# Patient Record
Sex: Male | Born: 1945 | Race: Black or African American | Hispanic: No | Smoking: Never smoker
Health system: Southern US, Community
[De-identification: ages and names within clinical notes are randomized; demographics above are authoritative.]

## PROBLEM LIST (undated history)

## (undated) DIAGNOSIS — I10 Essential (primary) hypertension: Secondary | ICD-10-CM

## (undated) DIAGNOSIS — E119 Type 2 diabetes mellitus without complications: Secondary | ICD-10-CM

## (undated) DIAGNOSIS — N529 Male erectile dysfunction, unspecified: Secondary | ICD-10-CM

## (undated) DIAGNOSIS — I350 Nonrheumatic aortic (valve) stenosis: Secondary | ICD-10-CM

## (undated) DIAGNOSIS — I2510 Atherosclerotic heart disease of native coronary artery without angina pectoris: Secondary | ICD-10-CM

## (undated) DIAGNOSIS — G5600 Carpal tunnel syndrome, unspecified upper limb: Secondary | ICD-10-CM

## (undated) DIAGNOSIS — U071 COVID-19: Secondary | ICD-10-CM

## (undated) DIAGNOSIS — R002 Palpitations: Secondary | ICD-10-CM

## (undated) DIAGNOSIS — E785 Hyperlipidemia, unspecified: Secondary | ICD-10-CM

## (undated) DIAGNOSIS — Z5189 Encounter for other specified aftercare: Secondary | ICD-10-CM

## (undated) HISTORY — DX: Hyperlipidemia, unspecified: E78.5

## (undated) HISTORY — DX: Carpal tunnel syndrome, unspecified upper limb: G56.00

## (undated) HISTORY — DX: Palpitations: R00.2

## (undated) HISTORY — PX: CYST REMOVAL HAND: SHX6279

## (undated) HISTORY — DX: Encounter for other specified aftercare: Z51.89

## (undated) HISTORY — DX: Male erectile dysfunction, unspecified: N52.9

## (undated) HISTORY — DX: Nonrheumatic aortic (valve) stenosis: I35.0

## (undated) HISTORY — DX: Type 2 diabetes mellitus without complications: E11.9

---

## 2001-07-16 ENCOUNTER — Emergency Department (HOSPITAL_COMMUNITY): Admission: EM | Admit: 2001-07-16 | Discharge: 2001-07-16 | Payer: Self-pay | Admitting: Emergency Medicine

## 2001-07-16 ENCOUNTER — Encounter: Payer: Self-pay | Admitting: Emergency Medicine

## 2004-08-02 ENCOUNTER — Encounter: Admission: RE | Admit: 2004-08-02 | Discharge: 2004-08-02 | Payer: Self-pay | Admitting: Occupational Medicine

## 2007-11-29 ENCOUNTER — Ambulatory Visit: Payer: Self-pay | Admitting: Internal Medicine

## 2007-11-29 DIAGNOSIS — I10 Essential (primary) hypertension: Secondary | ICD-10-CM | POA: Insufficient documentation

## 2007-11-29 DIAGNOSIS — F528 Other sexual dysfunction not due to a substance or known physiological condition: Secondary | ICD-10-CM | POA: Insufficient documentation

## 2007-11-29 DIAGNOSIS — IMO0002 Reserved for concepts with insufficient information to code with codable children: Secondary | ICD-10-CM | POA: Insufficient documentation

## 2007-11-29 DIAGNOSIS — E1165 Type 2 diabetes mellitus with hyperglycemia: Secondary | ICD-10-CM

## 2007-11-29 DIAGNOSIS — E1149 Type 2 diabetes mellitus with other diabetic neurological complication: Secondary | ICD-10-CM

## 2007-11-29 LAB — CONVERTED CEMR LAB
ALT: 39 units/L (ref 0–53)
AST: 36 units/L (ref 0–37)
Albumin: 4.3 g/dL (ref 3.5–5.2)
Alkaline Phosphatase: 77 units/L (ref 39–117)
BUN: 9 mg/dL (ref 6–23)
Basophils Absolute: 0 10*3/uL (ref 0.0–0.1)
Basophils Relative: 0.1 % (ref 0.0–3.0)
Bilirubin, Direct: 0.1 mg/dL (ref 0.0–0.3)
CO2: 30 meq/L (ref 19–32)
Calcium: 9.4 mg/dL (ref 8.4–10.5)
Chloride: 98 meq/L (ref 96–112)
Creatinine, Ser: 0.9 mg/dL (ref 0.4–1.5)
Creatinine,U: 134.2 mg/dL
Eosinophils Absolute: 0 10*3/uL (ref 0.0–0.7)
Eosinophils Relative: 0.4 % (ref 0.0–5.0)
GFR calc Af Amer: 111 mL/min
GFR calc non Af Amer: 91 mL/min
Glucose, Bld: 265 mg/dL — ABNORMAL HIGH (ref 70–99)
HCT: 43.4 % (ref 39.0–52.0)
Hemoglobin: 14.9 g/dL (ref 13.0–17.0)
Hgb A1c MFr Bld: 10.8 % — ABNORMAL HIGH (ref 4.6–6.0)
Lymphocytes Relative: 33.3 % (ref 12.0–46.0)
MCHC: 34.4 g/dL (ref 30.0–36.0)
MCV: 92.7 fL (ref 78.0–100.0)
Microalb Creat Ratio: 76.8 mg/g — ABNORMAL HIGH (ref 0.0–30.0)
Microalb, Ur: 10.3 mg/dL — ABNORMAL HIGH (ref 0.0–1.9)
Monocytes Absolute: 0.5 10*3/uL (ref 0.1–1.0)
Monocytes Relative: 7.4 % (ref 3.0–12.0)
Neutro Abs: 4.4 10*3/uL (ref 1.4–7.7)
Neutrophils Relative %: 58.8 % (ref 43.0–77.0)
Platelets: 181 10*3/uL (ref 150–400)
Potassium: 4.3 meq/L (ref 3.5–5.1)
RBC: 4.69 M/uL (ref 4.22–5.81)
RDW: 12.2 % (ref 11.5–14.6)
Sodium: 136 meq/L (ref 135–145)
TSH: 1.52 microintl units/mL (ref 0.35–5.50)
Total Bilirubin: 0.8 mg/dL (ref 0.3–1.2)
Total Protein: 7.9 g/dL (ref 6.0–8.3)
WBC: 7.4 10*3/uL (ref 4.5–10.5)

## 2007-12-05 ENCOUNTER — Ambulatory Visit: Payer: Self-pay | Admitting: Internal Medicine

## 2007-12-05 LAB — CONVERTED CEMR LAB
CRP, High Sensitivity: <1 — ABNORMAL LOW (ref 0.00–5.00)
Cholesterol: 264 mg/dL (ref 0–200)
Direct LDL: 137.8 mg/dL
HDL: 85.3 mg/dL (ref >39.0)
Total CHOL/HDL Ratio: 3.1
Triglycerides: 73 mg/dL (ref 0–149)
VLDL: 15 mg/dL (ref 0–40)

## 2007-12-13 ENCOUNTER — Ambulatory Visit: Payer: Self-pay | Admitting: Internal Medicine

## 2007-12-13 DIAGNOSIS — E785 Hyperlipidemia, unspecified: Secondary | ICD-10-CM | POA: Insufficient documentation

## 2008-01-07 ENCOUNTER — Ambulatory Visit: Payer: Self-pay | Admitting: Internal Medicine

## 2008-01-07 LAB — CONVERTED CEMR LAB
ALT: 28 units/L (ref 0–53)
AST: 29 units/L (ref 0–37)
BUN: 15 mg/dL (ref 6–23)
CO2: 28 meq/L (ref 19–32)
Calcium: 9.2 mg/dL (ref 8.4–10.5)
Chloride: 100 meq/L (ref 96–112)
Cholesterol: 207 mg/dL (ref 0–200)
Creatinine, Ser: 0.9 mg/dL (ref 0.4–1.5)
Direct LDL: 85.4 mg/dL
GFR calc Af Amer: 111 mL/min
GFR calc non Af Amer: 91 mL/min
Glucose, Bld: 238 mg/dL — ABNORMAL HIGH (ref 70–99)
HDL: 99.5 mg/dL (ref >39.0)
Potassium: 4 meq/L (ref 3.5–5.1)
Sodium: 135 meq/L (ref 135–145)
Total CHOL/HDL Ratio: 2.1
Triglycerides: 59 mg/dL (ref 0–149)
VLDL: 12 mg/dL (ref 0–40)

## 2008-01-13 ENCOUNTER — Ambulatory Visit: Payer: Self-pay | Admitting: Internal Medicine

## 2008-02-25 ENCOUNTER — Encounter: Payer: Self-pay | Admitting: Internal Medicine

## 2008-04-10 ENCOUNTER — Ambulatory Visit: Payer: Self-pay | Admitting: Internal Medicine

## 2008-04-13 ENCOUNTER — Encounter (INDEPENDENT_AMBULATORY_CARE_PROVIDER_SITE_OTHER): Payer: Self-pay | Admitting: *Deleted

## 2008-04-21 ENCOUNTER — Encounter: Payer: Self-pay | Admitting: Internal Medicine

## 2008-06-05 ENCOUNTER — Encounter: Payer: Self-pay | Admitting: Internal Medicine

## 2008-06-09 ENCOUNTER — Encounter: Payer: Self-pay | Admitting: Internal Medicine

## 2008-11-30 ENCOUNTER — Encounter: Payer: Self-pay | Admitting: Adult Health

## 2009-03-15 ENCOUNTER — Ambulatory Visit: Payer: Self-pay | Admitting: Internal Medicine

## 2009-03-15 DIAGNOSIS — N138 Other obstructive and reflux uropathy: Secondary | ICD-10-CM | POA: Insufficient documentation

## 2009-03-15 DIAGNOSIS — N401 Enlarged prostate with lower urinary tract symptoms: Secondary | ICD-10-CM | POA: Insufficient documentation

## 2009-03-15 DIAGNOSIS — R9431 Abnormal electrocardiogram [ECG] [EKG]: Secondary | ICD-10-CM | POA: Insufficient documentation

## 2009-03-15 LAB — CONVERTED CEMR LAB
ALT: 26 units/L (ref 0–53)
AST: 24 units/L (ref 0–37)
Albumin: 4.5 g/dL (ref 3.5–5.2)
Alkaline Phosphatase: 66 units/L (ref 39–117)
BUN: 10 mg/dL (ref 6–23)
Basophils Absolute: 0 10*3/uL (ref 0.0–0.1)
Basophils Relative: 0.2 % (ref 0.0–3.0)
Bilirubin Urine: NEGATIVE
Bilirubin, Direct: 0.1 mg/dL (ref 0.0–0.3)
CO2: 30 meq/L (ref 19–32)
Calcium: 9.7 mg/dL (ref 8.4–10.5)
Chloride: 100 meq/L (ref 96–112)
Cholesterol, target level: 200 mg/dL
Cholesterol: 253 mg/dL — ABNORMAL HIGH (ref 0–200)
Creatinine, Ser: 0.9 mg/dL (ref 0.4–1.5)
Creatinine,U: 217.3 mg/dL
Direct LDL: 145.7 mg/dL
Eosinophils Absolute: 0 10*3/uL (ref 0.0–0.7)
Eosinophils Relative: 0.7 % (ref 0.0–5.0)
GFR calc non Af Amer: 110.07 mL/min (ref >60)
Glucose, Bld: 242 mg/dL — ABNORMAL HIGH (ref 70–99)
HCT: 42.2 % (ref 39.0–52.0)
HDL goal, serum: 40 mg/dL
HDL: 98.6 mg/dL (ref >39.00)
Hemoglobin, Urine: NEGATIVE
Hemoglobin: 13.9 g/dL (ref 13.0–17.0)
Hgb A1c MFr Bld: 10.6 % — ABNORMAL HIGH (ref 4.6–6.5)
Ketones, ur: NEGATIVE mg/dL
LDL Goal: 100 mg/dL
Leukocytes, UA: NEGATIVE
Lymphocytes Relative: 41.6 % (ref 12.0–46.0)
Lymphs Abs: 3 10*3/uL (ref 0.7–4.0)
MCHC: 33 g/dL (ref 30.0–36.0)
MCV: 95.4 fL (ref 78.0–100.0)
Microalb Creat Ratio: 43.3 mg/g — ABNORMAL HIGH (ref 0.0–30.0)
Microalb, Ur: 9.4 mg/dL — ABNORMAL HIGH (ref 0.0–1.9)
Monocytes Absolute: 0.5 10*3/uL (ref 0.1–1.0)
Monocytes Relative: 7 % (ref 3.0–12.0)
Neutro Abs: 3.6 10*3/uL (ref 1.4–7.7)
Neutrophils Relative %: 50.5 % (ref 43.0–77.0)
Nitrite: NEGATIVE
PSA: 0.64 ng/mL (ref 0.10–4.00)
Platelets: 190 10*3/uL (ref 150.0–400.0)
Potassium: 4.2 meq/L (ref 3.5–5.1)
RBC: 4.42 M/uL (ref 4.22–5.81)
RDW: 12.3 % (ref 11.5–14.6)
Sodium: 136 meq/L (ref 135–145)
Specific Gravity, Urine: >=1.03 (ref 1.000–1.030)
TSH: 2.31 microintl units/mL (ref 0.35–5.50)
Total Bilirubin: 0.7 mg/dL (ref 0.3–1.2)
Total CHOL/HDL Ratio: 3
Total Protein: 7.7 g/dL (ref 6.0–8.3)
Triglycerides: 66 mg/dL (ref 0.0–149.0)
Urine Glucose: 500 mg/dL
Urobilinogen, UA: 0.2 (ref 0.0–1.0)
VLDL: 13.2 mg/dL (ref 0.0–40.0)
WBC: 7.1 10*3/uL (ref 4.5–10.5)
pH: 5 (ref 5.0–8.0)

## 2009-03-16 ENCOUNTER — Encounter: Payer: Self-pay | Admitting: Internal Medicine

## 2009-03-16 ENCOUNTER — Telehealth (INDEPENDENT_AMBULATORY_CARE_PROVIDER_SITE_OTHER): Payer: Self-pay | Admitting: *Deleted

## 2009-03-22 ENCOUNTER — Telehealth (INDEPENDENT_AMBULATORY_CARE_PROVIDER_SITE_OTHER): Payer: Self-pay

## 2009-03-23 ENCOUNTER — Encounter (HOSPITAL_COMMUNITY): Admission: RE | Admit: 2009-03-23 | Discharge: 2009-05-19 | Payer: Self-pay | Admitting: Internal Medicine

## 2009-03-23 ENCOUNTER — Ambulatory Visit: Payer: Self-pay | Admitting: Cardiology

## 2009-03-23 ENCOUNTER — Ambulatory Visit: Payer: Self-pay

## 2009-03-24 ENCOUNTER — Encounter: Payer: Self-pay | Admitting: Internal Medicine

## 2009-05-18 ENCOUNTER — Encounter (INDEPENDENT_AMBULATORY_CARE_PROVIDER_SITE_OTHER): Payer: Self-pay | Admitting: *Deleted

## 2009-05-18 ENCOUNTER — Ambulatory Visit: Payer: Self-pay | Admitting: Internal Medicine

## 2009-05-30 ENCOUNTER — Emergency Department (HOSPITAL_COMMUNITY): Admission: EM | Admit: 2009-05-30 | Discharge: 2009-05-30 | Payer: Self-pay | Admitting: Emergency Medicine

## 2009-07-13 ENCOUNTER — Encounter: Payer: Self-pay | Admitting: Internal Medicine

## 2009-07-21 ENCOUNTER — Encounter: Payer: Self-pay | Admitting: Internal Medicine

## 2009-07-27 ENCOUNTER — Ambulatory Visit: Payer: Self-pay | Admitting: Internal Medicine

## 2009-08-30 ENCOUNTER — Ambulatory Visit: Payer: Self-pay | Admitting: Internal Medicine

## 2009-08-30 LAB — CONVERTED CEMR LAB
ALT: 22 units/L (ref 0–53)
Alkaline Phosphatase: 73 units/L (ref 39–117)
BUN: 13 mg/dL (ref 6–23)
Bilirubin, Direct: 0.1 mg/dL (ref 0.0–0.3)
Cholesterol: 232 mg/dL — ABNORMAL HIGH (ref 0–200)
Creatinine, Ser: 0.9 mg/dL (ref 0.4–1.5)
GFR calc non Af Amer: 105.82 mL/min (ref >60)
HDL: 115.3 mg/dL (ref >39.00)
Hgb A1c MFr Bld: 9.9 % — ABNORMAL HIGH (ref 4.6–6.5)
Total Protein: 8.2 g/dL (ref 6.0–8.3)

## 2009-12-27 ENCOUNTER — Ambulatory Visit: Payer: Self-pay | Admitting: Internal Medicine

## 2009-12-27 ENCOUNTER — Encounter: Payer: Self-pay | Admitting: Internal Medicine

## 2009-12-27 DIAGNOSIS — R079 Chest pain, unspecified: Secondary | ICD-10-CM | POA: Insufficient documentation

## 2009-12-27 LAB — CONVERTED CEMR LAB
ALT: 27 units/L (ref 0–53)
AST: 36 units/L (ref 0–37)
BUN: 11 mg/dL (ref 6–23)
Bilirubin, Direct: 0.1 mg/dL (ref 0.0–0.3)
Calcium: 9.4 mg/dL (ref 8.4–10.5)
Creatinine, Ser: 1 mg/dL (ref 0.4–1.5)
Eosinophils Relative: 0.8 % (ref 0.0–5.0)
GFR calc non Af Amer: 101.91 mL/min (ref >60)
Hemoglobin, Urine: NEGATIVE
Hgb A1c MFr Bld: 9.5 % — ABNORMAL HIGH (ref 4.6–6.5)
Ketones, ur: NEGATIVE mg/dL
Leukocytes, UA: NEGATIVE
Monocytes Absolute: 1 10*3/uL (ref 0.1–1.0)
Monocytes Relative: 11.3 % (ref 3.0–12.0)
Neutrophils Relative %: 60.5 % (ref 43.0–77.0)
Platelets: 212 10*3/uL (ref 150.0–400.0)
Pro B Natriuretic peptide (BNP): 103.2 pg/mL — ABNORMAL HIGH (ref 0.0–100.0)
Relative Index: 1.3 (ref 0.0–2.5)
Specific Gravity, Urine: >=1.03 (ref 1.000–1.030)
Total Bilirubin: 0.6 mg/dL (ref 0.3–1.2)
Urobilinogen, UA: 2 (ref 0.0–1.0)
WBC: 8.4 10*3/uL (ref 4.5–10.5)

## 2009-12-28 ENCOUNTER — Encounter: Payer: Self-pay | Admitting: Internal Medicine

## 2010-01-03 ENCOUNTER — Ambulatory Visit: Payer: Self-pay | Admitting: Internal Medicine

## 2010-01-20 ENCOUNTER — Telehealth: Payer: Self-pay | Admitting: Internal Medicine

## 2010-01-31 ENCOUNTER — Telehealth: Payer: Self-pay | Admitting: Internal Medicine

## 2010-02-02 ENCOUNTER — Ambulatory Visit: Payer: Self-pay | Admitting: Internal Medicine

## 2010-03-15 NOTE — Letter (Signed)
Summary: Lipid Letter  [facility]  [address] [place], [place] [address]   Phone: [phone]  Fax: [phone]    08/30/2009  Torre [place]umb[place] [address] [place], [place]  [address]  Dear [name]:  We have carefully reviewed your last lipid profile from 01/07/2008 and the results are noted below with a summary of recommendations for lipid management.    Cholesterol:       232     Goal: <200   HDL "good" Cholesterol:   578.46     Goal: >40   LDL "bad" Cholesterol:   109     Goal: <100   Triglycerides:       80.0     Goal: <150    YOUR BLOOD SUGARS ARE TOO HIGH-PLEASE FOLLOW-UP SOON.    TLC Diet (Therapeutic Lifestyle Change): Saturated Fats & Transfatty acids should be kept < 7% of total calories ***Reduce Saturated Fats Polyunstaurated Fat can be up to 10% of total calories Monounsaturated Fat Fat can be up to 20% of total calories Total Fat should be no greater than 25-35% of total calories Carbohydrates should be 50-60% of total calories Protein should be approximately 15% of total calories Fiber should be at least 20-30 grams a day ***Increased fiber may help lower LDL Total Cholesterol should be < 200mg /day Consider adding plant stanol/sterols to diet (example: Benacol spread) ***A higher intake of unsaturated fat may reduce Triglycerides and Increase HDL    Adjunctive Measures (may lower LIPIDS and reduce risk of Heart Attack) include: Aerobic Exercise (20-30 minutes 3-4 times a week) Limit Alcohol Consumption Weight Reduction Aspirin 75-81 mg a day by mouth (if not allergic or contraindicated) Dietary Fiber 20-30 grams a day by mouth     Current Medications: 1)    Freestyle Lite Test  Strp (Glucose blood) .... Test blood sugar daily before am meal 2)    Aspirin Ec 81 Mg Tbec (Aspirin) .... 2 by mouth once daily 3)    Lisinopril 10 Mg Tabs (Lisinopril) .... One by mouth once daily 4)    Bd U/f Short Pen Needle 31g X 8 Mm Misc (Insulin pen  needle) .... For once daily use 5)    Glimepiride 1 Mg Tabs (Glimepiride) .... One by mouth qd 6)    Crestor 10 Mg Tabs (Rosuvastatin calcium) .... One by mouth once daily for cholesterol 7)    Janumet 50-1000 Mg Tabs (Sitagliptin-metformin hcl) .... One by mouth two times a day for diabetes 8)    Cialis 5 Mg Tabs (Tadalafil) .... One by mouth once daily  If you have any questions, please call. We appreciate being able to work with you.   Sincerely,    [facility] Etta Grandchild MD

## 2010-03-15 NOTE — Letter (Signed)
Summary: Results Follow-up Letter  [facility]  [address] [place], [place] [address]   Phone: [phone]  Fax: [phone]    03/24/2009  [address] [place], [place]  [address]  Dear Mr. Viscuso,   The following are the results of your recent test(s):  Test     Result     Heart test     normal   _________________________________________________________  Please call for an appointment as directed _________________________________________________________ _________________________________________________________ _________________________________________________________  Sincerely,  Sanda Linger MD [facility]

## 2010-03-15 NOTE — Assessment & Plan Note (Signed)
Summary: CHEST PAIN FEW DAYS W/LIGHT HEADEDNESS/CD   Vital Signs:  Patient profile:   65 year old male Height:      72 inches Weight:      189 pounds BMI:     25.73 O2 Sat:      97 % on Room air Temp:     98.6 degrees F oral Pulse rate:   75 / minute Pulse rhythm:   regular Resp:     16 per minute BP sitting:   140 / 80  (left arm) Cuff size:   small  Vitals Entered By: Rock Nephew CMA (December 27, 2009 3:25 PM)  Nutrition Counseling: Patient's BMI is greater than 25 and therefore counseled on weight management options.  O2 Flow:  Room air CC: Patient c/o chest, should and r side rib pain, Hypertension Management, Lipid Management   Primary Care Provider:  Etta Grandchild MD  CC:  Patient c/o chest, should and r side rib pain, Hypertension Management, and Lipid Management.  History of Present Illness:       This is a 65 year old male who presents with Chest pain.  The symptoms began 5 days ago.  On a scale of 1 to 10, the intensity is described as a 2.  The patient reports resting chest pain and shortness of breath, but denies exertional chest pain, nausea, vomiting, diaphoresis, palpitations, dizziness, syncope, and indigestion.  The pain is described as constant and heavy.  The pain is located in the left anterior chest.  Episodes of chest pain last >30 minutes.  The pain is brought on or made worse by coughing.  The pain is relieved or improved with change in position.  The pain radiates into his left lateral chest wall and down to his left abdomen.  Hypertension History:      He denies headache, palpitations, orthopnea, PND, peripheral edema, visual symptoms, neurologic problems, syncope, and side effects from treatment.  He notes no problems with any antihypertensive medication side effects.        Positive major cardiovascular risk factors include male age 25 years old or older, diabetes, hyperlipidemia, and hypertension.  Negative major cardiovascular risk factors  include negative family history for ischemic heart disease and non-tobacco-user status.        Further assessment for target organ damage reveals no history of ASHD, cardiac end-organ damage (CHF/LVH), stroke/TIA, peripheral vascular disease, renal insufficiency, or hypertensive retinopathy.    Lipid Management History:      Positive NCEP/ATP III risk factors include male age 40 years old or older, diabetes, and hypertension.  Negative NCEP/ATP III risk factors include HDL cholesterol greater than 60, no family history for ischemic heart disease, non-tobacco-user status, no ASHD (atherosclerotic heart disease), no prior stroke/TIA, no peripheral vascular disease, and no history of aortic aneurysm.        The patient states that he knows about the "Therapeutic Lifestyle Change" diet.  His compliance with the TLC diet is not at all.  The patient expresses understanding of adjunctive measures for cholesterol lowering.  Adjunctive measures started by the patient include aerobic exercise, fiber, ASA, omega-3 supplements, limit alcohol consumpton, and weight reduction.  He expresses no side effects from his lipid-lowering medication.  The patient denies any symptoms to suggest myopathy or liver disease.      Current Medications (verified): 1)  Freestyle Lite Test  Strp (Glucose Blood) .... Test Blood Sugar Daily Before Am Meal 2)  Aspirin Ec 81 Mg Tbec (Aspirin) .[name] KitchenMarland KitchenMarland Kitchen  2 By Mouth Once Daily 3)  Lisinopril 10 Mg Tabs (Lisinopril) .... One By Mouth Once Daily 4)  Bd U/f Short Pen Needle 31g X 8 Mm Misc (Insulin Pen Needle) .... For Once Daily Use 5)  Glimepiride 1 Mg Tabs (Glimepiride) .... One By Mouth Qd 6)  Crestor 10 Mg Tabs (Rosuvastatin Calcium) .... One By Mouth Once Daily For Cholesterol 7)  Janumet 50-1000 Mg Tabs (Sitagliptin-Metformin Hcl) .... One By Mouth Two Times A Day For Diabetes 8)  Cialis 5 Mg Tabs (Tadalafil) .... One By Mouth Once Daily  Allergies (verified): No Known Drug  Allergies  Past History:  Past Medical History: Last updated: 04/10/2008 Diabetes mellitus, type II uncontrolled Hypertension Hyperlipidemia  Erectile dysfunction.   Past Surgical History: Last updated: 03/15/2009 Denies surgical history  Family History: Last updated: 04/10/2008 Thoracic aneurysm - mother (deceased) Throat cancer - father (deceased)   Social History: Last updated: 04/10/2008 Married to [name] for 37 yrs. 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 Thoracic aneurysm - mother (deceased) Throat cancer - father (deceased)   Social History: Reviewed history from 04/10/2008 and no changes required. Married to [name] for 37 yrs. 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   Endo:  Denies cold intolerance, excessive hunger, excessive thirst, excessive urination, heat intolerance, polyuria, and weight change.  Physical Exam  General:  alert, well-developed, well-nourished, well-hydrated, appropriate dress, normal appearance, healthy-appearing, cooperative to examination, and good hygiene.   Head:  normocephalic, atraumatic, no abnormalities observed, and no abnormalities palpated.   Eyes:  vision grossly intact, pupils equal, pupils round, and pupils reactive to light.   Mouth:  Oral mucosa and oropharynx without lesions or exudates.  Teeth in good repair. Neck:  supple, full  ROM, no masses, no thyromegaly, no thyroid nodules or tenderness, no JVD, no HJR, normal carotid upstroke, and no carotid bruits.   Chest Wall:  No deformities, masses, tenderness or gynecomastia noted. Breasts:  No masses or gynecomastia noted Lungs:  normal respiratory effort, no intercostal retractions, no accessory muscle use, normal breath sounds, no dullness, no fremitus, no crackles, and no wheezes.   Heart:  Normal rate and regular rhythm. S1 and S2 normal without gallop,+ 1/6 SEM over LLSB,  no click, rub or other extra sounds. Abdomen:  soft, non-tender, normal bowel sounds, no distention, no masses, no guarding, no rigidity, no rebound tenderness, no abdominal hernia, no inguinal hernia, no hepatomegaly, and no splenomegaly.   Msk:  normal ROM, no joint tenderness, no joint swelling, no joint warmth, no redness over joints, no joint deformities, no joint instability, and no crepitation.   Pulses:  R and L carotid,radial,femoral,dorsalis pedis and posterior tibial pulses are full and equal bilaterally Extremities:  No clubbing, cyanosis, edema, or deformity noted with normal full range of motion of all joints.   Neurologic:  No cranial nerve deficits noted. Station and gait are normal. Plantar reflexes are down-going bilaterally. DTRs are symmetrical throughout. Sensory, motor and coordinative functions appear intact. Skin:  Intact without suspicious lesions or  rashes Cervical Nodes:  no anterior cervical adenopathy and no posterior cervical adenopathy.   Axillary Nodes:  no R axillary adenopathy and no L axillary adenopathy.   Psych:  Cognition and judgment appear intact. Alert and cooperative with normal attention span and concentration. No apparent delusions, illusions, hallucinations Additional Exam:  His EKG has no significant change from prior EKG  Diabetes Management Exam:    Foot Exam (with socks and/or shoes not present):       Sensory-Pinprick/Light touch:          Left medial  foot (L-4): normal          Left dorsal foot (L-5): normal          Left lateral foot (S-1): normal          Right medial foot (L-4): normal          Right dorsal foot (L-5): normal          Right lateral foot (S-1): normal       Sensory-Monofilament:          Left foot: normal          Right foot: normal       Inspection:          Left foot: normal          Right foot: normal       Nails:          Left foot: normal          Right foot: normal   Impression & Recommendations:  Problem # 1:  CHEST PAIN (ICD-786.50) Assessment New this pain sounds atypical and he had a normal cardiolite earlier this year so I don't think the pain is cardiac but will check cardiac enzymes and a chest xray  for AAA , labs for PE and look for other causes as well. Orders: T-2 View CXR (71020TC) Venipuncture (65784) TLB-BMP (Basic Metabolic Panel-BMET) (80048-METABOL) TLB-CBC Platelet - w/Differential (85025-CBCD) TLB-Hepatic/Liver Function Pnl (80076-HEPATIC) TLB-TSH (Thyroid Stimulating Hormone) (84443-TSH) TLB-BNP (B-Natriuretic Peptide) (83880-BNPR) TLB-Cardiac Panel (69629_52841-LKGM) TLB-A1C / Hgb A1C (Glycohemoglobin) (83036-A1C) TLB-Udip ONLY (81003-UDIP) T-D-Dimer Fibrin Derivatives Quantitive (01027-25366) EKG w/ Interpretation (93000)  Problem # 2:  ELECTROCARDIOGRAM, ABNORMAL (ICD-794.31) Assessment: Unchanged  Orders: EKG w/ Interpretation (93000)  Problem # 3:  DIABETES MELLITUS, TYPE II, UNCONTROLLED (ICD-250.02) Assessment: Unchanged  His updated medication list for this problem includes:    Aspirin Ec 81 Mg Tbec (Aspirin) .[name] Kitchen... 2 by mouth once daily    Lisinopril 10 Mg Tabs (Lisinopril) ..... One by mouth once daily    Glimepiride 1 Mg Tabs (Glimepiride) ..... One by mouth qd    Janumet 50-1000 Mg Tabs (Sitagliptin-metformin hcl) ..... One by mouth two times a day for diabetes  Orders: Venipuncture (44034) TLB-BMP (Basic Metabolic Panel-BMET) (80048-METABOL) TLB-CBC  Platelet - w/Differential (85025-CBCD) TLB-Hepatic/Liver Function Pnl (80076-HEPATIC) TLB-TSH (Thyroid Stimulating Hormone) (84443-TSH) TLB-BNP (B-Natriuretic Peptide) (83880-BNPR) TLB-Cardiac Panel ([id number]-FIEP) TLB-A1C / Hgb A1C (Glycohemoglobin) (83036-A1C) TLB-Udip ONLY (81003-UDIP) T-D-Dimer Fibrin Derivatives Quantitive [phone])  Labs Reviewed: Creat: 0.9 (08/30/2009)     Last Eye Exam: mild diabetic retinopathy (07/13/2009) Reviewed HgBA1c results: 9.9 (08/30/2009)  10.6 (03/15/2009)  Problem # 4:  HYPERTENSION (ICD-401.9) Assessment: Unchanged  His updated medication list for this problem includes:    Lisinopril 10 Mg Tabs (Lisinopril) ..... One by mouth once daily  Orders: Venipuncture (60630) TLB-BMP (Basic Metabolic Panel-BMET) (80048-METABOL) TLB-CBC Platelet - w/Differential (85025-CBCD) TLB-Hepatic/Liver Function Pnl (80076-HEPATIC) TLB-TSH (Thyroid Stimulating Hormone) (84443-TSH) TLB-BNP (B-Natriuretic Peptide) (  83880-BNPR) TLB-Cardiac Panel ([phone]) TLB-A1C / Hgb A1C (Glycohemoglobin) (83036-A1C) TLB-Udip ONLY (81003-UDIP) T-D-Dimer Fibrin Derivatives Quantitive (41324-40102)  BP today: 140/80 Prior BP: 140/88 (08/30/2009)  Prior 10 Yr Risk Heart Disease: Not enough information (03/15/2009)  Labs Reviewed: K+: 4.7 (08/30/2009) Creat: : 0.9 (08/30/2009)   Chol: 232 (08/30/2009)   HDL: 115.30 (08/30/2009)   LDL: DEL (01/07/2008)   TG: 80.0 (08/30/2009)  Problem # 5:  HYPERLIPIDEMIA (ICD-272.4) Assessment: Unchanged  The following medications were removed from the medication list:    Crestor 10 Mg Tabs (Rosuvastatin calcium) ..... One by mouth once daily for cholesterol His updated medication list for this problem includes:    Vytorin 10-40 Mg Tabs (Ezetimibe-simvastatin) ..... One by mouth once daily for cholesterol  Orders: Venipuncture (72536) TLB-BMP (Basic Metabolic Panel-BMET) (80048-METABOL) TLB-CBC Platelet -  w/Differential (85025-CBCD) TLB-Hepatic/Liver Function Pnl (80076-HEPATIC) TLB-TSH (Thyroid Stimulating Hormone) (84443-TSH) TLB-BNP (B-Natriuretic Peptide) (83880-BNPR) TLB-Cardiac Panel ([id number]-ZDGL) TLB-A1C / Hgb A1C (Glycohemoglobin) (83036-A1C) TLB-Udip ONLY (81003-UDIP) T-D-Dimer Fibrin Derivatives Quantitive (87564-33295)  Complete Medication List: 1)  Freestyle Lite Test Strp (Glucose blood) .... Test blood sugar daily before am meal 2)  Aspirin Ec 81 Mg Tbec (Aspirin) .... 2 by mouth once daily 3)  Lisinopril 10 Mg Tabs (Lisinopril) .... One by mouth once daily 4)  Bd U/f Short Pen Needle 31g X 8 Mm Misc (Insulin pen needle) .... For once daily use 5)  Glimepiride 1 Mg Tabs (Glimepiride) .... One by mouth qd 6)  Janumet 50-1000 Mg Tabs (Sitagliptin-metformin hcl) .... One by mouth two times a day for diabetes 7)  Cialis 5 Mg Tabs (Tadalafil) .... One by mouth once daily 8)  Vytorin 10-40 Mg Tabs (Ezetimibe-simvastatin) .... One by mouth once daily for cholesterol  Hypertension Assessment/Plan:      The patient's hypertensive risk group is category C: Target organ damage and/or diabetes.  Today's blood pressure is 140/80.  His blood pressure goal is < 130/80.  Lipid Assessment/Plan:      Based on NCEP/ATP III, the patient's risk factor category is "history of diabetes".  The patient's lipid goals are as follows: Total cholesterol goal is 200; LDL cholesterol goal is 100; HDL cholesterol goal is 40; Triglyceride goal is 150.    Patient Instructions: 1)  Please schedule a follow-up appointment in 2 weeks. 2)  Check your blood sugars regularly. If your readings are usually above 200 or below 70 you should contact our office. 3)  It is important that your Diabetic A1c level is checked every 3 months. 4)  See your eye doctor yearly to check for diabetic eye damage. 5)  Check your feet each night for sore areas, calluses or signs of infection. 6)  Check your Blood Pressure  regularly. If it is above 130/80: you should make an appointment. Prescriptions: VYTORIN 10-40 MG TABS (EZETIMIBE-SIMVASTATIN) One by mouth once daily for cholesterol  #168 x 0   Entered and Authorized by:   Etta Grandchild MD   Signed by:   Etta Grandchild MD on 12/27/2009   Method used:   Samples Given   RxID:   [id number]    Orders Added: 1)  T-2 View CXR [71020TC] 2)  Venipuncture [36415] 3)  TLB-BMP (Basic Metabolic Panel-BMET) [80048-METABOL] 4)  TLB-CBC Platelet - w/Differential [85025-CBCD] 5)  TLB-Hepatic/Liver Function Pnl [80076-HEPATIC] 6)  TLB-TSH (Thyroid Stimulating Hormone) [84443-TSH] 7)  TLB-BNP (B-Natriuretic Peptide) [83880-BNPR] 8)  TLB-Cardiac Panel [82550_82553-CARD] 9)  TLB-A1C / Hgb A1C (Glycohemoglobin) [83036-A1C] 10)  TLB-Udip ONLY [81003-UDIP] 11)  T-D-Dimer Fibrin Derivatives Quantitive [25956-38756] 12)  EKG w/ Interpretation [93000] 13)  Est. Patient Level V [43329]

## 2010-03-15 NOTE — Miscellaneous (Signed)
Summary: Eye Exam  Clinical Lists Changes  Observations: Added new observation of DMEYEEXAMNXT: 07/2010 (07/21/2009 16:16) Added new observation of DMEYEEXMRES: mild diabetic retinopathy (07/13/2009 16:17) Added new observation of EYE EXAM BY: [facility] (07/13/2009 16:17) Added new observation of DIAB EYE EX: mild diabetic retinopathy (07/13/2009 16:17)       Diabetes Management Exam:    Eye Exam:       Eye Exam done elsewhere          Date: 07/13/2009          Results: mild diabetic retinopathy          Done by: [facility]  Appended Document: Eye Exam

## 2010-03-15 NOTE — Assessment & Plan Note (Signed)
Summary: 4 MO ROV/NWS  #   Vital Signs:  Patient profile:   65 year old [place] Height:      72 inches Weight:      188 pounds BMI:     25.59 O2 Sat:      98 % on Room air Temp:     98.7 degrees F oral Pulse rate:   84 / minute Pulse rhythm:   regular Resp:     16 per minute BP sitting:   136 / 88  (left arm) Cuff size:   regular  Vitals Entered By: Lanier Prude, Beverly Gust) (January 03, 2010 3:13 PM)  Nutrition Counseling: Patient's BMI is greater than 25 and therefore counseled on weight management options.  O2 Flow:  Room air CC: f/u  Is Patient Diabetic? Yes Did you bring your meter with you today? No   Primary Care Provider:  Etta Grandchild MD  CC:  f/u .  History of Present Illness: He returns for f/up and complains of persistent nausea and dizziness.  Medications Prior to Update: 1)  Freestyle Lite Test  Strp (Glucose Blood) .... Test Blood Sugar Daily Before Am Meal 2)  Aspirin Ec 81 Mg Tbec (Aspirin) .... 2 By Mouth Once Daily 3)  Lisinopril 10 Mg Tabs (Lisinopril) .... One By Mouth Once Daily 4)  Bd U/f Short Pen Needle 31g X 8 Mm Misc (Insulin Pen Needle) .... For Once Daily Use 5)  Glimepiride 1 Mg Tabs (Glimepiride) .... One By Mouth Qd 6)  Janumet 50-1000 Mg Tabs (Sitagliptin-Metformin Hcl) .... One By Mouth Two Times A Day For Diabetes 7)  Cialis 5 Mg Tabs (Tadalafil) .... One By Mouth Once Daily 8)  Vytorin 10-40 Mg Tabs (Ezetimibe-Simvastatin) .... One By Mouth Once Daily For Cholesterol  Current Medications (verified): 1)  Aspirin Ec 81 Mg Tbec (Aspirin) .... 2 By Mouth Once Daily 2)  Lisinopril 10 Mg Tabs (Lisinopril) .... One By Mouth Once Daily 3)  Bd U/f Short Pen Needle 31g X 8 Mm Misc (Insulin Pen Needle) .... For Once Daily Use 4)  Glimepiride 1 Mg Tabs (Glimepiride) .... One By Mouth Qd 5)  Cialis 5 Mg Tabs (Tadalafil) .... One By Mouth Once Daily 6)  Vytorin 10-40 Mg Tabs (Ezetimibe-Simvastatin) .... One By Mouth Once Daily For  Cholesterol 7)  Januvia 100 Mg Tabs (Sitagliptin Phosphate) .... One By Mouth Once Daily For Diabetes 8)  Actos 30 Mg Tabs (Pioglitazone Hcl) .... One By Mouth Once Daily For Diabetes 9)  Levemir Flexpen 100 Unit/ml Soln (Insulin Detemir) .... 20 Unit Subcutaneously Once Daily 10)  Contour Blood Glucose System W/device Kit (Blood Glucose Monitoring Suppl) .... Use Two Times A Day As Directed 11)  Bayer Contour Test  Strp (Glucose Blood) .... Use Two Times A Day As Directed  Allergies (verified): No Known Drug Allergies  Past History:  Past Medical History: Last updated: 04/10/2008 Diabetes mellitus, type II uncontrolled Hypertension Hyperlipidemia  Erectile dysfunction.   Past Surgical History: Last updated: 03/15/2009 Denies surgical history  Family History: Last updated: 04/10/2008 Thoracic aneurysm - mother (deceased) Throat cancer - father (deceased)   Social History: Last updated: 04/10/2008 Married to [name] for 37 yrs. 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 Thoracic aneurysm - mother (deceased) Throat cancer - father (deceased)   Social  History: Reviewed history from 04/10/2008 and no changes required. Married to [name] for 37 yrs. 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   GI:  Complains of nausea; denies abdominal pain, change in bowel habits, constipation, diarrhea, excessive appetite, indigestion, loss of appetite, vomiting, and yellowish skin  color. Endo:  Denies cold intolerance, excessive hunger, excessive thirst, excessive urination, heat intolerance, polyuria, and weight change.  Physical Exam  General:  alert, well-developed, well-nourished, well-hydrated, appropriate dress, normal appearance, healthy-appearing, and overweight-appearing.   Head:  normocephalic, atraumatic, no abnormalities observed, and no abnormalities palpated.   Eyes:  vision grossly intact and no injection or icterus. Mouth:  Oral mucosa and oropharynx without lesions or exudates.  Teeth in good repair. Neck:  supple, full ROM, no masses, no thyromegaly, no JVD, normal carotid upstroke, no carotid bruits, and no cervical lymphadenopathy.   Lungs:  normal respiratory effort, no intercostal retractions, no accessory muscle use, normal breath sounds, no dullness, no fremitus, no crackles, and no wheezes.   Heart:  normal rate, regular rhythm, no murmur, no gallop, and no rub.   Abdomen:  soft, non-tender, normal bowel sounds, no distention, no masses, no guarding, no rigidity, no rebound tenderness, no abdominal hernia, no inguinal hernia, no hepatomegaly, and no splenomegaly.   Msk:  normal ROM, no joint tenderness, no joint swelling, no joint warmth, no redness over joints, no joint deformities, no joint instability, and no crepitation.   Pulses:  R and L carotid,radial,femoral,dorsalis pedis and posterior tibial pulses are full and equal bilaterally Extremities:  No clubbing, cyanosis, edema, or deformity noted with normal full range of motion of all joints.   Neurologic:  No cranial nerve deficits noted. Station and gait are normal. Plantar reflexes are down-going bilaterally. DTRs are symmetrical throughout. Sensory, motor and coordinative functions appear intact. Skin:  Intact without suspicious lesions or rashes Cervical Nodes:  no anterior cervical adenopathy and no posterior cervical adenopathy.   Psych:  Cognition and judgment appear intact. Alert and  cooperative with normal attention span and concentration. No apparent delusions, illusions, hallucinations  Diabetes Management Exam:    Foot Exam (with socks and/or shoes not present):       Sensory-Pinprick/Light touch:          Left medial foot (L-4): normal          Left dorsal foot (L-5): normal          Left lateral foot (S-1): normal          Right medial foot (L-4): normal          Right dorsal foot (L-5): normal          Right lateral foot (S-1): normal       Sensory-Monofilament:          Left foot: normal          Right foot: normal       Inspection:          Left foot: normal          Right foot: normal       Nails:          Left foot: normal          Right foot: normal   Impression &  Recommendations:  Problem # 1:  DIABETES MELLITUS, TYPE II, UNCONTROLLED (ICD-250.02) Assessment Deteriorated  I am concerned that the nausea may be due to metformin so I have discontinued that and will make changes in his regimen for treatment of DM. I instructed and demonstrated his first dose of Levemir today. The following medications were removed from the medication list:    Janumet 50-1000 Mg Tabs (Sitagliptin-metformin hcl) ..... One by mouth two times a day for diabetes His updated medication list for this problem includes:    Aspirin Ec 81 Mg Tbec (Aspirin) .[name] Kitchen... 2 by mouth once daily    Lisinopril 10 Mg Tabs (Lisinopril) ..... One by mouth once daily    Glimepiride 1 Mg Tabs (Glimepiride) ..... One by mouth qd    Januvia 100 Mg Tabs (Sitagliptin phosphate) ..... One by mouth once daily for diabetes    Actos 30 Mg Tabs (Pioglitazone hcl) ..... One by mouth once daily for diabetes    Levemir Flexpen 100 Unit/ml Soln (Insulin detemir) .[name] Kitchen... 20 unit subcutaneously once daily  Orders: Diabetic Clinic Referral (Diabetic)  Labs Reviewed: Creat: 1.0 (12/27/2009)     Last Eye Exam: mild diabetic retinopathy (07/13/2009) Reviewed HgBA1c results: 9.5 (12/27/2009)  9.9  (08/30/2009)  Problem # 2:  HYPERTENSION (ICD-401.9) Assessment: Improved  His updated medication list for this problem includes:    Lisinopril 10 Mg Tabs (Lisinopril) ..... One by mouth once daily  BP today: 136/88 Prior BP: 140/80 (12/27/2009)  Prior 10 Yr Risk Heart Disease: Not enough information (03/15/2009)  Labs Reviewed: K+: 4.6 (12/27/2009) Creat: : 1.0 (12/27/2009)   Chol: 232 (08/30/2009)   HDL: 115.30 (08/30/2009)   LDL: DEL (01/07/2008)   TG: 80.0 (08/30/2009)  Complete Medication List: 1)  Aspirin Ec 81 Mg Tbec (Aspirin) .... 2 by mouth once daily 2)  Lisinopril 10 Mg Tabs (Lisinopril) .... One by mouth once daily 3)  Bd U/f Short Pen Needle 31g X 8 Mm Misc (Insulin pen needle) .... For once daily use 4)  Glimepiride 1 Mg Tabs (Glimepiride) .... One by mouth qd 5)  Cialis 5 Mg Tabs (Tadalafil) .... One by mouth once daily 6)  Vytorin 10-40 Mg Tabs (Ezetimibe-simvastatin) .... One by mouth once daily for cholesterol 7)  Januvia 100 Mg Tabs (Sitagliptin phosphate) .... One by mouth once daily for diabetes 8)  Actos 30 Mg Tabs (Pioglitazone hcl) .... One by mouth once daily for diabetes 9)  Levemir Flexpen 100 Unit/ml Soln (Insulin detemir) .... 20 unit subcutaneously once daily 10)  Contour Blood Glucose System W/device Kit (Blood glucose monitoring suppl) .... Use two times a day as directed 11)  Bayer Contour Test Strp (Glucose blood) .... Use two times a day as directed  Patient Instructions: 1)  Please schedule a follow-up appointment in 1 month. 2)  Check your blood sugars regularly. If your readings are usually above 200 or below 70 you should contact our office. 3)  It is important that your Diabetic A1c level is checked every 3 months. 4)  See your eye doctor yearly to check for diabetic eye damage. 5)  Check your feet each night for sore areas, calluses or signs of infection. 6)  Check your Blood Pressure regularly. If it is above 130/80: you should make an  appointment. Prescriptions: BAYER CONTOUR TEST  STRP (GLUCOSE BLOOD) Use two times a day as directed  #100 x 0   Entered and Authorized by:   Etta Grandchild MD   Signed by:  Etta Grandchild MD on 01/03/2010   Method used:   Samples Given   RxID:   [id number] CONTOUR BLOOD GLUCOSE SYSTEM W/DEVICE KIT (BLOOD GLUCOSE MONITORING SUPPL) Use two times a day as directed  #2 kits x 0   Entered and Authorized by:   Etta Grandchild MD   Signed by:   Etta Grandchild MD on 01/03/2010   Method used:   Samples Given   RxID:   [id number] LEVEMIR FLEXPEN 100 UNIT/ML SOLN (INSULIN DETEMIR) 20 unit subcutaneously once daily  #2 pens x 0   Entered and Authorized by:   Etta Grandchild MD   Signed by:   Etta Grandchild MD on 01/03/2010   Method used:   Samples Given   RxID:   [id number] LEVEMIR FLEXPEN 100 UNIT/ML SOLN (INSULIN DETEMIR) 20 unit subcutaneously once daily  #2 pens x 0   Entered and Authorized by:   Etta Grandchild MD   Signed by:   Etta Grandchild MD on 01/03/2010   Method used:   Telephoned to ...       [facility] [address] [phone]* (retail)       [address]       [place], [place]  [address]       Ph: [phone] or [phone]       Fax: [phone]   RxID:   [id number] ACTOS 30 MG TABS (PIOGLITAZONE HCL) One by mouth once daily for diabetes  #30 x 11   Entered and Authorized by:   Etta Grandchild MD   Signed by:   Etta Grandchild MD on 01/03/2010   Method used:   Electronically to        [facility] [address] [phone]* (retail)       [address]       [place], [place]  [address]       Ph: [phone] or [phone]       Fax: [phone]   RxID:   [id number] ACTOS 30 MG TABS (PIOGLITAZONE HCL) One by mouth once daily for diabetes  #30 x 11   Entered and Authorized by:   Etta Grandchild MD   Signed by:   Etta Grandchild MD on 01/03/2010   Method used:   Handwritten   RxID:   [id number] JANUVIA  100 MG TABS (SITAGLIPTIN PHOSPHATE) One by mouth once daily for diabetes  #42 x 0   Entered and Authorized by:   Etta Grandchild MD   Signed by:   Etta Grandchild MD on 01/03/2010   Method used:   Samples Given   RxID:   [id number]    Orders Added: 1)  Diabetic Clinic Referral [Diabetic] 2)  Est. Patient Level IV [15400]

## 2010-03-15 NOTE — Progress Notes (Signed)
Summary: SAMPLES  Phone Note Call from Patient Call back at [phone]   Summary of Call: Patient is requesting samples of med given at last office visit. Pt informed samples Initial call taken by: Lamar Sprinkles, CMA,  January 20, 2010 5:05 PM    Prescriptions: JANUVIA 100 MG TABS (SITAGLIPTIN PHOSPHATE) One by mouth once daily for diabetes  #30 x 0   Entered by:   Lamar Sprinkles, CMA   Authorized by:   Etta Grandchild MD   Signed by:   Lamar Sprinkles, CMA on 01/20/2010   Method used:   Samples Given   RxID:   [id number] ACTOS 30 MG TABS (PIOGLITAZONE HCL) One by mouth once daily for diabetes  #45 x 0   Entered by:   Lamar Sprinkles, CMA   Authorized by:   Etta Grandchild MD   Signed by:   Lamar Sprinkles, CMA on 01/20/2010   Method used:   Samples Given   RxID:   [id number]

## 2010-03-15 NOTE — Assessment & Plan Note (Signed)
Summary: 2 MO ROV /NWS  #   Vital Signs:  Patient profile:   65 year old male Height:      72 inches Weight:      189 pounds BMI:     25.73 O2 Sat:      98 % on Room air Temp:     97.8 degrees F oral Pulse rate:   81 / minute Pulse rhythm:   regular Resp:     16 per minute BP sitting:   130 / 70  (left arm) Cuff size:   large  Vitals Entered By: Rock Nephew CMA (May 18, 2009 10:12 AM)  Nutrition Counseling: Patient's BMI is greater than 25 and therefore counseled on weight management options.  O2 Flow:  Room air CC: follow-up visit, Preventive Care, Hypertension Management Is Patient Diabetic? Yes Did you bring your meter with you today? No   CC:  follow-up visit, Preventive Care, and Hypertension Management.  Hypertension History:      He denies headache, chest pain, palpitations, dyspnea with exertion, orthopnea, PND, peripheral edema, visual symptoms, neurologic problems, syncope, and side effects from treatment.  He notes no problems with any antihypertensive medication side effects.        Positive major cardiovascular risk factors include male age 51 years old or older, diabetes, hyperlipidemia, and hypertension.  Negative major cardiovascular risk factors include negative family history for ischemic heart disease and non-tobacco-user status.        Further assessment for target organ damage reveals no history of ASHD, cardiac end-organ damage (CHF/LVH), stroke/TIA, peripheral vascular disease, renal insufficiency, or hypertensive retinopathy.     Clinical Review Panels:  Lipid Management   Cholesterol:  253 (03/15/2009)   LDL (bad choesterol):  DEL (01/07/2008)   HDL (good cholesterol):  98.60 (03/15/2009)  Diabetes Management   HgBA1C:  10.6 (03/15/2009)   Creatinine:  0.9 (03/15/2009)   Last Dilated Eye Exam:  normal (06/05/2008)   Last Foot Exam:  yes (05/18/2009)   Last Flu Vaccine:  Fluvax 3+ (03/15/2009)   Last Pneumovax:  Pneumovax  (12/13/2007)  CBC   WBC:  7.1 (03/15/2009)   RBC:  4.42 (03/15/2009)   Hgb:  13.9 (03/15/2009)   Hct:  42.2 (03/15/2009)   Platelets:  190.0 (03/15/2009)   MCV  95.4 (03/15/2009)   MCHC  33.0 (03/15/2009)   RDW  12.3 (03/15/2009)   PMN:  50.5 (03/15/2009)   Lymphs:  41.6 (03/15/2009)   Monos:  7.0 (03/15/2009)   Eosinophils:  0.7 (03/15/2009)   Basophil:  0.2 (03/15/2009)  Complete Metabolic Panel   Glucose:  242 (03/15/2009)   Sodium:  136 (03/15/2009)   Potassium:  4.2 (03/15/2009)   Chloride:  100 (03/15/2009)   CO2:  30 (03/15/2009)   BUN:  10 (03/15/2009)   Creatinine:  0.9 (03/15/2009)   Albumin:  4.5 (03/15/2009)   Total Protein:  7.7 (03/15/2009)   Calcium:  9.7 (03/15/2009)   Total Bili:  0.7 (03/15/2009)   Alk Phos:  66 (03/15/2009)   SGPT (ALT):  26 (03/15/2009)   SGOT (AST):  24 (03/15/2009)   Current Medications (verified): 1)  Freestyle Lite Test  Strp (Glucose Blood) .... Test Blood Sugar Daily Before Am Meal 2)  Aspirin Ec 81 Mg Tbec (Aspirin) .... 2 By Mouth Once Daily 3)  Simvastatin 40 Mg Tabs (Simvastatin) .... One By Mouth Qpm 4)  Lisinopril 10 Mg Tabs (Lisinopril) .... One By Mouth Once Daily 5)  Bd U/f Short Pen Needle  31g X 8 Mm Misc (Insulin Pen Needle) .... For Once Daily Use 6)  Glucophage Xr 500 Mg Xr24h-Tab (Metformin Hcl) .... 2 By Mouth Bid 7)  Glimepiride 1 Mg Tabs (Glimepiride) .... One By Mouth Qd  Allergies (verified): No Known Drug Allergies  Past History:  Past Medical History: Reviewed history from 04/10/2008 and no changes required. Diabetes mellitus, type II uncontrolled Hypertension Hyperlipidemia  Erectile dysfunction.   Past Surgical History: Reviewed history from 03/15/2009 and no changes required. Denies surgical history  Family History: Reviewed history from 04/10/2008 and no changes required. Thoracic aneurysm - mother (deceased) Throat cancer - father (deceased)   Social History: Reviewed history from  04/10/2008 and no changes required. Married to [name] for 37 yrs. 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   GU:  Complains of erectile dysfunction; denies discharge, dysuria, hematuria, incontinence, nocturia, urinary frequency, and urinary hesitancy. Endo:  Denies cold intolerance, excessive hunger, excessive thirst, excessive urination, heat intolerance, polyuria, and weight change.  Physical Exam  General:  alert, well-developed, well-nourished, well-hydrated, appropriate dress, normal appearance, healthy-appearing, cooperative to examination, and good hygiene.   Head:  normocephalic, atraumatic, no abnormalities observed, and no abnormalities palpated.   Mouth:  Oral mucosa and oropharynx without lesions or exudates.  Teeth in good repair. Neck:  supple, full ROM, no masses, no thyromegaly, no thyroid nodules or tenderness, no JVD, no HJR, normal carotid upstroke, and no carotid bruits.   Lungs:  normal respiratory effort, no intercostal retractions, no accessory muscle use, normal breath sounds, no dullness, no fremitus, no crackles, and no wheezes.   Heart:  Normal rate and regular rhythm. S1 and S2 normal without gallop, murmur, click, rub or other extra sounds. Abdomen:  soft, non-tender, normal bowel sounds, no distention, no masses, no guarding, no rigidity, no rebound tenderness, no abdominal hernia, no inguinal hernia, no hepatomegaly, and no splenomegaly.   Msk:  normal ROM, no joint tenderness, no joint swelling, no joint warmth, no redness over joints, no joint deformities, no joint instability, and no crepitation.   Pulses:  R and L carotid,radial,femoral,dorsalis pedis and posterior tibial pulses are full and equal  bilaterally Extremities:  No clubbing, cyanosis, edema, or deformity noted with normal full range of motion of all joints.   Neurologic:  No cranial nerve deficits noted. Station and gait are normal. Plantar reflexes are down-going bilaterally. DTRs are symmetrical throughout. Sensory, motor and coordinative functions appear intact. Skin:  Intact without suspicious lesions or rashes Cervical Nodes:  no anterior cervical adenopathy and no posterior cervical adenopathy.   Axillary Nodes:  no R axillary adenopathy and no L axillary adenopathy.   Inguinal Nodes:  no R inguinal adenopathy and no L inguinal adenopathy.   Psych:  Cognition and judgment appear intact. Alert and cooperative with normal attention span and concentration. No apparent delusions, illusions, hallucinations  Diabetes Management Exam:    Foot Exam (with socks and/or shoes not present):       Sensory-Pinprick/Light touch:          Left medial foot (L-4): normal          Left dorsal foot (L-5): normal          Left lateral foot (S-1): normal  Right medial foot (L-4): normal          Right dorsal foot (L-5): normal          Right lateral foot (S-1): normal       Sensory-Monofilament:          Left foot: normal          Right foot: normal       Inspection:          Left foot: normal          Right foot: normal       Nails:          Left foot: normal          Right foot: normal   Impression & Recommendations:  Problem # 1:  ERECTILE DYSFUNCTION (ICD-302.72) Assessment Unchanged  His updated medication list for this problem includes:    Cialis 5 Mg Tabs (Tadalafil) ..... One by mouth once daily  Discussed proper use of medications, as well as side effects.   Problem # 2:  HYPERLIPIDEMIA (ICD-272.4) Assessment: Deteriorated  The following medications were removed from the medication list:    Simvastatin 40 Mg Tabs (Simvastatin) ..... One by mouth qpm His updated medication list for this problem  includes:    Crestor 10 Mg Tabs (Rosuvastatin calcium) ..... One by mouth once daily for cholesterol  Labs Reviewed: SGOT: 24 (03/15/2009)   SGPT: 26 (03/15/2009)  Lipid Goals: Chol Goal: 200 (03/15/2009)   HDL Goal: 40 (03/15/2009)   LDL Goal: 100 (03/15/2009)   TG Goal: 150 (03/15/2009)  Prior 10 Yr Risk Heart Disease: Not enough information (03/15/2009)   HDL:98.60 (03/15/2009), 99.5 (01/07/2008)  LDL:DEL (01/07/2008), DEL (12/05/2007)  Chol:253 (03/15/2009), 207 (01/07/2008)  Trig:66.0 (03/15/2009), 59 (01/07/2008)  Problem # 3:  DIABETES MELLITUS, TYPE II, UNCONTROLLED (ICD-250.02) Assessment: Deteriorated  The following medications were removed from the medication list:    Glucophage Xr 500 Mg Xr24h-tab (Metformin hcl) .[name] Kitchen... 2 by mouth bid His updated medication list for this problem includes:    Aspirin Ec 81 Mg Tbec (Aspirin) .[name] Kitchen... 2 by mouth once daily    Lisinopril 10 Mg Tabs (Lisinopril) ..... One by mouth once daily    Glimepiride 1 Mg Tabs (Glimepiride) ..... One by mouth qd    Janumet 50-1000 Mg Tabs (Sitagliptin-metformin hcl) ..... One by mouth two times a day for diabetes  Labs Reviewed: Creat: 0.9 (03/15/2009)     Last Eye Exam: normal (06/05/2008) Reviewed HgBA1c results: 10.6 (03/15/2009)  10.8 (11/29/2007)  Orders: Ophthalmology Referral (Ophthalmology)  Problem # 4:  HYPERTENSION (ICD-401.9) Assessment: Improved  His updated medication list for this problem includes:    Lisinopril 10 Mg Tabs (Lisinopril) ..... One by mouth once daily  BP today: 130/70 Prior BP: 152/90 (03/15/2009)  Prior 10 Yr Risk Heart Disease: Not enough information (03/15/2009)  Labs Reviewed: K+: 4.2 (03/15/2009) Creat: : 0.9 (03/15/2009)   Chol: 253 (03/15/2009)   HDL: 98.60 (03/15/2009)   LDL: DEL (01/07/2008)   TG: 66.0 (03/15/2009)  Complete Medication List: 1)  Freestyle Lite Test Strp (Glucose blood) .... Test blood sugar daily before am meal 2)  Aspirin Ec 81 Mg Tbec  (Aspirin) .... 2 by mouth once daily 3)  Lisinopril 10 Mg Tabs (Lisinopril) .... One by mouth once daily 4)  Bd U/f Short Pen Needle 31g X 8 Mm Misc (Insulin pen needle) .... For once daily use 5)  Glimepiride 1 Mg Tabs (Glimepiride) .... One by mouth qd 6)  Crestor 10 Mg Tabs (Rosuvastatin calcium) .... One by mouth once daily for cholesterol 7)  Janumet 50-1000 Mg Tabs (Sitagliptin-metformin hcl) .... One by mouth two times a day for diabetes 8)  Cialis 5 Mg Tabs (Tadalafil) .... One by mouth once daily  Hypertension Assessment/Plan:      The patient's hypertensive risk group is category C: Target organ damage and/or diabetes.  Today's blood pressure is 130/70.  His blood pressure goal is < 130/80.  Colorectal Screening:  Current Recommendations:    Hemoccult: NEG X 1 today  PSA Screening:    PSA: 0.64  (03/15/2009)  Immunization & Chemoprophylaxis:    Influenza vaccine: Fluvax 3+  (03/15/2009)    Pneumovax: Pneumovax  (12/13/2007)  Patient Instructions: 1)  Please schedule a follow-up appointment in 3 months. 2)  It is important that you exercise regularly at least 20 minutes 5 times a week. If you develop chest pain, have severe difficulty breathing, or feel very tired , stop exercising immediately and seek medical attention. 3)  You need to lose weight. Consider a lower calorie diet and regular exercise.  4)  Check your blood sugars regularly. If your readings are usually above 200 or below 70 you should contact our office. 5)  It is important that your Diabetic A1c level is checked every 3 months. 6)  See your eye doctor yearly to check for diabetic eye damage. 7)  Check your feet each night for sore areas, calluses or signs of infection. 8)  Check your Blood Pressure regularly. If it is above 130/80: you should make an appointment. Prescriptions: CIALIS 5 MG TABS (TADALAFIL) One by mouth once daily  #24 x 0   Entered and Authorized by:   Etta Grandchild MD   Signed by:    Etta Grandchild MD on 05/18/2009   Method used:   Samples Given   RxID:   [id number] GLIMEPIRIDE 1 MG TABS (GLIMEPIRIDE) one by mouth qd  #30 x 11   Entered and Authorized by:   Etta Grandchild MD   Signed by:   Etta Grandchild MD on 05/18/2009   Method used:   Print then Give to Patient   RxID:   [id number] BD U/F SHORT PEN NEEDLE 31G X 8 MM MISC (INSULIN PEN NEEDLE) for once daily use  #100 x 11   Entered and Authorized by:   Etta Grandchild MD   Signed by:   Etta Grandchild MD on 05/18/2009   Method used:   Print then Give to Patient   RxID:   [id number] LISINOPRIL 10 MG TABS (LISINOPRIL) one by mouth once daily  #30 x 11   Entered and Authorized by:   Etta Grandchild MD   Signed by:   Etta Grandchild MD on 05/18/2009   Method used:   Print then Give to Patient   RxID:   [id number] ASPIRIN EC 81 MG TBEC (ASPIRIN) 2 by mouth once daily  #60 x 11   Entered and Authorized by:   Etta Grandchild MD   Signed by:   Etta Grandchild MD on 05/18/2009   Method used:   Print then Give to Patient   RxID:   [id number] FREESTYLE LITE TEST  STRP (GLUCOSE BLOOD) test blood sugar daily before AM meal  #100 x 3   Entered and Authorized by:   Etta Grandchild MD   Signed by:   Etta Grandchild MD on 05/18/2009   Method used:  Print then Give to Patient   RxID:   [id number] JANUMET 50-1000 MG TABS (SITAGLIPTIN-METFORMIN HCL) One by mouth two times a day for diabetes  #224 x 0   Entered and Authorized by:   Etta Grandchild MD   Signed by:   Etta Grandchild MD on 05/18/2009   Method used:   Samples Given   RxID:   [id number] CRESTOR 10 MG TABS (ROSUVASTATIN CALCIUM) One by mouth once daily for cholesterol  #105 x 0   Entered and Authorized by:   Etta Grandchild MD   Signed by:   Etta Grandchild MD on 05/18/2009   Method used:   Samples Given   RxID:   [id number]

## 2010-03-15 NOTE — Assessment & Plan Note (Signed)
Summary: NEW/ TRANSFER FROM DR Artist Pais Natale Milch  #   Vital Signs:  Patient profile:   65 year old male Height:      72 inches Weight:      192 pounds BMI:     26.13 O2 Sat:      98 % on Room air Temp:     98.0 degrees F oral Pulse rate:   82 / minute Pulse rhythm:   regular Resp:     16 per minute BP sitting:   152 / 90  (left arm) Cuff size:   large  Vitals Entered By: Rock Nephew CMA (March 15, 2009 1:19 PM)  Nutrition Counseling: Patient's BMI is greater than 25 and therefore counseled on weight management options.  O2 Flow:  Room air  Primary Care Provider:  Etta Grandchild MD   History of Present Illness: New to me he has complaints 1. dropped a table on his left foot one month ago and still has soreness in his left great toe and wants to get an Xray done 2. needs a complete f/up, does not check BS or BP at home 3. he has an uncomfortable curvature in his penis during reections  Hypertension History:      He denies headache, chest pain, palpitations, dyspnea with exertion, orthopnea, PND, peripheral edema, visual symptoms, and neurologic problems.  He notes no problems with any antihypertensive medication side effects.        Positive major cardiovascular risk factors include male age 11 years old or older, diabetes, hyperlipidemia, and hypertension.  Negative major cardiovascular risk factors include negative family history for ischemic heart disease and non-tobacco-user status.        Further assessment for target organ damage reveals no history of ASHD, cardiac end-organ damage (CHF/LVH), stroke/TIA, peripheral vascular disease, renal insufficiency, or hypertensive retinopathy.    Lipid Management History:      Positive NCEP/ATP III risk factors include male age 60 years old or older, diabetes, and hypertension.  Negative NCEP/ATP III risk factors include HDL cholesterol greater than 60, no family history for ischemic heart disease, non-tobacco-user status, no ASHD  (atherosclerotic heart disease), no prior stroke/TIA, no peripheral vascular disease, and no history of aortic aneurysm.        The patient states that he knows about the "Therapeutic Lifestyle Change" diet.  His compliance with the TLC diet is not at all.  The patient expresses understanding of adjunctive measures for cholesterol lowering.  Adjunctive measures started by the patient include fiber, ASA, limit alcohol consumpton, and weight reduction.  He expresses no side effects from his lipid-lowering medication.  The patient denies any symptoms to suggest myopathy or liver disease.      Preventive Screening-Counseling & Management  Alcohol-Tobacco     Alcohol drinks/day: 0     Smoking Status: never  Hep-HIV-STD-Contraception     Hepatitis Risk: no risk noted     HIV Risk: no risk noted     STD Risk: no risk noted  Clinical Review Panels:  Prevention   Last Colonoscopy:  normal (11/28/2004)  Immunizations   Last Flu Vaccine:  Fluvax 3+ (03/15/2009)   Last Pneumovax:  Pneumovax (12/13/2007)  Lipid Management   Cholesterol:  207 (01/07/2008)   LDL (bad choesterol):  DEL (01/07/2008)   HDL (good cholesterol):  99.5 (01/07/2008)  Diabetes Management   HgBA1C:  10.8 (11/29/2007)   Creatinine:  0.9 (01/07/2008)   Last Dilated Eye Exam:  normal (06/05/2008)   Last Foot Exam:  yes (03/15/2009)   Last Flu Vaccine:  Fluvax 3+ (03/15/2009)   Last Pneumovax:  Pneumovax (12/13/2007)  CBC   WBC:  7.4 (11/29/2007)   RBC:  4.69 (11/29/2007)   Hgb:  14.9 (11/29/2007)   Hct:  43.4 (11/29/2007)   Platelets:  181 (11/29/2007)   MCV  92.7 (11/29/2007)   MCHC  34.4 (11/29/2007)   RDW  12.2 (11/29/2007)   PMN:  58.8 (11/29/2007)   Lymphs:  33.3 (11/29/2007)   Monos:  7.4 (11/29/2007)   Eosinophils:  0.4 (11/29/2007)   Basophil:  0.1 (11/29/2007)  Complete Metabolic Panel   Glucose:  238 (01/07/2008)   Sodium:  135 (01/07/2008)   Potassium:  4.0 (01/07/2008)   Chloride:  100  (01/07/2008)   CO2:  28 (01/07/2008)   BUN:  15 (01/07/2008)   Creatinine:  0.9 (01/07/2008)   Albumin:  4.3 (11/29/2007)   Total Protein:  7.9 (11/29/2007)   Calcium:  9.2 (01/07/2008)   Total Bili:  0.8 (11/29/2007)   Alk Phos:  77 (11/29/2007)   SGPT (ALT):  28 (01/07/2008)   SGOT (AST):  29 (01/07/2008)   Current Medications (verified): 1)  Freestyle Lite Test  Strp (Glucose Blood) .... Test Blood Sugar Daily Before Am Meal 2)  Aspirin Ec 81 Mg Tbec (Aspirin) .... 2 By Mouth Once Daily 3)  Simvastatin 40 Mg Tabs (Simvastatin) .... One By Mouth Qpm 4)  Lisinopril 10 Mg Tabs (Lisinopril) .... One By Mouth Once Daily 5)  Bd U/f Short Pen Needle 31g X 8 Mm Misc (Insulin Pen Needle) .... For Once Daily Use 6)  Glucophage Xr 500 Mg Xr24h-Tab (Metformin Hcl) .... 2 By Mouth Bid 7)  Glimepiride 1 Mg Tabs (Glimepiride) .... One By Mouth Qd  Allergies (verified): No Known Drug Allergies  Past History:  Past Medical History: Reviewed history from 04/10/2008 and no changes required. Diabetes mellitus, type II uncontrolled Hypertension Hyperlipidemia  Erectile dysfunction.   Past Surgical History: Denies surgical history  Family History: Reviewed history from 04/10/2008 and no changes required. Thoracic aneurysm - mother (deceased) Throat cancer - father (deceased)   Social History: Reviewed history from 04/10/2008 and no changes required. Married to [name] for 37 yrs. 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   GU:  Denies decreased libido, discharge, dysuria, erectile dysfunction, genital sores, hematuria, incontinence, nocturia, urinary frequency, and urinary hesitancy. Endo:  Denies cold intolerance, excessive hunger, excessive thirst, excessive urination, heat intolerance, polyuria, and weight change.  Physical Exam  General:  alert, well-developed, well-nourished, well-hydrated, appropriate dress, normal appearance, healthy-appearing, cooperative to examination, and good hygiene.   Head:  normocephalic, atraumatic, no abnormalities observed, and no abnormalities palpated.   Eyes:  vision grossly intact, pupils equal, pupils round, and pupils reactive to light.   Ears:  R ear normal and L ear normal.   Mouth:  Oral mucosa and oropharynx without lesions or exudates.  Teeth in good repair. Neck:  supple, full ROM, no masses, no thyromegaly, no thyroid nodules or tenderness, no JVD, no HJR, normal carotid upstroke, and no carotid bruits.   Breasts:  No masses or gynecomastia noted Lungs:  normal respiratory effort, no intercostal retractions, no accessory muscle use, normal breath sounds, no dullness, no fremitus, no crackles, and no wheezes.   Heart:  Normal rate and regular rhythm. S1 and S2 normal without gallop, murmur, click, rub or other extra sounds. Abdomen:  soft, non-tender, normal bowel sounds, no distention, no masses, no guarding, no rigidity, no rebound tenderness, no abdominal hernia, no inguinal hernia, no hepatomegaly, and no splenomegaly.   Rectal:  No external abnormalities noted. Normal sphincter tone. No rectal masses or tenderness. heme negative stool. Genitalia:  uncircumcised, no hydrocele, no varicocele, no scrotal masses, no testicular masses or atrophy, no cutaneous lesions, and no urethral discharge.   Prostate:  no nodules, no asymmetry, no induration, and 1+ enlarged.   Msk:  normal ROM, no joint tenderness, no joint swelling, no joint warmth, no redness over  joints, no joint deformities, no joint instability, and no crepitation.   Pulses:  R and L carotid,radial,femoral,dorsalis pedis and posterior tibial pulses are full and equal bilaterally Extremities:  No clubbing, cyanosis, edema, or deformity noted with normal full range of motion of all joints.   Neurologic:  No cranial nerve deficits noted. Station and gait are normal. Plantar reflexes are down-going bilaterally. DTRs are symmetrical throughout. Sensory, motor and coordinative functions appear intact. Skin:  Intact without suspicious lesions or rashes Cervical Nodes:  No lymphadenopathy noted Axillary Nodes:  No palpable lymphadenopathy Inguinal Nodes:  No significant adenopathy Psych:  Cognition and judgment appear intact. Alert and cooperative with normal attention span and concentration. No apparent delusions, illusions, hallucinations Additional Exam:  EKG shows NSR with very poor RWP and possible q waves in V1 and V2. There are no acute ST/T wave changes.  Diabetes Management Exam:    Foot Exam (with socks and/or shoes not present):       Sensory-Pinprick/Light touch:          Left medial foot (L-4): normal          Left dorsal foot (L-5): normal          Left lateral foot (S-1): normal          Right medial foot (L-4): normal          Right dorsal foot (L-5): normal          Right lateral foot (S-1): normal       Sensory-Monofilament:          Left foot: normal          Right foot: normal       Inspection:          Left foot: normal          Right foot: normal       Nails:          Left foot: normal          Right foot: normal   Impression & Recommendations:  Problem # 1:  ELECTROCARDIOGRAM, ABNORMAL (ICD-794.31) Assessment New  Orders: EKG w/ Interpretation (93000) Cardiolite (Cardiolite)  Problem # 2:  HYPERTROPHY PROSTATE W/UR OBST & OTH LUTS (ICD-600.01) Assessment: New  Orders: Venipuncture (08657) TLB-Lipid Panel (80061-LIPID) TLB-BMP (Basic Metabolic  Panel-BMET) (80048-METABOL) TLB-CBC Platelet - w/Differential (85025-CBCD) TLB-Hepatic/Liver Function Pnl (80076-HEPATIC) TLB-TSH (Thyroid Stimulating Hormone) (84443-TSH) TLB-A1C / Hgb A1C (Glycohemoglobin) (83036-A1C) TLB-Microalbumin/Creat Ratio, Urine (82043-MALB) TLB-PSA (Prostate Specific Antigen) (84153-PSA) TLB-Udip w/ Micro (81001-URINE)  Problem # 3:  CRUSHING INJURY, TOE (ICD-928.3) Assessment: New  Orders: T-Foot Left  Min 3 Views [phone])  Problem # 4:  ERECTILE DYSFUNCTION (ICD-302.72) Assessment: Deteriorated  Orders: Venipuncture (45409) TLB-Lipid Panel (80061-LIPID) TLB-BMP (Basic Metabolic Panel-BMET) (80048-METABOL) TLB-CBC Platelet - w/Differential (85025-CBCD) TLB-Hepatic/Liver Function Pnl (80076-HEPATIC) TLB-TSH (Thyroid Stimulating Hormone) (84443-TSH) TLB-A1C / Hgb A1C (Glycohemoglobin) (83036-A1C) TLB-Microalbumin/Creat Ratio, Urine (82043-MALB) TLB-PSA (Prostate Specific Antigen) (84153-PSA) TLB-Udip w/ Micro (81001-URINE) Urology Referral (Urology)  Problem # 5:  DIABETES MELLITUS, TYPE II, UNCONTROLLED (ICD-250.02) Assessment: Unchanged  His updated medication list for this problem includes:    Aspirin Ec 81 Mg Tbec (Aspirin) .[name] Kitchen... 2 by mouth once daily    Lisinopril 10 Mg Tabs (Lisinopril) ..... One by mouth once daily    Glucophage Xr 500 Mg Xr24h-tab (Metformin hcl) .[name] Kitchen... 2 by mouth bid    Glimepiride 1 Mg Tabs (Glimepiride) ..... One by mouth qd  Orders: Venipuncture (81191) TLB-Lipid Panel (80061-LIPID) TLB-BMP (Basic Metabolic Panel-BMET) (80048-METABOL) TLB-CBC Platelet - w/Differential (85025-CBCD) TLB-Hepatic/Liver Function Pnl (80076-HEPATIC) TLB-TSH (Thyroid Stimulating Hormone) (84443-TSH) TLB-A1C / Hgb A1C (Glycohemoglobin) (83036-A1C) TLB-Microalbumin/Creat Ratio, Urine (82043-MALB) TLB-PSA (Prostate Specific Antigen) (84153-PSA) TLB-Udip w/ Micro (81001-URINE)  Labs Reviewed: Creat: 0.9 (01/07/2008)     Last Eye Exam:  normal (06/05/2008) Reviewed HgBA1c results: 10.8 (11/29/2007)  Problem # 6:  HYPERTENSION (ICD-401.9) Assessment: Unchanged  His updated medication list for this problem includes:    Lisinopril 10 Mg Tabs (Lisinopril) ..... One by mouth once daily  Orders: Venipuncture (47829) TLB-Lipid Panel (80061-LIPID) TLB-BMP (Basic Metabolic Panel-BMET) (80048-METABOL) TLB-CBC Platelet - w/Differential (85025-CBCD) TLB-Hepatic/Liver Function Pnl (80076-HEPATIC) TLB-TSH (Thyroid Stimulating Hormone) (84443-TSH) TLB-A1C / Hgb A1C (Glycohemoglobin) (83036-A1C) TLB-Microalbumin/Creat Ratio, Urine (82043-MALB) TLB-PSA (Prostate Specific Antigen) (84153-PSA) TLB-Udip w/ Micro (81001-URINE)  BP today: 152/90 Prior BP: 142/94 (04/10/2008)  10 Yr Risk Heart Disease: Not enough information  Labs Reviewed: K+: 4.0 (01/07/2008) Creat: : 0.9 (01/07/2008)   Chol: 207 (01/07/2008)   HDL: 99.5 (01/07/2008)   LDL: DEL (01/07/2008)   TG: 59 (01/07/2008)  Complete Medication List: 1)  Freestyle Lite Test Strp (Glucose blood) .... Test blood sugar daily before am meal 2)  Aspirin Ec 81 Mg Tbec (Aspirin) .... 2 by mouth once daily 3)  Simvastatin 40 Mg Tabs (Simvastatin) .... One by mouth qpm 4)  Lisinopril 10 Mg Tabs (Lisinopril) .... One by mouth once daily 5)  Bd U/f Short Pen Needle 31g X 8 Mm Misc (Insulin pen needle) .... For once daily use 6)  Glucophage Xr 500 Mg Xr24h-tab (Metformin hcl) .... 2 by mouth bid 7)  Glimepiride 1 Mg Tabs (Glimepiride) .... One by mouth qd  Other Orders: Admin 1st Vaccine (56213) Flu Vaccine 29yrs + (08657)  Hypertension Assessment/Plan:      The patient's hypertensive risk group is category C: Target organ damage and/or diabetes.  Today's blood pressure is 152/90.  His blood pressure goal is < 130/80.  Lipid Assessment/Plan:      Based on NCEP/ATP III, the patient's risk factor category is "history of diabetes".  The patient's lipid goals are as follows: Total  cholesterol goal is 200; LDL cholesterol goal is 100; HDL cholesterol goal is 40; Triglyceride goal is 150.    Patient Instructions: 1)  Please schedule a follow-up appointment in 2 months. 2)  It is important that you exercise regularly at least 20 minutes 5 times a week. If you develop chest pain, have severe difficulty breathing, or feel very tired , stop exercising immediately and seek medical attention. 3)  You need to lose weight. Consider a lower calorie diet and regular exercise.  4)  Check your blood sugars regularly. If your readings are usually above 200  or below 70 you should contact our office. 5)  It is important that your Diabetic A1c level is checked every 3 months. 6)  See your eye doctor yearly to check for diabetic eye damage. 7)  Check your feet each night for sore areas, calluses or signs of infection. 8)  Check your Blood Pressure regularly. If it is above 130/80: you should make an appointment. Flu Vaccine Consent Questions     Do you have a history of severe allergic reactions to this vaccine? no    Any prior history of allergic reactions to egg and/or gelatin? no    Do you have a sensitivity to the preservative Thimersol? no    Do you have a past history of Guillan-Barre Syndrome? no    Do you currently have an acute febrile illness? no    Have you ever had a severe reaction to latex? no    Vaccine information given and explained to patient? yes    Are you currently pregnant? no    Lot Number:[id number]   Exp Date:08/12/2009   Site Given Right Deltoid IM 4)  Check your blood sugars regularly. If your readings are usually above 200  or below 70 you should contact our office. 5)  It is important that your Diabetic A1c level is checked every 3 months. 6)  See your eye doctor yearly to check for diabetic eye damage. 7)  Check your feet each night for sore areas, calluses or signs of infection. 8)  Check your Blood Pressure regularly. If it is above 130/80: you should  make an appointment. [name] Kitchenlbflu

## 2010-03-15 NOTE — Progress Notes (Signed)
----   Converted from flag ---- ---- 03/15/2009 2:36 PM, Edman Circle wrote: appt 2/8 @8 :00  ---- 03/15/2009 2:29 PM, Dagoberto Reef wrote: Thanks  ---- 03/15/2009 1:55 PM, Etta Grandchild MD wrote: The following orders have been entered for this patient and placed on Admin Hold:  Type:     Referral       Code:   Urology Description:   Urology Referral Order Date:   03/15/2009   Authorized By:   Etta Grandchild MD Order #:   [id number] Clinical Notes:   Reason: ------------------------------

## 2010-03-15 NOTE — Letter (Signed)
Summary: Results Follow-up Letter  [facility]  [address] [place], [place] [address]   Phone: [phone]  Fax: [phone]    12/28/2009  [address] [place], [place]  [address]  Dear Mr. Lilley,   The following are the results of your recent test(s):  Test     Result     Blood sugars   very high CBC       mild anemia Muscle enzyme   slightly elevated Liver/kidney   normal  _________________________________________________________  Please call for an appointment soon _________________________________________________________ _________________________________________________________ _________________________________________________________  Sincerely,  Sanda Linger MD [facility]

## 2010-03-15 NOTE — Letter (Signed)
Summary: Results Follow-up Letter  [facility]  [address] [place], [place] [address]   Phone: [phone]  Fax: [phone]    03/16/2009  [address] [place], [place]  [address]  Dear Mr. Friedl,   The following are the results of your recent test(s):  Test     Result     Blood sugars   way too high Liver/kidney   normal Urine       some protein and wbc's Thyroid     normal Prostate     normal CBC       normal   _________________________________________________________  Please call for an appointment in 1-2 months _________________________________________________________ _________________________________________________________ _________________________________________________________  Sincerely,  Sanda Linger MD [facility]

## 2010-03-15 NOTE — Progress Notes (Signed)
Summary: Nuc. Pre-Procedure  Phone Note Outgoing Call Call back at [facility] Phone [phone]   Call placed by: Irean Hong, RN,  March 22, 2009 3:33 PM Summary of Call: Reviewed information on Myoview Information Sheet (see scanned document for further details).  Spoke with patient's daughter.     Nuclear Med Background Indications for Stress Test: Evaluation for Ischemia, Abnormal EKG        Nuclear Pre-Procedure Cardiac Risk Factors: Hypertension, Lipids, NIDDM Height (in): 72

## 2010-03-15 NOTE — Letter (Signed)
Summary: [facility] Consult Scheduled Letter  [facility]  [address] [place], [place] [address]   Phone: [phone]  Fax: [phone]      05/18/2009 MRN: [id number]  [name] [address] [place], [place]  [address]    Dear [name],      We have scheduled an appointment for you.  At the recommendation of Dr.Jones, [facility] center Dr. Ashley Royalty May 12,2011  Thursday at 8:00Am .Their phone number is [phone].If this appointment day and time is not convenient for you, please feel free to call the office of the doctor you are being referred to at the number listed above and reschedule the appointment.  [facility]  [address]   Thank you,  Patient Care Coordinator [facility]

## 2010-03-15 NOTE — Assessment & Plan Note (Signed)
Summary: Cardiology Nuclear Study  Nuclear Med Background Indications for Stress Test: Evaluation for Ischemia, Abnormal EKG    History Comments: NO DOCUMENTED CAD  Symptoms: Palpitations    Nuclear Pre-Procedure Cardiac Risk Factors: Hypertension, Lipids, NIDDM Caffeine/Decaff Intake: None NPO After: 12:00 AM Lungs: Clear IV 0.9% NS with Angio Cath: 20g     IV Site: (R) AC IV Started by: Irean Hong RN Chest Size (in) 42     Height (in): 72 Weight (lb): 188 BMI: 25.59 Tech Comments: No medications taken today.  Nuclear Med Study 1 or 2 day study:  1 day     Stress Test Type:  Stress Reading MD:  Willa Rough, MD     Referring MD:  Sanda Linger, MD Resting Radionuclide:  Technetium 52m Tetrofosmin     Resting Radionuclide Dose:  11.0 mCi  Stress Radionuclide:  Technetium 36m Tetrofosmin     Stress Radionuclide Dose:  33.0 mCi   Stress Protocol Exercise Time (min):  9:30 min     Max HR:  164 bpm     Predicted Max HR:  159 bpm  Max Systolic BP: 189 mm Hg     Percent Max HR:  103.14 %     METS: 10.9 Rate Pressure Product:  04540    Stress Test Technologist:  Rea [employer] CMA-N     Nuclear Technologist:  Burna Mortimer Deal RT-N  Rest Procedure  Myocardial perfusion imaging was performed at rest 45 minutes following the intravenous administration of Myoview Technetium 62m Tetrofosmin.  Stress Procedure  The patient exercised for 9:30.  The patient stopped due to fatigue and denied any chest pain.  There were nonspecific ST-T wave changes and a hypertensive BP response, 189/109, with exercise.  Myoview was injected at peak exercise and myocardial perfusion imaging was performed after a brief delay.  QPS Raw Data Images:  Normal; no motion artifact; normal heart/lung ratio. Stress Images:  There is normal uptake in all areas. Rest Images:  Normal homogeneous uptake in all areas of the myocardium. Subtraction (SDS):  No evidence of ischemia. Transient Ischemic Dilatation:  1.06   (Normal <1.22)  Lung/Heart Ratio:  .29  (Normal <0.45)  Quantitative Gated Spect Images QGS EDV:  118 ml QGS ESV:  58 ml QGS EF:  51 % QGS cine images:  Low normal wall motion  Findings Normal nuclear study      Overall Impression  Exercise Capacity: Good exercise capacity. BP Response: Normal blood pressure response. Clinical Symptoms: No chest pain ECG Impression: No significant ST segment change suggestive of ischemia. Overall Impression: No scar or ischemia. Low normal wall motion.

## 2010-03-15 NOTE — Assessment & Plan Note (Signed)
Summary: follow up-lb   Vital Signs:  Patient profile:   65 year old male Height:      72 inches Weight:      190 pounds BMI:     25.86 O2 Sat:      99 % on Room air Temp:     98.4 degrees F oral Pulse rate:   84 / minute Pulse rhythm:   regular Resp:     16 per minute BP sitting:   140 / 88  (left arm) Cuff size:   large  Vitals Entered By: Rock Nephew CMA (August 30, 2009 2:07 PM)  Nutrition Counseling: Patient's BMI is greater than 25 and therefore counseled on weight management options.  O2 Flow:  Room air CC: follow up// medication mang Is Patient Diabetic? Yes Did you bring your meter with you today? No Pain Assessment Patient in pain? no        Primary Care Provider:  Etta Grandchild MD  CC:  follow up// medication mang.  History of Present Illness:  Follow-Up Visit      This is a 65 year old man who presents for Follow-up visit.  The patient denies chest pain, palpitations, dizziness, syncope, low blood sugar symptoms, high blood sugar symptoms, edema, SOB, DOE, PND, and orthopnea.  Since the last visit the patient notes no new problems or concerns.  The patient reports taking meds as prescribed, monitoring BP, monitoring blood sugars, and dietary compliance.  When questioned about possible medication side effects, the patient notes none.    Preventive Screening-Counseling & Management  Alcohol-Tobacco     Alcohol drinks/day: 0     Smoking Status: never  Hep-HIV-STD-Contraception     Hepatitis Risk: no risk noted     HIV Risk: no risk noted     STD Risk: no risk noted  Medications Prior to Update: 1)  Freestyle Lite Test  Strp (Glucose Blood) .... Test Blood Sugar Daily Before Am Meal 2)  Aspirin Ec 81 Mg Tbec (Aspirin) .... 2 By Mouth Once Daily 3)  Lisinopril 10 Mg Tabs (Lisinopril) .... One By Mouth Once Daily 4)  Bd U/f Short Pen Needle 31g X 8 Mm Misc (Insulin Pen Needle) .... For Once Daily Use 5)  Glimepiride 1 Mg Tabs (Glimepiride) .... One By  Mouth Qd 6)  Crestor 10 Mg Tabs (Rosuvastatin Calcium) .... One By Mouth Once Daily For Cholesterol 7)  Janumet 50-1000 Mg Tabs (Sitagliptin-Metformin Hcl) .... One By Mouth Two Times A Day For Diabetes 8)  Cialis 5 Mg Tabs (Tadalafil) .... One By Mouth Once Daily  Current Medications (verified): 1)  Freestyle Lite Test  Strp (Glucose Blood) .... Test Blood Sugar Daily Before Am Meal 2)  Aspirin Ec 81 Mg Tbec (Aspirin) .... 2 By Mouth Once Daily 3)  Lisinopril 10 Mg Tabs (Lisinopril) .... One By Mouth Once Daily 4)  Bd U/f Short Pen Needle 31g X 8 Mm Misc (Insulin Pen Needle) .... For Once Daily Use 5)  Glimepiride 1 Mg Tabs (Glimepiride) .... One By Mouth Qd 6)  Crestor 10 Mg Tabs (Rosuvastatin Calcium) .... One By Mouth Once Daily For Cholesterol 7)  Janumet 50-1000 Mg Tabs (Sitagliptin-Metformin Hcl) .... One By Mouth Two Times A Day For Diabetes 8)  Cialis 5 Mg Tabs (Tadalafil) .... One By Mouth Once Daily  Allergies (verified): No Known Drug Allergies  Past History:  Past Medical History: Last updated: 04/10/2008 Diabetes mellitus, type II uncontrolled Hypertension Hyperlipidemia  Erectile dysfunction.  Past Surgical History: Last updated: 03/15/2009 Denies surgical history  Family History: Last updated: 04/10/2008 Thoracic aneurysm - mother (deceased) Throat cancer - father (deceased)   Social History: Last updated: 04/10/2008 Married to [name] for 37 yrs. 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 Thoracic aneurysm - mother (deceased) Throat cancer - father (deceased)   Social History: Reviewed history from 04/10/2008 and no changes required. Married to [name] for 37 yrs. 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    Physical Exam  General:  alert, well-developed, well-nourished, well-hydrated, appropriate dress, normal appearance, healthy-appearing, cooperative to examination, and good hygiene.   Mouth:  Oral mucosa and oropharynx without lesions or exudates.  Teeth in good repair. Neck:  supple, full ROM, no masses, no thyromegaly, no thyroid nodules or tenderness, no JVD, no HJR, normal carotid upstroke, and no carotid bruits.   Lungs:  normal respiratory effort, no intercostal retractions, no accessory muscle use, normal breath sounds, no dullness, no fremitus, no crackles, and no wheezes.   Heart:  Normal rate and regular rhythm. S1 and S2 normal without gallop, murmur, click, rub or other extra sounds. Abdomen:  soft, non-tender, normal bowel sounds, no distention, no masses, no guarding, no rigidity, no rebound tenderness, no abdominal hernia, no inguinal hernia, no hepatomegaly, and no splenomegaly.   Msk:  normal ROM, no joint tenderness, no joint swelling, no joint warmth, no redness over joints, no joint deformities, no joint instability, and no crepitation.   Pulses:  R and L carotid,radial,femoral,dorsalis pedis and posterior tibial pulses are full and equal bilaterally Extremities:  No clubbing, cyanosis, edema, or deformity noted with normal full range of motion of all joints.   Neurologic:  No cranial nerve deficits noted. Station and gait are normal. Plantar reflexes are down-going bilaterally. DTRs are symmetrical throughout. Sensory, motor and coordinative functions appear intact. Skin:  Intact without suspicious lesions or rashes Cervical Nodes:  no anterior cervical adenopathy and no posterior cervical adenopathy.   Axillary Nodes:  no R axillary adenopathy and no L axillary adenopathy.   Psych:  Cognition and judgment appear intact. Alert  and cooperative with normal attention span and concentration. No apparent delusions, illusions, hallucinations  Diabetes Management Exam:    Foot Exam (with socks and/or shoes not present):       Sensory-Pinprick/Light touch:          Left medial foot (L-4): normal          Left dorsal foot (L-5): normal          Left lateral foot (S-1): normal          Right medial foot (L-4): normal          Right dorsal foot (L-5): normal          Right lateral foot (S-1): normal       Sensory-Monofilament:          Left foot: normal          Right foot: normal       Inspection:          Left foot: normal  Right foot: normal       Nails:          Left foot: normal          Right foot: normal   Impression & Recommendations:  Problem # 1:  DIABETES MELLITUS, TYPE II, UNCONTROLLED (ICD-250.02) Assessment Unchanged  His updated medication list for this problem includes:    Aspirin Ec 81 Mg Tbec (Aspirin) .[name] Kitchen... 2 by mouth once daily    Lisinopril 10 Mg Tabs (Lisinopril) ..... One by mouth once daily    Glimepiride 1 Mg Tabs (Glimepiride) ..... One by mouth qd    Janumet 50-1000 Mg Tabs (Sitagliptin-metformin hcl) ..... One by mouth two times a day for diabetes  Orders: Venipuncture ([address]) TLB-Lipid Panel (80061-LIPID) TLB-Hepatic/Liver Function Pnl (80076-HEPATIC) TLB-BMP (Basic Metabolic Panel-BMET) (80048-METABOL) TLB-A1C / Hgb A1C (Glycohemoglobin) (83036-A1C)  Labs Reviewed: Creat: 0.9 (03/15/2009)     Last Eye Exam: mild diabetic retinopathy (07/13/2009) Reviewed HgBA1c results: 10.6 (03/15/2009)  10.8 (11/29/2007)  Problem # 2:  HYPERTENSION (ICD-401.9) Assessment: Improved  His updated medication list for this problem includes:    Lisinopril 10 Mg Tabs (Lisinopril) ..... One by mouth once daily  Orders: Venipuncture ([address]) TLB-Lipid Panel (80061-LIPID) TLB-Hepatic/Liver Function Pnl (80076-HEPATIC) TLB-BMP (Basic Metabolic Panel-BMET) (80048-METABOL) TLB-A1C /  Hgb A1C (Glycohemoglobin) (83036-A1C)  BP today: 140/88 Prior BP: 130/70 (05/18/2009)  Prior 10 Yr Risk Heart Disease: Not enough information (03/15/2009)  Labs Reviewed: K+: 4.2 (03/15/2009) Creat: : 0.9 (03/15/2009)   Chol: 253 (03/15/2009)   HDL: 98.60 (03/15/2009)   LDL: DEL (01/07/2008)   TG: 66.0 (03/15/2009)  Complete Medication List: 1)  Freestyle Lite Test Strp (Glucose blood) .... Test blood sugar daily before am meal 2)  Aspirin Ec 81 Mg Tbec (Aspirin) .... 2 by mouth once daily 3)  Lisinopril 10 Mg Tabs (Lisinopril) .... One by mouth once daily 4)  Bd U/f Short Pen Needle 31g X 8 Mm Misc (Insulin pen needle) .... For once daily use 5)  Glimepiride 1 Mg Tabs (Glimepiride) .... One by mouth qd 6)  Crestor 10 Mg Tabs (Rosuvastatin calcium) .... One by mouth once daily for cholesterol 7)  Janumet 50-1000 Mg Tabs (Sitagliptin-metformin hcl) .... One by mouth two times a day for diabetes 8)  Cialis 5 Mg Tabs (Tadalafil) .... One by mouth once daily  Patient Instructions: 1)  Please schedule a follow-up appointment in 4 months. 2)  It is important that you exercise regularly at least 20 minutes 5 times a week. If you develop chest pain, have severe difficulty breathing, or feel very tired , stop exercising immediately and seek medical attention. 3)  You need to lose weight. Consider a lower calorie diet and regular exercise.  4)  Check your blood sugars regularly. If your readings are usually above 200 or below 70 you should contact our office. 5)  It is important that your Diabetic A1c level is checked every 3 months. 6)  See your eye doctor yearly to check for diabetic eye damage. 7)  Check your feet each night for sore areas, calluses or signs of infection. 8)  Check your Blood Pressure regularly. If it is above 130/80: you should make an appointment. Prescriptions: CRESTOR 10 MG TABS (ROSUVASTATIN CALCIUM) One by mouth once daily for cholesterol  #30 x 11   Entered and  Authorized by:   Etta Grandchild MD   Signed by:   Etta Grandchild MD on 08/30/2009   Method used:   Electronically to  [facility] [address] [phone]* (retail)       [address]       [place], [place]  [address]       Ph: [phone] or [phone]       Fax: [phone]   RxID:   [id number] JANUMET 50-1000 MG TABS (SITAGLIPTIN-METFORMIN HCL) One by mouth two times a day for diabetes  #60 x 11   Entered and Authorized by:   Etta Grandchild MD   Signed by:   Etta Grandchild MD on 08/30/2009   Method used:   [facility]ically to        [facility] [address] [phone]* (retail)       [address]       [place], [place]  [address]       Ph: [phone] or [phone]       Fax: [phone]   RxID:   [id number]   Preventive Care Screening  Last Tetanus Booster:    Date:  09/13/2008    Results:  Historical

## 2010-03-15 NOTE — Letter (Signed)
Summary: Lipid Letter  [facility]  [address] [place], [place] [address]   Phone: [phone]  Fax: [phone]    03/16/2009  Travonne Schowalter [address] [place], [place]  [address]  Dear [name]:  We have carefully reviewed your last lipid profile from 01/07/2008 and the results are noted below with a summary of recommendations for lipid management.    Cholesterol:       253     Goal: <200   HDL "good" Cholesterol:   57.84     Goal: >40   LDL "bad" Cholesterol:   146     Goal: <100   Triglycerides:       66.0     Goal: <150        TLC Diet (Therapeutic Lifestyle Change): Saturated Fats & Transfatty acids should be kept < 7% of total calories ***Reduce Saturated Fats Polyunstaurated Fat can be up to 10% of total calories Monounsaturated Fat Fat can be up to 20% of total calories Total Fat should be no greater than 25-35% of total calories Carbohydrates should be 50-60% of total calories Protein should be approximately 15% of total calories Fiber should be at least 20-30 grams a day ***Increased fiber may help lower LDL Total Cholesterol should be < 200mg /day Consider adding plant stanol/sterols to diet (example: Benacol spread) ***A higher intake of unsaturated fat may reduce Triglycerides and Increase HDL    Adjunctive Measures (may lower LIPIDS and reduce risk of Heart Attack) include: Aerobic Exercise (20-30 minutes 3-4 times a week) Limit Alcohol Consumption Weight Reduction Aspirin 75-81 mg a day by mouth (if not allergic or contraindicated) Dietary Fiber 20-30 grams a day by mouth     Current Medications: 1)    Freestyle Lite Test  Strp (Glucose blood) .... Test blood sugar daily before am meal 2)    Aspirin Ec 81 Mg Tbec (Aspirin) .... 2 by mouth once daily 3)    Simvastatin 40 Mg Tabs (Simvastatin) .... One by mouth qpm 4)    Lisinopril 10 Mg Tabs (Lisinopril) .... One by mouth once daily 5)    Bd U/f Short Pen Needle 31g X 8 Mm Misc  (Insulin pen needle) .... For once daily use 6)    Glucophage Xr 500 Mg Xr24h-tab (Metformin hcl) .... 2 by mouth bid 7)    Glimepiride 1 Mg Tabs (Glimepiride) .... One by mouth qd  If you have any questions, please call. We appreciate being able to work with you.   Sincerely,    [facility] Etta Grandchild MD

## 2010-03-15 NOTE — Letter (Signed)
Summary: [facility]   Imported By: Sherian Rein 07/27/2009 10:04:19  _____________________________________________________________________  External Attachment:    Type:   Image     Comment:   External Document

## 2010-03-17 NOTE — Assessment & Plan Note (Signed)
Summary: one month follow up-lb   Vital Signs:  Patient profile:   65 year old male Height:      72 inches Weight:      192 pounds BMI:     26.13 O2 Sat:      97 % on Room air Temp:     98.4 degrees F oral Pulse rate:   83 / minute Pulse rhythm:   regular Resp:     16 per minute BP sitting:   130 / 80  (left arm) Cuff size:   large  Vitals Entered By: Rock Nephew CMA (February 02, 2010 3:31 PM)  Nutrition Counseling: Patient's BMI is greater than 25 and therefore counseled on weight management options.  O2 Flow:  Room air  Primary Care Khris Jansson:  Etta Grandchild MD   History of Present Illness:  Follow-Up Visit      This is a 65 year old man who presents for Follow-up visit.  The patient denies chest pain, palpitations, dizziness, syncope, low blood sugar symptoms, high blood sugar symptoms, edema, SOB, DOE, PND, and orthopnea.  Since the last visit the patient notes no new problems or concerns.  The patient reports taking meds as prescribed, monitoring BP, monitoring blood sugars, and dietary compliance.  When questioned about possible medication side effects, the patient notes none.    Preventive Screening-Counseling & Management  Alcohol-Tobacco     Alcohol drinks/day: 0     Alcohol Counseling: not indicated; patient does not drink     Smoking Status: never     Tobacco Counseling: not indicated; no tobacco use  Hep-HIV-STD-Contraception     Hepatitis Risk: no risk noted     HIV Risk: no risk noted     STD Risk: no risk noted      Sexual History:  currently monogamous.        Drug Use:  never.        Blood Transfusions:  no.    Clinical Review Panels:  Prevention   Last Colonoscopy:  normal (11/28/2004)   Last PSA:  0.64 (03/15/2009)  Immunizations   Last Tetanus Booster:  Historical (09/13/2008)   Last Flu Vaccine:  Fluvax 3+ (03/15/2009)   Last Pneumovax:  Pneumovax (12/13/2007)  Lipid Management   Cholesterol:  232 (08/30/2009)   LDL (bad  choesterol):  DEL (01/07/2008)   HDL (good cholesterol):  115.30 (08/30/2009)  Diabetes Management   HgBA1C:  9.5 (12/27/2009)   Creatinine:  1.0 (12/27/2009)   Last Dilated Eye Exam:  mild diabetic retinopathy (07/13/2009)   Last Foot Exam:  yes (02/02/2010)   Last Flu Vaccine:  Fluvax 3+ (03/15/2009)   Last Pneumovax:  Pneumovax (12/13/2007)  CBC   WBC:  8.4 (12/27/2009)   RBC:  4.05 (12/27/2009)   Hgb:  12.6 (12/27/2009)   Hct:  37.2 (12/27/2009)   Platelets:  212.0 (12/27/2009)   MCV  91.8 (12/27/2009)   MCHC  34.0 (12/27/2009)   RDW  13.3 (12/27/2009)   PMN:  60.5 (12/27/2009)   Lymphs:  27.0 (12/27/2009)   Monos:  11.3 (12/27/2009)   Eosinophils:  0.8 (12/27/2009)   Basophil:  0.4 (12/27/2009)  Complete Metabolic Panel   Glucose:  208 (12/27/2009)   Sodium:  136 (12/27/2009)   Potassium:  4.6 (12/27/2009)   Chloride:  99 (12/27/2009)   CO2:  29 (12/27/2009)   BUN:  11 (12/27/2009)   Creatinine:  1.0 (12/27/2009)   Albumin:  4.1 (12/27/2009)   Total Protein:  7.2 (12/27/2009)  Calcium:  9.4 (12/27/2009)   Total Bili:  0.6 (12/27/2009)   Alk Phos:  66 (12/27/2009)   SGPT (ALT):  27 (12/27/2009)   SGOT (AST):  36 (12/27/2009)   Medications Prior to Update: 1)  Aspirin Ec 81 Mg Tbec (Aspirin) .... 2 By Mouth Once Daily 2)  Lisinopril 10 Mg Tabs (Lisinopril) .... One By Mouth Once Daily 3)  Bd U/f Short Pen Needle 31g X 8 Mm Misc (Insulin Pen Needle) .... For Once Daily Use 4)  Glimepiride 1 Mg Tabs (Glimepiride) .... One By Mouth Qd 5)  Cialis 5 Mg Tabs (Tadalafil) .... One By Mouth Once Daily 6)  Vytorin 10-40 Mg Tabs (Ezetimibe-Simvastatin) .... One By Mouth Once Daily For Cholesterol 7)  Januvia 100 Mg Tabs (Sitagliptin Phosphate) .... One By Mouth Once Daily For Diabetes 8)  Actos 30 Mg Tabs (Pioglitazone Hcl) .... One By Mouth Once Daily For Diabetes 9)  Levemir Flexpen 100 Unit/ml Soln (Insulin Detemir) .... 20 Unit Subcutaneously Once Daily 10)  Contour  Blood Glucose System W/device Kit (Blood Glucose Monitoring Suppl) .... Use Two Times A Day As Directed 11)  Bayer Contour Test  Strp (Glucose Blood) .... Use Two Times A Day As Directed  Current Medications (verified): 1)  Aspirin Ec 81 Mg Tbec (Aspirin) .... 2 By Mouth Once Daily 2)  Lisinopril 10 Mg Tabs (Lisinopril) .... One By Mouth Once Daily 3)  Bd U/f Short Pen Needle 31g X 8 Mm Misc (Insulin Pen Needle) .... For Once Daily Use 4)  Glimepiride 1 Mg Tabs (Glimepiride) .... One By Mouth Qd 5)  Cialis 5 Mg Tabs (Tadalafil) .... One By Mouth Once Daily 6)  Vytorin 10-40 Mg Tabs (Ezetimibe-Simvastatin) .... One By Mouth Once Daily For Cholesterol 7)  Januvia 100 Mg Tabs (Sitagliptin Phosphate) .... One By Mouth Once Daily For Diabetes 8)  Actos 30 Mg Tabs (Pioglitazone Hcl) .... One By Mouth Once Daily For Diabetes 9)  Levemir Flexpen 100 Unit/ml Soln (Insulin Detemir) .... 20 Unit Subcutaneously Once Daily 10)  Contour Blood Glucose System W/device Kit (Blood Glucose Monitoring Suppl) .... Use Two Times A Day As Directed 11)  Bayer Contour Test  Strp (Glucose Blood) .... Use Two Times A Day As Directed  Allergies (verified): No Known Drug Allergies  Past History:  Past Medical History: Last updated: 04/10/2008 Diabetes mellitus, type II uncontrolled Hypertension Hyperlipidemia  Erectile dysfunction.   Past Surgical History: Last updated: 03/15/2009 Denies surgical history  Family History: Last updated: 04/10/2008 Thoracic aneurysm - mother (deceased) Throat cancer - father (deceased)   Social History: Last updated: 04/10/2008 Married to [name] for 37 yrs. 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 Thoracic aneurysm - mother (deceased) Throat cancer -  father (deceased)   Social History: Reviewed history from 04/10/2008 and no changes required. Married to [name] for 37 yrs. 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    Physical Exam  General:  alert, well-developed, well-nourished, well-hydrated, appropriate  dress, normal appearance, healthy-appearing, and overweight-appearing.   Head:  normocephalic, atraumatic, no abnormalities observed, and no abnormalities palpated.   Mouth:  Oral mucosa and oropharynx without lesions or exudates.  Teeth in good repair. Neck:  supple, full ROM, no masses, no thyromegaly, no JVD, normal carotid upstroke, no carotid bruits, and no cervical lymphadenopathy.   Lungs:  normal respiratory effort, no intercostal retractions, no accessory muscle use, normal breath sounds, no dullness, no fremitus, no crackles, and no wheezes.   Heart:  normal rate, regular rhythm, no murmur, no gallop, and no rub.   Abdomen:  soft, non-tender, normal bowel sounds, no distention, no masses, no guarding, no rigidity, no rebound tenderness, no abdominal hernia, no inguinal hernia, no hepatomegaly, and no splenomegaly.   Msk:  normal ROM, no joint tenderness, no joint swelling, no joint warmth, no redness over joints, no joint deformities, no joint instability, and no crepitation.   Pulses:  R and L carotid,radial,femoral,dorsalis pedis and posterior tibial pulses are full and equal bilaterally Extremities:  No clubbing, cyanosis, edema, or deformity noted with normal full range of motion of all joints.     Neurologic:  No cranial nerve deficits noted. Station and gait are normal. Plantar reflexes are down-going bilaterally. DTRs are symmetrical throughout. Sensory, motor and coordinative functions appear intact. Skin:  Intact without suspicious lesions or rashes Cervical Nodes:  no anterior cervical adenopathy and no posterior cervical adenopathy.   Axillary Nodes:  no R axillary adenopathy and no L axillary adenopathy.   Psych:  Cognition and judgment appear intact. Alert and cooperative with normal attention span and concentration. No apparent delusions, illusions, hallucinations  Diabetes Management Exam:    Foot Exam (with socks and/or shoes not present):       Sensory-Pinprick/Light touch:          Left medial foot (L-4): normal          Left dorsal foot (L-5): normal          Left lateral foot (S-1): normal          Right medial foot (L-4): normal          Right dorsal foot (L-5): normal          Right lateral foot (S-1): normal       Sensory-Monofilament:          Left foot: normal          Right foot: normal       Inspection:          Left foot: normal          Right foot: normal       Nails:          Left foot: normal          Right foot: normal   Impression & Recommendations:  Problem # 1:  DIABETES MELLITUS, TYPE II, UNCONTROLLED (ICD-250.02) Assessment Unchanged  His updated medication list for this problem includes:    Aspirin Ec 81 Mg Tbec (Aspirin) .[name] Kitchen... 2 by mouth once daily    Lisinopril 10 Mg Tabs (Lisinopril) ..... One by mouth once daily    Glimepiride 1 Mg Tabs (Glimepiride) ..... One by mouth qd    Januvia 100 Mg Tabs (Sitagliptin phosphate) ..... One by mouth once daily for diabetes    Actos 30 Mg Tabs (Pioglitazone hcl) ..... One by mouth once daily for diabetes    Levemir Flexpen 100 Unit/ml Soln (Insulin detemir) .[name] Kitchen... 20 unit subcutaneously once  daily  Labs Reviewed: Creat: 1.0 (12/27/2009)     Last Eye Exam: mild diabetic retinopathy  (07/13/2009) Reviewed HgBA1c results: 9.5 (12/27/2009)  9.9 (08/30/2009)  Problem # 2:  HYPERLIPIDEMIA (ICD-272.4) Assessment: Improved  His updated medication list for this problem includes:    Vytorin 10-40 Mg Tabs (Ezetimibe-simvastatin) ..... One by mouth once daily for cholesterol  Labs Reviewed: SGOT: 36 (12/27/2009)   SGPT: 27 (12/27/2009)  Lipid Goals: Chol Goal: 200 (03/15/2009)   HDL Goal: 40 (03/15/2009)   LDL Goal: 100 (03/15/2009)   TG Goal: 150 (03/15/2009)  Prior 10 Yr Risk Heart Disease: Not enough information (03/15/2009)   HDL:115.30 (08/30/2009), 98.60 (03/15/2009)  LDL:DEL (01/07/2008), DEL (12/05/2007)  Chol:232 (08/30/2009), 253 (03/15/2009)  Trig:80.0 (08/30/2009), 66.0 (03/15/2009)  Problem # 3:  HYPERTENSION (ICD-401.9) Assessment: Improved  His updated medication list for this problem includes:    Lisinopril 10 Mg Tabs (Lisinopril) ..... One by mouth once daily  BP today: 130/80 Prior BP: 136/88 (01/03/2010)  Prior 10 Yr Risk Heart Disease: Not enough information (03/15/2009)  Labs Reviewed: K+: 4.6 (12/27/2009) Creat: : 1.0 (12/27/2009)   Chol: 232 (08/30/2009)   HDL: 115.30 (08/30/2009)   LDL: DEL (01/07/2008)   TG: 80.0 (08/30/2009)  Complete Medication List: 1)  Aspirin Ec 81 Mg Tbec (Aspirin) .... 2 by mouth once daily 2)  Lisinopril 10 Mg Tabs (Lisinopril) .... One by mouth once daily 3)  Bd U/f Short Pen Needle 31g X 8 Mm Misc (Insulin pen needle) .... For once daily use 4)  Glimepiride 1 Mg Tabs (Glimepiride) .... One by mouth qd 5)  Cialis 5 Mg Tabs (Tadalafil) .... One by mouth once daily 6)  Vytorin 10-40 Mg Tabs (Ezetimibe-simvastatin) .... One by mouth once daily for cholesterol 7)  Januvia 100 Mg Tabs (Sitagliptin phosphate) .... One by mouth once daily for diabetes 8)  Actos 30 Mg Tabs (Pioglitazone hcl) .... One by mouth once daily for diabetes 9)  Levemir Flexpen 100 Unit/ml Soln (Insulin detemir) .... 20 unit subcutaneously once  daily 10)  Contour Blood Glucose System W/device Kit (Blood glucose monitoring suppl) .... Use two times a day as directed 11)  Bayer Contour Test Strp (Glucose blood) .... Use two times a day as directed  Patient Instructions: 1)  Please schedule a follow-up appointment in 2 months. 2)  It is important that you exercise regularly at least 20 minutes 5 times a week. If you develop chest pain, have severe difficulty breathing, or feel very tired , stop exercising immediately and seek medical attention. 3)  You need to lose weight. Consider a lower calorie diet and regular exercise.  4)  Check your blood sugars regularly. If your readings are usually above 200 or below 70 you should contact our office. 5)  It is important that your Diabetic A1c level is checked every 3 months. 6)  See your eye doctor yearly to check for diabetic eye damage. 7)  Check your feet each night for sore areas, calluses or signs of infection. 8)  Check your Blood Pressure regularly. If it is above 130/80: you should make an appointment. Prescriptions: BD U/F SHORT PEN NEEDLE 31G X 8 MM MISC (INSULIN PEN NEEDLE) for once daily use  #30 x 11   Entered and Authorized by:   Etta Grandchild MD   Signed by:   Etta Grandchild MD on 02/02/2010   Method used:   Electronically to        [facility] [address] [id number]* (retail)       [address]       [place], [place]  [address]       Ph: [phone] or [phone]       Fax: [phone]   RxID:   [id number] BAYER CONTOUR TEST  STRP (GLUCOSE BLOOD) Use two times a day as directed  #60 x 11   Entered and Authorized by:   Etta Grandchild MD   Signed by:   Etta Grandchild MD on 02/02/2010   Method used:   [facility]        [facility] [address] [phone]* (retail)       [address]       [place], [place]  [address]       Ph: [phone] or [phone]       Fax: [phone]   RxID:   [id number] LEVEMIR FLEXPEN  100 UNIT/ML SOLN (INSULIN DETEMIR) 20 unit subcutaneously once daily  #1 month x 11   Entered and Authorized by:   Etta Grandchild MD   Signed by:   Etta Grandchild MD on 02/02/2010   Method used:   [facility] to        [facility] [address] [phone]* (retail)       [address]       [place], [place]  [address]       Ph: [phone] or [phone]       Fax: [phone]   RxID:   [id number] ACTOS 30 MG TABS (PIOGLITAZONE HCL) One by mouth once daily for diabetes  #30 x 11   Entered and Authorized by:   Etta Grandchild MD   Signed by:   Etta Grandchild MD on 02/02/2010   Method used:   [facility]ron[facility] to        [facility] [address] [phone]* (retail)       [address]       [place], [place]  [address]       Ph: [phone] or [phone]       Fax: [phone]   RxID:   [id number] JANUVIA 100 MG TABS (SITAGLIPTIN PHOSPHATE) One by mouth once daily for diabetes  #30 x 11   Entered and Authorized by:   Etta Grandchild MD   Signed by:   Etta Grandchild MD on 02/02/2010   Method used:   [facility]ron[facility] to        [facility] [address] [phone]* (retail)       [address]       [place], [place]  [address]       Ph: [phone] or [phone]       Fax: [phone]   RxID:   [id number] VYTORIN 10-40 MG TABS (EZETIMIBE-SIMVASTATIN) One by mouth once daily for cholesterol  #114 x 0   Entered and Authorized by:   Etta Grandchild MD   Signed by:   Etta Grandchild MD on 02/02/2010   Method used:   Samples Given   RxID:   [id number]    Orders Added: 1)  Est. Patient Level III [20947]

## 2010-03-17 NOTE — Progress Notes (Signed)
Summary: samples  Phone Note Call from Patient Call back at Home Phone [phone] Call back at [phone]   Caller: Patient Reason for Call: Talk to Nurse Summary of Call: pt requesting  Levemir Flexpen samples, we are out, pt request a call when we get some in Initial call taken by: Migdalia Dk,  January 31, 2010 3:38 PM  Follow-up for Phone Call        Pt's spouse advised that we are out of Levermir but pt may call back at his convinence to check availability. SPouse agreed and will advise pt of same. Follow-up by: Margaret Pyle, CMA,  February 03, 2010 8:45 AM

## 2010-04-06 ENCOUNTER — Other Ambulatory Visit: Payer: BC Managed Care – PPO

## 2010-04-06 ENCOUNTER — Encounter: Payer: Self-pay | Admitting: Internal Medicine

## 2010-04-06 ENCOUNTER — Encounter (INDEPENDENT_AMBULATORY_CARE_PROVIDER_SITE_OTHER): Payer: Self-pay | Admitting: *Deleted

## 2010-04-06 ENCOUNTER — Ambulatory Visit (INDEPENDENT_AMBULATORY_CARE_PROVIDER_SITE_OTHER): Payer: BC Managed Care – PPO | Admitting: Internal Medicine

## 2010-04-06 ENCOUNTER — Other Ambulatory Visit: Payer: Self-pay | Admitting: Internal Medicine

## 2010-04-06 DIAGNOSIS — I10 Essential (primary) hypertension: Secondary | ICD-10-CM

## 2010-04-06 DIAGNOSIS — E785 Hyperlipidemia, unspecified: Secondary | ICD-10-CM

## 2010-04-06 DIAGNOSIS — N138 Other obstructive and reflux uropathy: Secondary | ICD-10-CM

## 2010-04-06 DIAGNOSIS — F528 Other sexual dysfunction not due to a substance or known physiological condition: Secondary | ICD-10-CM

## 2010-04-06 DIAGNOSIS — I359 Nonrheumatic aortic valve disorder, unspecified: Secondary | ICD-10-CM | POA: Insufficient documentation

## 2010-04-06 DIAGNOSIS — IMO0001 Reserved for inherently not codable concepts without codable children: Secondary | ICD-10-CM

## 2010-04-06 DIAGNOSIS — R011 Cardiac murmur, unspecified: Secondary | ICD-10-CM

## 2010-04-06 DIAGNOSIS — E1165 Type 2 diabetes mellitus with hyperglycemia: Secondary | ICD-10-CM

## 2010-04-06 DIAGNOSIS — Z23 Encounter for immunization: Secondary | ICD-10-CM

## 2010-04-06 DIAGNOSIS — N401 Enlarged prostate with lower urinary tract symptoms: Secondary | ICD-10-CM

## 2010-04-06 LAB — CBC WITH DIFFERENTIAL/PLATELET
Basophils Relative: 0.3 % (ref 0.0–3.0)
Eosinophils Relative: 1.6 % (ref 0.0–5.0)
Lymphocytes Relative: 24.5 % (ref 12.0–46.0)
Neutrophils Relative %: 65 % (ref 43.0–77.0)
RBC: 4.38 Mil/uL (ref 4.22–5.81)
WBC: 11.3 10*3/uL — ABNORMAL HIGH (ref 4.5–10.5)

## 2010-04-06 LAB — BASIC METABOLIC PANEL
Calcium: 10 mg/dL (ref 8.4–10.5)
Creatinine, Ser: 1 mg/dL (ref 0.4–1.5)
GFR: 100.61 mL/min (ref >60.00)

## 2010-04-06 LAB — URINALYSIS, ROUTINE W REFLEX MICROSCOPIC
Bilirubin Urine: NEGATIVE
Ketones, ur: NEGATIVE
Nitrite: NEGATIVE
Specific Gravity, Urine: 1.025 (ref 1.000–1.030)
Total Protein, Urine: NEGATIVE
pH: 5 (ref 5.0–8.0)

## 2010-04-06 LAB — HEPATIC FUNCTION PANEL
ALT: 57 U/L — ABNORMAL HIGH (ref 0–53)
Alkaline Phosphatase: 65 U/L (ref 39–117)
Bilirubin, Direct: 0.2 mg/dL (ref 0.0–0.3)
Total Protein: 7.5 g/dL (ref 6.0–8.3)

## 2010-04-06 LAB — HEMOGLOBIN A1C: Hgb A1c MFr Bld: 9 % — ABNORMAL HIGH (ref 4.6–6.5)

## 2010-04-06 LAB — LIPID PANEL
Cholesterol: 208 mg/dL — ABNORMAL HIGH (ref 0–200)
HDL: 100 mg/dL (ref >39.00)
Total CHOL/HDL Ratio: 2
Triglycerides: 54 mg/dL (ref 0.0–149.0)
VLDL: 10.8 mg/dL (ref 0.0–40.0)

## 2010-04-07 ENCOUNTER — Encounter: Payer: Self-pay | Admitting: Internal Medicine

## 2010-04-12 NOTE — Assessment & Plan Note (Signed)
Summary: 2 MO FU /NWS   Vital Signs:  Patient profile:   65 year old male Height:      72 inches Weight:      192 pounds BMI:     26.13 O2 Sat:      98 % on Room air Temp:     98.4 degrees F oral Pulse rate:   71 / minute Pulse rhythm:   regular Resp:     16 per minute BP sitting:   122 / 70  (left arm) Cuff size:   large  Vitals Entered By: Bill Salinas CMA (April 06, 2010 3:20 PM)  Nutrition Counseling: Patient's BMI is greater than 25 and therefore counseled on weight management options.  O2 Flow:  Room air CC: 2 month follow up/ ab Is Patient Diabetic? Yes Did you bring your meter with you today? No Pain Assessment Patient in pain? no      CBG Result 121   Primary Care Provider:  Etta Grandchild MD  CC:  2 month follow up/ ab.  History of Present Illness:  Follow-Up Visit      This is a 65 year old man who presents for Follow-up visit.  The patient denies chest pain, palpitations, dizziness, syncope, low blood sugar symptoms, high blood sugar symptoms, edema, SOB, DOE, PND, and orthopnea.  Since the last visit the patient notes no new problems or concerns.  The patient reports taking meds as prescribed, monitoring BP, monitoring blood sugars, and dietary compliance.  When questioned about possible medication side effects, the patient notes sexual dysfunction.    Preventive Screening-Counseling & Management  Alcohol-Tobacco     Alcohol drinks/day: 0     Alcohol Counseling: not indicated; patient does not drink     Smoking Status: never     Tobacco Counseling: not indicated; no tobacco use  Hep-HIV-STD-Contraception     Hepatitis Risk: no risk noted     HIV Risk: no risk noted     STD Risk: no risk noted      Sexual History:  currently monogamous.        Drug Use:  never.        Blood Transfusions:  no.    Clinical Review Panels:  Prevention   Last Colonoscopy:  normal (11/28/2004)   Last PSA:  0.64 (03/15/2009)  Immunizations   Last Tetanus  Booster:  Historical (09/13/2008)   Last Flu Vaccine:  Fluvax 3+ (04/06/2010)   Last Pneumovax:  Pneumovax (12/13/2007)  Lipid Management   Cholesterol:  232 (08/30/2009)   LDL (bad choesterol):  DEL (01/07/2008)   HDL (good cholesterol):  115.30 (08/30/2009)  Diabetes Management   HgBA1C:  9.5 (12/27/2009)   Creatinine:  1.0 (12/27/2009)   Last Dilated Eye Exam:  mild diabetic retinopathy (07/13/2009)   Last Foot Exam:  yes (04/06/2010)   Last Flu Vaccine:  Fluvax 3+ (04/06/2010)   Last Pneumovax:  Pneumovax (12/13/2007)  CBC   WBC:  8.4 (12/27/2009)   RBC:  4.05 (12/27/2009)   Hgb:  12.6 (12/27/2009)   Hct:  37.2 (12/27/2009)   Platelets:  212.0 (12/27/2009)   MCV  91.8 (12/27/2009)   MCHC  34.0 (12/27/2009)   RDW  13.3 (12/27/2009)   PMN:  60.5 (12/27/2009)   Lymphs:  27.0 (12/27/2009)   Monos:  11.3 (12/27/2009)   Eosinophils:  0.8 (12/27/2009)   Basophil:  0.4 (12/27/2009)  Complete Metabolic Panel   Glucose:  208 (12/27/2009)   Sodium:  136 (12/27/2009)  Potassium:  4.6 (12/27/2009)   Chloride:  99 (12/27/2009)   CO2:  29 (12/27/2009)   BUN:  11 (12/27/2009)   Creatinine:  1.0 (12/27/2009)   Albumin:  4.1 (12/27/2009)   Total Protein:  7.2 (12/27/2009)   Calcium:  9.4 (12/27/2009)   Total Bili:  0.6 (12/27/2009)   Alk Phos:  66 (12/27/2009)   SGPT (ALT):  27 (12/27/2009)   SGOT (AST):  36 (12/27/2009)   Medications Prior to Update: 1)  Aspirin Ec 81 Mg Tbec (Aspirin) .... 2 By Mouth Once Daily 2)  Lisinopril 10 Mg Tabs (Lisinopril) .... One By Mouth Once Daily 3)  Bd U/f Short Pen Needle 31g X 8 Mm Misc (Insulin Pen Needle) .... For Once Daily Use 4)  Glimepiride 1 Mg Tabs (Glimepiride) .... One By Mouth Qd 5)  Cialis 5 Mg Tabs (Tadalafil) .... One By Mouth Once Daily 6)  Vytorin 10-40 Mg Tabs (Ezetimibe-Simvastatin) .... One By Mouth Once Daily For Cholesterol 7)  Januvia 100 Mg Tabs (Sitagliptin Phosphate) .... One By Mouth Once Daily For Diabetes 8)   Actos 30 Mg Tabs (Pioglitazone Hcl) .... One By Mouth Once Daily For Diabetes 9)  Levemir Flexpen 100 Unit/ml Soln (Insulin Detemir) .... 20 Unit Subcutaneously Once Daily 10)  Contour Blood Glucose System W/device Kit (Blood Glucose Monitoring Suppl) .... Use Two Times A Day As Directed 11)  Bayer Contour Test  Strp (Glucose Blood) .... Use Two Times A Day As Directed  Current Medications (verified): 1)  Aspirin Ec 81 Mg Tbec (Aspirin) .... 2 By Mouth Once Daily 2)  Lisinopril 10 Mg Tabs (Lisinopril) .... One By Mouth Once Daily 3)  Bd U/f Short Pen Needle 31g X 8 Mm Misc (Insulin Pen Needle) .... For Once Daily Use 4)  Glimepiride 1 Mg Tabs (Glimepiride) .... One By Mouth Qd 5)  Cialis 5 Mg Tabs (Tadalafil) .... One By Mouth Once Daily 6)  Vytorin 10-40 Mg Tabs (Ezetimibe-Simvastatin) .... One By Mouth Once Daily For Cholesterol 7)  Januvia 100 Mg Tabs (Sitagliptin Phosphate) .... One By Mouth Once Daily For Diabetes 8)  Actos 30 Mg Tabs (Pioglitazone Hcl) .... One By Mouth Once Daily For Diabetes 9)  Levemir Flexpen 100 Unit/ml Soln (Insulin Detemir) .... 20 Unit Subcutaneously Once Daily 10)  Contour Blood Glucose System W/device Kit (Blood Glucose Monitoring Suppl) .... Use Two Times A Day As Directed 11)  Bayer Contour Test  Strp (Glucose Blood) .... Use Two Times A Day As Directed  Allergies (verified): No Known Drug Allergies  Past History:  Past Medical History: Last updated: 04/10/2008 Diabetes mellitus, type II uncontrolled Hypertension Hyperlipidemia  Erectile dysfunction.   Past Surgical History: Last updated: 03/15/2009 Denies surgical history  Family History: Last updated: 04/10/2008 Thoracic aneurysm - mother (deceased) Throat cancer - father (deceased)   Social History: Last updated: 04/10/2008 Married to [name] for 37 yrs. 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 Thoracic aneurysm - mother (deceased) Throat cancer - father (deceased)   Social History: Reviewed history from 04/10/2008 and no changes required. Married to [name] for 37 yrs. 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   GU:  Complains of erectile dysfunction; denies decreased libido, discharge, dysuria, hematuria, incontinence, nocturia, urinary frequency, and urinary hesitancy. Endo:  Denies cold intolerance, excessive hunger, excessive thirst, excessive urination, heat intolerance, polyuria, and weight change.  Physical Exam  General:  alert, well-developed, well-nourished, well-hydrated, appropriate dress, normal appearance, healthy-appearing, and overweight-appearing.   Head:  normocephalic, atraumatic, no abnormalities observed, and no abnormalities palpated.   Eyes:  vision grossly intact and no injection or icterus. Mouth:  Oral mucosa and oropharynx without lesions or exudates.  Teeth in good repair. Neck:  supple, full ROM, no masses, no thyromegaly, no JVD, normal carotid upstroke, no carotid bruits, and no cervical lymphadenopathy.   Lungs:  normal respiratory effort, no intercostal retractions, no accessory muscle use, normal breath sounds, no dullness, no fremitus, no crackles, and no wheezes.   Heart:  normal rate, regular rhythm, no gallop, no rub, no JVD, and Grade   1/6 systolic ejection murmur.   Abdomen:  soft, non-tender, normal bowel sounds, no distention, no masses, no  guarding, no rigidity, no rebound tenderness, no abdominal hernia, no inguinal hernia, no hepatomegaly, and no splenomegaly.   Msk:  normal ROM, no joint tenderness, no joint swelling, no joint warmth, no redness over joints, no joint deformities, no joint instability, and no crepitation.   Pulses:  R and L carotid,radial,femoral,dorsalis pedis and posterior tibial pulses are full and equal bilaterally Extremities:  No clubbing, cyanosis, edema, or deformity noted with normal full range of motion of all joints.   Neurologic:  No cranial nerve deficits noted. Station and gait are normal. Plantar reflexes are down-going bilaterally. DTRs are symmetrical throughout. Sensory, motor and coordinative functions appear intact. Skin:  Intact without suspicious lesions or rashes Cervical Nodes:  no anterior cervical adenopathy and no posterior cervical adenopathy.   Axillary Nodes:  no R axillary adenopathy and no L axillary adenopathy.   Inguinal Nodes:  no R inguinal adenopathy and no L inguinal adenopathy.   Psych:  Cognition and judgment appear intact. Alert and cooperative with normal attention span and concentration. No apparent delusions, illusions, hallucinations  Diabetes Management Exam:    Foot Exam (with socks and/or shoes not present):       Sensory-Pinprick/Light touch:          Left medial foot (L-4): normal          Left dorsal foot (L-5): normal          Left lateral foot (S-1): normal          Right medial foot (L-4): normal          Right dorsal foot (L-5): normal          Right lateral foot (S-1): normal       Sensory-Monofilament:          Left foot: normal          Right foot: normal       Inspection:          Left foot: normal          Right foot: normal       Nails:          Left foot: normal          Right foot: normal   Impression & Recommendations:  Problem # 1:  CARDIAC MURMUR (ICD-785.2) Assessment New  Orders: Echo Referral (Echo)  EKG obtained (see  interpretation); Will refer to cardiology for evaluation of this murmur.   Problem # 2:  HYPERTROPHY PROSTATE W/UR OBST & OTH LUTS (ICD-600.01) Assessment: Unchanged  Orders: Venipuncture (40981) TLB-Lipid Panel (80061-LIPID) TLB-BMP (Basic Metabolic Panel-BMET) (80048-METABOL) TLB-CBC Platelet - w/Differential (85025-CBCD) TLB-Hepatic/Liver Function Pnl (80076-HEPATIC) TLB-TSH (Thyroid Stimulating Hormone) (84443-TSH) TLB-PSA (Prostate Specific Antigen) (84153-PSA) TLB-Udip w/ Micro (81001-URINE) TLB-A1C / Hgb A1C (Glycohemoglobin) (83036-A1C)  Problem # 3:  ERECTILE DYSFUNCTION (ICD-302.72) Assessment: Unchanged  His updated medication list for this problem includes:    Cialis 5 Mg Tabs (Tadalafil) ..... One by mouth once daily  Problem # 4:  DIABETES MELLITUS, TYPE II, UNCONTROLLED (ICD-250.02) Assessment: Unchanged  His updated medication list for this problem includes:    Aspirin Ec 81 Mg Tbec (Aspirin) .[name] Kitchen... 2 by mouth once daily    Lisinopril 10 Mg Tabs (Lisinopril) ..... One by mouth once daily    Glimepiride 1 Mg Tabs (Glimepiride) ..... One by mouth qd    Januvia 100 Mg Tabs (Sitagliptin phosphate) ..... One by mouth once daily for diabetes    Actos 30 Mg Tabs (Pioglitazone hcl) ..... One by mouth once daily for diabetes    Levemir Flexpen 100 Unit/ml Soln (Insulin detemir) .[name] Kitchen... 20 unit subcutaneously once daily  Orders: Venipuncture (19147) TLB-Lipid Panel (80061-LIPID) TLB-BMP (Basic Metabolic Panel-BMET) (80048-METABOL) TLB-CBC Platelet - w/Differential (85025-CBCD) TLB-Hepatic/Liver Function Pnl (80076-HEPATIC) TLB-TSH (Thyroid Stimulating Hormone) (84443-TSH) TLB-PSA (Prostate Specific Antigen) (84153-PSA) TLB-Udip w/ Micro (81001-URINE) TLB-A1C / Hgb A1C (Glycohemoglobin) (83036-A1C)  Labs Reviewed: Creat: 1.0 (12/27/2009)     Last Eye Exam: mild diabetic retinopathy (07/13/2009) Reviewed HgBA1c results: 9.5 (12/27/2009)  9.9 (08/30/2009)  Problem #  5:  HYPERTENSION (ICD-401.9) Assessment: Improved  His updated medication list for this problem includes:    Lisinopril 10 Mg Tabs (Lisinopril) ..... One by mouth once daily  Orders: Venipuncture (82956) TLB-Lipid Panel (80061-LIPID) TLB-BMP (Basic Metabolic Panel-BMET) (80048-METABOL) TLB-CBC Platelet - w/Differential (85025-CBCD) TLB-Hepatic/Liver Function Pnl (80076-HEPATIC) TLB-TSH (Thyroid Stimulating Hormone) (84443-TSH) TLB-PSA (Prostate Specific Antigen) (84153-PSA) TLB-Udip w/ Micro (81001-URINE) TLB-A1C / Hgb A1C (Glycohemoglobin) (83036-A1C)  BP today: 122/70 Prior BP: 130/80 (02/02/2010)  Prior 10 Yr Risk Heart Disease: Not enough information (03/15/2009)  Labs Reviewed: K+: 4.6 (12/27/2009) Creat: : 1.0 (12/27/2009)   Chol: 232 (08/30/2009)   HDL: 115.30 (08/30/2009)   LDL: DEL (01/07/2008)   TG: 80.0 (08/30/2009)  Complete Medication List: 1)  Aspirin Ec 81 Mg Tbec (Aspirin) .... 2 by mouth once daily 2)  Lisinopril 10 Mg Tabs (Lisinopril) .... One by mouth once daily 3)  Bd U/f Short Pen Needle 31g X 8 Mm Misc (Insulin pen needle) .... For once daily use 4)  Glimepiride 1 Mg Tabs (Glimepiride) .... One by mouth qd 5)  Cialis 5 Mg Tabs (Tadalafil) .... One by mouth once daily 6)  Vytorin 10-40 Mg Tabs (Ezetimibe-simvastatin) .... One by mouth once daily for cholesterol 7)  Januvia 100 Mg Tabs (Sitagliptin phosphate) .... One by mouth once daily for diabetes 8)  Actos 30 Mg Tabs (Pioglitazone hcl) .... One by mouth once daily for diabetes 9)  Levemir Flexpen 100 Unit/ml Soln (Insulin detemir) .... 20 unit subcutaneously once daily 10)  Contour Blood Glucose System W/device Kit (Blood glucose monitoring suppl) .... Use two times a day as directed 11)  Bayer Contour Test Strp (Glucose blood) .... Use two times a day as directed  Other Orders: Flu Vaccine 24yrs + (21308) Admin 1st Vaccine (65784)  Patient Instructions: 1)  Please schedule  a follow-up appointment  in 4 months. 2)  It is important that you exercise regularly at least 20 minutes 5 times a week. If you develop chest pain, have severe difficulty breathing, or feel very tired , stop exercising immediately and seek medical attention. 3)  You need to lose weight. Consider a lower calorie diet and regular exercise.  4)  Check your blood sugars regularly. If your readings are usually above 200 or below 70 you should contact our office. 5)  It is important that your Diabetic A1c level is checked every 3 months. 6)  See your eye doctor yearly to check for diabetic eye damage. 7)  Check your feet each night for sore areas, calluses or signs of infection. 8)  Check your Blood Pressure regularly. If it is above 130/80: you should make an appointment. Prescriptions: CIALIS 5 MG TABS (TADALAFIL) One by mouth once daily  #30 x 0   Entered and Authorized by:   Etta Grandchild MD   Signed by:   Etta Grandchild MD on 04/06/2010   Method used:   Samples Given   RxID:   [id number]    Orders Added: 1)  Venipuncture [36415] 2)  TLB-Lipid Panel [80061-LIPID] 3)  TLB-BMP (Basic Metabolic Panel-BMET) [80048-METABOL] 4)  TLB-CBC Platelet - w/Differential [85025-CBCD] 5)  TLB-Hepatic/Liver Function Pnl [80076-HEPATIC] 6)  TLB-TSH (Thyroid Stimulating Hormone) [84443-TSH] 7)  TLB-PSA (Prostate Specific Antigen) [84153-PSA] 8)  TLB-Udip w/ Micro [81001-URINE] 9)  TLB-A1C / Hgb A1C (Glycohemoglobin) [83036-A1C] 10)  Flu Vaccine 75yrs + [90658] 11)  Admin 1st Vaccine [90471] 12)  Echo Referral [Echo] 13)  Est. Patient Level IV [29562]   Immunizations Administered:  Influenza Vaccine # 1:    Vaccine Type: Fluvax 3+    Site: left deltoid    Mfr: Sanofi Pasteur    Dose: 0.5 ml    Route: IM    Given by: Ami Bullins CMA    Exp. Date: 08/13/2010    Lot #: [id number]    VIS given: 09/07/09 version given April 06, 2010.  Flu Vaccine Consent Questions:    Do you have a history of severe allergic  reactions to this vaccine? no    Any prior history of allergic reactions to egg and/or gelatin? no    Do you have a sensitivity to the preservative Thimersol? no    Do you have a past history of Guillan-Barre Syndrome? no    Do you currently have an acute febrile illness? no    Have you ever had a severe reaction to latex? no    Vaccine information given and explained to patient? yes   Immunizations Administered:  Influenza Vaccine # 1:    Vaccine Type: Fluvax 3+    Site: left deltoid    Mfr: Sanofi Pasteur    Dose: 0.5 ml    Route: IM    Given by: Ami Bullins CMA    Exp. Date: 08/13/2010    Lot #: [id number]    VIS given: 09/07/09 version given April 06, 2010.  Laboratory Results   Blood Tests     CBG Random:: 121mg /dL

## 2010-04-12 NOTE — Letter (Signed)
Summary: Lipid Letter  [facility]  [address] [place], [place] [address]   Phone: [phone]  Fax: [phone]    04/07/2010  [name] [address] [place], [place]  [address]  Dear [name]:  We have carefully reviewed your last lipid profile from 01/07/2008 and the results are noted below with a summary of recommendations for lipid management.    Cholesterol:       208     Goal: <200   HDL "good" Cholesterol:   578.46     Goal: >40   LDL "bad" Cholesterol:   92     Goal: <100   Triglycerides:       54.0     Goal: <150        TLC Diet (Therapeutic Lifestyle Change): Saturated Fats & Transfatty acids should be kept < 7% of total calories ***Reduce Saturated Fats Polyunstaurated Fat can be up to 10% of total calories Monounsaturated Fat Fat can be up to 20% of total calories Total Fat should be no greater than 25-35% of total calories Carbohydrates should be 50-60% of total calories Protein should be approximately 15% of total calories Fiber should be at least 20-30 grams a day ***Increased fiber may help lower LDL Total Cholesterol should be < 200mg /day Consider adding plant stanol/sterols to diet (example: Benacol spread) ***A higher intake of unsaturated fat may reduce Triglycerides and Increase HDL    Adjunctive Measures (may lower LIPIDS and reduce risk of Heart Attack) include: Aerobic Exercise (20-30 minutes 3-4 times a week) Limit Alcohol Consumption Weight Reduction Aspirin 75-81 mg a day by mouth (if not allergic or contraindicated) Dietary Fiber 20-30 grams a day by mouth     Current Medications: 1)    Aspirin Ec 81 Mg Tbec (Aspirin) .... 2 by mouth once daily 2)    Lisinopril 10 Mg Tabs (Lisinopril) .... One by mouth once daily 3)    Bd U/f Short Pen Needle 31g X 8 Mm Misc (Insulin pen needle) .... For once daily use 4)    Glimepiride 1 Mg Tabs (Glimepiride) .... One by mouth qd 5)    Cialis 5 Mg Tabs (Tadalafil) .... One by mouth  once daily 6)    Vytorin 10-40 Mg Tabs (Ezetimibe-simvastatin) .... One by mouth once daily for cholesterol 7)    Januvia 100 Mg Tabs (Sitagliptin phosphate) .... One by mouth once daily for diabetes 8)    Actos 30 Mg Tabs (Pioglitazone hcl) .... One by mouth once daily for diabetes 9)    Levemir Flexpen 100 Unit/ml Soln (Insulin detemir) .... 20 unit subcutaneously once daily 10)    Contour Blood Glucose System W/device Kit (Blood glucose monitoring suppl) .... Use two times a day as directed 11)    Bayer Contour Test  Strp (Glucose blood) .... Use two times a day as directed  If you have any questions, please call. We appreciate being able to work with you.   Sincerely,    [name] Etta Grandchild MD

## 2010-04-12 NOTE — Letter (Signed)
Summary: Results Follow-up Letter  [facility]  [address] [place], [place] [address]   Phone: [phone]  Fax: [phone]    04/07/2010  [address] [facility] [place], [place]  [address]  [place]  Dear Mr. Tolin,   The following are the results of your recent test(s):  Test     Result     Liver enzymes   elevated Blood sugars   too high CBC       high WBC Urine       normal Kidney     normal Thyroid     normal Prostate     normal  _________________________________________________________  Please call for an appointment soon _________________________________________________________ _________________________________________________________ _________________________________________________________  Sincerely,  Sanda Linger MD [facility]

## 2010-04-14 ENCOUNTER — Ambulatory Visit (HOSPITAL_COMMUNITY): Payer: BC Managed Care – PPO | Attending: Internal Medicine

## 2010-04-14 DIAGNOSIS — I080 Rheumatic disorders of both mitral and aortic valves: Secondary | ICD-10-CM | POA: Insufficient documentation

## 2010-04-14 DIAGNOSIS — R011 Cardiac murmur, unspecified: Secondary | ICD-10-CM

## 2010-04-14 DIAGNOSIS — I10 Essential (primary) hypertension: Secondary | ICD-10-CM | POA: Insufficient documentation

## 2010-04-14 DIAGNOSIS — I379 Nonrheumatic pulmonary valve disorder, unspecified: Secondary | ICD-10-CM | POA: Insufficient documentation

## 2010-04-14 DIAGNOSIS — E119 Type 2 diabetes mellitus without complications: Secondary | ICD-10-CM | POA: Insufficient documentation

## 2010-04-14 DIAGNOSIS — I079 Rheumatic tricuspid valve disease, unspecified: Secondary | ICD-10-CM | POA: Insufficient documentation

## 2010-04-18 ENCOUNTER — Telehealth: Payer: Self-pay | Admitting: Internal Medicine

## 2010-04-25 ENCOUNTER — Telehealth: Payer: Self-pay | Admitting: Internal Medicine

## 2010-04-26 NOTE — Progress Notes (Signed)
Summary: RESULTS  Phone Note Call from Patient Call back at [phone]   Summary of Call: Patient is requesting results of echo Initial call taken by: Lamar Sprinkles, CMA,  April 18, 2010 12:14 PM  Follow-up for Phone Call        mild aortic stenosis thick left heart muscle poor heart muscle relaxation Follow-up by: Etta Grandchild MD,  April 18, 2010 12:42 PM  Additional Follow-up for Phone Call Additional follow up Details #1::        Should pt do anything different due to results? Is f/u in June soon enough?  Additional Follow-up by: Lamar Sprinkles, CMA,  April 19, 2010 11:00 AM    Additional Follow-up for Phone Call Additional follow up Details #2::    no, June is fine Follow-up by: Etta Grandchild MD,  April 19, 2010 11:41 AM  Additional Follow-up for Phone Call Additional follow up Details #3:: Details for Additional Follow-up Action Taken: Pt informed  Additional Follow-up by: Lamar Sprinkles, CMA,  April 19, 2010 5:56 PM

## 2010-04-28 ENCOUNTER — Telehealth: Payer: Self-pay | Admitting: Internal Medicine

## 2010-05-03 LAB — URINALYSIS, ROUTINE W REFLEX MICROSCOPIC
Bilirubin Urine: NEGATIVE
Glucose, UA: >1000 mg/dL — AB
Ketones, ur: 15 mg/dL — AB
Leukocytes, UA: NEGATIVE
Nitrite: NEGATIVE
Specific Gravity, Urine: 1.036 — ABNORMAL HIGH (ref 1.005–1.030)
pH: 5.5 (ref 5.0–8.0)

## 2010-05-03 LAB — CBC
MCHC: 34.3 g/dL (ref 30.0–36.0)
MCV: 93.2 fL (ref 78.0–100.0)
Platelets: 237 10*3/uL (ref 150–400)
RBC: 4.79 MIL/uL (ref 4.22–5.81)
RDW: 13 % (ref 11.5–15.5)

## 2010-05-03 LAB — DIFFERENTIAL
Eosinophils Absolute: 0 10*3/uL (ref 0.0–0.7)
Eosinophils Relative: 0 % (ref 0–5)
Lymphocytes Relative: 9 % — ABNORMAL LOW (ref 12–46)
Lymphs Abs: 1.4 10*3/uL (ref 0.7–4.0)
Monocytes Relative: 5 % (ref 3–12)

## 2010-05-03 LAB — GLUCOSE, CAPILLARY: Glucose-Capillary: 251 mg/dL — ABNORMAL HIGH (ref 70–99)

## 2010-05-03 LAB — COMPREHENSIVE METABOLIC PANEL
ALT: 30 U/L (ref 0–53)
AST: 25 U/L (ref 0–37)
Calcium: 9.9 mg/dL (ref 8.4–10.5)
Creatinine, Ser: 0.96 mg/dL (ref 0.4–1.5)
GFR calc Af Amer: >60 mL/min (ref >60)
Sodium: 139 mEq/L (ref 135–145)
Total Protein: 8.4 g/dL — ABNORMAL HIGH (ref 6.0–8.3)

## 2010-05-03 LAB — URINE MICROSCOPIC-ADD ON

## 2010-05-03 LAB — LIPASE, BLOOD: Lipase: 26 U/L (ref 11–59)

## 2010-05-03 NOTE — Progress Notes (Signed)
Summary: RESULTS  Phone Note Call from Patient Call back at [phone]   Summary of Call: Patient is requesting a call regarding recent "heart" test.  Initial call taken by: Lamar Sprinkles, CMA,  April 25, 2010 12:30 PM  Follow-up for Phone Call        Pt advised via VM that he was called with ECHO results 03/05 but to call back with any further questions or concerns. Follow-up by: Margaret Pyle, CMA,  April 26, 2010 11:02 AM

## 2010-05-03 NOTE — Progress Notes (Signed)
Summary: SAMPLES  Phone Note Call from Patient Call back at [phone]   Summary of Call: Patient is requesting samples of insulin prior to weekend if avail.  Initial call taken by: Lamar Sprinkles, CMA,  April 28, 2010 4:53 PM  Follow-up for Phone Call        Pt informed  Follow-up by: Lamar Sprinkles, CMA,  April 29, 2010 11:56 AM    Prescriptions: LEVEMIR FLEXPEN 100 UNIT/ML SOLN (INSULIN DETEMIR) 20 unit subcutaneously once daily  #1 pen x 0   Entered by:   Lamar Sprinkles, CMA   Authorized by:   Etta Grandchild MD   Signed by:   Lamar Sprinkles, CMA on 04/29/2010   Method used:   Samples Given   RxID:   [id number]

## 2010-09-13 ENCOUNTER — Telehealth: Payer: Self-pay

## 2010-09-13 NOTE — Telephone Encounter (Signed)
FYI.......Brandon KitchenPatient called c/o L side chest pain with some SOB. He also stated that there is occassional numbness/tingling in extremities. Patient was advise to seek ER

## 2010-11-02 ENCOUNTER — Telehealth: Payer: Self-pay

## 2010-11-02 NOTE — Telephone Encounter (Signed)
Patient called [name] requesting samples of insulin. Returned call back to patient/[name] to call back. Per system pt has appt friday

## 2010-11-03 ENCOUNTER — Encounter: Payer: Self-pay | Admitting: *Deleted

## 2010-11-04 ENCOUNTER — Other Ambulatory Visit: Payer: Self-pay | Admitting: Internal Medicine

## 2010-11-04 ENCOUNTER — Encounter: Payer: Self-pay | Admitting: Internal Medicine

## 2010-11-04 ENCOUNTER — Other Ambulatory Visit (INDEPENDENT_AMBULATORY_CARE_PROVIDER_SITE_OTHER): Payer: BC Managed Care – PPO

## 2010-11-04 ENCOUNTER — Ambulatory Visit (INDEPENDENT_AMBULATORY_CARE_PROVIDER_SITE_OTHER): Payer: BC Managed Care – PPO | Admitting: Internal Medicine

## 2010-11-04 VITALS — BP 144/88 | HR 80 | Temp 98.3°F | Resp 16 | Wt 197.2 lb

## 2010-11-04 DIAGNOSIS — IMO0001 Reserved for inherently not codable concepts without codable children: Secondary | ICD-10-CM

## 2010-11-04 DIAGNOSIS — E785 Hyperlipidemia, unspecified: Secondary | ICD-10-CM

## 2010-11-04 DIAGNOSIS — R202 Paresthesia of skin: Secondary | ICD-10-CM

## 2010-11-04 DIAGNOSIS — I10 Essential (primary) hypertension: Secondary | ICD-10-CM

## 2010-11-04 DIAGNOSIS — R209 Unspecified disturbances of skin sensation: Secondary | ICD-10-CM

## 2010-11-04 LAB — COMPREHENSIVE METABOLIC PANEL
Albumin: 4.4 g/dL (ref 3.5–5.2)
Alkaline Phosphatase: 57 U/L (ref 39–117)
BUN: 14 mg/dL (ref 6–23)
CO2: 28 mEq/L (ref 19–32)
Calcium: 9.4 mg/dL (ref 8.4–10.5)
Chloride: 102 mEq/L (ref 96–112)
Glucose, Bld: 154 mg/dL — ABNORMAL HIGH (ref 70–99)
Potassium: 4.5 mEq/L (ref 3.5–5.1)
Sodium: 138 mEq/L (ref 135–145)
Total Protein: 7.7 g/dL (ref 6.0–8.3)

## 2010-11-04 LAB — TSH: TSH: 1.44 u[IU]/mL (ref 0.35–5.50)

## 2010-11-04 LAB — CBC WITH DIFFERENTIAL/PLATELET
Basophils Relative: 0.3 % (ref 0.0–3.0)
Eosinophils Relative: 0.7 % (ref 0.0–5.0)
Hemoglobin: 13.8 g/dL (ref 13.0–17.0)
Lymphocytes Relative: 34.9 % (ref 12.0–46.0)
Monocytes Relative: 8.4 % (ref 3.0–12.0)
Neutro Abs: 4.3 10*3/uL (ref 1.4–7.7)
Neutrophils Relative %: 55.7 % (ref 43.0–77.0)
RBC: 4.38 Mil/uL (ref 4.22–5.81)
WBC: 7.8 10*3/uL (ref 4.5–10.5)

## 2010-11-04 LAB — LIPID PANEL
HDL: 97.9 mg/dL (ref >39.00)
VLDL: 7.6 mg/dL (ref 0.0–40.0)

## 2010-11-04 MED ORDER — SITAGLIPTIN PHOSPHATE 100 MG PO TABS: 100.0000 mg | ORAL_TABLET | Freq: Every day | ORAL | Status: DC

## 2010-11-04 MED ORDER — INSULIN DETEMIR 100 UNIT/ML ~~LOC~~ SOLN: 20.0000 [IU] | Freq: Every day | SUBCUTANEOUS | Status: DC

## 2010-11-04 MED ORDER — GLIMEPIRIDE 1 MG PO TABS
1.0000 mg | ORAL_TABLET | Freq: Every day | ORAL | Status: DC
Start: 2010-11-04 — End: 2011-05-23

## 2010-11-04 MED ORDER — EZETIMIBE-SIMVASTATIN 10-40 MG PO TABS: 1.0000 | ORAL_TABLET | Freq: Every day | ORAL | Status: DC

## 2010-11-04 MED ORDER — LISINOPRIL 10 MG PO TABS: 10.0000 mg | ORAL_TABLET | Freq: Every day | ORAL | Status: DC

## 2010-11-04 MED ORDER — GLUCOSE BLOOD VI STRP: ORAL_STRIP | Status: DC

## 2010-11-04 MED ORDER — PIOGLITAZONE HCL 30 MG PO TABS: 30.0000 mg | ORAL_TABLET | Freq: Every day | ORAL | Status: DC

## 2010-11-04 NOTE — Progress Notes (Signed)
Subjective:    Patient ID: [name], male    DOB: [birth year], 65 y.o.   MRN: [id number]  Diabetes He presents for his follow-up diabetic visit. He has type 2 diabetes mellitus. There are no hypoglycemic associated symptoms. Pertinent negatives for hypoglycemia include no dizziness, headaches, pallor, seizures, speech difficulty, sweats or tremors. Associated symptoms include foot paresthesias (left foot). Pertinent negatives for diabetes include no blurred vision, no chest pain, no fatigue, no foot ulcerations, no polydipsia, no polyphagia, no polyuria, no visual change, no weakness and no weight loss. There are no hypoglycemic complications. Symptoms are worsening. Symptoms have been present for 2 months. Diabetic complications include impotence. Current diabetic treatment includes oral agent (dual therapy) and intensive insulin program. He is compliant with treatment all of the time. His weight is stable. He is following a generally healthy diet. Meal planning includes avoidance of concentrated sweets. He has not had a previous visit with a dietician. He never participates in exercise. His home blood glucose trend is increasing steadily. His breakfast blood glucose range is generally 140-180 mg/dl. His lunch blood glucose range is generally 140-180 mg/dl. His dinner blood glucose range is generally 140-180 mg/dl. His highest blood glucose is 140-180 mg/dl. His overall blood glucose range is 140-180 mg/dl. An ACE inhibitor/angiotensin II receptor blocker is being taken. He does not see a podiatrist.Eye exam is current.  Hypertension This is a chronic problem. The current episode started more than 1 year ago. The problem has been gradually improving since onset. The problem is controlled. Pertinent negatives include no anxiety, blurred vision, chest pain, headaches, malaise/fatigue, neck pain, orthopnea, palpitations, peripheral edema, PND, shortness of breath or sweats. There are no associated agents  to hypertension. Past treatments include angiotensin blockers. The current treatment provides moderate improvement. Compliance problems include exercise and diet.       Review of Systems  Constitutional: Negative for fever, chills, weight loss, malaise/fatigue, diaphoresis, activity change, appetite change and fatigue.  HENT: Negative.  Negative for neck pain.   Eyes: Negative.  Negative for blurred vision.  Respiratory: Negative for apnea, cough, choking, chest tightness, shortness of breath, wheezing and stridor.   Cardiovascular: Negative for chest pain, palpitations, orthopnea, leg swelling and PND.  Gastrointestinal: Negative for nausea, vomiting, abdominal pain, diarrhea, constipation, blood in stool, abdominal distention, anal bleeding and rectal pain.  Genitourinary: Positive for impotence. Negative for dysuria, urgency, polyuria, frequency, hematuria, flank pain, decreased urine volume, enuresis and difficulty urinating.  Musculoskeletal: Negative for myalgias, back pain, joint swelling, arthralgias and gait problem.  Skin: Negative for color change, pallor, rash and wound.  Neurological: Positive for numbness (left foot). Negative for dizziness, tremors, seizures, syncope, facial asymmetry, speech difficulty, weakness, light-headedness and headaches.  Hematological: Negative for polydipsia, polyphagia and adenopathy. Does not bruise/bleed easily.  Psychiatric/Behavioral: Negative.        Objective:   Physical Exam  Vitals reviewed. Constitutional: He is oriented to person, place, and time. He appears well-developed and well-nourished. No distress.  HENT:  Mouth/Throat: Oropharynx is clear and moist. No oropharyngeal exudate.  Eyes: Conjunctivae are normal. Right eye exhibits no discharge. Left eye exhibits no discharge. No scleral icterus.  Neck: Normal range of motion. Neck supple. No JVD present. No tracheal deviation present. No thyromegaly present.  Cardiovascular: Normal  rate, regular rhythm and intact distal pulses.  Exam reveals no gallop and no friction rub.   No murmur heard. Pulmonary/Chest: Effort normal and breath sounds normal. No stridor. No respiratory distress. He has  no wheezes. He has no rales. He exhibits no tenderness.  Abdominal: Soft. Bowel sounds are normal. He exhibits no distension and no mass. There is no tenderness. There is no rebound and no guarding.  Musculoskeletal: Normal range of motion. He exhibits no edema and no tenderness.  Lymphadenopathy:    He has no cervical adenopathy.  Neurological: He is alert and oriented to person, place, and time. He has normal strength. He is not disoriented. He displays no atrophy, no tremor and normal reflexes. No cranial nerve deficit or sensory deficit. He exhibits normal muscle tone. He displays a negative Romberg sign. He displays no seizure activity. Coordination and gait normal. He displays no Babinski's sign on the right side. He displays no Babinski's sign on the left side.  Reflex Scores:      Tricep reflexes are 1+ on the right side and 1+ on the left side.      Bicep reflexes are 1+ on the right side and 1+ on the left side.      Brachioradialis reflexes are 1+ on the right side and 1+ on the left side.      Patellar reflexes are 1+ on the right side and 1+ on the left side.      Achilles reflexes are 1+ on the right side and 1+ on the left side. Skin: Skin is warm and dry. No rash noted. He is not diaphoretic. No erythema. No pallor.  Psychiatric: He has a normal mood and affect. His behavior is normal. Judgment normal.      Lab Results  Component Value Date   WBC 11.3* 04/06/2010   HGB 13.6 04/06/2010   HCT 40.3 04/06/2010   PLT 189.0 04/06/2010   CHOL 208* 04/06/2010   TRIG 54.0 04/06/2010   HDL 100.00 04/06/2010   LDLDIRECT 91.4 04/06/2010   ALT 57* 04/06/2010   AST 46* 04/06/2010   NA 139 04/06/2010   K 4.8 04/06/2010   CL 103 04/06/2010   CREATININE 1.0 04/06/2010   BUN 13 04/06/2010     CO2 29 04/06/2010   TSH 2.02 04/06/2010   PSA 0.61 04/06/2010   HGBA1C 9.0* 04/06/2010   MICROALBUR 9.4* 03/15/2009      Assessment & Plan:

## 2010-11-04 NOTE — Patient Instructions (Signed)
Diabetes, Type 2 Diabetes is a lasting (chronic) disease. In type 2 diabetes, the pancreas does not make enough insulin (a hormone), and the body does not respond normally to the insulin that is made. This type of diabetes was also previously called adult onset diabetes. About 90% of all those who have diabetes have type 2. It usually occurs after the age of 40 but can occur at any age. CAUSES Unlike type 1 diabetes, which happens because insulin is no longer being made, type 2 diabetes happens because the body is making less insulin and has trouble using the insulin properly. SYMPTOMS  Drinking more than usual.   Urinating more than usual.   Blurred vision.   Dry, itchy skin.   Frequent infection like yeast infections in women.   More tired than usual (fatigue).  TREATMENT  Healthy eating.   Exercise.   Medication, if needed.   Monitoring blood glucose (sugar).   Seeing your caregiver regularly.  HOME CARE INSTRUCTIONS  Check your blood glucose (sugar) at least once daily. More frequent monitoring may be necessary, depending on your medications and on how well your diabetes is controlled. Your caregiver will advise you.   Take your medicine as directed by your caregiver.   Do not smoke.   Make wise food choices. Ask your caregiver for information. Weight loss can improve your diabetes.   Learn about low blood glucose (hypoglycemia) and how to treat it.   Get your eyes checked regularly.   Have a yearly physical exam. Have your blood pressure checked. Get your blood and urine tested.   Wear a pendant or bracelet saying that you have diabetes.   Check your feet every night for sores. Let your caregiver know if you have sores that are not healing.  SEEK MEDICAL CARE IF:  You are having problems keeping your blood glucose at target range.   You feel you might be having problems with your medicines.   You have symptoms of an illness that is not improving after 24  hours.   You have a sore or wound that is not healing.   You notice a change in vision or a new problem with your vision.   You develop a fever of more than 100.5.  Document Released: 01/30/2005 Document Re-Released: 02/21/2009 [employer] Patient Information 2011 [employer]. 

## 2010-11-06 ENCOUNTER — Encounter: Payer: Self-pay | Admitting: Internal Medicine

## 2010-11-06 NOTE — Assessment & Plan Note (Signed)
His BP is well controlled 

## 2010-11-06 NOTE — Assessment & Plan Note (Signed)
I will check his A1C and will monitor his renal function 

## 2010-11-06 NOTE — Assessment & Plan Note (Signed)
I will check labs to look for secondary causes and will get a NCS and EMG done for diagnostic purposes as well

## 2010-11-06 NOTE — Assessment & Plan Note (Signed)
He is doing well on vytorin 

## 2010-11-07 ENCOUNTER — Ambulatory Visit: Payer: BC Managed Care – PPO | Admitting: Internal Medicine

## 2010-11-08 LAB — PROTEIN ELECTROPHORESIS, SERUM
Alpha-2-Globulin: 8.7 % (ref 7.1–11.8)
Beta 2: 5.8 % (ref 3.2–6.5)
Beta Globulin: 5.5 % (ref 4.7–7.2)
Total Protein, Serum Electrophoresis: 7.7 g/dL (ref 6.0–8.3)

## 2010-11-14 ENCOUNTER — Encounter: Payer: Self-pay | Admitting: Internal Medicine

## 2011-02-03 ENCOUNTER — Ambulatory Visit: Payer: BC Managed Care – PPO | Admitting: Internal Medicine

## 2011-03-09 ENCOUNTER — Emergency Department (INDEPENDENT_AMBULATORY_CARE_PROVIDER_SITE_OTHER)
Admission: EM | Admit: 2011-03-09 | Discharge: 2011-03-09 | Disposition: A | Discharge status: Home / Self Care | Payer: Self-pay | Source: Home / Self Care | Attending: Family Medicine | Admitting: Family Medicine

## 2011-03-09 ENCOUNTER — Encounter (HOSPITAL_COMMUNITY): Payer: Self-pay | Admitting: *Deleted

## 2011-03-09 DIAGNOSIS — IMO0002 Reserved for concepts with insufficient information to code with codable children: Secondary | ICD-10-CM

## 2011-03-09 DIAGNOSIS — S83419A Sprain of medial collateral ligament of unspecified knee, initial encounter: Secondary | ICD-10-CM

## 2011-03-09 DIAGNOSIS — M7050 Other bursitis of knee, unspecified knee: Secondary | ICD-10-CM

## 2011-03-09 MED ORDER — PREDNISONE (PAK) 10 MG PO TABS: ORAL_TABLET | ORAL | Status: AC
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
## 2011-03-17 ENCOUNTER — Telehealth: Payer: Self-pay | Admitting: Internal Medicine

## 2011-03-17 NOTE — Telephone Encounter (Signed)
Pt is req to change pcps from Dr Yetta Barre to Dr Artist Pais. Pt used to see Dr Artist Pais at [facility]. Pls advise if ok.

## 2011-03-17 NOTE — Telephone Encounter (Signed)
Ok with me 

## 2011-03-17 NOTE — Telephone Encounter (Signed)
yes

## 2011-03-20 NOTE — Telephone Encounter (Signed)
Called pt and told im that both pcps have agreed to change of pcp. Pt has been sch a pt-est ov with Dr Artist Pais on Wed 03/21/10, as noted.

## 2011-03-22 ENCOUNTER — Ambulatory Visit (INDEPENDENT_AMBULATORY_CARE_PROVIDER_SITE_OTHER): Payer: BC Managed Care – PPO | Admitting: Internal Medicine

## 2011-03-22 ENCOUNTER — Encounter: Payer: Self-pay | Admitting: Internal Medicine

## 2011-03-22 DIAGNOSIS — R0789 Other chest pain: Secondary | ICD-10-CM

## 2011-03-22 DIAGNOSIS — L989 Disorder of the skin and subcutaneous tissue, unspecified: Secondary | ICD-10-CM

## 2011-03-22 DIAGNOSIS — IMO0001 Reserved for inherently not codable concepts without codable children: Secondary | ICD-10-CM

## 2011-03-22 DIAGNOSIS — R079 Chest pain, unspecified: Secondary | ICD-10-CM

## 2011-03-22 NOTE — Assessment & Plan Note (Signed)
A 66 year old demented male with atypical left lower rib chest pain. His symptoms seem to be coming from musculoskeletal cause however intermittent shortness of breath and diaphoresis symptoms are concerning given his multiple risk factors.  Refer to cardiology for further evaluation. Obtain CT of chest.

## 2011-03-22 NOTE — Progress Notes (Signed)
Subjective:    Patient ID: [name], male    DOB: 1945-09-29, 66 y.o.   MRN: [id number]  HPI  66 year old African American male with history of type 2 diabetes, hypertension,  hyperlipidemia complains of atypical chest pain of the last several months. He has complained of this to his previous primary care physician Dr. Yetta Mathis. He describes a pins and needle sensation in his left lower ribs and breast area. Area can be tender to palpation. However he sometimes reports feeling diaphoretic with chest discomfort. His symptoms have also been triggered with going up a flight of stairs.  Chart review notes normal myoview in 02/2010.  Review of Systems Intermittent shortness of breath  Past Medical History  Diagnosis Date  . Diabetes mellitus, type 2   . Hypertension   . Hyperlipidemia   . Erectile dysfunction     History   Social History  . Marital Status: Married    Spouse Name: N/A    Number of Children: N/A  . Years of Education: N/A   Occupational History  . Not on file.   Social History Main Topics  . Smoking status: Never Smoker   . Smokeless tobacco: Not on file  . Alcohol Use: No  . Drug Use: No  . Sexually Active: Yes   Other Topics Concern  . 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  Family History  Problem Relation Age of Onset  . Cancer Father     Throat cancer    No Known Allergies  Current Outpatient Prescriptions on File Prior to Visit  Medication Sig Dispense Refill  . aspirin 81 MG tablet Take 81 mg by mouth daily.        [name] Kitchen ezetimibe-simvastatin (VYTORIN) 10-40 MG per tablet Take 1 tablet by mouth at bedtime.  90 tablet  3  . glimepiride (AMARYL) 1 MG tablet Take 1 tablet (1 mg total) by mouth daily before breakfast.  90 tablet  3  . glucose blood test strip Use as instructed  100 each  11  .  insulin detemir (LEVEMIR) 100 UNIT/ML injection Inject 20 Units into the skin daily.  10 mL  11  . Insulin Pen Needle (B-D ULTRAFINE III SHORT PEN) 31G X 8 MM MISC 1 applicator by Does not apply route daily.        . pioglitazone (ACTOS) 30 MG tablet Take 1 tablet (30 mg total) by mouth daily.  90 tablet  3  . sitaGLIPtin (JANUVIA) 100 MG tablet Take 1 tablet (100 mg total) by mouth daily.  90 tablet  3  . tadalafil (CIALIS) 5 MG tablet Take 5 mg by mouth daily as needed.          BP 120/90  Pulse 88  Temp(Src) 98.8 F (37.1 C) (Oral)  Wt 195 lb (88.451 kg)  EKGs showed normal sinus rhythm at 85 beats per minute. 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 No significant change compared to previous EKG from 03/15/2009     Objective:   Physical Exam  Constitutional: He is oriented to person, place, and time. He appears well-developed and well-nourished. No distress.  HENT:  Head: Normocephalic and atraumatic.  Neck: Neck supple.  Cardiovascular: Normal rate, regular rhythm and normal heart sounds.        Left lower chest wall tenderness.  No breath mass or chest wall mass  Pulmonary/Chest: Effort normal and breath sounds normal. He has no wheezes. He has no rales.  Abdominal: Soft. Bowel sounds are normal.  Musculoskeletal: He exhibits no edema.  Lymphadenopathy:    He has no cervical adenopathy.  Neurological: He is alert and oriented to person, place, and time. No cranial nerve deficit.  Skin: Skin is warm.  Psychiatric: He has a normal mood and affect. His behavior is normal.       Assessment & Plan:

## 2011-03-22 NOTE — Assessment & Plan Note (Signed)
Stop Januvia.  Decrease dose of Actos.  Continue levemir.  Monitor A1c.

## 2011-03-29 ENCOUNTER — Telehealth: Payer: Self-pay | Admitting: Internal Medicine

## 2011-03-29 DIAGNOSIS — I10 Essential (primary) hypertension: Secondary | ICD-10-CM

## 2011-03-29 NOTE — Telephone Encounter (Signed)
Pt is going to [facility] office 03/30/11

## 2011-03-29 NOTE — Telephone Encounter (Signed)
No answer, no machine.

## 2011-03-29 NOTE — Telephone Encounter (Signed)
Pt called and said that he was returning a call from someone at [facility] that told him that he needed to sch labs prior to his ov on 04/19/11 with Dr Artist Pais. There were no lab orders in epic. Pls advise.

## 2011-03-30 ENCOUNTER — Other Ambulatory Visit (INDEPENDENT_AMBULATORY_CARE_PROVIDER_SITE_OTHER): Payer: BC Managed Care – PPO

## 2011-03-30 DIAGNOSIS — I10 Essential (primary) hypertension: Secondary | ICD-10-CM

## 2011-03-30 LAB — BASIC METABOLIC PANEL
CO2: 29 mEq/L (ref 19–32)
Calcium: 9.5 mg/dL (ref 8.4–10.5)
Chloride: 103 mEq/L (ref 96–112)
Creatinine, Ser: 0.9 mg/dL (ref 0.4–1.5)
Sodium: 138 mEq/L (ref 135–145)

## 2011-03-31 ENCOUNTER — Ambulatory Visit (INDEPENDENT_AMBULATORY_CARE_PROVIDER_SITE_OTHER)
Admission: RE | Admit: 2011-03-31 | Discharge: 2011-03-31 | Disposition: A | Discharge status: Home / Self Care | Payer: BC Managed Care – PPO | Source: Ambulatory Visit | Attending: Internal Medicine | Admitting: Internal Medicine

## 2011-03-31 DIAGNOSIS — R079 Chest pain, unspecified: Secondary | ICD-10-CM

## 2011-03-31 MED ORDER — IOHEXOL 300 MG/ML  SOLN
80.0000 mL | Freq: Once | INTRAMUSCULAR | Status: AC | PRN
  Administered 2011-03-31: 80 mL via INTRAVENOUS

## 2011-04-11 ENCOUNTER — Other Ambulatory Visit: Payer: BC Managed Care – PPO

## 2011-04-12 ENCOUNTER — Encounter: Payer: Self-pay | Admitting: Cardiovascular Disease

## 2011-04-12 ENCOUNTER — Ambulatory Visit (INDEPENDENT_AMBULATORY_CARE_PROVIDER_SITE_OTHER): Payer: BC Managed Care – PPO | Admitting: Cardiovascular Disease

## 2011-04-12 DIAGNOSIS — R0789 Other chest pain: Secondary | ICD-10-CM

## 2011-04-12 DIAGNOSIS — R079 Chest pain, unspecified: Secondary | ICD-10-CM

## 2011-04-12 DIAGNOSIS — E785 Hyperlipidemia, unspecified: Secondary | ICD-10-CM

## 2011-04-12 NOTE — Patient Instructions (Signed)
Your physician recommends that you schedule a follow-up appointment in: 3 MONTHS   Your physician has requested that you have an echocardiogram. Echocardiography is a painless test that uses sound waves to create images of your heart. It provides your doctor with information about the size and shape of your heart and how well your heart's chambers and valves are working. This procedure takes approximately one hour. There are no restrictions for this procedure.  Your physician has requested that you have a lexiscan myoview.  Please follow instruction sheet, as given.  Your physician recommends that you return for a FASTING lipid profile: 3 MONTHS

## 2011-04-12 NOTE — Progress Notes (Signed)
    [name] Date of Birth  09-May-1945 [facility]  [address]    [address]   [address] [place], [place]  [address]    [place], [place]  [address] [phone]  Fax  [phone]  [phone]  Fax [phone]  Problems: 1. Chest pain 2. Hypercholesterolemia 3. Diabetes mellitus 4. 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 Coronary calcifications by CT scan 6. Pulmonary nodule  History of Present Illness:  [name] is a 66 year old gentleman who presents today for further evaluation of his chest pain. These chest pains are described as a left-sided pain. He seemed to come on with rest and exertion.  The pains last for hours.    He has been quite regularly and in fact does have an everyday for the past 3 days.  He does not do any regular exercise.  He works as a Arboriculturist.    Current Outpatient Prescriptions on File Prior to Visit  Medication Sig Dispense Refill  . aspirin 81 MG tablet Take 81 mg by mouth daily.        [name] Kitchen ezetimibe-simvastatin (VYTORIN) 10-40 MG per tablet Take 1 tablet by mouth at bedtime.  90 tablet  3  . glimepiride (AMARYL) 1 MG tablet Take 1 tablet (1 mg total) by mouth daily before breakfast.  90 tablet  3  . glucose blood test strip Use as instructed  100 each  11  . insulin detemir (LEVEMIR) 100 UNIT/ML injection Inject 20 Units into the skin daily.  10 mL  11  . Insulin Pen Needle (B-D ULTRAFINE III SHORT PEN) 31G X 8 MM MISC 1 applicator by Does not apply route daily.        . pioglitazone (ACTOS) 30 MG tablet Take 0.5 tablets (15 mg total) by mouth daily.  90 tablet  3  . tadalafil (CIALIS) 5 MG tablet Take 5 mg by mouth daily as needed.          No Known Allergies  Past Medical History  Diagnosis Date  . Diabetes mellitus, type 2   . Hypertension   . Hyperlipidemia   . Erectile dysfunction     No past surgical history on file.  History  Smoking status  . Never Smoker   Smokeless  tobacco  . Not on file    History  Alcohol Use No    Family History  Problem Relation Age of Onset  . Cancer Father     Throat cancer    Reviw of Systems:  Reviewed in the HPI.  All other systems are negative.  Physical Exam: Blood pressure 149/86, pulse 78, height 6' (1.829 m), weight 190 lb 12.8 oz (86.546 kg). General: Well developed, well nourished, in no acute distress.  Head: Normocephalic, atraumatic, sclera non-icteric, mucus membranes are moist,   Neck: Supple. Carotids are 2 + without bruits. No JVD  Lungs: Clear bilaterally to auscultation.  Heart: regular rate  There is a soft systolic murmur.  Abdomen: Soft, non-tender, non-distended with normal bowel sounds. No hepatomegaly. No rebound/guarding. No masses.  Msk:  Strength and tone are normal  Extremities: No clubbing or cyanosis. No edema.  Distal pedal pulses are 2+ and equal bilaterally.  Neuro: Alert and oriented X 3. Moves all extremities spontaneously.  Psych:  Responds to questions appropriately with a normal affect.  ECG: NSR, frequent PACs  Assessment / Plan:

## 2011-04-13 ENCOUNTER — Encounter: Payer: Self-pay | Admitting: Cardiovascular Disease

## 2011-04-13 NOTE — Assessment & Plan Note (Addendum)
[name] presents with some episodes of chest discomfort.  I've recommended that we proceed with an echocardiogram for further evaluation of his systolic murmur.. Also suggest that we proceed with a Lexiscan Myoview study.  He's having hours of chest tightness and pressure which certainly could be angina.  I seen again in 3 months.

## 2011-04-19 ENCOUNTER — Ambulatory Visit: Payer: BC Managed Care – PPO | Admitting: Internal Medicine

## 2011-04-19 DIAGNOSIS — Z0289 Encounter for other administrative examinations: Secondary | ICD-10-CM

## 2011-04-20 ENCOUNTER — Ambulatory Visit (HOSPITAL_COMMUNITY): Payer: BC Managed Care – PPO | Attending: Cardiovascular Disease | Admitting: Radiology

## 2011-04-20 DIAGNOSIS — I10 Essential (primary) hypertension: Secondary | ICD-10-CM | POA: Insufficient documentation

## 2011-04-20 DIAGNOSIS — Z794 Long term (current) use of insulin: Secondary | ICD-10-CM | POA: Insufficient documentation

## 2011-04-20 DIAGNOSIS — R0602 Shortness of breath: Secondary | ICD-10-CM

## 2011-04-20 DIAGNOSIS — R61 Generalized hyperhidrosis: Secondary | ICD-10-CM | POA: Insufficient documentation

## 2011-04-20 DIAGNOSIS — E119 Type 2 diabetes mellitus without complications: Secondary | ICD-10-CM | POA: Insufficient documentation

## 2011-04-20 DIAGNOSIS — R0789 Other chest pain: Secondary | ICD-10-CM | POA: Insufficient documentation

## 2011-04-20 DIAGNOSIS — R079 Chest pain, unspecified: Secondary | ICD-10-CM

## 2011-04-20 DIAGNOSIS — E785 Hyperlipidemia, unspecified: Secondary | ICD-10-CM | POA: Insufficient documentation

## 2011-04-20 DIAGNOSIS — R000 Tachycardia, unspecified: Secondary | ICD-10-CM | POA: Insufficient documentation

## 2011-04-20 DIAGNOSIS — R002 Palpitations: Secondary | ICD-10-CM | POA: Insufficient documentation

## 2011-04-20 DIAGNOSIS — R9439 Abnormal result of other cardiovascular function study: Secondary | ICD-10-CM

## 2011-04-20 DIAGNOSIS — R0989 Other specified symptoms and signs involving the circulatory and respiratory systems: Secondary | ICD-10-CM | POA: Insufficient documentation

## 2011-04-20 DIAGNOSIS — R0609 Other forms of dyspnea: Secondary | ICD-10-CM | POA: Insufficient documentation

## 2011-04-20 DIAGNOSIS — IMO0001 Reserved for inherently not codable concepts without codable children: Secondary | ICD-10-CM

## 2011-04-20 MED ORDER — TECHNETIUM TC 99M TETROFOSMIN IV KIT
11.0000 | PACK | Freq: Once | INTRAVENOUS | Status: AC | PRN
  Administered 2011-04-20: 11 via INTRAVENOUS

## 2011-04-20 MED ORDER — TECHNETIUM TC 99M TETROFOSMIN IV KIT
33.0000 | PACK | Freq: Once | INTRAVENOUS | Status: AC | PRN
  Administered 2011-04-20: 33 via INTRAVENOUS

## 2011-04-20 MED ORDER — REGADENOSON 0.4 MG/5ML IV SOLN
0.4000 mg | Freq: Once | INTRAVENOUS | Status: AC
  Administered 2011-04-20: 0.4 mg via INTRAVENOUS

## 2011-04-20 NOTE — Progress Notes (Signed)
[facility] SITE 3 NUCLEAR MED [address] [place] [address] [phone]  Cardiology Nuclear Med Study  [name] is a 66 y.o. 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  O2 SAT 97% on RA. IV 0.9% NS with Angio Cath:  20g  IV Site: R Antecubital  IV Started by:  Bonnita Levan, RN  Chest Size (in):  44 Cup Size: n/a  Height: 6' (1.829 m)  Weight:  188 lb (85.276 kg)  BMI:  Body mass index is 25.50 kg/(m^2). Tech Comments:  Patient held all meds this AM, BS @ 5 am = 187    Nuclear Med Study 1 or 2 day study: 1 day  Stress Test Type:  Eugenie Birks  Reading MD: Marca Ancona, MD  Order Authorizing Provider:  Kristeen Miss, MD  Resting Radionuclide: Technetium 35m Tetrofosmin  Resting Radionuclide Dose: 11.0 mCi   Stress Radionuclide:  Technetium 89m Tetrofosmin  Stress Radionuclide Dose: 33.0 mCi           Stress Protocol Rest HR: 65 Stress HR: 88  Rest BP: 119/84 Stress BP: 117/81  Exercise Time (min): n/a METS: n/a   Predicted Max HR: 157 bpm % Max HR: 56.05 bpm Rate Pressure Product: 32951   Dose of Adenosine (mg):  n/a Dose of Lexiscan: 0.4 mg  Dose of Atropine (mg): n/a Dose of Dobutamine: n/a mcg/kg/min (at max HR)  Stress Test Technologist: Smiley Houseman, CMA-N  Nuclear Technologist:  Domenic Polite, CNMT     Rest Procedure:  Myocardial perfusion imaging was performed at rest 45 minutes following the intravenous administration of  Technetium 44m Tetrofosmin.  Rest ECG: Nonspecific T-wave changes in lead III.  Stress Procedure:  The patient received IV Lexiscan 0.4 mg over 15-seconds.  Technetium 53m Tetrofosmin injected at 30-seconds.  There were diffuse nonspecific T-wave changes with Lexiscan.  Quantitative spect images were obtained after a 45 minute delay.  Stress ECG: Nonsignificant T wave changes.   QPS Raw Data Images:  Normal; no motion artifact; normal heart/lung ratio. Stress Images:  Normal homogeneous uptake in all areas of the myocardium. Rest Images:  Normal homogeneous uptake in all areas of the myocardium. Subtraction (SDS):  There is no evidence of scar or ischemia. Transient Ischemic Dilatation (Normal <1.22):  1.14 Lung/Heart Ratio (Normal <0.45):  0.28  Quantitative Gated Spect Images QGS EDV:  114 ml QGS ESV:  53 ml QGS cine images:  NL LV Function; NL Wall Motion QGS EF: 54%  Impression Exercise Capacity:  Lexiscan with no exercise. BP Response:  Hypotensive blood pressure response. Clinical Symptoms:  Short of breath.  ECG Impression:  Nonspecific T wave changes.  Comparison with Prior Nuclear Study: No significant change from previous study  Overall Impression:  Normal stress nuclear study.  Caniyah Murley [employer]

## 2011-04-25 ENCOUNTER — Ambulatory Visit (HOSPITAL_COMMUNITY): Payer: BC Managed Care – PPO

## 2011-05-04 ENCOUNTER — Ambulatory Visit (HOSPITAL_COMMUNITY): Payer: BC Managed Care – PPO | Attending: Cardiovascular Disease

## 2011-05-04 ENCOUNTER — Other Ambulatory Visit: Payer: Self-pay

## 2011-05-04 DIAGNOSIS — R079 Chest pain, unspecified: Secondary | ICD-10-CM | POA: Insufficient documentation

## 2011-05-04 DIAGNOSIS — I10 Essential (primary) hypertension: Secondary | ICD-10-CM | POA: Insufficient documentation

## 2011-05-04 DIAGNOSIS — R071 Chest pain on breathing: Secondary | ICD-10-CM | POA: Insufficient documentation

## 2011-05-04 DIAGNOSIS — R072 Precordial pain: Secondary | ICD-10-CM

## 2011-05-04 DIAGNOSIS — R011 Cardiac murmur, unspecified: Secondary | ICD-10-CM

## 2011-05-04 DIAGNOSIS — E119 Type 2 diabetes mellitus without complications: Secondary | ICD-10-CM | POA: Insufficient documentation

## 2011-05-04 DIAGNOSIS — E785 Hyperlipidemia, unspecified: Secondary | ICD-10-CM | POA: Insufficient documentation

## 2011-05-23 ENCOUNTER — Other Ambulatory Visit: Payer: Self-pay | Admitting: *Deleted

## 2011-05-23 DIAGNOSIS — IMO0001 Reserved for inherently not codable concepts without codable children: Secondary | ICD-10-CM

## 2011-05-23 MED ORDER — INSULIN DETEMIR 100 UNIT/ML ~~LOC~~ SOLN: 20.0000 [IU] | Freq: Every day | SUBCUTANEOUS | Status: DC

## 2011-05-23 MED ORDER — INSULIN PEN NEEDLE 31G X 8 MM MISC: 1.0000 | Freq: Every day | Status: DC

## 2011-05-23 MED ORDER — GLIMEPIRIDE 1 MG PO TABS: 1.0000 mg | ORAL_TABLET | Freq: Every day | ORAL | Status: DC

## 2011-05-23 MED ORDER — GLUCOSE BLOOD VI STRP: ORAL_STRIP | Status: DC

## 2011-06-07 ENCOUNTER — Telehealth: Payer: Self-pay | Admitting: Internal Medicine

## 2011-06-07 DIAGNOSIS — IMO0001 Reserved for inherently not codable concepts without codable children: Secondary | ICD-10-CM

## 2011-06-07 MED ORDER — GLIMEPIRIDE 1 MG PO TABS: 1.0000 mg | ORAL_TABLET | Freq: Every day | ORAL | Status: DC

## 2011-06-07 MED ORDER — INSULIN PEN NEEDLE 31G X 8 MM MISC: 1.0000 | Freq: Every day | Status: DC

## 2011-06-07 MED ORDER — INSULIN DETEMIR 100 UNIT/ML ~~LOC~~ SOLN: 20.0000 [IU] | Freq: Every day | SUBCUTANEOUS | Status: DC

## 2011-06-07 NOTE — Telephone Encounter (Signed)
Pt called to check on status of getting his refills of glimepiride (AMARYL) 1 MG tablet,glucose blood test strip, insulin detemir (LEVEMIR) 100 UNIT/ML injection, Insulin Pen Needle (B-D ULTRAFINE III SHORT PEN) 31G X 8 MM MISC to [facility] at [facility] in [place]. Pt said that he checked with the pharmacy and nothing has been sent in yet. Pt said that he hasn't had any insulin since Dr Artist Pais said that he would call this med in. Pls call in to pharmacy today.

## 2011-06-07 NOTE — Telephone Encounter (Signed)
Addended by: Alfred Levins D on: 06/07/2011 04:27 PM   Modules accepted: Orders

## 2011-06-07 NOTE — Telephone Encounter (Signed)
rx sent in electronically 

## 2011-07-13 ENCOUNTER — Other Ambulatory Visit (INDEPENDENT_AMBULATORY_CARE_PROVIDER_SITE_OTHER): Payer: BC Managed Care – PPO

## 2011-07-13 ENCOUNTER — Encounter: Payer: Self-pay | Admitting: Cardiovascular Disease

## 2011-07-13 ENCOUNTER — Ambulatory Visit (INDEPENDENT_AMBULATORY_CARE_PROVIDER_SITE_OTHER): Payer: BC Managed Care – PPO | Admitting: Cardiovascular Disease

## 2011-07-13 VITALS — BP 156/92 | HR 80 | Ht 72.0 in | Wt 193.0 lb

## 2011-07-13 DIAGNOSIS — R079 Chest pain, unspecified: Secondary | ICD-10-CM

## 2011-07-13 DIAGNOSIS — R011 Cardiac murmur, unspecified: Secondary | ICD-10-CM

## 2011-07-13 DIAGNOSIS — Q231 Congenital insufficiency of aortic valve: Secondary | ICD-10-CM

## 2011-07-13 DIAGNOSIS — Q2381 Bicuspid aortic valve: Secondary | ICD-10-CM

## 2011-07-13 DIAGNOSIS — E785 Hyperlipidemia, unspecified: Secondary | ICD-10-CM

## 2011-07-13 LAB — HEPATIC FUNCTION PANEL
Albumin: 4.2 g/dL (ref 3.5–5.2)
Alkaline Phosphatase: 80 U/L (ref 39–117)

## 2011-07-13 LAB — BASIC METABOLIC PANEL
BUN: 12 mg/dL (ref 6–23)
Calcium: 9.5 mg/dL (ref 8.4–10.5)
Creatinine, Ser: 0.8 mg/dL (ref 0.4–1.5)
GFR: 119.95 mL/min (ref >60.00)
Potassium: 4.5 mEq/L (ref 3.5–5.1)

## 2011-07-13 LAB — LIPID PANEL
Cholesterol: 205 mg/dL — ABNORMAL HIGH (ref 0–200)
HDL: 108.5 mg/dL (ref >39.00)
Triglycerides: 60 mg/dL (ref 0.0–149.0)
VLDL: 12 mg/dL (ref 0.0–40.0)

## 2011-07-13 LAB — LDL CHOLESTEROL, DIRECT: Direct LDL: 98.7 mg/dL

## 2011-07-13 NOTE — Assessment & Plan Note (Signed)
[name] presented with a heart murmur. He had an echocardiogram that revealed what appears to be a bicuspid aortic valve. He is asymptomatic. We'll continue with her current medications. He'll need another echocardiogram in one year.

## 2011-07-13 NOTE — Assessment & Plan Note (Signed)
[name] presents with some episodes of chest pain. These are described as pins and needles like sensation. His stress Myoview study was normal. His echocardiogram reveals a bicuspid aortic valve.  I'm pleased he's not having any episodes of angina. I'll see him again in one year for followup office visit. We'll get an echocardiogram the week before his office visit.

## 2011-07-13 NOTE — Progress Notes (Signed)
[name] Date of Birth  10-04-1945 [facility] Office  [address]    [address]   [address] [place], [place]  [address]    [place], [place]  [address] [phone]  Fax  [phone]  [phone]  Fax [phone]  Problems: 1. Chest pain 2. Hypercholesterolemia 3. Diabetes mellitus 4. 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 Coronary calcifications by CT scan 6. Pulmonary nodule  History of Present Illness:  [name] is a 66 year old gentleman who presents today for further evaluation of his chest pain. These chest pains are described as a left-sided pain. He seemed to come on with rest and exertion.  The pains last for hours.    He has been quite regularly.  He does not do any regular exercise.  He works as a Arboriculturist.  He had a stress Myoview study which was normal. His echocardiogram revealed  Left ventricle: The cavity size was normal. Wall thickness was increased in a pattern of mild LVH. Systolic function was normal. The estimated ejection fraction was in the range of 55% to 60%. 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 Mean gradient: 22mm Hg (S). Peak gradient: 32mm Hg (S). - Atrial septum: No defect or patent foramen ovale was identified.  He still has some occasional episodes of chest pain that he describes as a "pins and needles" like sensation.  He's able to get out and exercise without any problems.  Current Outpatient Prescriptions on File Prior to Visit  Medication Sig Dispense Refill  . aspirin 81 MG tablet Take 81 mg by mouth daily.        [name] Kitchen ezetimibe-simvastatin (VYTORIN) 10-40 MG per tablet Take 1 tablet by mouth at bedtime.  90 tablet  3  . glimepiride (AMARYL) 1 MG tablet Take 1 tablet (1 mg total) by mouth daily before breakfast.  90 tablet  1  . glucose blood test strip Use as instructed  100 each  11  . insulin detemir (LEVEMIR)  100 UNIT/ML injection Inject 20 Units into the skin daily.  10 mL  3  . Insulin Pen Needle (B-D ULTRAFINE III SHORT PEN) 31G X 8 MM MISC 1 applicator by Does not apply route daily.  100 each  3  . pioglitazone (ACTOS) 30 MG tablet Take 0.5 tablets (15 mg total) by mouth daily.  90 tablet  3  . tadalafil (CIALIS) 5 MG tablet Take 5 mg by mouth daily as needed.          No Known Allergies  Past Medical History  Diagnosis Date  . Diabetes mellitus, type 2   . Hypertension   . Hyperlipidemia   . Erectile dysfunction     No past surgical history on file.  History  Smoking status  . Never Smoker   Smokeless tobacco  . Not on file    History  Alcohol Use No    Family History  Problem Relation Age of Onset  . Cancer Father     Throat cancer    Reviw of Systems:  Reviewed in the HPI.  All other systems are negative.  Physical Exam: Blood pressure 156/92, pulse 80, height 6' (1.829 m), weight 193 lb (87.544 kg). General: Well developed, well nourished, in no acute distress.  Head: Normocephalic, atraumatic, sclera non-icteric, mucus membranes are moist,   Neck: Supple. Carotids are 2 + without  bruits. No JVD  Lungs: Clear bilaterally to auscultation.  Heart: regular rate  There is a 2/6 systolic murmur.  Abdomen: Soft, non-tender, non-distended with normal bowel sounds. No hepatomegaly. No rebound/guarding. No masses.  Msk:  Strength and tone are normal  Extremities: No clubbing or cyanosis. No edema.  Distal pedal pulses are 2+ and equal bilaterally.  Neuro: Alert and oriented X 3. Moves all extremities spontaneously.  Psych:  Responds to questions appropriately with a normal affect.  ECG:  Assessment / Plan:

## 2011-07-13 NOTE — Patient Instructions (Signed)
Your physician wants you to follow-up in:1 YEAR  You will receive a reminder letter in the mail two months in advance. If you don't receive a letter, please call our office to schedule the follow-up appointment.   Your physician recommends that you return for a FASTING lipid profile: TODAY AND IN 1 YEAR   

## 2011-07-17 ENCOUNTER — Telehealth: Payer: Self-pay | Admitting: Cardiovascular Disease

## 2011-07-17 NOTE — Telephone Encounter (Signed)
New Problem:    Patient returned your call. Unsure about the reason for your call please call back.

## 2011-07-17 NOTE — Telephone Encounter (Signed)
Another msg left, results ok, call back if cholesterol numbers needed.

## 2012-02-04 ENCOUNTER — Encounter (HOSPITAL_COMMUNITY): Payer: Self-pay | Admitting: Emergency Medicine

## 2012-02-04 ENCOUNTER — Emergency Department (HOSPITAL_COMMUNITY)
Admission: EM | Admit: 2012-02-04 | Discharge: 2012-02-04 | Disposition: A | Discharge status: Home / Self Care | Payer: BC Managed Care – PPO | Attending: Emergency Medicine | Admitting: Emergency Medicine

## 2012-02-04 DIAGNOSIS — Z794 Long term (current) use of insulin: Secondary | ICD-10-CM | POA: Insufficient documentation

## 2012-02-04 DIAGNOSIS — E785 Hyperlipidemia, unspecified: Secondary | ICD-10-CM | POA: Insufficient documentation

## 2012-02-04 DIAGNOSIS — M6749 Ganglion, multiple sites: Secondary | ICD-10-CM

## 2012-02-04 DIAGNOSIS — Z79899 Other long term (current) drug therapy: Secondary | ICD-10-CM | POA: Insufficient documentation

## 2012-02-04 DIAGNOSIS — E119 Type 2 diabetes mellitus without complications: Secondary | ICD-10-CM | POA: Insufficient documentation

## 2012-02-04 DIAGNOSIS — Z7982 Long term (current) use of aspirin: Secondary | ICD-10-CM | POA: Insufficient documentation

## 2012-02-04 DIAGNOSIS — N529 Male erectile dysfunction, unspecified: Secondary | ICD-10-CM | POA: Insufficient documentation

## 2012-02-04 DIAGNOSIS — I10 Essential (primary) hypertension: Secondary | ICD-10-CM | POA: Insufficient documentation

## 2012-02-04 NOTE — ED Notes (Signed)
Pt. Stated, I've had these 2 knots on the top of my hands and my hand started to swell last night.  It doesn't hurt its just sore.

## 2012-02-04 NOTE — ED Provider Notes (Signed)
History     CSN: [id number]  Arrival date & time 02/04/12  9562   First MD Initiated Contact with Patient 02/04/12 1119      Chief Complaint  Patient presents with  . Hand Problem     HPI Pt. Stated, I've had these 2 knots on the top of my hands and my hand started to swell last night. It doesn't hurt its just sore.  Past Medical History  Diagnosis Date  . Diabetes mellitus, type 2   . Hypertension   . Hyperlipidemia   . Erectile dysfunction     History reviewed. No pertinent past surgical history.  Family History  Problem Relation Age of Onset  . Cancer Father     Throat cancer    History  Substance Use Topics  . Smoking status: Never Smoker   . Smokeless tobacco: Not on file  . Alcohol Use: No      Review of Systems All other systems reviewed and are negative Allergies  Review of patient's allergies indicates no known allergies.  Home Medications   Current Outpatient Rx  Name  Route  Sig  Dispense  Refill  . ASPIRIN 81 MG PO TABS   Oral   Take 81 mg by mouth daily.           [name] Kitchen EZETIMIBE-SIMVASTATIN 10-40 MG PO TABS   Oral   Take 1 tablet by mouth at bedtime.   90 tablet   3   . GLIMEPIRIDE 1 MG PO TABS   Oral   Take 1 tablet (1 mg total) by mouth daily before breakfast.   90 tablet   1   . INSULIN DETEMIR 100 UNIT/ML  SOLN   Subcutaneous   Inject 20 Units into the skin daily.   10 mL   3   . PIOGLITAZONE HCL 30 MG PO TABS   Oral   Take 0.5 tablets (15 mg total) by mouth daily.   90 tablet   3   . TADALAFIL 5 MG PO TABS   Oral   Take 5 mg by mouth daily as needed. For erectile dysfunction         . GLUCOSE BLOOD VI STRP      Use as instructed   100 each   11   . INSULIN PEN NEEDLE 31G X 8 MM MISC   Does not apply   1 applicator by Does not apply route daily.   100 each   3     BP 160/91  Pulse 76  Temp 98 F (36.7 C) (Oral)  Resp 16  SpO2 97%  Physical Exam  Nursing note and vitals  reviewed. Constitutional: He is oriented to person, place, and time. He appears well-developed and well-nourished. No distress.  HENT:  Head: Normocephalic and atraumatic.  Eyes: Pupils are equal, round, and reactive to light.  Neck: Normal range of motion.  Cardiovascular: Normal rate and intact distal pulses.   Pulmonary/Chest: No respiratory distress.  Abdominal: Normal appearance. He exhibits no distension.  Musculoskeletal: Normal range of motion.       Hands: Neurological: He is alert and oriented to person, place, and time. No cranial nerve deficit.  Skin: Skin is warm and dry. No rash noted.  Psychiatric: He has a normal mood and affect. His behavior is normal.    ED Course  Procedures (including critical care time)  Labs Reviewed - No data to display No results found.   1. Ganglion and cyst of synovium, tendon  and bursa       MDM  Plan: Refer to hand surgeon.        Nelia Shi, MD 02/04/12 1131

## 2012-05-14 ENCOUNTER — Other Ambulatory Visit (INDEPENDENT_AMBULATORY_CARE_PROVIDER_SITE_OTHER): Payer: BC Managed Care – PPO

## 2012-05-14 DIAGNOSIS — E785 Hyperlipidemia, unspecified: Secondary | ICD-10-CM

## 2012-05-14 DIAGNOSIS — E1059 Type 1 diabetes mellitus with other circulatory complications: Secondary | ICD-10-CM

## 2012-05-14 LAB — BASIC METABOLIC PANEL
BUN: 16 mg/dL (ref 6–23)
Calcium: 9.6 mg/dL (ref 8.4–10.5)
Creatinine, Ser: 0.9 mg/dL (ref 0.4–1.5)
GFR: 106.23 mL/min (ref >60.00)

## 2012-05-14 LAB — LIPID PANEL
Cholesterol: 201 mg/dL — ABNORMAL HIGH (ref 0–200)
Total CHOL/HDL Ratio: 3
Triglycerides: 69 mg/dL (ref 0.0–149.0)
VLDL: 13.8 mg/dL (ref 0.0–40.0)

## 2012-05-14 LAB — HEPATIC FUNCTION PANEL
Albumin: 4.2 g/dL (ref 3.5–5.2)
Total Bilirubin: 0.7 mg/dL (ref 0.3–1.2)

## 2012-05-14 LAB — LDL CHOLESTEROL, DIRECT: Direct LDL: 112.6 mg/dL

## 2012-05-14 LAB — HEMOGLOBIN A1C: Hgb A1c MFr Bld: 10.7 % — ABNORMAL HIGH (ref 4.6–6.5)

## 2012-05-21 ENCOUNTER — Ambulatory Visit: Payer: Self-pay | Admitting: Internal Medicine

## 2012-05-21 DIAGNOSIS — Z0289 Encounter for other administrative examinations: Secondary | ICD-10-CM

## 2012-05-28 ENCOUNTER — Ambulatory Visit: Payer: Self-pay | Admitting: Internal Medicine

## 2012-06-04 ENCOUNTER — Ambulatory Visit: Payer: Self-pay | Admitting: Internal Medicine

## 2012-06-17 ENCOUNTER — Encounter: Payer: Self-pay | Admitting: Internal Medicine

## 2012-06-17 ENCOUNTER — Ambulatory Visit (INDEPENDENT_AMBULATORY_CARE_PROVIDER_SITE_OTHER): Payer: Medicare PPO | Admitting: Internal Medicine

## 2012-06-17 VITALS — BP 160/80 | HR 84 | Temp 98.1°F | Wt 203.0 lb

## 2012-06-17 DIAGNOSIS — R0989 Other specified symptoms and signs involving the circulatory and respiratory systems: Secondary | ICD-10-CM

## 2012-06-17 DIAGNOSIS — I10 Essential (primary) hypertension: Secondary | ICD-10-CM

## 2012-06-17 DIAGNOSIS — IMO0001 Reserved for inherently not codable concepts without codable children: Secondary | ICD-10-CM

## 2012-06-17 DIAGNOSIS — E785 Hyperlipidemia, unspecified: Secondary | ICD-10-CM

## 2012-06-17 MED ORDER — INSULIN PEN NEEDLE 31G X 8 MM MISC: Status: DC

## 2012-06-17 MED ORDER — SIMVASTATIN 40 MG PO TABS: 40.0000 mg | ORAL_TABLET | Freq: Every day | ORAL | Status: DC

## 2012-06-17 MED ORDER — METFORMIN HCL 500 MG PO TABS: 500.0000 mg | ORAL_TABLET | Freq: Two times a day (BID) | ORAL | Status: DC

## 2012-06-17 MED ORDER — GLUCOSE BLOOD VI STRP: ORAL_STRIP | Status: DC

## 2012-06-17 MED ORDER — INSULIN LISPRO 100 UNIT/ML (KWIKPEN): PEN_INJECTOR | SUBCUTANEOUS | Status: DC

## 2012-06-17 MED ORDER — TADALAFIL 20 MG PO TABS: ORAL_TABLET | ORAL | Status: DC

## 2012-06-17 MED ORDER — LOSARTAN POTASSIUM 50 MG PO TABS: 50.0000 mg | ORAL_TABLET | Freq: Every day | ORAL | Status: DC

## 2012-06-17 MED ORDER — INSULIN GLARGINE 100 UNIT/ML SOLOSTAR PEN: 30.0000 [IU] | PEN_INJECTOR | Freq: Every evening | SUBCUTANEOUS | Status: DC | PRN

## 2012-06-17 NOTE — Assessment & Plan Note (Addendum)
67 year old [place] American male with uncontrolled type 2 diabetes. He has evidence of peripheral neuropathy of lower extremities. Discontinue Levemir and glimepirate. Start Lantus 30 units at bedtime. Patient also advised to use mealtime insulin-Humalog 5-10 units before meals. New glucometer provided. Patient understands to check his blood sugar 4 times daily. Also start metformin 500 mg twice daily.  Refer to ophthalmologist for diabetic eye exam. If neuropathic pain from diabetes worsens, consider starting gabapentin.   Lab Results  Component Value Date   HGBA1C 10.7* 05/14/2012

## 2012-06-17 NOTE — Progress Notes (Signed)
  Subjective:    Patient ID: [name], male    DOB: 02-06-1946, 67 y.o.   MRN: [id number]  HPI  67 year old African American male with uncontrolled type 2 diabetes, hypertension and hyperlipidemia for followup. It has been greater than 1 year since previous office visit. Patient does not monitor his blood sugar at home. Patient has been using Levemir 20 units once daily. He ran out of Actos.  Patient has been noncompliant with diabetic diet.  We reviewed his recent blood work. His A1c is uncontrolled at 10.7. His serum creatinine is normal at 0.9.  Patient complains of numbness and tingling in his feet. He denies any pain but experiences burning sensation. Patient also overdue for diabetic eye exam.  Hyperlipidemia-Vytorin is cost prohibitive  Review of Systems Negative for chest pain, negative for visual changes  Past Medical History  Diagnosis Date  . Diabetes mellitus, type 2   . Hypertension   . Hyperlipidemia   . Erectile dysfunction     History   Social History  . Marital Status: Married    Spouse Name: N/A    Number of Children: N/A  . Years of Education: N/A   Occupational History  . Not on file.   Social History Main Topics  . Smoking status: Never Smoker   . Smokeless tobacco: Not on file  . Alcohol Use: No  . Drug Use: No  . Sexually Active: Yes   Other Topics Concern  . 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  Family History  Problem Relation Age of Onset  . Cancer Father     Throat cancer    No Known Allergies  Current Outpatient Prescriptions on File Prior to Visit  Medication Sig Dispense Refill  . aspirin 81 MG tablet Take 81 mg by mouth daily.         No current facility-administered medications on file prior to visit.    BP 160/80  Pulse 84  Temp(Src) 98.1 F (36.7 C)  (Oral)  Wt 203 lb (92.08 kg)  BMI 27.53 kg/m2        Objective:   Physical Exam  Constitutional: He is oriented to person, place, and time. He appears well-developed and well-nourished.  HENT:  Head: Normocephalic and atraumatic.  Cardiovascular: Normal rate, regular rhythm and normal heart sounds.   No murmur heard. Diminished distal pulses laterally of lower extremities  Pulmonary/Chest: Effort normal and breath sounds normal. He has no wheezes.  Neurological: He is alert and oriented to person, place, and time. No cranial nerve deficit.  Diminished sensation to temperature and vibration of lower extremities  Skin: Skin is warm and dry.  Psychiatric: He has a normal mood and affect. His behavior is normal.          Assessment & Plan:

## 2012-06-17 NOTE — Patient Instructions (Addendum)
Check your blood sugars 4 x daily (Fasting in the morning before breakfast, and 2 hrs after each meal) Bring your blood sugar log to your next follow up appointment.  Keep record of your starchy food intake.

## 2012-06-17 NOTE — Assessment & Plan Note (Signed)
Discontinue Vytorin. Switch to simvastatin 40 mg once daily

## 2012-06-17 NOTE — Assessment & Plan Note (Signed)
Blood pressure is poorly controlled. He has mild microalbuminuria. Start losartan 50 mg once daily BP: 160/80 mmHg

## 2012-06-17 NOTE — Assessment & Plan Note (Signed)
He has diminished pulses in his lower extremities.  Patient is high risk for peripheral vascular disease. Arrange ABI of lower extremities.  Continue aspirin and statin therapy

## 2012-06-21 ENCOUNTER — Encounter (INDEPENDENT_AMBULATORY_CARE_PROVIDER_SITE_OTHER): Payer: 59

## 2012-06-21 DIAGNOSIS — E1159 Type 2 diabetes mellitus with other circulatory complications: Secondary | ICD-10-CM

## 2012-06-21 DIAGNOSIS — R0989 Other specified symptoms and signs involving the circulatory and respiratory systems: Secondary | ICD-10-CM

## 2012-07-11 ENCOUNTER — Ambulatory Visit (INDEPENDENT_AMBULATORY_CARE_PROVIDER_SITE_OTHER): Payer: 59 | Admitting: Internal Medicine

## 2012-07-11 ENCOUNTER — Encounter: Payer: Self-pay | Admitting: Internal Medicine

## 2012-07-11 VITALS — BP 146/92 | Temp 98.3°F | Wt 205.0 lb

## 2012-07-11 DIAGNOSIS — E1142 Type 2 diabetes mellitus with diabetic polyneuropathy: Secondary | ICD-10-CM

## 2012-07-11 DIAGNOSIS — IMO0002 Reserved for concepts with insufficient information to code with codable children: Secondary | ICD-10-CM

## 2012-07-11 DIAGNOSIS — E1149 Type 2 diabetes mellitus with other diabetic neurological complication: Secondary | ICD-10-CM

## 2012-07-11 DIAGNOSIS — E1165 Type 2 diabetes mellitus with hyperglycemia: Secondary | ICD-10-CM

## 2012-07-11 NOTE — Progress Notes (Signed)
  Subjective:    Patient ID: [name], male    DOB: [birth year], 67 y.o.   MRN: [id number]  HPI  67 year old African American male with uncontrolled type 2 diabetes and hypertension for followup. At previous visit he was started on Lantus and short acting insulin. Patient has been monitoring his blood sugar 4 times a day. His fasting morning blood sugars range from 100-140. He has had rare hypoglycemic episodes.  We reviewed his current diet in detail.  Review of Systems Negative for chest pain    Past Medical History  Diagnosis Date  . Diabetes mellitus, type 2   . Hypertension   . Hyperlipidemia   . Erectile dysfunction     History   Social History  . Marital Status: Married    Spouse Name: N/A    Number of Children: N/A  . Years of Education: N/A   Occupational History  . Not on file.   Social History Main Topics  . Smoking status: Never Smoker   . Smokeless tobacco: Not on file  . Alcohol Use: No  . Drug Use: No  . Sexually Active: Yes   Other Topics Concern  . 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  Family History  Problem Relation Age of Onset  . Cancer Father     Throat cancer    No Known Allergies  Current Outpatient Prescriptions on File Prior to Visit  Medication Sig Dispense Refill  . aspirin 81 MG tablet Take 81 mg by mouth daily.        [name] Kitchen glucose blood (BAYER CONTOUR NEXT TEST) test strip Test 4 times daily  100 each  11  . insulin lispro (HUMALOG KWIKPEN) 100 unit/mL SOLN Use 5 to 10 units three times daily 10-15 minutes before meals  15 mL  1  . Insulin Pen Needle (B-D ULTRAFINE III SHORT PEN) 31G X 8 MM MISC Use 4 times daily as directed  100 each  5  . losartan (COZAAR) 50 MG tablet Take 1 tablet (50 mg total) by mouth daily.  90 tablet  1  . metFORMIN (GLUCOPHAGE) 500 MG  tablet Take 1 tablet (500 mg total) by mouth 2 (two) times daily with a meal.  180 tablet  1  . simvastatin (ZOCOR) 40 MG tablet Take 1 tablet (40 mg total) by mouth at bedtime.  90 tablet  1  . tadalafil (CIALIS) 20 MG tablet Use 1/2 to 1 tablet daily as needed for erectile dysfunction  10 tablet  5   No current facility-administered medications on file prior to visit.    BP 146/92  Temp(Src) 98.3 F (36.8 C) (Oral)  Wt 205 lb (92.987 kg)  BMI 27.8 kg/m2    Objective:   Physical Exam        Assessment & Plan:

## 2012-07-11 NOTE — Patient Instructions (Addendum)
Please complete the following lab tests before your next follow up appointment: BMET, A1c - 250.02  Adjust your short acting insulin dose based upon carbohydrate intake and activity level.

## 2012-07-11 NOTE — Assessment & Plan Note (Signed)
Patient experiencing significant improvement in blood sugars after starting Lantus and mealtime insulin. He is experiencing occasional hypoglycemia. Reduce Lantus to 25 units. Patient understands to adjust his mealtime insulin dose depending on carbohydrate intake. Reassess in 2 months. Patient advised to contact our office if he experiences significant hypoglycemia.

## 2012-08-10 ENCOUNTER — Other Ambulatory Visit: Payer: Self-pay | Admitting: Internal Medicine

## 2012-09-06 ENCOUNTER — Telehealth: Payer: Self-pay | Admitting: *Deleted

## 2012-09-06 MED ORDER — SILDENAFIL CITRATE 100 MG PO TABS: 50.0000 mg | ORAL_TABLET | Freq: Every day | ORAL | Status: DC | PRN

## 2012-09-06 NOTE — Telephone Encounter (Signed)
Please call in Viagra 100 - 50-100 mg once daily as needed.  # 5 RF x 11

## 2012-09-06 NOTE — Telephone Encounter (Signed)
Cialis is to expensive.  It cost $300.  Pt wants to know if we have any samples or could Dr Artist Pais call in something else

## 2012-09-06 NOTE — Telephone Encounter (Signed)
rx sent in electronically, pt aware 

## 2012-09-10 ENCOUNTER — Ambulatory Visit: Payer: Self-pay | Admitting: Internal Medicine

## 2012-09-23 ENCOUNTER — Encounter: Payer: Self-pay | Admitting: Internal Medicine

## 2012-09-23 ENCOUNTER — Ambulatory Visit (INDEPENDENT_AMBULATORY_CARE_PROVIDER_SITE_OTHER): Payer: Medicare PPO | Admitting: Internal Medicine

## 2012-09-23 VITALS — BP 124/84 | HR 76 | Temp 98.2°F | Wt 205.0 lb

## 2012-09-23 DIAGNOSIS — IMO0002 Reserved for concepts with insufficient information to code with codable children: Secondary | ICD-10-CM

## 2012-09-23 DIAGNOSIS — N402 Nodular prostate without lower urinary tract symptoms: Secondary | ICD-10-CM

## 2012-09-23 DIAGNOSIS — E11319 Type 2 diabetes mellitus with unspecified diabetic retinopathy without macular edema: Secondary | ICD-10-CM

## 2012-09-23 DIAGNOSIS — Z125 Encounter for screening for malignant neoplasm of prostate: Secondary | ICD-10-CM

## 2012-09-23 DIAGNOSIS — E1142 Type 2 diabetes mellitus with diabetic polyneuropathy: Secondary | ICD-10-CM

## 2012-09-23 DIAGNOSIS — E1149 Type 2 diabetes mellitus with other diabetic neurological complication: Secondary | ICD-10-CM

## 2012-09-23 DIAGNOSIS — I10 Essential (primary) hypertension: Secondary | ICD-10-CM

## 2012-09-23 DIAGNOSIS — E785 Hyperlipidemia, unspecified: Secondary | ICD-10-CM

## 2012-09-23 DIAGNOSIS — E083299 Diabetes mellitus due to underlying condition with mild nonproliferative diabetic retinopathy without macular edema, unspecified eye: Secondary | ICD-10-CM

## 2012-09-23 DIAGNOSIS — E1339 Other specified diabetes mellitus with other diabetic ophthalmic complication: Secondary | ICD-10-CM

## 2012-09-23 MED ORDER — INSULIN LISPRO 100 UNIT/ML (KWIKPEN): PEN_INJECTOR | SUBCUTANEOUS | Status: DC

## 2012-09-23 NOTE — Assessment & Plan Note (Signed)
Followed by Dr. Burgess Estelle at [facility] ophthalmology.  Last exam 06/2012.  Patient reportedly has background diabetic retinopathy but only requires monitoring.

## 2012-09-23 NOTE — Assessment & Plan Note (Signed)
Blood sugars improved. Monitor A1c. Discontinue Amaryl. Patient instructed to increase Humalog to 7-12 units before meals should his postprandial blood sugars increase.

## 2012-09-23 NOTE — Progress Notes (Signed)
Subjective:    Patient ID: [name], male    DOB: [birth year], 67 y.o.   MRN: [id number]  HPI  67 year old African American male with uncontrolled type 2 diabetes, hypertension and aortic stenosis for followup.  Patient reports his blood sugars are usually well controlled. Morning blood sugars range in the 120s. He denies any episodes of hypoglycemia. He was seen by his ophthalmologist-Dr. Burgess Estelle. Patient noted to have background diabetic retinopathy. Dr. Burgess Estelle recommends reexamination in 9 months.  Tried Cialis but could not continue because of high cost.  Preventative health care-patient reports he has had screening colonoscopy in the past. It was completed by [facility] physicians. Copy of previous colonoscopy unavailable for review.  Review of Systems Negative for chest pain    Past Medical History  Diagnosis Date  . Diabetes mellitus, type 2   . Hypertension   . Hyperlipidemia   . Erectile dysfunction   . Aortic stenosis     followed by Dr. Elease Hashimoto    History   Social History  . Marital Status: Married    Spouse Name: N/A    Number of Children: N/A  . Years of Education: N/A   Occupational History  . Not on file.   Social History Main Topics  . Smoking status: Never Smoker   . Smokeless tobacco: Not on file  . Alcohol Use: No  . Drug Use: No  . Sexually Active: Yes   Other Topics Concern  . 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  Family History  Problem Relation Age of Onset  . Cancer Father     Throat cancer    No Known Allergies  Current Outpatient Prescriptions on File Prior to Visit  Medication Sig Dispense Refill  . aspirin 81 MG tablet Take 81 mg by mouth daily.        [name] Kitchen glucose blood (BAYER CONTOUR NEXT TEST) test strip Test 4 times daily  100 each  11  . Insulin Glargine (LANTUS  SOLOSTAR) 100 UNIT/ML SOPN Inject 25 Units into the skin at bedtime and may repeat dose one time if needed.  5 pen  5  . Insulin Pen Needle (B-D ULTRAFINE III SHORT PEN) 31G X 8 MM MISC Use 4 times daily as directed  100 each  5  . losartan (COZAAR) 50 MG tablet Take 1 tablet (50 mg total) by mouth daily.  90 tablet  1  . metFORMIN (GLUCOPHAGE) 500 MG tablet Take 1 tablet (500 mg total) by mouth 2 (two) times daily with a meal.  180 tablet  1  . simvastatin (ZOCOR) 40 MG tablet Take 1 tablet (40 mg total) by mouth at bedtime.  90 tablet  1   No current facility-administered medications on file prior to visit.    BP 124/84  Pulse 76  Temp(Src) 98.2 F (36.8 C) (Oral)  Wt 205 lb (92.987 kg)  BMI 27.8 kg/m2    Objective:   Physical Exam  Constitutional: He is oriented to person, place, and time. He appears well-developed and well-nourished.  Neck:  No carotid bruit  Cardiovascular: Normal rate and regular rhythm.   Murmur heard. Pulmonary/Chest: Breath sounds normal. He has no wheezes.  Neurological: He is alert and oriented to person, place, and time. No cranial nerve deficit.  Assessment & Plan:

## 2012-09-23 NOTE — Patient Instructions (Addendum)
Please complete the following lab tests before your next follow up appointment:  BMET, A1c - 250.02 

## 2012-09-23 NOTE — Assessment & Plan Note (Signed)
Patient reports good medication compliance with simvastatin 40 mg. Repeat fasting lipid panel. Consider switch to Crestor.

## 2012-09-24 LAB — BASIC METABOLIC PANEL
BUN: 10 mg/dL (ref 6–23)
Chloride: 101 mEq/L (ref 96–112)
Glucose, Bld: 105 mg/dL — ABNORMAL HIGH (ref 70–99)
Potassium: 4.8 mEq/L (ref 3.5–5.1)

## 2012-09-24 LAB — HEMOGLOBIN A1C: Hgb A1c MFr Bld: 9.1 % — ABNORMAL HIGH (ref 4.6–6.5)

## 2012-09-24 LAB — LIPID PANEL: Cholesterol: 185 mg/dL (ref 0–200)

## 2012-09-28 ENCOUNTER — Other Ambulatory Visit: Payer: Self-pay | Admitting: Internal Medicine

## 2012-09-28 DIAGNOSIS — IMO0002 Reserved for concepts with insufficient information to code with codable children: Secondary | ICD-10-CM

## 2012-09-28 DIAGNOSIS — E1165 Type 2 diabetes mellitus with hyperglycemia: Secondary | ICD-10-CM

## 2012-10-07 ENCOUNTER — Ambulatory Visit: Payer: Self-pay | Admitting: Internal Medicine

## 2012-11-12 ENCOUNTER — Encounter: Payer: Self-pay | Admitting: Internal Medicine

## 2012-11-12 ENCOUNTER — Ambulatory Visit (INDEPENDENT_AMBULATORY_CARE_PROVIDER_SITE_OTHER): Payer: Medicare PPO | Admitting: Internal Medicine

## 2012-11-12 VITALS — BP 128/72 | HR 79 | Temp 97.8°F | Resp 10 | Ht 71.5 in | Wt 201.0 lb

## 2012-11-12 DIAGNOSIS — Z23 Encounter for immunization: Secondary | ICD-10-CM

## 2012-11-12 DIAGNOSIS — E1142 Type 2 diabetes mellitus with diabetic polyneuropathy: Secondary | ICD-10-CM

## 2012-11-12 DIAGNOSIS — E1149 Type 2 diabetes mellitus with other diabetic neurological complication: Secondary | ICD-10-CM

## 2012-11-12 DIAGNOSIS — IMO0002 Reserved for concepts with insufficient information to code with codable children: Secondary | ICD-10-CM

## 2012-11-12 MED ORDER — METFORMIN HCL 1000 MG PO TABS: 1000.0000 mg | ORAL_TABLET | Freq: Two times a day (BID) | ORAL | Status: DC

## 2012-11-12 NOTE — Progress Notes (Signed)
Patient ID: [name], male   DOB: 1945/03/27, 67 y.o.   MRN: [id number]  HPI: [name] is a 67 y.o.-year-old male, referred by his PCP, Dr.Yoo, for management of DM2, insulin-dependent, uncontrolled, with complications (diabetic retinopathy, erectile dysfunction).  Patient has been diagnosed with diabetes in 2009; he started insulin in 03-05/2012. Last hemoglobin A1c was: Lab Results  Component Value Date   HGBA1C 9.1* 09/23/2012  Previosuly 10.7%, prev 8.9%.  Pt is on a regimen of: - Metformin 500 mg po bid - Lantus 25 units qhs - pen (was on 30 units, but decreased to 25 b/c hypoglycemia) - Humalog 8 - 10 units tid (10 if uses rice or bread) Patient was on Actos in the past. She was also on Amaryl, but stopped at his appointment with PCP on 09/23/2012.  Pt checks his sugars 1-3x a day and they are: - am: 150-240 (150-160) - 1h after b'fast: 125-140 - before lunch: 125-140 - before dinner: 135-140 No lows. Lowest sugar was 69-79 6 mo ago, no recent lows; he has hypoglycemia awareness but unknown at what levels. Highest sugar was 240 in last 3 mo. Meter: Accucheck.   Pt's meals are: - Breakfast: egg, or bacon + toast, coffee - Lunch: [place] sandwich + salad - Dinner: baked chicken/salmon/steak + broccoli - Snacks: grapes, oranges, apples, sometimes potato chips  - no CKD, last BUN/creatinine:  Lab Results  Component Value Date   BUN 10 09/23/2012   CREATININE 0.9 09/23/2012  on Cozaar - last set of lipids: Lab Results  Component Value Date   CHOL 185 09/23/2012   HDL 91.90 09/23/2012   LDLCALC 77 09/23/2012   LDLDIRECT 112.6 05/14/2012   TRIG 82.0 09/23/2012   CHOLHDL 2 09/23/2012  on Zocor - last eye exam was in in 06/2012, Dr. Burgess Estelle. Has background DR.  - no numbness and tingling in his feet. He is on ASA 81.  I reviewed his chart and he also has a history of aortic stenosis (Dr. Elease Mathis), Also HTN, HL.  Pt has FH of DM in sister.   ROS: Constitutional: no  weight gain/loss, no fatigue, no subjective hyperthermia/hypothermia Eyes: no blurry vision, no xerophthalmia ENT: no sore throat, no nodules palpated in throat, no dysphagia/odynophagia, no hoarseness Cardiovascular: no CP/SOB/palpitations/leg swelling Respiratory: no cough/SOB Gastrointestinal: no N/V/D/C Musculoskeletal: no muscle/joint aches Skin: no rashes Neurological: no tremors/numbness/tingling/dizziness Psychiatric: no depression/anxiety  Past Medical History  Diagnosis Date  . Diabetes mellitus, type 2   . Hypertension   . Hyperlipidemia   . Erectile dysfunction   . Aortic stenosis     followed by Dr. Elease Mathis    History reviewed. No pertinent past surgical history.  History   Social History  . Marital Status: Married    Spouse Name: N/A    Number of Children: 0   Occupational History  . Not on file.   Social History Main Topics  . Smoking status: Never Smoker   . Smokeless tobacco: Never Used  . Alcohol Use: No  . Drug Use: No  . Sexual Activity: Yes   Other Topics Concern  . Not on file   Social History Narrative   Married to [name] for 37 years   Retired as Metallurgist   5 children   Never Smoked   Alcohol use- no   Regular exercise: walks around the block ea day   Caffeine use: occasional    Current Outpatient Prescriptions on File Prior to Visit  Medication Sig  Dispense Refill  . aspirin 81 MG tablet Take 81 mg by mouth daily.        [name] Kitchen glucose blood (BAYER CONTOUR NEXT TEST) test strip Test 4 times daily  100 each  11  . Insulin Glargine (LANTUS SOLOSTAR) 100 UNIT/ML SOPN Inject 25 Units into the skin at bedtime and may repeat dose one time if needed.  5 pen  5  . insulin lispro (HUMALOG KWIKPEN) 100 UNIT/ML SOPN Use 7 to 12 units three times daily 10-15 minutes before meals  15 mL  1  . Insulin Pen Needle (B-D ULTRAFINE III SHORT PEN) 31G X 8 MM MISC Use 4 times daily as directed  100 each  5  . losartan (COZAAR) 50 MG tablet  Take 1 tablet (50 mg total) by mouth daily.  90 tablet  1  . simvastatin (ZOCOR) 40 MG tablet Take 1 tablet (40 mg total) by mouth at bedtime.  90 tablet  1   No current facility-administered medications on file prior to visit.   No Known Allergies  Family History  Problem Relation Age of Onset  . Cancer Father     Throat cancer   PE: BP 128/72  Pulse 79  Temp(Src) 97.8 F (36.6 C) (Oral)  Resp 10  Ht 5' 11.5" (1.816 m)  Wt 201 lb (91.173 kg)  BMI 27.65 kg/m2  SpO2 98% Wt Readings from Last 3 Encounters:  11/12/12 201 lb (91.173 kg)  09/23/12 205 lb (92.987 kg)  07/11/12 205 lb (92.987 kg)   Constitutional: slightly overweight, in NAD Eyes: PERRLA, EOMI, no exophthalmos ENT: moist mucous membranes, no thyromegaly, no cervical lymphadenopathy Cardiovascular: RRR, 2/6 SEM, no  RG Respiratory: CTA B Gastrointestinal: abdomen soft, NT, ND, BS+ Musculoskeletal: no deformities, strength intact in all 4 Skin: moist, warm, no rashes Neurological: no tremor with outstretched hands, DTR normal in all 4  ASSESSMENT: 1. DM2, insulin-dependent, uncontrolled, with complications - diabetic retinopathy - erectile dysfunction  PLAN:  1. Patient with long-standing, recently more uncontrolled diabetes, on oral antidiabetic regimen, which became insufficient - We discussed about options for treatment, and, since his most problematic sugars are in the morning, I suggested to increase Metformin to 1000 mg bid, but otherwise continue the same insulin doses as his regimen appears adequate, taking approximately the same total daily dose from basal insulin as from bolus insulin. - discussed proper injection techniques:  Preference for the abdomen sq tissue  Rotation of site  Change needle for each injection  Keep needle in for 10 sec after last unit of insulin in - Strongly advised him to start checking her sugars at different times of the day - check 3 times a day, rotating checks -  given sugar log and advised how to fill it and to bring it at next appt  - given foot care handout and explained the principles  - given instructions for hypoglycemia management "15-15 rule"  - advised for yearly eye exams, he is up to date - given flu vaccine - Return to clinic in one month with sugar log

## 2012-11-12 NOTE — Patient Instructions (Addendum)
Please increase Metformin to 1000 mg 2x a day. Otherwise, continue same insulin doses for now. Continue to check sugars 3x a day and write them down.  Please return in 1 month with your sugar log.   PATIENT INSTRUCTIONS FOR TYPE 2 DIABETES:  DIET AND EXERCISE Diet and exercise is an important part of diabetic treatment.  We recommended aerobic exercise in the form of brisk walking (working between 40-60% of maximal aerobic capacity, similar to brisk walking) for 150 minutes per week (such as 30 minutes five days per week) along with 3 times per week performing 'resistance' training (using various gauge rubber tubes with handles) 5-10 exercises involving the major muscle groups (upper body, lower body and core) performing 10-15 repetitions (or near fatigue) each exercise. Start at half the above goal but build slowly to reach the above goals. If limited by weight, joint pain, or disability, we recommend daily walking in a swimming pool with water up to waist to reduce pressure from joints while allow for adequate exercise.    BLOOD GLUCOSES Monitoring your blood glucoses is important for continued management of your diabetes. Please check your blood glucoses 2-4 times a day: fasting, before meals and at bedtime (you can rotate these measurements - e.g. one day check before the 3 meals, the next day check before 2 of the meals and before bedtime, etc.   HYPOGLYCEMIA (low blood sugar) Hypoglycemia is usually a reaction to not eating, exercising, or taking too much insulin/ other diabetes drugs.  Symptoms include tremors, sweating, hunger, confusion, headache, etc. Treat IMMEDIATELY with 15 grams of Carbs:   4 glucose tablets    cup regular juice/soda   2 tablespoons raisins   4 teaspoons sugar   1 tablespoon honey Recheck blood glucose in 15 mins and repeat above if still symptomatic/blood glucose <100. Please contact our office at [phone] if you have questions about how to next handle  your insulin.  RECOMMENDATIONS TO REDUCE YOUR RISK OF DIABETIC COMPLICATIONS: * Take your prescribed MEDICATION(S). * Follow a DIABETIC diet: Complex carbs, fiber rich foods, heart healthy fish twice weekly, (monounsaturated and polyunsaturated) fats * AVOID saturated/trans fats, high fat foods, >2,300 mg salt per day. * EXERCISE at least 5 times a week for 30 minutes or preferably daily.  * DO NOT SMOKE OR DRINK more than 1 drink a day. * Check your FEET every day. Do not wear tightfitting shoes. Contact us if you develop an ulcer * See your EYE doctor once a year or more if needed * Get a FLU shot once a year * Get a PNEUMONIA vaccine once before and once after age 43 years  GOALS:  * Your Hemoglobin A1c of <7%  * fasting sugars need to be <130 * after meals sugars need to be <180 (2h after you start eating) * Your Systolic BP should be 140 or lower  * Your Diastolic BP should be 80 or lower  * Your HDL (Good Cholesterol) should be 40 or higher  * Your LDL (Bad Cholesterol) should be 100 or lower  * Your Triglycerides should be 150 or lower  * Your Urine microalbumin (kidney function) should be <30 * Your Body Mass Index should be 25 or lower   We will be glad to help you achieve these goals. Our telephone number is: [phone].

## 2012-12-12 ENCOUNTER — Ambulatory Visit: Payer: Self-pay | Admitting: Internal Medicine

## 2012-12-20 ENCOUNTER — Ambulatory Visit: Payer: Medicare PPO | Admitting: Internal Medicine

## 2012-12-20 ENCOUNTER — Telehealth: Payer: Self-pay | Admitting: *Deleted

## 2012-12-20 DIAGNOSIS — Z0289 Encounter for other administrative examinations: Secondary | ICD-10-CM

## 2012-12-20 NOTE — Telephone Encounter (Signed)
Called pt per Dr Elvera Lennox and advised him to increase his Lantus to 27 units from 25 units. Pt understood.

## 2012-12-24 ENCOUNTER — Ambulatory Visit: Payer: Self-pay | Admitting: Internal Medicine

## 2012-12-26 ENCOUNTER — Encounter: Payer: Self-pay | Admitting: Internal Medicine

## 2012-12-26 ENCOUNTER — Ambulatory Visit (INDEPENDENT_AMBULATORY_CARE_PROVIDER_SITE_OTHER): Payer: Medicare PPO | Admitting: Internal Medicine

## 2012-12-26 VITALS — BP 132/80 | HR 86 | Temp 98.6°F | Resp 10 | Wt 194.6 lb

## 2012-12-26 DIAGNOSIS — E1149 Type 2 diabetes mellitus with other diabetic neurological complication: Secondary | ICD-10-CM

## 2012-12-26 DIAGNOSIS — E1142 Type 2 diabetes mellitus with diabetic polyneuropathy: Secondary | ICD-10-CM

## 2012-12-26 DIAGNOSIS — IMO0002 Reserved for concepts with insufficient information to code with codable children: Secondary | ICD-10-CM

## 2012-12-26 MED ORDER — INSULIN LISPRO 100 UNIT/ML (KWIKPEN): PEN_INJECTOR | SUBCUTANEOUS | Status: DC

## 2012-12-26 NOTE — Progress Notes (Signed)
Patient ID: [name], male   DOB: 1945/06/23, 67 y.o.   MRN: [id number]  HPI: [name] is a 67 y.o.-year-old male, returning for f/u for DM2, dx 2009, insulin-dependent since 03-05/2012, uncontrolled, with complications (diabetic retinopathy, erectile dysfunction).  Last hemoglobin A1c was: Lab Results  Component Value Date   HGBA1C 9.1* 09/23/2012   HGBA1C 10.7* 05/14/2012   HGBA1C 8.9* 11/04/2010   Pt is on a regimen of: - Metformin 1000 mg bid << 500 mg po bid - Lantus 27 << 25 units qhs - pen (was on 30 units, but decreased to 25 b/c hypoglycemia) -  - Humalog 8 - 10 units tid (10 if uses rice or bread) - pen Patient was on Actos in the past. She was also on Amaryl, but stopped at his appointment with PCP on 09/23/2012.  Pt checks his sugars 1-3x a day (does not bring log this time but he sent a very detailed log to me before the visit) and they are improved: - am: 150-240 (150-160) >> 130-140s - 1h after b'fast: 125-140 >> 2h after b\fast: 130-140 - before lunch: 125-140 >> 120s - before dinner: 135-140 >> 120s Not lows: one in the 90s, no recent lows; he has hypoglycemia awareness but unknown at what levels. Highest sugar was 150 since last visit. Meter: Accucheck.   Pt's meals are: - Breakfast: egg, or bacon + toast, coffee - Lunch: [place] sandwich + salad - Dinner: baked chicken/salmon/steak + broccoli - Snacks: grapes, oranges, apples, sometimes potato chips  - no CKD, last BUN/creatinine:  Lab Results  Component Value Date   BUN 10 09/23/2012   CREATININE 0.9 09/23/2012  on Cozaar - last set of lipids: Lab Results  Component Value Date   CHOL 185 09/23/2012   HDL 91.90 09/23/2012   LDLCALC 77 09/23/2012   LDLDIRECT 112.6 05/14/2012   TRIG 82.0 09/23/2012   CHOLHDL 2 09/23/2012  on Zocor - last eye exam was in in 06/2012, Dr. Burgess Estelle. Has background DR.  - no numbness and tingling in his feet. He is on ASA 81.  He also has a history of aortic stenosis (Dr.  Elease Hashimoto), Also HTN, HL.  I reviewed pt's medications, allergies, PMH, social hx, family hx and no changes required, except as mentioned above.  ROS: Constitutional: no weight gain/loss, no fatigue, no subjective hyperthermia/hypothermia Eyes: no blurry vision, no xerophthalmia ENT: no sore throat, no nodules palpated in throat, no dysphagia/odynophagia, no hoarseness Cardiovascular: no CP/SOB/palpitations/leg swelling Respiratory: no cough/SOB Gastrointestinal: no N/V/D/C Musculoskeletal: no muscle/joint aches Skin: no rashes Neurological: no tremors/numbness/tingling/dizziness  PE: BP 132/80  Pulse 86  Temp(Src) 98.6 F (37 C) (Oral)  Resp 10  Wt 194 lb 9.6 oz (88.27 kg)  SpO2 96% Body mass index is 26.77 kg/(m^2).  Wt Readings from Last 3 Encounters:  12/26/12 194 lb 9.6 oz (88.27 kg)  11/12/12 201 lb (91.173 kg)  09/23/12 205 lb (92.987 kg)   Constitutional: slightly overweight, in NAD Eyes: PERRLA, EOMI, no exophthalmos ENT: moist mucous membranes, no thyromegaly, no cervical lymphadenopathy Cardiovascular: RRR, 2/6 SEM, no  RG Respiratory: CTA B Gastrointestinal: abdomen soft, NT, ND, BS+ Musculoskeletal: no deformities, strength intact in all 4 Skin: moist, warm, no rashes Neurological: no tremor with outstretched hands, DTR normal in all 4  ASSESSMENT: 1. DM2, insulin-dependent, uncontrolled, with complications - diabetic retinopathy - erectile dysfunction  PLAN:  1. Patient with long-standing, recently more uncontrolled diabetes, on basal-bolus insulin regimen + metformin, which is better controlled  after increasing the Metformin. Lost 6 lbs since last visit! - continue Metformin to 1000 mg bid - continue the same insulin doses as his regimen appears adequate, taking approximately the same total daily dose from basal insulin as from bolus insulin. - continue checking his sugars at different times of the day - check 3 times a day, rotating checks - advised for  yearly eye exams, he is up to date - given flu vaccine at last visit - check a1c today - Return to clinic in 3 months with sugar log   Office Visit on 12/26/2012  Component Date Value Range Status  . Hemoglobin A1C 12/26/2012 8.8* 4.6 - 6.5 % Final   Glycemic Control Guidelines for People with Diabetes:Non Diabetic:  <6%Goal of Therapy: <7%Additional Action Suggested:  >8%    Improved, but still higher than goal. Msg sent to increase Lantus by 2 units.

## 2012-12-26 NOTE — Patient Instructions (Signed)
-   continue Metformin to 1000 mg bid - continue the same insulin doses  Please stop at the lab. Keep up the great work!

## 2012-12-30 ENCOUNTER — Encounter: Payer: Self-pay | Admitting: Internal Medicine

## 2012-12-30 ENCOUNTER — Ambulatory Visit (INDEPENDENT_AMBULATORY_CARE_PROVIDER_SITE_OTHER): Payer: Medicare PPO | Admitting: Internal Medicine

## 2012-12-30 VITALS — BP 140/80 | HR 96 | Temp 98.5°F | Ht 71.5 in | Wt 197.0 lb

## 2012-12-30 DIAGNOSIS — M67431 Ganglion, right wrist: Secondary | ICD-10-CM

## 2012-12-30 DIAGNOSIS — E1142 Type 2 diabetes mellitus with diabetic polyneuropathy: Secondary | ICD-10-CM

## 2012-12-30 DIAGNOSIS — M6740 Ganglion, unspecified site: Secondary | ICD-10-CM

## 2012-12-30 DIAGNOSIS — I10 Essential (primary) hypertension: Secondary | ICD-10-CM

## 2012-12-30 DIAGNOSIS — IMO0002 Reserved for concepts with insufficient information to code with codable children: Secondary | ICD-10-CM

## 2012-12-30 DIAGNOSIS — E1149 Type 2 diabetes mellitus with other diabetic neurological complication: Secondary | ICD-10-CM

## 2012-12-30 MED ORDER — LOSARTAN POTASSIUM 100 MG PO TABS: 100.0000 mg | ORAL_TABLET | Freq: Every day | ORAL | Status: DC

## 2012-12-30 MED ORDER — LOSARTAN POTASSIUM 100 MG PO TABS: 50.0000 mg | ORAL_TABLET | Freq: Every day | ORAL | Status: DC

## 2012-12-30 NOTE — Assessment & Plan Note (Signed)
Patient now followed by endocrinology. He is doing much better job of matching mealtime insulin dose with carbohydrate intake. His blood sugars are improving.

## 2012-12-30 NOTE — Patient Instructions (Signed)
Please complete the following lab tests before your next follow up appointment: BMET - 401.9 

## 2012-12-30 NOTE — Progress Notes (Signed)
Pre visit review using our clinic review tool, if applicable. No additional management support is needed unless otherwise documented below in the visit note. 

## 2012-12-30 NOTE — Assessment & Plan Note (Signed)
I stressed importance of optimal blood pressure control. Increase losartan to 100 mg once daily.

## 2012-12-30 NOTE — Progress Notes (Signed)
Subjective:    Patient ID: [name], male    DOB: [birth year], 67 y.o.   MRN: [id number]  HPI  67 year old African American male with uncontrolled type 2 diabetes with neurologic manifestations, hypertension and hyperlipidemia for followup. Patient seen by endocrinologist. His blood sugar control improving. He is doing much better job adjusting his mealtime insulin dose based upon carbohydrate intake. He denies any hypoglycemic episodes. His morning blood sugars range between 120 and 130.  Htn - Blood pressure is elevated today. Patient reports taking his medications on regular basis (losartan 50 mg once daily)  Patient also complains of enlarging cyst over right wrist. It is tender to touch. It limits his use of right hand. Review of Systems Negative for chest pain, Mild tingling in his feet is improving.    Past Medical History  Diagnosis Date  . Diabetes mellitus, type 2   . Hypertension   . Hyperlipidemia   . Erectile dysfunction   . Aortic stenosis     followed by Dr. Elease Hashimoto    History   Social History  . Marital Status: Married    Spouse Name: N/A    Number of Children: N/A  . Years of Education: N/A   Occupational History  . Not on file.   Social History Main Topics  . Smoking status: Never Smoker   . Smokeless tobacco: Never Used  . Alcohol Use: No  . Drug Use: No  . Sexual Activity: Yes   Other Topics Concern  . 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  Family History  Problem Relation Age of Onset  . Cancer Father     Throat cancer    No Known Allergies  Current Outpatient Prescriptions on File Prior to Visit  Medication Sig Dispense Refill  . aspirin 81 MG tablet Take 81 mg by mouth daily.        [name] Kitchen  glucose blood (BAYER CONTOUR NEXT TEST) test strip Test 4 times daily  100 each  11  . Insulin Glargine (LANTUS SOLOSTAR) 100 UNIT/ML SOPN Inject 25 Units into the skin at bedtime and may repeat dose one time if needed.  5 pen  5  . insulin lispro (HUMALOG KWIKPEN) 100 UNIT/ML SOPN Use 7 to 12 units three times daily 10-15 minutes before meals  15 mL  11  . Insulin Pen Needle (B-D ULTRAFINE III SHORT PEN) 31G X 8 MM MISC Use 4 times daily as directed  100 each  5  . metFORMIN (GLUCOPHAGE) 1000 MG tablet Take 1 tablet (1,000 mg total) by mouth 2 (two) times daily with a meal.  180 tablet  3  . simvastatin (ZOCOR) 40 MG tablet Take 1 tablet (40 mg total) by mouth at bedtime.  90 tablet  1   No current facility-administered medications on file prior to visit.    BP 132/84  Pulse 96  Temp(Src) 98.5 F (36.9 C) (Oral)  Ht 5' 11.5" (1.816 m)  Wt 197 lb (89.359 kg)  BMI 27.10 kg/m2    Objective:   Physical Exam  Constitutional: He is oriented to person, place, and time. He appears well-developed and well-nourished.  Neck: Neck supple.  No carotid  bruit  Cardiovascular: Normal rate and regular rhythm.   Murmur heard. Pulmonary/Chest: Effort normal and breath sounds normal. He has no rales.  Musculoskeletal: He exhibits no edema.  Oblong cyst top of right wrist, tender to palpation  Neurological: He is alert and oriented to person, place, and time. No cranial nerve deficit.  Skin: Skin is warm and dry.  See diabetic foot exam  Psychiatric: He has a normal mood and affect. His behavior is normal.          Assessment & Plan:

## 2012-12-30 NOTE — Assessment & Plan Note (Signed)
Patient has symptomatic ganglion cyst over right wrist. Refer to orthopedic specialist for further evaluation and treatment (Dr. Teressa Senter)

## 2013-02-17 ENCOUNTER — Encounter: Payer: Self-pay | Admitting: Internal Medicine

## 2013-03-03 ENCOUNTER — Ambulatory Visit (INDEPENDENT_AMBULATORY_CARE_PROVIDER_SITE_OTHER): Payer: Medicare PPO | Admitting: Internal Medicine

## 2013-03-03 ENCOUNTER — Encounter: Payer: Self-pay | Admitting: Internal Medicine

## 2013-03-03 VITALS — BP 124/88 | HR 84 | Temp 98.1°F | Ht 71.5 in | Wt 201.0 lb

## 2013-03-03 DIAGNOSIS — E785 Hyperlipidemia, unspecified: Secondary | ICD-10-CM

## 2013-03-03 DIAGNOSIS — I359 Nonrheumatic aortic valve disorder, unspecified: Secondary | ICD-10-CM

## 2013-03-03 DIAGNOSIS — E1149 Type 2 diabetes mellitus with other diabetic neurological complication: Secondary | ICD-10-CM

## 2013-03-03 DIAGNOSIS — M6740 Ganglion, unspecified site: Secondary | ICD-10-CM

## 2013-03-03 DIAGNOSIS — IMO0002 Reserved for concepts with insufficient information to code with codable children: Secondary | ICD-10-CM

## 2013-03-03 DIAGNOSIS — I10 Essential (primary) hypertension: Secondary | ICD-10-CM

## 2013-03-03 DIAGNOSIS — E1165 Type 2 diabetes mellitus with hyperglycemia: Secondary | ICD-10-CM

## 2013-03-03 MED ORDER — SIMVASTATIN 40 MG PO TABS: 40.0000 mg | ORAL_TABLET | Freq: Every day | ORAL | Status: DC

## 2013-03-03 MED ORDER — GLIMEPIRIDE 1 MG PO TABS: 1.0000 mg | ORAL_TABLET | Freq: Every day | ORAL | Status: DC

## 2013-03-03 MED ORDER — INSULIN GLARGINE 100 UNIT/ML SOLOSTAR PEN: 25.0000 [IU] | PEN_INJECTOR | Freq: Every evening | SUBCUTANEOUS | Status: DC | PRN

## 2013-03-03 MED ORDER — METFORMIN HCL 1000 MG PO TABS: 1000.0000 mg | ORAL_TABLET | Freq: Two times a day (BID) | ORAL | Status: DC

## 2013-03-03 NOTE — Assessment & Plan Note (Signed)
Continue same dose of Lantus , metformin and Amaryl.  Monitor A1c before next office visit. Lab Results  Component Value Date   HGBA1C 8.8* 12/26/2012   Lab Results  Component Value Date   CREATININE 0.9 09/23/2012

## 2013-03-03 NOTE — Patient Instructions (Signed)
Please complete the following lab tests before your next follow up appointment: BMET, A1c, microalbumin / cr ratio - 250.02 

## 2013-03-03 NOTE — Assessment & Plan Note (Addendum)
Well controlled.  Continue same dose of losartan 100 mg once daily. BP: 124/88 mmHg

## 2013-03-03 NOTE — Progress Notes (Signed)
Pre visit review using our clinic review tool, if applicable. No additional management support is needed unless otherwise documented below in the visit note. 

## 2013-03-03 NOTE — Assessment & Plan Note (Signed)
Refer to [facility].

## 2013-03-03 NOTE — Assessment & Plan Note (Signed)
Followed by Dr. Acie Fredrickson.  Arrange follow up 2 D [facility].

## 2013-03-03 NOTE — Progress Notes (Signed)
Subjective:    Patient ID: [name], male    DOB: September 08, 1945, 68 y.o.   MRN: [id number]  HPI  68 year old African American male with uncontrolled type 2 diabetes, hypertension and possible right wrist ganglion cyst for followup. Patient reports his blood sugars have been fairly stable. His morning blood sugars between 130 and 140. He denies any hypoglycemia.   Hypertension-stable. Good response to higher dose of losartan.  Right ganglion cyst-due to his health insurance he was declined by Dr. Theodis Sato. We're awaiting referral to [facility].  Review of Systems Negative for chest pain. No paresthesias    Past Medical History  Diagnosis Date  . Diabetes mellitus, type 2   . Hypertension   . Hyperlipidemia   . Erectile dysfunction   . Aortic stenosis     followed by Dr. Acie Fredrickson    History   Social History  . Marital Status: Married    Spouse Name: N/A    Number of Children: N/A  . Years of Education: N/A   Occupational History  . Not on file.   Social History Main Topics  . Smoking status: Never Smoker   . Smokeless tobacco: Never Used  . Alcohol Use: No  . Drug Use: No  . Sexual Activity: Yes   Other Topics Concern  . Not on file   Social History Narrative   Married to [name] for 47 years   Retired as Chiropodist   5 children   Never Smoked   Alcohol use- no   Regular exercise: walks around the block ea day   Caffeine use: occasional     No past surgical history on file.  Family History  Problem Relation Age of Onset  . Cancer Father     Throat cancer    No Known Allergies  Current Outpatient Prescriptions on File Prior to Visit  Medication Sig Dispense Refill  . aspirin 81 MG tablet Take 81 mg by mouth daily.        [name] Kitchen glucose blood (BAYER CONTOUR NEXT TEST) test strip Test 4 times daily  100 each  11  . Insulin Glargine (LANTUS SOLOSTAR) 100 UNIT/ML SOPN Inject 25 Units into the skin at bedtime and may repeat  dose one time if needed.  5 pen  5  . insulin lispro (HUMALOG KWIKPEN) 100 UNIT/ML SOPN Use 7 to 12 units three times daily 10-15 minutes before meals  15 mL  11  . Insulin Pen Needle (B-D ULTRAFINE III SHORT PEN) 31G X 8 MM MISC Use 4 times daily as directed  100 each  5  . losartan (COZAAR) 100 MG tablet Take 1 tablet (100 mg total) by mouth daily.  90 tablet  1  . metFORMIN (GLUCOPHAGE) 1000 MG tablet Take 1 tablet (1,000 mg total) by mouth 2 (two) times daily with a meal.  180 tablet  3  . simvastatin (ZOCOR) 40 MG tablet Take 1 tablet (40 mg total) by mouth at bedtime.  90 tablet  1   No current facility-administered medications on file prior to visit.    BP 124/88  Pulse 84  Temp(Src) 98.1 F (36.7 C) (Oral)  Ht 5' 11.5" (1.816 m)  Wt 201 lb (91.173 kg)  BMI 27.65 kg/m2    Objective:   Physical Exam  Constitutional: He is oriented to person, place, and time. He appears well-developed and well-nourished. No distress.  HENT:  Head: Normocephalic and atraumatic.  Cardiovascular: Normal rate and regular rhythm.  Murmur heard. Faint SEM RSB  Pulmonary/Chest: Effort normal and breath sounds normal. He has no wheezes.  Musculoskeletal: Normal range of motion.  Trace lower extremity edema bilaterally  Neurological: He is alert and oriented to person, place, and time.  Psychiatric: He has a normal mood and affect. His behavior is normal.          Assessment & Plan:

## 2013-03-05 ENCOUNTER — Telehealth: Payer: Self-pay

## 2013-03-05 NOTE — Telephone Encounter (Signed)
Relevant patient education assigned to patient using Emmi. ° °

## 2013-03-19 ENCOUNTER — Telehealth: Payer: Self-pay | Admitting: Internal Medicine

## 2013-03-19 ENCOUNTER — Encounter: Payer: Self-pay | Admitting: Internal Medicine

## 2013-03-19 NOTE — Telephone Encounter (Signed)
Relevant patient education mailed to patient.  

## 2013-03-25 ENCOUNTER — Other Ambulatory Visit (HOSPITAL_COMMUNITY): Payer: Self-pay

## 2013-03-28 ENCOUNTER — Ambulatory Visit: Payer: Self-pay | Admitting: Internal Medicine

## 2013-03-31 ENCOUNTER — Ambulatory Visit: Payer: Self-pay | Admitting: Internal Medicine

## 2013-04-28 ENCOUNTER — Encounter: Payer: Self-pay | Admitting: Internal Medicine

## 2013-05-02 ENCOUNTER — Ambulatory Visit: Payer: Self-pay | Admitting: Internal Medicine

## 2013-05-07 ENCOUNTER — Encounter: Payer: Self-pay | Admitting: Internal Medicine

## 2013-05-07 ENCOUNTER — Ambulatory Visit (INDEPENDENT_AMBULATORY_CARE_PROVIDER_SITE_OTHER): Payer: Medicare PPO | Admitting: Internal Medicine

## 2013-05-07 VITALS — BP 134/90 | HR 84 | Temp 98.1°F | Ht 71.5 in | Wt 197.0 lb

## 2013-05-07 DIAGNOSIS — R0989 Other specified symptoms and signs involving the circulatory and respiratory systems: Secondary | ICD-10-CM

## 2013-05-07 DIAGNOSIS — E1149 Type 2 diabetes mellitus with other diabetic neurological complication: Secondary | ICD-10-CM

## 2013-05-07 DIAGNOSIS — IMO0002 Reserved for concepts with insufficient information to code with codable children: Secondary | ICD-10-CM

## 2013-05-07 DIAGNOSIS — I10 Essential (primary) hypertension: Secondary | ICD-10-CM

## 2013-05-07 DIAGNOSIS — E1165 Type 2 diabetes mellitus with hyperglycemia: Secondary | ICD-10-CM

## 2013-05-07 LAB — BASIC METABOLIC PANEL
BUN: 14 mg/dL (ref 6–23)
CHLORIDE: 101 meq/L (ref 96–112)
CO2: 27 meq/L (ref 19–32)
Calcium: 10.3 mg/dL (ref 8.4–10.5)
Creatinine, Ser: 0.9 mg/dL (ref 0.4–1.5)
GFR: 104.59 mL/min (ref >60.00)
Glucose, Bld: 126 mg/dL — ABNORMAL HIGH (ref 70–99)
POTASSIUM: 4 meq/L (ref 3.5–5.1)
Sodium: 138 mEq/L (ref 135–145)

## 2013-05-07 LAB — MICROALBUMIN / CREATININE URINE RATIO
Creatinine,U: 101.6 mg/dL
MICROALB/CREAT RATIO: 3.2 mg/g (ref 0.0–30.0)
Microalb, Ur: 3.3 mg/dL — ABNORMAL HIGH (ref 0.0–1.9)

## 2013-05-07 LAB — HEMOGLOBIN A1C: HEMOGLOBIN A1C: 7.7 % — AB (ref 4.6–6.5)

## 2013-05-07 MED ORDER — INSULIN GLARGINE 100 UNIT/ML SOLOSTAR PEN: 25.0000 [IU] | PEN_INJECTOR | Freq: Every day | SUBCUTANEOUS | Status: DC

## 2013-05-07 MED ORDER — AMLODIPINE BESYLATE 2.5 MG PO TABS: 2.5000 mg | ORAL_TABLET | Freq: Every day | ORAL | Status: DC

## 2013-05-07 NOTE — Progress Notes (Signed)
Subjective:    Patient ID: [name], male    DOB: [birth year], 68 y.o.   MRN: [id number]  HPI  68 year old African American male with history of uncontrolled type 2 diabetes, hypertension and hyperlipidemia for routine followup. Patient reports his blood sugars are improving. He is currently using Lantus 25 units at bedtime and 8 units of Humalog before meals. Patient also taking Amaryl 1 mg daily.  He has rare hypoglycemia.  Patient seen by ophthalmologist-Dr. Satira Sark. Patient found to have background diabetic retinopathy. Eye exam completed on 04/01/2013. Patient was advised to followup with ophthalmologist in 6 months.   Review of Systems Negative for chest pain.  His cardiologist does not accept his insurance.  No vision loss or visual changes.    Past Medical History  Diagnosis Date  . Diabetes mellitus, type 2   . Hypertension   . Hyperlipidemia   . Erectile dysfunction   . Aortic stenosis     followed by Dr. Acie Fredrickson    History   Social History  . Marital Status: Married    Spouse Name: N/A    Number of Children: N/A  . Years of Education: N/A   Occupational History  . Not on file.   Social History Main Topics  . Smoking status: Never Smoker   . Smokeless tobacco: Never Used  . Alcohol Use: No  . Drug Use: No  . Sexual Activity: Yes   Other Topics Concern  . 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  Family History  Problem Relation Age of Onset  . Cancer Father     Throat cancer    No Known Allergies  Current Outpatient Prescriptions on File Prior to Visit  Medication Sig Dispense Refill  . aspirin 81 MG tablet Take 81 mg by mouth daily.        [name] Kitchen glimepiride (AMARYL) 1 MG tablet Take 1 tablet (1 mg total) by  mouth daily.  90 tablet  1  . glucose blood (BAYER CONTOUR NEXT TEST) test strip Test 4 times daily  100 each  11  . Insulin Glargine (LANTUS SOLOSTAR) 100 UNIT/ML Solostar Pen Inject 25 Units into the skin at bedtime and may repeat dose one time if needed.  5 pen  5  . insulin lispro (HUMALOG KWIKPEN) 100 UNIT/ML SOPN Use 7 to 12 units three times daily 10-15 minutes before meals  15 mL  11  . Insulin Pen Needle (B-D ULTRAFINE III SHORT PEN) 31G X 8 MM MISC Use 4 times daily as directed  100 each  5  . losartan (COZAAR) 100 MG tablet Take 1 tablet (100 mg total) by mouth daily.  90 tablet  1  . metFORMIN (GLUCOPHAGE) 1000 MG tablet Take 1 tablet (1,000 mg total) by mouth 2 (two) times daily with a meal.  180 tablet  1  . simvastatin (ZOCOR) 40 MG tablet Take 1 tablet (40 mg total) by mouth at bedtime.  90 tablet  1   No current facility-administered medications on file prior to visit.    BP 134/90  Pulse 84  Temp(Src) 98.1 F (36.7 C) (Oral)  Ht 5' 11.5" (1.816 m)  Wt 197 lb (  89.359 kg)  BMI 27.10 kg/m2    Objective:   Physical Exam  Constitutional: He appears well-developed and well-nourished. No distress.  HENT:  Head: Normocephalic and atraumatic.  Eyes: EOM are normal. Pupils are equal, round, and reactive to light.  Neck: Neck supple.  Left carotid bruit  Cardiovascular: Normal rate and regular rhythm.   Murmur heard. Pulmonary/Chest: Breath sounds normal. He has no wheezes.  Musculoskeletal:  Trace lower extremity edema bilaterally  Skin: Skin is warm and dry.  Psychiatric: He has a normal mood and affect. His behavior is normal.          Assessment & Plan:

## 2013-05-07 NOTE — Progress Notes (Signed)
Pre visit review using our clinic review tool, if applicable. No additional management support is needed unless otherwise documented below in the visit note. 

## 2013-05-07 NOTE — Assessment & Plan Note (Signed)
Blood sugar control is improving. Continue same dose of Lantus. Increase Humalog dose to 10 units before meals.  Discontinue Amaryl.  Patient understands he may need to further increase mealtime insulin as Amaryl was discontinued.  Patient reports being seen by ophthalmologist - Dr Satira Sark. He is being followed for background diabetic retinopathy. He has repeat diabetic eye exam planned in 6 months. Lab Results  Component Value Date   HGBA1C 7.7* 05/07/2013

## 2013-05-07 NOTE — Assessment & Plan Note (Signed)
Patient has faint left carotid bruit on exam. Arrange carotid duplex.  Continue simvastatin and aspirin therapy

## 2013-05-07 NOTE — Assessment & Plan Note (Signed)
Stable.  No change in medication regimen. BP: 134/90 mmHg

## 2013-05-08 ENCOUNTER — Telehealth: Payer: Self-pay | Admitting: Internal Medicine

## 2013-05-08 NOTE — Telephone Encounter (Signed)
Relevant patient education assigned to patient using Emmi. ° °

## 2013-05-27 ENCOUNTER — Telehealth: Payer: Self-pay

## 2013-05-27 NOTE — Telephone Encounter (Signed)
Relevant patient education assigned to patient using Emmi. ° °

## 2013-05-28 ENCOUNTER — Other Ambulatory Visit (HOSPITAL_COMMUNITY): Payer: Medicare PPO

## 2013-06-06 ENCOUNTER — Other Ambulatory Visit (HOSPITAL_COMMUNITY): Payer: Medicare PPO

## 2013-06-06 ENCOUNTER — Encounter: Payer: Self-pay | Admitting: Cardiology

## 2013-06-06 ENCOUNTER — Ambulatory Visit (HOSPITAL_COMMUNITY): Payer: Medicare PPO | Attending: Cardiology | Admitting: Cardiology

## 2013-06-06 DIAGNOSIS — I359 Nonrheumatic aortic valve disorder, unspecified: Secondary | ICD-10-CM | POA: Insufficient documentation

## 2013-06-06 NOTE — Progress Notes (Signed)
Echo completed

## 2013-06-09 ENCOUNTER — Encounter: Payer: Self-pay | Admitting: Internal Medicine

## 2013-07-09 ENCOUNTER — Ambulatory Visit: Payer: Self-pay | Admitting: Internal Medicine

## 2013-10-27 ENCOUNTER — Ambulatory Visit: Payer: Medicare PPO | Admitting: Internal Medicine

## 2013-10-28 ENCOUNTER — Ambulatory Visit (INDEPENDENT_AMBULATORY_CARE_PROVIDER_SITE_OTHER): Payer: Medicare PPO | Admitting: Physician Assistant

## 2013-10-28 ENCOUNTER — Encounter: Payer: Self-pay | Admitting: Physician Assistant

## 2013-10-28 VITALS — BP 124/80 | HR 60 | Temp 98.4°F | Resp 18 | Wt 199.0 lb

## 2013-10-28 DIAGNOSIS — M79641 Pain in right hand: Secondary | ICD-10-CM

## 2013-10-28 DIAGNOSIS — M79609 Pain in unspecified limb: Secondary | ICD-10-CM

## 2013-10-28 NOTE — Patient Instructions (Addendum)
Call Dr. Marcello Moores Office and schedule a follow up appointment.   They will be able to evaluate your persistent swelling and pain.  If emergency symptoms discussed during visit developed, seek medical attention immediately.  Followup as needed, or for worsening or persistent symptoms despite treatment.    Ganglion Cyst A ganglion cyst is a noncancerous, fluid-filled lump that occurs near joints or tendons. The ganglion cyst grows out of a joint or the lining of a tendon. It most often develops in the hand or wrist but can also develop in the shoulder, elbow, hip, knee, ankle, or foot. The round or oval ganglion can be pea sized or larger than a grape. Increased activity may enlarge the size of the cyst because more fluid starts to build up.  CAUSES  It is not completely known what causes a ganglion cyst to grow. However, it may be related to:  Inflammation or irritation around the joint.  An injury.  Repetitive movements or overuse.  Arthritis. SYMPTOMS  A lump most often appears in the hand or wrist, but can occur in other areas of the body. Generally, the lump is painless without other symptoms. However, sometimes pain can be felt during activity or when pressure is applied to the lump. The lump may even be tender to the touch. Tingling, pain, numbness, or muscle weakness can occur if the ganglion cyst presses on a nerve. Your grip may be weak and you may have less movement in your joints.  DIAGNOSIS  Ganglion cysts are most often diagnosed based on a physical exam, noting where the cyst is and how it looks. Your caregiver will feel the lump and may shine a light alongside it. If it is a ganglion, a light often shines through it. Your caregiver may order an X-ray, ultrasound, or MRI to rule out other conditions. TREATMENT  Ganglions usually go away on their own without treatment. If pain or other symptoms are involved, treatment may be needed. Treatment is also needed if the ganglion limits  your movement or if it gets infected. Treatment options include:  Wearing a wrist or finger brace or splint.  Taking anti-inflammatory medicine.  Draining fluid from the lump with a needle (aspiration).  Injecting a steroid into the joint.  Surgery to remove the ganglion cyst and its stalk that is attached to the joint or tendon. However, ganglion cysts can grow back. HOME CARE INSTRUCTIONS   Do not press on the ganglion, poke it with a needle, or hit it with a heavy object. You may rub the lump gently and often. Sometimes fluid moves out of the cyst.  Only take medicines as directed by your caregiver.  Wear your brace or splint as directed by your caregiver. SEEK MEDICAL CARE IF:   Your ganglion becomes larger or more painful.  You have increased redness, red streaks, or swelling.  You have pus coming from the lump.  You have weakness or numbness in the affected area. MAKE SURE YOU:   Understand these instructions.  Will watch your condition.  Will get help right away if you are not doing well or get worse. Document Released: 01/28/2000 Document Revised: 10/25/2011 Document Reviewed: 03/26/2007 [facility] Patient Information 2015 [place], [place]. This information is not intended to replace advice given to you by your health care provider. Make sure you discuss any questions you have with your health care provider.

## 2013-10-28 NOTE — Progress Notes (Signed)
Subjective:    Patient ID: [name], male    DOB: 09-30-1945, 68 y.o.   MRN: [id number]  HPI Patient is a 68 y.o. male presenting for hand swelling and pain. Pt states that he had a cyst removed from his hand in May and his hand has been swollen and painful ever since. He went to see his surgeon after the surgery, and the swelling and pain had been attributed to the surgery, and he thought it would resolve given time. He states that he cannot completely close his hand into a fist and he feels numbness and burning in his fingertips. He also feel like his strength has decreased. He has not tried anything to relieve his symptoms, other than tylenol and advil, however neither has provided any relief. Patient denies fevers, chills, nausea, vomiting, diarrhea, shortness of breath, chest pain, headache, syncope.    Review of Systems As per HPI and are otherwise negative.   Past Medical History  Diagnosis Date  . Diabetes mellitus, type 2   . Hypertension   . Hyperlipidemia   . Erectile dysfunction   . Aortic stenosis     followed by Dr. Acie Fredrickson    History   Social History  . Marital Status: Married    Spouse Name: N/A    Number of Children: N/A  . Years of Education: N/A   Occupational History  . Not on file.   Social History Main Topics  . Smoking status: Never Smoker   . Smokeless tobacco: Never Used  . Alcohol Use: No  . Drug Use: No  . Sexual Activity: Yes   Other Topics Concern  . 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 No pertinent past surgical history.  Family History  Problem Relation Age of Onset  . Cancer Father     Throat cancer    No Known Allergies  Current Outpatient Prescriptions on File Prior to Visit  Medication Sig  Dispense Refill  . amLODipine (NORVASC) 2.5 MG tablet Take 1 tablet (2.5 mg total) by mouth daily.  90 tablet  1  . aspirin 81 MG tablet Take 81 mg by mouth daily.        [name] Kitchen glucose blood (BAYER CONTOUR NEXT TEST) test strip Test 4 times daily  100 each  11  . Insulin Glargine (LANTUS SOLOSTAR) 100 UNIT/ML Solostar Pen Inject 25 Units into the skin daily at 10 pm.  5 pen  5  . insulin lispro (HUMALOG KWIKPEN) 100 UNIT/ML SOPN Use 7 to 12 units three times daily 10-15 minutes before meals  15 mL  11  . Insulin Pen Needle (B-D ULTRAFINE III SHORT PEN) 31G X 8 MM MISC Use 4 times daily as directed  100 each  5  . losartan (COZAAR) 100 MG tablet Take 1 tablet (100 mg total) by mouth daily.  90 tablet  1  . metFORMIN (GLUCOPHAGE) 1000 MG tablet Take 1 tablet (1,000 mg total) by mouth 2 (two) times daily with a meal.  180 tablet  1  . simvastatin (ZOCOR) 40 MG tablet Take 1 tablet (40 mg total) by mouth at bedtime.  90 tablet  1   No current facility-administered medications on file  prior to visit.    EXAM: BP 124/80  Pulse 60  Temp(Src) 98.4 F (36.9 C) (Oral)  Resp 18  Wt 199 lb (90.266 kg)     Objective:   Physical Exam  Nursing note and vitals reviewed. Constitutional: He is oriented to person, place, and time. He appears well-developed and well-nourished. No distress.  HENT:  Head: Normocephalic and atraumatic.  Eyes: Conjunctivae and EOM are normal.  Cardiovascular: Normal rate, regular rhythm and intact distal pulses.   Pulmonary/Chest: Effort normal and breath sounds normal. No respiratory distress. He exhibits no tenderness.  Musculoskeletal: Normal range of motion.  The Right hand is mildly swollen compared with the left. There is no erythema. The hand is not TTP.  Neurological: He is alert and oriented to person, place, and time.  Grip strength decreased in right hand compared to left. Sensation intact.  Skin: Skin is warm and dry. He is not diaphoretic. No pallor.    Psychiatric: He has a normal mood and affect. His behavior is normal. Judgment and thought content normal.    Lab Results  Component Value Date   WBC 7.8 11/04/2010   HGB 13.8 11/04/2010   HCT 41.0 11/04/2010   PLT 195.0 11/04/2010   GLUCOSE 126* 05/07/2013   CHOL 185 09/23/2012   TRIG 82.0 09/23/2012   HDL 91.90 09/23/2012   LDLDIRECT 112.6 05/14/2012   LDLCALC 77 09/23/2012   ALT 48 05/14/2012   AST 37 05/14/2012   NA 138 05/07/2013   K 4.0 05/07/2013   CL 101 05/07/2013   CREATININE 0.9 05/07/2013   BUN 14 05/07/2013   CO2 27 05/07/2013   TSH 1.46 05/14/2012   PSA 0.64 09/23/2012   HGBA1C 7.7* 05/07/2013   MICROALBUR 3.3* 05/07/2013         Assessment & Plan:  Gahel was seen today for right hand swelling and pain.  Diagnoses and associated orders for this visit:  Pain of right hand Comments: Following hand surgery in May. Will have pt schedule follow up with hand surgeon.    Return precautions provided, and patient handout on Ganglion cyst.  Plan to follow up as needed, or for worsening or persistent symptoms despite treatment.  Patient Instructions  Call Dr. Marcello Moores Office and schedule a follow up appointment.   They will be able to evaluate your persistent swelling and pain.  If emergency symptoms discussed during visit developed, seek medical attention immediately.  Followup as needed, or for worsening or persistent symptoms despite treatment.

## 2013-10-28 NOTE — Progress Notes (Signed)
Pre visit review using our clinic review tool, if applicable. No additional management support is needed unless otherwise documented below in the visit note. 

## 2013-11-07 LAB — HM DIABETES EYE EXAM

## 2013-11-12 ENCOUNTER — Other Ambulatory Visit: Payer: Self-pay | Admitting: Internal Medicine

## 2013-11-12 ENCOUNTER — Encounter: Payer: Self-pay | Admitting: Internal Medicine

## 2013-11-28 ENCOUNTER — Other Ambulatory Visit: Payer: Self-pay

## 2013-12-02 ENCOUNTER — Ambulatory Visit: Payer: Medicare PPO | Admitting: Internal Medicine

## 2013-12-18 ENCOUNTER — Telehealth: Payer: Self-pay | Admitting: Internal Medicine

## 2013-12-18 DIAGNOSIS — IMO0002 Reserved for concepts with insufficient information to code with codable children: Secondary | ICD-10-CM

## 2013-12-18 DIAGNOSIS — E1165 Type 2 diabetes mellitus with hyperglycemia: Principal | ICD-10-CM

## 2013-12-18 DIAGNOSIS — E1149 Type 2 diabetes mellitus with other diabetic neurological complication: Secondary | ICD-10-CM

## 2013-12-18 MED ORDER — INSULIN PEN NEEDLE 31G X 8 MM MISC
Status: DC
Start: 2013-12-18 — End: 2014-04-24

## 2013-12-18 NOTE — Telephone Encounter (Signed)
[id number] [place], [place] is requesting re-fill on Insulin Pen Needle (B-D ULTRAFINE III SHORT PEN) 31G X 8 MM MISC. Pt is out

## 2013-12-18 NOTE — Telephone Encounter (Signed)
rx sent in electronically 

## 2013-12-29 ENCOUNTER — Encounter: Payer: Self-pay | Admitting: Internal Medicine

## 2013-12-29 ENCOUNTER — Ambulatory Visit (INDEPENDENT_AMBULATORY_CARE_PROVIDER_SITE_OTHER): Payer: Medicare PPO | Admitting: Internal Medicine

## 2013-12-29 VITALS — BP 134/90 | HR 76 | Temp 98.7°F | Ht 71.5 in | Wt 197.0 lb

## 2013-12-29 DIAGNOSIS — M79641 Pain in right hand: Secondary | ICD-10-CM

## 2013-12-29 DIAGNOSIS — E1149 Type 2 diabetes mellitus with other diabetic neurological complication: Secondary | ICD-10-CM

## 2013-12-29 DIAGNOSIS — Z23 Encounter for immunization: Secondary | ICD-10-CM

## 2013-12-29 DIAGNOSIS — E785 Hyperlipidemia, unspecified: Secondary | ICD-10-CM

## 2013-12-29 DIAGNOSIS — I350 Nonrheumatic aortic (valve) stenosis: Secondary | ICD-10-CM

## 2013-12-29 DIAGNOSIS — IMO0002 Reserved for concepts with insufficient information to code with codable children: Secondary | ICD-10-CM

## 2013-12-29 DIAGNOSIS — I359 Nonrheumatic aortic valve disorder, unspecified: Secondary | ICD-10-CM

## 2013-12-29 DIAGNOSIS — E1141 Type 2 diabetes mellitus with diabetic mononeuropathy: Secondary | ICD-10-CM

## 2013-12-29 DIAGNOSIS — I10 Essential (primary) hypertension: Secondary | ICD-10-CM

## 2013-12-29 DIAGNOSIS — E1165 Type 2 diabetes mellitus with hyperglycemia: Secondary | ICD-10-CM

## 2013-12-29 LAB — HEMOGLOBIN A1C: Hgb A1c MFr Bld: 8.2 % — ABNORMAL HIGH (ref 4.6–6.5)

## 2013-12-29 LAB — MICROALBUMIN / CREATININE URINE RATIO
Creatinine,U: 195.9 mg/dL
MICROALB/CREAT RATIO: 2 mg/g (ref 0.0–30.0)
Microalb, Ur: 3.9 mg/dL — ABNORMAL HIGH (ref 0.0–1.9)

## 2013-12-29 NOTE — Progress Notes (Signed)
Pre visit review using our clinic review tool, if applicable. No additional management support is needed unless otherwise documented below in the visit note. 

## 2013-12-29 NOTE — Assessment & Plan Note (Signed)
Continue same insulins and oral agents.  Monitor A1c.

## 2013-12-29 NOTE — Assessment & Plan Note (Signed)
Arrange follow up visit with Dr. Acie Fredrickson.

## 2013-12-29 NOTE — Progress Notes (Signed)
Subjective:    Patient ID: [name], male    DOB: 1945/11/13, 68 y.o.   MRN: [id number]  HPI  68 year old African-American male with history of type 2 diabetes uncontrolled, hypertension, hyperlipidemia and aortic valve disease for routine follow-up.   Patient complains of persistent pain in right hand/wrist. He complains of burning sensation and chronic swelling. Patient followed by Dr. Grandville Silos. He is not sure whether his symptoms may be secondary to carpal tunnel syndrome.  Type 2 diabetes uncontrolled-his fasting blood sugar this morning was 135. Patient using combination of Lantus, Humalog with metformin thousand milligrams twice daily.  In March 2015 there was question of possible left carotid bruit. He was referred for carotid Doppler. Unclear from review of medical records whether the study was completed. He did have repeat echocardiogram in April 2015. Patient's EF estimated between range of 55-60%. Aortic valve has severe thickening and calcification.  Valve area 1.37 cm^2 (VTI).  Valve area 1.09 cm^2.    Review of Systems Negative for chest pain, right hand and wrist swelling.  No redness    Past Medical History  Diagnosis Date  . Diabetes mellitus, type 2   . Hypertension   . Hyperlipidemia   . Erectile dysfunction   . Aortic stenosis     followed by Dr. Acie Fredrickson    History   Social History  . Marital Status: Married    Spouse Name: N/A    Number of Children: N/A  . Years of Education: N/A   Occupational History  . Not on file.   Social History Main Topics  . Smoking status: Never Smoker   . Smokeless tobacco: Never Used  . Alcohol Use: No  . Drug Use: No  . Sexual Activity: Yes   Other Topics Concern  . 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  Family History  Problem Relation Age of Onset  . Cancer Father     Throat cancer    No Known Allergies  Current Outpatient Prescriptions on File Prior to Visit  Medication Sig Dispense Refill  . amLODipine (NORVASC) 2.5 MG tablet Take 1 tablet (2.5 mg total) by mouth daily. 90 tablet 1  . aspirin 81 MG tablet Take 81 mg by mouth daily.      [name] Kitchen glucose blood (BAYER CONTOUR NEXT TEST) test strip Test 4 times daily 100 each 11  . Insulin Glargine (LANTUS SOLOSTAR) 100 UNIT/ML Solostar Pen Inject 25 Units into the skin daily at 10 pm. 5 pen 5  . insulin lispro (HUMALOG KWIKPEN) 100 UNIT/ML SOPN Use 7 to 12 units three times daily 10-15 minutes before meals 15 mL 11  . Insulin Pen Needle (B-D ULTRAFINE III SHORT PEN) 31G X 8 MM MISC Use 4 times daily as directed 100 each 5  . losartan (COZAAR) 100 MG tablet Take 1 tablet (100 mg total) by mouth daily. 90 tablet 1  . metFORMIN (GLUCOPHAGE) 1000 MG tablet TAKE 1 TABLET (1,000 MG TOTAL) BY MOUTH 2 (TWO) TIMES DAILY WITH A MEAL. 180 tablet 0  . simvastatin (ZOCOR) 40 MG tablet Take 1 tablet (40 mg total) by mouth at bedtime. 90 tablet 1   No current  facility-administered medications on file prior to visit.    BP 134/90 mmHg  Pulse 76  Temp(Src) 98.7 F (37.1 C) (Oral)  Ht 5' 11.5" (1.816 m)  Wt 197 lb (89.359 kg)  BMI 27.10 kg/m2   Lab Results  Component Value Date   HGBA1C 7.7* 05/07/2013   HGBA1C 8.8* 12/26/2012   HGBA1C 9.1* 09/23/2012   Lab Results  Component Value Date   MICROALBUR 3.3* 05/07/2013   LDLCALC 77 09/23/2012   CREATININE 0.9 05/07/2013     Objective:   Physical Exam  Constitutional: He is oriented to person, place, and time. He appears well-developed and well-nourished.  HENT:  Head: Normocephalic and atraumatic.  Neck: Neck supple.  Cardiovascular: Normal rate and regular rhythm.   Murmur heard. Pulmonary/Chest: Effort normal and breath sounds normal. He has no  wheezes.  Musculoskeletal: He exhibits no edema.  Right hand edema, negative phalens test.  Positive tinels right hand.  Mild atrophy of right thenar eminence.  Neurological: He is alert and oriented to person, place, and time. No cranial nerve deficit.  Skin: Skin is warm and dry.  Psychiatric: He has a normal mood and affect. His behavior is normal.          Assessment & Plan:

## 2013-12-29 NOTE — Assessment & Plan Note (Signed)
Patient complains of chronic right hand pain and swelling since May 2015. Patient status post surgery for ganglion cyst removal. Unclear whether his symptoms from possible carpal tunnel syndrome. Arrange nerve conduction study.

## 2013-12-29 NOTE — Assessment & Plan Note (Addendum)
Stable. No change in medication.  Monitor electrolytes and kidney function.  BP: 134/90 mmHg

## 2013-12-30 LAB — HEPATIC FUNCTION PANEL
ALBUMIN: 4.5 g/dL (ref 3.5–5.2)
ALK PHOS: 58 U/L (ref 39–117)
ALT: 24 U/L (ref 0–53)
AST: 25 U/L (ref 0–37)
Bilirubin, Direct: 0.1 mg/dL (ref 0.0–0.3)
Total Bilirubin: 0.6 mg/dL (ref 0.2–1.2)
Total Protein: 7.5 g/dL (ref 6.0–8.3)

## 2013-12-30 LAB — LIPID PANEL
Cholesterol: 213 mg/dL — ABNORMAL HIGH (ref 0–200)
HDL: 96.9 mg/dL (ref >39.00)
LDL Cholesterol: 106 mg/dL — ABNORMAL HIGH (ref 0–99)
NONHDL: 116.1
TRIGLYCERIDES: 51 mg/dL (ref 0.0–149.0)
Total CHOL/HDL Ratio: 2
VLDL: 10.2 mg/dL (ref 0.0–40.0)

## 2013-12-30 LAB — BASIC METABOLIC PANEL
BUN: 12 mg/dL (ref 6–23)
CHLORIDE: 104 meq/L (ref 96–112)
CO2: 27 mEq/L (ref 19–32)
Calcium: 9.7 mg/dL (ref 8.4–10.5)
Creatinine, Ser: 1 mg/dL (ref 0.4–1.5)
GFR: 96 mL/min (ref >60.00)
Glucose, Bld: 134 mg/dL — ABNORMAL HIGH (ref 70–99)
Potassium: 4.6 mEq/L (ref 3.5–5.1)
Sodium: 137 mEq/L (ref 135–145)

## 2013-12-31 ENCOUNTER — Other Ambulatory Visit: Payer: Self-pay | Admitting: *Deleted

## 2013-12-31 DIAGNOSIS — M79641 Pain in right hand: Secondary | ICD-10-CM

## 2014-01-23 ENCOUNTER — Encounter: Payer: Self-pay | Admitting: Cardiovascular Disease

## 2014-01-23 ENCOUNTER — Ambulatory Visit (INDEPENDENT_AMBULATORY_CARE_PROVIDER_SITE_OTHER): Payer: Medicare PPO | Admitting: Cardiovascular Disease

## 2014-01-23 VITALS — BP 124/82 | HR 66 | Ht 71.5 in | Wt 194.1 lb

## 2014-01-23 DIAGNOSIS — E785 Hyperlipidemia, unspecified: Secondary | ICD-10-CM

## 2014-01-23 DIAGNOSIS — I359 Nonrheumatic aortic valve disorder, unspecified: Secondary | ICD-10-CM

## 2014-01-23 DIAGNOSIS — I10 Essential (primary) hypertension: Secondary | ICD-10-CM

## 2014-01-23 NOTE — Assessment & Plan Note (Signed)
His aortic valve is stable. He has mild aortic dilitation Will continue to follow this

## 2014-01-23 NOTE — Patient Instructions (Signed)
Your physician recommends that you continue on your current medications as directed. Please refer to the Current Medication list given to you today.  Your physician wants you to follow-up in: 1 year with Dr. [name].  You will receive a reminder letter in the mail two months in advance. If you don't receive a letter, please call our office to schedule the follow-up appointment.  

## 2014-01-23 NOTE — Progress Notes (Signed)
[name] Date of Birth  21-Nov-1945 [place]  [address]    [place]   [place], [place]  [address]    [place], [place]  [address] [phone]  Fax  [phone]  [phone]  Fax [phone]  Problems: 1. Chest pain 2. Hypercholesterolemia 3. Diabetes mellitus 4. 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 Coronary calcifications by CT scan 6. Pulmonary nodule 7. Mild aortic stenosis   History of Present Illness:  [name] is a 68 year old gentleman who presents today for further evaluation of his chest pain. These chest pains are described as a left-sided pain. He seemed to come on with rest and exertion.  The pains last for hours.    He has been quite regularly.  He does not do any regular exercise.  He works as a Sports coach.  He had a stress Myoview study which was normal. His echocardiogram revealed  Left ventricle: The cavity size was normal. Wall thickness was increased in a pattern of mild LVH. Systolic function was normal. The estimated ejection fraction was in the range of 55% to 60%. 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 Mean gradient: 58mm Hg (S). Peak gradient: 23mm Hg (S). - Atrial septum: No defect or patent foramen ovale was identified.  He still has some occasional episodes of chest pain that he describes as a "pins and needles" like sensation.  He's able to get out and exercise without any problems.  Dec. 11, 2015:  [name] is a 68 yo who I follow for episodes of chest pain, mild aortic stenosis, mild aortic dilitation He is seen back after a 2 year absence.   He is now retired from being a Media planner,  Now semi retired - works 4 hours a day.   No CP or dyspnea.   Walks occasionally ,  A recent echo showed mild AS and trace MR.  He has mild aortic dilitation.     Current Outpatient Prescriptions on  File Prior to Visit  Medication Sig Dispense Refill  . amLODipine (NORVASC) 2.5 MG tablet Take 1 tablet (2.5 mg total) by mouth daily. 90 tablet 1  . aspirin 81 MG tablet Take 81 mg by mouth daily.      [name] Kitchen glucose blood (BAYER CONTOUR NEXT TEST) test strip Test 4 times daily 100 each 11  . Insulin Glargine (LANTUS SOLOSTAR) 100 UNIT/ML Solostar Pen Inject 25 Units into the skin daily at 10 pm. 5 pen 5  . insulin lispro (HUMALOG KWIKPEN) 100 UNIT/ML SOPN Use 7 to 12 units three times daily 10-15 minutes before meals 15 mL 11  . Insulin Pen Needle (B-D ULTRAFINE III SHORT PEN) 31G X 8 MM MISC Use 4 times daily as directed 100 each 5  . losartan (COZAAR) 100 MG tablet Take 1 tablet (100 mg total) by mouth daily. 90 tablet 1  . metFORMIN (GLUCOPHAGE) 1000 MG tablet TAKE 1 TABLET (1,000 MG TOTAL) BY MOUTH 2 (TWO) TIMES DAILY WITH A MEAL. 180 tablet 0  . simvastatin (ZOCOR) 40 MG tablet Take 1 tablet (40 mg total) by mouth at bedtime. 90 tablet 1   No current facility-administered medications on file prior to visit.    No Known Allergies  Past Medical History  Diagnosis Date  . Diabetes mellitus, type 2   . Hypertension   . Hyperlipidemia   .  Erectile dysfunction   . Aortic stenosis     followed by Dr. Acie Fredrickson    No past surgical history on file.  History  Smoking status  . Never Smoker   Smokeless tobacco  . Never Used    History  Alcohol Use No    Family History  Problem Relation Age of Onset  . Cancer Father     Throat cancer    Reviw of Systems:  Reviewed in the HPI.  All other systems are negative.  Physical Exam: Height 5' 11.5" (1.816 m). General: Well developed, well nourished, in no acute distress.  Head: Normocephalic, atraumatic, sclera non-icteric, mucus membranes are moist,   Neck: Supple. Carotids are 2 + without bruits. No JVD  Lungs: Clear bilaterally to auscultation.  Heart: regular rate  There is a 2/6 systolic murmur.  Abdomen: Soft, non-tender,  non-distended with normal bowel sounds. No hepatomegaly. No rebound/guarding. No masses.  Msk:  Strength and tone are normal  Extremities: No clubbing or cyanosis. No edema.  Distal pedal pulses are 2+ and equal bilaterally.  Neuro: Alert and oriented X 3. Moves all extremities spontaneously.  Psych:  Responds to questions appropriately with a normal affect.  ECG: Dec. 11, 2015:  NSR at 66, NS ST wave abn.   Assessment / Plan:

## 2014-01-23 NOTE — Assessment & Plan Note (Signed)
BP is stable. contiue current meds, diet and exercise

## 2014-02-02 ENCOUNTER — Ambulatory Visit (INDEPENDENT_AMBULATORY_CARE_PROVIDER_SITE_OTHER): Payer: Medicare PPO | Admitting: Neurology

## 2014-02-02 DIAGNOSIS — M79641 Pain in right hand: Secondary | ICD-10-CM

## 2014-02-03 NOTE — Procedures (Signed)
[facility]  [place], [address]  [place], [place] [address] Tel: [phone] Fax:  [phone] Test Date:  02/02/2014  Patient: [name] DOB: 1946-02-05 Physician: Narda Amber, DO  Sex: Male Height: 5' 11.5" Ref Phys: Drema Pry  ID#: [id number] Temp:  Technician: Laureen Ochs R. NCS T.   Patient Complaints: Patient is a 68 year old male here for evaluation of his right hand s/p cyst removal April 2015 still having symptoms of constant numbness and tingling.  NCV & EMG Findings: Extensive electrodiagnostic testing of the right upper extremity and additional studies of the left shows: 1. Right median sensory response is absent. Bilateral ulnar and the left median sensory responses show prolonged distal latency with preserved amplitude, however, the right ulnar sensory response is asymmetrically reduced. Bilateral radial sensory responses are within normal limits. 2. Right median motor response shows markedly prolonged distal latency and reduced amplitude. The left median motor response showed borderline prolonged distal latency with preserved amplitude, and mild conduction velocity slowing in the forearm. Bilateral ulnar motor studies show slowing across the forearm, and the right motor response is borderline reduced in amplitude. 3. Chronic motor axon loss changes are seen affecting the bilateral C8 and the right T1 myotomes, without accompanied active denervation.  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 Overall, these findings are moderate in degree electrically and worse on the right side.   Alternatively, multiple entrapment neuropathies including median neuropathy at the wrist and ulnar neuropathy at the elbow bilaterally cannot be excluded.   Clinical correlation recommended to determine if additional electrodiagnostic testing  of the lower extremities is indicated.  ___________________________ Narda Amber, DO    Nerve Conduction Studies Anti Sensory Summary Table   Site NR Peak (ms) Norm Peak (ms) P-T Amp (V) Norm P-T Amp  Left Median Anti Sensory (2nd Digit)  Wrist    5.1 <3.8 15.8 >10  Right Median Anti Sensory (2nd Digit)  Wrist NR  <3.8  >10  Left Radial Anti Sensory (Base 1st Digit)  Wrist    2.7 <2.8 19.4 >10  Right Radial Anti Sensory (Base 1st Digit)  Wrist    2.4 <2.8 16.9 >10  Left Ulnar Anti Sensory (5th Digit)  Wrist    3.6 <3.2 15.3 >5  Right Ulnar Anti Sensory (5th Digit)  Wrist    3.3 <3.2 8.1 >5   Motor Summary Table   Site NR Onset (ms) Norm Onset (ms) O-P Amp (mV) Norm O-P Amp Site1 Site2 Delta-0 (ms) Dist (cm) Vel (m/s) Norm Vel (m/s)  Left Median Motor (Abd Poll Brev)  Wrist    4.1 <4.0 12.1 >5 Elbow Wrist 7.6 35.0 46 >50  Elbow    11.7  10.6         Right Median Motor (Abd Poll Brev)  Wrist    7.8 <4.0 3.2 >5 Elbow Wrist 6.5 32.5 50 >50  Elbow    14.3  2.9         Left Ulnar Motor (Abd Dig Minimi)  Wrist    3.0 <3.1 9.1 >7 B Elbow Wrist 4.7 24.0 51 >50  B Elbow    7.7  7.2  A Elbow B Elbow 2.3 10.0 43 >50  A Elbow    10.0  6.5         Right Ulnar Motor (Abd Dig Minimi)  Wrist    2.4 <3.1 6.8 >7 B Elbow Wrist 4.8  26.0 54 >50  B Elbow    7.2  6.1  A Elbow B Elbow 2.3 10.0 43 >50  A Elbow    9.5  5.1          F Wave Studies   NR F-Lat (ms) Lat Norm (ms) L-R F-Lat (ms)  Left Ulnar (Mrkrs) (Abd Dig Min)     35.44 <33    EMG   Side Muscle Ins Act Fibs Psw Fasc Number Recrt Dur Dur. Amp Amp. Poly Poly. Comment  Right 1stDorInt Nml Nml Nml Nml 1- Mod-R Few 1+ Few 1+ Nml Nml N/A  Right Abd Poll Brev Nml Nml Nml Nml 3- Rapid Some 1+ Some 1+ Nml Nml N/A  Right FlexPolLong Nml Nml Nml Nml 1- Mod Nml Nml Nml Nml Nml Nml N/A  Right Ext Indicis Nml Nml Nml Nml Nml Nml Nml Nml Nml Nml Nml Nml N/A  Right PronatorTeres Nml Nml Nml Nml Nml Nml Nml Nml Nml Nml Nml Nml N/A  Right  Biceps Nml Nml Nml Nml Nml Nml Nml Nml Nml Nml Nml Nml N/A  Right Triceps Nml Nml Nml Nml 1- Mod-R Some 1+ Some 1+ Nml Nml N/A  Right Deltoid Nml Nml Nml Nml Nml Nml Nml Nml Nml Nml Nml Nml N/A  Right FlexDigProf 4,5 Nml Nml Nml Nml 1- Mod-R Few 1+ Few 1+ Few 1+ N/A  Left 1stDorInt Nml Nml Nml Nml Nml Nml Nml Nml Nml Nml Nml Nml N/A  Left Abd Poll Brev Nml Nml Nml Nml Nml Nml Nml Nml Nml Nml Nml Nml N/A  Left Ext Indicis Nml Nml Nml Nml Nml Nml Nml Nml Nml Nml Nml Nml N/A  Left PronatorTeres Nml Nml Nml Nml Nml Nml Nml Nml Nml Nml Nml Nml N/A  Left Biceps Nml Nml Nml Nml Nml Nml Nml Nml Nml Nml Nml Nml N/A  Left Triceps Nml Nml Nml Nml 1- Rapid Some 1+ Some 1+ Nml Nml N/A  Left FlexDigProf 4,5 Nml Nml Nml Nml 1- Mod-R Many 1+ Some 1+ Nml Nml N/A      Waveforms:

## 2014-02-09 ENCOUNTER — Ambulatory Visit: Payer: Self-pay | Admitting: Internal Medicine

## 2014-02-18 ENCOUNTER — Ambulatory Visit: Payer: Self-pay | Admitting: Internal Medicine

## 2014-02-19 ENCOUNTER — Encounter: Payer: Self-pay | Admitting: Internal Medicine

## 2014-02-19 ENCOUNTER — Other Ambulatory Visit: Payer: Self-pay | Admitting: Internal Medicine

## 2014-02-19 MED ORDER — METFORMIN HCL 1000 MG PO TABS: 1000.0000 mg | ORAL_TABLET | Freq: Two times a day (BID) | ORAL | Status: DC

## 2014-02-19 NOTE — Telephone Encounter (Signed)
Rx sent to pharmacy   

## 2014-02-25 ENCOUNTER — Ambulatory Visit: Payer: Self-pay | Admitting: Family Medicine

## 2014-02-26 ENCOUNTER — Ambulatory Visit (INDEPENDENT_AMBULATORY_CARE_PROVIDER_SITE_OTHER): Payer: Medicare PPO | Admitting: Family Medicine

## 2014-02-26 ENCOUNTER — Encounter: Payer: Self-pay | Admitting: Family Medicine

## 2014-02-26 VITALS — BP 110/80 | Temp 98.5°F | Wt 195.0 lb

## 2014-02-26 DIAGNOSIS — IMO0002 Reserved for concepts with insufficient information to code with codable children: Secondary | ICD-10-CM

## 2014-02-26 DIAGNOSIS — M79641 Pain in right hand: Secondary | ICD-10-CM

## 2014-02-26 DIAGNOSIS — E1165 Type 2 diabetes mellitus with hyperglycemia: Principal | ICD-10-CM

## 2014-02-26 DIAGNOSIS — E1141 Type 2 diabetes mellitus with diabetic mononeuropathy: Secondary | ICD-10-CM

## 2014-02-26 DIAGNOSIS — E1149 Type 2 diabetes mellitus with other diabetic neurological complication: Secondary | ICD-10-CM

## 2014-02-26 NOTE — Patient Instructions (Signed)
Call Dr. Fredna Dow and group at the [facility] and make an appointment to be seen ASAP    Continue your diabetes treatment as outlined by Dr. Shawna Orleans

## 2014-02-26 NOTE — Progress Notes (Signed)
   Subjective:    Patient ID: [name], male    DOB: 01-31-1946, 69 y.o.   MRN: [id number]  HPI [name] is a 69 year old male who comes in today for evaluation 2 problems  He was seen by Dr. you month ago A1c was 8.2% blood sugar 134. Based on that.[name] decreased his Lantus from 25 units a day to 24 and decrease his sliding scale to 10 units before meals but only twice daily. I will leave this up to Dr. you  He tells me he had surgery in his right hand surgeon has discharge and but he still has some pain and decreased range of motion. He saw Dr. you for this problem he did a EMG EMG shows various diagnoses. I think the best thing at this point time to send him to a specialist for evaluation   Review of Systems    review of systems otherwise negative Objective:   Physical Exam Well-developed well-nourished male no acute distress vital signs stable is afebrile examination right hand shows some swelling on the dorsum of hand scar from previous surgery and decreased range of motion       Assessment & Plan:  Diabetes type 1...Brandon KitchenMarland KitchenMarland Mathis managed by Dr. you  Pain and swelling right hand......... consult with Dr. Fredna Dow and group

## 2014-02-26 NOTE — Progress Notes (Signed)
Pre visit review using our clinic review tool, if applicable. No additional management support is needed unless otherwise documented below in the visit note. 

## 2014-03-04 ENCOUNTER — Telehealth: Payer: Self-pay | Admitting: Internal Medicine

## 2014-03-04 NOTE — Telephone Encounter (Signed)
Error

## 2014-03-10 ENCOUNTER — Telehealth: Payer: Self-pay | Admitting: Internal Medicine

## 2014-03-10 NOTE — Telephone Encounter (Signed)
Pt needs refill called to [facility] in [place] for his Humalog pen.

## 2014-03-11 NOTE — Telephone Encounter (Signed)
What medicine?

## 2014-03-12 MED ORDER — INSULIN LISPRO 100 UNIT/ML (KWIKPEN): PEN_INJECTOR | SUBCUTANEOUS | Status: DC

## 2014-03-12 NOTE — Telephone Encounter (Signed)
See Previous message - Humalog pen please.

## 2014-03-12 NOTE — Telephone Encounter (Signed)
My mistake, rx sent in electronically

## 2014-03-20 ENCOUNTER — Telehealth: Payer: Self-pay | Admitting: Internal Medicine

## 2014-03-20 NOTE — Telephone Encounter (Signed)
Pt would like to transfer her care to the [facility] office. He wishes you well and hopes you feel better but would like to have continuity.

## 2014-03-23 NOTE — Telephone Encounter (Signed)
Noted. Good luck to him!

## 2014-04-02 ENCOUNTER — Encounter: Payer: Self-pay | Admitting: Internal Medicine

## 2014-04-02 DIAGNOSIS — R197 Diarrhea, unspecified: Secondary | ICD-10-CM | POA: Diagnosis not present

## 2014-04-02 DIAGNOSIS — R630 Anorexia: Secondary | ICD-10-CM | POA: Insufficient documentation

## 2014-04-02 DIAGNOSIS — Z794 Long term (current) use of insulin: Secondary | ICD-10-CM | POA: Insufficient documentation

## 2014-04-02 DIAGNOSIS — Z79899 Other long term (current) drug therapy: Secondary | ICD-10-CM | POA: Insufficient documentation

## 2014-04-02 DIAGNOSIS — I10 Essential (primary) hypertension: Secondary | ICD-10-CM | POA: Insufficient documentation

## 2014-04-02 DIAGNOSIS — Z87448 Personal history of other diseases of urinary system: Secondary | ICD-10-CM | POA: Diagnosis not present

## 2014-04-02 DIAGNOSIS — E11649 Type 2 diabetes mellitus with hypoglycemia without coma: Secondary | ICD-10-CM | POA: Insufficient documentation

## 2014-04-02 DIAGNOSIS — E785 Hyperlipidemia, unspecified: Secondary | ICD-10-CM | POA: Insufficient documentation

## 2014-04-02 DIAGNOSIS — Z7982 Long term (current) use of aspirin: Secondary | ICD-10-CM | POA: Insufficient documentation

## 2014-04-02 DIAGNOSIS — N390 Urinary tract infection, site not specified: Secondary | ICD-10-CM | POA: Insufficient documentation

## 2014-04-03 ENCOUNTER — Encounter: Payer: Self-pay | Admitting: Internal Medicine

## 2014-04-03 ENCOUNTER — Encounter (HOSPITAL_COMMUNITY): Payer: Self-pay | Admitting: Emergency Medicine

## 2014-04-03 ENCOUNTER — Ambulatory Visit (INDEPENDENT_AMBULATORY_CARE_PROVIDER_SITE_OTHER): Payer: Medicare PPO | Admitting: Internal Medicine

## 2014-04-03 ENCOUNTER — Emergency Department (HOSPITAL_COMMUNITY): Payer: Medicare PPO

## 2014-04-03 ENCOUNTER — Emergency Department (HOSPITAL_COMMUNITY)
Admission: EM | Admit: 2014-04-03 | Discharge: 2014-04-03 | Disposition: A | Discharge status: Home / Self Care | Payer: Medicare PPO | Attending: Emergency Medicine | Admitting: Emergency Medicine

## 2014-04-03 VITALS — BP 130/84 | HR 80 | Temp 98.2°F | Wt 198.0 lb

## 2014-04-03 DIAGNOSIS — E1141 Type 2 diabetes mellitus with diabetic mononeuropathy: Secondary | ICD-10-CM

## 2014-04-03 DIAGNOSIS — R8290 Unspecified abnormal findings in urine: Secondary | ICD-10-CM

## 2014-04-03 DIAGNOSIS — N390 Urinary tract infection, site not specified: Secondary | ICD-10-CM

## 2014-04-03 DIAGNOSIS — IMO0002 Reserved for concepts with insufficient information to code with codable children: Secondary | ICD-10-CM

## 2014-04-03 DIAGNOSIS — M6740 Ganglion, unspecified site: Secondary | ICD-10-CM

## 2014-04-03 DIAGNOSIS — E162 Hypoglycemia, unspecified: Secondary | ICD-10-CM

## 2014-04-03 DIAGNOSIS — E1149 Type 2 diabetes mellitus with other diabetic neurological complication: Secondary | ICD-10-CM

## 2014-04-03 DIAGNOSIS — E1165 Type 2 diabetes mellitus with hyperglycemia: Secondary | ICD-10-CM

## 2014-04-03 DIAGNOSIS — M79641 Pain in right hand: Secondary | ICD-10-CM

## 2014-04-03 LAB — CBC WITH DIFFERENTIAL/PLATELET
BASOS PCT: 0 % (ref 0–1)
Basophils Absolute: 0 10*3/uL (ref 0.0–0.1)
EOS ABS: 0.1 10*3/uL (ref 0.0–0.7)
EOS PCT: 1 % (ref 0–5)
HCT: 37 % — ABNORMAL LOW (ref 39.0–52.0)
Hemoglobin: 12.5 g/dL — ABNORMAL LOW (ref 13.0–17.0)
LYMPHS ABS: 1.9 10*3/uL (ref 0.7–4.0)
Lymphocytes Relative: 16 % (ref 12–46)
MCH: 30 pg (ref 26.0–34.0)
MCHC: 33.8 g/dL (ref 30.0–36.0)
MCV: 88.9 fL (ref 78.0–100.0)
MONOS PCT: 6 % (ref 3–12)
Monocytes Absolute: 0.8 10*3/uL (ref 0.1–1.0)
Neutro Abs: 9.3 10*3/uL — ABNORMAL HIGH (ref 1.7–7.7)
Neutrophils Relative %: 77 % (ref 43–77)
PLATELETS: 231 10*3/uL (ref 150–400)
RBC: 4.16 MIL/uL — AB (ref 4.22–5.81)
RDW: 13 % (ref 11.5–15.5)
WBC: 12.1 10*3/uL — ABNORMAL HIGH (ref 4.0–10.5)

## 2014-04-03 LAB — COMPREHENSIVE METABOLIC PANEL
ALT: 26 U/L (ref 0–53)
AST: 36 U/L (ref 0–37)
Albumin: 4 g/dL (ref 3.5–5.2)
Alkaline Phosphatase: 64 U/L (ref 39–117)
Anion gap: 9 (ref 5–15)
BILIRUBIN TOTAL: 0.6 mg/dL (ref 0.3–1.2)
BUN: 11 mg/dL (ref 6–23)
CALCIUM: 9.4 mg/dL (ref 8.4–10.5)
CO2: 27 mmol/L (ref 19–32)
CREATININE: 1.05 mg/dL (ref 0.50–1.35)
Chloride: 101 mmol/L (ref 96–112)
GFR calc Af Amer: 83 mL/min — ABNORMAL LOW (ref >90)
GFR calc non Af Amer: 72 mL/min — ABNORMAL LOW (ref >90)
GLUCOSE: 149 mg/dL — AB (ref 70–99)
Potassium: 3.8 mmol/L (ref 3.5–5.1)
Sodium: 137 mmol/L (ref 135–145)
Total Protein: 6.7 g/dL (ref 6.0–8.3)

## 2014-04-03 LAB — URINALYSIS, ROUTINE W REFLEX MICROSCOPIC
Bilirubin Urine: NEGATIVE
Glucose, UA: NEGATIVE mg/dL
HGB URINE DIPSTICK: NEGATIVE
KETONES UR: NEGATIVE mg/dL
Nitrite: NEGATIVE
PROTEIN: NEGATIVE mg/dL
Specific Gravity, Urine: 1.022 (ref 1.005–1.030)
Urobilinogen, UA: 1 mg/dL (ref 0.0–1.0)
pH: 6.5 (ref 5.0–8.0)

## 2014-04-03 LAB — CBG MONITORING, ED: GLUCOSE-CAPILLARY: 114 mg/dL — AB (ref 70–99)

## 2014-04-03 LAB — POCT URINALYSIS DIPSTICK
Bilirubin, UA: NEGATIVE
Blood, UA: NEGATIVE
Glucose, UA: NEGATIVE
Ketones, UA: NEGATIVE
LEUKOCYTES UA: NEGATIVE
Nitrite, UA: NEGATIVE
PH UA: 5.5
Protein, UA: NEGATIVE
Spec Grav, UA: 1.02
Urobilinogen, UA: 1

## 2014-04-03 LAB — URINE MICROSCOPIC-ADD ON

## 2014-04-03 MED ORDER — LEVOFLOXACIN 750 MG PO TABS: 750.0000 mg | ORAL_TABLET | Freq: Every day | ORAL | Status: DC

## 2014-04-03 MED ORDER — LEVOFLOXACIN 750 MG PO TABS
750.0000 mg | ORAL_TABLET | Freq: Once | ORAL | Status: AC
  Administered 2014-04-03: 750 mg via ORAL

## 2014-04-03 MED ORDER — CIPROFLOXACIN HCL 250 MG PO TABS: 250.0000 mg | ORAL_TABLET | Freq: Two times a day (BID) | ORAL | Status: DC

## 2014-04-03 MED ORDER — INSULIN LISPRO 100 UNIT/ML (KWIKPEN): PEN_INJECTOR | SUBCUTANEOUS | Status: DC

## 2014-04-03 NOTE — ED Notes (Signed)
Pt c/o hypoglycemia starting tonight. Reports checking glucose at home with reading at 57, had a soda, glucose increased into 80s. Pt c/o dizziness and nausea with hypoglycemia. Pt also reports diarrhea x 3 days.

## 2014-04-03 NOTE — Progress Notes (Signed)
Pre visit review using our clinic review tool, if applicable. No additional management support is needed unless otherwise documented below in the visit note. 

## 2014-04-03 NOTE — Discharge Instructions (Signed)
Low Blood Sugar [name],  Your blood sugar here was normal. Your urine shows an infection, this may be a cause of your low blood sugar. Take antibiotics as prescribed and follow-up with your regular physician within 3 days for continued management. If symptoms worsen come back to the emergency department immediately. Thank you. Low blood sugar (hypoglycemia) means that the level of sugar in your blood is lower than it should be. Signs of low blood sugar include:  Getting sweaty.  Feeling hungry.  Feeling dizzy or weak.  Feeling sleepier than normal.  Feeling nervous.  Headaches.  Having a fast heartbeat. Low blood sugar can happen fast and can be an emergency. Your doctor can do tests to check your blood sugar level. You can have low blood sugar and not have diabetes. HOME CARE  Check your blood sugar as told by your doctor. If it is less than 70 mg/dl or as told by your doctor, take 1 of the following:  3 to 4 glucose tablets.   cup clear juice.   cup soda pop, not diet.  1 cup milk.  5 to 6 hard candies.  Recheck blood sugar after 15 minutes. Repeat until it is at the right level.  Eat a snack if it is more than 1 hour until the next meal.  Only take medicine as told by your doctor.  Do not skip meals. Eat on time.  Do not drink alcohol except with meals.  Check your blood glucose before driving.  Check your blood glucose before and after exercise.  Always carry treatment with you, such as glucose pills.  Always wear a medical alert bracelet if you have diabetes. GET HELP RIGHT AWAY IF:   Your blood glucose goes below 70 mg/dl or as told by your doctor, and you:  Are confused.  Are not able to swallow.  Pass out (faint).  You cannot treat yourself. You may need someone to help you.  You have low blood sugar problems often.  You have problems from your medicines.  You are not feeling better after 3 to 4 days.  You have vision changes. MAKE  SURE YOU:   Understand these instructions.  Will watch this condition.  Will get help right away if you are not doing well or get worse. Document Released: 04/26/2009 Document Revised: 04/24/2011 Document Reviewed: 04/26/2009 [facility] Patient Information 2015 [place], [place]. This information is not intended to replace advice given to you by your health care provider. Make sure you discuss any questions you have with your health care provider. Urinary Tract Infection A urinary tract infection (UTI) can occur any place along the urinary tract. The tract includes the kidneys, ureters, bladder, and urethra. A type of germ called bacteria often causes a UTI. UTIs are often helped with antibiotic medicine.  HOME CARE   If given, take antibiotics as told by your doctor. Finish them even if you start to feel better.  Drink enough fluids to keep your pee (urine) clear or pale yellow.  Avoid tea, drinks with caffeine, and bubbly (carbonated) drinks.  Pee often. Avoid holding your pee in for a long time.  Pee before and after having sex (intercourse).  Wipe from front to back after you poop (bowel movement) if you are a woman. Use each tissue only once. GET HELP RIGHT AWAY IF:   You have back pain.  You have lower belly (abdominal) pain.  You have chills.  You feel sick to your stomach (nauseous).  You  throw up (vomit).  Your burning or discomfort with peeing does not go away.  You have a fever.  Your symptoms are not better in 3 days. MAKE SURE YOU:   Understand these instructions.  Will watch your condition.  Will get help right away if you are not doing well or get worse. Document Released: 07/19/2007 Document Revised: 10/25/2011 Document Reviewed: 08/31/2011 [facility] Patient Information 2015 [place], [place]. This information is not intended to replace advice given to you by your health care provider. Make sure you discuss any questions you have with your health care  provider.

## 2014-04-03 NOTE — ED Provider Notes (Signed)
CSN: [id number]     Arrival date & time 04/02/14  2348 History  This chart was scribed for Everlene Balls, MD by Chester Holstein, ED Scribe. This patient was seen in room D35C/D35C and the patient's care was started at 12:41 AM.    Chief Complaint  Patient presents with  . Hypoglycemia    Patient is a 69 y.o. male presenting with hypoglycemia. The history is provided by the patient. No language interpreter was used.  Hypoglycemia  HPI Comments: [name] is a 69 y.o. male who presents to the Emergency Department complaining of fluctuating blood sugar results.  Pt states he took his blood sugar this evening with results at 57, then 83 after drinking a soda, then 63. Pt notes general malaise and recurrent low blood sugar for a couple weeks. Pt notes assocaited nausea, dizziness, dry mouth, vomiting, diarrhea, and decreased appetite. Pt denies chest pain and SOB. He has had no fevers or recent infections.  Patient is otherwise in normal state of health.  Past Medical History  Diagnosis Date  . Diabetes mellitus, type 2   . Hypertension   . Hyperlipidemia   . Erectile dysfunction   . Aortic stenosis     followed by Dr. Acie Fredrickson   History reviewed. No pertinent past surgical history. Family History  Problem Relation Age of Onset  . Cancer Father     Throat cancer   History  Substance Use Topics  . Smoking status: Never Smoker   . Smokeless tobacco: Never Used  . Alcohol Use: No    Review of Systems A complete 10 system review of systems was obtained and all systems are negative except as noted in the HPI and PMH.     Allergies  Review of patient's allergies indicates no known allergies.  Home Medications   Prior to Admission medications   Medication Sig Start Date End Date Taking? Authorizing Provider  amLODipine (NORVASC) 2.5 MG tablet Take 1 tablet (2.5 mg total) by mouth daily. 05/07/13  Yes Doe-Hyun Kyra Searles, DO  aspirin 81 MG tablet Take 81 mg by mouth daily.     Yes  Historical Provider, MD  glucose blood (BAYER CONTOUR NEXT TEST) test strip Test 4 times daily 06/17/12  Yes Doe-Hyun R Shawna Orleans, DO  Insulin Glargine (LANTUS SOLOSTAR) 100 UNIT/ML Solostar Pen Inject 25 Units into the skin daily at 10 pm. Patient taking differently: Inject 24 Units into the skin daily at 10 pm.  05/07/13  Yes Doe-Hyun R Shawna Orleans, DO  insulin lispro (HUMALOG KWIKPEN) 100 UNIT/ML KiwkPen Use 7 to 12 units three times daily 10-15 minutes before meals Patient taking differently: Inject 10 Units into the skin 3 (three) times daily.  03/12/14  Yes Doe-Hyun R Shawna Orleans, DO  Insulin Pen Needle (B-D ULTRAFINE III SHORT PEN) 31G X 8 MM MISC Use 4 times daily as directed 12/18/13  Yes Doe-Hyun R Yoo, DO  losartan (COZAAR) 100 MG tablet Take 1 tablet (100 mg total) by mouth daily. 12/30/12  Yes Doe-Hyun R Shawna Orleans, DO  metFORMIN (GLUCOPHAGE) 1000 MG tablet Take 1 tablet (1,000 mg total) by mouth 2 (two) times daily with a meal. 02/19/14  Yes Doe-Hyun R Shawna Orleans, DO  simvastatin (ZOCOR) 40 MG tablet Take 1 tablet (40 mg total) by mouth at bedtime. 03/03/13  Yes Doe-Hyun R Shawna Orleans, DO   BP 147/83 mmHg  Pulse 80  Temp(Src) 98.3 F (36.8 C) (Oral)  Resp 14  Ht 6' (1.829 m)  Wt 206 lb (93.441 kg)  BMI 27.93 kg/m2  SpO2 99% Physical Exam  Constitutional: He is oriented to person, place, and time. Vital signs are normal. He appears well-developed and well-nourished.  Non-toxic appearance. He does not appear ill. No distress.  HENT:  Head: Normocephalic and atraumatic.  Nose: Nose normal.  Mouth/Throat: Oropharynx is clear and moist. No oropharyngeal exudate.  Eyes: Conjunctivae and EOM are normal. Pupils are equal, round, and reactive to light. No scleral icterus.  Neck: Normal range of motion. Neck supple. No tracheal deviation, no edema, no erythema and normal range of motion present. No thyroid mass and no thyromegaly present.  Cardiovascular: Normal rate, regular rhythm, S1 normal, S2 normal, normal heart sounds, intact  distal pulses and normal pulses.  Exam reveals no gallop and no friction rub.   No murmur heard. Pulses:      Radial pulses are 2+ on the right side, and 2+ on the left side.       Dorsalis pedis pulses are 2+ on the right side, and 2+ on the left side.  Pulmonary/Chest: Effort normal and breath sounds normal. No respiratory distress. He has no wheezes. He has no rhonchi. He has no rales.  Abdominal: Soft. Normal appearance and bowel sounds are normal. He exhibits no distension, no ascites and no mass. There is no hepatosplenomegaly. There is no tenderness. There is no rebound, no guarding and no CVA tenderness.  Musculoskeletal: Normal range of motion. He exhibits no edema or tenderness.  Lymphadenopathy:    He has no cervical adenopathy.  Neurological: He is alert and oriented to person, place, and time. He has normal strength. No cranial nerve deficit or sensory deficit.  Skin: Skin is warm, dry and intact. No petechiae and no rash noted. He is not diaphoretic. No erythema. No pallor.  Psychiatric: He has a normal mood and affect. His behavior is normal. Judgment normal.  Nursing note and vitals reviewed.   ED Course  Procedures (including critical care time) DIAGNOSTIC STUDIES: Oxygen Saturation is 99% on room air, normal by my interpretation.    COORDINATION OF CARE: 12:46 AM Discussed treatment plan with patient at beside, the patient agrees with the plan and has no further questions at this time.   Labs Review Labs Reviewed  CBC WITH DIFFERENTIAL/PLATELET - Abnormal; Notable for the following:    WBC 12.1 (*)    RBC 4.16 (*)    Hemoglobin 12.5 (*)    HCT 37.0 (*)    Neutro Abs 9.3 (*)    All other components within normal limits  COMPREHENSIVE METABOLIC PANEL - Abnormal; Notable for the following:    Glucose, Bld 149 (*)    GFR calc non Af Amer 72 (*)    GFR calc Af Amer 83 (*)    All other components within normal limits  URINALYSIS, ROUTINE W REFLEX MICROSCOPIC -  Abnormal; Notable for the following:    Leukocytes, UA SMALL (*)    All other components within normal limits  URINE MICROSCOPIC-ADD ON - Abnormal; Notable for the following:    Squamous Epithelial / LPF MANY (*)    Bacteria, UA MANY (*)    All other components within normal limits  CBG MONITORING, ED - Abnormal; Notable for the following:    Glucose-Capillary 114 (*)    All other components within normal limits    Imaging Review No results found.   EKG Interpretation   Date/Time:  Friday April 03 2014 00:50:32 EST Ventricular Rate:  68 PR Interval:  187 QRS  Duration: 94 QT Interval:  403 QTC Calculation: 429 R Axis:   -6 Text Interpretation:  Sinus rhythm Ventricular premature complex Inferior  infarct, old Confirmed by Glynn Octave [phone]) on 04/03/2014  2:11:26 AM      MDM   Final diagnoses:  None   Patient presents to the ED for low blood sugar.  EKG is nonischemic.  UA reveals an infection.  He has not changed his diet or medication dosages.  He has had diarrhea during the interval  The emergency department for low blood sugar.as well.  UTI may be caused of altered blood sugars and I will treat with levoquin.  First dose given in the ED, VS remain within his normal limits and he is safe for DC.    I personally performed the services described in this documentation, which was scribed in my presence. The recorded information has been reviewed and is accurate.    Everlene Balls, MD 04/03/14 [phone]

## 2014-04-03 NOTE — ED Notes (Signed)
Pt. Reports low blood sugar at home this evening 53 , ate supper and rechecked his CBG = 83 and then dropped to 63 prior to arrival , mild dizziness and lightheaded with nausea at arrival .

## 2014-04-03 NOTE — Patient Instructions (Signed)
Adjust your meal time insulin dose as directed Keep a log of your blood sugars and meals Our office will contact regarding referral to hand specialist - Dr. Drema Dallas

## 2014-04-03 NOTE — Assessment & Plan Note (Signed)
Chronic hand pain unchanged.  Refer to Dr. Drema Dallas.

## 2014-04-03 NOTE — Assessment & Plan Note (Addendum)
Patient experiencing labile blood sugar readings including significant hypoglycemia. I stressed importance of adjusting mealtime insulin dose based upon carbohydrate intake. Patient understands to not take any mealtime insulin if he does not ingest carbohydrates.  We also discuss lowering dose if he engages in physical activity.  Patient advised to use 5 units before low to medium carb meal and 10 units for high carb meal.  Patient will check post prandial blood sugars as directed.  I doubt patient has UTI.  Repeat UA and perform urine culture.  Reassess in 3 weeks.

## 2014-04-03 NOTE — Progress Notes (Signed)
Subjective:    Patient ID: [name], male    DOB: [birth year], 69 y.o.   MRN: [id number]  HPI  69 year old aftermath male with history of uncontrolled type 2 diabetes hypertension hyperlipidemia for follow-up. Patient accompanied by his supportive daughter. Patient complains she has had labile blood sugar readings over the past several weeks. He specifically recalls his blood sugar being over 350 in late January / beginning of February while he was traveling to [place].  Patient reports significant hypoglycemic episode last night. His blood sugar was 57.  He was seen in the emergency room yesterday for hypoglycemia. Workup included abnormal UA.  Patient diagnosed with possible UTI. He is not having any urinary symptoms. He has not filled his prescription for Levaquin 750 mg qd to be taken for 4 days.  Patient has not been adjusting his Humalog dose based on carbohydrate intake. He typically takes 10-12 units 3 times a day.  Patient also complains of chronic right hand pain. His symptoms started after surgery for ganglion cyst over right wrist. It has been difficult for him to find orthopedic specialist who accepts his insurance.  Review of Systems Hypoglycemia induces sweats and dizziness.  Negative for chest pain    Past Medical History  Diagnosis Date  . Diabetes mellitus, type 2   . Hypertension   . Hyperlipidemia   . Erectile dysfunction   . Aortic stenosis     followed by Dr. Acie Fredrickson    History   Social History  . Marital Status: Married    Spouse Name: N/A  . Number of Children: N/A  . Years of Education: N/A   Occupational History  . Not on file.   Social History Main Topics  . Smoking status: Never Smoker   . Smokeless tobacco: Never Used  . Alcohol Use: No  . Drug Use: No  . Sexual Activity: Yes   Other Topics Concern  . Not on file   Social History Narrative   Married to [name] for 62 years   Retired as Chiropodist   5 children    Never Smoked   Alcohol use- no   Regular exercise: walks around the block ea day   Caffeine use: occasional     No past surgical history on file.  Family History  Problem Relation Age of Onset  . Cancer Father     Throat cancer    No Known Allergies  Current Outpatient Prescriptions on File Prior to Visit  Medication Sig Dispense Refill  . amLODipine (NORVASC) 2.5 MG tablet Take 1 tablet (2.5 mg total) by mouth daily. 90 tablet 1  . aspirin 81 MG tablet Take 81 mg by mouth daily.      [name] Kitchen glucose blood (BAYER CONTOUR NEXT TEST) test strip Test 4 times daily 100 each 11  . Insulin Glargine (LANTUS SOLOSTAR) 100 UNIT/ML Solostar Pen Inject 25 Units into the skin daily at 10 pm. (Patient taking differently: Inject 24 Units into the skin daily at 10 pm. ) 5 pen 5  . Insulin Pen Needle (B-D ULTRAFINE III SHORT PEN) 31G X 8 MM MISC Use 4 times daily as directed 100 each 5  . metFORMIN (GLUCOPHAGE) 1000 MG tablet Take 1 tablet (1,000 mg total) by mouth 2 (two) times daily with a meal. 180 tablet 0  . simvastatin (ZOCOR) 40 MG tablet Take 1 tablet (40 mg total) by mouth at bedtime. 90 tablet 1  . losartan (COZAAR) 100 MG tablet Take 1  tablet (100 mg total) by mouth daily. (Patient not taking: Reported on 04/03/2014) 90 tablet 1   No current facility-administered medications on file prior to visit.    BP 130/84 mmHg  Pulse 80  Temp(Src) 98.2 F (36.8 C) (Oral)  Wt 198 lb (89.812 kg)    Objective:   Physical Exam  Constitutional: He is oriented to person, place, and time. He appears well-developed and well-nourished. No distress.  HENT:  Head: Normocephalic and atraumatic.  Cardiovascular: Normal rate, regular rhythm and normal heart sounds.   No murmur heard. Pulmonary/Chest: Effort normal and breath sounds normal. He has no wheezes.  Abdominal: Soft. Bowel sounds are normal. There is no tenderness.  Genitourinary: Rectum normal and prostate normal. Guaiac negative stool.    Musculoskeletal:  Right hand swelling, tender metacarpophalangeal joints  Neurological: He is alert and oriented to person, place, and time. No cranial nerve deficit.  Skin: Skin is warm and dry.  Psychiatric: He has a normal mood and affect. His behavior is normal.          Assessment & Plan:

## 2014-04-03 NOTE — ED Notes (Addendum)
CBG 114 

## 2014-04-07 LAB — URINE CULTURE
Colony Count: NO GROWTH
Organism ID, Bacteria: NO GROWTH

## 2014-04-24 ENCOUNTER — Ambulatory Visit (INDEPENDENT_AMBULATORY_CARE_PROVIDER_SITE_OTHER): Payer: Medicare PPO | Admitting: Internal Medicine

## 2014-04-24 ENCOUNTER — Encounter: Payer: Self-pay | Admitting: Internal Medicine

## 2014-04-24 VITALS — BP 124/72 | HR 70 | Temp 98.5°F | Ht 72.0 in | Wt 194.0 lb

## 2014-04-24 DIAGNOSIS — E1149 Type 2 diabetes mellitus with other diabetic neurological complication: Secondary | ICD-10-CM

## 2014-04-24 DIAGNOSIS — E1165 Type 2 diabetes mellitus with hyperglycemia: Principal | ICD-10-CM

## 2014-04-24 DIAGNOSIS — Z23 Encounter for immunization: Secondary | ICD-10-CM

## 2014-04-24 DIAGNOSIS — E1141 Type 2 diabetes mellitus with diabetic mononeuropathy: Secondary | ICD-10-CM

## 2014-04-24 DIAGNOSIS — IMO0002 Reserved for concepts with insufficient information to code with codable children: Secondary | ICD-10-CM

## 2014-04-24 MED ORDER — INSULIN GLARGINE 100 UNIT/ML SOLOSTAR PEN: 25.0000 [IU] | PEN_INJECTOR | Freq: Every day | SUBCUTANEOUS | Status: DC

## 2014-04-24 MED ORDER — GLUCOSE BLOOD VI STRP: ORAL_STRIP | Status: DC

## 2014-04-24 MED ORDER — LOSARTAN POTASSIUM 100 MG PO TABS: 100.0000 mg | ORAL_TABLET | Freq: Every day | ORAL | Status: DC

## 2014-04-24 MED ORDER — INSULIN PEN NEEDLE 31G X 8 MM MISC: Status: DC

## 2014-04-24 MED ORDER — SIMVASTATIN 40 MG PO TABS: 40.0000 mg | ORAL_TABLET | Freq: Every day | ORAL | Status: DC

## 2014-04-24 MED ORDER — AMLODIPINE BESYLATE 2.5 MG PO TABS: 2.5000 mg | ORAL_TABLET | Freq: Every day | ORAL | Status: DC

## 2014-04-24 MED ORDER — METFORMIN HCL 1000 MG PO TABS: 1000.0000 mg | ORAL_TABLET | Freq: Two times a day (BID) | ORAL | Status: DC

## 2014-04-24 NOTE — Assessment & Plan Note (Signed)
Blood sugars much improved.  Patient adjusting dose of meal time insulin as directed.  Monitor A1c before next office visit.

## 2014-04-24 NOTE — Progress Notes (Signed)
Pre visit review using our clinic review tool, if applicable. No additional management support is needed unless otherwise documented below in the visit note. 

## 2014-04-24 NOTE — Progress Notes (Signed)
Subjective:    Patient ID: [name], male    DOB: 12/13/1945, 69 y.o.   MRN: [id number]  HPI  69 year old African-American male with uncontrolled type 2 diabetes hypertension hyperlipidemia for follow-up. At last visit we reviewed proper use of mealtime insulin. Patient was experiencing hypoglycemic episodes. He is now adjusting mealtime insulin dose based upon carbohydrate intake for that meal. He has not hadn't had any further episodes of significant hypoglycemia. His fasting blood sugars are usually in the 140s to 130s.  Patient reports checking postprandial blood sugars. They're usually between 120 - 130.  He is scheduled to see orthopedic specialist tomorrow regarding chronic right hand pain. Patient also complains of intermittent left shoulder pain. He has difficulty laying on left side.  Review of Systems Negative for hypoglycemia    Past Medical History  Diagnosis Date  . Diabetes mellitus, type 2   . Hypertension   . Hyperlipidemia   . Erectile dysfunction   . Aortic stenosis     followed by Dr. Acie Fredrickson    History   Social History  . Marital Status: Married    Spouse Name: N/A  . Number of Children: N/A  . Years of Education: N/A   Occupational History  . Not on file.   Social History Main Topics  . Smoking status: Never Smoker   . Smokeless tobacco: Never Used  . Alcohol Use: No  . Drug Use: No  . Sexual Activity: Yes   Other Topics Concern  . Not on file   Social History Narrative   Married to [name] for 11 years   Retired as Chiropodist   5 children   Never Smoked   Alcohol use- no   Regular exercise: walks around the block ea day   Caffeine use: occasional     No past surgical history on file.  Family History  Problem Relation Age of Onset  . Cancer Father     Throat cancer    No Known Allergies  Current Outpatient Prescriptions on File Prior to Visit  Medication Sig Dispense Refill  . amLODipine (NORVASC) 2.5 MG  tablet Take 1 tablet (2.5 mg total) by mouth daily. 90 tablet 1  . aspirin 81 MG tablet Take 81 mg by mouth daily.      [name] Kitchen glucose blood (BAYER CONTOUR NEXT TEST) test strip Test 4 times daily 100 each 11  . Insulin Glargine (LANTUS SOLOSTAR) 100 UNIT/ML Solostar Pen Inject 25 Units into the skin daily at 10 pm. (Patient taking differently: Inject 24 Units into the skin daily at 10 pm. ) 5 pen 5  . insulin lispro (HUMALOG KWIKPEN) 100 UNIT/ML KiwkPen Use 5 to 10 units three times daily 10-15 minutes before meals 15 mL 3  . Insulin Pen Needle (B-D ULTRAFINE III SHORT PEN) 31G X 8 MM MISC Use 4 times daily as directed 100 each 5  . losartan (COZAAR) 100 MG tablet Take 1 tablet (100 mg total) by mouth daily. 90 tablet 1  . metFORMIN (GLUCOPHAGE) 1000 MG tablet Take 1 tablet (1,000 mg total) by mouth 2 (two) times daily with a meal. 180 tablet 0  . simvastatin (ZOCOR) 40 MG tablet Take 1 tablet (40 mg total) by mouth at bedtime. 90 tablet 1   No current facility-administered medications on file prior to visit.    BP 124/72 mmHg  Pulse 70  Temp(Src) 98.5 F (36.9 C) (Oral)  Ht 6' (1.829 m)  Wt 194 lb (87.998 kg)  BMI 26.31 kg/m2    Objective:   Physical Exam  Constitutional: He is oriented to person, place, and time. He appears well-developed and well-nourished.  Cardiovascular: Normal rate, regular rhythm and normal heart sounds.   Pulmonary/Chest: Effort normal and breath sounds normal. He has no wheezes.  Musculoskeletal:  Left shoulder discomfort with abduction and internal rotation  Neurological: He is alert and oriented to person, place, and time. No cranial nerve deficit.  Psychiatric: He has a normal mood and affect.          Assessment & Plan:

## 2014-06-12 ENCOUNTER — Telehealth: Payer: Self-pay | Admitting: Internal Medicine

## 2014-06-12 NOTE — Telephone Encounter (Signed)
° ° °  Pt given a new meter message cancel    Phamacy:

## 2014-08-10 ENCOUNTER — Other Ambulatory Visit: Payer: Self-pay

## 2014-08-31 ENCOUNTER — Telehealth: Payer: Self-pay | Admitting: Neurology

## 2014-08-31 ENCOUNTER — Encounter: Payer: Self-pay | Admitting: Internal Medicine

## 2014-08-31 NOTE — Telephone Encounter (Signed)
Patient informed that he needs to get the results from the referring doctor.  He said that he had already called them and they told him to call us but that he would try them again.

## 2014-08-31 NOTE — Telephone Encounter (Signed)
Pt [name]/DOB: 10-Feb-2046/called for results for the shock tx of his lt hand/call back [phone]

## 2014-09-09 ENCOUNTER — Ambulatory Visit (INDEPENDENT_AMBULATORY_CARE_PROVIDER_SITE_OTHER): Payer: Medicare PPO | Admitting: Internal Medicine

## 2014-09-09 ENCOUNTER — Encounter: Payer: Self-pay | Admitting: Internal Medicine

## 2014-09-09 VITALS — BP 142/88 | HR 78 | Temp 98.5°F | Resp 14 | Ht 72.0 in | Wt 194.0 lb

## 2014-09-09 DIAGNOSIS — E1165 Type 2 diabetes mellitus with hyperglycemia: Principal | ICD-10-CM

## 2014-09-09 DIAGNOSIS — M79641 Pain in right hand: Secondary | ICD-10-CM

## 2014-09-09 DIAGNOSIS — E1149 Type 2 diabetes mellitus with other diabetic neurological complication: Secondary | ICD-10-CM

## 2014-09-09 DIAGNOSIS — I10 Essential (primary) hypertension: Secondary | ICD-10-CM

## 2014-09-09 DIAGNOSIS — E1141 Type 2 diabetes mellitus with diabetic mononeuropathy: Secondary | ICD-10-CM | POA: Diagnosis not present

## 2014-09-09 DIAGNOSIS — IMO0002 Reserved for concepts with insufficient information to code with codable children: Secondary | ICD-10-CM

## 2014-09-09 LAB — BASIC METABOLIC PANEL
BUN: 12 mg/dL (ref 6–23)
CHLORIDE: 102 meq/L (ref 96–112)
CO2: 32 mEq/L (ref 19–32)
Calcium: 10.1 mg/dL (ref 8.4–10.5)
Creatinine, Ser: 0.98 mg/dL (ref 0.40–1.50)
GFR: 98.06 mL/min (ref >60.00)
Glucose, Bld: 103 mg/dL — ABNORMAL HIGH (ref 70–99)
POTASSIUM: 4.6 meq/L (ref 3.5–5.1)
SODIUM: 139 meq/L (ref 135–145)

## 2014-09-09 LAB — HEMOGLOBIN A1C: Hgb A1c MFr Bld: 7.7 % — ABNORMAL HIGH (ref 4.6–6.5)

## 2014-09-09 MED ORDER — GLUCOSE BLOOD VI STRP: ORAL_STRIP | Status: DC

## 2014-09-09 MED ORDER — INSULIN GLARGINE 100 UNIT/ML SOLOSTAR PEN: 25.0000 [IU] | PEN_INJECTOR | Freq: Every day | SUBCUTANEOUS | Status: DC

## 2014-09-09 MED ORDER — LOSARTAN POTASSIUM 100 MG PO TABS: 100.0000 mg | ORAL_TABLET | Freq: Every day | ORAL | Status: DC

## 2014-09-09 MED ORDER — INSULIN LISPRO 100 UNIT/ML (KWIKPEN): PEN_INJECTOR | SUBCUTANEOUS | Status: DC

## 2014-09-09 MED ORDER — AMLODIPINE BESYLATE 2.5 MG PO TABS: 2.5000 mg | ORAL_TABLET | Freq: Every day | ORAL | Status: DC

## 2014-09-09 MED ORDER — SIMVASTATIN 40 MG PO TABS: 40.0000 mg | ORAL_TABLET | Freq: Every day | ORAL | Status: DC

## 2014-09-09 MED ORDER — METFORMIN HCL 1000 MG PO TABS: 1000.0000 mg | ORAL_TABLET | Freq: Two times a day (BID) | ORAL | Status: DC

## 2014-09-09 MED ORDER — INSULIN PEN NEEDLE 31G X 8 MM MISC: Status: DC

## 2014-09-09 NOTE — Patient Instructions (Signed)
Please complete the following lab tests before your next follow up appointment: BMET, A1c, microalbumin/cr ratio - 250.02 FLP, LFTs - 272.4

## 2014-09-09 NOTE — Progress Notes (Signed)
Subjective:    Patient ID: URA HAUSEN, male    DOB: Sep 18, 1945, 69 y.o.   MRN: [id number]  HPI  69 year old African-American male with history of type 2 diabetes, hypertension, hyperlipidemia and chronic right hand pain for follow-up.  Patient reports his blood sugars are significantly improved. His morning blood sugar readings between 120 and 130.  Patient denies hypoglycemic episodes.  Patient reports his glucometer is malfunctioning.  He has not checked his blood sugars in the last several days.  Hypertension-stable  Patient has had difficulty seeing hand specialist due to insurance reasons. He still having ongoing right hand pain and swelling. Patient had nerve conduction study completed by Dr. Posey Pronto on 02/02/2014. It showed evidence of generalized sensorimotor polyneuropathy, primarily demyelinating was secondary axon loss, affecting the upper extremities.   Overall, these findings are moderate in degree electrically and worse on the right side.  Review of Systems Negative for chest pain.  Negative for paresthesias    Past Medical History  Diagnosis Date  . Diabetes mellitus, type 2   . Hypertension   . Hyperlipidemia   . Erectile dysfunction   . Aortic stenosis     followed by Dr. Acie Fredrickson    History   Social History  . Marital Status: Married    Spouse Name: N/A  . Number of Children: N/A  . Years of Education: N/A   Occupational History  . Not on file.   Social History Main Topics  . Smoking status: Never Smoker   . Smokeless tobacco: Never Used  . Alcohol Use: No  . Drug Use: No  . Sexual Activity: Yes   Other Topics Concern  . 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  Family History  Problem Relation Age of Onset   . Cancer Father     Throat cancer    No Known Allergies  Current Outpatient Prescriptions on File Prior to Visit  Medication Sig Dispense Refill  . amLODipine (NORVASC) 2.5 MG tablet Take 1 tablet (2.5 mg total) by mouth daily. 90 tablet 1  . aspirin 81 MG tablet Take 81 mg by mouth daily.      [name] Kitchen glucose blood (BAYER CONTOUR NEXT TEST) test strip Test 4 times daily 100 each 11  . Insulin Glargine (LANTUS SOLOSTAR) 100 UNIT/ML Solostar Pen Inject 25 Units into the skin daily at 10 pm. 5 pen 5  . insulin lispro (HUMALOG KWIKPEN) 100 UNIT/ML KiwkPen Use 5 to 10 units three times daily 10-15 minutes before meals 15 mL 3  . Insulin Pen Needle (B-D ULTRAFINE III SHORT PEN) 31G X 8 MM MISC Use 4 times daily as directed 100 each 5  . losartan (COZAAR) 100 MG tablet Take 1 tablet (100 mg total) by mouth daily. 90 tablet 1  . metFORMIN (GLUCOPHAGE) 1000 MG tablet Take 1 tablet (1,000 mg total) by mouth 2 (two) times daily with a meal. 180 tablet 0  . simvastatin (ZOCOR) 40 MG tablet Take 1 tablet (40 mg total) by mouth at bedtime. 90 tablet 1   No current facility-administered medications on file prior to visit.    BP 142/88 mmHg  Pulse 78  Temp(Src) 98.5 F (36.9  C) (Oral)  Resp 14  Ht 6' (1.829 m)  Wt 194 lb (87.998 kg)  BMI 26.31 kg/m2  SpO2 98%    Objective:   Physical Exam  Constitutional: He is oriented to person, place, and time. He appears well-developed and well-nourished. No distress.  HENT:  Head: Normocephalic and atraumatic.  Eyes: EOM are normal. Pupils are equal, round, and reactive to light.  Neck: Neck supple.  Cardiovascular: Normal rate, regular rhythm and normal heart sounds.   No murmur heard. Pulmonary/Chest: Effort normal and breath sounds normal. He has no wheezes.  Musculoskeletal:  Mild right hand swelling  Neurological: He is alert and oriented to person, place, and time. No cranial nerve deficit.  Skin: Skin is warm.  Psychiatric: He has a normal  mood and affect. His behavior is normal.          Assessment & Plan:   1.  Type 2 diabetes uncontrolled with neurologic manifestations 2.  Hypertension 3.  Chronic right hand pain  Plan:  Patient's home blood sugar readings significantly improved since adjusting his mealtime insulin dose based on carbohydrate intake. Continue same dose of Lantus and short-acting insulin.  His blood pressure is slightly elevated today. Patient advised to monitor blood pressure readings at home. Systolic blood pressure readings consistently above 140 we discussed increasing amlodipine to 5 mg.  Arrange consultation with new hand specialist. Patient likely has combination of diabetic polyneuropathy and possible carpal tunnel syndrome.

## 2014-09-10 DIAGNOSIS — G5601 Carpal tunnel syndrome, right upper limb: Secondary | ICD-10-CM | POA: Diagnosis not present

## 2014-09-24 ENCOUNTER — Ambulatory Visit (INDEPENDENT_AMBULATORY_CARE_PROVIDER_SITE_OTHER): Payer: Medicare PPO | Admitting: Neurology

## 2014-09-24 ENCOUNTER — Ambulatory Visit (INDEPENDENT_AMBULATORY_CARE_PROVIDER_SITE_OTHER): Payer: Self-pay | Admitting: Neurology

## 2014-09-24 ENCOUNTER — Encounter: Payer: Self-pay | Admitting: Neurology

## 2014-09-24 DIAGNOSIS — G5601 Carpal tunnel syndrome, right upper limb: Secondary | ICD-10-CM

## 2014-09-24 DIAGNOSIS — G5603 Carpal tunnel syndrome, bilateral upper limbs: Secondary | ICD-10-CM

## 2014-09-24 DIAGNOSIS — G5602 Carpal tunnel syndrome, left upper limb: Secondary | ICD-10-CM | POA: Diagnosis not present

## 2014-09-24 DIAGNOSIS — G5600 Carpal tunnel syndrome, unspecified upper limb: Secondary | ICD-10-CM | POA: Insufficient documentation

## 2014-09-24 HISTORY — DX: Carpal tunnel syndrome, unspecified upper limb: G56.00

## 2014-09-24 NOTE — Progress Notes (Signed)
Please refer to EMG and nerve conduction study procedure note. 

## 2014-09-24 NOTE — Procedures (Signed)
     HISTORY:  [name] is a 69 year old gentleman with a greater than one-year history of numbness in the right hand with some discomfort going up the forearm to the elbow on that side. He denies symptoms on the left side, and he denies any neck pain or shoulder discomfort. The patient is being evaluated for a possible neuropathy or a cervical radiculopathy.  NERVE CONDUCTION STUDIES:  Nerve conduction studies were performed on both upper extremities. The distal motor latencies for the median nerves were prolonged bilaterally with a borderline normal motor amplitude on the right, normal on the left. The distal motor latencies and motor amplitudes for the ulnar nerves were normal bilaterally. The F wave latencies for the median nerves were prolonged on the right, normal on the left, mildly prolonged for the right ulnar nerve, normal on the left ulnar nerve. The nerve conduction velocities for the median and ulnar nerves were normal bilaterally. The sensory latencies for the median nerves were absent on the right, prolonged on the left, and normal for the ulnar nerves bilaterally.  EMG STUDIES:  EMG study was performed on the right upper extremity:  The first dorsal interosseous muscle reveals 2 to 4 K units with full recruitment. No fibrillations or positive waves were noted. The abductor pollicis brevis muscle reveals 2 to 8 K units with moderately reduced recruitment. No fibrillations or positive waves were noted. The extensor indicis proprius muscle reveals 1 to 3 K units with full recruitment. No fibrillations or positive waves were noted. The pronator teres muscle reveals 2 to 3 K units with full recruitment. No fibrillations or positive waves were noted. The biceps muscle reveals 1 to 2 K units with full recruitment. No fibrillations or positive waves were noted. The triceps muscle reveals 2 to 4 K units with full recruitment. No fibrillations or positive waves were noted. The anterior  deltoid muscle reveals 2 to 3 K units with full recruitment. No fibrillations or positive waves were noted. The cervical paraspinal muscles were tested at 2 levels. No abnormalities of insertional activity were seen at either level tested. There was good relaxation.   IMPRESSION:  Nerve conduction studies done on both upper extremities shows evidence of a right carpal tunnel syndrome of moderate severity and a mild left carpal tunnel syndrome. EMG evaluation of the right upper extremity shows findings consistent with a chronic stable carpal tunnel syndrome, without evidence of an overlying cervical radiculopathy.  Jill Alexanders MD 09/24/2014 3:35 PM  [facility] [address] [place], [place] [address]  Phone [phone] Fax [phone]

## 2014-09-30 ENCOUNTER — Other Ambulatory Visit: Payer: Self-pay | Admitting: Internal Medicine

## 2014-12-02 ENCOUNTER — Encounter: Payer: Self-pay | Admitting: Internal Medicine

## 2014-12-04 ENCOUNTER — Other Ambulatory Visit: Payer: Medicare PPO

## 2014-12-09 ENCOUNTER — Ambulatory Visit: Payer: Medicare PPO | Admitting: Internal Medicine

## 2014-12-22 ENCOUNTER — Other Ambulatory Visit (INDEPENDENT_AMBULATORY_CARE_PROVIDER_SITE_OTHER): Payer: Medicare PPO

## 2014-12-22 DIAGNOSIS — I10 Essential (primary) hypertension: Secondary | ICD-10-CM

## 2014-12-22 DIAGNOSIS — E119 Type 2 diabetes mellitus without complications: Secondary | ICD-10-CM

## 2014-12-23 LAB — BASIC METABOLIC PANEL
BUN: 13 mg/dL (ref 6–23)
CO2: 28 mEq/L (ref 19–32)
Calcium: 10.1 mg/dL (ref 8.4–10.5)
Chloride: 101 mEq/L (ref 96–112)
Creatinine, Ser: 1 mg/dL (ref 0.40–1.50)
GFR: 95.71 mL/min (ref >60.00)
Glucose, Bld: 170 mg/dL — ABNORMAL HIGH (ref 70–99)
POTASSIUM: 4.2 meq/L (ref 3.5–5.1)
SODIUM: 138 meq/L (ref 135–145)

## 2014-12-23 LAB — HEMOGLOBIN A1C: Hgb A1c MFr Bld: 7.2 % — ABNORMAL HIGH (ref 4.6–6.5)

## 2014-12-25 ENCOUNTER — Encounter: Payer: Self-pay | Admitting: Adult Health

## 2014-12-25 ENCOUNTER — Ambulatory Visit (INDEPENDENT_AMBULATORY_CARE_PROVIDER_SITE_OTHER): Payer: Medicare PPO | Admitting: Adult Health

## 2014-12-25 VITALS — BP 140/80 | Temp 98.8°F | Ht 72.0 in | Wt 195.3 lb

## 2014-12-25 DIAGNOSIS — Z1211 Encounter for screening for malignant neoplasm of colon: Secondary | ICD-10-CM

## 2014-12-25 DIAGNOSIS — E1140 Type 2 diabetes mellitus with diabetic neuropathy, unspecified: Secondary | ICD-10-CM

## 2014-12-25 DIAGNOSIS — Z23 Encounter for immunization: Secondary | ICD-10-CM | POA: Diagnosis not present

## 2014-12-25 DIAGNOSIS — Z794 Long term (current) use of insulin: Secondary | ICD-10-CM

## 2014-12-25 DIAGNOSIS — I10 Essential (primary) hypertension: Secondary | ICD-10-CM

## 2014-12-25 DIAGNOSIS — E1165 Type 2 diabetes mellitus with hyperglycemia: Secondary | ICD-10-CM

## 2014-12-25 DIAGNOSIS — IMO0002 Reserved for concepts with insufficient information to code with codable children: Secondary | ICD-10-CM

## 2014-12-25 MED ORDER — AMLODIPINE BESYLATE 5 MG PO TABS: 5.0000 mg | ORAL_TABLET | Freq: Every day | ORAL | Status: DC

## 2014-12-25 MED ORDER — METFORMIN HCL 1000 MG PO TABS: 1000.0000 mg | ORAL_TABLET | Freq: Two times a day (BID) | ORAL | Status: DC

## 2014-12-25 MED ORDER — INSULIN PEN NEEDLE 31G X 8 MM MISC: Status: DC

## 2014-12-25 MED ORDER — GLUCOSE BLOOD VI STRP: ORAL_STRIP | Status: DC

## 2014-12-25 NOTE — Patient Instructions (Signed)
It was great meeting you today!  Your A1c is coming down great! Keep up the work.   Go up on your Norvasc from 2.5 mg to 5 mg. If your blood pressure is dropping to much please go back to the 2.5 mg.   Someone will call you to schedule your colonoscopy.   Follow up in 3 months.

## 2014-12-25 NOTE — Progress Notes (Signed)
Pre visit review using our clinic review tool, if applicable. No additional management support is needed unless otherwise documented below in the visit note. 

## 2014-12-25 NOTE — Progress Notes (Signed)
Subjective:    Patient ID: [name], male    DOB: Jul 23, 1945, 69 y.o.   MRN: [id number]  HPI 69 year old African-American male with history of type 2 diabetes, hypertension, hyperlipidemia and chronic right hand pain for follow-up.  Patient reports his blood sugars are significantly improved. His morning blood sugar readings between 120 and 130. Patient denies hypoglycemic episodes. Patient reports his glucometer is malfunctioning. He has not checked his blood sugars in the last several weeks.  Hypertension near stable, he continues to be in the 123456 systolic  He has followed up with Neurology for nerve conduction test which shows evidence of a right carpal tunnel syndrome of moderate severity and a mild left carpal tunnel syndrome. EMG evaluation of the right upper extremity shows findings consistent with a chronic stable carpal tunnel syndrome, without evidence of an overlying cervical radiculopathy. He is trying to figure out if he wants to do surgery.   He is due for a Colonoscopy so we will get him placed in the system for this.   Review of Systems  Constitutional: Negative.   HENT: Negative.   Eyes: Negative.   Respiratory: Negative.   Cardiovascular: Negative.   Gastrointestinal: Negative.   Genitourinary: Negative.   Musculoskeletal: Negative.   Neurological: Negative.   Hematological: Negative.   All other systems reviewed and are negative.  Past Medical History  Diagnosis Date  . Diabetes mellitus, type 2   . Hypertension   . Hyperlipidemia   . Erectile dysfunction   . Aortic stenosis     followed by Dr. Acie Fredrickson  . Carpal tunnel syndrome 09/24/2014    Bilateral    Social History   Social History  . Marital Status: Married    Spouse Name: N/A  . Number of Children: N/A  . Years of Education: N/A   Occupational History  . Not on file.   Social History Main Topics  . Smoking status: Never Smoker   . Smokeless tobacco: Never Used  . Alcohol Use:  No  . Drug Use: No  . Sexual Activity: Yes   Other Topics Concern  . 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  Family History  Problem Relation Age of Onset  . Cancer Father     Throat cancer    No Known Allergies  Current Outpatient Prescriptions on File Prior to Visit  Medication Sig Dispense Refill  . aspirin 81 MG tablet Take 81 mg by mouth daily.      . Insulin Glargine (LANTUS SOLOSTAR) 100 UNIT/ML Solostar Pen Inject 25 Units into the skin daily at 10 pm. 5 pen 5  . insulin lispro (HUMALOG KWIKPEN) 100 UNIT/ML KiwkPen Use 5 to 10 units three times daily 10-15 minutes before meals 15 mL 5  . losartan (COZAAR) 100 MG tablet Take 1 tablet (100 mg total) by mouth daily. 90 tablet 1  . metFORMIN (GLUCOPHAGE) 1000 MG tablet TAKE 1 TABLET (1,000 MG TOTAL) BY MOUTH 2 (TWO) TIMES DAILY WITH A MEAL. 180 tablet 0  . simvastatin (ZOCOR) 40 MG tablet Take 1 tablet (40 mg total) by mouth at bedtime. 90 tablet 1   No current facility-administered medications on file prior  to visit.    BP 140/80 mmHg  Temp(Src) 98.8 F (37.1 C) (Oral)  Ht 6' (1.829 m)  Wt 195 lb 4.8 oz (88.587 kg)  BMI 26.48 kg/m2       Objective:   Physical Exam  Constitutional: He is oriented to person, place, and time. He appears well-developed and well-nourished. No distress.  Cardiovascular: Normal rate, regular rhythm and intact distal pulses.  Exam reveals no gallop and no friction rub.   Murmur heard. Pulmonary/Chest: Effort normal and breath sounds normal. No respiratory distress. He has no wheezes. He has no rales. He exhibits no tenderness.  Neurological: He is alert and oriented to person, place, and time.  Skin: Skin is warm and dry. No rash noted. He is not  diaphoretic. No erythema. No pallor.  Psychiatric: He has a normal mood and affect. His behavior is normal. Judgment and thought content normal.  Nursing note and vitals reviewed.     Assessment & Plan:  1. Uncontrolled type 2 diabetes mellitus with diabetic neuropathy, with long-term current use of insulin (HCC) - glucose blood test strip; Use as instructed  Dispense: 100 each; Refill: 12 - Insulin Pen Needle (B-D ULTRAFINE III SHORT PEN) 31G X 8 MM MISC; Use 4 times daily as directed  Dispense: 100 each; Refill: 5 - metFORMIN (GLUCOPHAGE) 1000 MG tablet; Take 1 tablet (1,000 mg total) by mouth 2 (two) times daily with a meal.  Dispense: 180 tablet; Refill: 1 - Follow up in 3 months 2. Essential hypertension - amLODipine (NORVASC) 5 MG tablet; Take 1 tablet (5 mg total) by mouth daily.  Dispense: 90 tablet; Refill: 1 - Monitor blood pressure at home. If BP becoming low, go back to 2.5 mg  3. Screen for colon cancer - Ambulatory referral to Gastroenterology  4. Flu shot given

## 2015-04-13 ENCOUNTER — Other Ambulatory Visit: Payer: Self-pay

## 2015-04-13 MED ORDER — SIMVASTATIN 40 MG PO TABS: 40.0000 mg | ORAL_TABLET | Freq: Every day | ORAL | Status: DC

## 2015-04-13 NOTE — Telephone Encounter (Signed)
Rx request for simvastain.  Rx sent to [facility].

## 2015-05-14 ENCOUNTER — Other Ambulatory Visit: Payer: Self-pay | Admitting: Internal Medicine

## 2015-06-17 ENCOUNTER — Other Ambulatory Visit: Payer: Self-pay | Admitting: Internal Medicine

## 2015-06-17 ENCOUNTER — Encounter: Payer: Self-pay | Admitting: Adult Health

## 2015-06-17 DIAGNOSIS — Z1211 Encounter for screening for malignant neoplasm of colon: Secondary | ICD-10-CM

## 2015-06-24 ENCOUNTER — Encounter: Payer: Self-pay | Admitting: Internal Medicine

## 2015-07-23 ENCOUNTER — Ambulatory Visit (INDEPENDENT_AMBULATORY_CARE_PROVIDER_SITE_OTHER): Payer: Medicare Other | Admitting: Cardiovascular Disease

## 2015-07-23 ENCOUNTER — Encounter: Payer: Self-pay | Admitting: Cardiovascular Disease

## 2015-07-23 VITALS — BP 132/78 | HR 70 | Ht 72.0 in | Wt 203.0 lb

## 2015-07-23 DIAGNOSIS — R5383 Other fatigue: Secondary | ICD-10-CM

## 2015-07-23 DIAGNOSIS — I359 Nonrheumatic aortic valve disorder, unspecified: Secondary | ICD-10-CM | POA: Diagnosis not present

## 2015-07-23 DIAGNOSIS — I10 Essential (primary) hypertension: Secondary | ICD-10-CM

## 2015-07-23 DIAGNOSIS — R0789 Other chest pain: Secondary | ICD-10-CM | POA: Diagnosis not present

## 2015-07-23 DIAGNOSIS — E785 Hyperlipidemia, unspecified: Secondary | ICD-10-CM

## 2015-07-23 DIAGNOSIS — I712 Thoracic aortic aneurysm, without rupture, unspecified: Secondary | ICD-10-CM

## 2015-07-23 DIAGNOSIS — I719 Aortic aneurysm of unspecified site, without rupture: Secondary | ICD-10-CM | POA: Insufficient documentation

## 2015-07-23 LAB — COMPREHENSIVE METABOLIC PANEL
ALK PHOS: 72 U/L (ref 40–115)
ALT: 22 U/L (ref 9–46)
AST: 23 U/L (ref 10–35)
Albumin: 4.5 g/dL (ref 3.6–5.1)
BUN: 18 mg/dL (ref 7–25)
CO2: 25 mmol/L (ref 20–31)
CREATININE: 0.98 mg/dL (ref 0.70–1.25)
Calcium: 9.5 mg/dL (ref 8.6–10.3)
Chloride: 100 mmol/L (ref 98–110)
Glucose, Bld: 175 mg/dL — ABNORMAL HIGH (ref 65–99)
POTASSIUM: 4.5 mmol/L (ref 3.5–5.3)
Sodium: 135 mmol/L (ref 135–146)
TOTAL PROTEIN: 7.4 g/dL (ref 6.1–8.1)
Total Bilirubin: 0.4 mg/dL (ref 0.2–1.2)

## 2015-07-23 LAB — CBC WITH DIFFERENTIAL/PLATELET
BASOS PCT: 0 %
Basophils Absolute: 0 cells/uL (ref 0–200)
Eosinophils Absolute: 78 cells/uL (ref 15–500)
Eosinophils Relative: 1 %
HCT: 40 % (ref 38.5–50.0)
HEMOGLOBIN: 13.4 g/dL (ref 13.2–17.1)
Lymphocytes Relative: 32 %
Lymphs Abs: 2496 cells/uL (ref 850–3900)
MCH: 30.5 pg (ref 27.0–33.0)
MCHC: 33.5 g/dL (ref 32.0–36.0)
MCV: 91.1 fL (ref 80.0–100.0)
MPV: 10.2 fL (ref 7.5–12.5)
Monocytes Absolute: 546 cells/uL (ref 200–950)
Monocytes Relative: 7 %
NEUTROS ABS: 4680 {cells}/uL (ref 1500–7800)
NEUTROS PCT: 60 %
Platelets: 243 10*3/uL (ref 140–400)
RBC: 4.39 MIL/uL (ref 4.20–5.80)
RDW: 14 % (ref 11.0–15.0)
WBC: 7.8 10*3/uL (ref 3.8–10.8)

## 2015-07-23 LAB — LIPID PANEL
CHOLESTEROL: 228 mg/dL — AB (ref 125–200)
HDL: 98 mg/dL (ref >=40)
LDL Cholesterol: 115 mg/dL (ref <130)
Total CHOL/HDL Ratio: 2.3 Ratio (ref <=5.0)
Triglycerides: 77 mg/dL (ref <150)
VLDL: 15 mg/dL (ref <30)

## 2015-07-23 LAB — TSH: TSH: 1.86 mIU/L (ref 0.40–4.50)

## 2015-07-23 NOTE — Progress Notes (Signed)
[name] Date of Birth  05-02-1945     [address]    [address]    [place],   [address]        Problems: 1. Chest pain 2. Hypercholesterolemia 3. Diabetes mellitus 4. Mild dilatation of the ascending aorta by CT scan-4.4 cm 5. Coronary calcifications by CT scan 6. Pulmonary nodule 7. Mild aortic stenosis   History of Present Illness:  [name] is a 70 year old gentleman who presents today for further evaluation of his chest pain. These chest pains are described as a left-sided pain. He seemed to come on with rest and exertion.  The pains last for hours.    He has been quite regularly.  He does not do any regular exercise.  He works as a Sports coach.  He had a stress Myoview study which was normal. His echocardiogram revealed  Left ventricle: The cavity size was normal. Wall thickness was increased in a pattern of mild LVH. Systolic function was normal. The estimated ejection fraction was in the range of 55% to 60%. 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 Mean gradient: 45mm Hg (S). Peak gradient: 28mm Hg (S). - Atrial septum: No defect or patent foramen ovale was identified.  He still has some occasional episodes of chest pain that he describes as a "pins and needles" like sensation.  He's able to get out and exercise without any problems.  Dec. 11, 2015:  [name] is a 70 yo who I follow for episodes of chest pain, mild aortic stenosis, mild aortic dilitation He is seen back after a 2 year absence.   He is now retired from being a Media planner,  Now semi retired - works 4 hours a day.   No CP or dyspnea.   Walks occasionally ,  A recent echo showed mild AS and trace MR.  He has mild aortic dilitation.  July 23 2015:  [name] is seen back after a 2 year absence . He's continued to have some episodes of chest pain. We performed a stress Myoview study on him in 2013 which was  normal. These episodes of pain are not associated with eating, drinking, exercise, or change of position. The pains are exacerbated by leaning forward. He's not tried any specific medications. He still works as a Sports coach at [employer] .  He notes that the chest pain will hurt if he sweeping or mopping quite a bit.  He notes some shortness of breath with exertion when he is climbing stairs. This seems to be stable.   Current Outpatient Prescriptions on File Prior to Visit  Medication Sig Dispense Refill  . amLODipine (NORVASC) 5 MG tablet Take 1 tablet (5 mg total) by mouth daily. 90 tablet 1  . aspirin 81 MG tablet Take 81 mg by mouth daily.      [name] Kitchen glucose blood test strip Use as instructed 100 each 12  . Insulin Glargine (LANTUS SOLOSTAR) 100 UNIT/ML Solostar Pen Inject 25 Units into the skin daily at 10 pm. 5 pen 5  . insulin lispro (HUMALOG KWIKPEN) 100 UNIT/ML KiwkPen Use 5 to 10 units three times daily 10-15 minutes before meals 15 mL 5  . Insulin Pen Needle (B-D ULTRAFINE III SHORT PEN) 31G X 8 MM MISC Use 4 times daily as directed 100 each 5  . losartan (COZAAR) 100 MG tablet Take 1 tablet (100 mg total) by mouth daily. [place]  tablet 1  . metFORMIN (GLUCOPHAGE) 1000 MG tablet TAKE 1 TABLET (1,000 MG TOTAL) BY MOUTH 2 (TWO) TIMES DAILY WITH A MEAL. 180 tablet 0  . simvastatin (ZOCOR) 40 MG tablet Take 1 tablet (40 mg total) by mouth at bedtime. 90 tablet 1   No current facility-administered medications on file prior to visit.    No Known Allergies  Past Medical History  Diagnosis Date  . Diabetes mellitus, type 2 ([place])   . Hypertension   . Hyperlipidemia   . Erectile dysfunction   . Aortic stenosis     followed by Dr. Acie Fredrickson  . Carpal tunnel syndrome 09/24/2014    Bilateral    No past surgical history on file.  History  Smoking status  . Never Smoker   Smokeless tobacco  . Never Used    History  Alcohol Use No    Family History  Problem Relation Age of  Onset  . Cancer Father     Throat cancer    Reviw of Systems:  Reviewed in the HPI.  All other systems are negative.  Physical Exam: Blood pressure 132/78, pulse 70, height 6' (1.829 m), weight 203 lb (92.08 kg). General: Well developed, well nourished, in no acute distress.  Head: Normocephalic, atraumatic, sclera non-icteric, mucus membranes are moist,   Neck: Supple. Carotids are 2 + without bruits ( there is radiation of the systolic murmur ) . No JVD  Lungs: Clear bilaterally to auscultation.  Heart: regular rate  There is a 99991111 systolic murmur with radiation up into both carotids .   Slightly blunted radial pulses.   Abdomen: Soft, non-tender, non-distended with normal bowel sounds. No hepatomegaly. No rebound/guarding. No masses.  Msk:  Strength and tone are normal  Extremities: No clubbing or cyanosis. No edema.  Distal pedal pulses are 2+ and equal bilaterally.  Neuro: Alert and oriented X 3. Moves all extremities spontaneously.  Psych:  Responds to questions appropriately with a normal affect.  ECG: July 23, 2015:   NSR at 70.  Normal .   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 Echocardiogram in 2015 showed an ascending aorta diameter of 4.5 cm. His pulses are somewhat diminished. It's quite possible that he's aortic stenosis has worsened slightly.  Will repeat his echocardiogram.  Depending on what the ascending aorta looks like we may need to do a repeat CT scan for further evaluation of his ascending aorta.  We had long discussion about the natural history of a bicuspid aortic valve. I anticipate that he'll need aortic valve surgery-probably in the next 5-10 years. We will certainly be able to narrow that down little bit based on the echo that we will be getting soon   He she continues to stay healthy and exercise. I'll see him again in 6 months for follow-up visit.  2.  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 Following .   3. Hyperlipidemia: He has not had his lipids checked in 2 years. Will check lipids today as well as liver enzymes in the [facility] medical profile.  4. Shortness breath and fatigue. He notes some shortness of breath and fatigue going up stairs. This may be due to his aortic valve. We'll check the above noted labs as well as a CBC and a TSH.  5. Diabetes mellitus: Continue with his current medications. He'll continue to follow-up with Dr. Buddy Duty .  6. HTN:  Stable  Mertie Moores, MD  07/23/2015 8:53 AM    [address] [place],  [place], [place]  [address] Pager [phone] Phone: [phone]; Fax: [phone]

## 2015-07-23 NOTE — Patient Instructions (Addendum)
Medication Instructions:  Same-no changes  Labwork: Today- CMET, CBC, TSH and Lipids  Testing/Procedures: Your physician has requested that you have an echocardiogram. Echocardiography is a painless test that uses sound waves to create images of your heart. It provides your doctor with information about the size and shape of your heart and how well your heart's chambers and valves are working. This procedure takes approximately one hour. There are no restrictions for this procedure.   Follow-Up: Your physician wants you to follow-up in: 6 months. You will receive a reminder letter in the mail two months in advance. If you don't receive a letter, please call our office to schedule the follow-up appointment.      If you need a refill on your cardiac medications before your next appointment, please call your pharmacy.

## 2015-07-27 ENCOUNTER — Ambulatory Visit (AMBULATORY_SURGERY_CENTER): Payer: Self-pay

## 2015-07-27 VITALS — Ht 72.0 in | Wt 201.8 lb

## 2015-07-27 DIAGNOSIS — Z86010 Personal history of colonic polyps: Secondary | ICD-10-CM

## 2015-07-27 MED ORDER — NA SULFATE-K SULFATE-MG SULF 17.5-3.13-1.6 GM/177ML PO SOLN: ORAL | Status: DC

## 2015-07-27 NOTE — Progress Notes (Signed)
Per pt, no allergies to soy or egg products.Pt not taking any weight loss meds or using  O2 at home. 

## 2015-07-28 ENCOUNTER — Encounter: Payer: Self-pay | Admitting: Internal Medicine

## 2015-07-29 ENCOUNTER — Telehealth: Payer: Self-pay | Admitting: Nurse Practitioner

## 2015-07-29 DIAGNOSIS — E785 Hyperlipidemia, unspecified: Secondary | ICD-10-CM

## 2015-07-29 MED ORDER — ATORVASTATIN CALCIUM 40 MG PO TABS: 40.0000 mg | ORAL_TABLET | Freq: Every day | ORAL | Status: DC

## 2015-07-29 NOTE — Telephone Encounter (Signed)
-----   Message from Thayer Headings, MD sent at 07/23/2015  5:45 PM EDT ----- tsh is normal

## 2015-07-29 NOTE — Telephone Encounter (Signed)
Patient aware of lab results and plan of care.  He verbalized understanding and agreement to d/c simvastatin and start atorvastatin 40 mg.  He is scheduled for repeat lab work on 9/26 and is aware to call back with questions or concerns.

## 2015-08-10 ENCOUNTER — Encounter: Payer: Medicare Other | Admitting: Internal Medicine

## 2015-08-10 ENCOUNTER — Telehealth: Payer: Self-pay | Admitting: *Deleted

## 2015-08-10 NOTE — Telephone Encounter (Signed)
Dr Hilarie Fredrickson states that anesthesia has asked that this patient's colonoscopy be rescheduled or cardiac clearance be given from Dr Acie Fredrickson prior to any procedures due to patient's recent left chest pain, both exertional and non exertional. This was apparently just discovered by anesthesia today. Patient is scheduled for echocardiogram on Friday. We will request cardiac clearance after that and we currently have patient scheduled for colonoscopy on 09/02/15 pending cardiology approval. I have spoken to patient regarding the above information. Although disappointed, he is okay doing procedure on 09/02/15. I advised that since he has already completed the prep, we will provide a free prep at the front desk and we will also send update instructions to him. He verbalizes understanding.

## 2015-08-13 ENCOUNTER — Other Ambulatory Visit: Payer: Self-pay

## 2015-08-13 ENCOUNTER — Ambulatory Visit (HOSPITAL_COMMUNITY): Payer: Medicare Other | Attending: Cardiology

## 2015-08-13 DIAGNOSIS — I359 Nonrheumatic aortic valve disorder, unspecified: Secondary | ICD-10-CM | POA: Diagnosis not present

## 2015-08-13 DIAGNOSIS — I119 Hypertensive heart disease without heart failure: Secondary | ICD-10-CM | POA: Insufficient documentation

## 2015-08-13 DIAGNOSIS — E785 Hyperlipidemia, unspecified: Secondary | ICD-10-CM | POA: Diagnosis not present

## 2015-08-13 DIAGNOSIS — E119 Type 2 diabetes mellitus without complications: Secondary | ICD-10-CM | POA: Diagnosis not present

## 2015-08-13 DIAGNOSIS — R9431 Abnormal electrocardiogram [ECG] [EKG]: Secondary | ICD-10-CM | POA: Diagnosis present

## 2015-08-13 DIAGNOSIS — I350 Nonrheumatic aortic (valve) stenosis: Secondary | ICD-10-CM | POA: Insufficient documentation

## 2015-08-13 DIAGNOSIS — I77810 Thoracic aortic ectasia: Secondary | ICD-10-CM | POA: Diagnosis not present

## 2015-08-13 DIAGNOSIS — R0789 Other chest pain: Secondary | ICD-10-CM | POA: Diagnosis not present

## 2015-08-13 LAB — ECHOCARDIOGRAM COMPLETE
AO mean calculated velocity dopler: 225 cm/s
AOVTI: 89.4 cm
AV Area VTI index: 0.42 cm2/m2
AV Area VTI: 0.75 cm2
AV Area mean vel: 0.79 cm2
AV Mean grad: 24 mmHg
AV Peak grad: 44 mmHg
AV VEL mean LVOT/AV: 0.21
AV area mean vel ind: 0.37 cm2/m2
AV vel: 0.89
AVA: 0.89 cm2
AVPKVEL: 331 cm/s
Ao pk vel: 0.2 m/s
Ao-asc: 46 cm
CHL CUP AV PEAK INDEX: 0.35
CHL CUP DOP CALC LVOT VTI: 21 cm
CHL CUP MV DEC (S): 261
EERAT: 10.09
EWDT: 261 ms
FS: 30 % (ref 28–44)
IV/PV OW: 1.03
LA ID, A-P, ES: 38 mm
LA diam end sys: 38 mm
LA diam index: 1.78 cm/m2
LAVOL: 39.5 mL
LAVOLA4C: 28.2 mL
LAVOLIN: 18.5 mL/m2
LV TDI E'LATERAL: 7.4
LVEEAVG: 10.09
LVEEMED: 10.09
LVELAT: 7.4 cm/s
LVOT area: 3.8 cm2
LVOT diameter: 22 mm
LVOT peak vel: 65 cm/s
LVOTSV: 80 mL
LVOTVTI: 0.23 cm
MV pk A vel: 91.2 m/s
MV pk E vel: 74.7 m/s
MVPG: 2 mmHg
PW: 12.9 mm — AB (ref 0.6–1.1)
TAPSE: 15.1 mm
TDI e' medial: 4.79
Valve area index: 0.42

## 2015-08-16 ENCOUNTER — Telehealth: Payer: Self-pay | Admitting: *Deleted

## 2015-08-16 NOTE — Telephone Encounter (Signed)
[name] is at low risk for colonoscopy

## 2015-08-16 NOTE — Telephone Encounter (Signed)
08/16/2015  RE: [name] DOB: Aug 04, 1945 MRN: [id number]  Dear Dr Acie Fredrickson,   We have scheduled the above patient for a colonoscopy procedure.   We would like to request cardiac clearance prior to patient's scheduled procedure on 09/02/15. He is currently scheduled for colonoscopy with propofol sedation at [facility].  Please route clearance to Dixon Boos, CMA.  Sincerely,  Dixon Boos

## 2015-08-16 NOTE — Telephone Encounter (Signed)
Noted and info forwarded to Dr Hilarie Fredrickson.

## 2015-08-20 ENCOUNTER — Telehealth: Payer: Self-pay | Admitting: Nurse Practitioner

## 2015-08-20 DIAGNOSIS — I77810 Thoracic aortic ectasia: Secondary | ICD-10-CM

## 2015-08-20 NOTE — Telephone Encounter (Signed)
-----   Message from Thayer Headings, MD sent at 08/20/2015  2:36 PM EDT ----- Moderate aortic stenosis EF is unchanged from a 2013 myoivew which was normal

## 2015-08-20 NOTE — Telephone Encounter (Signed)
Reviewed results of echo with patient and advised that Dr. Acie Fredrickson would like to order a CT scan for better visualization of the aorta Notes Recorded by Thayer Headings, MD on 08/16/2015 at 4:51 PM CT angio of the aorta to view the aorta - especially the ascending aorta.  Would be beneficial to view the entire aorta  Patient verbalized understanding and agreement and I advised that someone from our office will call him to schedule.

## 2015-08-25 ENCOUNTER — Other Ambulatory Visit: Payer: Self-pay | Admitting: Internal Medicine

## 2015-08-27 ENCOUNTER — Ambulatory Visit (INDEPENDENT_AMBULATORY_CARE_PROVIDER_SITE_OTHER)
Admission: RE | Admit: 2015-08-27 | Discharge: 2015-08-27 | Disposition: A | Discharge status: Home / Self Care | Payer: Medicare Other | Source: Ambulatory Visit | Attending: Cardiovascular Disease | Admitting: Cardiovascular Disease

## 2015-08-27 ENCOUNTER — Other Ambulatory Visit: Payer: Self-pay | Admitting: Internal Medicine

## 2015-08-27 DIAGNOSIS — I77810 Thoracic aortic ectasia: Secondary | ICD-10-CM | POA: Diagnosis not present

## 2015-08-27 MED ORDER — IOPAMIDOL (ISOVUE-370) INJECTION 76%
100.0000 mL | Freq: Once | INTRAVENOUS | Status: AC | PRN
  Administered 2015-08-27: 100 mL via INTRAVENOUS

## 2015-08-27 NOTE — Telephone Encounter (Signed)
Ok to refill 

## 2015-08-27 NOTE — Telephone Encounter (Signed)
Can we refill this, since you saw pt last?

## 2015-08-30 ENCOUNTER — Telehealth: Payer: Self-pay | Admitting: Nurse Practitioner

## 2015-08-30 DIAGNOSIS — I77810 Thoracic aortic ectasia: Secondary | ICD-10-CM

## 2015-08-30 NOTE — Telephone Encounter (Signed)
Attempted to call patient home, work and cell.  Left message for patient to call the office for results.  Referral order has been placed in epic.

## 2015-08-30 NOTE — Telephone Encounter (Signed)
-----   Message from Thayer Headings, MD sent at 08/27/2015  2:24 PM EDT ----- The ascending aortic dilitation appears stable. Please refer to [facility] since the dilitation is moderate in size.

## 2015-08-31 ENCOUNTER — Telehealth: Payer: Self-pay | Admitting: Internal Medicine

## 2015-08-31 ENCOUNTER — Other Ambulatory Visit: Payer: Self-pay | Admitting: Internal Medicine

## 2015-08-31 NOTE — Telephone Encounter (Signed)
See 08-10-15 telephone note. I again instructed patient that prep has been at front desk for his pick up since 08-10-15

## 2015-09-01 NOTE — Telephone Encounter (Signed)
F/u Message ° °Pt returning Rn call. Please call back to discuss  °

## 2015-09-01 NOTE — Telephone Encounter (Signed)
Left message for patient to call back  

## 2015-09-01 NOTE — Telephone Encounter (Signed)
Attempted to call patient again no answer.

## 2015-09-02 ENCOUNTER — Ambulatory Visit (AMBULATORY_SURGERY_CENTER): Payer: Medicare Other | Admitting: Internal Medicine

## 2015-09-02 ENCOUNTER — Encounter: Payer: Self-pay | Admitting: Internal Medicine

## 2015-09-02 VITALS — BP 115/64 | HR 54 | Temp 96.8°F | Resp 11 | Ht 72.0 in | Wt 201.0 lb

## 2015-09-02 DIAGNOSIS — D123 Benign neoplasm of transverse colon: Secondary | ICD-10-CM

## 2015-09-02 DIAGNOSIS — Z86010 Personal history of colonic polyps: Secondary | ICD-10-CM

## 2015-09-02 HISTORY — PX: COLONOSCOPY: SHX174

## 2015-09-02 LAB — GLUCOSE, CAPILLARY
Glucose-Capillary: 161 mg/dL — ABNORMAL HIGH (ref 65–99)
Glucose-Capillary: 138 mg/dL — ABNORMAL HIGH (ref 65–99)

## 2015-09-02 MED ORDER — SODIUM CHLORIDE 0.9 % IV SOLN: 500.0000 mL | INTRAVENOUS | Status: DC

## 2015-09-02 NOTE — Patient Instructions (Signed)
YOU HAD AN ENDOSCOPIC PROCEDURE TODAY AT THE [facility]:   Refer to the procedure report that was given to you for any specific questions about what was found during the examination.  If the procedure report does not answer your questions, please call your gastroenterologist to clarify.  If you requested that your care partner not be given the details of your procedure findings, then the procedure report has been included in a sealed envelope for you to review at your convenience later.  YOU SHOULD EXPECT: Some feelings of bloating in the abdomen. Passage of more gas than usual.  Walking can help get rid of the air that was put into your GI tract during the procedure and reduce the bloating. If you had a lower endoscopy (such as a colonoscopy or flexible sigmoidoscopy) you may notice spotting of blood in your stool or on the toilet paper. If you underwent a bowel prep for your procedure, you may not have a normal bowel movement for a few days.  Please Note:  You might notice some irritation and congestion in your nose or some drainage.  This is from the oxygen used during your procedure.  There is no need for concern and it should clear up in a day or so.  SYMPTOMS TO REPORT IMMEDIATELY:   Following lower endoscopy (colonoscopy or flexible sigmoidoscopy):  Excessive amounts of blood in the stool  Significant tenderness or worsening of abdominal pains  Swelling of the abdomen that is new, acute  Fever of 100F or higher  For urgent or emergent issues, a gastroenterologist can be reached at any hour by calling [phone].   DIET: Your first meal following the procedure should be a small meal and then it is ok to progress to your normal diet. Heavy or fried foods are harder to digest and may make you feel nauseous or bloated.  Likewise, meals heavy in dairy and vegetables can increase bloating.  Drink plenty of fluids but you should avoid alcoholic beverages for 24  hours.  ACTIVITY:  You should plan to take it easy for the rest of today and you should NOT DRIVE or use heavy machinery until tomorrow (because of the sedation medicines used during the test).    FOLLOW UP: Our staff will call the number listed on your records the next business day following your procedure to check on you and address any questions or concerns that you may have regarding the information given to you following your procedure. If we do not reach you, we will leave a message.  However, if you are feeling well and you are not experiencing any problems, there is no need to return our call.  We will assume that you have returned to your regular daily activities without incident.  If any biopsies were taken you will be contacted by phone or by letter within the next 1-3 weeks.  Please call us at [phone] if you have not heard about the biopsies in 3 weeks.    SIGNATURES/CONFIDENTIALITY: You and/or your care partner have signed paperwork which will be entered into your electronic medical record.  These signatures attest to the fact that that the information above on your After Visit Summary has been reviewed and is understood.  Full responsibility of the confidentiality of this discharge information lies with you and/or your care-partner.  Please review polyp, diverticulosis, and high fiber diet handouts provided. 

## 2015-09-02 NOTE — Progress Notes (Signed)
Called to room to assist during endoscopic procedure.  Patient ID and intended procedure confirmed with present staff. Received instructions for my participation in the procedure from the performing physician.  

## 2015-09-02 NOTE — Progress Notes (Signed)
Report to PACU, RN, vss, BBS= Clear.  

## 2015-09-02 NOTE — Telephone Encounter (Signed)
Reviewed results of CT with patient who verbalized understanding and agreement to schedule an appointment with [facility].  He is aware that someone from their office will call him to schedule.

## 2015-09-02 NOTE — Op Note (Signed)
[facility] Patient Name: [name] Procedure Date: 09/02/2015 8:42 AM MRN: [id number] Endoscopist: Jerene Bears , MD Age: 70 Referring MD:  Date of Birth: 02-14-1946 Gender: Male Account #: [id number] Procedure:                Colonoscopy Indications:              High risk colon cancer surveillance: Personal                            history of colonic polyps Medicines:                Monitored Anesthesia Care Procedure:                Pre-Anesthesia Assessment:                           - Prior to the procedure, a History and Physical                            was performed, and patient medications and                            allergies were reviewed. The patient's tolerance of                            previous anesthesia was also reviewed. The risks                            and benefits of the procedure and the sedation                            options and risks were discussed with the patient.                            All questions were answered, and informed consent                            was obtained. Prior Anticoagulants: The patient has                            taken no previous anticoagulant or antiplatelet                            agents. ASA Grade Assessment: II - A patient with                            mild systemic disease. After reviewing the risks                            and benefits, the patient was deemed in                            satisfactory condition to undergo the procedure.  After obtaining informed consent, the colonoscope                            was passed under direct vision. Throughout the                            procedure, the patient's blood pressure, pulse, and                            oxygen saturations were monitored continuously. The                            Model CF-HQ190L [id number]) scope was introduced                            through the anus and advanced to the the  cecum,                            identified by appendiceal orifice and ileocecal                            valve. The colonoscopy was performed without                            difficulty. The patient tolerated the procedure                            well. The quality of the bowel preparation was                            good. The ileocecal valve, appendiceal orifice, and                            rectum were photographed. Scope In: 8:48:26 AM Scope Out: 9:02:47 AM Scope Withdrawal Time: 0 hours 12 minutes 43 seconds  Total Procedure Duration: 0 hours 14 minutes 21 seconds  Findings:                 The digital rectal exam was normal.                           Two sessile polyps were found in the transverse                            colon. The polyps were 2 to 3 mm in size. These                            polyps were removed with a cold snare. Resection                            and retrieval were complete.                           Multiple small and large-mouthed diverticula were  found in the descending colon, transverse colon and                            hepatic flexure.                           The exam was otherwise without abnormality on                            direct and retroflexion views. Complications:            No immediate complications. Estimated Blood Loss:     Estimated blood loss: none. Impression:               - Two 2 to 3 mm polyps in the transverse colon,                            removed with a cold snare. Resected and retrieved.                           - Moderate diverticulosis in the descending colon,                            in the transverse colon and at the hepatic flexure.                           - The examination was otherwise normal on direct                            and retroflexion views. Recommendation:           - Patient has a contact number available for                            emergencies. The  signs and symptoms of potential                            delayed complications were discussed with the                            patient. Return to normal activities tomorrow.                            Written discharge instructions were provided to the                            patient.                           - Resume previous diet.                           - Continue present medications.                           - Await pathology results.                           -  Repeat colonoscopy is recommended. The                            colonoscopy date will be determined after pathology                            results from today's exam become available for                            review. Jerene Bears, MD 09/02/2015 9:13:38 AM This report has been signed electronically.

## 2015-09-02 NOTE — Telephone Encounter (Signed)
F/u  Pt returning RN phone call- CT results. Please call back and discuss.

## 2015-09-03 ENCOUNTER — Telehealth: Payer: Self-pay | Admitting: *Deleted

## 2015-09-03 NOTE — Telephone Encounter (Signed)
  Follow up Call-  Call back number 09/02/2015  Post procedure Call Back phone  # [phone]  Permission to leave phone message Yes     Patient questions:  Do you have a fever, pain , or abdominal swelling? No. Pain Score  0 *  Have you tolerated food without any problems? Yes.    Have you been able to return to your normal activities? Yes.    Do you have any questions about your discharge instructions: Diet   No. Medications  No. Follow up visit  No.  Do you have questions or concerns about your Care? No.  Actions: * If pain score is 4 or above: No action needed, pain <4.

## 2015-09-06 ENCOUNTER — Other Ambulatory Visit: Payer: Self-pay | Admitting: Internal Medicine

## 2015-09-07 ENCOUNTER — Other Ambulatory Visit: Payer: Self-pay | Admitting: Internal Medicine

## 2015-09-07 MED ORDER — METFORMIN HCL 1000 MG PO TABS: 1000.0000 mg | ORAL_TABLET | Freq: Two times a day (BID) | ORAL | 1 refills | Status: DC

## 2015-09-08 ENCOUNTER — Encounter: Payer: Self-pay | Admitting: Internal Medicine

## 2015-09-13 ENCOUNTER — Encounter: Payer: Self-pay | Admitting: Cardiothoracic Surgery

## 2015-09-13 ENCOUNTER — Institutional Professional Consult (permissible substitution) (INDEPENDENT_AMBULATORY_CARE_PROVIDER_SITE_OTHER): Payer: Medicare Other | Admitting: Cardiothoracic Surgery

## 2015-09-13 VITALS — BP 148/86 | HR 86 | Resp 16 | Ht 73.0 in | Wt 203.0 lb

## 2015-09-13 DIAGNOSIS — I712 Thoracic aortic aneurysm, without rupture, unspecified: Secondary | ICD-10-CM

## 2015-09-13 NOTE — Progress Notes (Signed)
I agree with the addition of beta blocker   Will send this note to my nurse also  Will add Coreg 6.25 BID. Decrease Amlodipine to 2.5 a day  Will have him return for a nurse visit / BP visit in 3 weeks

## 2015-09-13 NOTE — Patient Instructions (Addendum)
It's best to avoid activities that cause grunting or straining (medically referred to as a "valsalva maneuver"). This happens when a person bears down against a closed throat to increase the strength of arm or abdominal muscles. There's often a tendency to do this when lifting heavy weights, doing sit-ups, push-ups or chin-ups, etc., but it may be harmful.    Aortic Stenosis Aortic stenosis is a narrowing of the aortic valve. The aortic valve is a gate-like structure that is located between the lower left chamber of the heart (left ventricle) and the blood vessel that leads away from the heart (aorta). When the aortic valve is narrowed, it does not open all the way. This makes it hard for the heart to pump blood into the aorta and causes the heart to work harder. The extra work can weaken the heart over time and lead to heart failure. CAUSES  Causes of aortic stenosis include:  Calcium deposits on the aortic valve that have made the valve stiff. This condition generally affects those over the age of 12. It is the most common cause of aortic stenosis.  A birth defect.  Rheumatic fever. This is a problem that may occur after a strep throat infection that was not treated adequately. Rheumatic fever can cause permanent damage to heart valves. SIGNS AND SYMPTOMS  People with aortic stenosis usually have no symptoms until the condition becomes severe. It may take 10-20 years for mild or moderate aortic stenosis to become severe. Symptoms may include:   Shortness of breath, especially with physical activity.   Feeling weak and tired (fatigued) or getting tired easily.  Chest discomfort (angina). This may occur with minimal activity if the aortic stenosis is severe.  An irregular or faster-than-normal heartbeat.  Dizziness or fainting that happens with exertion or after taking certain heart medicines (such as nitroglycerin). DIAGNOSIS  Aortic stenosis is usually diagnosed with a physical exam  and with a type of imaging test called echocardiography. During echocardiography, sound waves are used to evaluate how blood flows through the heart. If your health care provider suspects aortic stenosis but the test does not clearly show it, a procedure called cardiac catheterization may be done to diagnose the condition. Tests may also be done to evaluate heart function. They may include:  Electrocardiography. During this test, the electrical impulses of the heart are recorded while you are lying down and sticky patches are placed on your chest, arms, and legs.  Stress tests. There is more than one type of stress test. If a stress test is needed, ask your health care provider about which type is best for you.  Blood tests. TREATMENT  Treatment depends on how severe the aortic stenosis is, your symptoms, and the problems it is causing.   Observation. If the aortic stenosis is mild, no treatment may be needed. However, you will need to have the condition checked regularly to make sure it is not getting worse or causing serious problems.  Surgery. Surgery to repair or replace the aortic valve is the most common treatment for aortic stenosis. Several types of surgeries are available. The most common are open-heart surgery and transcutaneous aortic valve replacement (TAVR). TAVR does not require that the chest be opened. It is usually performed on elderly patients and those who are not able to have open-heart surgery.  Medicines. Medicines may be given to keep symptoms from getting worse. Medicines cannot reverse aortic stenosis. HOME CARE INSTRUCTIONS   You may need to avoid certain types of  physical activity. If your aortic stenosis is mild, you may need to avoid only strenuous activity. The more severe your aortic stenosis, the more activities you will need to avoid. Talk with your health care provider about the types of activity you should avoid.  Take medicines only as directed by your health  care provider.  If you are a woman with aortic valve stenosis and want to get pregnant, talk to your health care provider before you become pregnant.  If you are a woman with aortic valve stenosis and are pregnant, keep all follow-up visits with all recommended health care providers.  Keep all follow-up visits for tests, exams, and treatments as directed by your health care provider. SEEK IMMEDIATE MEDICAL CARE IF:  You develop chest pain or tightness.   You develop shortness of breath or difficulty breathing.   You develop light-headedness or faint.   It feels like your heartbeat is irregular or faster than normal.  You have a fever.   This information is not intended to replace advice given to you by your health care provider. Make sure you discuss any questions you have with your health care provider.   Document Released: 10/29/2002 Document Revised: 10/21/2014 Document Reviewed: 01/25/2012 Elsevier Interactive Patient Education 2016 [place].  Thoracic Aortic Aneurysm An aneurysm is a bulge in an artery. It happens when the wall of the artery is weakened or damaged. If the aneurysm gets too big, it bursts (ruptures) and severe bleeding occurs. A thoracic aortic aneurysm is an aneurysm that occurs in the first part of the aorta, between the heart and the diaphragm. The aorta is the main artery and supplies blood from the heart to the rest of the body. A thoracic aortic aneurysm can enlarge and rupture or blood can flow between the layers of the wall of the aorta through a tear (aorticdissection). Both of these conditions can cause bleeding inside the body and can be life threatening unless diagnosed and treated promptly. CAUSES  The exact cause of a thoracic aortic aneurysm is often unknown. Some contributing factors are:   A hardening of the arteries caused by the buildup of fat and other substances in the lining of a blood vessel (arteriosclerosis).  Inflammation of the  walls of an artery (arteritis).  Connective tissue diseases, such as Marfan syndrome.  Injury or trauma to the aorta.  An infection, such as syphilis or staphylococcus, in the wall of the aorta (infectious aortitis) caused by bacteria. RISK FACTORS  Risk factors that contribute to a thoracic aortic aneurysm may include:  Age older than 75 years.  High blood pressure (hypertension).  Male gender.  Ethnicity (white race).  Obesity.  Family history of aneurysm (first degree relatives only).  Tobacco use. PREVENTION  The following healthy lifestyle habits may help decrease your risk of a thoracic aortic aneurysm:  Quitting smoking. Smoking can raise your blood pressure and cause arteriosclerosis.  Limiting or avoiding alcohol.  Keeping your blood pressure, blood sugar level, and cholesterol levels within normal limits.  Decreasing your salt intake. In some people, too much salt can raise blood pressure and increase your risk of abdominal aortic aneurysm.  Eating a diet low in saturated fats and cholesterol.  Increasing your fiber intake by including whole grains, vegetables, and fruits in your diet. Eating these foods may help lower blood pressure.  Maintaining a healthy weight.  Staying physically active and exercising regularly. SYMPTOMS  The symptoms of thoracic aortic aneurysm may vary depending on the size  and rate of growth of the aneurysm. Most grow slowly and do not have any symptoms. When symptoms do occur, they may include:  Pain (chest, back, sides, or abdomen). The pain may vary in intensity. A sudden onset of severe pain may indicate that the aneurysm has ruptured.  Hoarseness.  Cough.  Shortness of breath.  Swallowing problems.  Nausea or vomiting or both. DIAGNOSIS  Since most unruptured thoracic aortic aneurysms have no symptoms, they are often discovered during diagnostic exams for other conditions. An aneurysm may be found during the following  procedures:  Ultrasonography (a one-time screening for thoracic aortic aneurysm by ultrasonography is also recommended for all men aged 48-75 years who have ever smoked).  X-ray exams.  A CT scan.  An MRI.  Angiography or arteriography. TREATMENT  Treatment of a thoracic aortic aneurysm depends on the size of your aneurysm, your age, and risk factors for rupture. Medicine to control blood pressure and pain may be used to manage aneurysms smaller than 2.3 in (6 cm). Regular monitoring for enlargement may be recommended by your health care provider if:  The aneurysm is 1.2-1.5 in (3-4 cm) in size (an annual ultrasonography may be recommended).  The aneurysm is 1.5-1.8 in (4-4.5 cm) in size (an ultrasonography every 6 months may be recommended).  The aneurysm is larger than 1.8 in (4.5 cm) in size (your health care provider may ask that you be examined by a vascular surgeon). If your aneurysm is larger than 2.2 in (5.5 cm) or if it is enlarging quickly, surgical repair may be recommended. There are two main methods for repair of an aneurysm:   Endovascular repair (a minimally invasive surgery).  Open repair. This method is used if an endovascular repair is not possible.   This information is not intended to replace advice given to you by your health care provider. Make sure you discuss any questions you have with your health care provider.   Document Released: 01/30/2005 Document Revised: 11/20/2012 Document Reviewed: 08/12/2012 Elsevier Interactive Patient Education 2016 [place].  Aortic Dissection An aortic dissection is a tear in your aorta. The aorta is the main blood vessel that carries blood out of your heart to supply the rest of your body. It comes out of your heart and curves around, then goes down through your chest (thoracic aorta) and into your belly (abdominal aorta). The wall of the aorta has inner and outer layers. Aortic dissection occurs most often in the thoracic  aorta. This is more likely to happen if the inner layer of the aorta has a weak spot or gets injured. As the dissection widens and blood flows through it, the aorta becomes "double-barreled." This means that one part of the aorta continues to carry blood. However, the inner wall begins to separate from the rest of the aorta as blood flows through the tear. The torn part of the aorta fills with blood. It swells up like a balloon. This can reduce blood flow through the part of the aorta that is still working. Aortic dissection is a medical emergency. CAUSES Aortic dissection happens when there is a tear in the inner wall of the aorta. An injury or weakness can cause this tear. Sometimes the exact cause of the tear is not known. RISK FACTORS You may be at greater risk for aortic dissection if you: Have certain medical conditions, such as uncontrolled high blood pressure or atherosclerosis. Have a blunt injury to your chest. Have a genetic disorder that affects the  connective tissue, such as Marfan syndrome, Turner syndrome, and Ehlers-Danlos syndrome. Are born with a problem that affects either your aorta or your heart valve. Have a condition that causes inflammation of blood vessels, such as giant cell arteritis. Are male. Are older than 68 years of age. Use cocaine. Smoke. Lift very heavy weights or do other types of high-intensity resistance training. SIGNS AND SYMPTOMS  Signs and symptoms of aortic dissection may start suddenly. Changes in position may make symptoms worse. The most common symptoms are: Severe chest pain that may feel like a tearing, stabbing, or sharp pain. Pain that shifts to the shoulder, arm, neck, jaw, abdomen, or hips. Other symptoms may include: Severe abdominal pain. Trouble breathing. Dizziness or fainting. Nausea or vomiting. Trouble swallowing. Sweating a lot. Feeling confused, dazed, anxious, or fearful. DIAGNOSIS Your health care provider may suspect aortic  dissection based on your signs and symptoms and will perform a physical exam. During the physical exam, your health care provider may listen for abnormal blood flow sounds (murmurs) in your chest or your belly. You may also have your blood pressure checked to see whether it is low or whether there is a difference between the measurements in your arms and your legs. You may also have tests such as: Electrocardiogram (ECG). This is a test that measures the electrical activity in your heart. Chest X-ray. CT scan or MRI. Aortic angiogram. This test uses the injection of a dye to make it easier to see your blood vessels clearly. Echocardiogram to study your heart using sound waves. TREATMENT It is important to treat an aortic dissection as quickly as possible. Your treatment may start as soon as your health care provider suspects aortic dissection. Treatment will depend on how severe your dissection is, where it is located, and your overall health. Treatment options include: Medicines to lower your blood pressure. Surgery to remove the dissected part of your aorta and replace it with a graft. Medical procedures to thread long, thin tubes (catheters) into the aorta (endovascular procedures). This may be done to place a graft or a balloon in the blood vessel to improve blood flow or prevent further dissection. HOME CARE INSTRUCTIONS Work with your health care provider to keep your blood pressure under control. Avoid activities that could cause an injury to your chest or your abdomen. Do not smoke. If you need help quitting, ask your health care provider. Do not participate in sports or exercises that involve lifting weights. Keep all follow-up visits as directed by your health care provider. This is important. SEEK MEDICAL CARE IF: You develop any new symptoms of aortic dissection after treatment. SEEK IMMEDIATE MEDICAL CARE IF: You have severe pain in your chest or your abdomen. You have trouble  breathing. These symptoms may represent a serious problem that is an emergency. Do not wait to see if the symptoms will go away. Get medical help right away. Call your local emergency services (911 in the [place] Do not drive yourself to the hospital.   This information is not intended to replace advice given to you by your health care provider. Make sure you discuss any questions you have with your health care provider.   Document Released: 05/09/2007 Document Revised: 02/20/2014 Document Reviewed: 09/10/2013 Elsevier Interactive Patient Education [employer].

## 2015-09-13 NOTE — Progress Notes (Signed)
[address]       [place] [address]             [phone]                    [place] R [facility] [facility] Medical Record [id number] Date of Birth: [birth year]  Referring: [name], [name], MD Primary Care: Drema Pry, DO  Chief Complaint:    Chief Complaint  Patient presents with  . TAA    CTA CHEST 08/27/15, ECHO 08/13/15    History of Present Illness:    [name] 70 y.o. male is seen in the office  today for dilated ascending aorta and moderate aortic stenosis. The patient is followed by Dr. Cathie Olden. He's seen him recently because of vague chest discomfort. Follow-up echocardiogram and CTA of the chest was performed. 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 He is able to do usual activities without symptoms, but does note shortness of breath of the clams a flight of stairs quickly. He had a negative stress test in 2013.  Serial echocardiograms 2013 2015 in 2017- in 2013 a peak aortic pressure was 32 with a mean of 22 and question of a bicuspid valve, and 2015 the patient echo report notes a trileaflet valve peak aortic pressure of 28 and a mean of 18. On the most current echocardiogram the patient has moderate aortic stenosis with a peak pressure of 44 and a mean of 24. Peak aortic valve velocity is 331 cm/s.    Patient does have a family history of aneurysm disease, though the details of this are not clear, he notes that his mother had some previous cardiac surgery and then 5 years later had a ruptured aneurysm and died at age 82. The details of this are unclear. The patient has one brother who died at age 25 of unexplained sudden death.    Current Activity/ Functional Status:  Patient is independent with mobility/ambulation, transfers, ADL's, IADL's.   Zubrod Score: At the time of surgery this patient's most appropriate activity status/level should be described as: [x]     0    Normal  activity, no symptoms []     1    Restricted in physical strenuous activity but ambulatory, able to do out light work []     2    Ambulatory and capable of self care, unable to do work activities, up and about               >50 % of waking hours                              []     3    Only limited self care, in bed greater than 50% of waking hours []     4    Completely disabled, no self care, confined to bed or chair []     5    Moribund   Past Medical History:  Diagnosis Date  . Aortic stenosis    followed by Dr. Acie Fredrickson  . Cancer ([place])   . Carpal tunnel syndrome 09/24/2014   Bilateral  . Diabetes mellitus, type 2 ([place])   . Erectile dysfunction   . Heart murmur   . Hyperlipidemia   . Hypertension     Past Surgical History:  Procedure Laterality Date  . CYST REMOVAL HAND  right hand    Family History  Problem Relation Age of Onset  . Cancer Father     Throat cancer  . Diabetes Sister     Social History   Social History  . Marital status: Married    Spouse name: N/A  . Number of children: N/A  . Years of education: N/A   Occupational History  . Not on file.   Social History Main Topics  . Smoking status: Never Smoker  . Smokeless tobacco: Never Used  . Alcohol use No  . Drug use: No  . Sexual activity: Yes   Other Topics Concern  . Not on file   Social History Narrative   Married to [name] for 25 years   Retired as Chiropodist   5 children   Never Smoked   Alcohol use- no   Regular exercise: walks around the block ea day   Caffeine use: occasional     History  Smoking Status  . Never Smoker  Smokeless Tobacco  . Never Used    History  Alcohol Use No     No Known Allergies  Current Outpatient Prescriptions  Medication Sig Dispense Refill  . amLODipine (NORVASC) 5 MG tablet Take 1 tablet (5 mg total) by mouth daily. 90 tablet 1  . aspirin 81 MG tablet Take 81 mg by mouth daily.      [name] Kitchen atorvastatin (LIPITOR) 40 MG tablet  Take 1 tablet (40 mg total) by mouth daily. 30 tablet 11  . glucose blood test strip Use as instructed 100 each 12  . HUMALOG KWIKPEN 100 UNIT/ML KiwkPen USE 5 TO 10 UNITS THREE TIMES DAILY 10-15 MINUTES BEFORE MEALS 15 mL 1  . Insulin Glargine (LANTUS SOLOSTAR) 100 UNIT/ML Solostar Pen Inject 25 Units into the skin daily at 10 pm. 5 pen 5  . insulin lispro (HUMALOG KWIKPEN) 100 UNIT/ML KiwkPen Use 5 to 10 units three times daily 10-15 minutes before meals 15 mL 5  . Insulin Pen Needle (B-D ULTRAFINE III SHORT PEN) 31G X 8 MM MISC Use 4 times daily as directed 100 each 5  . losartan (COZAAR) 100 MG tablet Take 1 tablet (100 mg total) by mouth daily. 90 tablet 1  . metFORMIN (GLUCOPHAGE) 1000 MG tablet Take 1 tablet (1,000 mg total) by mouth 2 (two) times daily with a meal. 180 tablet 1   No current facility-administered medications for this visit.       Review of Systems:     Cardiac Review of Systems: Y or N  Chest Pain [ y   ]  Resting SOB [ n  ] Exertional SOB  Blue.Reese  ]  Orthopnea Florencio.Farrier  ]   Pedal Edema [ mild  ]    Palpitations [n  ] Syncope  Florencio.Farrier  ]   Presyncope Florencio.Farrier   ]  General Review of Systems: [Y] = yes [  ]=no Constitional: recent weight change [n  ];  Wt loss over the last 3 months [   ] anorexia [  ]; fatigue [  ]; nausea [  ]; night sweats [  ]; fever [  ]; or chills [  ];          Dental: poor dentition[dentures  ]; Last Dentist visit:   Eye : blurred vision [  ]; diplopia [   ]; vision changes [  ];  Amaurosis fugax[  ]; Resp: cough [ n ];  wheezing[n];  hemoptysis[n  ]; shortness of breath[  ];  paroxysmal nocturnal dyspnea[  ]; dyspnea on exertion[  ]; or orthopnea[  ];  GI:  gallstones[  ], vomiting[  ];  dysphagia[  ]; melena[  ];  hematochezia [  ]; heartburn[  ];   Hx of  Colonoscopy[last week   ]; GU: kidney stones [  ]; hematuria[  ];   dysuria [  ];  nocturia[  ];  history of     obstruction [  ]; urinary frequency [  ]             Skin: rash, swelling[  ];, hair loss[  ];   peripheral edema[  ];  or itching[  ]; Musculosketetal: myalgias[  ];  joint swelling[  ];  joint erythema[  ];  joint pain[  ];  back pain[  ];  Heme/Lymph: bruising[  ];  bleeding[  ];  anemia[  ];  Neuro: TIA[  ];  headaches[  ];  stroke[  ];  vertigo[  ];  seizures[  ];   paresthesias[  ];  difficulty walking[  ];  Psych:depression[  ]; anxiety[  ];  Endocrine: diabetes[ y ];  thyroid dysfunction[ n ];  Immunizations: Flu up to date [n  ]; Pneumococcal up to date [ n ];  Other:  Physical Exam: BP (!) 148/86   Pulse 86   Resp 16   Ht 6\' 1"  (1.854 m)   Wt 203 lb (92.1 kg)   SpO2 98% Comment: ON RA  BMI 26.78 kg/m   PHYSICAL EXAMINATION: General appearance: alert, cooperative, appears stated age and no distress Head: Normocephalic, without obvious abnormality, atraumatic Neck: no adenopathy, no carotid bruit, no JVD, supple, symmetrical, trachea midline and thyroid not enlarged, symmetric, no tenderness/mass/nodules Lymph nodes: Cervical, supraclavicular, and axillary nodes normal. Resp: clear to auscultation bilaterally Back: symmetric, no curvature. ROM normal. No CVA tenderness. Cardio: systolic murmur: holosystolic 3/6, crescendo at 2nd right intercostal space GI: soft, non-tender; bowel sounds normal; no masses,  no organomegaly Extremities: extremities normal, atraumatic, no cyanosis or edema and Homans sign is negative, no sign of DVT Neurologic: Grossly normal Palpable DP and PT pulses No stigmata of Marfan's  Diagnostic Studies & Laboratory data:     Recent Radiology Findings:   Ct Angio Chest Aorta W/cm &/or Wo/cm  Result Date: 08/27/2015 CLINICAL DATA:  Followup ascending aortic root dilatation EXAM: CT ANGIOGRAPHY CHEST WITH CONTRAST TECHNIQUE: Multidetector CT imaging of the chest was performed using the standard protocol during bolus administration of intravenous contrast. Multiplanar CT image reconstructions and MIPs were obtained to evaluate the vascular  anatomy. CONTRAST:  100 mL Isovue 370. COMPARISON:  03/31/2011. FINDINGS: Mediastinum/Lymph Nodes: The thoracic inlet is within normal limits. No mediastinal or hilar adenopathy is noted. Cardiovascular: Truncus anomaly of the aorta is noted. Dilatation of the ascending aorta is seen measuring 4.7 cm in greatest dimension at the level of the main pulmonary artery. Given some variation in the measurement technique this is roughly stable from the prior exam. No evidence of dissection is noted. Normal tapering of the aorta in the descending segment is noted. Mild atherosclerotic calcifications are seen. Pulmonary artery shows no definitive pulmonary emboli. Coronary calcifications are seen. Cardiac structures are otherwise within normal limits. Lungs/Pleura: Mild emphysematous changes are noted. Scattered calcified and noncalcified pleural plaques are seen bilaterally. Stable nodule is noted within the right middle lobe similar to that seen on the prior exam. Given its stability it is consistent with a benign etiology. Upper abdomen: Fatty infiltration of the  liver is noted. Musculoskeletal: No chest wall mass or suspicious bone lesions identified. Review of the MIP images confirms the above findings. IMPRESSION: Stable dilatation of the ascending aorta as described. Ascending thoracic aortic aneurysm. Recommend semi-annual imaging followup by CTA or MRA and referral to cardiothoracic surgery if not already obtained. This recommendation follows 2010 ACCF/AHA/AATS/ACR/ASA/SCA/SCAI/SIR/STS/SVM Guidelines for the Diagnosis and Management of Patients With Thoracic Aortic Disease. Circulation. 2010; [id number] Electronically Signed   By: Inez Catalina M.D.   On: 08/27/2015 12:41     I have independently reviewed the above radiologic studies.  ECHO: LV EF: 50% -   55%  ------------------------------------------------------------------- Indications:      Abnormal EKG  (R94.31).  ------------------------------------------------------------------- History:   PMH:   Chest pain.  Murmur.  Aortic valve disease.  Risk factors:  Hypertension. Diabetes mellitus. Dyslipidemia.  ------------------------------------------------------------------- Study Conclusions  - Left ventricle: The cavity size was normal. Wall thickness was   increased in a pattern of mild LVH. Systolic function was normal.   The estimated ejection fraction was in the range of 50% to 55%.   There is hypokinesis of the basalinferolateral and inferior   myocardium. Doppler parameters are consistent with abnormal left   ventricular relaxation (grade 1 diastolic dysfunction). - Aortic valve: Valve mobility was restricted. There was moderate   stenosis. - Aortic root: The aortic root was mildly dilated. - Ascending aorta: The ascending aorta was moderately dilated.  Impressions:  - Basal inferior and inferior lateral hypokinesis with overall   preserved LV function grade 1 diastolic dysfunction; calcified   aortic valve with moderate AS (mean gradient 24 mmHg); moderately   dilated ascending aorta (4.6 cm); suggest CTA or MRA to further   assess.  Transthoracic echocardiography.  M-mode, complete 2D, spectral Doppler, and color Doppler.  Birthdate:  Patient birthdate: March 15, 1945.  Age:  Patient is 70 yr old.  Sex:  Gender: male. BMI: 27.4 kg/m^2.  Blood pressure:     132/70  Patient status: Outpatient.  Study date:  Study date: 08/13/2015. Study time: 01:33 PM.  Location:   Site 3  -------------------------------------------------------------------  ------------------------------------------------------------------- Left ventricle:  The cavity size was normal. Wall thickness was increased in a pattern of mild LVH. Systolic function was normal. The estimated ejection fraction was in the range of 50% to 55%. Regional wall motion abnormalities:   There is hypokinesis  of the basalinferolateral and inferior myocardium. Doppler parameters are consistent with abnormal left ventricular relaxation (grade 1 diastolic dysfunction).  ------------------------------------------------------------------- Aortic valve:   Severely calcified leaflets. Valve mobility was restricted.  Doppler:   There was moderate stenosis.   There was no regurgitation.    VTI ratio of LVOT to aortic valve: 0.23. Indexed valve area (VTI): 0.41 cm^2/m^2. Peak velocity ratio of LVOT to aortic valve: 0.2. Indexed valve area (Vmax): 0.35 cm^2/m^2. Mean velocity ratio of LVOT to aortic valve: 0.21. Indexed valve area (Vmean): 0.36 cm^2/m^2.    Mean gradient (S): 24 mm Hg. Peak gradient (S): 44 mm Hg.  ------------------------------------------------------------------- Aorta:  Aortic root: The aortic root was mildly dilated. Ascending aorta: The ascending aorta was moderately dilated.  ------------------------------------------------------------------- Mitral valve:   Structurally normal valve.   Mobility was not restricted.  Doppler:  Transvalvular velocity was within the normal range. There was no evidence for stenosis. There was trivial regurgitation.    Peak gradient (D): 2 mm Hg.  ------------------------------------------------------------------- Left atrium:  The atrium was normal in size.  ------------------------------------------------------------------- Right ventricle:  The cavity size was  normal. Systolic function was normal.  ------------------------------------------------------------------- Pulmonic valve:    Doppler:  Transvalvular velocity was within the normal range. There was no evidence for stenosis. There was trivial regurgitation.  ------------------------------------------------------------------- Tricuspid valve:   Structurally normal valve.    Doppler: Transvalvular velocity was within the normal range. There was trivial  regurgitation.  ------------------------------------------------------------------- Right atrium:  The atrium was normal in size.  ------------------------------------------------------------------- Pericardium:  There was no pericardial effusion.  ------------------------------------------------------------------- Systemic veins: Inferior vena cava: Diameter: 13.9 mm.  ------------------------------------------------------------------- Measurements   IVC                                       Value          Reference  ID                                        13.9  mm       ---------    Left ventricle                            Value          Reference  LV ID, ED, PLAX chordal                   46.4  mm       43 - 52  LV ID, ES, PLAX chordal                   32.4  mm       23 - 38  LV fx shortening, PLAX chordal            30    %        >=29  LV PW thickness, ED                       12.9  mm       ---------  IVS/LV PW ratio, ED                       1.03           <=1.3  Stroke volume, 2D                         80    ml       ---------  Stroke volume/bsa, 2D                     37    ml/m^2   ---------  LV e&', lateral                            7.4   cm/s     ---------  LV E/e&', lateral                          10.09          ---------  LV e&', medial                             4.79  cm/s     ---------  LV E/e&', medial                           15.59          ---------  LV e&', average                            6.1   cm/s     ---------  LV E/e&', average                          12.26          ---------    Ventricular septum                        Value          Reference  IVS thickness, ED                         13.3  mm       ---------    LVOT                                      Value          Reference  LVOT ID, S                                22    mm       ---------  LVOT area                                 3.8   cm^2     ---------  LVOT peak velocity, S                      65    cm/s     ---------  LVOT mean velocity, S                     46.5  cm/s     ---------  LVOT VTI, S                               21    cm       ---------    Aortic valve                              Value          Reference  Aortic valve peak velocity, S             331   cm/s     ---------  Aortic valve mean velocity, S             225   cm/s     ---------  Aortic valve VTI, S                       89.4  cm       ---------  Aortic mean gradient, S  24    mm Hg    ---------  Aortic peak gradient, S                   44    mm Hg    ---------  VTI ratio, LVOT/AV                        0.23           ---------  Aortic valve area/bsa, VTI                0.41  cm^2/m^2 ---------  Velocity ratio, peak, LVOT/AV             0.2            ---------  Aortic valve area/bsa, peak               0.35  cm^2/m^2 ---------  velocity  Velocity ratio, mean, LVOT/AV             0.21           ---------  Aortic valve area/bsa, mean               0.36  cm^2/m^2 ---------  velocity    Aorta                                     Value          Reference  Aortic root ID, ED                        40    mm       ---------  Ascending aorta ID, A-P, S                46    mm       ---------    Left atrium                               Value          Reference  LA ID, A-P, ES                            38    mm       ---------  LA ID/bsa, A-P                            1.75  cm/m^2   <=2.2  LA volume, S                              39.5  ml       ---------  LA volume/bsa, S                          18.2  ml/m^2   ---------  LA volume, ES, 1-p A4C                    28.2  ml       ---------  LA volume/bsa, ES, 1-p A4C  13    ml/m^2   ---------  LA volume, ES, 1-p A2C                    46    ml       ---------  LA volume/bsa, ES, 1-p A2C                21.2  ml/m^2   ---------    Mitral valve                              Value          Reference  Mitral  E-wave peak velocity               74.7  cm/s     ---------  Mitral A-wave peak velocity               91.2  cm/s     ---------  Mitral deceleration time          (H)     261   ms       150 - 230  Mitral peak gradient, D                   2     mm Hg    ---------  Mitral E/A ratio, peak                    0.8            ---------    Right ventricle                           Value          Reference  TAPSE                                     15.1  mm       ---------  RV s&', lateral, S                         12    cm/s     ---------  Legend: (L)  and  (H)  mark values outside specified reference range.  ------------------------------------------------------------------- Prepared and Electronically Authenticated by  Kirk Ruths 2017-06-30T16:24:03    Recent Lab Findings: Lab Results  Component Value Date   WBC 7.8 07/23/2015   HGB 13.4 07/23/2015   HCT 40.0 07/23/2015   PLT 243 07/23/2015   GLUCOSE 175 (H) 07/23/2015   CHOL 228 (H) 07/23/2015   TRIG 77 07/23/2015   HDL 98 07/23/2015   LDLDIRECT 112.6 05/14/2012   LDLCALC 115 07/23/2015   ALT 22 07/23/2015   AST 23 07/23/2015   NA 135 07/23/2015   K 4.5 07/23/2015   CL 100 07/23/2015   CREATININE 0.98 07/23/2015   BUN 18 07/23/2015   CO2 25 07/23/2015   TSH 1.86 07/23/2015   HGBA1C 7.2 (H) 12/22/2014   Aortic Size Index=         /Body surface area is 2.18 meters squared. = 2.15  < 2.75 cm/m2      4% risk per year 2.75 to 4.25          8% risk per year > 4.25 cm/m2  20% risk per year     Assessment / Plan:   1/ Patient with moderate dilatation of the ascending aorta 4.7 cm stable since CT scan of 2013 2/ progressive worsening of aortic stenosis, mild in 2013 now moderate but with minimal symptoms. 3/ possible bicuspid aortic valve   4/ history of hypertension  I reviewed in detail with the patient and his family the significance of the dilated ascending aorta, reviewed the risks and recommendations for  elective replacement of the ascending aorta. The patient needs to be closely followed both for his moderate aortic stenosis and dilated descending aorta.  Plan to repeat his CT of the chest in 6 months  It may be that the patient will require aortic valve replacement in the future, and with the current size of his aorta, aorta would be replaced at the same time.  I  spent 40 minutes counseling the patient face to face and 50% or more the  time was spent in counseling and coordination of care. The total time spent in the appointment was 60 minutes.  [name] MD      [address] [place] [address] Office [phone]   Beeper [phone]  09/13/2015 4:16 PM

## 2015-09-14 ENCOUNTER — Telehealth: Payer: Self-pay | Admitting: *Deleted

## 2015-09-14 MED ORDER — CARVEDILOL 6.25 MG PO TABS: 6.2500 mg | ORAL_TABLET | Freq: Two times a day (BID) | ORAL | 3 refills | Status: DC

## 2015-09-14 MED ORDER — AMLODIPINE BESYLATE 2.5 MG PO TABS: 2.5000 mg | ORAL_TABLET | Freq: Every day | ORAL | 3 refills | Status: DC

## 2015-09-14 NOTE — Telephone Encounter (Signed)
-----   Message from Thayer Headings, MD sent at 09/13/2015  6:23 PM EDT -----   ----- Message ----- From: [name], MD Sent: 09/13/2015   5:35 PM To: Rosine Abe, DO, Thayer Headings, MD  Please see this recent information about our joint patient. Phil, ? Need for beta blocker   Edward B.  Gerhardt MD [facility] [address]  [place], [address]        [phone] office        [phone] beeper

## 2015-09-14 NOTE — Telephone Encounter (Signed)
Progress Notes  Thayer Headings, MD (Physician) . [name]    I agree with the addition of beta blocker   Will send this note to my nurse also  Will add Coreg 6.25 BID. Decrease Amlodipine to 2.5 a day  Will have him return for a nurse visit / BP visit in 3 weeks      Spoke with pt and went over new orders per Dr. Acie Fredrickson.  Verified pharmacy.  Scheduled pt for nurse visit on 8/28.  Advised pt if he has any issues with med changes to please go ahead and call our office and make [place] aware.  Pt verbalized understanding and was in agreement with this plan.

## 2015-10-09 ENCOUNTER — Other Ambulatory Visit: Payer: Self-pay | Admitting: Internal Medicine

## 2015-10-11 ENCOUNTER — Ambulatory Visit (INDEPENDENT_AMBULATORY_CARE_PROVIDER_SITE_OTHER): Payer: Medicare Other

## 2015-10-11 VITALS — BP 138/80 | HR 80 | Ht 72.0 in | Wt 202.0 lb

## 2015-10-11 DIAGNOSIS — R0789 Other chest pain: Secondary | ICD-10-CM | POA: Diagnosis not present

## 2015-10-11 MED ORDER — FAMOTIDINE 10 MG PO CHEW: 10.0000 mg | CHEWABLE_TABLET | Freq: Two times a day (BID) | ORAL | Status: DC | PRN

## 2015-10-11 MED ORDER — LOSARTAN POTASSIUM 100 MG PO TABS: 100.0000 mg | ORAL_TABLET | Freq: Every day | ORAL | 3 refills | Status: DC

## 2015-10-11 NOTE — Patient Instructions (Signed)
Medication Instructions:  Your physician has recommended you make the following change in your medication:  1-START Pepcid 10 mg chewable tablets my mouth twice daily as needed for heart burn.  Labwork: NONE  Testing/Procedures: Your physician has requested that you have a lexiscan myoview. For further information please visit [email or web address]. Please follow instruction sheet, as given.  Follow-Up: Your physician wants you to follow-up with Dr. Acie Fredrickson as planned.  If you need a refill on your cardiac medications before your next appointment, please call your pharmacy.

## 2015-10-11 NOTE — Progress Notes (Signed)
  1.) Reason for visit: BP check  2.) Name of MD requesting visit: Dr. Acie Fredrickson  3.) H&P: Patient has a history of HTN, DM, ED, aortic stenosis, dilated aortic root, and hyperlipidemia. Patient's BP 138/80 and HR 80. Patient stated he has been having heart burn 2 to 3 times a day for two weeks. Patient denies SOB.  4.) ROS related to problem: Patient's BP today 138/80, HR 80. Patient complained of heart burn today. Patient stated his pain is in his chest and if feels like gas building up. Patient states he does not eat spicy foods, and this heart burn comes on different times of the day. Patient states he usually has not eaten when this heart burn comes on. Patient stated it does not last long, but has been happening 2 to 3 times a day for about two weeks.  5.) Assessment and plan per MD: Dr. Acie Fredrickson wanted patient to continue his current BP medications. Per Dr. Acie Fredrickson, patient will have a lexiscan test due to his chest pain. Patient will also start taking Pepcid as needed for heart burn to see if symptoms improve.

## 2015-10-19 ENCOUNTER — Telehealth (HOSPITAL_COMMUNITY): Payer: Self-pay | Admitting: *Deleted

## 2015-10-19 NOTE — Telephone Encounter (Signed)
Attempted to call patient regarding upcoming nuclear Appointment- no answer x2.  [name]

## 2015-10-20 ENCOUNTER — Ambulatory Visit (HOSPITAL_COMMUNITY): Payer: Medicare Other | Attending: Internal Medicine

## 2015-10-20 DIAGNOSIS — R0789 Other chest pain: Secondary | ICD-10-CM | POA: Insufficient documentation

## 2015-10-20 DIAGNOSIS — I10 Essential (primary) hypertension: Secondary | ICD-10-CM | POA: Insufficient documentation

## 2015-10-20 DIAGNOSIS — R9439 Abnormal result of other cardiovascular function study: Secondary | ICD-10-CM | POA: Insufficient documentation

## 2015-10-20 LAB — MYOCARDIAL PERFUSION IMAGING
CHL CUP NUCLEAR SSS: 2
CHL CUP RESTING HR STRESS: 49 {beats}/min
LHR: 0.31
LVDIAVOL: 139 mL (ref 62–150)
LVSYSVOL: 76 mL
NUC STRESS TID: 1.07
Peak HR: 76 {beats}/min
SDS: 1
SRS: 1

## 2015-10-20 MED ORDER — TECHNETIUM TC 99M TETROFOSMIN IV KIT
32.7000 | PACK | Freq: Once | INTRAVENOUS | Status: AC | PRN
  Administered 2015-10-20: 32.7 via INTRAVENOUS
  Filled 2015-10-20: qty 33

## 2015-10-20 MED ORDER — REGADENOSON 0.4 MG/5ML IV SOLN
0.4000 mg | Freq: Once | INTRAVENOUS | Status: AC
  Administered 2015-10-20: 0.4 mg via INTRAVENOUS

## 2015-10-20 MED ORDER — TECHNETIUM TC 99M TETROFOSMIN IV KIT
10.2000 | PACK | Freq: Once | INTRAVENOUS | Status: AC | PRN
  Administered 2015-10-20: 10 via INTRAVENOUS
  Filled 2015-10-20: qty 10

## 2015-10-21 ENCOUNTER — Telehealth: Payer: Self-pay | Admitting: Cardiovascular Disease

## 2015-10-21 NOTE — Telephone Encounter (Signed)
New message ° ° ° ° °Returning a call to the nurse °

## 2015-10-21 NOTE — Telephone Encounter (Signed)
Reviewed results of abnormal myocardial perfusion study with patient.  I advised that per Dr. Acie Fredrickson, he will need to come in for an appointment to discuss possible cardiac cath.  I scheduled him with Richardson Dopp, PA on Monday Sept. 11 at 11:45 am.  He verbalized understanding and agreement with plan.

## 2015-10-24 NOTE — Progress Notes (Signed)
Cardiology Office Note:    Date:  10/25/2015   ID:  [name], DOB 1945-09-24, MRN [id number]  PCP:  Brandon Pry, DO  Cardiologist:  Dr. Liam Mathis   Electrophysiologist:  [name]  Referring MD: Brandon Abe, DO   Chief Complaint  Patient presents with  . Follow-up    discuss cath    History of Present Illness:    [name] is a 70 y.o. male with a hx of DM, HTN, HL, aortic stenosis, ascending aorta aneurysm.  He was seen by Dr. Liam Mathis in 6/17 and a FU echo demonstrated Moderately dilated ascending aorta. Patient was referred to Dr. Servando Mathis for consultation. Recommendation is to follow his aneurysm with serial CT scans. Recommendation was also to place the patient on a beta blocker. When he returned for follow-up blood pressure check, he noted chest discomfort to the nurse and was set up for a nuclear stress test. This was done last week and demonstrated reduced LV function with an EF of 45% and an apical lateral defect. This was an intermediate risk study. Dr. Acie Mathis reviewed the study and recommended proceeding with cardiac catheterization. Patient presents today for further evaluation. He is here today with his wife. He continues to note left-sided chest discomfort. He describes this as pins and needles. He notes discomfort with positional changes. He also notes exertional chest discomfort. He does note associated shortness of breath but no other associated symptoms. He notes dyspnea with moderate activities. He denies syncope. He denies orthopnea or pedal edema. He does have chronic left shoulder plain pain. He denies bleeding.   Prior CV studies that were reviewed today include:    Nuc Stress Test 10/20/15 Nuclear stress EF: 45%.  Defect 1: There is a small defect of mild severity present in the apical lateral location. No ischemia noted.  This is an intermediate risk study due to reduced LVF.  Chest CTA 7/17 IMPRESSION: Stable dilatation of the ascending aorta as  described. 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 Recommend semi-annual imaging followup by CTA or MRA and referral to cardiothoracic surgery if not already obtained. This recommendation follows 2010 ACCF/AHA/AATS/ACR/ASA/SCA/SCAI/SIR/STS/SVM Guidelines for the Diagnosis and Management of Patients With Thoracic Aortic Disease. Circulation. 2010; 121: [name]  Echo 08/13/15 Mild LVH, EF 50-55%, inferolateral and inferior HK, grade 1 diastolic dysfunction, moderate aortic stenosis (mean 24 mmHg), mildly dilated aortic root, moderately dilated ascending aorta (4.6 cm)   Past Medical History:  Diagnosis Date  . Aortic stenosis    followed by Dr. Acie Mathis  . Cancer ([place])   . Carpal tunnel syndrome 09/24/2014   Bilateral  . Diabetes mellitus, type 2 ([place])   . Erectile dysfunction   . Heart murmur   . Hyperlipidemia   . Hypertension     Past Surgical History:  Procedure Laterality Date  . CYST REMOVAL HAND     right hand    Current Medications: Outpatient Medications Prior to Visit  Medication Sig Dispense Refill  . amLODipine (NORVASC) 2.5 MG tablet Take 1 tablet (2.5 mg total) by mouth daily. 90 tablet 3  . aspirin 81 MG tablet Take 81 mg by mouth daily.      [name] Kitchen atorvastatin (LIPITOR) 40 MG tablet Take 1 tablet (40 mg total) by mouth daily. 30 tablet 11  . carvedilol (COREG) 6.25 MG tablet Take 1 tablet (6.25 mg total) by mouth 2 (two) times daily. 180 tablet 3  . famotidine (PEPCID AC) 10 MG chewable tablet Chew 1 tablet (  10 mg total) by mouth 2 (two) times daily as needed for heartburn.    [name] Kitchen glucose blood test strip Use as instructed 100 each 12  . HUMALOG KWIKPEN 100 UNIT/ML KiwkPen USE 5 TO 10 UNITS THREE TIMES DAILY 10-15 MINUTES BEFORE MEALS 15 mL 1  . Insulin Glargine (LANTUS SOLOSTAR) 100 UNIT/ML Solostar Pen Inject 25 Units into the skin daily at 10 pm. 5 pen 5  . insulin lispro (HUMALOG KWIKPEN) 100 UNIT/ML KiwkPen Use 5 to 10 units three times daily 10-15 minutes before  meals 15 mL 5  . losartan (COZAAR) 100 MG tablet Take 1 tablet (100 mg total) by mouth daily. 90 tablet 3  . metFORMIN (GLUCOPHAGE) 1000 MG tablet Take 1 tablet (1,000 mg total) by mouth 2 (two) times daily with a meal. 180 tablet 1  . Insulin Pen Needle (B-D ULTRAFINE III SHORT PEN) 31G X 8 MM MISC Use 4 times daily as directed 100 each 5   No facility-administered medications prior to visit.       Allergies:   Review of patient's allergies indicates no known allergies.   Social History   Social History  . Marital status: Married    Spouse name: [name]  . Number of children: [name]  . Years of education: [name]   Social History Main Topics  . Smoking status: Never Smoker  . Smokeless tobacco: Never Used  . Alcohol use No  . Drug use: No  . Sexual activity: Yes   Other Topics Concern  . None   Social History Narrative   Married to [name] for 53 years   Retired as Chiropodist   5 children   Never Smoked   Alcohol use- no   Regular exercise: walks around the block ea day   Caffeine use: occasional      Family History:  The patient's family history includes Cancer in his father; Diabetes in his sister.   ROS:   Please see the history of present illness.    Review of Systems  Cardiovascular: Positive for chest pain and dyspnea on exertion.  Respiratory: Positive for shortness of breath.   Hematologic/Lymphatic: Bruises/bleeds easily.   All other systems reviewed and are negative.   EKGs/Labs/Other Test Reviewed:    EKG:  EKG is  ordered today.  The ekg ordered today demonstrates Sinus bradycardia, HR 55, LAD, nonspecific ST-T wave changes, QTc 407 ms, no change since prior tracing dated 07/23/15  Recent Labs: 07/23/2015: ALT 22; Hemoglobin 13.4; Platelets 243; TSH 1.86 10/25/2015: BUN 12; Creat 1.06; Potassium 5.0; Sodium 136   Recent Lipid Panel    Component Value Date/Time   CHOL 228 (H) 07/23/2015 0921   TRIG 77 07/23/2015 0921   HDL 98 07/23/2015 0921     CHOLHDL 2.3 07/23/2015 0921   VLDL 15 07/23/2015 0921   LDLCALC 115 07/23/2015 0921   LDLDIRECT 112.6 05/14/2012 1031     Physical Exam:    VS:  BP 140/90   Pulse (!) 55   Ht 6' (1.829 m)   Wt 203 lb 12.8 oz (92.4 kg)   BMI 27.64 kg/m     Wt Readings from Last 3 Encounters:  10/25/15 203 lb 12.8 oz (92.4 kg)  10/20/15 202 lb (91.6 kg)  10/11/15 202 lb (91.6 kg)     Physical Exam  Constitutional: He is oriented to person, place, and time. He appears well-developed and well-nourished. No distress.  HENT:  Head: Normocephalic and atraumatic.  Eyes: No scleral icterus.  Neck: Normal range of motion. No JVD present.  Cardiovascular: Normal rate, regular rhythm, S1 normal and S2 normal.   Murmur heard.  Harsh crescendo-decrescendo systolic murmur is present with a grade of 2/6  at the upper right sternal border Pulmonary/Chest: Effort normal and breath sounds normal. He has no wheezes. He has no rhonchi. He has no rales.  Abdominal: Soft. There is no tenderness.  Musculoskeletal: He exhibits no edema.  Neurological: He is alert and oriented to person, place, and time.  Skin: Skin is warm and dry.  Psychiatric: He has a normal mood and affect.    ASSESSMENT:    1. Other chest pain   2. Ascending aorta dilatation (HCC)   3. Essential hypertension   4. Hyperlipidemia   5. Aortic valve disease    PLAN:    In order of problems listed above:  1. Chest pain - He has somewhat atypical symptoms. However, his recent nuclear stress test is abnormal. Recommendation is to proceed with cardiac catheterization.  Risks and benefits of cardiac catheterization have been discussed with the patient.  These include bleeding, infection, kidney damage, stroke, heart attack, death.  The patient understands these risks and is willing to proceed.   2. Ascending aortic aneurysm - Continue follow-up with Dr. Servando Mathis.  3. HTN - Blood pressure borderline elevated today. He has not taken any  medications yet. Continue to monitor. Consider increasing amlodipine if blood pressure remains elevated.  4. HL - Continue atorvastatin.  5. Aortic stenosis - Moderate aortic stenosis by recent echocardiogram.   Medication Adjustments/Labs and Tests Ordered: Current medicines are reviewed at length with the patient today.  Concerns regarding medicines are outlined above.  Medication changes, Labs and Tests ordered today are outlined in the Patient Instructions noted below. Patient Instructions  Medication Instructions:  Your physician recommends that you continue on your current medications as directed. Please refer to the Current Medication list given to you today.  Labwork: TODAY BMET, CBC W/DIFF, PT/INR  Testing/Procedures: Your physician has requested that you have a cardiac catheterization TO BE DONE ON 10/27/15 @ 1 PM. Cardiac catheterization is used to diagnose and/or treat various heart conditions. Doctors may recommend this procedure for a number of different reasons. The most common reason is to evaluate chest pain. Chest pain can be a symptom of coronary artery disease (CAD), and cardiac catheterization can show whether plaque is narrowing or blocking your heart's arteries. This procedure is also used to evaluate the valves, as well as measure the blood flow and oxygen levels in different parts of your heart. For further information please visit [facility]tn. Please follow instruction sheet, as given.  Follow-Up: 11/17/15 @ 10:15 WITH Okey Zelek, PAC ; POST CATH FOLLOW UP  Any Other Special Instructions Will Be Listed Below (If Applicable). If you need a refill on your cardiac medications before your next appointment, please call your pharmacy.  Signed, Richardson Dopp, PA-C  10/25/2015 5:28 PM    [facility] [place], [place],   [address] Phone: [phone]; Fax: [phone]    Attending Note:   The patient was seen and  examined.  Agree with assessment and plan as noted above.  Changes made to the above note as needed.  Patient seen and independently examined with Richardson Dopp, PA .   We discussed all aspects of the encounter. I agree with the assessment and plan as stated above.  Agree with plans for cath . The myoview shows ischemia  and reduced LV function.    I have spent a total of 40 minutes with patient reviewing hospital  notes , telemetry, EKGs, labs and examining patient as well as establishing an assessment and plan that was discussed with the patient. > 50% of time was spent in direct patient care.    Thayer Headings, Brooke Bonito., MD, [facility] 10/28/2015, 12:29 PM [address],  [place] Pager [phone]

## 2015-10-25 ENCOUNTER — Encounter: Payer: Self-pay | Admitting: *Deleted

## 2015-10-25 ENCOUNTER — Encounter: Payer: Self-pay | Admitting: Physician Assistant

## 2015-10-25 ENCOUNTER — Ambulatory Visit (INDEPENDENT_AMBULATORY_CARE_PROVIDER_SITE_OTHER): Payer: Medicare Other | Admitting: Physician Assistant

## 2015-10-25 VITALS — BP 140/90 | HR 55 | Ht 72.0 in | Wt 203.8 lb

## 2015-10-25 DIAGNOSIS — E785 Hyperlipidemia, unspecified: Secondary | ICD-10-CM | POA: Diagnosis not present

## 2015-10-25 DIAGNOSIS — R0789 Other chest pain: Secondary | ICD-10-CM

## 2015-10-25 DIAGNOSIS — I10 Essential (primary) hypertension: Secondary | ICD-10-CM

## 2015-10-25 DIAGNOSIS — I77810 Thoracic aortic ectasia: Secondary | ICD-10-CM

## 2015-10-25 DIAGNOSIS — I359 Nonrheumatic aortic valve disorder, unspecified: Secondary | ICD-10-CM

## 2015-10-25 LAB — BASIC METABOLIC PANEL
BUN: 12 mg/dL (ref 7–25)
CHLORIDE: 100 mmol/L (ref 98–110)
CO2: 29 mmol/L (ref 20–31)
Calcium: 9.9 mg/dL (ref 8.6–10.3)
Creat: 1.06 mg/dL (ref 0.70–1.25)
Glucose, Bld: 286 mg/dL — ABNORMAL HIGH (ref 65–99)
POTASSIUM: 5 mmol/L (ref 3.5–5.3)
SODIUM: 136 mmol/L (ref 135–146)

## 2015-10-25 LAB — CBC WITH DIFFERENTIAL/PLATELET
BASOS PCT: 0 %
Basophils Absolute: 0 cells/uL (ref 0–200)
EOS ABS: 164 {cells}/uL (ref 15–500)
Eosinophils Relative: 2 %
HEMATOCRIT: 40.2 % (ref 38.5–50.0)
Hemoglobin: 13.6 g/dL (ref 13.2–17.1)
Lymphocytes Relative: 40 %
Lymphs Abs: 3280 cells/uL (ref 850–3900)
MCH: 30.5 pg (ref 27.0–33.0)
MCHC: 33.8 g/dL (ref 32.0–36.0)
MCV: 90.1 fL (ref 80.0–100.0)
MONO ABS: 574 {cells}/uL (ref 200–950)
MPV: 10.9 fL (ref 7.5–12.5)
Monocytes Relative: 7 %
NEUTROS ABS: 4182 {cells}/uL (ref 1500–7800)
Neutrophils Relative %: 51 %
PLATELETS: 255 10*3/uL (ref 140–400)
RBC: 4.46 MIL/uL (ref 4.20–5.80)
RDW: 13.4 % (ref 11.0–15.0)
WBC: 8.2 10*3/uL (ref 3.8–10.8)

## 2015-10-25 LAB — PROTIME-INR
INR: 1.1
Prothrombin Time: 11.2 s (ref 9.0–11.5)

## 2015-10-25 NOTE — Patient Instructions (Addendum)
Medication Instructions:  Your physician recommends that you continue on your current medications as directed. Please refer to the Current Medication list given to you today.  Labwork: TODAY BMET, CBC W/DIFF, PT/INR  Testing/Procedures: Your physician has requested that you have a cardiac catheterization TO BE DONE ON 10/27/15 @ 1 PM. Cardiac catheterization is used to diagnose and/or treat various heart conditions. Doctors may recommend this procedure for a number of different reasons. The most common reason is to evaluate chest pain. Chest pain can be a symptom of coronary artery disease (CAD), and cardiac catheterization can show whether plaque is narrowing or blocking your heart's arteries. This procedure is also used to evaluate the valves, as well as measure the blood flow and oxygen levels in different parts of your heart. For further information please visit [facility]iest[facility]tn. Please follow instruction sheet, as given.  Follow-Up: 11/17/15 @ 10:15 WITH SCOTT WEAVER, PAC ; POST CATH FOLLOW UP  Any Other Special Instructions Will Be Listed Below (If Applicable). If you need a refill on your cardiac medications before your next appointment, please call your pharmacy.

## 2015-10-26 ENCOUNTER — Telehealth: Payer: Self-pay | Admitting: *Deleted

## 2015-10-26 NOTE — Telephone Encounter (Signed)
Pt has been notified of lab results  By phone with verbal understanding.

## 2015-10-27 ENCOUNTER — Encounter (HOSPITAL_COMMUNITY): Payer: Self-pay | Admitting: Cardiology

## 2015-10-27 ENCOUNTER — Other Ambulatory Visit: Payer: Self-pay | Admitting: *Deleted

## 2015-10-27 ENCOUNTER — Inpatient Hospital Stay (HOSPITAL_COMMUNITY)
Admission: AD | Admit: 2015-10-27 | Discharge: 2015-11-08 | DRG: 217 | Disposition: A | Discharge status: Home Health | Payer: Medicare Other | Source: Ambulatory Visit | Attending: Cardiothoracic Surgery | Admitting: Cardiothoracic Surgery

## 2015-10-27 ENCOUNTER — Encounter (HOSPITAL_COMMUNITY)
Admission: AD | Disposition: A | Discharge status: Home Health | Payer: Self-pay | Source: Ambulatory Visit | Attending: Cardiothoracic Surgery

## 2015-10-27 DIAGNOSIS — Z8249 Family history of ischemic heart disease and other diseases of the circulatory system: Secondary | ICD-10-CM | POA: Diagnosis not present

## 2015-10-27 DIAGNOSIS — D62 Acute posthemorrhagic anemia: Secondary | ICD-10-CM | POA: Diagnosis not present

## 2015-10-27 DIAGNOSIS — I2510 Atherosclerotic heart disease of native coronary artery without angina pectoris: Secondary | ICD-10-CM

## 2015-10-27 DIAGNOSIS — E785 Hyperlipidemia, unspecified: Secondary | ICD-10-CM | POA: Diagnosis present

## 2015-10-27 DIAGNOSIS — R9439 Abnormal result of other cardiovascular function study: Secondary | ICD-10-CM | POA: Diagnosis present

## 2015-10-27 DIAGNOSIS — Z794 Long term (current) use of insulin: Secondary | ICD-10-CM

## 2015-10-27 DIAGNOSIS — I350 Nonrheumatic aortic (valve) stenosis: Secondary | ICD-10-CM | POA: Diagnosis not present

## 2015-10-27 DIAGNOSIS — I719 Aortic aneurysm of unspecified site, without rupture: Secondary | ICD-10-CM | POA: Diagnosis present

## 2015-10-27 DIAGNOSIS — E8770 Fluid overload, unspecified: Secondary | ICD-10-CM | POA: Diagnosis not present

## 2015-10-27 DIAGNOSIS — I480 Paroxysmal atrial fibrillation: Secondary | ICD-10-CM | POA: Diagnosis not present

## 2015-10-27 DIAGNOSIS — I714 Abdominal aortic aneurysm, without rupture: Secondary | ICD-10-CM | POA: Diagnosis not present

## 2015-10-27 DIAGNOSIS — I352 Nonrheumatic aortic (valve) stenosis with insufficiency: Secondary | ICD-10-CM | POA: Diagnosis present

## 2015-10-27 DIAGNOSIS — Z951 Presence of aortocoronary bypass graft: Secondary | ICD-10-CM

## 2015-10-27 DIAGNOSIS — I25118 Atherosclerotic heart disease of native coronary artery with other forms of angina pectoris: Principal | ICD-10-CM | POA: Diagnosis present

## 2015-10-27 DIAGNOSIS — E1151 Type 2 diabetes mellitus with diabetic peripheral angiopathy without gangrene: Secondary | ICD-10-CM | POA: Diagnosis present

## 2015-10-27 DIAGNOSIS — I10 Essential (primary) hypertension: Secondary | ICD-10-CM | POA: Diagnosis present

## 2015-10-27 DIAGNOSIS — E1165 Type 2 diabetes mellitus with hyperglycemia: Secondary | ICD-10-CM | POA: Diagnosis present

## 2015-10-27 DIAGNOSIS — Z23 Encounter for immunization: Secondary | ICD-10-CM

## 2015-10-27 DIAGNOSIS — I25119 Atherosclerotic heart disease of native coronary artery with unspecified angina pectoris: Secondary | ICD-10-CM | POA: Diagnosis not present

## 2015-10-27 DIAGNOSIS — R0902 Hypoxemia: Secondary | ICD-10-CM | POA: Diagnosis not present

## 2015-10-27 DIAGNOSIS — I208 Other forms of angina pectoris: Secondary | ICD-10-CM | POA: Diagnosis not present

## 2015-10-27 DIAGNOSIS — Z01810 Encounter for preprocedural cardiovascular examination: Secondary | ICD-10-CM

## 2015-10-27 DIAGNOSIS — R0789 Other chest pain: Secondary | ICD-10-CM | POA: Diagnosis present

## 2015-10-27 DIAGNOSIS — Z952 Presence of prosthetic heart valve: Secondary | ICD-10-CM

## 2015-10-27 DIAGNOSIS — Z09 Encounter for follow-up examination after completed treatment for conditions other than malignant neoplasm: Secondary | ICD-10-CM

## 2015-10-27 DIAGNOSIS — J90 Pleural effusion, not elsewhere classified: Secondary | ICD-10-CM

## 2015-10-27 DIAGNOSIS — R262 Difficulty in walking, not elsewhere classified: Secondary | ICD-10-CM

## 2015-10-27 DIAGNOSIS — N529 Male erectile dysfunction, unspecified: Secondary | ICD-10-CM | POA: Diagnosis present

## 2015-10-27 DIAGNOSIS — I359 Nonrheumatic aortic valve disorder, unspecified: Secondary | ICD-10-CM | POA: Diagnosis present

## 2015-10-27 DIAGNOSIS — I25110 Atherosclerotic heart disease of native coronary artery with unstable angina pectoris: Secondary | ICD-10-CM | POA: Diagnosis not present

## 2015-10-27 DIAGNOSIS — J9811 Atelectasis: Secondary | ICD-10-CM | POA: Diagnosis not present

## 2015-10-27 DIAGNOSIS — I9789 Other postprocedural complications and disorders of the circulatory system, not elsewhere classified: Secondary | ICD-10-CM | POA: Diagnosis not present

## 2015-10-27 DIAGNOSIS — Z833 Family history of diabetes mellitus: Secondary | ICD-10-CM | POA: Diagnosis not present

## 2015-10-27 DIAGNOSIS — R51 Headache: Secondary | ICD-10-CM | POA: Diagnosis not present

## 2015-10-27 DIAGNOSIS — I712 Thoracic aortic aneurysm, without rupture: Secondary | ICD-10-CM | POA: Diagnosis present

## 2015-10-27 DIAGNOSIS — Z79899 Other long term (current) drug therapy: Secondary | ICD-10-CM | POA: Diagnosis not present

## 2015-10-27 DIAGNOSIS — E1149 Type 2 diabetes mellitus with other diabetic neurological complication: Secondary | ICD-10-CM | POA: Diagnosis present

## 2015-10-27 DIAGNOSIS — R931 Abnormal findings on diagnostic imaging of heart and coronary circulation: Secondary | ICD-10-CM | POA: Diagnosis not present

## 2015-10-27 DIAGNOSIS — I2089 Other forms of angina pectoris: Secondary | ICD-10-CM | POA: Diagnosis present

## 2015-10-27 DIAGNOSIS — IMO0002 Reserved for concepts with insufficient information to code with codable children: Secondary | ICD-10-CM | POA: Diagnosis present

## 2015-10-27 DIAGNOSIS — I471 Supraventricular tachycardia: Secondary | ICD-10-CM | POA: Diagnosis not present

## 2015-10-27 HISTORY — DX: Atherosclerotic heart disease of native coronary artery without angina pectoris: I25.10

## 2015-10-27 HISTORY — PX: CARDIAC CATHETERIZATION: SHX172

## 2015-10-27 LAB — GLUCOSE, CAPILLARY
GLUCOSE-CAPILLARY: 269 mg/dL — AB (ref 65–99)
GLUCOSE-CAPILLARY: 233 mg/dL — AB (ref 65–99)
GLUCOSE-CAPILLARY: 223 mg/dL — AB (ref 65–99)
GLUCOSE-CAPILLARY: 231 mg/dL — AB (ref 65–99)
Glucose-Capillary: 246 mg/dL — ABNORMAL HIGH (ref 65–99)

## 2015-10-27 LAB — CREATININE, SERUM
Creatinine, Ser: 0.82 mg/dL (ref 0.61–1.24)
GFR calc non Af Amer: >60 mL/min (ref >60)

## 2015-10-27 LAB — CBC
HCT: 38.6 % — ABNORMAL LOW (ref 39.0–52.0)
Hemoglobin: 12.9 g/dL — ABNORMAL LOW (ref 13.0–17.0)
MCH: 30.3 pg (ref 26.0–34.0)
MCHC: 33.4 g/dL (ref 30.0–36.0)
MCV: 90.6 fL (ref 78.0–100.0)
PLATELETS: 221 10*3/uL (ref 150–400)
RBC: 4.26 MIL/uL (ref 4.22–5.81)
RDW: 12.9 % (ref 11.5–15.5)
WBC: 6.8 10*3/uL (ref 4.0–10.5)

## 2015-10-27 SURGERY — LEFT HEART CATH AND CORONARY ANGIOGRAPHY
Anesthesia: LOCAL

## 2015-10-27 MED ORDER — LIDOCAINE HCL (PF) 1 % IJ SOLN
INTRAMUSCULAR | Status: DC | PRN
  Administered 2015-10-27: 6 mL via INTRADERMAL

## 2015-10-27 MED ORDER — ATORVASTATIN CALCIUM 40 MG PO TABS
40.0000 mg | ORAL_TABLET | Freq: Every day | ORAL | Status: DC
  Administered 2015-10-27 – 2015-10-28 (×2): 40 mg via ORAL
  Filled 2015-10-27 (×2): qty 1

## 2015-10-27 MED ORDER — MIDAZOLAM HCL 2 MG/2ML IJ SOLN
INTRAMUSCULAR | Status: AC
  Filled 2015-10-27: qty 2

## 2015-10-27 MED ORDER — LIDOCAINE HCL (PF) 1 % IJ SOLN
INTRAMUSCULAR | Status: AC
  Filled 2015-10-27: qty 30

## 2015-10-27 MED ORDER — FENTANYL CITRATE (PF) 100 MCG/2ML IJ SOLN
INTRAMUSCULAR | Status: DC | PRN
  Administered 2015-10-27: 50 ug via INTRAVENOUS

## 2015-10-27 MED ORDER — FENTANYL CITRATE (PF) 100 MCG/2ML IJ SOLN
INTRAMUSCULAR | Status: AC
  Filled 2015-10-27: qty 2

## 2015-10-27 MED ORDER — LOSARTAN POTASSIUM 50 MG PO TABS
100.0000 mg | ORAL_TABLET | Freq: Every day | ORAL | Status: DC
  Administered 2015-10-27: 11:00:00 100 mg via ORAL
  Filled 2015-10-27: qty 2

## 2015-10-27 MED ORDER — ASPIRIN EC 81 MG PO TBEC
81.0000 mg | DELAYED_RELEASE_TABLET | Freq: Every day | ORAL | Status: DC
  Administered 2015-10-28: 81 mg via ORAL
  Filled 2015-10-27: qty 1

## 2015-10-27 MED ORDER — VERAPAMIL HCL 2.5 MG/ML IV SOLN
INTRAVENOUS | Status: AC
  Filled 2015-10-27: qty 2

## 2015-10-27 MED ORDER — IOPAMIDOL (ISOVUE-370) INJECTION 76%
INTRAVENOUS | Status: DC | PRN
  Administered 2015-10-27: 85 mL via INTRA_ARTERIAL

## 2015-10-27 MED ORDER — VERAPAMIL HCL 2.5 MG/ML IV SOLN
INTRAVENOUS | Status: DC | PRN
  Administered 2015-10-27: 10 mL via INTRA_ARTERIAL

## 2015-10-27 MED ORDER — MIDAZOLAM HCL 2 MG/2ML IJ SOLN
INTRAMUSCULAR | Status: DC | PRN
  Administered 2015-10-27: 2 mg via INTRAVENOUS

## 2015-10-27 MED ORDER — INFLUENZA VAC SPLIT QUAD 0.5 ML IM SUSY
0.5000 mL | PREFILLED_SYRINGE | INTRAMUSCULAR | Status: DC
  Filled 2015-10-27: qty 0.5

## 2015-10-27 MED ORDER — SODIUM CHLORIDE 0.9 % IV SOLN
250.0000 mL | INTRAVENOUS | Status: DC | PRN
  Administered 2015-10-29: 16:00:00 via INTRAVENOUS

## 2015-10-27 MED ORDER — FAMOTIDINE 10 MG PO TABS
10.0000 mg | ORAL_TABLET | Freq: Two times a day (BID) | ORAL | Status: DC | PRN
  Filled 2015-10-27: qty 1

## 2015-10-27 MED ORDER — HEPARIN (PORCINE) IN NACL 2-0.9 UNIT/ML-% IJ SOLN
INTRAMUSCULAR | Status: DC | PRN
  Administered 2015-10-27: 1000 mL

## 2015-10-27 MED ORDER — AMLODIPINE BESYLATE 2.5 MG PO TABS
2.5000 mg | ORAL_TABLET | Freq: Every day | ORAL | Status: DC
  Administered 2015-10-27 – 2015-10-28 (×2): 2.5 mg via ORAL
  Filled 2015-10-27 (×2): qty 1

## 2015-10-27 MED ORDER — CARVEDILOL 6.25 MG PO TABS
6.2500 mg | ORAL_TABLET | Freq: Two times a day (BID) | ORAL | Status: DC
  Administered 2015-10-27 – 2015-10-28 (×4): 6.25 mg via ORAL
  Filled 2015-10-27: qty 2
  Filled 2015-10-27 (×3): qty 1

## 2015-10-27 MED ORDER — HEPARIN SODIUM (PORCINE) 5000 UNIT/ML IJ SOLN
5000.0000 [IU] | Freq: Three times a day (TID) | INTRAMUSCULAR | Status: DC
  Administered 2015-10-27 – 2015-10-28 (×3): 5000 [IU] via SUBCUTANEOUS
  Filled 2015-10-27 (×4): qty 1

## 2015-10-27 MED ORDER — SODIUM CHLORIDE 0.9 % IV SOLN: INTRAVENOUS | Status: AC

## 2015-10-27 MED ORDER — SODIUM CHLORIDE 0.9 % IV SOLN
INTRAVENOUS | Status: DC
  Administered 2015-10-27: 09:00:00 via INTRAVENOUS

## 2015-10-27 MED ORDER — ONDANSETRON HCL 4 MG/2ML IJ SOLN: 4.0000 mg | Freq: Four times a day (QID) | INTRAMUSCULAR | Status: DC | PRN

## 2015-10-27 MED ORDER — ASPIRIN 81 MG PO CHEW
CHEWABLE_TABLET | ORAL | Status: AC
  Administered 2015-10-27: 81 mg via ORAL
  Filled 2015-10-27: qty 1

## 2015-10-27 MED ORDER — INSULIN ASPART 100 UNIT/ML ~~LOC~~ SOLN
0.0000 [IU] | Freq: Three times a day (TID) | SUBCUTANEOUS | Status: DC
  Administered 2015-10-27: 8 [IU] via SUBCUTANEOUS
  Administered 2015-10-27 (×2): 5 [IU] via SUBCUTANEOUS
  Administered 2015-10-28: 3 [IU] via SUBCUTANEOUS
  Administered 2015-10-28: 5 [IU] via SUBCUTANEOUS
  Administered 2015-10-28: 8 [IU] via SUBCUTANEOUS

## 2015-10-27 MED ORDER — HEPARIN (PORCINE) IN NACL 2-0.9 UNIT/ML-% IJ SOLN
INTRAMUSCULAR | Status: AC
  Filled 2015-10-27: qty 1000

## 2015-10-27 MED ORDER — HEPARIN SODIUM (PORCINE) 1000 UNIT/ML IJ SOLN
INTRAMUSCULAR | Status: AC
  Filled 2015-10-27: qty 1

## 2015-10-27 MED ORDER — INSULIN LISPRO 100 UNIT/ML (KWIKPEN): 5.0000 [IU] | PEN_INJECTOR | Freq: Three times a day (TID) | SUBCUTANEOUS | Status: DC

## 2015-10-27 MED ORDER — SODIUM CHLORIDE 0.9% FLUSH: 3.0000 mL | INTRAVENOUS | Status: DC | PRN

## 2015-10-27 MED ORDER — ASPIRIN 81 MG PO CHEW
81.0000 mg | CHEWABLE_TABLET | ORAL | Status: AC
  Administered 2015-10-27: 81 mg via ORAL

## 2015-10-27 MED ORDER — INSULIN GLARGINE 100 UNIT/ML ~~LOC~~ SOLN
20.0000 [IU] | Freq: Every day | SUBCUTANEOUS | Status: DC
  Administered 2015-10-27 – 2015-10-28 (×2): 20 [IU] via SUBCUTANEOUS
  Filled 2015-10-27 (×4): qty 0.2

## 2015-10-27 MED ORDER — SODIUM CHLORIDE 0.9% FLUSH
3.0000 mL | Freq: Two times a day (BID) | INTRAVENOUS | Status: DC
  Administered 2015-10-27 – 2015-10-28 (×4): 3 mL via INTRAVENOUS

## 2015-10-27 MED ORDER — HEPARIN SODIUM (PORCINE) 1000 UNIT/ML IJ SOLN
INTRAMUSCULAR | Status: DC | PRN
  Administered 2015-10-27: 5000 [IU] via INTRAVENOUS

## 2015-10-27 MED ORDER — IOPAMIDOL (ISOVUE-370) INJECTION 76%
INTRAVENOUS | Status: AC
  Filled 2015-10-27: qty 100

## 2015-10-27 MED ORDER — ACETAMINOPHEN 325 MG PO TABS
650.0000 mg | ORAL_TABLET | ORAL | Status: DC | PRN
  Administered 2015-10-28: 650 mg via ORAL
  Filled 2015-10-27: qty 2

## 2015-10-27 MED ORDER — METFORMIN HCL 500 MG PO TABS: 1000.0000 mg | ORAL_TABLET | Freq: Two times a day (BID) | ORAL | Status: DC

## 2015-10-27 SURGICAL SUPPLY — 12 items
CATH INFINITI 5 FR JL3.5 (CATHETERS) ×1 IMPLANT
CATH INFINITI 5FR AL1 (CATHETERS) ×1 IMPLANT
CATH INFINITI 5FR ANG PIGTAIL (CATHETERS) ×1 IMPLANT
CATH INFINITI JR4 5F (CATHETERS) ×1 IMPLANT
DEVICE RAD COMP TR BAND LRG (VASCULAR PRODUCTS) ×1 IMPLANT
GLIDESHEATH SLEND A-KIT 6F 22G (SHEATH) ×1 IMPLANT
KIT HEART LEFT (KITS) ×2 IMPLANT
PACK CARDIAC CATHETERIZATION (CUSTOM PROCEDURE TRAY) ×2 IMPLANT
TRANSDUCER W/STOPCOCK (MISCELLANEOUS) ×2 IMPLANT
TUBING CIL FLEX 10 FLL-RA (TUBING) ×2 IMPLANT
WIRE EMERALD ST .035X150CM (WIRE) ×1 IMPLANT
WIRE SAFE-T 1.5MM-J .035X260CM (WIRE) ×1 IMPLANT

## 2015-10-27 NOTE — H&P (View-Only) (Signed)
  1.) Reason for visit: BP check  2.) Name of MD requesting visit: Dr. Acie Fredrickson  3.) H&P: Patient has a history of HTN, DM, ED, aortic stenosis, dilated aortic root, and hyperlipidemia. Patient's BP 138/80 and HR 80. Patient stated he has been having heart burn 2 to 3 times a day for two weeks. Patient denies SOB.  4.) ROS related to problem: Patient's BP today 138/80, HR 80. Patient complained of heart burn today. Patient stated his pain is in his chest and if feels like gas building up. Patient states he does not eat spicy foods, and this heart burn comes on different times of the day. Patient states he usually has not eaten when this heart burn comes on. Patient stated it does not last long, but has been happening 2 to 3 times a day for about two weeks.  5.) Assessment and plan per MD: Dr. Acie Fredrickson wanted patient to continue his current BP medications. Per Dr. Acie Fredrickson, patient will have a lexiscan test due to his chest pain. Patient will also start taking Pepcid as needed for heart burn to see if symptoms improve.

## 2015-10-27 NOTE — Interval H&P Note (Signed)
History and Physical Interval Note:  10/27/2015 8:54 AM  [name]  has presented today for surgery, with the diagnosis of Chest Pain/atypical angina with abnormal stress test  The various methods of treatment have been discussed with the patient and family. After consideration of risks, benefits and other options for treatment, the patient has consented to  Procedure(s): Left Heart Cath and Coronary Angiography (N/A) With Possible Percutaneous Coronary Intervention as a surgical intervention .  The patient's history has been reviewed, patient examined, no change in status, stable for surgery.  I have reviewed the patient's chart and labs.  Questions were answered to the patient's satisfaction.    Cath Lab Visit (complete for each Cath Lab visit)  Clinical Evaluation Leading to the Procedure:   ACS: No.  Non-ACS:    Anginal Classification: CCS III  Anti-ischemic medical therapy: Maximal Therapy (2 or more classes of medications)  Non-Invasive Test Results: Intermediate-risk stress test findings: cardiac mortality 1-3%/year  Prior CABG: No previous CABG    [name]

## 2015-10-27 NOTE — Progress Notes (Signed)
TR BAND REMOVAL  LOCATION:    right radial  DEFLATED PER PROTOCOL:    Yes.    TIME BAND OFF / DRESSING APPLIED:    1200   SITE UPON ARRIVAL:    Level 0  SITE AFTER BAND REMOVAL:    Level 0  CIRCULATION SENSATION AND MOVEMENT:    Within Normal Limits   Yes.    COMMENTS:   Rechecked dressing during remainder of shift with no change in assessment. Dressing remains dry and intact. CSMs wnls. No complaints of pain.

## 2015-10-27 NOTE — Consult Note (Signed)
[name]       [place] [address]             [phone]        [name] [facility] Medical Record [id number] Date of Birth: 1946-01-12  Referring: [name] Primary Care: Brandon Pry, DO  Chief Complaint:   CAD, Aortic Stenosis, Aneurysm  History of Present Illness:      [name] is a 70 yo African American Male well known to [facility].  I saw the patient in  July of this year for an Ascending Aortic Aneurysm.  CT scan obtained at that time measured the aneurysm to be 4.7 cm which was stable from previous scan obtained in 2013.  At that time it was recommended the patient undergo close follow up with serial CT scans.  The patient has been experiencing chest pain more frequently.  He therefore had a stress test by Dr. Acie Mathis last week with was positive and he was scheduled for elective cardiac catheterization.  This was done today and showed severe CAD.  It was felt this combined with his Aortic Stenosis and Aortic Aneurysm he would benefit from coronary bypass grafting and [facility] consult was obtained. Currently the patient continues to have some mild chest pain.  He states this has been present for the past year.  He also get short of breath walking up a flight of stairs, but states he can walk his block without difficulty.  He is a diabetic on both oral medications and insulin therapy.  He denies smoking history.  He states he has not seen a dentist in many years.  His top teeth are dentures.  There is only 1 tooth left on the bottom per patient which is broken off.    Serial echocardiograms 2013 2015 in 2017- in 2013 a peak aortic pressure was 32 with a mean of 22 and question of a bicuspid valve, and 2015 the patient echo report notes a trileaflet valve peak aortic pressure of 28 and a mean of 18. On the most current echocardiogram the patient has moderate aortic stenosis with a peak pressure of 44 and a mean of 24. Peak aortic valve velocity is 331 cm/s.      Patient does have a family history of aneurysm disease, though the details of this are not clear, he notes that his mother had some previous cardiac surgery and then 5 years later had a ruptured aneurysm and died at age 72. The details of this are unclear. The patient has one brother who died at age 45 of unexplained sudden death.      Current Activity/ Functional Status: Patient is independent with mobility/ambulation, transfers, ADL's, IADL's.   Zubrod Score: At the time of surgery this patient's most appropriate activity status/level should be described as: []     0    Normal activity, no symptoms [x]     1    Restricted in physical strenuous activity but ambulatory, able to do out light work []     2    Ambulatory and capable of self care, unable to do work activities, up and about                 more than 50%  Of the time                            []     3    Only limited  self care, in bed greater than 50% of waking hours []     4    Completely disabled, no self care, confined to bed or chair []     5    Moribund  Past Medical History:  Diagnosis Date  . Aortic stenosis    followed by Dr. Acie Mathis  . Cancer ([place])   . Carpal tunnel syndrome 09/24/2014   Bilateral  . Diabetes mellitus, type 2 ([place])   . Erectile dysfunction   . Heart murmur   . Hyperlipidemia   . Hypertension     Past Surgical History:  Procedure Laterality Date  . CARDIAC CATHETERIZATION N/A 10/27/2015   Procedure: Left Heart Cath and Coronary Angiography;  Surgeon: [name], MD;  Location: [facility];  Service: Cardiovascular;  Laterality: N/A;  . CYST REMOVAL HAND     right hand    History  Smoking Status  . Never Smoker  Smokeless Tobacco  . Never Used    History  Alcohol Use No    Social History   Social History  . Marital status: Married    Spouse name: N/A  . Number of children: N/A  . Years of education: N/A   Occupational History  . Not on file.   Social History Main  Topics  . Smoking status: Never Smoker  . Smokeless tobacco: Never Used  . Alcohol use No  . Drug use: No  . Sexual activity: Yes   Other Topics Concern  . Not on file   Social History Narrative   Married to [name] for 89 years   Retired as Chiropodist   5 children   Never Smoked   Alcohol use- no   Regular exercise: walks around the block ea day   Caffeine use: occasional     No Known Allergies  Current Facility-Administered Medications  Medication Dose Route Frequency Provider Last Rate Last Dose  . 0.9 %  sodium chloride infusion  250 mL Intravenous PRN [name], MD      . 0.9 %  sodium chloride infusion   Intravenous Continuous [name], MD 100 mL/hr at 10/27/15 1030    . acetaminophen (TYLENOL) tablet 650 mg  650 mg Oral Q4H PRN [name], MD      . amLODipine [facility]) tablet 2.5 mg  2.5 mg Oral Daily [name], MD   2.5 mg at 10/27/15 1107  . [START ON 10/28/2015] aspirin EC tablet 81 mg  81 mg Oral Daily [name], MD      . atorvastatin (LIPITOR) tablet 40 mg  40 mg Oral Daily [name], MD   40 mg at 10/27/15 1107  . carvedilol (COREG) tablet 6.25 mg  6.25 mg Oral BID [name], MD   6.25 mg at 10/27/15 1107  . famotidine (PEPCID) tablet 10 mg  10 mg Oral BID PRN [name], MD      . heparin injection 5,000 Units  5,000 Units Subcutaneous Q8H [name], MD      . insulin aspart (novoLOG) injection 0-15 Units  0-15 Units Subcutaneous TID [facility] [name], MD   8 Units at 10/27/15 1100  . insulin glargine (LANTUS) injection 20 Units  20 Units Subcutaneous Q2200 [name], MD      . losartan (COZAAR) tablet 100 mg  100 mg Oral Daily [name], MD   100 mg at 10/27/15 1107  . metFORMIN (GLUCOPHAGE) tablet  1,000 mg  1,000 mg Oral BID WC [name], MD      . ondansetron [facility]) injection 4 mg  4 mg Intravenous Q6H PRN [name], MD      . sodium chloride flush (NS) 0.9 %  injection 3 mL  3 mL Intravenous Q12H [name], MD      . sodium chloride flush (NS) 0.9 % injection 3 mL  3 mL Intravenous PRN [name], MD        Prescriptions Prior to Admission  Medication Sig Dispense Refill Last Dose  . amLODipine (NORVASC) 2.5 MG tablet Take 1 tablet (2.5 mg total) by mouth daily. 90 tablet 3 10/26/2015 at Unknown time  . aspirin 81 MG tablet Take 81 mg by mouth daily.     10/26/2015 at Unknown time  . atorvastatin (LIPITOR) 40 MG tablet Take 1 tablet (40 mg total) by mouth daily. 30 tablet 11 10/26/2015 at Unknown time  . carvedilol (COREG) 6.25 MG tablet Take 1 tablet (6.25 mg total) by mouth 2 (two) times daily. 180 tablet 3 10/26/2015 at Unknown time  . famotidine (PEPCID AC) 10 MG chewable tablet Chew 1 tablet (10 mg total) by mouth 2 (two) times daily as needed for heartburn.   Past Week at Unknown time  . glucose blood test strip Use as instructed 100 each 12 Taking  . Insulin Glargine (LANTUS SOLOSTAR) 100 UNIT/ML Solostar Pen Inject 25 Units into the skin daily at 10 pm. 5 pen 5 10/26/2015 at Unknown time  . insulin lispro (HUMALOG KWIKPEN) 100 UNIT/ML KiwkPen Use 5 to 10 units three times daily 10-15 minutes before meals (Patient taking differently: Inject 5-10 Units into the skin 3 (three) times daily before meals. ) 15 mL 5 10/26/2015 at Unknown time  . losartan (COZAAR) 100 MG tablet Take 1 tablet (100 mg total) by mouth daily. 90 tablet 3 10/26/2015 at Unknown time  . metFORMIN (GLUCOPHAGE) 1000 MG tablet Take 1 tablet (1,000 mg total) by mouth 2 (two) times daily with a meal. 180 tablet 1 10/26/2015 at Unknown time  . HUMALOG KWIKPEN 100 UNIT/ML KiwkPen USE 5 TO 10 UNITS THREE TIMES DAILY 10-15 MINUTES BEFORE MEALS (Patient not taking: Reported on 10/25/2015) 15 mL 1 Not Taking at Unknown time    Family History  Problem Relation Age of Onset  . Cancer Father     Throat cancer  . Diabetes Sister    Review of Systems:   Constitutional:  negative Eyes: negative Respiratory: positive for dyspnea on exertion and pleurisy/chest pain Cardiovascular: positive for chest pressure/discomfort and exertional chest pressure/discomfort Gastrointestinal: negative Integument/breast: positive for skin lesion(s) Neurological: negative Endocrine: negative, H/O diabetes on oral meds and insulin     Cardiac Review of Systems: Y or N  Chest Pain [ y   ]  Resting SOB [n   ] Exertional SOB  [ y ]  Orthopnea [ n ]   Pedal Edema [ n  ]    Palpitations [n  ] Syncope  [ n ]   Presyncope [n   ]  General Review of Systems: [Y] = yes [  ]=no Constitional: recent weight change [  ]; anorexia [  ]; fatigue [  ]; nausea [  ]; night sweats [  ]; fever [  ]; or chills [  ]  Dental: poor dentition[ dentures ]; Last Dentist visit:   Eye : blurred vision [  ]; diplopia [   ]; vision changes [  ];  Amaurosis fugax[  ]; Resp: cough [  ];  wheezing[  ];  hemoptysis[  ]; shortness of breath[  ]; paroxysmal nocturnal dyspnea[  ]; dyspnea on exertion[  ]; or orthopnea[  ];  GI:  gallstones[  ], vomiting[  ];  dysphagia[  ]; melena[  ];  hematochezia [  ]; heartburn[  ];   Hx of  Colonoscopy[  ]; GU: kidney stones [  ]; hematuria[  ];   dysuria [  ];  nocturia[  ];  history of     obstruction [  ]; urinary frequency [  ]             Skin: rash, swelling[  ];, hair loss[  ];  peripheral edema[  ];  or itching[  ]; Musculosketetal: myalgias[  ];  joint swelling[  ];  joint erythema[  ];  joint pain[  ];  back pain[  ];  Heme/Lymph: bruising[  ];  bleeding[  ];  anemia[  ];  Neuro: TIA[  ];  headaches[  ];  stroke[  ];  vertigo[  ];  seizures[  ];   paresthesias[  ];  difficulty walking[ n ];  Psych:depression[  ]; anxiety[  ];  Endocrine: diabetes[  ];  thyroid dysfunction[  ];  Immunizations: Flu [  ]; Pneumococcal[  ];  Other:  General appearance: alert, cooperative and no distress Neck: no adenopathy,  no carotid bruit, no JVD, supple, symmetrical, trachea midline, thyroid not enlarged, symmetric, no tenderness/mass/nodules and + carotid bruit Resp: clear to auscultation bilaterally Cardio: regular rate and rhythm and systolic murmur: systolic ejection 3/6, crescendo and decrescendo at 2nd right intercostal space, radiates to carotids GI: soft, non-tender; bowel sounds normal; no masses,  no organomegaly Extremities: extremities normal, atraumatic, no cyanosis or edema Neurologic: Grossly normal  Diagnostic Studies & Laboratory data:     Recent Radiology Findings:   EXAM: CT ANGIOGRAPHY CHEST WITH CONTRAST  TECHNIQUE: Multidetector CT imaging of the chest was performed using the standard protocol during bolus administration of intravenous contrast. Multiplanar CT image reconstructions and MIPs were obtained to evaluate the vascular anatomy.  CONTRAST:  100 mL Isovue 370.  COMPARISON:  03/31/2011.  FINDINGS: Mediastinum/Lymph Nodes: The thoracic inlet is within normal limits. No mediastinal or hilar adenopathy is noted.  Cardiovascular: Truncus anomaly of the aorta is noted. Dilatation of the ascending aorta is seen measuring 4.7 cm in greatest dimension at the level of the main pulmonary artery. Given some variation in the measurement technique this is roughly stable from the prior exam. No evidence of dissection is noted. Normal tapering of the aorta in the descending segment is noted. Mild atherosclerotic calcifications are seen. Pulmonary artery shows no definitive pulmonary emboli. Coronary calcifications are seen. Cardiac structures are otherwise within normal limits.  Lungs/Pleura: Mild emphysematous changes are noted. Scattered calcified and noncalcified pleural plaques are seen bilaterally. Stable nodule is noted within the right middle lobe similar to that seen on the prior exam. Given its stability it is consistent with a benign etiology.  Upper abdomen:  Fatty infiltration of the liver is noted.  Musculoskeletal: No chest wall mass or suspicious bone lesions identified.  Review of the MIP images confirms the above findings.  IMPRESSION: Stable dilatation of the ascending aorta as described. Ascending thoracic aortic aneurysm. Recommend semi-annual imaging followup by  CTA or MRA and referral to cardiothoracic surgery if not already obtained. This recommendation follows 2010 ACCF/AHA/AATS/ACR/ASA/SCA/SCAI/SIR/STS/SVM Guidelines for the Diagnosis and Management of Patients With Thoracic Aortic Disease. Circulation. 2010; [id number]   Electronically Signed   By: Inez Catalina M.D.   On: 08/27/2015 12:41 I have independently reviewed the above radiology studies  and reviewed the findings with the patient. CATH: Procedures   Left Heart Cath and Coronary Angiography  Conclusion     Ost RCA to Prox RCA lesion, 40 %stenosed -followed by aneurysmal segment. Then Mid RCA lesion, 90 %stenosed.  Ost LAD to Prox LAD lesion, aneurysmal segment. Followed by. Mid LAD to Dist LAD lesion, 90 %stenosed.  Prox Cx lesion, 70 %stenosed. Mid Cx lesion, 70 %stenosed prior to OM 2. Mid Cx to Dist Cx lesion, 75 %stenosed after OM 2  1st Mrg-1 lesion, 45 %stenosed. 1st Mrg-2 lesion, 70 %stenosed.  The left ventricular systolic function is low normal.  LV end diastolic pressure is normal. No signs of heart failure  The left ventricular ejection fraction is 50-55% by visual estimate.  There is moderate aortic valve stenosis. There is moderate (3+) aortic regurgitation.   Multivessel coronary disease including severe mid LAD and mid RCA lesions as well as diffuse moderate to severe lesions in circumflex and OM 1 lesions and a codominant circumflex. This is in the setting of moderate aortic stenosis and ascending aortic aneurysmal dilation of 4.7 cm.   Recommendation at this time would be to consider CABG/AVR and potentially even ascending  aortic root replacement.  Given his persistent symptoms along with severe LAD and RCA lesions, it would be preferable for him to be able to have surgery sooner rather than later. I discussed with CT surgery (he was seen by Dr. Servando Snare earlier this summer). He will be seen later on today or early tomorrow morning and potentially schedule surgery for as early as Friday.    [name], M.D., M.S. Interventional Cardiologist    LV EF: 50% -   55%  ------------------------------------------------------------------- Indications:      Abnormal EKG (R94.31).  ------------------------------------------------------------------- History:   PMH:   Chest pain.  Murmur.  Aortic valve disease.  Risk factors:  Hypertension. Diabetes mellitus. Dyslipidemia.  -ECHO 07/2014 current echo pending  ------------------------------------------------------------------ Study Conclusions  - Left ventricle: The cavity size was normal. Wall thickness was   increased in a pattern of mild LVH. Systolic function was normal.   The estimated ejection fraction was in the range of 50% to 55%.   There is hypokinesis of the basalinferolateral and inferior   myocardium. Doppler parameters are consistent with abnormal left   ventricular relaxation (grade 1 diastolic dysfunction). - Aortic valve: Valve mobility was restricted. There was moderate   stenosis. - Aortic root: The aortic root was mildly dilated. - Ascending aorta: The ascending aorta was moderately dilated.  Impressions:  - Basal inferior and inferior lateral hypokinesis with overall   preserved LV function grade 1 diastolic dysfunction; calcified   aortic valve with moderate AS (mean gradient 24 mmHg); moderately   dilated ascending aorta (4.6 cm); suggest CTA or MRA to further   assess.  Recent Lab Findings: Lab Results  Component Value Date   WBC 8.2 10/25/2015   HGB 13.6 10/25/2015   HCT 40.2 10/25/2015   PLT 255 10/25/2015   GLUCOSE  286 (H) 10/25/2015   CHOL 228 (H) 07/23/2015   TRIG 77 07/23/2015   HDL 98 07/23/2015   LDLDIRECT 112.6 05/14/2012  [place] 115 07/23/2015   ALT 22 07/23/2015   AST 23 07/23/2015   NA 136 10/25/2015   K 5.0 10/25/2015   CL 100 10/25/2015   CREATININE 1.06 10/25/2015   BUN 12 10/25/2015   CO2 29 10/25/2015   TSH 1.86 07/23/2015   INR 1.1 10/25/2015   HGBA1C 7.2 (H) 12/22/2014    Assessment / Plan:      1. CAD, Aortic Stenosis and insufficiency , Ascending aorta dilated to 4.7 cm with trunchus abnormality  plan for AVR, CABG,     replacement of ascending aorta on Friday 2. HTN- SBP 160, needs to be well controlled in setting of aneurysm. Will defer to cardiology for treatment 3. Preop workup ordered  I discussed with the patient and his wife the risks and options of proceeding with coronary artery bypass grafting and aortic valve replacement and replacement of ascending aorta. We discussed the use of mechanical versus tissue valve, patient is agreeable with proceeding with a tissue valve.    I  spent 40 minutes counseling the patient face to face and 50% or more the  time was spent in counseling and coordination of care. The total time spent in the appointment was 60 minutes.  [name] MD      [address] [place], [address] Office [phone]   [place]

## 2015-10-27 NOTE — Progress Notes (Addendum)
Patient arrived the unit on a hospital  Bed from [facility], assesment completed see flowsheet patient oriented to room and staff, placed on tele ccmd notified, bed in lowest position, call light within reach will continue to monitor

## 2015-10-28 ENCOUNTER — Inpatient Hospital Stay (HOSPITAL_COMMUNITY): Payer: Medicare Other

## 2015-10-28 ENCOUNTER — Other Ambulatory Visit (HOSPITAL_COMMUNITY): Payer: Self-pay | Admitting: Radiology

## 2015-10-28 DIAGNOSIS — I25119 Atherosclerotic heart disease of native coronary artery with unspecified angina pectoris: Secondary | ICD-10-CM

## 2015-10-28 DIAGNOSIS — Z01810 Encounter for preprocedural cardiovascular examination: Secondary | ICD-10-CM

## 2015-10-28 DIAGNOSIS — I350 Nonrheumatic aortic (valve) stenosis: Secondary | ICD-10-CM

## 2015-10-28 DIAGNOSIS — I25110 Atherosclerotic heart disease of native coronary artery with unstable angina pectoris: Secondary | ICD-10-CM

## 2015-10-28 DIAGNOSIS — I2510 Atherosclerotic heart disease of native coronary artery without angina pectoris: Secondary | ICD-10-CM

## 2015-10-28 LAB — GLUCOSE, CAPILLARY
GLUCOSE-CAPILLARY: 208 mg/dL — AB (ref 65–99)
GLUCOSE-CAPILLARY: 281 mg/dL — AB (ref 65–99)
Glucose-Capillary: 239 mg/dL — ABNORMAL HIGH (ref 65–99)
Glucose-Capillary: 162 mg/dL — ABNORMAL HIGH (ref 65–99)
Glucose-Capillary: 274 mg/dL — ABNORMAL HIGH (ref 65–99)

## 2015-10-28 LAB — PULMONARY FUNCTION TEST
DL/VA % pred: 87 %
DL/VA: 4.1 ml/min/mmHg/L
DLCO unc % pred: 53 %
DLCO unc: 18.04 ml/min/mmHg
FEF 25-75 Post: 4.35 L/sec
FEF 25-75 Pre: 3.96 L/sec
FEF2575-%Change-Post: 9 %
FEF2575-%Pred-Post: 164 %
FEF2575-%Pred-Pre: 149 %
FEV1-%Change-Post: 5 %
FEV1-%Pred-Post: 95 %
FEV1-%Pred-Pre: 90 %
FEV1-Post: 2.92 L
FEV1-Pre: 2.76 L
FEV1FVC-%Change-Post: 1 %
FEV1FVC-%Pred-Pre: 113 %
FEV6-%Change-Post: 3 %
FEV6-%Pred-Post: 85 %
FEV6-%Pred-Pre: 82 %
FEV6-Post: 3.27 L
FEV6-Pre: 3.16 L
FEV6FVC-%Pred-Post: 104 %
FEV6FVC-%Pred-Pre: 104 %
FVC-%Change-Post: 3 %
FVC-%Pred-Post: 81 %
FVC-%Pred-Pre: 78 %
FVC-Post: 3.27 L
FVC-Pre: 3.16 L
Post FEV1/FVC ratio: 89 %
Post FEV6/FVC ratio: 100 %
Pre FEV1/FVC ratio: 87 %
Pre FEV6/FVC Ratio: 100 %
RV % pred: 83 %
RV: 2.05 L
TLC % pred: 73 %
TLC: 5.34 L

## 2015-10-28 LAB — URINALYSIS, ROUTINE W REFLEX MICROSCOPIC
Bilirubin Urine: NEGATIVE
Glucose, UA: >1000 mg/dL — AB
Hgb urine dipstick: NEGATIVE
Ketones, ur: 15 mg/dL — AB
Leukocytes, UA: NEGATIVE
Nitrite: NEGATIVE
Protein, ur: NEGATIVE mg/dL
Specific Gravity, Urine: 1.023 (ref 1.005–1.030)
pH: 5 (ref 5.0–8.0)

## 2015-10-28 LAB — VAS US DOPPLER PRE CABG
LEFT ECA DIAS: -13 cm/s
LEFT VERTEBRAL DIAS: 16 cm/s
Left CCA dist dias: -25 cm/s
Left CCA dist sys: -105 cm/s
Left CCA prox dias: 22 cm/s
Left CCA prox sys: 100 cm/s
Left ICA dist dias: -23 cm/s
Left ICA dist sys: -58 cm/s
Left ICA prox dias: -14 cm/s
Left ICA prox sys: -43 cm/s
RIGHT ECA DIAS: -8 cm/s
RIGHT VERTEBRAL DIAS: 17 cm/s
Right CCA prox dias: 12 cm/s
Right CCA prox sys: 58 cm/s
Right cca dist sys: -48 cm/s

## 2015-10-28 LAB — ECHOCARDIOGRAM COMPLETE
Height: 6 in
Weight: 3232 oz

## 2015-10-28 LAB — CBC
HEMATOCRIT: 41.7 % (ref 39.0–52.0)
Hemoglobin: 14 g/dL (ref 13.0–17.0)
MCH: 30.6 pg (ref 26.0–34.0)
MCHC: 33.6 g/dL (ref 30.0–36.0)
MCV: 91.2 fL (ref 78.0–100.0)
PLATELETS: 257 10*3/uL (ref 150–400)
RBC: 4.57 MIL/uL (ref 4.22–5.81)
RDW: 13.1 % (ref 11.5–15.5)
WBC: 9.2 10*3/uL (ref 4.0–10.5)

## 2015-10-28 LAB — URINE MICROSCOPIC-ADD ON
Bacteria, UA: NONE SEEN
RBC / HPF: NONE SEEN RBC/hpf (ref 0–5)

## 2015-10-28 LAB — COMPREHENSIVE METABOLIC PANEL
ALT: 25 U/L (ref 17–63)
AST: 24 U/L (ref 15–41)
Albumin: 4 g/dL (ref 3.5–5.0)
Alkaline Phosphatase: 67 U/L (ref 38–126)
Anion gap: 6 (ref 5–15)
BUN: 11 mg/dL (ref 6–20)
CO2: 27 mmol/L (ref 22–32)
Calcium: 9.5 mg/dL (ref 8.9–10.3)
Chloride: 102 mmol/L (ref 101–111)
Creatinine, Ser: 0.97 mg/dL (ref 0.61–1.24)
GFR calc Af Amer: >60 mL/min (ref >60)
GFR calc non Af Amer: >60 mL/min (ref >60)
Glucose, Bld: 213 mg/dL — ABNORMAL HIGH (ref 65–99)
Potassium: 4.2 mmol/L (ref 3.5–5.1)
Sodium: 135 mmol/L (ref 135–145)
Total Bilirubin: 0.8 mg/dL (ref 0.3–1.2)
Total Protein: 7.1 g/dL (ref 6.5–8.1)

## 2015-10-28 LAB — PROTIME-INR
INR: 1.12
Prothrombin Time: 14.5 seconds (ref 11.4–15.2)

## 2015-10-28 LAB — ABO/RH: ABO/RH(D): O POS

## 2015-10-28 LAB — TYPE AND SCREEN
ABO/RH(D): O POS
Antibody Screen: NEGATIVE

## 2015-10-28 LAB — MRSA PCR SCREENING: MRSA by PCR: NEGATIVE

## 2015-10-28 MED ORDER — SODIUM CHLORIDE 0.9 % IV SOLN
INTRAVENOUS | Status: AC
  Administered 2015-10-29: 69.8 mL/h via INTRAVENOUS
  Filled 2015-10-28: qty 40

## 2015-10-28 MED ORDER — CHLORHEXIDINE GLUCONATE 0.12 % MT SOLN
15.0000 mL | Freq: Once | OROMUCOSAL | Status: AC
  Administered 2015-10-29: 15 mL via OROMUCOSAL
  Filled 2015-10-28: qty 15

## 2015-10-28 MED ORDER — DOPAMINE-DEXTROSE 3.2-5 MG/ML-% IV SOLN
0.0000 ug/kg/min | INTRAVENOUS | Status: AC
  Administered 2015-10-29: 3 ug/kg/min via INTRAVENOUS
  Filled 2015-10-28: qty 250

## 2015-10-28 MED ORDER — SODIUM CHLORIDE 0.9 % IV SOLN
INTRAVENOUS | Status: DC
  Filled 2015-10-28: qty 30

## 2015-10-28 MED ORDER — BISACODYL 5 MG PO TBEC
5.0000 mg | DELAYED_RELEASE_TABLET | Freq: Once | ORAL | Status: AC
  Administered 2015-10-28: 5 mg via ORAL
  Filled 2015-10-28: qty 1

## 2015-10-28 MED ORDER — POTASSIUM CHLORIDE 2 MEQ/ML IV SOLN
80.0000 meq | INTRAVENOUS | Status: DC
  Filled 2015-10-28: qty 40

## 2015-10-28 MED ORDER — DEXTROSE 5 % IV SOLN
750.0000 mg | INTRAVENOUS | Status: DC
  Filled 2015-10-28: qty 750

## 2015-10-28 MED ORDER — METOPROLOL TARTRATE 12.5 MG HALF TABLET
12.5000 mg | ORAL_TABLET | Freq: Once | ORAL | Status: AC
  Administered 2015-10-29: 12.5 mg via ORAL
  Filled 2015-10-28: qty 1

## 2015-10-28 MED ORDER — PLASMA-LYTE 148 IV SOLN
INTRAVENOUS | Status: AC
  Administered 2015-10-29: 500 mL
  Filled 2015-10-28: qty 2.5

## 2015-10-28 MED ORDER — CEFUROXIME SODIUM 1.5 G IJ SOLR
1.5000 g | INTRAMUSCULAR | Status: AC
  Administered 2015-10-29: .75 g via INTRAVENOUS
  Administered 2015-10-29: 1.5 g via INTRAVENOUS
  Filled 2015-10-28: qty 1.5

## 2015-10-28 MED ORDER — SODIUM CHLORIDE 0.9 % IV SOLN
1500.0000 mg | INTRAVENOUS | Status: AC
  Administered 2015-10-29: 1500 mg via INTRAVENOUS
  Filled 2015-10-28: qty 1500

## 2015-10-28 MED ORDER — CHLORHEXIDINE GLUCONATE CLOTH 2 % EX PADS
6.0000 | MEDICATED_PAD | Freq: Once | CUTANEOUS | Status: AC
  Administered 2015-10-28: 6 via TOPICAL

## 2015-10-28 MED ORDER — DEXMEDETOMIDINE HCL IN NACL 400 MCG/100ML IV SOLN
0.1000 ug/kg/h | INTRAVENOUS | Status: AC
  Administered 2015-10-29: 0.7 ug/kg/h via INTRAVENOUS
  Administered 2015-10-29: .5 ug/kg/h via INTRAVENOUS
  Filled 2015-10-28: qty 100

## 2015-10-28 MED ORDER — EPINEPHRINE HCL 1 MG/ML IJ SOLN
0.0000 ug/min | INTRAVENOUS | Status: DC
  Filled 2015-10-28: qty 4

## 2015-10-28 MED ORDER — SODIUM CHLORIDE 0.9 % IV SOLN
INTRAVENOUS | Status: AC
  Administered 2015-10-29: 3.1 [IU]/h via INTRAVENOUS
  Filled 2015-10-28: qty 2.5

## 2015-10-28 MED ORDER — TEMAZEPAM 15 MG PO CAPS: 15.0000 mg | ORAL_CAPSULE | Freq: Once | ORAL | Status: DC | PRN

## 2015-10-28 MED ORDER — MAGNESIUM SULFATE 50 % IJ SOLN
40.0000 meq | INTRAMUSCULAR | Status: DC
  Filled 2015-10-28: qty 10

## 2015-10-28 MED ORDER — NITROGLYCERIN IN D5W 200-5 MCG/ML-% IV SOLN
2.0000 ug/min | INTRAVENOUS | Status: AC
  Administered 2015-10-29: 10 ug/min via INTRAVENOUS
  Filled 2015-10-28: qty 250

## 2015-10-28 MED ORDER — PHENYLEPHRINE HCL 10 MG/ML IJ SOLN
30.0000 ug/min | INTRAVENOUS | Status: AC
  Administered 2015-10-29: 25 ug/min via INTRAVENOUS
  Filled 2015-10-28: qty 2

## 2015-10-28 MED ORDER — ALBUTEROL SULFATE (2.5 MG/3ML) 0.083% IN NEBU
2.5000 mg | INHALATION_SOLUTION | Freq: Once | RESPIRATORY_TRACT | Status: AC
  Administered 2015-10-28: 2.5 mg via RESPIRATORY_TRACT

## 2015-10-28 NOTE — Progress Notes (Signed)
Inpatient Diabetes Program Recommendations  AACE/ADA: New Consensus Statement on Inpatient Glycemic Control (2015)  Target Ranges:  Prepandial:   less than 140 mg/dL      Peak postprandial:   less than 180 mg/dL (1-2 hours)      Critically ill patients:  140 - 180 mg/dL  Results for [name], [name] (MRN [id number]) as of 10/28/2015 11:58  Ref. Range 10/27/2015 17:30 10/27/2015 20:31 10/28/2015 06:08 10/28/2015 08:44 10/28/2015 11:23  Glucose-Capillary Latest Ref Range: 65 - 99 mg/dL 223 (H) 231 (H) 208 (H) 239 (H) 281 (H)     Review of Glycemic Control  Diabetes history: DM2 Outpatient Diabetes medications: Lantus 25 units hs + Meal coverage 5-10 units tid + Metformin 1 gm bid Current orders for Inpatient glycemic control: Lantus 20 units + Novolog correction 0-15 units tid  Inpatient Diabetes Program Recommendations:  Noted patient for surgery tomorrow. For today's meals, please consider adding Novolog correction 4 units ac meals if eats 50%. Also consider A1c to determine prehospital glycemic control.  Thank you, Nani Gasser. Bran Aldridge, RN, MSN, CDE Inpatient Glycemic Control Team Team Pager [phone] (8am-5pm) 10/28/2015 12:03 PM

## 2015-10-28 NOTE — Progress Notes (Signed)
Patient ID: [name], male   DOB: 09-29-1945, 70 y.o.   MRN: [id number]      [address]       [place] [address]             [phone]                 1 Day Post-Op Procedure(s) (LRB): Left Heart Cath and Coronary Angiography (N/A)  LOS: 1 day   Subjective: No chest pain today, respiratory status good  Objective: Vital signs in last 24 hours: Patient Vitals for the past 24 hrs:  BP Temp Temp src Pulse Resp SpO2  10/28/15 1400 111/60 98.7 F (37.1 C) Oral 65 18 100 %  10/28/15 1048 123/66 99 F (37.2 C) Oral 71 15 99 %  10/28/15 0510 (!) 141/66 98.7 F (37.1 C) Oral 67 18 97 %  10/27/15 2029 123/68 98.2 F (36.8 C) Oral 62 18 99 %    Filed Weights   10/27/15 0800  Weight: 202 lb (91.6 kg)    Hemodynamic parameters for last 24 hours:    Intake/Output from previous day: 09/13 0701 - 09/14 0700 In: 355 [P.O.:240; I.V.:115] Out: 1150 [Urine:1150] Intake/Output this shift: Total I/O In: 240 [P.O.:240] Out: -   Scheduled Meds: . [START ON 10/29/2015] aminocaproic acid (AMICAR) for OHS   Intravenous To OR  . amLODipine  2.5 mg Oral Daily  . aspirin EC  81 mg Oral Daily  . atorvastatin  40 mg Oral Daily  . carvedilol  6.25 mg Oral BID  . [START ON 10/29/2015] cefUROXime (ZINACEF)  IV  750 mg Intravenous To OR  . [START ON 10/29/2015] chlorhexidine  15 mL Mouth/Throat Once  . Chlorhexidine Gluconate Cloth  6 each Topical Once  . [START ON 10/29/2015] dexmedetomidine  0.1-0.7 mcg/kg/hr Intravenous To OR  . [START ON 10/29/2015] DOPamine  0-10 mcg/kg/min Intravenous To OR  . [START ON 10/29/2015] epinephrine  0-10 mcg/min Intravenous To OR  . [START ON 10/29/2015] heparin-papaverine-plasmalyte irrigation   Irrigation To OR  . [START ON 10/29/2015] heparin 30,000 units/NS 1000 mL solution for CELLSAVER   Other To OR  . heparin  5,000 Units Subcutaneous Q8H  . Influenza vac split quadrivalent PF  0.5 mL Intramuscular Tomorrow-1000  . insulin aspart   0-15 Units Subcutaneous TID WC  . insulin glargine  20 Units Subcutaneous Q2200  . [START ON 10/29/2015] insulin (NOVOLIN-R) infusion   Intravenous To OR  . [START ON 10/29/2015] magnesium sulfate  40 mEq Other To OR  . [START ON 10/29/2015] metoprolol tartrate  12.5 mg Oral Once  . [START ON 10/29/2015] nitroGLYCERIN  2-200 mcg/min Intravenous To OR  . [START ON 10/29/2015] phenylephrine (NEO-SYNEPHRINE) Adult infusion  30-200 mcg/min Intravenous To OR  . [START ON 10/29/2015] potassium chloride  80 mEq Other To OR  . sodium chloride flush  3 mL Intravenous Q12H   Continuous Infusions:  PRN Meds:.sodium chloride, acetaminophen, famotidine, ondansetron (ZOFRAN) IV, sodium chloride flush, temazepam  General appearance: alert Neurologic: intact Heart: systolic murmur: holosystolic 3/6, crescendo throughout the precordium Lungs: clear to auscultation bilaterally Abdomen: soft, non-tender; bowel sounds normal; no masses,  no organomegaly Extremities: extremities normal, atraumatic, no cyanosis or edema and Homans sign is negative, no sign of DVT  Lab Results: CBC: Recent Labs  10/27/15 1259 10/28/15 0428  WBC 6.8 9.2  HGB 12.9* 14.0  HCT 38.6* 41.7  PLT 221 257   BMET:  Recent Labs  10/27/15 1259  10/28/15 0428  NA  --  135  K  --  4.2  CL  --  102  CO2  --  27  GLUCOSE  --  213*  BUN  --  11  CREATININE 0.82 0.97  CALCIUM  --  9.5    PT/INR:  Recent Labs  10/28/15 0428  LABPROT 14.5  INR 1.12     Radiology Dg Chest 2 View  Result Date: 10/28/2015 CLINICAL DATA:  Preop chest exam for CABG. No current chest complaints. History of recent cardiac catheterization. EXAM: CHEST  2 VIEW COMPARISON:  04/03/2014 FINDINGS: The heart size and mediastinal contours are within normal limits. No acute pulmonary infiltrate, consolidation, or effusion. Cardiomediastinal silhouette stable. Small faint nodular opacities are seen in the left upper lobe and could relate to pulmonary vessels  on end. No pneumothorax. Atherosclerotic calcifications in the aorta. Probable old left mid clavicular fracture. IMPRESSION: 1. No radiographic evidence for acute cardiopulmonary abnormality 2. Faint nodular opacities in the left upper lobe, possible pulmonary vessels on end versus small nodules. Suggest radiographic follow-up. Electronically Signed   By: Donavan Foil M.D.   On: 10/28/2015 12:35   ECHO: Study Conclusions  - Left ventricle: The cavity size was normal. There was mild   concentric hypertrophy. Systolic function was normal. The   estimated ejection fraction was in the range of 55% to 60%. Wall   motion was normal; there were no regional wall motion   abnormalities. Abnormal relaxation with increased filling   pressures. - Aortic valve: A bicuspid morphology cannot be excluded; severely   thickened, moderately calcified leaflets. There was moderate to   severe stenosis. Valve area (VTI): 0.85 cm^2. Valve area (Vmax):   0.89 cm^2. Valve area (Vmean): 0.83 cm^2. - Ascending aorta: The ascending aorta was mildly dilated.  Aortic valve peak velocity, S            345.92 cm/s  Assessment/Plan: S/P Procedure(s) (LRB): Left Heart Cath and Coronary Angiography (N/A) Plan CABG, aVR, replacement of ascending aorta tomorrow, Risks and options disused with patient and wife in detail. Plan to use tissue valve , patient is agreeable. The goals risks and alternatives of the planned surgical procedure CABG, aVR, replacement of ascending aorta have been discussed with the patient in detail. The risks of the procedure including death, infection, stroke, myocardial infarction, bleeding, blood transfusion have all been discussed specifically.  I have quoted [name] a 4% of perioperative mortality and a complication rate as high as 30 %. The patient's questions have been answered.[name] is willing  to proceed with the planned procedure.   [name] MD 10/28/2015 6:30  PM

## 2015-10-28 NOTE — Progress Notes (Signed)
Patient Name:  [name], DOB: [birth year], MRN: [id number] Primary Doctor: Drema Pry, DO Primary Cardiologist:   Date: 10/28/2015   SUBJECTIVE:  Patient is stable. He does have a headache this morning. It is not severe.   Past Medical History:  Diagnosis Date  . Aortic stenosis    followed by Dr. Acie Fredrickson  . Cancer ([place])   . Carpal tunnel syndrome 09/24/2014   Bilateral  . Coronary artery disease   . Diabetes mellitus, type 2 ([place])   . Erectile dysfunction   . Heart murmur   . Hyperlipidemia   . Hypertension    Vitals:   10/27/15 1627 10/27/15 1824 10/27/15 2029 10/28/15 0510  BP: 128/61 119/72 123/68 (!) 141/66  Pulse: 73 66 62 67  Resp: 18 18 18 18   Temp: 97.7 F (36.5 C) 98 F (36.7 C) 98.2 F (36.8 C) 98.7 F (37.1 C)  TempSrc: Oral Oral Oral Oral  SpO2: 97% 97% 99% 97%  Weight:      Height:        Intake/Output Summary (Last 24 hours) at 10/28/15 0649 Last data filed at 10/27/15 1634  Gross per 24 hour  Intake              355 ml  Output             1150 ml  Net             -795 ml   Filed Weights   10/27/15 0800  Weight: 202 lb (91.6 kg)     LABS: Basic Metabolic Panel:  Recent Labs  10/25/15 1319 10/27/15 1259 10/28/15 0428  NA 136  --  135  K 5.0  --  4.2  CL 100  --  102  CO2 29  --  27  GLUCOSE 286*  --  213*  BUN 12  --  11  CREATININE 1.06 0.82 0.97  CALCIUM 9.9  --  9.5   Liver Function Tests:  Recent Labs  10/28/15 0428  AST 24  ALT 25  ALKPHOS 67  BILITOT 0.8  PROT 7.1  ALBUMIN 4.0   No results for input(s): LIPASE, AMYLASE in the last 72 hours. CBC:  Recent Labs  10/25/15 1319 10/27/15 1259 10/28/15 0428  WBC 8.2 6.8 9.2  NEUTROABS 4,182  --   --   HGB 13.6 12.9* 14.0  HCT 40.2 38.6* 41.7  MCV 90.1 90.6 91.2  PLT 255 221 257   Cardiac Enzymes: No results for input(s): CKTOTAL, CKMB, CKMBINDEX, TROPONINI in the last 72 hours. BNP: Invalid input(s): POCBNP D-Dimer: No results for input(s):  DDIMER in the last 72 hours. Thyroid Function Tests: No results for input(s): TSH, T4TOTAL, T3FREE, THYROIDAB in the last 72 hours.  Invalid input(s): FREET3  RADIOLOGY: No results found.  PHYSICAL EXAM   Patient is oriented to person time and place. Affect is normal. His wife and another family member are in the room.   TELEMETRY: I have reviewed telemetry today, October 28, 2015. There is normal sinus rhythm.   ASSESSMENT AND PLAN:    Type II diabetes mellitus with neurological manifestations, uncontrolled ([place])      This is being treated.    Hyperlipidemia     This is being treated.    Essential hypertension     Earlier in the day yesterday blood pressure was elevated. On current medications it was under better control later in the day. No change in therapy today.  Aortic aneurysm ([place])     This will be treated at the time of bypass surgery    Coronary artery disease involving native heart with angina pectoris [facility])      The patient is scheduled for bypass surgery tomorrow.    Aortic stenosis      The patient is scheduled for bypass surgery tomorrow.    Headache     The patient has a headache this morning. He has not had a nitrate added. We will give him a mild medication for his headache.      Dola Argyle 10/28/2015 6:49 AM

## 2015-10-28 NOTE — Progress Notes (Signed)
Pre-op Cardiac Surgery  Carotid Findings:  Findings suggest 1-39% internal arteries are patent with antegrade flow. Vertebral arteries are patent with antegrade flow.   Upper Extremity Right Left  Brachial Pressures 125-Triphasic Triphasic  Radial Waveforms Triphasic Triphasic  Ulnar Waveforms Triphasic Triphasic  Palmar Arch (Allen's Test)      Lower  Extremity Right Left  Dorsalis Pedis Triphasic-127 Triphasic-128  Anterior Tibial    Posterior Tibial Triphasic-144 Monophasic-114  Ankle/Brachial Indices 1.15 1.02    Findings:   Bilateral ABIs are within normal limits.  10/28/2015 10:56 AM Maudry Mayhew, BS, RVT, RDCS, RDMS

## 2015-10-28 NOTE — Progress Notes (Signed)
  Echocardiogram 2D Echocardiogram has been performed.  Darlina Sicilian M 10/28/2015, 10:37 AM

## 2015-10-28 NOTE — Progress Notes (Signed)
CARDIAC REHAB PHASE I   PRE:  Rate/Rhythm: 68 SR  BP:  Supine: 122/88  Sitting:   Standing:    SaO2: 97%RA  MODE:  Ambulation: 400 ft   POST:  Rate/Rhythm: 70-73 SR  BP:  Supine:   Sitting: 128/90  Standing:    SaO2: 96%RA 1453-1515 Pt walked 400 ft with steady gait. No CP. Tolerated well. Discussed with pt how important it is to walk and use IS after surgery. He demonstrated 2500 ml on IS correctly. Discussed sternal precautions. He has [facility] booklet and care guide. Has seen pre op video. Family stated will be available 24/7 first week he is home. Will see after surgery.   Graylon Good, RN BSN  10/28/2015 3:11 PM

## 2015-10-29 ENCOUNTER — Inpatient Hospital Stay (HOSPITAL_COMMUNITY): Payer: Medicare Other | Admitting: Certified Registered Nurse Anesthetist

## 2015-10-29 ENCOUNTER — Inpatient Hospital Stay (HOSPITAL_COMMUNITY): Payer: Medicare Other

## 2015-10-29 ENCOUNTER — Encounter (HOSPITAL_COMMUNITY): Payer: Self-pay | Admitting: Certified Registered Nurse Anesthetist

## 2015-10-29 ENCOUNTER — Encounter (HOSPITAL_COMMUNITY)
Admission: AD | Disposition: A | Discharge status: Home Health | Payer: Self-pay | Source: Ambulatory Visit | Attending: Cardiothoracic Surgery

## 2015-10-29 DIAGNOSIS — I2510 Atherosclerotic heart disease of native coronary artery without angina pectoris: Secondary | ICD-10-CM

## 2015-10-29 DIAGNOSIS — I208 Other forms of angina pectoris: Secondary | ICD-10-CM

## 2015-10-29 DIAGNOSIS — R931 Abnormal findings on diagnostic imaging of heart and coronary circulation: Secondary | ICD-10-CM

## 2015-10-29 DIAGNOSIS — I712 Thoracic aortic aneurysm, without rupture: Secondary | ICD-10-CM

## 2015-10-29 DIAGNOSIS — I350 Nonrheumatic aortic (valve) stenosis: Secondary | ICD-10-CM

## 2015-10-29 HISTORY — PX: REPLACEMENT ASCENDING AORTA: SHX6068

## 2015-10-29 HISTORY — PX: AORTIC VALVE REPLACEMENT: SHX41

## 2015-10-29 HISTORY — PX: CORONARY ARTERY BYPASS GRAFT: SHX141

## 2015-10-29 HISTORY — PX: TEE WITHOUT CARDIOVERSION: SHX5443

## 2015-10-29 LAB — CBC
HCT: 39 % (ref 39.0–52.0)
HCT: 30.5 % — ABNORMAL LOW (ref 39.0–52.0)
HCT: 30.3 % — ABNORMAL LOW (ref 39.0–52.0)
Hemoglobin: 13.2 g/dL (ref 13.0–17.0)
Hemoglobin: 10.3 g/dL — ABNORMAL LOW (ref 13.0–17.0)
Hemoglobin: 10.2 g/dL — ABNORMAL LOW (ref 13.0–17.0)
MCH: 30.5 pg (ref 26.0–34.0)
MCH: 30.2 pg (ref 26.0–34.0)
MCH: 30.1 pg (ref 26.0–34.0)
MCHC: 33.8 g/dL (ref 30.0–36.0)
MCHC: 33.8 g/dL (ref 30.0–36.0)
MCHC: 33.7 g/dL (ref 30.0–36.0)
MCV: 90.1 fL (ref 78.0–100.0)
MCV: 89.4 fL (ref 78.0–100.0)
MCV: 89.4 fL (ref 78.0–100.0)
PLATELETS: 95 10*3/uL — AB (ref 150–400)
Platelets: 235 10*3/uL (ref 150–400)
Platelets: 100 10*3/uL — ABNORMAL LOW (ref 150–400)
RBC: 4.33 MIL/uL (ref 4.22–5.81)
RBC: 3.41 MIL/uL — ABNORMAL LOW (ref 4.22–5.81)
RBC: 3.39 MIL/uL — ABNORMAL LOW (ref 4.22–5.81)
RDW: 13 % (ref 11.5–15.5)
RDW: 12.9 % (ref 11.5–15.5)
RDW: 12.9 % (ref 11.5–15.5)
WBC: 6.7 10*3/uL (ref 4.0–10.5)
WBC: 10.2 10*3/uL (ref 4.0–10.5)
WBC: 11.4 10*3/uL — ABNORMAL HIGH (ref 4.0–10.5)

## 2015-10-29 LAB — POCT I-STAT 3, ART BLOOD GAS (G3+)
ACID-BASE EXCESS: 4 mmol/L — AB (ref 0.0–2.0)
Acid-base deficit: 2 mmol/L (ref 0.0–2.0)
BICARBONATE: 22.7 mmol/L (ref 20.0–28.0)
Bicarbonate: 28.5 mmol/L — ABNORMAL HIGH (ref 20.0–28.0)
O2 SAT: 100 %
O2 SAT: 99 %
PCO2 ART: 35.8 mmHg (ref 32.0–48.0)
PH ART: 7.406 (ref 7.350–7.450)
Patient temperature: 36.2
TCO2: 30 mmol/L (ref 0–100)
TCO2: 24 mmol/L (ref 0–100)
pCO2 arterial: 42.6 mmHg (ref 32.0–48.0)
pH, Arterial: 7.433 (ref 7.350–7.450)
pO2, Arterial: 350 mmHg — ABNORMAL HIGH (ref 83.0–108.0)
pO2, Arterial: 122 mmHg — ABNORMAL HIGH (ref 83.0–108.0)

## 2015-10-29 LAB — POCT I-STAT, CHEM 8
BUN: 11 mg/dL (ref 6–20)
BUN: 10 mg/dL (ref 6–20)
BUN: 8 mg/dL (ref 6–20)
BUN: 9 mg/dL (ref 6–20)
BUN: 8 mg/dL (ref 6–20)
BUN: 8 mg/dL (ref 6–20)
BUN: 7 mg/dL (ref 6–20)
BUN: 7 mg/dL (ref 6–20)
BUN: 6 mg/dL (ref 6–20)
BUN: 5 mg/dL — AB (ref 6–20)
CALCIUM ION: 1.23 mmol/L (ref 1.15–1.40)
CALCIUM ION: 1.02 mmol/L — AB (ref 1.15–1.40)
CALCIUM ION: 1.21 mmol/L (ref 1.15–1.40)
CALCIUM ION: 1.23 mmol/L (ref 1.15–1.40)
CALCIUM ION: 1.02 mmol/L — AB (ref 1.15–1.40)
CALCIUM ION: 1.04 mmol/L — AB (ref 1.15–1.40)
CALCIUM ION: 0.99 mmol/L — AB (ref 1.15–1.40)
CALCIUM ION: 0.92 mmol/L — AB (ref 1.15–1.40)
CHLORIDE: 101 mmol/L (ref 101–111)
CHLORIDE: 103 mmol/L (ref 101–111)
CHLORIDE: 101 mmol/L (ref 101–111)
CHLORIDE: 105 mmol/L (ref 101–111)
CHLORIDE: 105 mmol/L (ref 101–111)
CREATININE: 0.5 mg/dL — AB (ref 0.61–1.24)
CREATININE: 0.7 mg/dL (ref 0.61–1.24)
CREATININE: 0.6 mg/dL — AB (ref 0.61–1.24)
CREATININE: 0.5 mg/dL — AB (ref 0.61–1.24)
CREATININE: 0.6 mg/dL — AB (ref 0.61–1.24)
CREATININE: 0.6 mg/dL — AB (ref 0.61–1.24)
CREATININE: 0.7 mg/dL (ref 0.61–1.24)
Calcium, Ion: 1 mmol/L — ABNORMAL LOW (ref 1.15–1.40)
Calcium, Ion: 1.11 mmol/L — ABNORMAL LOW (ref 1.15–1.40)
Chloride: 102 mmol/L (ref 101–111)
Chloride: 96 mmol/L — ABNORMAL LOW (ref 101–111)
Chloride: 101 mmol/L (ref 101–111)
Chloride: 102 mmol/L (ref 101–111)
Chloride: 102 mmol/L (ref 101–111)
Creatinine, Ser: 0.5 mg/dL — ABNORMAL LOW (ref 0.61–1.24)
Creatinine, Ser: 0.7 mg/dL (ref 0.61–1.24)
Creatinine, Ser: 0.5 mg/dL — ABNORMAL LOW (ref 0.61–1.24)
GLUCOSE: 214 mg/dL — AB (ref 65–99)
GLUCOSE: 175 mg/dL — AB (ref 65–99)
GLUCOSE: 187 mg/dL — AB (ref 65–99)
GLUCOSE: 119 mg/dL — AB (ref 65–99)
GLUCOSE: 91 mg/dL (ref 65–99)
GLUCOSE: 93 mg/dL (ref 65–99)
GLUCOSE: 109 mg/dL — AB (ref 65–99)
Glucose, Bld: 140 mg/dL — ABNORMAL HIGH (ref 65–99)
Glucose, Bld: 101 mg/dL — ABNORMAL HIGH (ref 65–99)
Glucose, Bld: 166 mg/dL — ABNORMAL HIGH (ref 65–99)
HCT: 37 % — ABNORMAL LOW (ref 39.0–52.0)
HCT: 27 % — ABNORMAL LOW (ref 39.0–52.0)
HCT: 36 % — ABNORMAL LOW (ref 39.0–52.0)
HCT: 26 % — ABNORMAL LOW (ref 39.0–52.0)
HCT: 24 % — ABNORMAL LOW (ref 39.0–52.0)
HCT: 24 % — ABNORMAL LOW (ref 39.0–52.0)
HCT: 23 % — ABNORMAL LOW (ref 39.0–52.0)
HEMATOCRIT: 35 % — AB (ref 39.0–52.0)
HEMATOCRIT: 26 % — AB (ref 39.0–52.0)
HEMATOCRIT: 29 % — AB (ref 39.0–52.0)
HEMOGLOBIN: 11.9 g/dL — AB (ref 13.0–17.0)
HEMOGLOBIN: 8.2 g/dL — AB (ref 13.0–17.0)
HEMOGLOBIN: 9.9 g/dL — AB (ref 13.0–17.0)
Hemoglobin: 12.6 g/dL — ABNORMAL LOW (ref 13.0–17.0)
Hemoglobin: 12.2 g/dL — ABNORMAL LOW (ref 13.0–17.0)
Hemoglobin: 9.2 g/dL — ABNORMAL LOW (ref 13.0–17.0)
Hemoglobin: 8.8 g/dL — ABNORMAL LOW (ref 13.0–17.0)
Hemoglobin: 8.8 g/dL — ABNORMAL LOW (ref 13.0–17.0)
Hemoglobin: 8.2 g/dL — ABNORMAL LOW (ref 13.0–17.0)
Hemoglobin: 7.8 g/dL — ABNORMAL LOW (ref 13.0–17.0)
POTASSIUM: 3.6 mmol/L (ref 3.5–5.1)
POTASSIUM: 4.1 mmol/L (ref 3.5–5.1)
Potassium: 4 mmol/L (ref 3.5–5.1)
Potassium: 3.6 mmol/L (ref 3.5–5.1)
Potassium: 4 mmol/L (ref 3.5–5.1)
Potassium: 4 mmol/L (ref 3.5–5.1)
Potassium: 4.3 mmol/L (ref 3.5–5.1)
Potassium: 3.8 mmol/L (ref 3.5–5.1)
Potassium: 4.3 mmol/L (ref 3.5–5.1)
Potassium: 3.3 mmol/L — ABNORMAL LOW (ref 3.5–5.1)
SODIUM: 143 mmol/L (ref 135–145)
Sodium: 137 mmol/L (ref 135–145)
Sodium: 136 mmol/L (ref 135–145)
Sodium: 139 mmol/L (ref 135–145)
Sodium: 140 mmol/L (ref 135–145)
Sodium: 137 mmol/L (ref 135–145)
Sodium: 140 mmol/L (ref 135–145)
Sodium: 142 mmol/L (ref 135–145)
Sodium: 141 mmol/L (ref 135–145)
Sodium: 140 mmol/L (ref 135–145)
TCO2: 29 mmol/L (ref 0–100)
TCO2: 27 mmol/L (ref 0–100)
TCO2: 28 mmol/L (ref 0–100)
TCO2: 31 mmol/L (ref 0–100)
TCO2: 27 mmol/L (ref 0–100)
TCO2: 28 mmol/L (ref 0–100)
TCO2: 28 mmol/L (ref 0–100)
TCO2: 25 mmol/L (ref 0–100)
TCO2: 20 mmol/L (ref 0–100)
TCO2: 24 mmol/L (ref 0–100)

## 2015-10-29 LAB — ECHO INTRAOPERATIVE TEE
Height: 6 in
Weight: 3152 oz

## 2015-10-29 LAB — BLOOD GAS, ARTERIAL
Acid-Base Excess: 2.4 mmol/L — ABNORMAL HIGH (ref 0.0–2.0)
BICARBONATE: 26.8 mmol/L (ref 20.0–28.0)
Drawn by: 252031
FIO2: 0.21
O2 Saturation: 94.1 %
PH ART: 7.397 (ref 7.350–7.450)
PO2 ART: 76.1 mmHg — AB (ref 83.0–108.0)
Patient temperature: 98.6
pCO2 arterial: 44.5 mmHg (ref 32.0–48.0)

## 2015-10-29 LAB — BASIC METABOLIC PANEL
Anion gap: 8 (ref 5–15)
BUN: 10 mg/dL (ref 6–20)
CO2: 29 mmol/L (ref 22–32)
Calcium: 9.4 mg/dL (ref 8.9–10.3)
Chloride: 100 mmol/L — ABNORMAL LOW (ref 101–111)
Creatinine, Ser: 0.9 mg/dL (ref 0.61–1.24)
GFR calc Af Amer: >60 mL/min (ref >60)
GFR calc non Af Amer: >60 mL/min (ref >60)
Glucose, Bld: 217 mg/dL — ABNORMAL HIGH (ref 65–99)
Potassium: 4.2 mmol/L (ref 3.5–5.1)
Sodium: 137 mmol/L (ref 135–145)

## 2015-10-29 LAB — PROTIME-INR
INR: 1.94
PROTHROMBIN TIME: 22.4 s — AB (ref 11.4–15.2)

## 2015-10-29 LAB — MAGNESIUM: Magnesium: 2.6 mg/dL — ABNORMAL HIGH (ref 1.7–2.4)

## 2015-10-29 LAB — HEMOGLOBIN AND HEMATOCRIT, BLOOD
HCT: 23.8 % — ABNORMAL LOW (ref 39.0–52.0)
Hemoglobin: 8 g/dL — ABNORMAL LOW (ref 13.0–17.0)

## 2015-10-29 LAB — GLUCOSE, CAPILLARY
GLUCOSE-CAPILLARY: 108 mg/dL — AB (ref 65–99)
GLUCOSE-CAPILLARY: 119 mg/dL — AB (ref 65–99)
GLUCOSE-CAPILLARY: 130 mg/dL — AB (ref 65–99)
GLUCOSE-CAPILLARY: 142 mg/dL — AB (ref 65–99)
Glucose-Capillary: 210 mg/dL — ABNORMAL HIGH (ref 65–99)

## 2015-10-29 LAB — POCT I-STAT 4, (NA,K, GLUC, HGB,HCT)
GLUCOSE: 124 mg/dL — AB (ref 65–99)
HEMATOCRIT: 29 % — AB (ref 39.0–52.0)
HEMOGLOBIN: 9.9 g/dL — AB (ref 13.0–17.0)
Potassium: 3.6 mmol/L (ref 3.5–5.1)
Sodium: 145 mmol/L (ref 135–145)

## 2015-10-29 LAB — CREATININE, SERUM
Creatinine, Ser: 0.74 mg/dL (ref 0.61–1.24)
GFR calc Af Amer: >60 mL/min (ref >60)
GFR calc non Af Amer: >60 mL/min (ref >60)

## 2015-10-29 LAB — SURGICAL PCR SCREEN
MRSA, PCR: NEGATIVE
Staphylococcus aureus: NEGATIVE

## 2015-10-29 LAB — FIBRINOGEN: Fibrinogen: 175 mg/dL — ABNORMAL LOW (ref 210–475)

## 2015-10-29 LAB — PLATELET COUNT: Platelets: 141 10*3/uL — ABNORMAL LOW (ref 150–400)

## 2015-10-29 LAB — APTT: aPTT: 41 seconds — ABNORMAL HIGH (ref 24–36)

## 2015-10-29 SURGERY — CORONARY ARTERY BYPASS GRAFTING (CABG)
Anesthesia: General | Site: Chest

## 2015-10-29 MED ORDER — BISACODYL 5 MG PO TBEC
10.0000 mg | DELAYED_RELEASE_TABLET | Freq: Every day | ORAL | Status: DC
  Administered 2015-10-30 – 2015-11-01 (×3): 10 mg via ORAL
  Filled 2015-10-29 (×3): qty 2

## 2015-10-29 MED ORDER — HEPARIN SODIUM (PORCINE) 1000 UNIT/ML IJ SOLN
INTRAMUSCULAR | Status: DC | PRN
  Administered 2015-10-29: 28000 [IU] via INTRAVENOUS

## 2015-10-29 MED ORDER — DEXMEDETOMIDINE HCL IN NACL 200 MCG/50ML IV SOLN
0.0000 ug/kg/h | INTRAVENOUS | Status: DC
  Filled 2015-10-29 (×3): qty 50

## 2015-10-29 MED ORDER — FENTANYL CITRATE (PF) 250 MCG/5ML IJ SOLN
INTRAMUSCULAR | Status: DC | PRN
  Administered 2015-10-29 (×2): 100 ug via INTRAVENOUS
  Administered 2015-10-29: 50 ug via INTRAVENOUS
  Administered 2015-10-29: 150 ug via INTRAVENOUS
  Administered 2015-10-29 (×2): 100 ug via INTRAVENOUS
  Administered 2015-10-29 (×3): 150 ug via INTRAVENOUS
  Administered 2015-10-29: 200 ug via INTRAVENOUS

## 2015-10-29 MED ORDER — SODIUM CHLORIDE 0.9% FLUSH
3.0000 mL | Freq: Two times a day (BID) | INTRAVENOUS | Status: DC
Start: 2015-10-30 — End: 2015-11-01
  Administered 2015-10-30 – 2015-11-01 (×4): 3 mL via INTRAVENOUS

## 2015-10-29 MED ORDER — LACTATED RINGERS IV SOLN
INTRAVENOUS | Status: DC | PRN
  Administered 2015-10-29 (×2): via INTRAVENOUS

## 2015-10-29 MED ORDER — SODIUM CHLORIDE 0.9 % IV SOLN
INTRAVENOUS | Status: DC
  Administered 2015-10-29: 19:00:00 via INTRAVENOUS

## 2015-10-29 MED ORDER — OXYCODONE HCL 5 MG PO TABS
5.0000 mg | ORAL_TABLET | ORAL | Status: DC | PRN
  Administered 2015-10-30: 5 mg via ORAL
  Administered 2015-10-30 – 2015-11-01 (×3): 10 mg via ORAL
  Filled 2015-10-29 (×2): qty 2
  Filled 2015-10-29: qty 1
  Filled 2015-10-29: qty 2

## 2015-10-29 MED ORDER — BISACODYL 10 MG RE SUPP: 10.0000 mg | Freq: Every day | RECTAL | Status: DC

## 2015-10-29 MED ORDER — LACTATED RINGERS IV SOLN
INTRAVENOUS | Status: DC | PRN
  Administered 2015-10-29: 07:00:00 via INTRAVENOUS

## 2015-10-29 MED ORDER — PANTOPRAZOLE SODIUM 40 MG PO TBEC
40.0000 mg | DELAYED_RELEASE_TABLET | Freq: Every day | ORAL | Status: DC
  Administered 2015-10-31 – 2015-11-01 (×2): 40 mg via ORAL
  Filled 2015-10-29 (×2): qty 1

## 2015-10-29 MED ORDER — PROTAMINE SULFATE 10 MG/ML IV SOLN
INTRAVENOUS | Status: AC
  Filled 2015-10-29: qty 25

## 2015-10-29 MED ORDER — LACTATED RINGERS IV SOLN: 500.0000 mL | Freq: Once | INTRAVENOUS | Status: DC | PRN

## 2015-10-29 MED ORDER — 0.9 % SODIUM CHLORIDE (POUR BTL) OPTIME
TOPICAL | Status: DC | PRN
  Administered 2015-10-29: 1000 mL
  Administered 2015-10-29: 5000 mL
  Administered 2015-10-29 (×2): 1000 mL

## 2015-10-29 MED ORDER — PHENYLEPHRINE 40 MCG/ML (10ML) SYRINGE FOR IV PUSH (FOR BLOOD PRESSURE SUPPORT)
PREFILLED_SYRINGE | INTRAVENOUS | Status: AC
  Filled 2015-10-29: qty 10

## 2015-10-29 MED ORDER — CHLORHEXIDINE GLUCONATE 0.12 % MT SOLN
15.0000 mL | OROMUCOSAL | Status: AC
  Administered 2015-10-29: 15 mL via OROMUCOSAL

## 2015-10-29 MED ORDER — ROCURONIUM BROMIDE 10 MG/ML (PF) SYRINGE
PREFILLED_SYRINGE | INTRAVENOUS | Status: DC | PRN
  Administered 2015-10-29: 100 mg via INTRAVENOUS
  Administered 2015-10-29: 40 mg via INTRAVENOUS
  Administered 2015-10-29 (×4): 50 mg via INTRAVENOUS

## 2015-10-29 MED ORDER — ACETAMINOPHEN 160 MG/5ML PO SOLN: 1000.0000 mg | Freq: Four times a day (QID) | ORAL | Status: DC

## 2015-10-29 MED ORDER — MAGNESIUM SULFATE 4 GM/100ML IV SOLN
4.0000 g | Freq: Once | INTRAVENOUS | Status: AC
Start: 2015-10-29 — End: 2015-10-29
  Administered 2015-10-29: 4 g via INTRAVENOUS
  Filled 2015-10-29: qty 100

## 2015-10-29 MED ORDER — EPHEDRINE 5 MG/ML INJ
INTRAVENOUS | Status: AC
  Filled 2015-10-29: qty 10

## 2015-10-29 MED ORDER — TRAMADOL HCL 50 MG PO TABS
50.0000 mg | ORAL_TABLET | ORAL | Status: DC | PRN
  Administered 2015-10-31: 50 mg via ORAL
  Administered 2015-11-01: 100 mg via ORAL
  Filled 2015-10-29: qty 2
  Filled 2015-10-29 (×2): qty 1

## 2015-10-29 MED ORDER — CALCIUM CHLORIDE 10 % IV SOLN
INTRAVENOUS | Status: AC
  Filled 2015-10-29: qty 10

## 2015-10-29 MED ORDER — ONDANSETRON HCL 4 MG/2ML IJ SOLN
INTRAMUSCULAR | Status: AC
  Filled 2015-10-29: qty 6

## 2015-10-29 MED ORDER — SODIUM CHLORIDE 0.45 % IV SOLN
INTRAVENOUS | Status: DC | PRN
  Administered 2015-10-29: 17:00:00 via INTRAVENOUS

## 2015-10-29 MED ORDER — ONDANSETRON HCL 4 MG/2ML IJ SOLN
4.0000 mg | Freq: Four times a day (QID) | INTRAMUSCULAR | Status: DC | PRN
  Administered 2015-10-30: 4 mg via INTRAVENOUS
  Filled 2015-10-29: qty 2

## 2015-10-29 MED ORDER — NITROGLYCERIN 0.2 MG/ML ON CALL CATH LAB: INTRAVENOUS | Status: DC | PRN

## 2015-10-29 MED ORDER — MORPHINE SULFATE (PF) 2 MG/ML IV SOLN
2.0000 mg | INTRAVENOUS | Status: DC | PRN
  Administered 2015-10-29 (×2): 4 mg via INTRAVENOUS
  Administered 2015-10-30: 2 mg via INTRAVENOUS
  Administered 2015-10-30: 4 mg via INTRAVENOUS
  Administered 2015-10-30 (×3): 2 mg via INTRAVENOUS
  Administered 2015-10-30: 4 mg via INTRAVENOUS
  Administered 2015-10-30 (×3): 2 mg via INTRAVENOUS
  Administered 2015-10-30: 4 mg via INTRAVENOUS
  Administered 2015-10-31: 2 mg via INTRAVENOUS
  Filled 2015-10-29 (×2): qty 2
  Filled 2015-10-29 (×4): qty 1
  Filled 2015-10-29: qty 2
  Filled 2015-10-29 (×4): qty 1
  Filled 2015-10-29 (×2): qty 2

## 2015-10-29 MED ORDER — ORAL CARE MOUTH RINSE
15.0000 mL | Freq: Four times a day (QID) | OROMUCOSAL | Status: DC
  Administered 2015-10-30 – 2015-10-31 (×6): 15 mL via OROMUCOSAL

## 2015-10-29 MED ORDER — PHENYLEPHRINE HCL 10 MG/ML IJ SOLN
INTRAVENOUS | Status: DC | PRN
  Administered 2015-10-29: 50 ug/min via INTRAVENOUS

## 2015-10-29 MED ORDER — ATORVASTATIN CALCIUM 40 MG PO TABS
40.0000 mg | ORAL_TABLET | Freq: Every day | ORAL | Status: DC
  Administered 2015-10-30 – 2015-11-07 (×9): 40 mg via ORAL
  Filled 2015-10-29 (×9): qty 1

## 2015-10-29 MED ORDER — METOCLOPRAMIDE HCL 5 MG/ML IJ SOLN
10.0000 mg | Freq: Four times a day (QID) | INTRAMUSCULAR | Status: DC
  Administered 2015-10-29 – 2015-11-01 (×12): 10 mg via INTRAVENOUS
  Filled 2015-10-29 (×12): qty 2

## 2015-10-29 MED ORDER — DEXTROSE 5 % IV SOLN
1.5000 g | Freq: Two times a day (BID) | INTRAVENOUS | Status: AC
Start: 2015-10-29 — End: 2015-10-31
  Administered 2015-10-29 – 2015-10-30 (×3): 1.5 g via INTRAVENOUS
  Filled 2015-10-29 (×4): qty 1.5

## 2015-10-29 MED ORDER — PROPOFOL 10 MG/ML IV BOLUS
INTRAVENOUS | Status: DC | PRN
  Administered 2015-10-29: 20 mg via INTRAVENOUS
  Administered 2015-10-29: 10 mg via INTRAVENOUS
  Administered 2015-10-29: 20 mg via INTRAVENOUS

## 2015-10-29 MED ORDER — PHENYLEPHRINE HCL 10 MG/ML IJ SOLN
0.0000 ug/min | INTRAVENOUS | Status: DC
  Filled 2015-10-29: qty 2

## 2015-10-29 MED ORDER — MORPHINE SULFATE (PF) 2 MG/ML IV SOLN: 2.0000 mg | INTRAVENOUS | Status: DC | PRN

## 2015-10-29 MED ORDER — ACETAMINOPHEN 650 MG RE SUPP
650.0000 mg | Freq: Once | RECTAL | Status: AC
  Administered 2015-10-29: 650 mg via RECTAL

## 2015-10-29 MED ORDER — GLYCOPYRROLATE 0.2 MG/ML IV SOSY
PREFILLED_SYRINGE | INTRAVENOUS | Status: AC
  Filled 2015-10-29: qty 6

## 2015-10-29 MED ORDER — EPHEDRINE SULFATE-NACL 50-0.9 MG/10ML-% IV SOSY
PREFILLED_SYRINGE | INTRAVENOUS | Status: DC | PRN
  Administered 2015-10-29: 5 mg via INTRAVENOUS
  Administered 2015-10-29: 10 mg via INTRAVENOUS

## 2015-10-29 MED ORDER — NITROGLYCERIN 0.2 MG/ML ON CALL CATH LAB
INTRAVENOUS | Status: DC | PRN
  Administered 2015-10-29: 20 ug via INTRAVENOUS

## 2015-10-29 MED ORDER — ALBUMIN HUMAN 5 % IV SOLN
250.0000 mL | INTRAVENOUS | Status: AC | PRN
  Administered 2015-10-29 (×2): 250 mL via INTRAVENOUS
  Filled 2015-10-29: qty 250

## 2015-10-29 MED ORDER — HEPARIN SODIUM (PORCINE) 1000 UNIT/ML IJ SOLN
INTRAMUSCULAR | Status: AC
  Filled 2015-10-29: qty 1

## 2015-10-29 MED ORDER — FENTANYL CITRATE (PF) 250 MCG/5ML IJ SOLN
INTRAMUSCULAR | Status: AC
  Filled 2015-10-29: qty 25

## 2015-10-29 MED ORDER — SODIUM CHLORIDE 0.9 % IV SOLN: 250.0000 mL | INTRAVENOUS | Status: DC

## 2015-10-29 MED ORDER — GELATIN ABSORBABLE MT POWD
OROMUCOSAL | Status: DC | PRN
  Administered 2015-10-29: 12 mL via TOPICAL

## 2015-10-29 MED ORDER — INSULIN REGULAR BOLUS VIA INFUSION
0.0000 [IU] | Freq: Three times a day (TID) | INTRAVENOUS | Status: DC
  Filled 2015-10-29: qty 10

## 2015-10-29 MED ORDER — NITROGLYCERIN IN D5W 200-5 MCG/ML-% IV SOLN: 0.0000 ug/min | INTRAVENOUS | Status: DC

## 2015-10-29 MED ORDER — MIDAZOLAM HCL 5 MG/5ML IJ SOLN
INTRAMUSCULAR | Status: DC | PRN
  Administered 2015-10-29: 3 mg via INTRAVENOUS
  Administered 2015-10-29: 5 mg via INTRAVENOUS
  Administered 2015-10-29: 2 mg via INTRAVENOUS

## 2015-10-29 MED ORDER — ACETAMINOPHEN 500 MG PO TABS
1000.0000 mg | ORAL_TABLET | Freq: Four times a day (QID) | ORAL | Status: DC
  Administered 2015-10-30 – 2015-11-01 (×8): 1000 mg via ORAL
  Filled 2015-10-29 (×8): qty 2

## 2015-10-29 MED ORDER — THROMBIN 5000 UNITS EX SOLR
CUTANEOUS | Status: AC
  Filled 2015-10-29: qty 5000

## 2015-10-29 MED ORDER — SODIUM CHLORIDE 0.9% FLUSH: 3.0000 mL | INTRAVENOUS | Status: DC | PRN

## 2015-10-29 MED ORDER — MIDAZOLAM HCL 2 MG/2ML IJ SOLN: 2.0000 mg | INTRAMUSCULAR | Status: DC | PRN

## 2015-10-29 MED ORDER — CALCIUM CHLORIDE 10 % IV SOLN
INTRAVENOUS | Status: DC | PRN
  Administered 2015-10-29: .2 g via INTRAVENOUS

## 2015-10-29 MED ORDER — ROCURONIUM BROMIDE 100 MG/10ML IV SOLN: INTRAVENOUS | Status: DC | PRN

## 2015-10-29 MED ORDER — ASPIRIN EC 81 MG PO TBEC
81.0000 mg | DELAYED_RELEASE_TABLET | Freq: Every day | ORAL | Status: DC
  Administered 2015-10-30 – 2015-11-01 (×3): 81 mg via ORAL
  Filled 2015-10-29 (×3): qty 1

## 2015-10-29 MED ORDER — CHLORHEXIDINE GLUCONATE 0.12% ORAL RINSE (MEDLINE KIT)
15.0000 mL | Freq: Two times a day (BID) | OROMUCOSAL | Status: DC
  Administered 2015-10-30 (×2): 15 mL via OROMUCOSAL

## 2015-10-29 MED ORDER — PROTAMINE SULFATE 10 MG/ML IV SOLN
INTRAVENOUS | Status: DC | PRN
  Administered 2015-10-29: 25 mg via INTRAVENOUS
  Administered 2015-10-29: 250 mg via INTRAVENOUS

## 2015-10-29 MED ORDER — METOPROLOL TARTRATE 12.5 MG HALF TABLET
12.5000 mg | ORAL_TABLET | Freq: Two times a day (BID) | ORAL | Status: DC
  Administered 2015-10-30 – 2015-11-01 (×4): 12.5 mg via ORAL
  Filled 2015-10-29 (×4): qty 1

## 2015-10-29 MED ORDER — VANCOMYCIN HCL IN DEXTROSE 1-5 GM/200ML-% IV SOLN
1000.0000 mg | Freq: Once | INTRAVENOUS | Status: AC
  Administered 2015-10-29: 1000 mg via INTRAVENOUS
  Filled 2015-10-29: qty 200

## 2015-10-29 MED ORDER — LACTATED RINGERS IV SOLN: INTRAVENOUS | Status: DC

## 2015-10-29 MED ORDER — ROCURONIUM BROMIDE 10 MG/ML (PF) SYRINGE
PREFILLED_SYRINGE | INTRAVENOUS | Status: AC
  Filled 2015-10-29: qty 40

## 2015-10-29 MED ORDER — METOPROLOL TARTRATE 25 MG/10 ML ORAL SUSPENSION: 12.5000 mg | Freq: Two times a day (BID) | ORAL | Status: DC

## 2015-10-29 MED ORDER — ACETAMINOPHEN 160 MG/5ML PO SOLN: 650.0000 mg | Freq: Once | ORAL | Status: AC

## 2015-10-29 MED ORDER — ALBUMIN HUMAN 5 % IV SOLN
INTRAVENOUS | Status: DC | PRN
  Administered 2015-10-29 (×3): via INTRAVENOUS

## 2015-10-29 MED ORDER — MORPHINE SULFATE (PF) 2 MG/ML IV SOLN: 1.0000 mg | INTRAVENOUS | Status: AC | PRN

## 2015-10-29 MED ORDER — EPHEDRINE SULFATE 50 MG/ML IJ SOLN: INTRAMUSCULAR | Status: DC | PRN

## 2015-10-29 MED ORDER — HEMOSTATIC AGENTS (NO CHARGE) OPTIME
TOPICAL | Status: DC | PRN
  Administered 2015-10-29 (×3): 1 via TOPICAL

## 2015-10-29 MED ORDER — GLYCOPYRROLATE 0.2 MG/ML IJ SOLN
INTRAMUSCULAR | Status: DC | PRN
  Administered 2015-10-29: 0.1 mg via INTRAVENOUS

## 2015-10-29 MED ORDER — PROPOFOL 10 MG/ML IV BOLUS
INTRAVENOUS | Status: AC
  Filled 2015-10-29: qty 20

## 2015-10-29 MED ORDER — METOPROLOL TARTRATE 5 MG/5ML IV SOLN: 2.5000 mg | INTRAVENOUS | Status: DC | PRN

## 2015-10-29 MED ORDER — MIDAZOLAM HCL 10 MG/2ML IJ SOLN
INTRAMUSCULAR | Status: AC
  Filled 2015-10-29: qty 2

## 2015-10-29 MED ORDER — PROTAMINE SULFATE 10 MG/ML IV SOLN
INTRAVENOUS | Status: AC
  Filled 2015-10-29: qty 5

## 2015-10-29 MED ORDER — FAMOTIDINE IN NACL 20-0.9 MG/50ML-% IV SOLN
20.0000 mg | Freq: Two times a day (BID) | INTRAVENOUS | Status: AC
  Administered 2015-10-29 (×2): 20 mg via INTRAVENOUS
  Filled 2015-10-29: qty 50

## 2015-10-29 MED ORDER — POTASSIUM CHLORIDE 10 MEQ/50ML IV SOLN
10.0000 meq | INTRAVENOUS | Status: AC
  Administered 2015-10-29 (×3): 10 meq via INTRAVENOUS

## 2015-10-29 MED ORDER — LEVALBUTEROL HCL 0.63 MG/3ML IN NEBU
0.6300 mg | INHALATION_SOLUTION | Freq: Three times a day (TID) | RESPIRATORY_TRACT | Status: DC
  Administered 2015-10-29: 0.63 mg via RESPIRATORY_TRACT
  Filled 2015-10-29: qty 3

## 2015-10-29 MED ORDER — SODIUM CHLORIDE 0.9 % IV SOLN
INTRAVENOUS | Status: AC
  Filled 2015-10-29: qty 2.5

## 2015-10-29 MED ORDER — DOCUSATE SODIUM 100 MG PO CAPS
200.0000 mg | ORAL_CAPSULE | Freq: Every day | ORAL | Status: DC
  Administered 2015-10-30 – 2015-11-01 (×3): 200 mg via ORAL
  Filled 2015-10-29 (×3): qty 2

## 2015-10-29 MED FILL — Magnesium Sulfate Inj 50%: INTRAMUSCULAR | Qty: 10 | Status: AC

## 2015-10-29 MED FILL — Heparin Sodium (Porcine) Inj 1000 Unit/ML: INTRAMUSCULAR | Qty: 30 | Status: AC

## 2015-10-29 MED FILL — Potassium Chloride Inj 2 mEq/ML: INTRAVENOUS | Qty: 40 | Status: AC

## 2015-10-29 SURGICAL SUPPLY — 115 items
ADAPTER CARDIO PERF ANTE/RETRO (ADAPTER) ×4 IMPLANT
ADPR PRFSN 84XANTGRD RTRGD (ADAPTER) ×4
APL SRG 7X2 LUM MLBL SLNT (VASCULAR PRODUCTS) ×4
APPLICATOR COTTON TIP 6IN STRL (MISCELLANEOUS) IMPLANT
APPLICATOR TIP COSEAL (VASCULAR PRODUCTS) ×2 IMPLANT
BAG DECANTER FOR FLEXI CONT (MISCELLANEOUS) ×3 IMPLANT
BANDAGE ACE 4X5 VEL STRL LF (GAUZE/BANDAGES/DRESSINGS) ×3 IMPLANT
BANDAGE ACE 6X5 VEL STRL LF (GAUZE/BANDAGES/DRESSINGS) ×3 IMPLANT
BLADE CLIPPER SURG (BLADE) ×1 IMPLANT
BLADE STERNUM SYSTEM 6 (BLADE) ×3 IMPLANT
BLADE SURG 15 STRL LF DISP TIS (BLADE) ×2 IMPLANT
BLADE SURG 15 STRL SS (BLADE) ×3
BNDG GAUZE ELAST 4 BULKY (GAUZE/BANDAGES/DRESSINGS) ×3 IMPLANT
BOOT SUTURE AID YELLOW STND (SUTURE) IMPLANT
CANISTER SUCTION 2500CC (MISCELLANEOUS) ×3 IMPLANT
CANNULA GUNDRY RCSP 15FR (MISCELLANEOUS) ×3 IMPLANT
CATH CPB KIT GERHARDT (MISCELLANEOUS) ×3 IMPLANT
CATH FOLEY 2WAY SLVR 18FR 30CC (CATHETERS) IMPLANT
CATH HEART VENT LEFT (CATHETERS) ×2 IMPLANT
CATH THORACIC 28FR (CATHETERS) ×3 IMPLANT
CATH/SQUID NICHOLS JEHLE COR (CATHETERS) IMPLANT
CAUTERY SURG HI TEMP FINE TIP (MISCELLANEOUS) ×1 IMPLANT
CLSR STERI-STRIP ANTIMIC 1/2X4 (GAUZE/BANDAGES/DRESSINGS) ×1 IMPLANT
CONN ST 1/4X3/8  BEN (MISCELLANEOUS) ×2
CONN ST 1/4X3/8 BEN (MISCELLANEOUS) IMPLANT
CONT SPEC 4OZ CLIKSEAL STRL BL (MISCELLANEOUS) ×2 IMPLANT
CRADLE DONUT ADULT HEAD (MISCELLANEOUS) ×3 IMPLANT
DRAIN CHANNEL 28F RND 3/8 FF (WOUND CARE) ×4 IMPLANT
DRAPE CARDIOVASCULAR INCISE (DRAPES) ×3
DRAPE SLUSH/WARMER DISC (DRAPES) ×3 IMPLANT
DRAPE SRG 135X102X78XABS (DRAPES) ×2 IMPLANT
DRSG AQUACEL AG ADV 3.5X14 (GAUZE/BANDAGES/DRESSINGS) ×3 IMPLANT
ELECT BLADE 4.0 EZ CLEAN MEGAD (MISCELLANEOUS) ×3
ELECT CAUTERY BLADE 6.4 (BLADE) ×3 IMPLANT
ELECT REM PT RETURN 9FT ADLT (ELECTROSURGICAL) ×6
ELECTRODE BLDE 4.0 EZ CLN MEGD (MISCELLANEOUS) ×2 IMPLANT
ELECTRODE REM PT RTRN 9FT ADLT (ELECTROSURGICAL) ×4 IMPLANT
FELT TEFLON 1X6 (MISCELLANEOUS) ×6 IMPLANT
GAUZE SPONGE 4X4 12PLY STRL (GAUZE/BANDAGES/DRESSINGS) ×6 IMPLANT
GLOVE BIO SURGEON STRL SZ 6.5 (GLOVE) ×12 IMPLANT
GLOVE BIO SURGEON STRL SZ7.5 (GLOVE) ×3 IMPLANT
GLOVE BIOGEL PI IND STRL 6 (GLOVE) IMPLANT
GLOVE BIOGEL PI IND STRL 6.5 (GLOVE) IMPLANT
GLOVE BIOGEL PI INDICATOR 6 (GLOVE) ×2
GLOVE BIOGEL PI INDICATOR 6.5 (GLOVE) ×1
GLOVE SURG SS PI 6.0 STRL IVOR (GLOVE) ×1 IMPLANT
GOWN STRL REUS W/ TWL LRG LVL3 (GOWN DISPOSABLE) ×8 IMPLANT
GOWN STRL REUS W/TWL LRG LVL3 (GOWN DISPOSABLE) ×12
GRAFT WOVEN D/V 34DX30L (Vascular Products) ×1 IMPLANT
HEMOSTAT POWDER SURGIFOAM 1G (HEMOSTASIS) ×9 IMPLANT
HEMOSTAT SURGICEL 2X14 (HEMOSTASIS) ×3 IMPLANT
INSERT FOGARTY XLG (MISCELLANEOUS) ×3 IMPLANT
KIT BASIN OR (CUSTOM PROCEDURE TRAY) ×3 IMPLANT
KIT CATH SUCT 8FR (CATHETERS) ×3 IMPLANT
KIT ROOM TURNOVER OR (KITS) ×3 IMPLANT
KIT SUCTION CATH 14FR (SUCTIONS) ×9 IMPLANT
KIT VASOVIEW HEMOPRO VH 3000 (KITS) ×3 IMPLANT
LEAD PACING MYOCARDI (MISCELLANEOUS) ×3 IMPLANT
LINE VENT (MISCELLANEOUS) ×1 IMPLANT
MARKER GRAFT CORONARY BYPASS (MISCELLANEOUS) ×9 IMPLANT
NDL 18GX1X1/2 (RX/OR ONLY) (NEEDLE) IMPLANT
NDL AORTIC AIR ASPIRATING (NEEDLE) IMPLANT
NEEDLE 18GX1X1/2 (RX/OR ONLY) (NEEDLE) ×3 IMPLANT
NEEDLE AORTIC AIR ASPIRATING (NEEDLE) ×3 IMPLANT
NS IRRIG 1000ML POUR BTL (IV SOLUTION) ×15 IMPLANT
PACK OPEN HEART (CUSTOM PROCEDURE TRAY) ×3 IMPLANT
PAD ARMBOARD 7.5X6 YLW CONV (MISCELLANEOUS) ×6 IMPLANT
PAD ELECT DEFIB RADIOL ZOLL (MISCELLANEOUS) ×3 IMPLANT
PENCIL BUTTON HOLSTER BLD 10FT (ELECTRODE) ×3 IMPLANT
PUNCH AORTIC ROTATE  4.5MM 8IN (MISCELLANEOUS) ×1 IMPLANT
SEALANT SURG COSEAL 4ML (VASCULAR PRODUCTS) ×1 IMPLANT
SEALANT SURG COSEAL 8ML (VASCULAR PRODUCTS) ×3 IMPLANT
SET CARDIOPLEGIA MPS 5001102 (MISCELLANEOUS) ×1 IMPLANT
SOLUTION ANTI FOG 6CC (MISCELLANEOUS) ×1 IMPLANT
SPONGE GAUZE 4X4 12PLY STER LF (GAUZE/BANDAGES/DRESSINGS) ×2 IMPLANT
SPONGE LAP 18X18 X RAY DECT (DISPOSABLE) ×2 IMPLANT
SPONGE LAP 4X18 X RAY DECT (DISPOSABLE) ×2 IMPLANT
SUT BONE WAX W31G (SUTURE) ×3 IMPLANT
SUT ETHIBON 2 0 V 52N 30 (SUTURE) ×6 IMPLANT
SUT ETHIBOND 2 0 SH (SUTURE) ×3
SUT ETHIBOND 2 0 SH 36X2 (SUTURE) ×2 IMPLANT
SUT PROLENE 3 0 RB 1 (SUTURE) ×3 IMPLANT
SUT PROLENE 3 0 SH 48 (SUTURE) IMPLANT
SUT PROLENE 3 0 SH1 36 (SUTURE) ×12 IMPLANT
SUT PROLENE 4 0 RB 1 (SUTURE) ×6
SUT PROLENE 4 0 TF (SUTURE) ×6 IMPLANT
SUT PROLENE 4-0 RB1 .5 CRCL 36 (SUTURE) ×4 IMPLANT
SUT PROLENE 5 0 C 1 36 (SUTURE) ×1 IMPLANT
SUT PROLENE 6 0 C 1 30 (SUTURE) ×1 IMPLANT
SUT PROLENE 6 0 CC (SUTURE) ×6 IMPLANT
SUT PROLENE 7 0 BV1 MDA (SUTURE) ×3 IMPLANT
SUT SILK 2 0 SH CR/8 (SUTURE) ×1 IMPLANT
SUT STEEL 6MS V (SUTURE) ×3 IMPLANT
SUT STEEL SZ 6 DBL 3X14 BALL (SUTURE) ×3 IMPLANT
SUT VIC AB 1 CTX 18 (SUTURE) ×6 IMPLANT
SUT VIC AB 2-0 CT1 27 (SUTURE) ×3
SUT VIC AB 2-0 CT1 TAPERPNT 27 (SUTURE) IMPLANT
SUT VIC AB 3-0 X1 27 (SUTURE) ×1 IMPLANT
SUTURE E-PAK OPEN HEART (SUTURE) ×3 IMPLANT
SYRINGE 10CC LL (SYRINGE) ×1 IMPLANT
SYSTEM SAHARA CHEST DRAIN ATS (WOUND CARE) ×3 IMPLANT
TAPE CLOTH SURG 4X10 WHT LF (GAUZE/BANDAGES/DRESSINGS) ×1 IMPLANT
TAPE PAPER 2X10 WHT MICROPORE (GAUZE/BANDAGES/DRESSINGS) ×1 IMPLANT
TAPE PAPER 3X10 WHT MICROPORE (GAUZE/BANDAGES/DRESSINGS) ×1 IMPLANT
TOWEL OR 17X24 6PK STRL BLUE (TOWEL DISPOSABLE) ×6 IMPLANT
TOWEL OR 17X26 10 PK STRL BLUE (TOWEL DISPOSABLE) ×6 IMPLANT
TRAY FOLEY IC TEMP SENS 16FR (CATHETERS) ×3 IMPLANT
TRAY FOLEY SILVER 14FR TEMP (SET/KITS/TRAYS/PACK) ×1 IMPLANT
TUBE CONNECTING 12X1/4 (SUCTIONS) ×1 IMPLANT
TUBING INSUFFLATION (TUBING) ×3 IMPLANT
UNDERPAD 30X30 (UNDERPADS AND DIAPERS) ×3 IMPLANT
VALVE MAGNA EASE AORTIC 23MM (Prosthesis & Implant Heart) ×1 IMPLANT
VENT LEFT HEART 12002 (CATHETERS) ×3
WATER STERILE IRR 1000ML POUR (IV SOLUTION) ×6 IMPLANT
YANKAUER SUCT BULB TIP NO VENT (SUCTIONS) ×1 IMPLANT

## 2015-10-29 NOTE — Progress Notes (Signed)
CT surgery p.m. Rounds  Patient stable after a biologic Bentall procedure with CABG 4 Stable hemodynamics Oxygenation satisfactory Chest tube output 50 cc/h Continue to follow vent weaning protocol Continue low-dose dopamine to maintain index

## 2015-10-29 NOTE — Transfer of Care (Signed)
Immediate Anesthesia Transfer of Care Note  Patient: [name]  Procedure(s) Performed: Procedure(s): CORONARY ARTERY BYPASS GRAFTING (CABG) x Four UTILIZING THE LEFT INTERNAL MAMMARY ARTERY AND ENDOSCOPICALLY HARVESTED RIGHT SAPEHENEOUS VEINS. (N/A) AORTIC VALVE REPLACEMENT (AVR) WITH 23MM MAGNA EASE TISSUE VALVE. (N/A) REPLACEMENT OF ASCENDING AORTA USING 34MM X 30CM WOVEN DOUBLE VELOUR VASCULAR GRAFT. (N/A) TRANSESOPHAGEAL ECHOCARDIOGRAM (TEE) (N/A)  Patient Location: SICU  Anesthesia Type:General  Level of Consciousness: sedated, unresponsive and Patient remains intubated per anesthesia plan  Airway & Oxygen Therapy: Patient remains intubated per anesthesia plan and Patient placed on Ventilator (see vital sign flow sheet for setting)  Post-op Assessment: Report given to RN and Post -op Vital signs reviewed and stable  Post vital signs: Reviewed and stable  Last Vitals:  Vitals:   10/29/15 0433 10/29/15 1704  BP: (!) 141/67 (!) 90/57  Pulse: 60 90  Resp: 18 19  Temp: 36.6 C     Last Pain:  Vitals:   10/29/15 0433  TempSrc: Oral  PainSc:       Patients Stated Pain Goal: 3 ([id number])  Complications: No apparent anesthesia complications

## 2015-10-29 NOTE — Progress Notes (Signed)
Weaning terminated due to increased respirations and very low tidal volumes (150s-190s). RN aware, will attempt wean again.

## 2015-10-29 NOTE — Anesthesia Procedure Notes (Signed)
Procedure Name: Intubation Date/Time: 10/29/2015 7:54 AM Performed by: Everlean Cherry A Pre-anesthesia Checklist: Patient identified, Emergency Drugs available, Suction available and Patient being monitored Patient Re-evaluated:Patient Re-evaluated prior to inductionOxygen Delivery Method: Circle system utilized Preoxygenation: Pre-oxygenation with 100% oxygen Intubation Type: IV induction Ventilation: Mask ventilation without difficulty and Oral airway inserted - appropriate to patient size Laryngoscope Size: Sabra Heck and 2 Grade View: Grade I Tube type: Oral Tube size: 8.0 mm Number of attempts: 1 Airway Equipment and Method: Stylet Placement Confirmation: ETT inserted through vocal cords under direct vision,  positive ETCO2 and breath sounds checked- equal and bilateral Secured at: 23 cm Tube secured with: Tape Dental Injury: Teeth and Oropharynx as per pre-operative assessment

## 2015-10-29 NOTE — Anesthesia Procedure Notes (Signed)
Central Venous Catheter Insertion Performed by: anesthesiologist Patient location: Pre-op. Preanesthetic checklist: patient identified, IV checked, site marked, risks and benefits discussed, surgical consent, monitors and equipment checked, pre-op evaluation, timeout performed and anesthesia consent Position: Trendelenburg Lidocaine 1% used for infiltration Landmarks identified and Seldinger technique used Catheter size: 9 Fr MAC introducer Procedure performed using ultrasound guided technique. Attempts: 1 Following insertion, line sutured and dressing applied. Post procedure assessment: blood return through all ports, free fluid flow and no air. Patient tolerated the procedure well with no immediate complications.

## 2015-10-29 NOTE — Progress Notes (Signed)
Rapid Wean Protocol begun, RT at bedside monitoring. Patient is tolerating well at this time.

## 2015-10-29 NOTE — Anesthesia Preprocedure Evaluation (Signed)
Anesthesia Evaluation  Patient identified by MRN, date of birth, ID band Patient awake    Reviewed: Allergy & Precautions, NPO status , Patient's Chart, lab work & pertinent test results  History of Anesthesia Complications Negative for: history of anesthetic complications  Airway Mallampati: II  TM Distance: >3 FB Neck ROM: Full    Dental  (+) Dental Advisory Given   Pulmonary neg pulmonary ROS,    breath sounds clear to auscultation       Cardiovascular hypertension, + angina + CAD and + Peripheral Vascular Disease  + Valvular Problems/Murmurs AS  Rhythm:Regular + Systolic murmurs    Neuro/Psych  Neuromuscular disease negative psych ROS   GI/Hepatic negative GI ROS, Neg liver ROS,   Endo/Other  diabetes, Type 2, Insulin Dependent  Renal/GU negative Renal ROS     Musculoskeletal   Abdominal   Peds  Hematology   Anesthesia Other Findings   Reproductive/Obstetrics                             Anesthesia Physical Anesthesia Plan  ASA: IV  Anesthesia Plan: General   Post-op Pain Management:    Induction: Intravenous  Airway Management Planned: Oral ETT  Additional Equipment: Arterial line, TEE, CVP, PA Cath and Ultrasound Guidance Line Placement  Intra-op Plan: Utilization of Controlled Hypotension per surrgeon request, Utilization Of Total Body Hypothermia per surgeon request and Delibrate Circulatory arrest per surgeon request  Post-operative Plan: Post-operative intubation/ventilation  Informed Consent: I have reviewed the patients History and Physical, chart, labs and discussed the procedure including the risks, benefits and alternatives for the proposed anesthesia with the patient or authorized representative who has indicated his/her understanding and acceptance.   Dental advisory given  Plan Discussed with: CRNA and Surgeon  Anesthesia Plan Comments:          Anesthesia Quick Evaluation

## 2015-10-29 NOTE — Progress Notes (Signed)
Inpatient Diabetes Program Recommendations  AACE/ADA: New Consensus Statement on Inpatient Glycemic Control (2015)  Target Ranges:  Prepandial:   less than 140 mg/dL      Peak postprandial:   less than 180 mg/dL (1-2 hours)      Critically ill patients:  140 - 180 mg/dL   Results for [name], [name] (MRN [id number]) as of 10/29/2015 11:40  Ref. Range 10/28/2015 08:44 10/28/2015 11:23 10/28/2015 16:21 10/28/2015 20:47 10/29/2015 05:19  Glucose-Capillary Latest Ref Range: 65 - 99 mg/dL 239 (H) 281 (H) 162 (H) 274 (H) 210 (H)   Review of Glycemic Control  Diabetes history: DM2 Outpatient Diabetes medications: Lantus 25 units hs + Meal coverage 5-10 units tid + Metformin 1 gm bid Current orders for Inpatient glycemic control: Lantus 20 units + Novolog correction 0-15 units tid  Inpatient Diabetes Program Recommendations:   Please consider ordering Novolog 3 units meal coverage and Increase basal insulin to Lantus 25 units, glucose is in the 200's.   Thanks,  Tama Headings RN, MSN, [facility] Inpatient Diabetes Coordinator Team Pager [phone] (8a-5p)

## 2015-10-29 NOTE — Anesthesia Procedure Notes (Signed)
Central Venous Catheter Insertion Performed by: anesthesiologist Patient location: Pre-op. Preanesthetic checklist: patient identified, IV checked, site marked, risks and benefits discussed, surgical consent, monitors and equipment checked, pre-op evaluation, timeout performed and anesthesia consent Landmarks identified PA cath was placed.Swan type and PA catheter depth:thermodilation and 49PA Cath depth:49 Procedure performed without using ultrasound guided technique. Attempts: 1 Patient tolerated the procedure well with no immediate complications.

## 2015-10-29 NOTE — Progress Notes (Signed)
Intraoperative TEE has been performed. 

## 2015-10-29 NOTE — Brief Op Note (Addendum)
      [address]       [place] [address]             [phone]       10/29/2015  4:53 PM  PATIENT:  [name]  70 y.o. male  PRE-OPERATIVE DIAGNOSIS:  CAD AS, CAD extensive pleural plaque Dilated ascending aorta  AORTA  POST-OPERATIVE DIAGNOSIS:  CAD AS, CAD  PROCEDURE:  Procedure(s): CORONARY ARTERY BYPASS GRAFTING (CABG) x Four UTILIZING THE LEFT INTERNAL MAMMARY ARTERY AND ENDOSCOPICALLY HARVESTED RIGHT SAPEHENEOUS VEINS. (N/A) AORTIC VALVE REPLACEMENT (AVR) WITH 23MM MAGNA EASE TISSUE VALVE. (N/A) REPLACEMENT OF ASCENDING AORTA USING 34MM X 30CM WOVEN DOUBLE VELOUR VASCULAR GRAFT. (N/A) TRANSESOPHAGEAL ECHOCARDIOGRAM (TEE) (N/A)  SURGEON:  Surgeon(s) and Role:    * [name], MD - Primary  PHYSICIAN ASSISTANT:   Nicholes Rough, PA-C   ANESTHESIA:   general  EBL:  Total I/O In: 3490 [I.V.:1950; Blood:1040; IV Piggyback:500] Out: 5320 [Urine:3580; Blood:1740]  BLOOD ADMINISTERED:none  DRAINS: routine   LOCAL MEDICATIONS USED:  NONE  SPECIMEN:  Aorta   DISPOSITION OF SPECIMEN:  N/A  COUNTS:  YES  DICTATION: .Other Dictation: Dictation Number pending  PLAN OF CARE: Admit for overnight observation  PATIENT DISPOSITION:  ICU - intubated and hemodynamically stable.

## 2015-10-29 NOTE — Anesthesia Postprocedure Evaluation (Signed)
Anesthesia Post Note  Patient: [name]  Procedure(s) Performed: Procedure(s) (LRB): CORONARY ARTERY BYPASS GRAFTING (CABG) x Four UTILIZING THE LEFT INTERNAL MAMMARY ARTERY AND ENDOSCOPICALLY HARVESTED RIGHT SAPEHENEOUS VEINS. (N/A) AORTIC VALVE REPLACEMENT (AVR) WITH 23MM MAGNA EASE TISSUE VALVE. (N/A) REPLACEMENT OF ASCENDING AORTA USING 34MM X 30CM WOVEN DOUBLE VELOUR VASCULAR GRAFT. (N/A) TRANSESOPHAGEAL ECHOCARDIOGRAM (TEE) (N/A)  Patient location during evaluation: ICU Anesthesia Type: General Level of consciousness: sedated Pain management: pain level controlled Vital Signs Assessment: post-procedure vital signs reviewed and stable Respiratory status: patient remains intubated per anesthesia plan Cardiovascular status: stable Postop Assessment: no signs of nausea or vomiting Anesthetic complications: no    Last Vitals:  Vitals:   10/29/15 1830 10/29/15 1833  BP:    Pulse: 89 89  Resp: 12 12  Temp: 36.2 C 36.2 C    Last Pain:  Vitals:   10/29/15 0433  TempSrc: Oral  PainSc:                  Maicy Filip

## 2015-10-30 ENCOUNTER — Inpatient Hospital Stay (HOSPITAL_COMMUNITY): Payer: Medicare Other

## 2015-10-30 LAB — GLUCOSE, CAPILLARY
GLUCOSE-CAPILLARY: 109 mg/dL — AB (ref 65–99)
GLUCOSE-CAPILLARY: 97 mg/dL (ref 65–99)
GLUCOSE-CAPILLARY: 115 mg/dL — AB (ref 65–99)
GLUCOSE-CAPILLARY: 125 mg/dL — AB (ref 65–99)
GLUCOSE-CAPILLARY: 175 mg/dL — AB (ref 65–99)
GLUCOSE-CAPILLARY: 191 mg/dL — AB (ref 65–99)
Glucose-Capillary: 103 mg/dL — ABNORMAL HIGH (ref 65–99)
Glucose-Capillary: 104 mg/dL — ABNORMAL HIGH (ref 65–99)
Glucose-Capillary: 104 mg/dL — ABNORMAL HIGH (ref 65–99)
Glucose-Capillary: 114 mg/dL — ABNORMAL HIGH (ref 65–99)
Glucose-Capillary: 116 mg/dL — ABNORMAL HIGH (ref 65–99)
Glucose-Capillary: 130 mg/dL — ABNORMAL HIGH (ref 65–99)
Glucose-Capillary: 146 mg/dL — ABNORMAL HIGH (ref 65–99)

## 2015-10-30 LAB — CBC
HCT: 27 % — ABNORMAL LOW (ref 39.0–52.0)
HEMATOCRIT: 29.6 % — AB (ref 39.0–52.0)
Hemoglobin: 9.9 g/dL — ABNORMAL LOW (ref 13.0–17.0)
Hemoglobin: 9 g/dL — ABNORMAL LOW (ref 13.0–17.0)
MCH: 29.8 pg (ref 26.0–34.0)
MCH: 30 pg (ref 26.0–34.0)
MCHC: 33.4 g/dL (ref 30.0–36.0)
MCHC: 33.3 g/dL (ref 30.0–36.0)
MCV: 89.2 fL (ref 78.0–100.0)
MCV: 90 fL (ref 78.0–100.0)
PLATELETS: 105 10*3/uL — AB (ref 150–400)
PLATELETS: 100 10*3/uL — AB (ref 150–400)
RBC: 3.32 MIL/uL — AB (ref 4.22–5.81)
RBC: 3 MIL/uL — ABNORMAL LOW (ref 4.22–5.81)
RDW: 12.9 % (ref 11.5–15.5)
RDW: 13.5 % (ref 11.5–15.5)
WBC: 10.8 10*3/uL — ABNORMAL HIGH (ref 4.0–10.5)
WBC: 15 10*3/uL — AB (ref 4.0–10.5)

## 2015-10-30 LAB — POCT I-STAT 3, VENOUS BLOOD GAS (G3P V)
ACID-BASE DEFICIT: 2 mmol/L (ref 0.0–2.0)
BICARBONATE: 23 mmol/L (ref 20.0–28.0)
O2 Saturation: 98 %
PCO2 VEN: 39 mmHg — AB (ref 44.0–60.0)
PH VEN: 7.378 (ref 7.250–7.430)
PO2 VEN: 113 mmHg — AB (ref 32.0–45.0)
Patient temperature: 98.2
TCO2: 24 mmol/L (ref 0–100)

## 2015-10-30 LAB — POCT I-STAT 3, ART BLOOD GAS (G3+)
ACID-BASE DEFICIT: 4 mmol/L — AB (ref 0.0–2.0)
BICARBONATE: 21.2 mmol/L (ref 20.0–28.0)
O2 Saturation: 96 %
PCO2 ART: 38.8 mmHg (ref 32.0–48.0)
PH ART: 7.343 — AB (ref 7.350–7.450)
PO2 ART: 85 mmHg (ref 83.0–108.0)
Patient temperature: 97.7
TCO2: 22 mmol/L (ref 0–100)

## 2015-10-30 LAB — BASIC METABOLIC PANEL
Anion gap: 5 (ref 5–15)
BUN: 6 mg/dL (ref 6–20)
CO2: 25 mmol/L (ref 22–32)
Calcium: 7.6 mg/dL — ABNORMAL LOW (ref 8.9–10.3)
Chloride: 109 mmol/L (ref 101–111)
Creatinine, Ser: 0.69 mg/dL (ref 0.61–1.24)
GFR calc Af Amer: >60 mL/min (ref >60)
GLUCOSE: 98 mg/dL (ref 65–99)
POTASSIUM: 3.9 mmol/L (ref 3.5–5.1)
Sodium: 139 mmol/L (ref 135–145)

## 2015-10-30 LAB — POCT I-STAT, CHEM 8
BUN: 12 mg/dL (ref 6–20)
CALCIUM ION: 1.07 mmol/L — AB (ref 1.15–1.40)
CREATININE: 0.9 mg/dL (ref 0.61–1.24)
Chloride: 103 mmol/L (ref 101–111)
GLUCOSE: 166 mg/dL — AB (ref 65–99)
HEMATOCRIT: 27 % — AB (ref 39.0–52.0)
HEMOGLOBIN: 9.2 g/dL — AB (ref 13.0–17.0)
Potassium: 4.9 mmol/L (ref 3.5–5.1)
SODIUM: 137 mmol/L (ref 135–145)
TCO2: 23 mmol/L (ref 0–100)

## 2015-10-30 LAB — MAGNESIUM
MAGNESIUM: 2.3 mg/dL (ref 1.7–2.4)
Magnesium: 2.4 mg/dL (ref 1.7–2.4)

## 2015-10-30 LAB — CREATININE, SERUM: Creatinine, Ser: 1.04 mg/dL (ref 0.61–1.24)

## 2015-10-30 LAB — HEMOGLOBIN A1C
Hgb A1c MFr Bld: 11.2 % — ABNORMAL HIGH (ref 4.8–5.6)
Mean Plasma Glucose: 275 mg/dL

## 2015-10-30 MED ORDER — LEVALBUTEROL HCL 0.63 MG/3ML IN NEBU
0.6300 mg | INHALATION_SOLUTION | Freq: Two times a day (BID) | RESPIRATORY_TRACT | Status: DC
  Administered 2015-10-30 – 2015-10-31 (×3): 0.63 mg via RESPIRATORY_TRACT
  Filled 2015-10-30 (×4): qty 3

## 2015-10-30 MED ORDER — INSULIN DETEMIR 100 UNIT/ML ~~LOC~~ SOLN
8.0000 [IU] | Freq: Two times a day (BID) | SUBCUTANEOUS | Status: DC
  Administered 2015-10-30 – 2015-11-08 (×19): 8 [IU] via SUBCUTANEOUS
  Filled 2015-10-30 (×20): qty 0.08

## 2015-10-30 MED ORDER — LEVALBUTEROL HCL 0.63 MG/3ML IN NEBU
0.6300 mg | INHALATION_SOLUTION | Freq: Three times a day (TID) | RESPIRATORY_TRACT | Status: DC
  Administered 2015-10-30: 0.63 mg via RESPIRATORY_TRACT
  Filled 2015-10-30: qty 3

## 2015-10-30 MED ORDER — GUAIFENESIN ER 600 MG PO TB12
600.0000 mg | ORAL_TABLET | Freq: Two times a day (BID) | ORAL | Status: DC
  Administered 2015-10-30 – 2015-11-08 (×16): 600 mg via ORAL
  Filled 2015-10-30 (×17): qty 1

## 2015-10-30 MED ORDER — FUROSEMIDE 10 MG/ML IJ SOLN
20.0000 mg | Freq: Two times a day (BID) | INTRAMUSCULAR | Status: DC
  Administered 2015-10-30 – 2015-10-31 (×4): 20 mg via INTRAVENOUS
  Filled 2015-10-30 (×4): qty 2

## 2015-10-30 MED ORDER — INSULIN ASPART 100 UNIT/ML ~~LOC~~ SOLN
0.0000 [IU] | SUBCUTANEOUS | Status: DC
Start: 2015-10-30 — End: 2015-11-01
  Administered 2015-10-30: 2 [IU] via SUBCUTANEOUS
  Administered 2015-10-30: 4 [IU] via SUBCUTANEOUS
  Administered 2015-10-30: 2 [IU] via SUBCUTANEOUS
  Administered 2015-10-31 (×4): 4 [IU] via SUBCUTANEOUS
  Administered 2015-10-31: 8 [IU] via SUBCUTANEOUS
  Administered 2015-10-31 – 2015-11-01 (×3): 2 [IU] via SUBCUTANEOUS
  Administered 2015-11-01: 4 [IU] via SUBCUTANEOUS
  Administered 2015-11-01: 2 [IU] via SUBCUTANEOUS

## 2015-10-30 NOTE — Procedures (Signed)
Extubation Procedure Note  Patient Details:   Name: [name] DOB: Jun 13, 1945 MRN: [id number]   Airway Documentation:     Evaluation  O2 sats: stable throughout Complications: No apparent complications Patient did tolerate procedure well. Bilateral Breath Sounds: Clear, Diminished   Yes  Jori Moll 10/30/2015, 1:01 AM   Patient was extubated to 4L following positive cuff leak. Patient pulled FVC of .9L and NIF of -20. Patient demonstrated ability to speak his name and location. RT will continue to monitor.

## 2015-10-30 NOTE — Progress Notes (Signed)
CT surgery p.m. Rounds  Stable day out of bed to chair Too weak to walk in hallway Physical therapy ordered P.m. labs are satisfactory Blood pressure 95 on low-dose dopamine, sinus rhythm

## 2015-10-30 NOTE — Progress Notes (Signed)
Rapid Wean Protocol attempt #2 begun. RT will continue to monitor throughout.

## 2015-10-30 NOTE — Progress Notes (Signed)
1 Day Post-Op Procedure(s) (LRB): CORONARY ARTERY BYPASS GRAFTING (CABG) x Four UTILIZING THE LEFT INTERNAL MAMMARY ARTERY AND ENDOSCOPICALLY HARVESTED RIGHT SAPEHENEOUS VEINS. (N/A) AORTIC VALVE REPLACEMENT (AVR) WITH 23MM MAGNA EASE TISSUE VALVE. (N/A) REPLACEMENT OF ASCENDING AORTA USING 34MM X 30CM WOVEN DOUBLE VELOUR VASCULAR GRAFT. (N/A) TRANSESOPHAGEAL ECHOCARDIOGRAM (TEE) (N/A) Subjective: Progressing well after a biologic-Bentall with CABG Hemodynamic stable Neuro intact  Objective: Vital signs in last 24 hours: Temp:  [96.6 F (35.9 C)-98.6 F (37 C)] 98.6 F (37 C) (09/16 1015) Pulse Rate:  [75-90] 77 (09/16 1015) Cardiac Rhythm: Normal sinus rhythm (09/16 0829) Resp:  [12-35] 23 (09/16 1015) BP: (90-130)/(49-76) 102/49 (09/16 1000) SpO2:  [95 %-100 %] 99 % (09/16 1015) Arterial Line BP: (86-131)/(45-66) 105/49 (09/16 1015) FiO2 (%):  [40 %-50 %] 40 % (09/16 0031) Weight:  [212 lb 8.4 oz (96.4 kg)] 212 lb 8.4 oz (96.4 kg) (09/16 0500)  Hemodynamic parameters for last 24 hours: PAP: (15-31)/(0-18) 27/15 CO:  [2.8 L/min-5.2 L/min] 5.2 L/min CI:  [1.3 L/min/m2-2.4 L/min/m2] 2.4 L/min/m2  Intake/Output from previous day: 09/15 0701 - 09/16 0700 In: 6755.4 [P.O.:150; I.V.:3730.4; Blood:1040; NG/GT:60; IV Piggyback:1775] Out: [id number] Storm.Jain; Emesis/NG output:50; Blood:1740; Chest Tube:710] Intake/Output this shift: Total I/O In: 150 [P.O.:60; I.V.:90] Out: 235 [Urine:115; Chest Tube:120]       Exam    General- alert and comfortable   Lungs- clear without rales, wheezes   Cor- regular rate and rhythm, no murmur , gallop   Abdomen- soft, non-tender   Extremities - warm, non-tender, minimal edema   Neuro- oriented, appropriate, no focal weakness   Lab Results:  Recent Labs  10/29/15 2325 10/30/15 0410  WBC 11.4* 10.8*  HGB 10.2* 9.9*  HCT 30.3* 29.6*  PLT 100* 105*   BMET:  Recent Labs  10/29/15 0520  10/29/15 2304 10/29/15 2325 10/30/15 0410   NA 137  < > 140  --  139  K 4.2  < > 4.1  --  3.9  CL 100*  < > 105  --  109  CO2 29  --   --   --  25  GLUCOSE 217*  < > 166*  --  98  BUN 10  < > 5*  --  6  CREATININE 0.90  < > 0.70 0.74 0.69  CALCIUM 9.4  --   --   --  7.6*  < > = values in this interval not displayed.  PT/INR:  Recent Labs  10/29/15 1715  LABPROT 22.4*  INR 1.94   ABG    Component Value Date/Time   PHART 7.343 (L) 10/30/2015 0046   HCO3 23.0 10/30/2015 0224   TCO2 24 10/30/2015 0224   ACIDBASEDEF 2.0 10/30/2015 0224   O2SAT 98.0 10/30/2015 0224   CBG (last 3)   Recent Labs  10/30/15 0840 10/30/15 0939 10/30/15 1051  GLUCAP 103* 114* 116*    Assessment/Plan: S/P Procedure(s) (LRB): CORONARY ARTERY BYPASS GRAFTING (CABG) x Four UTILIZING THE LEFT INTERNAL MAMMARY ARTERY AND ENDOSCOPICALLY HARVESTED RIGHT SAPEHENEOUS VEINS. (N/A) AORTIC VALVE REPLACEMENT (AVR) WITH 23MM MAGNA EASE TISSUE VALVE. (N/A) REPLACEMENT OF ASCENDING AORTA USING 34MM X 30CM WOVEN DOUBLE VELOUR VASCULAR GRAFT. (N/A) TRANSESOPHAGEAL ECHOCARDIOGRAM (TEE) (N/A) Mobilize Diuresis Diabetes control d/c tubes/lines See progression orders Continue low-dose dopamine   LOS: 3 days    [name] 10/30/2015

## 2015-10-31 ENCOUNTER — Inpatient Hospital Stay (HOSPITAL_COMMUNITY): Payer: Medicare Other

## 2015-10-31 LAB — POCT I-STAT, CHEM 8
BUN: 23 mg/dL — ABNORMAL HIGH (ref 6–20)
CALCIUM ION: 1.11 mmol/L — AB (ref 1.15–1.40)
Chloride: 99 mmol/L — ABNORMAL LOW (ref 101–111)
Creatinine, Ser: 1.2 mg/dL (ref 0.61–1.24)
Glucose, Bld: 173 mg/dL — ABNORMAL HIGH (ref 65–99)
HCT: 26 % — ABNORMAL LOW (ref 39.0–52.0)
Hemoglobin: 8.8 g/dL — ABNORMAL LOW (ref 13.0–17.0)
Potassium: 4.3 mmol/L (ref 3.5–5.1)
SODIUM: 136 mmol/L (ref 135–145)
TCO2: 25 mmol/L (ref 0–100)

## 2015-10-31 LAB — CBC
HCT: 24.8 % — ABNORMAL LOW (ref 39.0–52.0)
Hemoglobin: 8.4 g/dL — ABNORMAL LOW (ref 13.0–17.0)
MCH: 30.4 pg (ref 26.0–34.0)
MCHC: 33.9 g/dL (ref 30.0–36.0)
MCV: 89.9 fL (ref 78.0–100.0)
Platelets: 101 10*3/uL — ABNORMAL LOW (ref 150–400)
RBC: 2.76 MIL/uL — ABNORMAL LOW (ref 4.22–5.81)
RDW: 13.8 % (ref 11.5–15.5)
WBC: 13.2 10*3/uL — ABNORMAL HIGH (ref 4.0–10.5)

## 2015-10-31 LAB — BASIC METABOLIC PANEL
Anion gap: 7 (ref 5–15)
BUN: 17 mg/dL (ref 6–20)
CO2: 25 mmol/L (ref 22–32)
Calcium: 7.5 mg/dL — ABNORMAL LOW (ref 8.9–10.3)
Chloride: 104 mmol/L (ref 101–111)
Creatinine, Ser: 1.07 mg/dL (ref 0.61–1.24)
GFR calc Af Amer: >60 mL/min (ref >60)
GFR calc non Af Amer: >60 mL/min (ref >60)
Glucose, Bld: 172 mg/dL — ABNORMAL HIGH (ref 65–99)
Potassium: 4.2 mmol/L (ref 3.5–5.1)
Sodium: 136 mmol/L (ref 135–145)

## 2015-10-31 LAB — GLUCOSE, CAPILLARY
GLUCOSE-CAPILLARY: 137 mg/dL — AB (ref 65–99)
GLUCOSE-CAPILLARY: 190 mg/dL — AB (ref 65–99)
GLUCOSE-CAPILLARY: 165 mg/dL — AB (ref 65–99)
GLUCOSE-CAPILLARY: 234 mg/dL — AB (ref 65–99)
Glucose-Capillary: 168 mg/dL — ABNORMAL HIGH (ref 65–99)

## 2015-10-31 NOTE — Evaluation (Signed)
Physical Therapy Evaluation Patient Details Name: [name] MRN: [id number] DOB: 1945-06-09 Today's Date: 10/31/2015   History of Present Illness  [name] is a 70 yo African American Male who has been experiencing chest pain more frequently; now post cardiac Cath, the CABG x 4, Aortic valve replacement, and replacement of ascending Aorta;  has a past medical history of Aortic stenosis; Cancer ([place]); Carpal tunnel syndrome (09/24/2014); Coronary artery disease; Diabetes mellitus, type 2 ([place]); Erectile dysfunction; Heart murmur; Hyperlipidemia; and Hypertension.  Clinical Impression   Patient is s/p above surgery resulting in functional limitations due to the deficits listed below (see PT Problem List). Presents with weakness and decr endurance; very conscientious of sternal precautions and approaches mobilizing very carefully;  Patient will benefit from skilled PT to increase their independence and safety with mobility to allow discharge to the venue listed below.       Follow Up Recommendations Home health PT;Supervision/Assistance - 24 hour    Equipment Recommendations  Rolling walker with 5" wheels;3in1 (PT) (may progress to not needing RW)    Recommendations for Other Services Other (comment) (Cardiac Rehab phase 1)     Precautions / Restrictions Precautions Precautions: Sternal      Mobility  Bed Mobility Overal bed mobility: Needs Assistance Bed Mobility: Sit to Supine       Sit to supine: Min assist   General bed mobility comments: Cues for technique; min assist to help LEs into bed  Transfers Overall transfer level: Needs assistance Equipment used: Rolling walker (2 wheeled) Transfers: Sit to/from Stand Sit to Stand: Mod assist         General transfer comment: Light mod assist with use of bed pad to help power up; cues for sternal precautions  Ambulation/Gait Ambulation/Gait assistance: Min assist;Min guard;+2 safety/equipment Ambulation Distance  (Feet): 85 Feet Assistive device: Rolling walker (2 wheeled) (at times, not using RW) Gait Pattern/deviations: Step-through pattern;Decreased step length - right;Decreased step length - left;Decreased stride length   Gait velocity interpretation: Below normal speed for age/gender General Gait Details: Pt very much challenges himself to not use UEs for support with amb; cues to self-monitor for activity tolerance  Stairs            Wheelchair Mobility    Modified Rankin (Stroke Patients Only)       Balance                                             Pertinent Vitals/Pain Pain Assessment: 0-10 Pain Score: 5  Pain Location: postop sternal pain Pain Descriptors / Indicators: Aching;Operative site guarding Pain Intervention(s): Limited activity within patient's tolerance;Monitored during session;Repositioned    Home Living Family/patient expects to be discharged to:: Private residence Living Arrangements: Spouse/significant other;Children Available Help at Discharge: Family;Available 24 hours/day Type of Home: House Home Access: Level entry     Home Layout: Two level;Bed/bath upstairs;1/2 bath on main level (States he can sponge bathe if needed)        Prior Function Level of Independence: Independent               Hand Dominance        Extremity/Trunk Assessment   Upper Extremity Assessment: Generalized weakness (Minimized UE use due to sternal precautions)           Lower Extremity Assessment: Generalized weakness  Communication   Communication: No difficulties  Cognition Arousal/Alertness: Awake/alert Behavior During Therapy: WFL for tasks assessed/performed Overall Cognitive Status: Within Functional Limits for tasks assessed                      General Comments General comments (skin integrity, edema, etc.): VSS during walk; session conducted on Room Air and O2 sats remained at acceptable levels     Exercises     Assessment/Plan    PT Assessment Patient needs continued PT services  PT Problem List Decreased strength;Decreased activity tolerance;Decreased range of motion;Decreased balance;Decreased mobility;Decreased knowledge of use of DME;Decreased knowledge of precautions;Pain;Cardiopulmonary status limiting activity          PT Treatment Interventions DME instruction;Gait training;Stair training;Functional mobility training;Therapeutic activities;Therapeutic exercise;Patient/family education    PT Goals (Current goals can be found in the Care Plan section)  Acute Rehab PT Goals Patient Stated Goal: get home PT Goal Formulation: With patient Time For Goal Achievement: 11/14/15 Potential to Achieve Goals: Good    Frequency Min 3X/week   Barriers to discharge   flight of steps to reach full bath    Co-evaluation               End of Session   Activity Tolerance: Patient tolerated treatment well Patient left: in bed;with call bell/phone within reach Nurse Communication: Mobility status;Other (comment) (Pt requesting to speak wiht her)         Time: 0931-1006 PT Time Calculation (min) (ACUTE ONLY): 35 min   Charges:   PT Evaluation $PT Eval Moderate Complexity: 1 Procedure PT Treatments $Gait Training: 8-22 mins   PT G Codes:        Roney Marion Hamff 10/31/2015, 10:28 AM  Roney Marion, [place] Pager [phone] Office [phone]

## 2015-10-31 NOTE — Progress Notes (Signed)
2 Days Post-Op Procedure(s) (LRB): CORONARY ARTERY BYPASS GRAFTING (CABG) x Four UTILIZING THE LEFT INTERNAL MAMMARY ARTERY AND ENDOSCOPICALLY HARVESTED RIGHT SAPEHENEOUS VEINS. (N/A) AORTIC VALVE REPLACEMENT (AVR) WITH 23MM MAGNA EASE TISSUE VALVE. (N/A) REPLACEMENT OF ASCENDING AORTA USING 34MM X 30CM WOVEN DOUBLE VELOUR VASCULAR GRAFT. (N/A) TRANSESOPHAGEAL ECHOCARDIOGRAM (TEE) (N/A) Subjective: Feels better today First ambulation in the hallway today, getting stronger Low-dose dopamine being weaned off, normal preop EF Continue with diuresis and pulmonary toilet Chest tubes still draining serosanguineous fluid-leave MT tubes  Objective: Vital signs in last 24 hours: Temp:  [97.8 F (36.6 C)-98.6 F (37 C)] 98.3 F (36.8 C) (09/17 0830) Pulse Rate:  [79-85] 80 (09/17 0700) Cardiac Rhythm: Normal sinus rhythm (09/17 0741) Resp:  [15-37] 19 (09/17 0700) BP: (100-123)/(51-60) 107/56 (09/17 0700) SpO2:  [92 %-99 %] 97 % (09/17 0854) Arterial Line BP: (97-134)/(46-57) 116/48 (09/17 0700) Weight:  [212 lb 4.9 oz (96.3 kg)] 212 lb 4.9 oz (96.3 kg) (09/17 0500)  Hemodynamic parameters for last 24 hours:  sinus rhythm  Intake/Output from previous day: 09/16 0701 - 09/17 0700 In: 1485.1 [P.O.:600; I.V.:835.1; IV Piggyback:50] Out: [id number] [Urine:1130; Chest Tube:490] Intake/Output this shift: Total I/O In: 5.2 [I.V.:5.2] Out: 25 [Urine:25]       Exam    General- alert and comfortable   Lungs- clear without rales, wheezes   Cor- regular rate and rhythm, no murmur , gallop   Abdomen- soft, non-tender   Extremities - warm, non-tender, minimal edema   Neuro- oriented, appropriate, no focal weakness   Lab Results:  Recent Labs  10/30/15 1700 10/30/15 1727 10/31/15 0439  WBC 15.0*  --  13.2*  HGB 9.0* 9.2* 8.4*  HCT 27.0* 27.0* 24.8*  PLT 100*  --  101*   BMET:  Recent Labs  10/30/15 0410  10/30/15 1727 10/31/15 0439  NA 139  --  137 136  K 3.9  --  4.9 4.2  CL  109  --  103 104  CO2 25  --   --  25  GLUCOSE 98  --  166* 172*  BUN 6  --  12 17  CREATININE 0.69  < > 0.90 1.07  CALCIUM 7.6*  --   --  7.5*  < > = values in this interval not displayed.  PT/INR:  Recent Labs  10/29/15 1715  LABPROT 22.4*  INR 1.94   ABG    Component Value Date/Time   PHART 7.343 (L) 10/30/2015 0046   HCO3 23.0 10/30/2015 0224   TCO2 23 10/30/2015 1727   ACIDBASEDEF 2.0 10/30/2015 0224   O2SAT 98.0 10/30/2015 0224   CBG (last 3)   Recent Labs  10/30/15 2327 10/31/15 0324 10/31/15 0825  GLUCAP 191* 168* 137*    Assessment/Plan: S/P Procedure(s) (LRB): CORONARY ARTERY BYPASS GRAFTING (CABG) x Four UTILIZING THE LEFT INTERNAL MAMMARY ARTERY AND ENDOSCOPICALLY HARVESTED RIGHT SAPEHENEOUS VEINS. (N/A) AORTIC VALVE REPLACEMENT (AVR) WITH 23MM MAGNA EASE TISSUE VALVE. (N/A) REPLACEMENT OF ASCENDING AORTA USING 34MM X 30CM WOVEN DOUBLE VELOUR VASCULAR GRAFT. (N/A) TRANSESOPHAGEAL ECHOCARDIOGRAM (TEE) (N/A) Continue diuresis-diabetic control- and mobilization Wean off dopamine   LOS: 4 days    Tharon Aquas Trigt III 10/31/2015

## 2015-10-31 NOTE — Progress Notes (Signed)
CT surgery p.m. Rounds  Patient examined and record reviewed.Hemodynamics stable,labs satisfactory.Patient had stable day.Continue current care. Tharon Aquas Trigt III 10/31/2015

## 2015-11-01 ENCOUNTER — Encounter (HOSPITAL_COMMUNITY): Payer: Self-pay | Admitting: Cardiothoracic Surgery

## 2015-11-01 ENCOUNTER — Telehealth: Payer: Self-pay | Admitting: Cardiovascular Disease

## 2015-11-01 ENCOUNTER — Inpatient Hospital Stay (HOSPITAL_COMMUNITY): Payer: Medicare Other

## 2015-11-01 ENCOUNTER — Encounter: Payer: Self-pay | Admitting: Nurse Practitioner

## 2015-11-01 LAB — GLUCOSE, CAPILLARY
GLUCOSE-CAPILLARY: 184 mg/dL — AB (ref 65–99)
Glucose-Capillary: 122 mg/dL — ABNORMAL HIGH (ref 65–99)
Glucose-Capillary: 137 mg/dL — ABNORMAL HIGH (ref 65–99)
Glucose-Capillary: 155 mg/dL — ABNORMAL HIGH (ref 65–99)
Glucose-Capillary: 158 mg/dL — ABNORMAL HIGH (ref 65–99)
Glucose-Capillary: 168 mg/dL — ABNORMAL HIGH (ref 65–99)

## 2015-11-01 LAB — BASIC METABOLIC PANEL
Anion gap: 5 (ref 5–15)
BUN: 20 mg/dL (ref 6–20)
CO2: 27 mmol/L (ref 22–32)
Calcium: 7.8 mg/dL — ABNORMAL LOW (ref 8.9–10.3)
Chloride: 103 mmol/L (ref 101–111)
Creatinine, Ser: 1.02 mg/dL (ref 0.61–1.24)
GFR calc Af Amer: >60 mL/min (ref >60)
GFR calc non Af Amer: >60 mL/min (ref >60)
Glucose, Bld: 146 mg/dL — ABNORMAL HIGH (ref 65–99)
Potassium: 3.9 mmol/L (ref 3.5–5.1)
Sodium: 135 mmol/L (ref 135–145)

## 2015-11-01 LAB — CBC
HCT: 22.2 % — ABNORMAL LOW (ref 39.0–52.0)
Hemoglobin: 7.4 g/dL — ABNORMAL LOW (ref 13.0–17.0)
MCH: 30.1 pg (ref 26.0–34.0)
MCHC: 33.3 g/dL (ref 30.0–36.0)
MCV: 90.2 fL (ref 78.0–100.0)
Platelets: 107 10*3/uL — ABNORMAL LOW (ref 150–400)
RBC: 2.46 MIL/uL — ABNORMAL LOW (ref 4.22–5.81)
RDW: 14.1 % (ref 11.5–15.5)
WBC: 11.5 10*3/uL — ABNORMAL HIGH (ref 4.0–10.5)

## 2015-11-01 MED ORDER — FOLIC ACID 1 MG PO TABS
1.0000 mg | ORAL_TABLET | Freq: Every day | ORAL | Status: DC
  Administered 2015-11-01 – 2015-11-08 (×8): 1 mg via ORAL
  Filled 2015-11-01 (×8): qty 1

## 2015-11-01 MED ORDER — BISACODYL 5 MG PO TBEC: 10.0000 mg | DELAYED_RELEASE_TABLET | Freq: Every day | ORAL | Status: DC | PRN

## 2015-11-01 MED ORDER — OXYCODONE HCL 5 MG PO TABS: 5.0000 mg | ORAL_TABLET | ORAL | Status: DC | PRN

## 2015-11-01 MED ORDER — TRAMADOL HCL 50 MG PO TABS
50.0000 mg | ORAL_TABLET | ORAL | Status: DC | PRN
  Administered 2015-11-02 – 2015-11-05 (×3): 100 mg via ORAL
  Filled 2015-11-01 (×3): qty 2

## 2015-11-01 MED ORDER — PANTOPRAZOLE SODIUM 40 MG PO TBEC
40.0000 mg | DELAYED_RELEASE_TABLET | Freq: Every day | ORAL | Status: DC
  Administered 2015-11-02 – 2015-11-08 (×7): 40 mg via ORAL
  Filled 2015-11-01 (×7): qty 1

## 2015-11-01 MED ORDER — FUROSEMIDE 10 MG/ML IJ SOLN
20.0000 mg | Freq: Two times a day (BID) | INTRAMUSCULAR | Status: AC
  Administered 2015-11-01 (×2): 20 mg via INTRAVENOUS
  Filled 2015-11-01 (×2): qty 2

## 2015-11-01 MED ORDER — BISACODYL 10 MG RE SUPP: 10.0000 mg | Freq: Every day | RECTAL | Status: DC | PRN

## 2015-11-01 MED ORDER — METOPROLOL TARTRATE 12.5 MG HALF TABLET: 12.5000 mg | ORAL_TABLET | Freq: Two times a day (BID) | ORAL | Status: DC

## 2015-11-01 MED ORDER — ONDANSETRON HCL 4 MG PO TABS: 4.0000 mg | ORAL_TABLET | Freq: Four times a day (QID) | ORAL | Status: DC | PRN

## 2015-11-01 MED ORDER — METOPROLOL TARTRATE 12.5 MG HALF TABLET
12.5000 mg | ORAL_TABLET | Freq: Two times a day (BID) | ORAL | Status: DC
  Administered 2015-11-01 – 2015-11-03 (×4): 12.5 mg via ORAL
  Filled 2015-11-01 (×4): qty 1

## 2015-11-01 MED ORDER — DOCUSATE SODIUM 100 MG PO CAPS
200.0000 mg | ORAL_CAPSULE | Freq: Every day | ORAL | Status: DC
  Administered 2015-11-01 – 2015-11-08 (×8): 200 mg via ORAL
  Filled 2015-11-01 (×8): qty 2

## 2015-11-01 MED ORDER — SODIUM CHLORIDE 0.9% FLUSH
3.0000 mL | Freq: Two times a day (BID) | INTRAVENOUS | Status: DC
  Administered 2015-11-02 – 2015-11-07 (×10): 3 mL via INTRAVENOUS

## 2015-11-01 MED ORDER — SODIUM CHLORIDE 0.9% FLUSH: 3.0000 mL | INTRAVENOUS | Status: DC | PRN

## 2015-11-01 MED ORDER — ONDANSETRON HCL 4 MG/2ML IJ SOLN: 4.0000 mg | Freq: Four times a day (QID) | INTRAMUSCULAR | Status: DC | PRN

## 2015-11-01 MED ORDER — SODIUM CHLORIDE 0.9 % IV SOLN: 250.0000 mL | INTRAVENOUS | Status: DC | PRN

## 2015-11-01 MED ORDER — POTASSIUM CHLORIDE CRYS ER 20 MEQ PO TBCR
20.0000 meq | EXTENDED_RELEASE_TABLET | Freq: Every day | ORAL | Status: AC
  Administered 2015-11-01: 20 meq via ORAL
  Filled 2015-11-01: qty 1

## 2015-11-01 MED ORDER — FERROUS GLUCONATE 324 (38 FE) MG PO TABS
324.0000 mg | ORAL_TABLET | Freq: Two times a day (BID) | ORAL | Status: DC
  Administered 2015-11-01 – 2015-11-08 (×15): 324 mg via ORAL
  Filled 2015-11-01 (×16): qty 1

## 2015-11-01 MED ORDER — INSULIN ASPART 100 UNIT/ML ~~LOC~~ SOLN
0.0000 [IU] | Freq: Three times a day (TID) | SUBCUTANEOUS | Status: DC
  Administered 2015-11-01: 2 [IU] via SUBCUTANEOUS
  Administered 2015-11-01: 4 [IU] via SUBCUTANEOUS
  Administered 2015-11-02: 2 [IU] via SUBCUTANEOUS
  Administered 2015-11-02 (×2): 4 [IU] via SUBCUTANEOUS
  Administered 2015-11-03 (×3): 2 [IU] via SUBCUTANEOUS
  Administered 2015-11-03: 4 [IU] via SUBCUTANEOUS
  Administered 2015-11-04 (×3): 2 [IU] via SUBCUTANEOUS
  Administered 2015-11-04: 4 [IU] via SUBCUTANEOUS
  Administered 2015-11-05: 2 [IU] via SUBCUTANEOUS
  Administered 2015-11-05: 8 [IU] via SUBCUTANEOUS
  Administered 2015-11-06: 2 [IU] via SUBCUTANEOUS
  Administered 2015-11-06: 4 [IU] via SUBCUTANEOUS
  Administered 2015-11-06: 2 [IU] via SUBCUTANEOUS
  Administered 2015-11-07 (×2): 4 [IU] via SUBCUTANEOUS
  Administered 2015-11-07 – 2015-11-08 (×2): 2 [IU] via SUBCUTANEOUS

## 2015-11-01 MED ORDER — ASPIRIN EC 81 MG PO TBEC
81.0000 mg | DELAYED_RELEASE_TABLET | Freq: Every day | ORAL | Status: DC
  Administered 2015-11-02 – 2015-11-05 (×4): 81 mg via ORAL
  Filled 2015-11-01 (×4): qty 1

## 2015-11-01 MED ORDER — MOVING RIGHT ALONG BOOK
Freq: Once | Status: AC
  Administered 2015-11-01: 15:00:00
  Filled 2015-11-01: qty 1

## 2015-11-01 MED FILL — Heparin Sodium (Porcine) Inj 1000 Unit/ML: INTRAMUSCULAR | Qty: 10 | Status: AC

## 2015-11-01 MED FILL — Mannitol IV Soln 20%: INTRAVENOUS | Qty: 500 | Status: AC

## 2015-11-01 MED FILL — Thrombin For Soln 5000 Unit: CUTANEOUS | Qty: 5000 | Status: AC

## 2015-11-01 MED FILL — Electrolyte-R (PH 7.4) Solution: INTRAVENOUS | Qty: 4000 | Status: AC

## 2015-11-01 MED FILL — Sodium Chloride IV Soln 0.9%: INTRAVENOUS | Qty: 3000 | Status: AC

## 2015-11-01 MED FILL — Lidocaine HCl IV Inj 20 MG/ML: INTRAVENOUS | Qty: 5 | Status: AC

## 2015-11-01 MED FILL — Albumin, Human Inj 5%: INTRAVENOUS | Qty: 250 | Status: AC

## 2015-11-01 MED FILL — Sodium Bicarbonate IV Soln 8.4%: INTRAVENOUS | Qty: 50 | Status: AC

## 2015-11-01 NOTE — Progress Notes (Signed)
Left message for patient's daughter to pick up letter from front desk

## 2015-11-01 NOTE — Care Management Note (Signed)
Case Management Note  Patient Details  Name: [name] MRN: [id number] Date of Birth: 1946-01-19  Subjective/Objective:    3 days s/p CABG and AVR               Action/Plan:  PTA from home with wife.  CM will continue to follow for discharge needs   Expected Discharge Date:                  Expected Discharge Plan:  [place]  In-House Referral:     Discharge planning Services  CM Consult  Post Acute Care Choice:    Choice offered to:     DME Arranged:    DME Agency:     HH Arranged:    HH Agency:     Status of Service:  In process, will continue to follow  If discussed at Long Length of Stay Meetings, dates discussed:    Additional Comments:  Maryclare Labrador, RN 11/01/2015, 3:44 PM

## 2015-11-01 NOTE — Care Management Important Message (Signed)
Important Message  Patient Details  Name: [name] MRN: [id number] Date of Birth: October 18, 1945   Medicare Important Message Given:  Yes    Nathen May 11/01/2015, 9:44 AM

## 2015-11-01 NOTE — Telephone Encounter (Signed)
[name] is calling because she is needing a return to school note . Her father had to to have open heart surgery and is still I the hospital . Please call

## 2015-11-01 NOTE — Telephone Encounter (Signed)
Left message for patient's daughter that note has been signed and can be picked up at front desk between 8 am and 5 pm

## 2015-11-01 NOTE — Progress Notes (Signed)
Patient ID: [name], male   DOB: [birth year], 70 y.o.   MRN: [id number] TCTS DAILY ICU PROGRESS NOTE                   [address]            [place] [address]          [phone]   3 Days Post-Op Procedure(s) (LRB): CORONARY ARTERY BYPASS GRAFTING (CABG) x Four UTILIZING THE LEFT INTERNAL MAMMARY ARTERY AND ENDOSCOPICALLY HARVESTED RIGHT SAPEHENEOUS VEINS. (N/A) AORTIC VALVE REPLACEMENT (AVR) WITH 23MM MAGNA EASE TISSUE VALVE. (N/A) REPLACEMENT OF ASCENDING AORTA USING 34MM X 30CM WOVEN DOUBLE VELOUR VASCULAR GRAFT. (N/A) TRANSESOPHAGEAL ECHOCARDIOGRAM (TEE) (N/A)  Total Length of Stay:  LOS: 5 days   Subjective: Awake and alert, neuro intact  Objective: Vital signs in last 24 hours: Temp:  [97.9 F (36.6 C)-98.9 F (37.2 C)] 98.5 F (36.9 C) (09/18 0003) Pulse Rate:  [76-83] 79 (09/18 0700) Cardiac Rhythm: Normal sinus rhythm (09/17 2000) Resp:  [15-26] 26 (09/18 0700) BP: (93-122)/(54-66) 111/63 (09/18 0700) SpO2:  [91 %-97 %] 92 % (09/18 0700) Weight:  [211 lb 3.2 oz (95.8 kg)] 211 lb 3.2 oz (95.8 kg) (09/18 0600)  Filed Weights   10/30/15 0500 10/31/15 0500 11/01/15 0600  Weight: 212 lb 8.4 oz (96.4 kg) 212 lb 4.9 oz (96.3 kg) 211 lb 3.2 oz (95.8 kg)    Weight change: -1 lb 1.6 oz (-0.5 kg)   Hemodynamic parameters for last 24 hours:    Intake/Output from previous day: 09/17 0701 - 09/18 0700 In: 1000.2 [P.O.:600; I.V.:400.2] Out: 1220 [Urine:1020; Chest Tube:200]  Intake/Output this shift: No intake/output data recorded.  Current Meds: Scheduled Meds: . acetaminophen  1,000 mg Oral Q6H   Or  . acetaminophen (TYLENOL) oral liquid 160 mg/5 mL  1,000 mg Per Tube Q6H  . aspirin EC  81 mg Oral Daily  . atorvastatin  40 mg Oral Daily  . bisacodyl  10 mg Oral Daily   Or  . bisacodyl  10 mg Rectal Daily  . chlorhexidine gluconate (MEDLINE KIT)  15 mL Mouth Rinse BID  . docusate sodium  200 mg Oral Daily  . furosemide  20 mg  Intravenous BID  . guaiFENesin  600 mg Oral BID  . insulin aspart  0-24 Units Subcutaneous Q4H  . insulin detemir  8 Units Subcutaneous BID  . levalbuterol  0.63 mg Nebulization BID  . metoCLOPramide (REGLAN) injection  10 mg Intravenous Q6H  . metoprolol tartrate  12.5 mg Oral BID   Or  . metoprolol tartrate  12.5 mg Per Tube BID  . pantoprazole  40 mg Oral Daily  . sodium chloride flush  3 mL Intravenous Q12H   Continuous Infusions: . sodium chloride Stopped (10/30/15 1100)  . sodium chloride Stopped (10/30/15 1000)  . sodium chloride 20 mL/hr at 10/29/15 1916  . lactated ringers 10 mL/hr at 11/01/15 0700  . lactated ringers 10 mL/hr at 11/01/15 0700  . phenylephrine (NEO-SYNEPHRINE) Adult infusion Stopped (10/30/15 1030)   PRN Meds:.sodium chloride, metoprolol, morphine injection, ondansetron (ZOFRAN) IV, oxyCODONE, sodium chloride flush, traMADol  General appearance: alert and cooperative Neurologic: intact Heart: regular rate and rhythm, S1, S2 normal, no murmur, click, rub or gallop Lungs: diminished breath sounds bibasilar Abdomen: soft, non-tender; bowel sounds normal; no masses,  no organomegaly Extremities: extremities normal, atraumatic, no cyanosis or edema and Homans sign is negative, no sign of DVT Wound: sternum stable  Lab Results: CBC: Recent Labs  10/31/15 0439 10/31/15 1622 11/01/15 0350  WBC 13.2*  --  11.5*  HGB 8.4* 8.8* 7.4*  HCT 24.8* 26.0* 22.2*  PLT 101*  --  107*   BMET:  Recent Labs  10/31/15 0439 10/31/15 1622 11/01/15 0350  NA 136 136 135  K 4.2 4.3 3.9  CL 104 99* 103  CO2 25  --  27  GLUCOSE 172* 173* 146*  BUN 17 23* 20  CREATININE 1.07 1.20 1.02  CALCIUM 7.5*  --  7.8*    CMET: Lab Results  Component Value Date   WBC 11.5 (H) 11/01/2015   HGB 7.4 (L) 11/01/2015   HCT 22.2 (L) 11/01/2015   PLT 107 (L) 11/01/2015   GLUCOSE 146 (H) 11/01/2015   CHOL 228 (H) 07/23/2015   TRIG 77 07/23/2015   HDL 98 07/23/2015    LDLDIRECT 112.6 05/14/2012   LDLCALC 115 07/23/2015   ALT 25 10/28/2015   AST 24 10/28/2015   NA 135 11/01/2015   K 3.9 11/01/2015   CL 103 11/01/2015   CREATININE 1.02 11/01/2015   BUN 20 11/01/2015   CO2 27 11/01/2015   TSH 1.86 07/23/2015   PSA 0.64 09/23/2012   INR 1.94 10/29/2015   HGBA1C 11.2 (H) 10/29/2015   MICROALBUR 3.9 (H) 12/29/2013    PT/INR:  Recent Labs  10/29/15 1715  LABPROT 22.4*  INR 1.94   Radiology: No results found.   Assessment/Plan: S/P Procedure(s) (LRB): CORONARY ARTERY BYPASS GRAFTING (CABG) x Four UTILIZING THE LEFT INTERNAL MAMMARY ARTERY AND ENDOSCOPICALLY HARVESTED RIGHT SAPEHENEOUS VEINS. (N/A) AORTIC VALVE REPLACEMENT (AVR) WITH 23MM MAGNA EASE TISSUE VALVE. (N/A) REPLACEMENT OF ASCENDING AORTA USING 34MM X 30CM WOVEN DOUBLE VELOUR VASCULAR GRAFT. (N/A) TRANSESOPHAGEAL ECHOCARDIOGRAM (TEE) (N/A) Mobilize Diuresis Diabetes control d/c tubes/lines Expected Acute  Blood - loss Anemia- try to avoid transfusion, no blood so fay    [name] 11/01/2015 7:43 AM

## 2015-11-01 NOTE — Progress Notes (Signed)
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    PT Comments    Pt very pleasant and progressing well with ambulation transfers and function. Pt unable to state sternal precautions and educated for all with handout provided. Pt educated for bil LE HEP and encouraged ambulation with nursing. Will continue to follow.   HR 88-112 Sats 92-96% on RA BP 109/62  Follow Up Recommendations  Home health PT;Supervision/Assistance - 24 hour     Equipment Recommendations  Rolling walker with 5" wheels;3in1 (PT)    Recommendations for Other Services       Precautions / Restrictions Precautions Precautions: Sternal Restrictions Weight Bearing Restrictions: No    Mobility  Bed Mobility Overal bed mobility: Needs Assistance Bed Mobility: Rolling;Sidelying to Sit;Sit to Sidelying Rolling: Supervision Sidelying to sit: Min guard   Sit to supine: Min assist   General bed mobility comments: cues for sequence and precautions, assist to bring legs back onto bed  Transfers Overall transfer level: Needs assistance   Transfers: Sit to/from Stand Sit to Stand: Supervision         General transfer comment: cues for hand placement  Ambulation/Gait Ambulation/Gait assistance: Supervision Ambulation Distance (Feet): 300 Feet Assistive device: Rolling walker (2 wheeled) Gait Pattern/deviations: Step-through pattern;Decreased stride length   Gait velocity interpretation: Below normal speed for age/gender General Gait Details: cues for posture and increased stride. slow gait   Stairs            Wheelchair Mobility     Modified Rankin (Stroke Patients Only)       Balance                                    Cognition Arousal/Alertness: Awake/alert Behavior During Therapy: Flat affect Overall Cognitive Status: Within Functional Limits for tasks assessed                      Exercises General Exercises - Lower Extremity Long Arc Quad: AROM;15 reps;Both;Seated Hip Flexion/Marching: AROM;Both;Seated;15 reps    General Comments        Pertinent Vitals/Pain Pain Assessment: No/denies pain    Home Living                      Prior Function            PT Goals (current goals can now be found in the care plan section) Progress towards PT goals: Progressing toward goals    Frequency           PT Plan Current plan remains appropriate    Co-evaluation             End of Session   Activity Tolerance: Patient tolerated treatment well Patient left: in bed;with call bell/phone within reach;with family/visitor present     Time: [id number] PT Time Calculation (min) (ACUTE ONLY): 32 min  Charges:  $Gait Training: 8-22 mins $Therapeutic Exercise: 8-22 mins                    G Codes:      Melford Aase  11/01/2015, 12:31 PM Elwyn Reach, [place]

## 2015-11-01 NOTE — Progress Notes (Signed)
PT Cancellation Note  Patient Details Name: [name] MRN: [id number] DOB: November 13, 1945   Cancelled Treatment:    Reason Eval/Treat Not Completed: Other (comment) (pt just back to bed after walking with nursing. Will attempt later today as time allows)   Lanetta Inch [facility] 11/01/2015, 10:19 AM Elwyn Reach, [place]

## 2015-11-01 NOTE — Progress Notes (Signed)
      [address]       [place] [address]             [phone]       Resting with family in room  BP 109/62 (BP Location: Right Arm)   Pulse 88   Temp 98.6 F (37 C) (Oral)   Resp 16   Ht (!) 6" (0.152 m)   Wt 211 lb 3.2 oz (95.8 kg)   SpO2 92%   BMI 4124.73 kg/m    Intake/Output Summary (Last 24 hours) at 11/01/15 1851 Last data filed at 11/01/15 1000  Gross per 24 hour  Intake              830 ml  Output              860 ml  Net              -30 ml    Awaiting step down bed  Remo Lipps C. Roxan Hockey, MD [facility] [phone]

## 2015-11-02 ENCOUNTER — Inpatient Hospital Stay (HOSPITAL_COMMUNITY): Payer: Medicare Other

## 2015-11-02 LAB — BASIC METABOLIC PANEL
Anion gap: 9 (ref 5–15)
BUN: 18 mg/dL (ref 6–20)
CO2: 26 mmol/L (ref 22–32)
Calcium: 7.9 mg/dL — ABNORMAL LOW (ref 8.9–10.3)
Chloride: 101 mmol/L (ref 101–111)
Creatinine, Ser: 0.98 mg/dL (ref 0.61–1.24)
GFR calc Af Amer: >60 mL/min (ref >60)
GFR calc non Af Amer: >60 mL/min (ref >60)
Glucose, Bld: 129 mg/dL — ABNORMAL HIGH (ref 65–99)
Potassium: 4.3 mmol/L (ref 3.5–5.1)
Sodium: 136 mmol/L (ref 135–145)

## 2015-11-02 LAB — GLUCOSE, CAPILLARY
GLUCOSE-CAPILLARY: 126 mg/dL — AB (ref 65–99)
GLUCOSE-CAPILLARY: 181 mg/dL — AB (ref 65–99)
Glucose-Capillary: 173 mg/dL — ABNORMAL HIGH (ref 65–99)

## 2015-11-02 LAB — CBC
HCT: 22 % — ABNORMAL LOW (ref 39.0–52.0)
Hemoglobin: 7.2 g/dL — ABNORMAL LOW (ref 13.0–17.0)
MCH: 29.6 pg (ref 26.0–34.0)
MCHC: 32.7 g/dL (ref 30.0–36.0)
MCV: 90.5 fL (ref 78.0–100.0)
Platelets: 173 10*3/uL (ref 150–400)
RBC: 2.43 MIL/uL — ABNORMAL LOW (ref 4.22–5.81)
RDW: 14.4 % (ref 11.5–15.5)
WBC: 13 10*3/uL — ABNORMAL HIGH (ref 4.0–10.5)

## 2015-11-02 NOTE — Progress Notes (Signed)
Patient ID: [name], male   DOB: Aug 20, 1945, 70 y.o.   MRN: [id number] TCTS DAILY ICU PROGRESS NOTE                   [address]            [place] [address]          [phone]   4 Days Post-Op Procedure(s) (LRB): CORONARY ARTERY BYPASS GRAFTING (CABG) x Four UTILIZING THE LEFT INTERNAL MAMMARY ARTERY AND ENDOSCOPICALLY HARVESTED RIGHT SAPEHENEOUS VEINS. (N/A) AORTIC VALVE REPLACEMENT (AVR) WITH 23MM MAGNA EASE TISSUE VALVE. (N/A) REPLACEMENT OF ASCENDING AORTA USING 34MM X 30CM WOVEN DOUBLE VELOUR VASCULAR GRAFT. (N/A) TRANSESOPHAGEAL ECHOCARDIOGRAM (TEE) (N/A)  Total Length of Stay:  LOS: 6 days   Subjective: Waiting for step down bed   Objective: Vital signs in last 24 hours: Temp:  [98.6 F (37 C)-98.7 F (37.1 C)] 98.7 F (37.1 C) (09/19 0400) Pulse Rate:  [71-90] 82 (09/19 0700) Cardiac Rhythm: Normal sinus rhythm (09/19 0745) Resp:  [12-26] 20 (09/19 0700) BP: (106-143)/(56-75) 143/75 (09/19 0700) SpO2:  [88 %-100 %] 93 % (09/19 0700) Weight:  [211 lb 1.6 oz (95.8 kg)] 211 lb 1.6 oz (95.8 kg) (09/19 0630)  Filed Weights   10/31/15 0500 11/01/15 0600 11/02/15 0630  Weight: 212 lb 4.9 oz (96.3 kg) 211 lb 3.2 oz (95.8 kg) 211 lb 1.6 oz (95.8 kg)    Weight change: -1.6 oz (-0.046 kg)   Hemodynamic parameters for last 24 hours:    Intake/Output from previous day: 09/18 0701 - 09/19 0700 In: 380 [P.O.:360; I.V.:20] Out: 990 [Urine:950; Chest Tube:40]  Intake/Output this shift: No intake/output data recorded.  Current Meds: Scheduled Meds: . aspirin EC  81 mg Oral Daily  . atorvastatin  40 mg Oral Daily  . docusate sodium  200 mg Oral Daily  . ferrous gluconate  324 mg Oral BID WC  . folic acid  1 mg Oral Daily  . guaiFENesin  600 mg Oral BID  . insulin aspart  0-24 Units Subcutaneous TID AC & HS  . insulin detemir  8 Units Subcutaneous BID  . metoprolol tartrate  12.5 mg Oral BID  . pantoprazole  40 mg Oral QAC breakfast  .  sodium chloride flush  3 mL Intravenous Q12H   Continuous Infusions:  PRN Meds:.sodium chloride, bisacodyl **OR** bisacodyl, ondansetron **OR** ondansetron (ZOFRAN) IV, oxyCODONE, sodium chloride flush, traMADol  General appearance: alert, cooperative and no distress Neurologic: intact Heart: regular rate and rhythm, S1, S2 normal, no murmur, click, rub or gallop Lungs: diminished breath sounds bibasilar Abdomen: soft, non-tender; bowel sounds normal; no masses,  no organomegaly Extremities: extremities normal, atraumatic, no cyanosis or edema and Homans sign is negative, no sign of DVT Wound: sternum stable  Lab Results: CBC: Recent Labs  11/01/15 0350 11/02/15 0353  WBC 11.5* 13.0*  HGB 7.4* 7.2*  HCT 22.2* 22.0*  PLT 107* 173   BMET:  Recent Labs  11/01/15 0350 11/02/15 0353  NA 135 136  K 3.9 4.3  CL 103 101  CO2 27 26  GLUCOSE 146* 129*  BUN 20 18  CREATININE 1.02 0.98  CALCIUM 7.8* 7.9*    CMET: Lab Results  Component Value Date   WBC 13.0 (H) 11/02/2015   HGB 7.2 (L) 11/02/2015   HCT 22.0 (L) 11/02/2015   PLT 173 11/02/2015   GLUCOSE 129 (H) 11/02/2015   CHOL 228 (H) 07/23/2015   TRIG 77 07/23/2015  HDL 98 07/23/2015   LDLDIRECT 112.6 05/14/2012   LDLCALC 115 07/23/2015   ALT 25 10/28/2015   AST 24 10/28/2015   NA 136 11/02/2015   K 4.3 11/02/2015   CL 101 11/02/2015   CREATININE 0.98 11/02/2015   BUN 18 11/02/2015   CO2 26 11/02/2015   TSH 1.86 07/23/2015   PSA 0.64 09/23/2012   INR 1.94 10/29/2015   HGBA1C 11.2 (H) 10/29/2015   MICROALBUR 3.9 (H) 12/29/2013    PT/INR: No results for input(s): LABPROT, INR in the last 72 hours. Radiology: Dg Chest 2 View  Result Date: 11/02/2015 CLINICAL DATA:  Cardiac surgery. EXAM: CHEST  2 VIEW COMPARISON:  Nineteen 2017. FINDINGS: Prior CABG. Cardiac valve replacement. Stable cardiomegaly. Persistent bibasilar atelectasis and or infiltrate. Poor inspiration on lateral view. Bilateral pleural effusions  cannot be excluded. IMPRESSION: 1. Prior CABG. Prior cardiac valve replacement. Stable cardiomegaly. No pulmonary venous congestion. 2. Stable bibasilar atelectasis and/or mild infiltrates. Poor inspiration on lateral view. Bilateral pleural effusions cannot be excluded. Electronically Signed   By: Marcello Moores  Register   On: 11/02/2015 07:36     Assessment/Plan: S/P Procedure(s) (LRB): CORONARY ARTERY BYPASS GRAFTING (CABG) x Four UTILIZING THE LEFT INTERNAL MAMMARY ARTERY AND ENDOSCOPICALLY HARVESTED RIGHT SAPEHENEOUS VEINS. (N/A) AORTIC VALVE REPLACEMENT (AVR) WITH 23MM MAGNA EASE TISSUE VALVE. (N/A) REPLACEMENT OF ASCENDING AORTA USING 34MM X 30CM WOVEN DOUBLE VELOUR VASCULAR GRAFT. (N/A) TRANSESOPHAGEAL ECHOCARDIOGRAM (TEE) (N/A) Mobilize Diuresis Plan for transfer to step-down: see transfer orders H/H still low but will try to avoid blood transfusion  Waiting for step down bed    [name] 11/02/2015 7:52 AM

## 2015-11-02 NOTE — Progress Notes (Signed)
Pt transferred to 2w22 from [place]. Pt oriented to room. Hooked up to tele monitor. VSS. Given pain medicine for a 9/10 HA. Call bell and phone within reach will continue to monitor.

## 2015-11-02 NOTE — Op Note (Signed)
[name], [name] NO.:  [id number]  MEDICAL RECORD NO.:  [id number]  LOCATION:  [place] [facility]                       FACILITY:  [facility]  PHYSICIAN:  [name], MD    DATE OF BIRTH:  01/12/1946  DATE OF PROCEDURE:  10/29/2015 DATE OF DISCHARGE:                              OPERATIVE REPORT   PREOPERATIVE DIAGNOSES:  Severe 3-vessel coronary occlusive disease. Bicuspid aortic valve with moderate to severe aortic stenosis.  Dilated ascending aorta 4.7 cm.  PROCEDURE PERFORMED: 1. Coronary artery bypass grafting x4 with left internal mammary to     the left anterior descending coronary artery, sequential reverse     saphenous vein graft to the first and second obtuse marginal,     reverse saphenous vein graft to the distal right coronary artery     with right vein greater saphenous endo vein harvesting. 2. Replacement of aortic valve with pericardial tissue valve Edwards     [employer], model 3300 TFX 23 mm, serial [id number] supra coronary     replacement of ascending aorta with 34-mm Hemashield graft,     Wheat procedure.  SURGEON:  [name], MD.  FIRST ASSISTANT:  Jadene Pierini, PA-C.  SECOND ASSISTANT:  Jamison Oka, PA  BRIEF HISTORY:  The patient is a 70 year old male who I had previously seen for dilated ascending aorta and bicuspid valve.  The patient's CT of the chest revealed a 4.7-cm ascending aorta.  Echocardiogram in June showed moderate to severe aortic stenosis.  The patient has been followed by Cardiology and ultimately underwent cardiac catheterization after he started having anginal symptoms.  Cardiac catheterization revealed severe three-vessel coronary artery disease.  With the patient's combination of moderate to severe aortic stenosis, bicuspid aortic valve, dilated ascending aorta and severe three-vessel coronary artery disease, coronary artery bypass grafting and valve replacement and ascending aorta replacement was recommended to  the patient who agreed and signed informed consent.  He preferred not to take Coumadin.  DESCRIPTION OF PROCEDURE:  With Swan-Ganz and arterial line monitor in place, the patient underwent general endotracheal anesthesia without incident.  The skin of the chest and legs was prepped with Betadine and draped in usual sterile manner.  Using the Guidant endo vein harvesting system, a segment of vein was harvested from the right thigh and calf, right greater saphenous vein was used.  Median sternotomy was performed. Left internal mammary artery was dissected down as a pedicle graft.  The distal artery was divided and had good free flow.  The patient did have extensive pleural calcific plaques that were visibly obvious while dissecting down the mammary.  The pericardium was then opened.  We then heparinized the patient, cannulated the ascending aorta adjacent to the takeoff of the common trunk of the innominate and left carotid artery. A dual-stage venous cannula was placed.  Retrograde cardioplegia catheter was placed.  The patient was placed on cardiopulmonary bypass 2.4 L/min/m2.  Sites of anastomosis were selected and dissected at the epicardium.  A right superior pulmonary vein vent was also placed. Position was confirmed with TEE.  The patient's body temperature was then cooled to 32 degrees.  Aortic cross-clamp was applied, 500 mL cold blood potassium  cardioplegia was administered antegrade.  Additional retrograde cardioplegia was also given.  We proceeded first with coronary artery bypass grafting.  The distal right coronary artery was identified.  The vessel was opened, admitted a 1.5 mm probe distally. Using a running 7-0 Prolene, distal anastomosis was performed. Attention was then turned to the first and second obtuse marginal vessels.  Each were good size vessel but with significant calcification. Using a side-to-side anastomosis with the second reverse saphenous vein graft to  first obtuse marginal was grafted with a running 7-0 Prolene. Distal extent of the same vein was then carried short distance to the second obtuse marginal which was opened and also admitted a 1.5-mm probe.  Using a running 7-0 Prolene, a distal anastomosis was performed. Attention was then turned to the left anterior descending coronary artery.  This vessel was significantly calcified requiring an anastomosis in the distal third of the vessel.  The LAD was opened and admitted a 1.5-mm probe proximally and 1 mm probe distally.  Using a running 8-0 Prolene, left internal mammary artery was anastomosed to the left anterior descending coronary artery.  Fascia of the mammary was tacked to the epicardium.  During this intermittent cold blood cardioplegia was administered down the vein grafts and retrograde.  We then turned our attention to the aortic valve.  The ascending aorta in its supra-coronary position was then divided, this gave good visualization of a bicuspid aortic valve with the opening transverse in nature and a small hyperplastic raphe extending to just underneath the takeoff of the right coronary artery.  The valve was moderately calcified.  The valve was removed, anulus sized for 23 pericardial tissue valve.  The aorta was trimmed for a Wheat procedure with supra-coronary placement of a 32-mm Hemashield graft.  The aortic valve was then secured in place.  A #2 Tycron pledgeted sutures with pledgets on the ventricular surface were placed circumferentially and the valve seated well without impingement on the right or left coronary artery.  A proximal anastomosis with the Hemashield graft was then completed over felt strips with a running 3-0 Prolene suture.  The graft was trimmed to appropriate length, and a distal anastomosis was performed with felt strips and also a running 3-0 Prolene.  In the graft material using eye cautery two aortotomies were created for the  proximal anastomosis of the vein graft.  Each of these were sewed in place.  A small McGee needle was left to assist in de-airing.  Warm retrograde cardioplegia was administered.  The bulldog on the mammary artery was removed with prompt rise in myocardial septal temperature.  The heart was allowed to passively fill and de-air.  Aortic cross-clamp was removed with a cross-clamp time of 198 minutes.  The patient required electric defibrillation and returned to a sinus rhythm.  Sites of anastomoses were inspected and were free of bleeding.  With the body temperature rewarmed at 37 degrees, the heart was allowed to fill.  TEE showed functioning left ventricle.  There was a small central jet with the pericardial tissue valve but no definite perivalvular leak.  The right superior pulmonary vein vent was removed.  The retrograde cardioplegia catheter was removed.  The patient had atrial and ventricular pacing wires applied.  He was then ventilated and weaned from cardiopulmonary bypass and remained hemodynamically stable.  He was decannulated in usual fashion.  Protamine sulfate was administered. With the operative field hemostatic, two Blake mediastinal drains were left in place, a left pleural tube was left  in place.  Pericardium was loosely reapproximated.  Sternum was closed with #6 stainless steel wire.  Fascia was closed with interrupted 0 Vicryl and a 3-0 Vicryl in the subcutaneous tissue, and 3-0 subcuticular stitch in skin edges.  Dry dressings were applied.  Sponge and needle count was reported as correct at the completion of the procedure.  Total pump time was 251 minutes. The patient tolerated the procedure without obvious complication and was transferred to the Surgical Intensive Care unit for further postoperative care.  No intraoperative blood products were given with the exception of Cell Savers.     [name], MD    EG/MEDQ  D:  11/01/2015  T:  11/01/2015  Job:   [id number]  cc:   Thayer Headings, M.D.

## 2015-11-02 NOTE — Care Management Note (Addendum)
Case Management Note  Patient Details  Name: [name] MRN: [id number] Date of Birth: 03/03/1945  Subjective/Objective:    3 days s/p CABG and AVR               Action/Plan:  PTA from home with wife.  CM will continue to follow for discharge needs   Expected Discharge Date:                  Expected Discharge Plan:  [place]  In-House Referral:     Discharge planning Services  CM Consult  Post Acute Care Choice:    Choice offered to:     DME Arranged:    DME Agency:     HH Arranged:    HH Agency:     Status of Service:  In process, will continue to follow  If discussed at Long Length of Stay Meetings, dates discussed:    Additional Comments: CM spoke with pt at bedside - alert and oriented.  Pt states he was completely independent prior to admit, wife will be with him for any recommended time period post discharge.  Pt is agreeable to recommended HHPT and equipment - asked that CM leave [facility] choice list so that he can review it with wife - list given directly to pt.  CM will request HH/DME  orders via physician sticky note.  CM will continue to monitor for disposition needs Maryclare Labrador, RN 11/02/2015, 11:30 AM

## 2015-11-03 ENCOUNTER — Encounter: Payer: Self-pay | Admitting: Physician Assistant

## 2015-11-03 LAB — BASIC METABOLIC PANEL
Anion gap: 7 (ref 5–15)
BUN: 15 mg/dL (ref 6–20)
CO2: 27 mmol/L (ref 22–32)
Calcium: 7.9 mg/dL — ABNORMAL LOW (ref 8.9–10.3)
Chloride: 99 mmol/L — ABNORMAL LOW (ref 101–111)
Creatinine, Ser: 0.93 mg/dL (ref 0.61–1.24)
GFR calc Af Amer: >60 mL/min (ref >60)
GFR calc non Af Amer: >60 mL/min (ref >60)
Glucose, Bld: 141 mg/dL — ABNORMAL HIGH (ref 65–99)
Potassium: 4.4 mmol/L (ref 3.5–5.1)
Sodium: 133 mmol/L — ABNORMAL LOW (ref 135–145)

## 2015-11-03 LAB — GLUCOSE, CAPILLARY
GLUCOSE-CAPILLARY: 141 mg/dL — AB (ref 65–99)
GLUCOSE-CAPILLARY: 148 mg/dL — AB (ref 65–99)
Glucose-Capillary: 156 mg/dL — ABNORMAL HIGH (ref 65–99)
Glucose-Capillary: 179 mg/dL — ABNORMAL HIGH (ref 65–99)

## 2015-11-03 LAB — CBC
HCT: 20.5 % — ABNORMAL LOW (ref 39.0–52.0)
Hemoglobin: 6.9 g/dL — CL (ref 13.0–17.0)
MCH: 30.3 pg (ref 26.0–34.0)
MCHC: 33.7 g/dL (ref 30.0–36.0)
MCV: 89.9 fL (ref 78.0–100.0)
Platelets: 220 10*3/uL (ref 150–400)
RBC: 2.28 MIL/uL — ABNORMAL LOW (ref 4.22–5.81)
RDW: 14.9 % (ref 11.5–15.5)
WBC: 11.6 10*3/uL — ABNORMAL HIGH (ref 4.0–10.5)

## 2015-11-03 LAB — PREPARE RBC (CROSSMATCH)

## 2015-11-03 MED ORDER — FUROSEMIDE 40 MG PO TABS: 40.0000 mg | ORAL_TABLET | Freq: Every day | ORAL | Status: DC

## 2015-11-03 MED ORDER — SODIUM CHLORIDE 0.9 % IV SOLN
Freq: Once | INTRAVENOUS | Status: AC
  Administered 2015-11-03: 11:00:00 via INTRAVENOUS

## 2015-11-03 MED ORDER — SODIUM CHLORIDE 0.9 % IV SOLN
Freq: Once | INTRAVENOUS | Status: AC
  Administered 2015-11-03: 18:00:00 via INTRAVENOUS

## 2015-11-03 MED ORDER — POTASSIUM CHLORIDE CRYS ER 20 MEQ PO TBCR
20.0000 meq | EXTENDED_RELEASE_TABLET | Freq: Every day | ORAL | Status: DC
  Administered 2015-11-03 – 2015-11-08 (×6): 20 meq via ORAL
  Filled 2015-11-03 (×5): qty 1

## 2015-11-03 MED ORDER — METOPROLOL TARTRATE 25 MG PO TABS
25.0000 mg | ORAL_TABLET | Freq: Two times a day (BID) | ORAL | Status: DC
  Administered 2015-11-03 – 2015-11-04 (×2): 25 mg via ORAL
  Filled 2015-11-03 (×2): qty 1

## 2015-11-03 MED ORDER — METOPROLOL TARTRATE 5 MG/5ML IV SOLN
5.0000 mg | INTRAVENOUS | Status: DC | PRN
  Administered 2015-11-06: 5 mg via INTRAVENOUS
  Filled 2015-11-03 (×2): qty 5

## 2015-11-03 MED ORDER — LIVING WELL WITH DIABETES BOOK
Freq: Once | Status: DC
Start: 2015-11-03 — End: 2015-11-08
  Filled 2015-11-03: qty 1

## 2015-11-03 MED ORDER — DM-GUAIFENESIN ER 30-600 MG PO TB12
1.0000 | ORAL_TABLET | Freq: Two times a day (BID) | ORAL | Status: DC
  Administered 2015-11-04 – 2015-11-08 (×9): 1 via ORAL
  Filled 2015-11-03 (×10): qty 1

## 2015-11-03 MED ORDER — LISINOPRIL 5 MG PO TABS
5.0000 mg | ORAL_TABLET | Freq: Every day | ORAL | Status: DC
  Administered 2015-11-03 – 2015-11-07 (×5): 5 mg via ORAL
  Filled 2015-11-03 (×4): qty 1

## 2015-11-03 NOTE — Care Management Important Message (Signed)
Important Message  Patient Details  Name: [name] MRN: [id number] Date of Birth: 08/23/1945   Medicare Important Message Given:  Yes    Nathen May 11/03/2015, 9:27 AM

## 2015-11-03 NOTE — Progress Notes (Addendum)
Received critical hgb of 6.9.  MD Bartle paged/notified.  1 unit RBC ordered.

## 2015-11-03 NOTE — Progress Notes (Signed)
Physical Therapy Treatment Patient Details Name: CORDARO RENSBERGER MRN: [id number] DOB: Jun 02, 1945 Today's Date: 11/03/2015    History of Present Illness Mr. 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    PT Comments    Pt with low Hgb, but had not been symptomatic and session okayed with RN as long as pt was not symptomatic. In supine, pt denied dizziness or feeling weak. Pt sat up in bed and began coughing and felt dizzy and felt like he may pass out.  He returned supine and nurse who had been in room to give meds attended to him with PA returning to room while PT was exiting.  At beginning of session, PA in room and discussed with her, pt, and family stairs that pt has at home.  Will need to begin stair training.  Also discussed with family that HHPT has been recommended and they can also continue working on stairs with pt.  Session limited by dizziness and elevated HR after coughing fit, as well as low Hgb.  Follow Up Recommendations  Home health PT;Supervision/Assistance - 24 hour     Equipment Recommendations  Rolling walker with 5" wheels;3in1 (PT)    Recommendations for Other Services Other (comment) (cardiac rehab)     Precautions / Restrictions Precautions Precautions: Sternal Restrictions Weight Bearing Restrictions: No    Mobility  Bed Mobility Overal bed mobility: Needs Assistance Bed Mobility: Rolling;Sidelying to Sit;Sit to Sidelying Rolling: Supervision Sidelying to sit: Min assist     Sit to sidelying: Min assist General bed mobility comments: cues for precautions and Trunk for sidelying> sit and A for legs sit > sidelying  Transfers                 General transfer comment: deferred due to dizziness  Ambulation/Gait                  Stairs            Wheelchair Mobility    Modified Rankin (Stroke Patients Only)       Balance                                    Cognition Arousal/Alertness: Awake/alert Behavior During Therapy: WFL for tasks assessed/performed Overall Cognitive Status: Within Functional Limits for tasks assessed                      Exercises      General Comments General comments (skin integrity, edema, etc.): At EOB, pt began having coughing fit and then laid trunk down and PT A with legs.  Elevated HR and RN in to give meds and attended to pt.      Pertinent Vitals/Pain Pain Assessment: No/denies pain    Home Living                      Prior Function            PT Goals (current goals can now be found in the care plan section) Acute Rehab PT Goals Patient Stated Goal: get home PT Goal Formulation: With patient Time For Goal Achievement: 11/14/15 Potential to Achieve Goals: Good Progress towards PT goals: Not progressing toward goals - comment (dizziness)  Frequency    Min 3X/week      PT Plan Current plan remains appropriate    Co-evaluation             End of Session   Activity Tolerance: Treatment limited secondary to medical complications (Comment) Patient left: in bed;with call bell/phone within reach;with nursing/sitter in room;with family/visitor present;Other (comment) (PA )     Time: [id number] PT Time Calculation (min) (ACUTE ONLY): 20 min  Charges:  $Therapeutic Activity: 8-22 mins                    G Codes:      Kaliann Coryell LUBECK 11/03/2015, 10:57 AM

## 2015-11-03 NOTE — Progress Notes (Addendum)
Patient exerting to sit up when coughing occurred, patient felt dizzy and started to lean towards head of bed. Patient states, "felt like I was sinking into a hole." Pt. Heart rate jumped to 140's Afib. BP stable. Patient back in bed. PA notified. PA to bedside. Stated to rest in bed until blood administered. Orders placed. Call bell within reach. Will continue to monitor.   Domingo Dimes RN

## 2015-11-03 NOTE — Progress Notes (Signed)
Inpatient Diabetes Program Recommendations  AACE/ADA: New Consensus Statement on Inpatient Glycemic Control (2015)  Target Ranges:  Prepandial:   less than 140 mg/dL      Peak postprandial:   less than 180 mg/dL (1-2 hours)      Critically ill patients:  140 - 180 mg/dL   Lab Results  Component Value Date   GLUCAP 148 (H) 11/03/2015   HGBA1C 11.2 (H) 10/29/2015    Review of Glycemic Control  Inpatient Diabetes Program Recommendations:   Spoke with pt regarding A1C and explained what an A1C is, basic home care, basic diabetes diet nutrition principles, importance of checking CBGs and maintaining good CBG control to prevent long-term and short-term complications. Reviewed signs and symptoms of hyperglycemia and hypoglycemia and how to treat hypoglycemia at home. Also reviewed blood sugar goals at home.  RNs to provide ongoing basic DM education at bedside with this patient.   Patient and wife receptive to education. Nurses, please assist with DM videos. $0 Copay card for Lantus given to wife per request. Patient states he needs new prescription for strips for his meter on discharge.  Thank you, Nani Gasser. Audri Kozub, RN, MSN, CDE Inpatient Glycemic Control Team Team Pager [phone] (8am-5pm) 11/03/2015 3:03 PM

## 2015-11-03 NOTE — Progress Notes (Signed)
1055 Instructed pt PA to let pt rest today. Will follow up tomorrow. Had some SVT this morning and HgB low.Graylon Good RN BSN 11/03/2015 10:55 AM

## 2015-11-03 NOTE — Progress Notes (Addendum)
[address]       [place] [id number]             [phone]      5 Days Post-Op Procedure(s) (LRB): CORONARY ARTERY BYPASS GRAFTING (CABG) x Four UTILIZING THE LEFT INTERNAL MAMMARY ARTERY AND ENDOSCOPICALLY HARVESTED RIGHT SAPEHENEOUS VEINS. (N/A) AORTIC VALVE REPLACEMENT (AVR) WITH 23MM MAGNA EASE TISSUE VALVE. (N/A) REPLACEMENT OF ASCENDING AORTA USING 34MM X 30CM WOVEN DOUBLE VELOUR VASCULAR GRAFT. (N/A) TRANSESOPHAGEAL ECHOCARDIOGRAM (TEE) (N/A)   Subjective:  [name] has some complaints of difficulty expectorating sputum.  He also states he is having dizzy spells if he coughs to hard and sometimes on standing.  He is ambulating with minimal difficulty.   Objective: Vital signs in last 24 hours: Temp:  [98.2 F (36.8 C)-99.2 F (37.3 C)] 98.2 F (36.8 C) (09/20 0800) Pulse Rate:  [79-96] 94 (09/20 0950) Cardiac Rhythm: Normal sinus rhythm (09/20 0700) Resp:  [20-28] 22 (09/20 0852) BP: (131-152)/(60-72) 140/61 (09/20 0950) SpO2:  [90 %-94 %] 93 % (09/20 0800) Weight:  [210 lb 4.8 oz (95.4 kg)] 210 lb 4.8 oz (95.4 kg) (09/20 0454)  Intake/Output from previous day: 09/19 0701 - 09/20 0700 In: -  Out: 850 [Urine:850] Intake/Output this shift: Total I/O In: 120 [P.O.:120] Out: -   General appearance: alert, cooperative and no distress Heart: regular rate and rhythm Lungs: clear to auscultation bilaterally Abdomen: soft, non-tender; bowel sounds normal; no masses,  no organomegaly Extremities: edema trace-1+ Wound: clean, some drainage from mid portion of sternotomy  Lab Results:  Recent Labs  11/02/15 0353 11/03/15 0329  WBC 13.0* 11.6*  HGB 7.2* 6.9*  HCT 22.0* 20.5*  PLT 173 220   BMET:  Recent Labs  11/02/15 0353 11/03/15 0329  NA 136 133*  K 4.3 4.4  CL 101 99*  CO2 26 27  GLUCOSE 129* 141*  BUN 18 15  CREATININE 0.98 0.93  CALCIUM 7.9* 7.9*    PT/INR: No results for input(s): LABPROT, INR in the last 72  hours. ABG    Component Value Date/Time   PHART 7.343 (L) 10/30/2015 0046   HCO3 23.0 10/30/2015 0224   TCO2 25 10/31/2015 1622   ACIDBASEDEF 2.0 10/30/2015 0224   O2SAT 98.0 10/30/2015 0224   CBG (last 3)   Recent Labs  11/02/15 1629 11/02/15 2010 11/03/15 0610  GLUCAP 173* 181* 141*    Assessment/Plan: S/P Procedure(s) (LRB): CORONARY ARTERY BYPASS GRAFTING (CABG) x Four UTILIZING THE LEFT INTERNAL MAMMARY ARTERY AND ENDOSCOPICALLY HARVESTED RIGHT SAPEHENEOUS VEINS. (N/A) AORTIC VALVE REPLACEMENT (AVR) WITH 23MM MAGNA EASE TISSUE VALVE. (N/A) REPLACEMENT OF ASCENDING AORTA USING 34MM X 30CM WOVEN DOUBLE VELOUR VASCULAR GRAFT. (N/A) TRANSESOPHAGEAL ECHOCARDIOGRAM (TEE) (N/A)  1.  CV- NSR, remains hypertensive, nursing called me into room after patient had episode of SVT upon standing-  Will increase lopressor, will add low dose ace 2. Pulm- no acute issues, + cough, will change Mucinex to scheduled, continue IS 3. Renal- creatinine stable, remains hypervolemic, not currently on Lasix, will start 40 mg daily after blood transfusion is complete 4. Expected post operative blood loss anemia, moderate- Hgb at 6.9 patient with some dizziness, transfusion ordered overnight, repeat H/H in AM 5. DM- sugars currently controlled, continue insulin for now 6. GI- Lactulose prn constipation 7. Dispo- patient stable, will d/c EPW in AM, tranfuse packed cells for anemia. Sternal drainage likely fat necrosis will monitor, possibly ready for d/c home Friday   LOS: 7  days    [name]  11/03/2015  H?H down further this am, one unit of blood given will give second Holding in sinus now Recheck H?H in am I have seen and examined [name] and agree with the above assessment  and plan.  [name] MD Beeper [phone] Office [phone] 11/03/2015 4:12 PM

## 2015-11-04 ENCOUNTER — Inpatient Hospital Stay (HOSPITAL_COMMUNITY): Payer: Medicare Other

## 2015-11-04 LAB — BASIC METABOLIC PANEL
Anion gap: 9 (ref 5–15)
BUN: 11 mg/dL (ref 6–20)
CO2: 25 mmol/L (ref 22–32)
Calcium: 8.1 mg/dL — ABNORMAL LOW (ref 8.9–10.3)
Chloride: 103 mmol/L (ref 101–111)
Creatinine, Ser: 0.83 mg/dL (ref 0.61–1.24)
GFR calc Af Amer: >60 mL/min (ref >60)
GFR calc non Af Amer: >60 mL/min (ref >60)
Glucose, Bld: 143 mg/dL — ABNORMAL HIGH (ref 65–99)
Potassium: 4.5 mmol/L (ref 3.5–5.1)
Sodium: 137 mmol/L (ref 135–145)

## 2015-11-04 LAB — GLUCOSE, CAPILLARY
Glucose-Capillary: 158 mg/dL — ABNORMAL HIGH (ref 65–99)
Glucose-Capillary: 152 mg/dL — ABNORMAL HIGH (ref 65–99)
Glucose-Capillary: 164 mg/dL — ABNORMAL HIGH (ref 65–99)
Glucose-Capillary: 153 mg/dL — ABNORMAL HIGH (ref 65–99)

## 2015-11-04 LAB — TYPE AND SCREEN
ABO/RH(D): O POS
Antibody Screen: NEGATIVE
Unit division: 0
Unit division: 0

## 2015-11-04 LAB — CBC
HCT: 28 % — ABNORMAL LOW (ref 39.0–52.0)
Hemoglobin: 9.4 g/dL — ABNORMAL LOW (ref 13.0–17.0)
MCH: 29.7 pg (ref 26.0–34.0)
MCHC: 33.6 g/dL (ref 30.0–36.0)
MCV: 88.3 fL (ref 78.0–100.0)
Platelets: 261 10*3/uL (ref 150–400)
RBC: 3.17 MIL/uL — ABNORMAL LOW (ref 4.22–5.81)
RDW: 16.4 % — ABNORMAL HIGH (ref 11.5–15.5)
WBC: 11.6 10*3/uL — ABNORMAL HIGH (ref 4.0–10.5)

## 2015-11-04 MED ORDER — LACTULOSE 10 GM/15ML PO SOLN
20.0000 g | Freq: Every day | ORAL | Status: DC | PRN
  Administered 2015-11-04: 20 g via ORAL
  Filled 2015-11-04: qty 30

## 2015-11-04 MED ORDER — METOPROLOL TARTRATE 50 MG PO TABS
50.0000 mg | ORAL_TABLET | Freq: Two times a day (BID) | ORAL | Status: DC
  Administered 2015-11-04 – 2015-11-05 (×2): 50 mg via ORAL
  Filled 2015-11-04 (×2): qty 1

## 2015-11-04 MED ORDER — LEVALBUTEROL HCL 0.63 MG/3ML IN NEBU
0.6300 mg | INHALATION_SOLUTION | Freq: Three times a day (TID) | RESPIRATORY_TRACT | Status: DC
  Administered 2015-11-04 – 2015-11-05 (×3): 0.63 mg via RESPIRATORY_TRACT
  Filled 2015-11-04 (×3): qty 3

## 2015-11-04 MED ORDER — LEVALBUTEROL HCL 0.63 MG/3ML IN NEBU
INHALATION_SOLUTION | RESPIRATORY_TRACT | Status: AC
  Filled 2015-11-04: qty 3

## 2015-11-04 MED ORDER — AMIODARONE HCL 200 MG PO TABS
200.0000 mg | ORAL_TABLET | Freq: Two times a day (BID) | ORAL | Status: DC
  Administered 2015-11-04 – 2015-11-08 (×9): 200 mg via ORAL
  Filled 2015-11-04 (×9): qty 1

## 2015-11-04 NOTE — Progress Notes (Signed)
CARDIAC REHAB PHASE I   PRE:  Rate/Rhythm: 80 SR    BP: sitting 118/72    SaO2: 93 2L  MODE:  Ambulation: 300 ft   POST:  Rate/Rhythm: 130 afib walking, 96 SR upon return to room    BP: sitting 144/75     SaO2: 88-89 2L  Pt got off right side of better fairly well with instructions. Steady walking, no major c/o. SaO2 88-90 2L. Pt did have Afib/ST during walk, rate 130. Seemingly asx. Upon return to room in NSR. To recliner, encouraged x1 more walk and more IS. [id number]   Darrick Meigs CES, ACSM 11/04/2015 3:30 PM

## 2015-11-04 NOTE — Progress Notes (Signed)
PT Cancellation Note  Patient Details Name: [name] MRN: [id number] DOB: [birth year]   Cancelled Treatment:    Reason Eval/Treat Not Completed: Medical issues which prohibited therapy (pt with HR 78 currently however still experiencing Afib with movement per RN and request hold until after Amio received. Will attempt later this morning. )   Lanetta Inch Beth 11/04/2015, 7:40 AM Elwyn Reach, [place]

## 2015-11-04 NOTE — Progress Notes (Signed)
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    PT Comments    Pt fatigued today but willing to participate. Pt remains unable to state all sternal precautions and educated for all throughout session. Pt with sats 96% on 2L on arrival with transition to RA sats dropped to 82%, reapplied 2L with gait dropped to 86%, bumped to 4L to maintain 91% with gait, return to 96% on 2L end of session.  HR 78 at rest with rise to 100 during majority of gait. Last 50' HR up to 115 and with return to room HR up to 129 as pt sitting in chair. Pt able to recover to 89 within 1 min of seated rest. Pt stated some chest heaviness at end of ambulation and reported dizziness during period of oxygen desaturation. Deferred further gait or HEP due to Afib with rise in rate.   Will continue to follow and plan to address stairs in future session as cardiopulmonary status and activity tolerance allow.   Follow Up Recommendations  Home health PT;Supervision/Assistance - 24 hour     Equipment Recommendations  Rolling walker with 5" wheels;3in1 (PT)    Recommendations for Other Services       Precautions / Restrictions Precautions Precautions: Sternal;Fall Precaution Comments: watch rate and sats    Mobility  Bed Mobility Overal bed mobility: Needs Assistance Bed Mobility: Supine to Sit     Supine to sit: Min assist     General bed mobility comments: cues for sequence with assist to elevate trunk from surface  Transfers Overall transfer level: Needs assistance   Transfers: Sit to/from Stand Sit to Stand: Supervision         General  transfer comment: cues for scooting to edge of surface and anterior translation. pt prefers to hold heart pillow with transfers  Ambulation/Gait Ambulation/Gait assistance: Min guard Ambulation Distance (Feet): 150 Feet Assistive device: Rolling walker (2 wheeled) Gait Pattern/deviations: Step-through pattern;Decreased stride length   Gait velocity interpretation: Below normal speed for age/gender General Gait Details: cues for posture and increased stride. slow gait   Stairs            Wheelchair Mobility    Modified Rankin (Stroke Patients Only)       Balance                                    Cognition Arousal/Alertness: Awake/alert Behavior During Therapy: WFL for tasks assessed/performed Overall Cognitive Status: Within Functional Limits for tasks assessed       Memory: Decreased recall of precautions              Exercises      General Comments        Pertinent Vitals/Pain Pain Assessment: No/denies pain    Home Living                      Prior Function            PT Goals (current goals can now be found in the care plan section) Progress towards PT goals: Progressing toward goals  Frequency           PT Plan Current plan remains appropriate    Co-evaluation             End of Session Equipment Utilized During Treatment: Gait belt;Oxygen Activity Tolerance: Patient tolerated treatment well Patient left: in chair;with call bell/phone within Mathis;with family/visitor present     Time: [id number] PT Time Calculation (min) (ACUTE ONLY): 28 min  Charges:  $Gait Training: 8-22 mins $Therapeutic Activity: 8-22 mins                    G Codes:      [name] 12/02/15, 12:09 PM [name], [place]

## 2015-11-04 NOTE — Progress Notes (Signed)
Pt had brief burst of A Fib after standing to use the scale, lasting less than a minute.  Pt now in NSR, HR 79, VSS.  RN will continue to monitor.  Claudette Stapler, RN

## 2015-11-04 NOTE — Progress Notes (Addendum)
Patient Name: [name] Date of Encounter: 11/04/2015  Principal Problem:   Atypical angina ([place]) Active Problems:   Type II diabetes mellitus with neurological manifestations, uncontrolled ([place])   Hyperlipidemia   Essential hypertension   Aortic valve disease   Aortic aneurysm (H[facility])   Abnormal nuclear stress test   Coronary artery disease involving native heart with angina pectoris ([place])   Aortic stenosis   Coronary artery disease involving native coronary artery of native heart without angina pectoris   Length of Stay: 8  SUBJECTIVE Postop day #6 Sternotomy related soreness, mild. Feels dizzy with tachyarrhythmia. No dyspnea at rest.  CURRENT MEDS . amiodarone  200 mg Oral BID  . aspirin EC  81 mg Oral Daily  . atorvastatin  40 mg Oral Daily  . dextromethorphan-guaiFENesin  1 tablet Oral BID  . docusate sodium  200 mg Oral Daily  . ferrous gluconate  324 mg Oral BID WC  . folic acid  1 mg Oral Daily  . guaiFENesin  600 mg Oral BID  . insulin aspart  0-24 Units Subcutaneous TID AC & HS  . insulin detemir  8 Units Subcutaneous BID  . levalbuterol  0.63 mg Nebulization TID  . lisinopril  5 mg Oral Daily  . living well with diabetes book   Does not apply Once  . metoprolol tartrate  50 mg Oral BID  . pantoprazole  40 mg Oral QAC breakfast  . potassium chloride  20 mEq Oral Daily  . sodium chloride flush  3 mL Intravenous Q12H    OBJECTIVE   Intake/Output Summary (Last 24 hours) at 11/04/15 1053 Last data filed at 11/04/15 0745  Gross per 24 hour  Intake              670 ml  Output             1200 ml  Net             -530 ml   Filed Weights   11/02/15 0630 11/03/15 0454 11/04/15 0447  Weight: 95.8 kg (211 lb 1.6 oz) 95.4 kg (210 lb 4.8 oz) 94 kg (207 lb 4.8 oz)    PHYSICAL EXAM Vitals:   11/03/15 2014 11/03/15 2115 11/04/15 0447 11/04/15 0919  BP: 126/60 (!) 149/70 (!) 149/80 (!) 152/70  Pulse: 79 80 78 78  Resp: 20 19 19    Temp: 98.6 F (37 C)  98.9 F (37.2 C) 98.9 F (37.2 C)   TempSrc: Oral Oral Oral   SpO2: 98% 99% 100%   Weight:   94 kg (207 lb 4.8 oz)   Height:       General: Alert, oriented x3, no distress Head: no evidence of trauma, PERRL, EOMI, no exophtalmos or lid lag, no myxedema, no xanthelasma; normal ears, nose and oropharynx Neck: normal jugular venous pulsations and no hepatojugular reflux; brisk carotid pulses without delay and no carotid bruits Chest: sternotomy healing, clear to auscultation, no signs of consolidation by percussion or palpation, normal fremitus, symmetrical and full respiratory excursions Cardiovascular: normal position and quality of the apical impulse, regular rhythm, normal first and second heart sounds, no rubs or gallops, 2/6 aortic ejection murmur, no diastolic murmur Abdomen: no tenderness or distention, no masses by palpation, no abnormal pulsatility or arterial bruits, normal bowel sounds, no hepatosplenomegaly Extremities: no clubbing, cyanosis or edema; 2+ radial, ulnar and brachial pulses bilaterally; 2+ right femoral, posterior tibial and dorsalis pedis pulses; 2+ left femoral, posterior tibial and dorsalis pedis pulses;  no subclavian or femoral bruits Neurological: grossly nonfocal  LABS  CBC  Recent Labs  11/03/15 0329 11/04/15 0311  WBC 11.6* 11.6*  HGB 6.9* 9.4*  HCT 20.5* 28.0*  MCV 89.9 88.3  PLT 220 0000000   Basic Metabolic Panel  Recent Labs  11/03/15 0329 11/04/15 0311  NA 133* 137  K 4.4 4.5  CL 99* 103  CO2 27 25  GLUCOSE 141* 143*  BUN 15 11  CREATININE 0.93 0.83  CALCIUM 7.9* 8.1*   Radiology Studies Imaging results have been reviewed and No results found.  TELE NSR with occasional bursts of atrial fibrillation with RVR to 150s, sustained, butmostly <1 minute in duration  ECG pending  ASSESSMENT AND PLAN  1. Postop atrial fibrillation: with symptomatic RVR. Agree with amiodarone, but expect it will take a few days for PO amio to have any  impact. Will increase beta blocker further since both his HR and BP are relatively high. After temp pacer wires are removed, would recommend anticoagulation with Eliquis or Xarelto for about 30 days. CHADSVasc 5 (age, CAD, HTN, DM, LV systolic dysfunction).  2. S/P AVR &asc aorta repair: bioprosthetic AV. I do not think this qualifies as "valvular" atrial fibrillation. 3. CAD s/p CABG x 4: would not give ASA while on anticoagulation, but resume it if we stop the anticoagulant. 4. Desaturation with exercise: Needs 4L when walking in hallway slowly as sats drop to 80s. Atelectasis? Pleural effusion? Recheck CXR.   Sanda Klein, MD, [facility] [phone] office [phone] pager 11/04/2015 10:53 AM

## 2015-11-04 NOTE — Progress Notes (Signed)
1340 Offered to walk with pt. Declined at this time as he is tired. Just finished eating. Has walked once today. Encouraged pt to walk with staff later if unable to return. Will continue to follow. Graylon Good RN BSN 11/04/2015 1:41 PM

## 2015-11-04 NOTE — Progress Notes (Addendum)
[address]       [place] [address]             [phone]      6 Days Post-Op Procedure(s) (LRB): CORONARY ARTERY BYPASS GRAFTING (CABG) x Four UTILIZING THE LEFT INTERNAL MAMMARY ARTERY AND ENDOSCOPICALLY HARVESTED RIGHT SAPEHENEOUS VEINS. (N/A) AORTIC VALVE REPLACEMENT (AVR) WITH 23MM MAGNA EASE TISSUE VALVE. (N/A) REPLACEMENT OF ASCENDING AORTA USING 34MM X 30CM WOVEN DOUBLE VELOUR VASCULAR GRAFT. (N/A) TRANSESOPHAGEAL ECHOCARDIOGRAM (TEE) (N/A)   Subjective:  [name] is feeling okay.  He feels poorly when his HR goes up.  He had several episodes of this yesterday.  No BM yet  Objective: Vital signs in last 24 hours: Temp:  [98.2 F (36.8 C)-100.1 F (37.8 C)] 98.9 F (37.2 C) (09/21 0447) Pulse Rate:  [78-96] 78 (09/21 0447) Cardiac Rhythm: Normal sinus rhythm (09/20 1900) Resp:  [18-28] 19 (09/21 0447) BP: (116-158)/(60-81) 149/80 (09/21 0447) SpO2:  [93 %-100 %] 100 % (09/21 0447) Weight:  [207 lb 4.8 oz (94 kg)] 207 lb 4.8 oz (94 kg) (09/21 0447)  Intake/Output from previous day: 09/20 0701 - 09/21 0700 In: 1122 [P.O.:120; Blood:1002] Out: 750 [Urine:750]  General appearance: alert, cooperative and no distress Heart: regular rate and rhythm Lungs: clear to auscultation bilaterally Abdomen: soft, non-tender; bowel sounds normal; no masses,  no organomegaly Extremities: edema trace, improving Wound: clean and dry  Lab Results:  Recent Labs  11/03/15 0329 11/04/15 0311  WBC 11.6* 11.6*  HGB 6.9* 9.4*  HCT 20.5* 28.0*  PLT 220 261   BMET:  Recent Labs  11/03/15 0329 11/04/15 0311  NA 133* 137  K 4.4 4.5  CL 99* 103  CO2 27 25  GLUCOSE 141* 143*  BUN 15 11  CREATININE 0.93 0.83  CALCIUM 7.9* 8.1*    PT/INR: No results for input(s): LABPROT, INR in the last 72 hours. ABG    Component Value Date/Time   PHART 7.343 (L) 10/30/2015 0046   HCO3 23.0 10/30/2015 0224   TCO2 25 10/31/2015 1622   ACIDBASEDEF 2.0  10/30/2015 0224   O2SAT 98.0 10/30/2015 0224   CBG (last 3)   Recent Labs  11/03/15 1712 11/03/15 2016 11/04/15 0650  GLUCAP 156* 179* 158*    Assessment/Plan: S/P Procedure(s) (LRB): CORONARY ARTERY BYPASS GRAFTING (CABG) x Four UTILIZING THE LEFT INTERNAL MAMMARY ARTERY AND ENDOSCOPICALLY HARVESTED RIGHT SAPEHENEOUS VEINS. (N/A) AORTIC VALVE REPLACEMENT (AVR) WITH 23MM MAGNA EASE TISSUE VALVE. (N/A) REPLACEMENT OF ASCENDING AORTA USING 34MM X 30CM WOVEN DOUBLE VELOUR VASCULAR GRAFT. (N/A) TRANSESOPHAGEAL ECHOCARDIOGRAM (TEE) (N/A)  1. CV- appears to be having brief episodes of Atrial Fibrillation- will continue Lopressor, Lisinopril, add low dose Amiodarone 2. Pulm- wean oxygen as tolerated, patient with continued difficulty expectorating sputum, will add xopenex to see if that helps 3. Renal- creatinine has been WNL, weight is trending down, will recheck BMET in AM to ensure electrolytes are ok 4. Expected post operative blood loss anemia- Hgb up to 9.4 after transfusion 5. GI-lactulose prn constipation 6. DM- sugars remain controlled 7. Dispo- patient stable, need to monitor HR, will add low dose Amiodarone, leave wires in today, repeat blood work in AM   LOS: 8 days    [place], [place] 11/04/2015  H?H better today, still short episodes of afib- in afib mostly  consider d/c pacing wires in am If recurrent afib anticoagulated I have seen and examined [name] and agree with the above assessment  and plan.  [name] MD [facility] [phone] Office [phone] 11/04/2015 3:11 PM

## 2015-11-04 NOTE — Progress Notes (Signed)
SATURATION QUALIFICATIONS: (This note is used to comply with regulatory documentation for home oxygen)  Patient Saturations on Room Air at Rest = 82%  Patient Saturations on Room Air while Ambulating =  NA Patient Saturations on 4 Liters of oxygen while Ambulating = 91%  Please briefly explain why patient needs home oxygen: Pt desaturating on RA and currently requires 4L with mobility to maintain oxygenation [name], [place]

## 2015-11-05 ENCOUNTER — Inpatient Hospital Stay (HOSPITAL_COMMUNITY): Payer: Medicare Other

## 2015-11-05 DIAGNOSIS — I359 Nonrheumatic aortic valve disorder, unspecified: Secondary | ICD-10-CM

## 2015-11-05 DIAGNOSIS — I10 Essential (primary) hypertension: Secondary | ICD-10-CM

## 2015-11-05 DIAGNOSIS — E785 Hyperlipidemia, unspecified: Secondary | ICD-10-CM

## 2015-11-05 LAB — CBC
HCT: 32.2 % — ABNORMAL LOW (ref 39.0–52.0)
Hemoglobin: 10.4 g/dL — ABNORMAL LOW (ref 13.0–17.0)
MCH: 29.1 pg (ref 26.0–34.0)
MCHC: 32.3 g/dL (ref 30.0–36.0)
MCV: 89.9 fL (ref 78.0–100.0)
Platelets: 345 10*3/uL (ref 150–400)
RBC: 3.58 MIL/uL — ABNORMAL LOW (ref 4.22–5.81)
RDW: 16.1 % — ABNORMAL HIGH (ref 11.5–15.5)
WBC: 13.5 10*3/uL — ABNORMAL HIGH (ref 4.0–10.5)

## 2015-11-05 LAB — GLUCOSE, CAPILLARY
GLUCOSE-CAPILLARY: 112 mg/dL — AB (ref 65–99)
Glucose-Capillary: 130 mg/dL — ABNORMAL HIGH (ref 65–99)
Glucose-Capillary: 115 mg/dL — ABNORMAL HIGH (ref 65–99)
Glucose-Capillary: 210 mg/dL — ABNORMAL HIGH (ref 65–99)

## 2015-11-05 LAB — BASIC METABOLIC PANEL
Anion gap: 8 (ref 5–15)
BUN: 10 mg/dL (ref 6–20)
CO2: 28 mmol/L (ref 22–32)
Calcium: 8.7 mg/dL — ABNORMAL LOW (ref 8.9–10.3)
Chloride: 102 mmol/L (ref 101–111)
Creatinine, Ser: 0.9 mg/dL (ref 0.61–1.24)
GFR calc Af Amer: >60 mL/min (ref >60)
GFR calc non Af Amer: >60 mL/min (ref >60)
Glucose, Bld: 119 mg/dL — ABNORMAL HIGH (ref 65–99)
Potassium: 4.2 mmol/L (ref 3.5–5.1)
Sodium: 138 mmol/L (ref 135–145)

## 2015-11-05 MED ORDER — METOPROLOL TARTRATE 25 MG PO TABS
75.0000 mg | ORAL_TABLET | Freq: Two times a day (BID) | ORAL | Status: DC
  Administered 2015-11-05 – 2015-11-08 (×6): 75 mg via ORAL
  Filled 2015-11-05 (×6): qty 1

## 2015-11-05 MED ORDER — FUROSEMIDE 10 MG/ML IJ SOLN
40.0000 mg | Freq: Once | INTRAMUSCULAR | Status: AC
  Administered 2015-11-05: 40 mg via INTRAVENOUS
  Filled 2015-11-05: qty 4

## 2015-11-05 MED ORDER — LIDOCAINE HCL 1 % IJ SOLN
INTRAMUSCULAR | Status: AC
  Filled 2015-11-05: qty 20

## 2015-11-05 MED ORDER — RIVAROXABAN 20 MG PO TABS: 20.0000 mg | ORAL_TABLET | Freq: Every day | ORAL | Status: DC

## 2015-11-05 MED ORDER — RIVAROXABAN 20 MG PO TABS
20.0000 mg | ORAL_TABLET | Freq: Every day | ORAL | Status: DC
  Administered 2015-11-06 – 2015-11-07 (×2): 20 mg via ORAL
  Filled 2015-11-05 (×2): qty 1

## 2015-11-05 MED ORDER — LEVALBUTEROL HCL 0.63 MG/3ML IN NEBU: 0.6300 mg | INHALATION_SOLUTION | RESPIRATORY_TRACT | Status: DC | PRN

## 2015-11-05 NOTE — Progress Notes (Signed)
Per [employer] S/W LEY I.  @  [employer] # [phone]   XARELTO  20 MG DAILY ( 30 )   COVER- YES  CO-PAY- $ 40.00  TIER- 2 DRUG  PRIOR APPROVAL- YES # [phone]  PHARMACY : [facility] , [facility]  MAIL-ORDER FOR 90 DAY SUPPLY $ 80.00

## 2015-11-05 NOTE — Progress Notes (Signed)
ANTICOAGULATION CONSULT NOTE - Initial Consult  Pharmacy Consult for Xarelto Indication: atrial fibrillation  Allergies  Allergen Reactions  . No Known Allergies     Patient Measurements: Height: (!) 6" (15.2 cm) Weight: 202 lb 12.8 oz (92 kg) IBW/kg (Calculated) : -74.2   Vital Signs: Temp: 99.2 F (37.3 C) (09/22 0453) Temp Source: Oral (09/22 0453) BP: 127/66 (09/22 0453) Pulse Rate: 78 (09/22 0453)  Labs:  Recent Labs  11/03/15 0329 11/04/15 0311  HGB 6.9* 9.4*  HCT 20.5* 28.0*  PLT 220 261  CREATININE 0.93 0.83    CrCl cannot be calculated (Unknown ideal weight.).  Assessment: 70yom s/p CABG/AVR(tissue) now with post-op afib (CHADSVASC = 5) to begin xarelto. Cardiology planning to treat for 30 days. Renal function wnl. CBC from yesterday is stable.   Goal of Therapy:  Monitor platelets by anticoagulation protocol: Yes   Plan:  1) Xarelto 20mg  po daily 2) Case management consult ordered 3) Will provide education  Deboraha Sprang 11/05/2015,8:41 AM

## 2015-11-05 NOTE — Progress Notes (Addendum)
[address]       [place] [address]             [phone]      7 Days Post-Op Procedure(s) (LRB): CORONARY ARTERY BYPASS GRAFTING (CABG) x Four UTILIZING THE LEFT INTERNAL MAMMARY ARTERY AND ENDOSCOPICALLY HARVESTED RIGHT SAPEHENEOUS VEINS. (N/A) AORTIC VALVE REPLACEMENT (AVR) WITH 23MM MAGNA EASE TISSUE VALVE. (N/A) REPLACEMENT OF ASCENDING AORTA USING 34MM X 30CM WOVEN DOUBLE VELOUR VASCULAR GRAFT. (N/A) TRANSESOPHAGEAL ECHOCARDIOGRAM (TEE) (N/A)   Subjective:  [name] is doing okay.  Continues to have episodes of rapid Atrial Fibrillation with RVR, feels poorly when HR is high.  He is ambulating.  + BM  Objective: Vital signs in last 24 hours: Temp:  [98.7 F (37.1 C)-99.5 F (37.5 C)] 99.2 F (37.3 C) (09/22 0453) Pulse Rate:  [74-89] 78 (09/22 0453) Cardiac Rhythm: Normal sinus rhythm (09/22 0000) Resp:  [18-23] 18 (09/22 0453) BP: (123-152)/(63-76) 127/66 (09/22 0453) SpO2:  [95 %-97 %] 96 % (09/22 0453) Weight:  [202 lb 12.8 oz (92 kg)] 202 lb 12.8 oz (92 kg) (09/22 0453)  Intake/Output from previous day: 09/21 0701 - 09/22 0700 In: 240 [P.O.:240] Out: 2026 [Urine:2025; Stool:1]  General appearance: alert, cooperative and no distress Heart: regular rate and rhythm Lungs: diminished breath sounds worse on right Abdomen: soft, non-tender; bowel sounds normal; no masses,  no organomegaly Extremities: edema trace Wound: clean and dry  Lab Results:  Recent Labs  11/03/15 0329 11/04/15 0311  WBC 11.6* 11.6*  HGB 6.9* 9.4*  HCT 20.5* 28.0*  PLT 220 261   BMET:  Recent Labs  11/03/15 0329 11/04/15 0311  NA 133* 137  K 4.4 4.5  CL 99* 103  CO2 27 25  GLUCOSE 141* 143*  BUN 15 11  CREATININE 0.93 0.83  CALCIUM 7.9* 8.1*    PT/INR: No results for input(s): LABPROT, INR in the last 72 hours. ABG    Component Value Date/Time   PHART 7.343 (L) 10/30/2015 0046   HCO3 23.0 10/30/2015 0224   TCO2 25 10/31/2015 1622   ACIDBASEDEF 2.0 10/30/2015 0224   O2SAT 98.0 10/30/2015 0224   CBG (last 3)   Recent Labs  11/04/15 1628 11/04/15 2042 11/05/15 0619  GLUCAP 164* 153* 130*    Assessment/Plan: S/P Procedure(s) (LRB): CORONARY ARTERY BYPASS GRAFTING (CABG) x Four UTILIZING THE LEFT INTERNAL MAMMARY ARTERY AND ENDOSCOPICALLY HARVESTED RIGHT SAPEHENEOUS VEINS. (N/A) AORTIC VALVE REPLACEMENT (AVR) WITH 23MM MAGNA EASE TISSUE VALVE. (N/A) REPLACEMENT OF ASCENDING AORTA USING 34MM X 30CM WOVEN DOUBLE VELOUR VASCULAR GRAFT. (N/A) TRANSESOPHAGEAL ECHOCARDIOGRAM (TEE) (N/A)  1. CV- continues to have episodes of A. Fib with RVR, currently in NSR- continue Amiodarone, Lopressor, Lisinopril... Will start Xarelto per pharmacy, per cardiology recs 2. Pulm- hypoxia, continue to wean oxygen as tolerated, will continue nebs, CXR with pleural effusions, atelectasis... May benefit from Thoracentesis will discuss with Dr. Servando Snare 3. Renal- creatinine WNL, mild hypervolemia, will give dose of Lasix today 4. DM- sugars remain controlled 5. Dispo- continues to have A. Fib, will d/c EPW, get Xarelto started this evening, may benefit from Thoracentesis with bilateral pleural effusions   LOS: 9 days    [name], [place] 11/05/2015 check follow up cbc, now in sinus, episode of afib walking yesterday Small bil effusions R>L will do US guided thoracentesis today before starting blood thinner for episodic afib  Diuresis  I have seen and examined [name] and agree with the above assessment  and plan.  [name] MD [facility] [phone] Office [phone] 11/05/2015 8:41 AM

## 2015-11-05 NOTE — Discharge Instructions (Addendum)
Coronary Artery Bypass Grafting, Care After °Refer to this sheet in the next few weeks. These instructions provide you with information on caring for yourself after your procedure. Your health care provider may also give you more specific instructions. Your treatment has been planned according to current medical practices, but problems sometimes occur. Call your health care provider if you have any problems or questions after your procedure. °WHAT TO EXPECT AFTER THE PROCEDURE °Recovery from surgery will be different for everyone. Some people feel well after 3 or 4 weeks, while for others it takes longer. After your procedure, it is typical to have the following: °· Nausea and a lack of appetite.   °· Constipation. °· Weakness and fatigue.   °· Depression or irritability.   °· Pain or discomfort at your incision site. °HOME CARE INSTRUCTIONS °· Take medicines only as directed by your health care provider. Do not stop taking medicines or start any new medicines without first checking with your health care provider. °· Take your pulse as directed by your health care provider. °· Perform deep breathing as directed by your health care provider. If you were given a device called an incentive spirometer, use it to practice deep breathing several times a day. Support your chest with a pillow or your arms when you take deep breaths or cough. °· Keep incision areas clean, dry, and protected. Remove or change any bandages (dressings) only as directed by your health care provider. You may have skin adhesive strips over the incision areas. Do not take the strips off. They will fall off on their own. °· Check incision areas daily for any swelling, redness, or drainage. °· If incisions were made in your legs, do the following: °¨ Avoid crossing your legs.   °¨ Avoid sitting for long periods of time. Change positions every 30 minutes.   °¨ Elevate your legs when you are sitting. °· Wear compression stockings as directed by your  health care provider. These stockings help keep blood clots from forming in your legs. °· Take showers once your health care provider approves. Until then, only take sponge baths. Pat incisions dry. Do not rub incisions with a washcloth or towel. Do not take baths, swim, or use a hot tub until your health care provider approves. °· Eat foods that are high in fiber, such as raw fruits and vegetables, whole grains, beans, and nuts. Meats should be lean cut. Avoid canned, processed, and fried foods. °· Drink enough fluid to keep your urine clear or pale yellow. °· Weigh yourself every day. This helps identify if you are retaining fluid that may make your heart and lungs work harder. °· Rest and limit activity as directed by your health care provider. You may be instructed to: °¨ Stop any activity at once if you have chest pain, shortness of breath, irregular heartbeats, or dizziness. Get help right away if you have any of these symptoms. °¨ Move around frequently for short periods or take short walks as directed by your health care provider. Increase your activities gradually. You may need physical therapy or cardiac rehabilitation to help strengthen your muscles and build your endurance. °¨ Avoid lifting, pushing, or pulling anything heavier than 10 lb (4.5 kg) for at least 6 weeks after surgery. °· Do not drive until your health care provider approves.  °· Ask your health care provider when you may return to work. °· Ask your health care provider when you may resume sexual activity. °· Keep all follow-up visits as directed by your health care   provider. This is important. SEEK MEDICAL CARE IF:  You have swelling, redness, increasing pain, or drainage at the site of an incision.  You have a fever.  You have swelling in your ankles or legs.  You have pain in your legs.   You gain 2 or more pounds (0.9 kg) a day.  You are nauseous or vomit.  You have diarrhea. SEEK IMMEDIATE MEDICAL CARE IF:  You have  chest pain that goes to your jaw or arms.  You have shortness of breath.   You have a fast or irregular heartbeat.   You notice a "clicking" in your breastbone (sternum) when you move.   You have numbness or weakness in your arms or legs.  You feel dizzy or light-headed.  MAKE SURE YOU:  Understand these instructions.  Will watch your condition.  Will get help right away if you are not doing well or get worse.   This information is not intended to replace advice given to you by your health care provider. Make sure you discuss any questions you have with your health care provider.   Document Released: 08/19/2004 Document Revised: 02/20/2014 Document Reviewed: 07/09/2012 Elsevier Interactive Patient Education 2016 [place].  Aortic Valve Replacement, Care After Refer to this sheet in the next few weeks. These instructions provide you with information on caring for yourself after your procedure. Your health care provider may also give you specific instructions. Your treatment has been planned according to current medical practices, but problems sometimes occur. Call your health care provider if you have any problems or questions after your procedure. HOME CARE INSTRUCTIONS   Take medicines only as directed by your health care provider.  If your health care provider has prescribed elastic stockings, wear them as directed.  Take frequent naps or rest often throughout the day.  Avoid lifting over 10 lbs (4.5 kg) or pushing or pulling things with your arms for 6-8 weeks or as directed by your health care provider.  Avoid driving or airplane travel for 4-6 weeks after surgery or as directed by your health care provider. If you are riding in a car for an extended period, stop every 1-2 hours to stretch your legs. Keep a record of your medicines and medical history with you when traveling.  Do not drive or operate heavy machinery while taking pain medicine. (narcotics).  Do not  cross your legs.  Do not use any tobacco products including cigarettes, chewing tobacco, or electronic cigarettes. If you need help quitting, ask your health care provider.  Do not take baths, swim, or use a hot tub until your health care provider approves. Take showers once your health care provider approves. Pat incisions dry. Do not rub incisions with a washcloth or towel.  Avoid climbing stairs and using the handrail to pull yourself up for the first 2-3 weeks after surgery.  Return to work as directed by your health care provider.  Drink enough fluid to keep your urine clear or pale yellow.  Do not strain to have a bowel movement. Eat high-fiber foods if you become constipated. You may also take a medicine to help you have a bowel movement (laxative) as directed by your health care provider.  Resume sexual activity as directed by your health care provider. Men should not use medicines for erectile dysfunction until their doctor says it isokay.  If you had a certain type of heart condition in the past, you may need to take antibiotic medicine before having dental work  or surgery. Let your dentist and health care providers know if you had one or more of the following:  Previous endocarditis.  An artificial (prosthetic) heart valve.  Congenital heart disease. SEEK MEDICAL CARE IF:  You develop a skin rash.   You experience sudden changes in your weight.  You have a fever. SEEK IMMEDIATE MEDICAL CARE IF:   You develop chest pain that is not coming from your incision.  You have drainage (pus), redness, swelling, or pain at your incision site.   You develop shortness of breath or have difficulty breathing.   You have increased bleeding from your incision site.   You develop light-headedness.  MAKE SURE YOU:   Understand these directions.  Will watch your condition.  Will get help right away if you are not doing well or get worse.   This information is not  intended to replace advice given to you by your health care provider. Make sure you discuss any questions you have with your health care provider.   Document Released: 08/18/2004 Document Revised: 02/20/2014 Document Reviewed: 11/14/2011 Elsevier Interactive Patient Education 2016 [place]. MAY RESUME SAT. MORNING   Information on my medicine - XARELTO (Rivaroxaban)  This medication education was reviewed with me or my healthcare representative as part of my discharge preparation.  The pharmacist that spoke with me during my hospital stay was:  Deboraha Sprang, [facility]  Why was Xarelto prescribed for you? Xarelto was prescribed for you to reduce the risk of a blood clot forming that can cause a stroke if you have a medical condition called atrial fibrillation (a type of irregular heartbeat).  What do you need to know about xarelto ? Take your Xarelto ONCE DAILY at the same time every day with your evening meal. If you have difficulty swallowing the tablet whole, you may crush it and mix in applesauce just prior to taking your dose.  Take Xarelto exactly as prescribed by your doctor and DO NOT stop taking Xarelto without talking to the doctor who prescribed the medication.  Stopping without other stroke prevention medication to take the place of Xarelto may increase your risk of developing a clot that causes a stroke.  Refill your prescription before you run out.  After discharge, you should have regular check-up appointments with your healthcare provider that is prescribing your Xarelto.  In the future your dose may need to be changed if your kidney function or weight changes by a significant amount.  What do you do if you miss a dose? If you are taking Xarelto ONCE DAILY and you miss a dose, take it as soon as you remember on the same day then continue your regularly scheduled once daily regimen the next day. Do not take two doses of Xarelto  at the same time or on the same day.   Important Safety Information A possible side effect of Xarelto is bleeding. You should call your healthcare provider right away if you experience any of the following: ? Bleeding from an injury or your nose that does not stop. ? Unusual colored urine (red or dark brown) or unusual colored stools (red or black). ? Unusual bruising for unknown reasons. ? A serious fall or if you hit your head (even if there is no bleeding).  Some medicines may interact with Xarelto and might increase your risk of bleeding while on Xarelto. To help avoid this, consult your healthcare provider or pharmacist prior to using  any new prescription or non-prescription medications, including herbals, vitamins, non-steroidal anti-inflammatory drugs (NSAIDs) and supplements.  This website has more information on Xarelto: [email or web address]

## 2015-11-05 NOTE — Discharge Summary (Signed)
Physician Discharge Summary       [address]       [place] [address]             [phone]    Patient ID: [name] MRN: [id number] DOB/AGE: 68-Aug-1947 68 y.o.  Admit date: 10/27/2015 Discharge date: 11/08/2015  Admission Diagnoses: 1. Coronary artery disease 2. Atypical angina ([place]) 3. Bicuspid aortic valve 4. Moderate to severe aortic stenosis 5. Dilated ascending aorta 4.7 cm  Active Diagnoses:  1. Type II diabetes mellitus with neurological manifestations, uncontrolled ([place]) 2. Hyperlipidemia 3. Essential hypertension 4. Cancer ([place]) 5. Erectile dysfunction 6. ABL anemia    Procedure (s):  1. Coronary artery bypass grafting x4 with left internal mammary to     the left anterior descending coronary artery, sequential reverse     saphenous vein graft to the first and second obtuse marginal,     reverse saphenous vein graft to the distal right coronary artery     with right vein greater saphenous endo vein harvesting. 2. Replacement of aortic valve with pericardial tissue valve Edwards     [employer], model 3300 TFX 23 mm, serial [id number] supra coronary     replacement of ascending aorta with 34-mm Hemashield graft,      by Dr. Servando Snare on 10/29/2015.  History of Presenting Illness: This is a 70 yo [place] American male well known to [facility].  Dr. Servando Snare saw the patient in July of this year for an ascending aortic aneurysm.  CT scan obtained at that time measured the aneurysm to be 4.7 cm which was stable from previous scan obtained in 2013.  At that time, it was recommended the patient undergo close follow up with serial CT scans.  The patient has been experiencing chest pain more frequently.  He therefore had a stress test by Dr. Acie Fredrickson last week with was positive and he was scheduled for elective cardiac catheterization.  This was done today and showed severe CAD.  It was felt this combined with his aortic stenosis and aortic aneurysm he would  benefit from coronary bypass grafting and [facility] consult was obtained. Currently, the patient continues to have some mild chest pain.  He states this has been present for the past year.  He also get short of breath walking up a flight of stairs, but states he can walk his block without difficulty.  He is a diabetic on both oral medications and insulin therapy.  He denies smoking history.  He states he has not seen a dentist in many years.  His top teeth are dentures.  There is only 1 tooth left on the bottom per patient which is broken off.    Serial echocardiograms 2013, 2015, in 2017- in 2013 a peak aortic pressure was 32 with a mean of 22 and question of a bicuspid valve, and 2015 the patient echo report notes a trileaflet valve peak aortic pressure of 28 and a mean of 18. On the most current echocardiogram the patient has moderate aortic stenosis with a peak pressure of 44 and a mean of 24. Peak aortic valve velocity is 331 cm/s.   Patient does have a family history of aneurysm disease, though the details of this are not clear, he notes that his mother had some previous cardiac surgery and then 5 years later had a ruptured aneurysm and died at age 10. The details of this are unclear. The patient has one brother who died at age 63 of unexplained sudden  death.  Dr. Servando Snare discussed with the patient and his wife the risks and options of proceeding with coronary artery bypass grafting and aortic valve replacement and replacement of ascending aorta. We discussed the use of mechanical versus tissue valve, patient is agreeable with proceeding with a tissue valve. Pre operative carotid duplex showed no significant internal carotid artery stenosis bilaterally. He underwent the aforementioned surgery on 10/29/2015.  Brief Hospital Course:  The patient was extubated early the morning of post operative day one. He remained afebrile and hemodynamically stable. He was weaned off of Dopamine drip. Gordy Councilman, a line,   and foley were removed early in the post operative course. Chest tubes remained for a couple of days and then were removed. Lopressor was started and titrated accordingly. He was volume over loaded and diuresed. He had ABL anemia. He did require a post op transfusion. His last H and H was up to 10.4 and 32.2. He was weaned off the insulin drip. The patient's HGA1C pre op was  11.2. He will require close follow up with his medical doctor after discharge. The patient was felt surgically stable for transfer from the ICU to [facility] for further convalescence on 11/03/2015. He then went into a fib with RVR. He was already on Lopressor and Amiodarone. He was started on Xarelto. He continues to progress with cardiac rehab. He was ambulating on 3 liters via [place]. We will attempt to wean him to room air. He has been tolerating a diet and has had a bowel movement. Epicardial pacing wires and chest tube sutures will be removed prior to discharge. The patient is felt surgically stable for discharge today.    Latest Vital Signs: Blood pressure (!) 113/52, pulse 73, temperature 99 F (37.2 C), temperature source Oral, resp. rate 20, height (!) 6" (0.152 m), weight 197 lb 1.5 oz (89.4 kg), SpO2 95 %.  Physical Exam: General appearance: alert, cooperative and no distress Heart: regular rate and rhythm Lungs: diminished breath sounds worse on right Abdomen: soft, non-tender; bowel sounds normal; no masses,  no organomegaly Extremities: edema trace Wound: clean and dry  Discharge Condition:Stable and discharged to home.  Recent laboratory studies:  Lab Results  Component Value Date   WBC 13.3 (H) 11/06/2015   HGB 9.8 (L) 11/06/2015   HCT 31.0 (L) 11/06/2015   MCV 91.2 11/06/2015   PLT 381 11/06/2015   Lab Results  Component Value Date   NA 139 11/06/2015   K 4.1 11/06/2015   CL 99 (L) 11/06/2015   CO2 29 11/06/2015   CREATININE 1.00 11/06/2015   GLUCOSE 104 (H) 11/06/2015    Diagnostic Studies: Dg  Chest 2 View  Result Date: 11/04/2015 CLINICAL DATA:  Hypoxia, dizziness EXAM: CHEST  2 VIEW COMPARISON:  11/02/2015 FINDINGS: Status post median sternotomy and cardiac valve replacement. Cardiomediastinal silhouette is stable. Stable title again noted some right perihilar scarring. Bilateral small pleural effusion with bilateral basilar atelectasis or infiltrate. No pulmonary edema. IMPRESSION: No pulmonary edema. Bilateral small pleural effusion with bilateral basilar atelectasis or infiltrate. Electronically Signed   By: Lahoma Crocker M.D.   On: 11/04/2015 12:51  Discharge Instructions    Amb Referral to Cardiac Rehabilitation    Complete by:  As directed    Diagnosis:   CABG Comment - c/ replacement of ascending aorta  Valve Replacement     Valve:  Aortic   CABG X ___:  4     Discharge Medications:   Medication List  STOP taking these medications   amLODipine 2.5 MG tablet Commonly known as:  NORVASC   carvedilol 6.25 MG tablet Commonly known as:  COREG     TAKE these medications   amiodarone 200 MG tablet Commonly known as:  PACERONE Take 1 tablet (200 mg total) by mouth 2 (two) times daily.   aspirin 81 MG tablet Take 81 mg by mouth daily.   atorvastatin 40 MG tablet Commonly known as:  LIPITOR Take 1 tablet (40 mg total) by mouth daily.   famotidine 10 MG chewable tablet Commonly known as:  PEPCID AC Chew 1 tablet (10 mg total) by mouth 2 (two) times daily as needed for heartburn.   ferrous gluconate 324 MG tablet Commonly known as:  FERGON Take 1 tablet (324 mg total) by mouth 2 (two) times daily with a meal.   folic acid 1 MG tablet Commonly known as:  FOLVITE Take 1 tablet (1 mg total) by mouth daily.   furosemide 40 MG tablet Commonly known as:  LASIX Take 1 tablet (40 mg total) by mouth daily.   glucose blood test strip Use as instructed   Insulin Glargine 100 UNIT/ML Solostar Pen Commonly known as:  LANTUS SOLOSTAR Inject 25 Units into the skin  daily at 10 pm.   insulin lispro 100 UNIT/ML KiwkPen Commonly known as:  HUMALOG KWIKPEN Use 5 to 10 units three times daily 10-15 minutes before meals What changed:  how much to take  how to take this  when to take this  additional instructions   HUMALOG KWIKPEN 100 UNIT/ML KiwkPen Generic drug:  insulin lispro USE 5 TO 10 UNITS THREE TIMES DAILY 10-15 MINUTES BEFORE MEALS What changed:  Another medication with the same name was changed. Make sure you understand how and when to take each.   losartan 25 MG tablet Commonly known as:  COZAAR Take 1 tablet (25 mg total) by mouth daily. What changed:  medication strength  how much to take   metFORMIN 1000 MG tablet Commonly known as:  GLUCOPHAGE Take 1 tablet (1,000 mg total) by mouth 2 (two) times daily with a meal.   Metoprolol Tartrate 75 MG Tabs Take 75 mg by mouth 2 (two) times daily.   oxyCODONE 5 MG immediate release tablet Commonly known as:  Oxy IR/ROXICODONE Take 1-2 tablets (5-10 mg total) by mouth every 6 (six) hours as needed for severe pain.   potassium chloride SA 20 MEQ tablet Commonly known as:  K-DUR,KLOR-CON Take 1 tablet (20 mEq total) by mouth daily.   rivaroxaban 20 MG Tabs tablet Commonly known as:  XARELTO Take 1 tablet (20 mg total) by mouth daily with supper.      The patient has been discharged on:   1.Beta Blocker:  Yes [ x  ]                              No   [   ]                              If No, reason:  2.Ace Inhibitor/ARB: Yes [ x  ]                                     No  [    ]  If No, reason:  3.Statin:   Yes [  x ]                  No  [   ]                  If No, reason:  4.Ecasa:  Yes  [x   ]                  No   [   ]                  If No, reason:  Follow Up Appointments: Follow-up Information    [name], MD Follow up on 12/09/2015.   Specialty:  Cardiothoracic Surgery Why:  PA/LAT CXR to be taken (at  [facility] which is in the same building as Dr. Everrett Coombe office) on 12/09/2015 at 10:00 am;Appointment time is at 10:30 am Contact information: [place] [address] [phone]        Mertie Moores, MD .   Specialty:  Cardiology Why:  The office will call you to make an appointment in 2 weeks.  Contact information: [place] [address] [phone]        Drema Pry, DO .   Specialty:  Internal Medicine Why:  Call for a follow up appointment for 1-2 weeks regarding diabetes management and further surveillance of HGA1C 11.2 Contact information: [place] [address] [phone]           Signed: Jadene Pierini EPA-C 11/08/2015, 8:04 AM

## 2015-11-05 NOTE — Progress Notes (Signed)
      [address]       [place] [address]             [phone]     thoracentesis done this afternoon , tolerated , 700 ml from right Holding sinus currently Will delay anticoagulation until tomorrow with recent thoracentesis this afternoon  [name] MD      [address] [place], [address] Office [phone]   [place]

## 2015-11-05 NOTE — Care Management Note (Addendum)
Case Management Note Previous CM note initiated by Maryclare Labrador, RN 11/02/2015, 11:30 AM   Patient Details  Name: [name] MRN: [id number] Date of Birth: 19-Mar-1945  Subjective/Objective:    3 days s/p CABG and AVR               Action/Plan:  PTA from home with wife.  CM will continue to follow for discharge needs   Expected Discharge Date:                  Expected Discharge Plan:  [place]  In-House Referral:     Discharge planning Services  CM Consult, Medication Assistance  Post Acute Care Choice:    Choice offered to:     DME Arranged:    DME Agency:     HH Arranged:    HH Agency:     Status of Service:  In process, will continue to follow  If discussed at Long Length of Stay Meetings, dates discussed:    Additional Comments:  11/05/15- 1200- Marvetta Gibbons RN CM- referral received for Xarelto needs- S/W LEY I.  @  [facility] [employer] # [phone]  XARELTO  20 MG DAILY ( 30 )  COVER- YES  CO-PAY- $ 40.00  TIER- 2 DRUG  PRIOR APPROVAL- YES # [phone]  PHARMACY : [facility] , [facility]  MAIL-ORDER FOR 90 DAY SUPPLY $ 80.00   Spoke with pt, wife and daughter at bedside regarding d/c plans- plan is to return home with wife- family has [facility] list but have not chosen a preferred agency at this time- pt may need home 02, and RW at discharge- CM will f/u on DME needs along with [facility] agency choice- wife also considering renting a hospital bed- info given to wife regarding this. Pt will need HH orders (RN/PT) with [facility] along with DME orders for discharge. 30 day free card for Xarelto given to wife- coverage info shared.    ---------- Maryclare Labrador, RN 11/02/2015, 11:30 AM - CM spoke with pt at bedside - alert and oriented.  Pt states he was completely independent prior to admit, wife will be with him for any recommended time period post discharge.  Pt is agreeable to recommended HHPT and equipment - asked that CM leave [facility] choice list so that he can  review it with wife - list given directly to pt.  CM will request HH/DME  orders via physician sticky note.  CM will continue to monitor for disposition needs   Dawayne Patricia, RN 11/05/2015, 12:16 PM [phone]

## 2015-11-05 NOTE — Progress Notes (Signed)
CARDIAC REHAB PHASE I   PRE:  Rate/Rhythm: 76 SR c/ PACs  BP:  Sitting: 109/68        SaO2: 100 3L  MODE:  Ambulation: 150 ft   POST:  Rate/Rhythm: 76 SR  BP:  Sitting: 128/62         SaO2: 97 3L  Pt ambulated 150 ft on 3L O2, rolling walker, assist x1, slow, steady gait, tolerated fair. Pt c/o moderate DOE, denies any other complaints, standing rest x1. Encouraged additional ambulation today (this was his first walk), IS. Pt to bed per pt request after walk, call bell within reach. Will follow.   [place], RN, BSN 11/05/2015 1:42 PM

## 2015-11-05 NOTE — Procedures (Signed)
Successful US guided right thoracentesis. Yielded 759mL of bloody pleural fluid. Pt tolerated procedure well. No immediate complications.  Specimen was not sent for labs. CXR ordered.  Ascencion Dike PA-C 11/05/2015 3:08 PM

## 2015-11-05 NOTE — Progress Notes (Signed)
11/05/2015 0955 EPW D/C'd per order and per protocol.  Ends intact. Pt. Tolerated well.  Advised bedrest x1hr.  Call bell in reach.  Vital signs collected per protocol. [place]

## 2015-11-05 NOTE — Progress Notes (Signed)
Patient Name: [name] Date of Encounter: 11/05/2015  Principal Problem:   Atypical angina ([place]) Active Problems:   Type II diabetes mellitus with neurological manifestations, uncontrolled ([place])   Hyperlipidemia   Essential hypertension   Aortic valve disease   Aortic aneurysm ([facility])   Abnormal nuclear stress test   Coronary artery disease involving native heart with angina pectoris ([place])   Aortic stenosis   Coronary artery disease involving native coronary artery of native heart without angina pectoris   Length of Stay: 9  SUBJECTIVE Postop day #7 Sternotomy related soreness, mild. Does continue to feel dizzy during episodes of afib, but denies any symptoms at rest.   CURRENT MEDS . amiodarone  200 mg Oral BID  . aspirin EC  81 mg Oral Daily  . atorvastatin  40 mg Oral Daily  . dextromethorphan-guaiFENesin  1 tablet Oral BID  . docusate sodium  200 mg Oral Daily  . ferrous gluconate  324 mg Oral BID WC  . folic acid  1 mg Oral Daily  . guaiFENesin  600 mg Oral BID  . insulin aspart  0-24 Units Subcutaneous TID AC & HS  . insulin detemir  8 Units Subcutaneous BID  . levalbuterol  0.63 mg Nebulization TID  . lisinopril  5 mg Oral Daily  . living well with diabetes book   Does not apply Once  . metoprolol tartrate  50 mg Oral BID  . pantoprazole  40 mg Oral QAC breakfast  . potassium chloride  20 mEq Oral Daily  . rivaroxaban  20 mg Oral Q supper  . sodium chloride flush  3 mL Intravenous Q12H    OBJECTIVE   Intake/Output Summary (Last 24 hours) at 11/05/15 1205 Last data filed at 11/05/15 1127  Gross per 24 hour  Intake              483 ml  Output             3001 ml  Net            -2518 ml   Filed Weights   11/03/15 0454 11/04/15 0447 11/05/15 0453  Weight: 210 lb 4.8 oz (95.4 kg) 207 lb 4.8 oz (94 kg) 202 lb 12.8 oz (92 kg)    PHYSICAL EXAM Vitals:   11/05/15 0955 11/05/15 1030 11/05/15 1045 11/05/15 1100  BP: 120/67 125/74 136/70 126/72  Pulse:  77 72 78 76  Resp:      Temp:      TempSrc:      SpO2:      Weight:      Height:       General: Alert, oriented x3, no distress Head: no evidence of trauma, PERRL, EOMI, no exophtalmos or lid lag, no myxedema, no xanthelasma; normal ears, nose and oropharynx Neck: normal jugular venous pulsations and no hepatojugular reflux; brisk carotid pulses without delay and no carotid bruits Chest: sternotomy healing, clear to auscultation, no signs of consolidation by percussion or palpation, normal fremitus, symmetrical and full respiratory excursions Cardiovascular: normal position and quality of the apical impulse, regular rhythm, normal first and second heart sounds, no rubs or gallops, 2/6 aortic ejection murmur, no diastolic murmur Abdomen: no tenderness or distention, no masses by palpation, no abnormal pulsatility or arterial bruits, normal bowel sounds, no hepatosplenomegaly Extremities: no clubbing, cyanosis or edema; 2+ radial, ulnar and brachial pulses bilaterally; 2+ right femoral, posterior tibial and dorsalis pedis pulses; 2+ left femoral, posterior tibial and dorsalis pedis pulses; no  subclavian or femoral bruits Neurological: grossly nonfocal  LABS  CBC  Recent Labs  11/04/15 0311 11/05/15 0834  WBC 11.6* 13.5*  HGB 9.4* 10.4*  HCT 28.0* 32.2*  MCV 88.3 89.9  PLT 261 [id number]   Basic Metabolic Panel  Recent Labs  11/04/15 0311 11/05/15 0834  NA 137 138  K 4.5 4.2  CL 103 102  CO2 25 28  GLUCOSE 143* 119*  BUN 11 10  CREATININE 0.83 0.90  CALCIUM 8.1* 8.7*   Radiology Studies Imaging results have been reviewed and Dg Chest 2 View  Result Date: 11/04/2015 CLINICAL DATA:  Hypoxia, dizziness EXAM: CHEST  2 VIEW COMPARISON:  11/02/2015 FINDINGS: Status post median sternotomy and cardiac valve replacement. Cardiomediastinal silhouette is stable. Stable title again noted some right perihilar scarring. Bilateral small pleural effusion with bilateral basilar atelectasis or  infiltrate. No pulmonary edema. IMPRESSION: No pulmonary edema. Bilateral small pleural effusion with bilateral basilar atelectasis or infiltrate. Electronically Signed   By: Lahoma Crocker M.D.   On: 11/04/2015 12:51    TELE NSR with occasional bursts of atrial fibrillation with RVR to 150s, sustained, but mostly <1 minute in duration, PVCs, and PACs  ECG SR  ASSESSMENT AND PLAN  1. Postop atrial fibrillation: with symptomatic RVR, continues to have bursts of a-fib RVR on telemetry. Agree with amiodarone, but expect it will take a few days for PO amio to have any impact. Increased beta blocker yesterday, but may need further titration as episodes of a-fib RVR continue.  -- Temp wires were removed this morning, plan to start Xarelto for about 30 days later this afternoon. CHADSVasc 5 (age, CAD, HTN, DM, LV systolic dysfunction).  -- Hgb continues to improve 2. S/P AVR &asc aorta repair: bioprosthetic AV. Don't think this qualifies as "valvular" atrial fibrillation. 3. CAD s/p CABG x 4: would not give ASA while on anticoagulation, but resume it if we stop the anticoagulant. 4. Desaturation with exercise: Chest x-ray showed bilateral small pleural effusions, plan for thoracentesis today. Xarelto to be started this afternoon.   Reino Bellis, NP-C 12:05 PM  I have seen and examined the patient along with Reino Bellis, NP-C.  I have reviewed the chart, notes and new data.  I agree with NP's note.  Key new complaints: dizzy during brief episodes of atrial fibrillation with RVR Key examination changes: dullness to percussion both lung bases, R>L Key new findings / data: CXR reviewed  PLAN: For R thoracentesis today. Would recommend starting anticoagulation after that is performed. Metoprolol dose increased.. Dose will probably need to be reduced as amiodarone "kicks in".  Sanda Klein, MD, [place] [phone] 11/05/2015, 1:39 PM

## 2015-11-06 ENCOUNTER — Inpatient Hospital Stay (HOSPITAL_COMMUNITY): Payer: Medicare Other

## 2015-11-06 ENCOUNTER — Encounter (HOSPITAL_COMMUNITY): Payer: Self-pay | Admitting: Radiology

## 2015-11-06 LAB — CBC
HCT: 31 % — ABNORMAL LOW (ref 39.0–52.0)
Hemoglobin: 9.8 g/dL — ABNORMAL LOW (ref 13.0–17.0)
MCH: 28.8 pg (ref 26.0–34.0)
MCHC: 31.6 g/dL (ref 30.0–36.0)
MCV: 91.2 fL (ref 78.0–100.0)
Platelets: 381 10*3/uL (ref 150–400)
RBC: 3.4 MIL/uL — ABNORMAL LOW (ref 4.22–5.81)
RDW: 15.8 % — ABNORMAL HIGH (ref 11.5–15.5)
WBC: 13.3 10*3/uL — ABNORMAL HIGH (ref 4.0–10.5)

## 2015-11-06 LAB — BASIC METABOLIC PANEL
Anion gap: 11 (ref 5–15)
BUN: 13 mg/dL (ref 6–20)
CO2: 29 mmol/L (ref 22–32)
Calcium: 8.6 mg/dL — ABNORMAL LOW (ref 8.9–10.3)
Chloride: 99 mmol/L — ABNORMAL LOW (ref 101–111)
Creatinine, Ser: 1 mg/dL (ref 0.61–1.24)
GFR calc Af Amer: >60 mL/min (ref >60)
GFR calc non Af Amer: >60 mL/min (ref >60)
Glucose, Bld: 104 mg/dL — ABNORMAL HIGH (ref 65–99)
Potassium: 4.1 mmol/L (ref 3.5–5.1)
Sodium: 139 mmol/L (ref 135–145)

## 2015-11-06 LAB — GLUCOSE, CAPILLARY
GLUCOSE-CAPILLARY: 122 mg/dL — AB (ref 65–99)
GLUCOSE-CAPILLARY: 152 mg/dL — AB (ref 65–99)
Glucose-Capillary: 170 mg/dL — ABNORMAL HIGH (ref 65–99)
Glucose-Capillary: 126 mg/dL — ABNORMAL HIGH (ref 65–99)

## 2015-11-06 MED ORDER — FUROSEMIDE 40 MG PO TABS
40.0000 mg | ORAL_TABLET | Freq: Every day | ORAL | Status: DC
  Administered 2015-11-06 – 2015-11-08 (×3): 40 mg via ORAL
  Filled 2015-11-06 (×3): qty 1

## 2015-11-06 MED ORDER — POTASSIUM CHLORIDE CRYS ER 20 MEQ PO TBCR: 20.0000 meq | EXTENDED_RELEASE_TABLET | Freq: Every day | ORAL | Status: DC

## 2015-11-06 NOTE — Progress Notes (Signed)
CARDIAC REHAB PHASE I   PRE:  Rate/Rhythm: 75 SR  BP:  Sitting: 118/48        SaO2: 94 RA  MODE:  Ambulation: 350 ft   POST:  Rate/Rhythm: 98 SR  BP:  Sitting: 120/55         SaO2: 90 RA  Pt ambulated 350 ft on RA, rolling walker, assist x1, slow, steady gait, tolerated well, no complaints. Encouraged deep breathing, cough. Cardiac surgery discharge education completed with pt and wife at bedside. Reviewed IS, sternal precautions, activity progression, exercise guidelines, heart healthy and diabetes diet, daily weights, and phase 2 cardiac rehab. Pt verbalized understanding. Pt agrees to phase 2 cardiac rehab referral, will [facility] to [facility] per pt request. Pt to recliner after walk, call bell within reach.  HZ:[phone] Lenna Sciara, RN, BSN 11/06/2015 2:52 PM

## 2015-11-06 NOTE — Progress Notes (Addendum)
      [address]       [place] [address]             [phone]      8 Days Post-Op Procedure(s) (LRB): CORONARY ARTERY BYPASS GRAFTING (CABG) x Four UTILIZING THE LEFT INTERNAL MAMMARY ARTERY AND ENDOSCOPICALLY HARVESTED RIGHT SAPEHENEOUS VEINS. (N/A) AORTIC VALVE REPLACEMENT (AVR) WITH 23MM MAGNA EASE TISSUE VALVE. (N/A) REPLACEMENT OF ASCENDING AORTA USING 34MM X 30CM WOVEN DOUBLE VELOUR VASCULAR GRAFT. (N/A) TRANSESOPHAGEAL ECHOCARDIOGRAM (TEE) (N/A)   Subjective:  Feeling better after Thoracentesis yesterday.  States he has been able to cough more effectively.  + ambulation  + BM  Objective: Vital signs in last 24 hours: Temp:  [98.6 F (37 C)-99.1 F (37.3 C)] 98.6 F (37 C) (09/23 0500) Pulse Rate:  [67-82] 67 (09/23 0500) Cardiac Rhythm: Normal sinus rhythm (09/22 2000) Resp:  [18] 18 (09/23 0500) BP: (106-138)/(53-78) 106/53 (09/23 0500) SpO2:  [97 %-100 %] 97 % (09/23 0500) Weight:  [197 lb 1.6 oz (89.4 kg)] 197 lb 1.6 oz (89.4 kg) (09/23 0500)  Intake/Output from previous day: 09/22 0701 - 09/23 0700 In: 483 [P.O.:480; I.V.:3] Out: 2250 [Urine:2250]  General appearance: alert, cooperative and no distress Heart: regular rate and rhythm Lungs: diminished breath sounds bibasilar and , but improved after thoracentesis Abdomen: soft, non-tender; bowel sounds normal; no masses,  no organomegaly Extremities: edema trace Wound: clean and dry  Lab Results:  Recent Labs  11/05/15 0834 11/06/15 0247  WBC 13.5* 13.3*  HGB 10.4* 9.8*  HCT 32.2* 31.0*  PLT 345 381   BMET:  Recent Labs  11/05/15 0834 11/06/15 0247  NA 138 139  K 4.2 4.1  CL 102 99*  CO2 28 29  GLUCOSE 119* 104*  BUN 10 13  CREATININE 0.90 1.00  CALCIUM 8.7* 8.6*    PT/INR: No results for input(s): LABPROT, INR in the last 72 hours. ABG    Component Value Date/Time   PHART 7.343 (L) 10/30/2015 0046   HCO3 23.0 10/30/2015 0224   TCO2 25 10/31/2015 1622   ACIDBASEDEF 2.0 10/30/2015 0224   O2SAT 98.0 10/30/2015 0224   CBG (last 3)   Recent Labs  11/05/15 1617 11/05/15 2125 11/06/15 0614  GLUCAP 115* 210* 122*    Assessment/Plan: S/P Procedure(s) (LRB): CORONARY ARTERY BYPASS GRAFTING (CABG) x Four UTILIZING THE LEFT INTERNAL MAMMARY ARTERY AND ENDOSCOPICALLY HARVESTED RIGHT SAPEHENEOUS VEINS. (N/A) AORTIC VALVE REPLACEMENT (AVR) WITH 23MM MAGNA EASE TISSUE VALVE. (N/A) REPLACEMENT OF ASCENDING AORTA USING 34MM X 30CM WOVEN DOUBLE VELOUR VASCULAR GRAFT. (N/A) TRANSESOPHAGEAL ECHOCARDIOGRAM (TEE) (N/A)  1. CV- PAF, maintaining NSR- continue Amiodarone, Lopressor, Lisinopril- will start Xarelto per pharmacy today 2. Pulm- wean oxygen as tolerated, 700 cc removed from Right chest yesterday with Thoracentesis, F/U CXR ordered for 6 Am, however not yet completed 3. Renal- creatinine at 1.00, weight improving, minimal LE edema... Will give short course of oral Lasix 4. DM- sugars controlled 5. Dispo- patient stable, Xarelto per pharmacy.. Wean oxygen today as tolerated, possibly ready for d/c in AM   LOS: 10 days    [name] 11/06/2015   Chart reviewed, patient examined, agree with above. CXR looks much better after thoracentesis. Xarelto restarted.

## 2015-11-07 LAB — GLUCOSE, CAPILLARY
GLUCOSE-CAPILLARY: 112 mg/dL — AB (ref 65–99)
GLUCOSE-CAPILLARY: 127 mg/dL — AB (ref 65–99)
GLUCOSE-CAPILLARY: 187 mg/dL — AB (ref 65–99)
Glucose-Capillary: 163 mg/dL — ABNORMAL HIGH (ref 65–99)

## 2015-11-07 MED ORDER — LOSARTAN POTASSIUM 25 MG PO TABS
25.0000 mg | ORAL_TABLET | Freq: Every day | ORAL | Status: DC
  Administered 2015-11-08: 25 mg via ORAL
  Filled 2015-11-07: qty 1

## 2015-11-07 MED ORDER — AMIODARONE IV BOLUS ONLY 150 MG/100ML
150.0000 mg | Freq: Once | INTRAVENOUS | Status: AC
  Administered 2015-11-07: 150 mg via INTRAVENOUS
  Filled 2015-11-07: qty 100

## 2015-11-07 NOTE — Progress Notes (Signed)
Pt remains in afib/aflutter hr 140's. bp 95/63. Called Dr. Cyndia Bent made him aware that a dose of prn lopressor 5mg  was given, but had not been effective. Orders to give a 150mg  Amiodarone bolus, and to repeat within 1hr with no response.

## 2015-11-07 NOTE — Progress Notes (Addendum)
      [address]       [place] [address]             [phone]      9 Days Post-Op Procedure(s) (LRB): CORONARY ARTERY BYPASS GRAFTING (CABG) x Four UTILIZING THE LEFT INTERNAL MAMMARY ARTERY AND ENDOSCOPICALLY HARVESTED RIGHT SAPEHENEOUS VEINS. (N/A) AORTIC VALVE REPLACEMENT (AVR) WITH 23MM MAGNA EASE TISSUE VALVE. (N/A) REPLACEMENT OF ASCENDING AORTA USING 34MM X 30CM WOVEN DOUBLE VELOUR VASCULAR GRAFT. (N/A) TRANSESOPHAGEAL ECHOCARDIOGRAM (TEE) (N/A)   Subjective:  No complaints.  Had rapid A. Fib overnight, asymptomatic  Objective: Vital signs in last 24 hours: Temp:  [98.8 F (37.1 C)-99.1 F (37.3 C)] 99.1 F (37.3 C) (09/24 0630) Pulse Rate:  [67-142] 67 (09/24 0130) Cardiac Rhythm: Normal sinus rhythm (09/24 0130) Resp:  [18-25] 18 (09/24 0630) BP: (93-118)/(49-65) 114/52 (09/24 0630) SpO2:  [83 %-99 %] 97 % (09/24 0630) Weight:  [197 lb 1.5 oz (89.4 kg)] 197 lb 1.5 oz (89.4 kg) (09/24 0630)  Intake/Output from previous day: 09/23 0701 - 09/24 0700 In: 240 [P.O.:240] Out: 440 [Urine:440]  General appearance: alert, cooperative and no distress Heart: regular rate and rhythm Lungs: clear to auscultation bilaterally Abdomen: soft, non-tender; bowel sounds normal; no masses,  no organomegaly Extremities: edema trace Wound: clean and dry  Lab Results:  Recent Labs  11/05/15 0834 11/06/15 0247  WBC 13.5* 13.3*  HGB 10.4* 9.8*  HCT 32.2* 31.0*  PLT 345 381   BMET:  Recent Labs  11/05/15 0834 11/06/15 0247  NA 138 139  K 4.2 4.1  CL 102 99*  CO2 28 29  GLUCOSE 119* 104*  BUN 10 13  CREATININE 0.90 1.00  CALCIUM 8.7* 8.6*    PT/INR: No results for input(s): LABPROT, INR in the last 72 hours. ABG    Component Value Date/Time   PHART 7.343 (L) 10/30/2015 0046   HCO3 23.0 10/30/2015 0224   TCO2 25 10/31/2015 1622   ACIDBASEDEF 2.0 10/30/2015 0224   O2SAT 98.0 10/30/2015 0224   CBG (last 3)   Recent Labs  11/06/15 1555  11/06/15 2110 11/07/15 0614  GLUCAP 152* 126* 112*    Assessment/Plan: S/P Procedure(s) (LRB): CORONARY ARTERY BYPASS GRAFTING (CABG) x Four UTILIZING THE LEFT INTERNAL MAMMARY ARTERY AND ENDOSCOPICALLY HARVESTED RIGHT SAPEHENEOUS VEINS. (N/A) AORTIC VALVE REPLACEMENT (AVR) WITH 23MM MAGNA EASE TISSUE VALVE. (N/A) REPLACEMENT OF ASCENDING AORTA USING 34MM X 30CM WOVEN DOUBLE VELOUR VASCULAR GRAFT. (N/A) TRANSESOPHAGEAL ECHOCARDIOGRAM (TEE) (N/A)  1. CV- Rapid A. Fib again overnight, currently NSR- treated with Amiodarone bolus- will continue oral Amiodarone, Lopressor, Lisinopril, Xarelto 2. Pulm- oxygen down to 1 L, continue to wean as tolerated, continue IS 3. Renal- weight is below baseline, minimal LE edema- will give one last dose of oral Lasix today 4. DM- sugars remain controlled 5. Dispo-patient again with A. Fib overnight treated with Amiodarone bolus, currently NSR- patient not ready for discharge today, will monitor and will plan to discharge once HR remains stable   LOS: 11 days    [name], ERIN 11/07/2015   Chart reviewed, patient examined, agree with above. He was on Losartan 100 mg  preop so will start that at 25 mg instead of using Lisinopril.

## 2015-11-07 NOTE — Progress Notes (Signed)
Pt converted to afib hr >140's sbp 100's. Gave pt lopressor 5mg  iv per prn order, along with his scheduled amio/metoprolol. Monitoring will continue.

## 2015-11-08 LAB — GLUCOSE, CAPILLARY
GLUCOSE-CAPILLARY: 124 mg/dL — AB (ref 65–99)
Glucose-Capillary: 187 mg/dL — ABNORMAL HIGH (ref 65–99)

## 2015-11-08 MED ORDER — POTASSIUM CHLORIDE CRYS ER 20 MEQ PO TBCR: 20.0000 meq | EXTENDED_RELEASE_TABLET | Freq: Every day | ORAL | 0 refills | Status: DC

## 2015-11-08 MED ORDER — AMIODARONE HCL 200 MG PO TABS: 200.0000 mg | ORAL_TABLET | Freq: Two times a day (BID) | ORAL | 1 refills | Status: DC

## 2015-11-08 MED ORDER — FUROSEMIDE 40 MG PO TABS: 40.0000 mg | ORAL_TABLET | Freq: Every day | ORAL | 0 refills | Status: DC

## 2015-11-08 MED ORDER — RIVAROXABAN 20 MG PO TABS
20.0000 mg | ORAL_TABLET | Freq: Every day | ORAL | 0 refills | Status: DC
Start: 2015-11-08 — End: 2016-01-20

## 2015-11-08 MED ORDER — OXYCODONE HCL 5 MG PO TABS: 5.0000 mg | ORAL_TABLET | Freq: Four times a day (QID) | ORAL | 0 refills | Status: DC | PRN

## 2015-11-08 MED ORDER — RIVAROXABAN 20 MG PO TABS: 20.0000 mg | ORAL_TABLET | Freq: Every day | ORAL | 1 refills | Status: DC

## 2015-11-08 MED ORDER — FERROUS GLUCONATE 324 (38 FE) MG PO TABS: 324.0000 mg | ORAL_TABLET | Freq: Two times a day (BID) | ORAL | 1 refills | Status: DC

## 2015-11-08 MED ORDER — FOLIC ACID 1 MG PO TABS: 1.0000 mg | ORAL_TABLET | Freq: Every day | ORAL | 1 refills | Status: DC

## 2015-11-08 MED ORDER — METOPROLOL TARTRATE 75 MG PO TABS: 75.0000 mg | ORAL_TABLET | Freq: Two times a day (BID) | ORAL | 1 refills | Status: DC

## 2015-11-08 MED ORDER — LOSARTAN POTASSIUM 25 MG PO TABS: 25.0000 mg | ORAL_TABLET | Freq: Every day | ORAL | 1 refills | Status: DC

## 2015-11-08 NOTE — Progress Notes (Signed)
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    PT Comments    Pt very pleasant, moving well with increased endurance and oxygen saturation on RA today. Pt with HR 65-81 with gait, sats 90-92% with ambulation with brief drop to 88% after stairs but able to recover with cues for breathing technique. Pt able to state all precautions end of session and demonstrated 750cc on IS with encouragement to continue use. Pt safe for return home.   Follow Up Recommendations  Home health PT;Supervision/Assistance - 24 hour     Equipment Recommendations  Rolling walker with 5" wheels;3in1 (PT)    Recommendations for Other Services       Precautions / Restrictions Precautions Precautions: Sternal;Fall Restrictions Weight Bearing Restrictions:  (sternal precautions)    Mobility  Bed Mobility               General bed mobility comments: in chair on arrival  Transfers Overall transfer level: Modified independent                  Ambulation/Gait Ambulation/Gait assistance: Modified independent (Device/Increase time) Ambulation Distance (Feet): 350 Feet Assistive device: Rolling walker (2 wheeled) Gait Pattern/deviations: Step-through pattern;Decreased stride length   Gait velocity interpretation: Below normal speed for age/gender General Gait Details: pt walked last 50' without RW but states he still feels more confidant with RW at this time. slow gait   Stairs Stairs: Yes Stairs assistance: Modified independent (Device/Increase time) Stair Management: Forwards;Alternating pattern;One rail  Left Number of Stairs: 10 General stair comments: cues for standing rest at end of stair ambulation, dropped to 88% after stairs but able to recover with standing rest after only 15 sec  Wheelchair Mobility    Modified Rankin (Stroke Patients Only)       Balance                                    Cognition Arousal/Alertness: Awake/alert Behavior During Therapy: WFL for tasks assessed/performed Overall Cognitive Status: Within Functional Limits for tasks assessed                      Exercises      General Comments        Pertinent Vitals/Pain Pain Assessment: No/denies pain    Home Living                      Prior Function            PT Goals (current goals can now be found in the care plan section) Progress towards PT goals: Progressing toward goals    Frequency           PT Plan Current plan remains appropriate    Co-evaluation             End of Session   Activity Tolerance: Patient tolerated treatment well Patient left: in chair;with call bell/phone within reach;with family/visitor present     Time: 1105-1120 PT Time Calculation (min) (ACUTE ONLY): 15 min  Charges:  $Gait Training: 8-22 mins  G CodesMelford Aase 11-14-2015, 11:25 AM Elwyn Reach, [place]

## 2015-11-08 NOTE — Progress Notes (Signed)
[address]       [place] [address]             [phone]      10 Days Post-Op Procedure(s) (LRB): CORONARY ARTERY BYPASS GRAFTING (CABG) x Four UTILIZING THE LEFT INTERNAL MAMMARY ARTERY AND ENDOSCOPICALLY HARVESTED RIGHT SAPEHENEOUS VEINS. (N/A) AORTIC VALVE REPLACEMENT (AVR) WITH 23MM MAGNA EASE TISSUE VALVE. (N/A) REPLACEMENT OF ASCENDING AORTA USING 34MM X 30CM WOVEN DOUBLE VELOUR VASCULAR GRAFT. (N/A) TRANSESOPHAGEAL ECHOCARDIOGRAM (TEE) (N/A) Subjective: Feels well  Objective: Vital signs in last 24 hours: Temp:  [98.9 F (37.2 C)-99.5 F (37.5 C)] 99 F (37.2 C) (09/25 0435) Pulse Rate:  [67-75] 73 (09/25 0435) Cardiac Rhythm: Normal sinus rhythm (09/24 2030) Resp:  [18-20] 20 (09/25 0435) BP: (111-117)/(49-53) 113/52 (09/25 0435) SpO2:  [95 %-97 %] 95 % (09/25 0435)  Hemodynamic parameters for last 24 hours:    Intake/Output from previous day: 09/24 0701 - 09/25 0700 In: 100 [P.O.:100] Out: 376 [Urine:375; Stool:1] Intake/Output this shift: No intake/output data recorded.  General appearance: alert, cooperative and no distress Heart: regular rate and rhythm Lungs: dim in bases Abdomen: soft, benign Extremities: no edema Wound: incis healing well  Lab Results:  Recent Labs  11/05/15 0834 11/06/15 0247  WBC 13.5* 13.3*  HGB 10.4* 9.8*  HCT 32.2* 31.0*  PLT 345 381   BMET:  Recent Labs  11/05/15 0834 11/06/15 0247  NA 138 139  K 4.2 4.1  CL 102 99*  CO2 28 29  GLUCOSE 119* 104*  BUN 10 13  CREATININE 0.90 1.00  CALCIUM 8.7* 8.6*    PT/INR: No results for input(s): LABPROT, INR in the last 72 hours. ABG    Component Value Date/Time   PHART 7.343 (L) 10/30/2015 0046   HCO3 23.0 10/30/2015 0224   TCO2 25 10/31/2015 1622   ACIDBASEDEF 2.0 10/30/2015 0224   O2SAT 98.0 10/30/2015 0224   CBG (last 3)   Recent Labs  11/07/15 1628 11/07/15 2139 11/08/15 0703  GLUCAP 187* 163* 124*    Meds Scheduled  Meds: . amiodarone  200 mg Oral BID  . atorvastatin  40 mg Oral Daily  . dextromethorphan-guaiFENesin  1 tablet Oral BID  . docusate sodium  200 mg Oral Daily  . ferrous gluconate  324 mg Oral BID WC  . folic acid  1 mg Oral Daily  . furosemide  40 mg Oral Daily  . guaiFENesin  600 mg Oral BID  . insulin aspart  0-24 Units Subcutaneous TID AC & HS  . insulin detemir  8 Units Subcutaneous BID  . living well with diabetes book   Does not apply Once  . losartan  25 mg Oral Daily  . metoprolol tartrate  75 mg Oral BID  . pantoprazole  40 mg Oral QAC breakfast  . potassium chloride  20 mEq Oral Daily  . rivaroxaban  20 mg Oral Q supper  . sodium chloride flush  3 mL Intravenous Q12H   Continuous Infusions:  PRN Meds:.sodium chloride, bisacodyl **OR** bisacodyl, lactulose, levalbuterol, metoprolol, ondansetron **OR** ondansetron (ZOFRAN) IV, oxyCODONE, sodium chloride flush, traMADol  Xrays Dg Chest 2 View  Result Date: 11/06/2015 CLINICAL DATA:  Pt states he had 2 valves replacements and an aortic aneurysm repair last week. Pt has no other symptoms or complaints and no other heart problems. Pt has diabetes. Pleural effusion, right EXAM: CHEST  2 VIEW COMPARISON:  11/05/2015 FINDINGS: Changes from cardiac surgery and valve replacement  are stable. Cardiac silhouette is normal in size. No mediastinal or hilar masses. There is left greater than right lung base opacity. On the left, this is likely due to a combination of a small pleural effusion with atelectasis. The right lung base opacity is likely atelectasis. Is no overt pulmonary edema. No pneumothorax. IMPRESSION: 1. Bibasilar lung opacity is similar to the most recent prior exam. This is consistent with a combination a left pleural effusion and bilateral lung base atelectasis. 2. No overt pulmonary edema.  No pneumothorax. Electronically Signed   By: Lajean Manes M.D.   On: 11/06/2015 13:24    Assessment/Plan: S/P Procedure(s)  (LRB): CORONARY ARTERY BYPASS GRAFTING (CABG) x Four UTILIZING THE LEFT INTERNAL MAMMARY ARTERY AND ENDOSCOPICALLY HARVESTED RIGHT SAPEHENEOUS VEINS. (N/A) AORTIC VALVE REPLACEMENT (AVR) WITH 23MM MAGNA EASE TISSUE VALVE. (N/A) REPLACEMENT OF ASCENDING AORTA USING 34MM X 30CM WOVEN DOUBLE VELOUR VASCULAR GRAFT. (N/A) TRANSESOPHAGEAL ECHOCARDIOGRAM (TEE) (N/A)  1 doing well 2 stable for d/c     LOS: 12 days    GOLD,WAYNE E 11/08/2015

## 2015-11-08 NOTE — Progress Notes (Signed)
Discussed with the patient, wife, daughter -and all questioned fully answered. Given paper prescriptions and discharge paperwork. Pt already had 30 day card for Xarelto from case management. Pt Kaleisha Bhargava delivered to room.  Telemetry removed. IV removed.   Fritz Pickerel, RN

## 2015-11-08 NOTE — Care Management Important Message (Signed)
Important Message  Patient Details  Name: [name] MRN: [id number] Date of Birth: 1945/05/24   Medicare Important Message Given:  Yes    Nathen May 11/08/2015, 10:58 AM

## 2015-11-08 NOTE — Care Management Note (Signed)
Case Management Note Previous CM note initiated by Maryclare Labrador, RN 11/02/2015, 11:30 AM   Patient Details  Name: [name] MRN: [id number] Date of Birth: 1945-10-15  Subjective/Objective:    3 days s/p CABG and AVR               Action/Plan:  PTA from home with wife.  CM will continue to follow for discharge needs   Expected Discharge Date:    11/08/15              Expected Discharge Plan:  [place]  In-House Referral:     Discharge planning Services  CM Consult, Medication Assistance  Post Acute Care Choice:  Home Health, Durable Medical Equipment Choice offered to:  Patient, Spouse  DME Arranged:  [place] rolling DME Agency:  [place] Arranged:  PT [place]:  [place]  Status of Service:  Completed, signed off  If discussed at [place] of Stay Meetings, dates discussed:    Discharge Disposition: Home with Home Health   Additional Comments:  11/08/15- 1120- Yusef Lamp RN, CM- pt for d/c home today, has decided he will need RW- order placed along with HHPT- spoke with pt and wife at bedside to see what [facility] agency they have decided on- per wife she would like to use [facility] for services- she would also like list of Private duty agencies- referral called to [place] with Alvis Lemmings for [facility] PT- referral accepted- call also placed to [facility] with [facility] for RW need- DME to be delivered room prior to discharge- list of private duty agencies provided to wife per request-   11/05/15- 1200- Marvetta Gibbons RN CM- referral received for Xarelto needs- S/W LEY I.  @  [facility] # [phone]  XARELTO  20 MG DAILY ( 30 )  COVER- YES  CO-PAY- $ 40.00  TIER- 2 DRUG  PRIOR APPROVAL- YES # [phone]  PHARMACY : [facility] , [facility]  MAIL-ORDER FOR 90 DAY SUPPLY $ 80.00   Spoke with pt, wife and daughter at bedside regarding d/c plans- plan is to return home with wife- family has [facility] list but have not chosen a  preferred agency at this time- pt may need home 02, and RW at discharge- CM will f/u on DME needs along with [facility] agency choice- wife also considering renting a hospital bed- info given to wife regarding this. Pt will need HH orders (RN/PT) with F2F along with DME orders for discharge. 30 day free card for Xarelto given to wife- coverage info shared.    ---------- Maryclare Labrador, RN 11/02/2015, 11:30 AM - CM spoke with pt at bedside - alert and oriented.  Pt states he was completely independent prior to admit, wife will be with him for any recommended time period post discharge.  Pt is agreeable to recommended HHPT and equipment - asked that CM leave [facility] choice list so that he can review it with wife - list given directly to pt.  CM will request HH/DME  orders via physician sticky note.  CM will continue to monitor for disposition needs   Dawayne Patricia, RN 11/08/2015, 11:25 AM [phone]

## 2015-11-08 NOTE — Progress Notes (Signed)
Pt for d/c. RN sts he has walked without O2 over the weekend and no O2 at night. SaO2 95 RA. Gave pt reminders on walking daily and watching diet. Set up carb counting video.  [place] CES, ACSM 9:08 AM 11/08/2015

## 2015-11-09 ENCOUNTER — Other Ambulatory Visit: Payer: Self-pay

## 2015-11-09 DIAGNOSIS — I25810 Atherosclerosis of coronary artery bypass graft(s) without angina pectoris: Secondary | ICD-10-CM | POA: Diagnosis not present

## 2015-11-09 DIAGNOSIS — Z48812 Encounter for surgical aftercare following surgery on the circulatory system: Secondary | ICD-10-CM | POA: Diagnosis not present

## 2015-11-11 ENCOUNTER — Telehealth: Payer: Self-pay | Admitting: *Deleted

## 2015-11-11 ENCOUNTER — Other Ambulatory Visit: Payer: Self-pay | Admitting: *Deleted

## 2015-11-11 DIAGNOSIS — R197 Diarrhea, unspecified: Secondary | ICD-10-CM

## 2015-11-11 DIAGNOSIS — R112 Nausea with vomiting, unspecified: Secondary | ICD-10-CM

## 2015-11-11 DIAGNOSIS — Z9889 Other specified postprocedural states: Secondary | ICD-10-CM

## 2015-11-11 MED ORDER — ONDANSETRON 4 MG PO TBDP: 4.0000 mg | ORAL_TABLET | Freq: Three times a day (TID) | ORAL | 0 refills | Status: DC | PRN

## 2015-11-11 NOTE — Telephone Encounter (Signed)
Evelena Peat from [facility] called after making his first visit and assessment at the home of [name]. He says that [name] has been having n/v for the last few days, tolerating very few liquids or foods and having persistent watery stools. He says this started in the hospital. I discussed this with Dr. Servando Snare here in the office. Orders were to obtain a specimen to be obtained to r/o C-Diff,  Zofran for n/v and stop the iron for the present time. This has been relayed to the [facility] and/or [name].He will be unable to go to the lab until tomorrow due to the lateness of the day. I will call tomorrow to check on his status.

## 2015-11-12 DIAGNOSIS — Z736 Limitation of activities due to disability: Secondary | ICD-10-CM

## 2015-11-13 ENCOUNTER — Other Ambulatory Visit: Payer: Self-pay

## 2015-11-13 ENCOUNTER — Emergency Department (HOSPITAL_COMMUNITY): Payer: Medicare Other

## 2015-11-13 ENCOUNTER — Encounter (HOSPITAL_COMMUNITY): Payer: Self-pay

## 2015-11-13 ENCOUNTER — Inpatient Hospital Stay (HOSPITAL_COMMUNITY)
Admission: EM | Admit: 2015-11-13 | Discharge: 2015-11-20 | DRG: 312 | Disposition: A | Discharge status: Home Health | Payer: Medicare Other | Attending: Internal Medicine | Admitting: Internal Medicine

## 2015-11-13 DIAGNOSIS — E1140 Type 2 diabetes mellitus with diabetic neuropathy, unspecified: Secondary | ICD-10-CM

## 2015-11-13 DIAGNOSIS — R55 Syncope and collapse: Secondary | ICD-10-CM | POA: Diagnosis present

## 2015-11-13 DIAGNOSIS — Z808 Family history of malignant neoplasm of other organs or systems: Secondary | ICD-10-CM

## 2015-11-13 DIAGNOSIS — I10 Essential (primary) hypertension: Secondary | ICD-10-CM | POA: Diagnosis present

## 2015-11-13 DIAGNOSIS — I951 Orthostatic hypotension: Principal | ICD-10-CM | POA: Diagnosis present

## 2015-11-13 DIAGNOSIS — Z953 Presence of xenogenic heart valve: Secondary | ICD-10-CM

## 2015-11-13 DIAGNOSIS — IMO0002 Reserved for concepts with insufficient information to code with codable children: Secondary | ICD-10-CM

## 2015-11-13 DIAGNOSIS — R1110 Vomiting, unspecified: Secondary | ICD-10-CM

## 2015-11-13 DIAGNOSIS — R310 Gross hematuria: Secondary | ICD-10-CM | POA: Diagnosis present

## 2015-11-13 DIAGNOSIS — I350 Nonrheumatic aortic (valve) stenosis: Secondary | ICD-10-CM

## 2015-11-13 DIAGNOSIS — I248 Other forms of acute ischemic heart disease: Secondary | ICD-10-CM | POA: Diagnosis present

## 2015-11-13 DIAGNOSIS — N3001 Acute cystitis with hematuria: Secondary | ICD-10-CM | POA: Diagnosis present

## 2015-11-13 DIAGNOSIS — E860 Dehydration: Secondary | ICD-10-CM | POA: Diagnosis present

## 2015-11-13 DIAGNOSIS — R112 Nausea with vomiting, unspecified: Secondary | ICD-10-CM | POA: Diagnosis present

## 2015-11-13 DIAGNOSIS — B961 Klebsiella pneumoniae [K. pneumoniae] as the cause of diseases classified elsewhere: Secondary | ICD-10-CM | POA: Diagnosis present

## 2015-11-13 DIAGNOSIS — Z79899 Other long term (current) drug therapy: Secondary | ICD-10-CM

## 2015-11-13 DIAGNOSIS — Z833 Family history of diabetes mellitus: Secondary | ICD-10-CM

## 2015-11-13 DIAGNOSIS — R197 Diarrhea, unspecified: Secondary | ICD-10-CM

## 2015-11-13 DIAGNOSIS — Z951 Presence of aortocoronary bypass graft: Secondary | ICD-10-CM

## 2015-11-13 DIAGNOSIS — D62 Acute posthemorrhagic anemia: Secondary | ICD-10-CM | POA: Diagnosis present

## 2015-11-13 DIAGNOSIS — R7989 Other specified abnormal findings of blood chemistry: Secondary | ICD-10-CM

## 2015-11-13 DIAGNOSIS — I480 Paroxysmal atrial fibrillation: Secondary | ICD-10-CM | POA: Diagnosis present

## 2015-11-13 DIAGNOSIS — R778 Other specified abnormalities of plasma proteins: Secondary | ICD-10-CM | POA: Diagnosis present

## 2015-11-13 DIAGNOSIS — N400 Enlarged prostate without lower urinary tract symptoms: Secondary | ICD-10-CM | POA: Diagnosis present

## 2015-11-13 DIAGNOSIS — R42 Dizziness and giddiness: Secondary | ICD-10-CM

## 2015-11-13 DIAGNOSIS — I2510 Atherosclerotic heart disease of native coronary artery without angina pectoris: Secondary | ICD-10-CM | POA: Diagnosis present

## 2015-11-13 DIAGNOSIS — E785 Hyperlipidemia, unspecified: Secondary | ICD-10-CM | POA: Diagnosis present

## 2015-11-13 DIAGNOSIS — Z7901 Long term (current) use of anticoagulants: Secondary | ICD-10-CM

## 2015-11-13 DIAGNOSIS — B488 Other specified mycoses: Secondary | ICD-10-CM | POA: Diagnosis present

## 2015-11-13 DIAGNOSIS — R262 Difficulty in walking, not elsewhere classified: Secondary | ICD-10-CM

## 2015-11-13 DIAGNOSIS — I4891 Unspecified atrial fibrillation: Secondary | ICD-10-CM | POA: Diagnosis present

## 2015-11-13 DIAGNOSIS — B49 Unspecified mycosis: Secondary | ICD-10-CM

## 2015-11-13 DIAGNOSIS — E1165 Type 2 diabetes mellitus with hyperglycemia: Secondary | ICD-10-CM | POA: Diagnosis present

## 2015-11-13 DIAGNOSIS — E871 Hypo-osmolality and hyponatremia: Secondary | ICD-10-CM | POA: Diagnosis present

## 2015-11-13 DIAGNOSIS — Z794 Long term (current) use of insulin: Secondary | ICD-10-CM

## 2015-11-13 DIAGNOSIS — E1149 Type 2 diabetes mellitus with other diabetic neurological complication: Secondary | ICD-10-CM | POA: Diagnosis present

## 2015-11-13 DIAGNOSIS — N3000 Acute cystitis without hematuria: Secondary | ICD-10-CM

## 2015-11-13 HISTORY — DX: Essential (primary) hypertension: I10

## 2015-11-13 LAB — GLUCOSE, CAPILLARY
GLUCOSE-CAPILLARY: 170 mg/dL — AB (ref 65–99)
Glucose-Capillary: 149 mg/dL — ABNORMAL HIGH (ref 65–99)

## 2015-11-13 LAB — URINE MICROSCOPIC-ADD ON

## 2015-11-13 LAB — URINALYSIS, ROUTINE W REFLEX MICROSCOPIC
Glucose, UA: NEGATIVE mg/dL
Ketones, ur: 15 mg/dL — AB
Nitrite: POSITIVE — AB
Protein, ur: 100 mg/dL — AB
Specific Gravity, Urine: 1.028 (ref 1.005–1.030)
pH: 5.5 (ref 5.0–8.0)

## 2015-11-13 LAB — COMPREHENSIVE METABOLIC PANEL
ALT: 37 U/L (ref 17–63)
AST: 36 U/L (ref 15–41)
Albumin: 2.7 g/dL — ABNORMAL LOW (ref 3.5–5.0)
Alkaline Phosphatase: 95 U/L (ref 38–126)
Anion gap: 8 (ref 5–15)
BUN: 16 mg/dL (ref 6–20)
CO2: 29 mmol/L (ref 22–32)
Calcium: 8.6 mg/dL — ABNORMAL LOW (ref 8.9–10.3)
Chloride: 95 mmol/L — ABNORMAL LOW (ref 101–111)
Creatinine, Ser: 1.09 mg/dL (ref 0.61–1.24)
GFR calc Af Amer: >60 mL/min (ref >60)
GFR calc non Af Amer: >60 mL/min (ref >60)
Glucose, Bld: 190 mg/dL — ABNORMAL HIGH (ref 65–99)
Potassium: 4.4 mmol/L (ref 3.5–5.1)
Sodium: 132 mmol/L — ABNORMAL LOW (ref 135–145)
Total Bilirubin: 0.9 mg/dL (ref 0.3–1.2)
Total Protein: 6.9 g/dL (ref 6.5–8.1)

## 2015-11-13 LAB — APTT: APTT: 59 s — AB (ref 24–36)

## 2015-11-13 LAB — CBC WITH DIFFERENTIAL/PLATELET
Basophils Absolute: 0 10*3/uL (ref 0.0–0.1)
Basophils Relative: 0 %
Eosinophils Absolute: 0.1 10*3/uL (ref 0.0–0.7)
Eosinophils Relative: 0 %
HCT: 31 % — ABNORMAL LOW (ref 39.0–52.0)
Hemoglobin: 10.2 g/dL — ABNORMAL LOW (ref 13.0–17.0)
Lymphocytes Relative: 7 %
Lymphs Abs: 1.2 10*3/uL (ref 0.7–4.0)
MCH: 29.6 pg (ref 26.0–34.0)
MCHC: 32.9 g/dL (ref 30.0–36.0)
MCV: 89.9 fL (ref 78.0–100.0)
Monocytes Absolute: 1.3 10*3/uL — ABNORMAL HIGH (ref 0.1–1.0)
Monocytes Relative: 9 %
Neutro Abs: 12.9 10*3/uL — ABNORMAL HIGH (ref 1.7–7.7)
Neutrophils Relative %: 84 %
Platelets: 559 10*3/uL — ABNORMAL HIGH (ref 150–400)
RBC: 3.45 MIL/uL — ABNORMAL LOW (ref 4.22–5.81)
RDW: 14.3 % (ref 11.5–15.5)
WBC: 15.5 10*3/uL — ABNORMAL HIGH (ref 4.0–10.5)

## 2015-11-13 LAB — LACTIC ACID, PLASMA
LACTIC ACID, VENOUS: 1.2 mmol/L (ref 0.5–1.9)
Lactic Acid, Venous: 1.3 mmol/L (ref 0.5–1.9)

## 2015-11-13 LAB — HEPARIN LEVEL (UNFRACTIONATED)

## 2015-11-13 LAB — TROPONIN I
TROPONIN I: 0.41 ng/mL — AB (ref <0.03)
Troponin I: 0.15 ng/mL (ref <0.03)

## 2015-11-13 LAB — C DIFFICILE QUICK SCREEN W PCR REFLEX
C DIFFICILE (CDIFF) INTERP: NOT DETECTED
C DIFFICILE (CDIFF) TOXIN: NEGATIVE
C Diff antigen: NEGATIVE

## 2015-11-13 LAB — PROCALCITONIN: Procalcitonin: 0.39 ng/mL

## 2015-11-13 MED ORDER — SODIUM CHLORIDE 0.9 % IV BOLUS (SEPSIS)
500.0000 mL | Freq: Once | INTRAVENOUS | Status: AC
  Administered 2015-11-13: 500 mL via INTRAVENOUS

## 2015-11-13 MED ORDER — HEPARIN (PORCINE) IN NACL 100-0.45 UNIT/ML-% IJ SOLN
850.0000 [IU]/h | INTRAMUSCULAR | Status: DC
  Administered 2015-11-13: 1400 [IU]/h via INTRAVENOUS
  Administered 2015-11-14: 1150 [IU]/h via INTRAVENOUS
  Filled 2015-11-13 (×2): qty 250

## 2015-11-13 MED ORDER — INSULIN GLARGINE 100 UNIT/ML ~~LOC~~ SOLN
25.0000 [IU] | Freq: Every day | SUBCUTANEOUS | Status: DC
  Administered 2015-11-13 – 2015-11-14 (×2): 25 [IU] via SUBCUTANEOUS
  Filled 2015-11-13 (×3): qty 0.25

## 2015-11-13 MED ORDER — ACETAMINOPHEN 650 MG RE SUPP: 650.0000 mg | Freq: Four times a day (QID) | RECTAL | Status: DC | PRN

## 2015-11-13 MED ORDER — METRONIDAZOLE IN NACL 5-0.79 MG/ML-% IV SOLN
500.0000 mg | Freq: Three times a day (TID) | INTRAVENOUS | Status: DC
  Administered 2015-11-13 – 2015-11-16 (×9): 500 mg via INTRAVENOUS
  Filled 2015-11-13 (×9): qty 100

## 2015-11-13 MED ORDER — SODIUM CHLORIDE 0.9 % IV SOLN
INTRAVENOUS | Status: DC
  Administered 2015-11-13 (×2): via INTRAVENOUS

## 2015-11-13 MED ORDER — INSULIN ASPART 100 UNIT/ML ~~LOC~~ SOLN
0.0000 [IU] | SUBCUTANEOUS | Status: DC
  Administered 2015-11-13: 2 [IU] via SUBCUTANEOUS
  Administered 2015-11-14: 1 [IU] via SUBCUTANEOUS
  Administered 2015-11-14: 2 [IU] via SUBCUTANEOUS
  Administered 2015-11-15: 1 [IU] via SUBCUTANEOUS

## 2015-11-13 MED ORDER — DEXTROSE 5 % IV SOLN: 1.0000 g | INTRAVENOUS | Status: DC

## 2015-11-13 MED ORDER — ACETAMINOPHEN 325 MG PO TABS
650.0000 mg | ORAL_TABLET | Freq: Four times a day (QID) | ORAL | Status: DC | PRN
  Administered 2015-11-14 (×2): 650 mg via ORAL
  Filled 2015-11-13 (×2): qty 2

## 2015-11-13 MED ORDER — AMIODARONE HCL 200 MG PO TABS
200.0000 mg | ORAL_TABLET | Freq: Two times a day (BID) | ORAL | Status: DC
  Administered 2015-11-13 – 2015-11-20 (×14): 200 mg via ORAL
  Filled 2015-11-13 (×14): qty 1

## 2015-11-13 MED ORDER — DEXTROSE 5 % IV SOLN
1.0000 g | INTRAVENOUS | Status: DC
  Administered 2015-11-13 – 2015-11-15 (×3): 1 g via INTRAVENOUS
  Filled 2015-11-13 (×4): qty 10

## 2015-11-13 NOTE — Progress Notes (Signed)
Pt arrived to unit from ER.  Pt placed on telemetry and CCMD notified.  Pt oriented to room including call light and telephone.  Pt denies pain and nausea at this time.  Will cont to monitor.

## 2015-11-13 NOTE — ED Triage Notes (Signed)
[facility]- pt here with c/o n/v/d X2 weeks. Pt had CABG on 9/15 and has had GI symptoms since d/c. Pt also had near syncopal episode this morning with dizziness. Pt alert and oriented with EMS, stroke screen negative.

## 2015-11-13 NOTE — ED Notes (Signed)
Attempted report 

## 2015-11-13 NOTE — H&P (Signed)
History and Physical    ESIAS MCTIER [id number] DOB: Feb 13, 1946 DOA: 11/13/2015   PCP: Drema Pry, DO   Patient coming from/Resides with: Private residence/lives with wife  Admission status: Observation/telemetry -need to reevaluate admission status in the first 24 hours because it may be medically necessary to stay a minimum 2 midnights to rule out impending and/or unexpected changes in physiologic status that may differ from initial evaluation performed in the ER and/or at time of admission.   Chief Complaint: Dizziness and weakness in setting of nausea vomiting and diarrhea  HPI: [name] is a 70 y.o. male with medical history significant for known aortic stenosis was dilated ascending aorta. Preoperative evaluation included cardiac catheterization which revealed multivessel disease apparently asymptomatic. Patient subsequently underwent CABG 4 with tissue aortic valve replacement on 11/01/15. He was discharged on 9/25 and had an uneventful postoperative course except for postoperative atrial fibrillation with RVR. He had previously been on Lopressor and amiodarone was added. He was started on Xarelto. He also has history of diabetes on insulin and metformin, hypertension, dyslipidemia and BPH. Patient reports generalized weakness since discharge and for the past 2 days he has been experiencing nausea and vomiting with watery diarrhea. No blood in the diarrhea or emesis. He's had no abdominal pain. He notified his PCP and went for labs on 9/29. He has had no further bowel movements since 9/29 and was unable to give a stool specimen at the lab. He has not had any fevers or chills. This morning he noticed very dark urine without clots in the urine appeared to have blood in it. Because of weakness and dizziness with a near-syncopal event this morning patient was sent to the ER for further evaluation and treatment. Of note patient continued to take his usual home medications including his  Lasix and Xarelto although on 9/29 he did vomit up all of his medications and he has not taken his Xarelto yet today. Of note in addition to the new hematuria wife and patient noticed just today patient was having bright red blood oozing from beneath the Steri-Strips that are covering the previous pacer wire sites in his abdominal area.  ED Course:  Vital Signs: BP 125/63   Pulse 80   Temp 99.4 F (37.4 C) (Rectal)   Resp 22   Ht 6' (1.829 m)   Wt 89.4 kg (197 lb)   SpO2 97%   BMI 26.72 kg/m  CT head without contrast: No acute intracranial abnormalities Orthostatic vital signs: (Supine) 120/64-86 (seated) 117/63-89 (standing) 87/52-145 Lab data: Sodium 132, potassium 4.4, chloride 95, CO2 29, BUN 16, creatinine 1.09, glucose 190, albumin 2.7, LFTs normal, troponin 0.15, WBC 15,500 with neutrophils 84% and absolute neutrophils 12.9%, hemoglobin 10.2, platelets 559,000, urinalysis markedly abnormal: Turbid appearance, many bacteria, large bilirubin, red color, large amount of urine, 15 ketones, moderate leukocytes, positive nitrite, protein 100, RBCs too numerous to count, WBC too numerous to count, urine culture pending. Glucose 190 Medications and treatments: Normal saline bolus 500 mL 1  Review of Systems:  In addition to the HPI above,  No Fever-chills, myalgias or other constitutional symptoms No Headache, changes with Vision or hearing, new weakness, tingling, numbness in any extremity, dysarthria or word finding difficulty, gait disturbance, tremors or seizure activity No problems swallowing food or Liquids, indigestion/reflux, choking or coughing while eating, abdominal pain with or after eating No Chest pain, Cough or Shortness of Breath, palpitations, orthopnea or DOE No Abdominal pain, melena,hematochezia, dark tarry stools No  dysuria, flank pain No new skin rashes, lesions, masses or bruises, No new joint pains, aches, swelling or redness No recent unintentional weight gain or  loss No polyuria, polydypsia or polyphagia   Past Medical History:  Diagnosis Date  . Aortic stenosis    followed by Dr. Acie Fredrickson  . Cancer ([place])   . Carpal tunnel syndrome 09/24/2014   Bilateral  . Coronary artery disease   . Diabetes mellitus, type 2 ([place])   . Erectile dysfunction   . Heart murmur   . Hyperlipidemia   . Hypertension     Past Surgical History:  Procedure Laterality Date  . AORTIC VALVE REPLACEMENT N/A 10/29/2015   Procedure: AORTIC VALVE REPLACEMENT (AVR) WITH 23MM MAGNA EASE TISSUE VALVE.;  Surgeon: [name], MD;  Location: [facility];  Service: Open Heart Surgery;  Laterality: N/A;  . CARDIAC CATHETERIZATION N/A 10/27/2015   Procedure: Left Heart Cath and Coronary Angiography;  Surgeon: [name], MD;  Location: [place] CV [facility];  Service: Cardiovascular;  Laterality: N/A;  . CORONARY ARTERY BYPASS GRAFT N/A 10/29/2015   Procedure: CORONARY ARTERY BYPASS GRAFTING (CABG) x Four UTILIZING THE LEFT INTERNAL MAMMARY ARTERY AND ENDOSCOPICALLY HARVESTED RIGHT SAPEHENEOUS VEINS.;  Surgeon: [name], MD;  Location: [facility];  Service: Open Heart Surgery;  Laterality: N/A;  . CYST REMOVAL HAND Right   . REPLACEMENT ASCENDING AORTA N/A 10/29/2015   Procedure: REPLACEMENT OF ASCENDING AORTA USING 34MM X 30CM WOVEN DOUBLE VELOUR VASCULAR GRAFT.;  Surgeon: [name], MD;  Location: [place];  Service: Open Heart Surgery;  Laterality: N/A;  . TEE WITHOUT CARDIOVERSION N/A 10/29/2015   Procedure: TRANSESOPHAGEAL ECHOCARDIOGRAM (TEE);  Surgeon: [name], MD;  Location: [facility];  Service: Open Heart Surgery;  Laterality: N/A;    Social History   Social History  . Marital status: Married    Spouse name: N/A  . Number of children: N/A  . Years of education: N/A   Occupational History  . Not on file.   Social History Main Topics  . Smoking status: Never Smoker  . Smokeless tobacco: Never Used  . Alcohol use No  . Drug use: No  . Sexual activity:  Yes   Other Topics Concern  . Not on file   Social History Narrative   Married to [name] for 7 years   Retired as Chiropodist   5 children   Never Smoked   Alcohol use- no   Regular exercise: walks around the block ea day   Caffeine use: occasional     Mobility: Rolling walker Work history: Not obtained   Allergies  Allergen Reactions  . No Known Allergies     Family History  Problem Relation Age of Onset  . Cancer Father     Throat cancer  . Diabetes Sister      Prior to Admission medications   Medication Sig Start Date End Date Taking? Authorizing Provider  amiodarone (PACERONE) 200 MG tablet Take 1 tablet (200 mg total) by mouth 2 (two) times daily. 11/08/15   Wayne E Gold, PA-C  aspirin 81 MG tablet Take 81 mg by mouth daily.      Historical Provider, MD  atorvastatin (LIPITOR) 40 MG tablet Take 1 tablet (40 mg total) by mouth daily. 07/29/15   Thayer Headings, MD  famotidine (PEPCID AC) 10 MG chewable tablet Chew 1 tablet (10 mg total) by mouth 2 (two) times daily as needed for heartburn. 10/11/15   Arnette Norris  Deboraha Sprang, MD  ferrous gluconate (FERGON) 324 MG tablet Take 1 tablet (324 mg total) by mouth 2 (two) times daily with a meal. 11/08/15   Wayne E Gold, PA-C  folic acid (FOLVITE) 1 MG tablet Take 1 tablet (1 mg total) by mouth daily. 11/08/15   Wayne E Gold, PA-C  furosemide (LASIX) 40 MG tablet Take 1 tablet (40 mg total) by mouth daily. 11/08/15   Wilder Glade Gold, PA-C  glucose blood test strip Use as instructed 12/25/14   [name], NP  HUMALOG KWIKPEN 100 UNIT/ML KiwkPen USE 5 TO 10 UNITS THREE TIMES DAILY 10-15 MINUTES BEFORE MEALS Patient not taking: Reported on 10/25/2015 08/30/15   [name], NP  Insulin Glargine (LANTUS SOLOSTAR) 100 UNIT/ML Solostar Pen Inject 25 Units into the skin daily at 10 pm. 09/09/14   Doe-Hyun R Shawna Orleans, DO  insulin lispro (HUMALOG KWIKPEN) 100 UNIT/ML KiwkPen Use 5 to 10 units three times daily 10-15 minutes before  meals Patient taking differently: Inject 5-10 Units into the skin 3 (three) times daily before meals.  09/09/14   Doe-Hyun R Shawna Orleans, DO  losartan (COZAAR) 25 MG tablet Take 1 tablet (25 mg total) by mouth daily. 11/08/15   Wayne E Gold, PA-C  metFORMIN (GLUCOPHAGE) 1000 MG tablet Take 1 tablet (1,000 mg total) by mouth 2 (two) times daily with a meal. 09/07/15   [name], NP  Metoprolol Tartrate 75 MG TABS Take 75 mg by mouth 2 (two) times daily. 11/08/15   Wayne E Gold, PA-C  ondansetron (ZOFRAN ODT) 4 MG disintegrating tablet Take 1 tablet (4 mg total) by mouth every 8 (eight) hours as needed for nausea or vomiting. 11/11/15   [name], MD  oxyCODONE (OXY IR/ROXICODONE) 5 MG immediate release tablet Take 1-2 tablets (5-10 mg total) by mouth every 6 (six) hours as needed for severe pain. 11/08/15   Wayne E Gold, PA-C  potassium chloride SA (K-DUR,KLOR-CON) 20 MEQ tablet Take 1 tablet (20 mEq total) by mouth daily. 11/08/15   Wayne E Gold, PA-C  rivaroxaban (XARELTO) 20 MG TABS tablet Take 1 tablet (20 mg total) by mouth daily with supper. 11/08/15   Freddrick March, PA-C    Physical Exam: Vitals:   11/13/15 1345 11/13/15 1415 11/13/15 1430 11/13/15 1445  BP: 122/63 123/64 128/67 125/63  Pulse: 84 83 83 80  Resp: 26 17 16 22   Temp:      TempSrc:      SpO2: 97% 97% 97% 97%  Weight:      Height:          Constitutional: NAD, calm, comfortable Eyes: PERRL, lids and conjunctivae normal ENMT: Mucous membranes are Dry. Posterior pharynx clear of any exudate or lesions.Normal dentition.  Neck: normal, supple, no masses, no thyromegaly Respiratory: clear to auscultation bilaterally, no wheezing, no crackles. Normal respiratory effort. No accessory muscle use.  Cardiovascular: Regular rate and rhythm, no murmurs / rubs / gallops. No extremity edema. 2+ pedal pulses. No carotid bruits.  Abdomen: no tenderness, no masses palpated. No hepatosplenomegaly. Bowel sounds positive.  Genitourinary:  Urine in the urinal very dark in appearance, malodorous and maroon colored but no evidence of clots Musculoskeletal: no clubbing / cyanosis. No joint deformity upper and lower extremities. Good ROM, no contractures. Normal muscle tone.  Skin: no rashes, lesions, ulcers. No induration-incision sites from pacer wires covered by Steri-Strips but evidence of prior bloody issues is recent Neurologic: CN 2-12 grossly intact. Sensation intact, DTR normal. Strength 5/5  x all 4 extremities.  Psychiatric: Normal judgment and insight. Alert and oriented x 3. Normal mood.    Labs on Admission: I have personally reviewed following labs and imaging studies  CBC:  Recent Labs Lab 11/13/15 1051  WBC 15.5*  NEUTROABS 12.9*  HGB 10.2*  HCT 31.0*  MCV 89.9  PLT 0000000*   Basic Metabolic Panel:  Recent Labs Lab 11/13/15 1051  NA 132*  K 4.4  CL 95*  CO2 29  GLUCOSE 190*  BUN 16  CREATININE 1.09  CALCIUM 8.6*   GFR: Estimated Creatinine Clearance: 71.2 mL/min (by C-G formula based on SCr of 1.09 mg/dL). Liver Function Tests:  Recent Labs Lab 11/13/15 1051  AST 36  ALT 37  ALKPHOS 95  BILITOT 0.9  PROT 6.9  ALBUMIN 2.7*   No results for input(s): LIPASE, AMYLASE in the last 168 hours. No results for input(s): AMMONIA in the last 168 hours. Coagulation Profile: No results for input(s): INR, PROTIME in the last 168 hours. Cardiac Enzymes:  Recent Labs Lab 11/13/15 1051  TROPONINI 0.15*   BNP (last 3 results) No results for input(s): PROBNP in the last 8760 hours. HbA1C: No results for input(s): HGBA1C in the last 72 hours. CBG:  Recent Labs Lab 11/07/15 1117 11/07/15 1628 11/07/15 2139 11/08/15 0703 11/08/15 1131  GLUCAP 127* 187* 163* 124* 187*   Lipid Profile: No results for input(s): CHOL, HDL, LDLCALC, TRIG, CHOLHDL, LDLDIRECT in the last 72 hours. Thyroid Function Tests: No results for input(s): TSH, T4TOTAL, FREET4, T3FREE, THYROIDAB in the last 72  hours. Anemia Panel: No results for input(s): VITAMINB12, FOLATE, FERRITIN, TIBC, IRON, RETICCTPCT in the last 72 hours. Urine analysis:    Component Value Date/Time   COLORURINE YELLOW 10/28/2015 0725   APPEARANCEUR CLEAR 10/28/2015 0725   LABSPEC 1.023 10/28/2015 0725   PHURINE 5.0 10/28/2015 0725   GLUCOSEU >1000 (A) 10/28/2015 0725   GLUCOSEU NEGATIVE 04/06/2010 1554   HGBUR NEGATIVE 10/28/2015 0725   BILIRUBINUR NEGATIVE 10/28/2015 0725   BILIRUBINUR Negative 04/03/2014 1642   KETONESUR 15 (A) 10/28/2015 0725   PROTEINUR NEGATIVE 10/28/2015 0725   UROBILINOGEN 1.0 04/03/2014 1642   UROBILINOGEN 1.0 04/03/2014 0058   NITRITE NEGATIVE 10/28/2015 0725   LEUKOCYTESUR NEGATIVE 10/28/2015 0725   Sepsis Labs: @LABRCNTIP (procalcitonin:4,lacticidven:4) )No results found for this or any previous visit (from the past 240 hour(s)).   Radiological Exams on Admission: Ct Head Wo Contrast  Result Date: 11/13/2015 CLINICAL DATA:  Nausea, vomiting, and diarrhea for 2 weeks. Near syncope. EXAM: CT HEAD WITHOUT CONTRAST TECHNIQUE: Contiguous axial images were obtained from the base of the skull through the vertex without intravenous contrast. COMPARISON:  None. FINDINGS: Brain: No subdural, epidural, or subarachnoid hemorrhage. Ventricles and sulci are normal. Cerebellum, brainstem, and basal cisterns are normal. No acute cortical ischemia or infarct. Vascular: Calcified atherosclerosis is seen in the intracranial portions of the carotid arteries. Skull: Normal. Negative for fracture or focal lesion. Sinuses/Orbits: No acute finding. Other: No other abnormalities. IMPRESSION: No acute intracranial abnormalities. Electronically Signed   By: Dorise Bullion III M.D   On: 11/13/2015 11:56    EKG: (Independently reviewed) sinus rhythm with ventricular rate 83 bpm, QTC 498 ms, low voltage QRS, slightly delayed R-wave progression in septal leads, no definitive ischemic  changes  Assessment/Plan Principal Problem:   Orthostatic near syncope -Patient presents with several day history of recurrent nausea and vomiting with watery diarrhea associated with dizziness and found to be negative currently orthostatic orthostatic when checked  in the ER: SBP decreased from 120 to 87 with heart rate increasing from 86 to 145 -Continue IV fluid hydration -Hold offending medications: Lasix, Cozaar -Activity only with assistance -Repeat orthostatic vital signs in a.m. -Treat underlying causes  Active Problems:   Nausea vomiting and diarrhea -Differential includes: Viral gastro-enteritis versus C. difficile colitis (recent hospitalization) versus secondary to UTI -Chek gastrointestinal stool pathogen panel as well as C. difficile PCR -Empiric Flagyl -No blood in stool or emesis -Sips of clear liquids until diarrhea resolved and symptoms improved; recent cessation of diarrhea that could be related to profound dehydration some monitor for recurrence of diarrhea after patient reaches euvolemic state -Contacts/enteric isolation    Hematuria, gross/Abn UA -Started this a.m. -Hold Xarelto in favor of IV heparin with pharmacy managing [place] be adjusted quicker in the event patient develops more significant hematuria -Urinalysis appears consistent with UTI so begin Rocephin -Follow-up on urine culture and obtain blood cultures -Check lactic acid and Procalcitonin -Since no clots at this juncture no indication to notify urology but if hematuria worsens or patient unable to void will need to consult urology    Dehydration with hyponatremia -Secondary to above issues -Normal saline and 125 mL per hour    Type II diabetes mellitus with neurological manifestations, uncontrolled  -Current CBG greater than 150 -Hemoglobin previous admission elevated at 11.2 -Hold metformin during acute illness -Continue preadmission nocturnal Lantus -Follow CBGs and provide SS I     Essential hypertension -Antihypertensive medications on hold as above secondary to orthostasis    Atrial fibrillation (post op after CABG Sept 2017) -Currently rate controlled and was standing then becomes tachycardic -Continue amiodarone -Xarelto on hold secondary to hematuria and failure of shorter acting IV heparin with pharmacy managing -CHADVASc = 4    Elevated troponin -Mild elevation presumed secondary to recent CABG procedure as well as demand ischemia from symptomatic orthostasis -Cycle troponin -EKG unremarkable and patient without any reports of chest pain; also highly unlikely within first week after CABG patient would have graft failure    Hyperlipidemia -Hold preadmission statin until resumed on solid diet    Aortic stenosis s/p tissue valve (Sept 2017)/Hx of CABG (Sept 2017) -See above      DVT prophylaxis: IV heparin per pharmacy  Code Status: Full  Family Communication: Wife and daughter at bedside Disposition Plan: Anticipate discharge back to preadmission home environment once medically stable Consults called: None     Natash Berman L. ANP-BC [facility] Pager [phone]   If 7PM-7AM, please contact night-coverage [email or web address] Password [email or web address]  11/13/2015, 2:54 PM

## 2015-11-13 NOTE — Progress Notes (Signed)
ANTICOAGULATION CONSULT NOTE - Initial Consult  Pharmacy Consult for Heparin (Xarelto prior to admission) Indication: atrial fibrillation  Allergies  Allergen Reactions  . No Known Allergies     Patient Measurements: Height: 6' (182.9 cm) Weight: 197 lb (89.4 kg) IBW/kg (Calculated) : 77.6 Heparin Dosing Weight: 89.4 kg  Vital Signs: Temp: 99.4 F (37.4 C) (09/30 1112) Temp Source: Rectal (09/30 1112) BP: 125/63 (09/30 1445) Pulse Rate: 80 (09/30 1445)  Labs:  Recent Labs  11/13/15 1051  HGB 10.2*  HCT 31.0*  PLT 559*  CREATININE 1.09  TROPONINI 0.15*    Estimated Creatinine Clearance: 71.2 mL/min (by C-G formula based on SCr of 1.09 mg/dL).   Medical History: Past Medical History:  Diagnosis Date  . Aortic stenosis    followed by Dr. Acie Fredrickson  . Cancer ([place])   . Carpal tunnel syndrome 09/24/2014   Bilateral  . Coronary artery disease   . Diabetes mellitus, type 2 ([place])   . Erectile dysfunction   . Heart murmur   . Hyperlipidemia   . Hypertension    Assessment: 59 YOM in ED with near syncopal episode this AM with dizziness but stroke screen negative with history of atrial fibrillation on Xarelto prior to admission. Pharmacy consulted to change Xarelto to IV heparin while work-up being conducted.   Last dose of Xarelto was 11/12/15 PM (around dinnertime).  Hgb is low at 10.2 - stable for past labs.  Platelets are elevated at 559.   Goal of Therapy:  Heparin level 0.3-0.7 units/ml Monitor platelets by anticoagulation protocol: Yes   Plan:  Baseline aPTT and heparin level now due to recent Xarelto.  Start IV heparin rate of 1400 units/hr at 1800 PM - about 24 hours since last Xarelto dose.  Check aPTT and heparin level 6 hours after initiation.  Daily aPTT and heparin levels until values are correlating.  Monitor for signs and symptoms of bleeding.  Sloan Leiter, PharmD, BCPS Clinical Pharmacist [phone] 11/13/2015,2:54 PM

## 2015-11-14 ENCOUNTER — Encounter (HOSPITAL_COMMUNITY): Payer: Self-pay | Admitting: Cardiology

## 2015-11-14 ENCOUNTER — Observation Stay (HOSPITAL_COMMUNITY): Payer: Medicare Other

## 2015-11-14 ENCOUNTER — Telehealth: Payer: Self-pay | Admitting: Cardiology

## 2015-11-14 DIAGNOSIS — I480 Paroxysmal atrial fibrillation: Secondary | ICD-10-CM | POA: Diagnosis not present

## 2015-11-14 DIAGNOSIS — R55 Syncope and collapse: Secondary | ICD-10-CM | POA: Diagnosis present

## 2015-11-14 DIAGNOSIS — R310 Gross hematuria: Secondary | ICD-10-CM

## 2015-11-14 DIAGNOSIS — R748 Abnormal levels of other serum enzymes: Secondary | ICD-10-CM

## 2015-11-14 DIAGNOSIS — I951 Orthostatic hypotension: Principal | ICD-10-CM

## 2015-11-14 DIAGNOSIS — E871 Hypo-osmolality and hyponatremia: Secondary | ICD-10-CM

## 2015-11-14 DIAGNOSIS — Z951 Presence of aortocoronary bypass graft: Secondary | ICD-10-CM

## 2015-11-14 LAB — GASTROINTESTINAL PANEL BY PCR, STOOL (REPLACES STOOL CULTURE)
ADENOVIRUS F40/41: NOT DETECTED
Astrovirus: NOT DETECTED
CAMPYLOBACTER SPECIES: NOT DETECTED
CRYPTOSPORIDIUM: NOT DETECTED
Cyclospora cayetanensis: NOT DETECTED
ENTEROPATHOGENIC E COLI (EPEC): NOT DETECTED
Entamoeba histolytica: NOT DETECTED
Enteroaggregative E coli (EAEC): NOT DETECTED
Enterotoxigenic E coli (ETEC): NOT DETECTED
Giardia lamblia: NOT DETECTED
Norovirus GI/GII: NOT DETECTED
PLESIMONAS SHIGELLOIDES: NOT DETECTED
ROTAVIRUS A: NOT DETECTED
SALMONELLA SPECIES: NOT DETECTED
SHIGA LIKE TOXIN PRODUCING E COLI (STEC): NOT DETECTED
SHIGELLA/ENTEROINVASIVE E COLI (EIEC): NOT DETECTED
Sapovirus (I, II, IV, and V): NOT DETECTED
Vibrio cholerae: NOT DETECTED
Vibrio species: NOT DETECTED
YERSINIA ENTEROCOLITICA: NOT DETECTED

## 2015-11-14 LAB — GLUCOSE, CAPILLARY
GLUCOSE-CAPILLARY: 138 mg/dL — AB (ref 65–99)
GLUCOSE-CAPILLARY: 147 mg/dL — AB (ref 65–99)
GLUCOSE-CAPILLARY: 163 mg/dL — AB (ref 65–99)
Glucose-Capillary: 99 mg/dL (ref 65–99)
Glucose-Capillary: 119 mg/dL — ABNORMAL HIGH (ref 65–99)
Glucose-Capillary: 121 mg/dL — ABNORMAL HIGH (ref 65–99)

## 2015-11-14 LAB — CBC
HEMATOCRIT: 30 % — AB (ref 39.0–52.0)
Hemoglobin: 9.5 g/dL — ABNORMAL LOW (ref 13.0–17.0)
MCH: 28.9 pg (ref 26.0–34.0)
MCHC: 31.7 g/dL (ref 30.0–36.0)
MCV: 91.2 fL (ref 78.0–100.0)
PLATELETS: 620 10*3/uL — AB (ref 150–400)
RBC: 3.29 MIL/uL — AB (ref 4.22–5.81)
RDW: 14.5 % (ref 11.5–15.5)
WBC: 9.4 10*3/uL (ref 4.0–10.5)

## 2015-11-14 LAB — CBC WITH DIFFERENTIAL/PLATELET
BASOS ABS: 0.1 10*3/uL (ref 0.0–0.1)
Basophils Relative: 1 %
Eosinophils Absolute: 0 10*3/uL (ref 0.0–0.7)
Eosinophils Relative: 1 %
HEMATOCRIT: 29.9 % — AB (ref 39.0–52.0)
HEMOGLOBIN: 9.5 g/dL — AB (ref 13.0–17.0)
LYMPHS ABS: 1.5 10*3/uL (ref 0.7–4.0)
LYMPHS PCT: 21 %
MCH: 28.7 pg (ref 26.0–34.0)
MCHC: 31.8 g/dL (ref 30.0–36.0)
MCV: 90.3 fL (ref 78.0–100.0)
Monocytes Absolute: 0.9 10*3/uL (ref 0.1–1.0)
Monocytes Relative: 12 %
NEUTROS ABS: 4.8 10*3/uL (ref 1.7–7.7)
NEUTROS PCT: 65 %
PLATELETS: 521 10*3/uL — AB (ref 150–400)
RBC: 3.31 MIL/uL — AB (ref 4.22–5.81)
RDW: 14.5 % (ref 11.5–15.5)
WBC: 7.3 10*3/uL (ref 4.0–10.5)

## 2015-11-14 LAB — APTT
APTT: 142 s — AB (ref 24–36)
aPTT: 108 seconds — ABNORMAL HIGH (ref 24–36)
aPTT: 144 seconds — ABNORMAL HIGH (ref 24–36)

## 2015-11-14 LAB — HEPARIN LEVEL (UNFRACTIONATED)
HEPARIN UNFRACTIONATED: 1.07 [IU]/mL — AB (ref 0.30–0.70)
Heparin Unfractionated: 0.87 IU/mL — ABNORMAL HIGH (ref 0.30–0.70)
Heparin Unfractionated: 1.5 IU/mL — ABNORMAL HIGH (ref 0.30–0.70)

## 2015-11-14 LAB — COMPREHENSIVE METABOLIC PANEL
ALT: 43 U/L (ref 17–63)
AST: 50 U/L — AB (ref 15–41)
Albumin: 2.7 g/dL — ABNORMAL LOW (ref 3.5–5.0)
Alkaline Phosphatase: 85 U/L (ref 38–126)
Anion gap: 9 (ref 5–15)
BILIRUBIN TOTAL: 1.1 mg/dL (ref 0.3–1.2)
BUN: 8 mg/dL (ref 6–20)
CO2: 25 mmol/L (ref 22–32)
CREATININE: 0.92 mg/dL (ref 0.61–1.24)
Calcium: 8.5 mg/dL — ABNORMAL LOW (ref 8.9–10.3)
Chloride: 103 mmol/L (ref 101–111)
Glucose, Bld: 138 mg/dL — ABNORMAL HIGH (ref 65–99)
Potassium: 3.9 mmol/L (ref 3.5–5.1)
Sodium: 137 mmol/L (ref 135–145)
TOTAL PROTEIN: 6.7 g/dL (ref 6.5–8.1)

## 2015-11-14 LAB — TROPONIN I
TROPONIN I: 0.1 ng/mL — AB (ref <0.03)
TROPONIN I: 0.12 ng/mL — AB (ref <0.03)

## 2015-11-14 MED ORDER — SACCHAROMYCES BOULARDII 250 MG PO CAPS
250.0000 mg | ORAL_CAPSULE | Freq: Two times a day (BID) | ORAL | Status: DC
  Administered 2015-11-14 – 2015-11-20 (×13): 250 mg via ORAL
  Filled 2015-11-14 (×13): qty 1

## 2015-11-14 MED ORDER — SODIUM CHLORIDE 0.9 % IV SOLN
INTRAVENOUS | Status: DC
  Administered 2015-11-14 – 2015-11-15 (×2): via INTRAVENOUS

## 2015-11-14 NOTE — Progress Notes (Signed)
Pt last void darker, brown in color, change from previous urination.  Notified attending.  Call placed to pharmacy.  Will recheck Pt anticoagulation levels.  Cont to monitor.

## 2015-11-14 NOTE — Progress Notes (Addendum)
[address] VillageSuite [address]       [place] [address]             [phone]         Subjective: Feeling better currently c/w yesterday  Objective: Vital signs in last 24 hours: Temp:  [98.3 F (36.8 C)-99.7 F (37.6 C)] 98.3 F (36.8 C) (10/01 0503) Pulse Rate:  [80-91] 82 (10/01 0503) Cardiac Rhythm: Heart block (10/01 0700) Resp:  [12-26] 18 (10/01 0503) BP: (108-134)/(57-68) 108/61 (10/01 0503) SpO2:  [94 %-98 %] 95 % (10/01 0503) Weight:  [187 lb 6.3 oz (85 kg)] 187 lb 6.3 oz (85 kg) (10/01 0503)  Hemodynamic parameters for last 24 hours:    Intake/Output from previous day: 09/30 0701 - 10/01 0700 In: 1691.7 [I.V.:941.7; IV Piggyback:750] Out: -  Intake/Output this shift: Total I/O In: 360 [P.O.:360] Out: -   General appearance: alert, cooperative and no distress Heart: regular rate and rhythm Lungs: coarse BS Abdomen: soft, non-tender Extremities: min edema Wound: incis healing well  Lab Results:  Recent Labs  11/13/15 1051 11/14/15 0917  WBC 15.5* 9.4  HGB 10.2* 9.5*  HCT 31.0* 30.0*  PLT 559* 620*   BMET:  Recent Labs  11/13/15 1051  NA 132*  K 4.4  CL 95*  CO2 29  GLUCOSE 190*  BUN 16  CREATININE 1.09  CALCIUM 8.6*    PT/INR: No results for input(s): LABPROT, INR in the last 72 hours. ABG    Component Value Date/Time   PHART 7.343 (L) 10/30/2015 0046   HCO3 23.0 10/30/2015 0224   TCO2 25 10/31/2015 1622   ACIDBASEDEF 2.0 10/30/2015 0224   O2SAT 98.0 10/30/2015 0224   CBG (last 3)   Recent Labs  11/14/15 0004 11/14/15 0459 11/14/15 0833  GLUCAP 138* 99 119*    Meds Scheduled Meds: . amiodarone  200 mg Oral BID  . cefTRIAXone (ROCEPHIN)  IV  1 g Intravenous Q24H  . insulin aspart  0-9 Units Subcutaneous Q4H  . insulin glargine  25 Units Subcutaneous Q2200  . metronidazole  500 mg Intravenous Q8H   Continuous Infusions: . sodium chloride 75 mL/hr at 11/14/15 0959  . heparin 1,250 Units/hr (11/14/15 0032)     PRN Meds:.acetaminophen **OR** acetaminophen  Xrays Dg Chest 2 View  Result Date: 11/14/2015 CLINICAL DATA:  Medical hx: cancer, diabetes, hypertension. Patient is lightheaded with limited mobility. EXAM: CHEST  2 VIEW COMPARISON:  11/06/2015 FINDINGS: The cardiac silhouette is mildly enlarged. Changes from cardiac surgery and valve replacement are stable. No mediastinal or hilar masses.  No convincing adenopathy. There is left greater than right lung base opacity mildly improved from the prior study. This may reflect pneumonia, atelectasis or a combination. Minimal left pleural effusion. No pneumothorax. Remainder of the lungs is clear. IMPRESSION: 1. Mild improvement in left greater than right lung base opacity. Decreased left pleural fluid. Residual lung base opacity is likely atelectasis. Pneumonia is possible. 2. No pulmonary edema.  No new abnormalities. Electronically Signed   By: Lajean Manes M.D.   On: 11/14/2015 09:27   Dg Abd 1 View  Result Date: 11/14/2015 CLINICAL DATA:  Medical hx: cancer, diabetes, hypertension. Patient is lightheaded with limited mobility. EXAM: ABDOMEN - 1 VIEW COMPARISON:  None. FINDINGS: Normal bowel gas pattern. No evidence of renal or ureteral stones. Scattered aortic and iliac artery vascular calcifications. Soft tissues are otherwise unremarkable. No significant bony abnormality. IMPRESSION: No acute findings. Electronically Signed   By:  Lajean Manes M.D.   On: 11/14/2015 09:28   Ct Head Wo Contrast  Result Date: 11/13/2015 CLINICAL DATA:  Nausea, vomiting, and diarrhea for 2 weeks. Near syncope. EXAM: CT HEAD WITHOUT CONTRAST TECHNIQUE: Contiguous axial images were obtained from the base of the skull through the vertex without intravenous contrast. COMPARISON:  None. FINDINGS: Brain: No subdural, epidural, or subarachnoid hemorrhage. Ventricles and sulci are normal. Cerebellum, brainstem, and basal cisterns are normal. No acute cortical ischemia or infarct.  Vascular: Calcified atherosclerosis is seen in the intracranial portions of the carotid arteries. Skull: Normal. Negative for fracture or focal lesion. Sinuses/Orbits: No acute finding. Other: No other abnormalities. IMPRESSION: No acute intracranial abnormalities. Electronically Signed   By: Dorise Bullion III M.D   On: 11/13/2015 11:56    Assessment/Plan: 1 improving with hydration, agree with holding ARB/diuretic 2 medical management as per primary 3 no specific surgical issues currently  LOS: 0 days    GOLD,WAYNE E 11/14/2015  Patient seen and examined, agree with above 70 yo man s/p CABG x 4, AVR (pericardial) and repair ascending aneurysm by Dr. Servando Snare Was discharged home last week and almost immediately developed nausea and vomiting. Unable to keep even liquids down. Also diarrhea (C diff negative). Near syncopal spell yesterday. Admitted from ED with dehydration. WBC was elevated and UA c/w with UTI . Also had hematuria. Started on ceftriaxone and flagyl. Afebrile and WBC normal this AM. Nausea improved.  Noted to have mildly elevated troponin of 0.41. No acute ECG changes. Suspect mild ischemic insult related to hypotension. I don't think any further work up of that is necessary in the early postop period unless new findings or symptoms occur.  Revonda Standard Roxan Hockey, MD [facility] [phone]

## 2015-11-14 NOTE — Progress Notes (Signed)
PROGRESS NOTE    [name]  [id number] DOB: [birth year] DOA: 11/13/2015 PCP: Drema Pry, DO   Brief Narrative: [name] is a 70 y.o. male with medical history significant for known aortic stenosis was dilated ascending aorta. Preoperative evaluation included cardiac catheterization which revealed multivessel disease apparently asymptomatic. Patient subsequently underwent CABG 4 with tissue aortic valve replacement on 11/01/15. He was discharged on 9/25 and had an uneventful postoperative course except for postoperative atrial fibrillation with RVR. He had previously been on Lopressor and amiodarone was added. He was started on Xarelto. He also has history of diabetes on insulin and metformin, hypertension, dyslipidemia and BPH. Patient reports generalized weakness since discharge and for the past 2 days he has been experiencing nausea and vomiting with watery diarrhea. No blood in the diarrhea or emesis. He's had no abdominal pain. He notified his PCP and went for labs on 9/29. He has had no further bowel movements since 9/29 and was unable to give a stool specimen at the lab. He has not had any fevers or chills. This morning he noticed very dark urine without clots in the urine appeared to have blood in it. Because of weakness and dizziness with a near-syncopal event this morning patient was sent to the ER for further evaluation and treatment. Of note patient continued to take his usual home medications including his Lasix and Xarelto although on 9/29 he did vomit up all of his medications and he has not taken his Xarelto yet today. Of note in addition to the new hematuria wife and patient noticed just today patient was having bright red blood oozing from beneath the Steri-Strips that are covering the previous pacer wire sites in his abdominal area.  Assessment & Plan:   Principal Problem:   Orthostatic syncope Active Problems:   Type II diabetes mellitus with neurological  manifestations, uncontrolled ([place])   Hyperlipidemia   Essential hypertension   Aortic stenosis s/p tissue valve (Sept 2017)   Hx of CABG (Sept 2017)   Atrial fibrillation (post op after CABG Sept 2017)   Nausea vomiting and diarrhea   Hematuria, gross   Dehydration with hyponatremia   Elevated troponin  Orthostatic near syncope -Patient presents with several day history of recurrent nausea and vomiting with watery diarrhea associated with dizziness.  -Continue IV fluid hydration -Hold offending medications: Lasix, Cozaar -Treat underlying causes     Nausea vomiting and diarrhea -Differential includes: Viral gastro-enteritis versus C. difficile colitis (recent hospitalization)  -C diff negative. Awaiting GI pathogen.  -Empiric Flagyl -KUB negative for obstruction.    UTI ;  Hematuria, gross/Abn UA -Started this a.m. -Hold Xarelto in favor of IV heparin with pharmacy managing  -follow urine culture/. Gram negative rods.      Dehydration with hyponatremia -Secondary to above issues - IV fluids, decrease rate this am.     Type II diabetes mellitus with neurological manifestations, uncontrolled  -Current CBG greater than 150 -Hemoglobin previous admission elevated at 11.2 -Hold metformin during acute illness -Continue preadmission nocturnal Lantus -Follow CBGs and provide SS I     Essential hypertension -Antihypertensive medications on hold as above secondary to orthostasis    Atrial fibrillation (post op after CABG Sept 2017) -Currently rate controlled and was standing then becomes tachycardic -Continue amiodarone -Xarelto on hold secondary to hematuria and inability to keep medications down. Resume xarelto when tolerating diet  -CHADVASc = 4    Elevated troponin -Mild elevation presumed secondary to recent CABG procedure as well  as demand ischemia from symptomatic orthostasis     Hyperlipidemia -Hold preadmission statin until resumed on solid diet     Aortic stenosis s/p tissue valve (Sept 2017)/Hx of CABG (Sept 2017) -See above CVTS informed of patient admission.  Also cardiology per CVTS request        DVT prophylaxis: heparin  Code Status: Full code.  Family Communication: wife at bedside.  Disposition Plan: home when stable.    Consultants:   Cardiology  CVTS  Procedures:none   Antimicrobials: ceftriaxone, flagyl.    Subjective: Feeling better, tolerating clear.   Objective: Vitals:   11/13/15 1545 11/13/15 1619 11/13/15 2039 11/14/15 0503  BP: 134/63 (!) 126/59 122/61 108/61  Pulse: 87 88 91 82  Resp: 19 12 16 18   Temp:  99.7 F (37.6 C) 99.5 F (37.5 C) 98.3 F (36.8 C)  TempSrc:  Oral Oral Oral  SpO2: 97% 94% 98% 95%  Weight:    85 kg (187 lb 6.3 oz)  Height:        Intake/Output Summary (Last 24 hours) at 11/14/15 0804 Last data filed at 11/14/15 0041  Gross per 24 hour  Intake          1691.67 ml  Output                0 ml  Net          1691.67 ml   Filed Weights   11/13/15 0944 11/14/15 0503  Weight: 89.4 kg (197 lb) 85 kg (187 lb 6.3 oz)    Examination:  General exam: Appears calm and comfortable  Respiratory system: Clear to auscultation. Respiratory effort normal. Cardiovascular system: S1 & S2 heard, RRR. No JVD, murmurs, rubs, gallops or clicks. No pedal edema. Gastrointestinal system: Abdomen is nondistended, soft and nontender. No organomegaly or masses felt. Normal bowel sounds heard. Central nervous system: Alert and oriented. No focal neurological deficits. Extremities: Symmetric 5 x 5 power. Skin: No rashes, lesions or ulcers Psychiatry: Judgement and insight appear normal. Mood & affect appropriate.     Data Reviewed: I have personally reviewed following labs and imaging studies  CBC:  Recent Labs Lab 11/13/15 1051  WBC 15.5*  NEUTROABS 12.9*  HGB 10.2*  HCT 31.0*  MCV 89.9  PLT 0000000*   Basic Metabolic Panel:  Recent Labs Lab 11/13/15 1051  NA 132*  K  4.4  CL 95*  CO2 29  GLUCOSE 190*  BUN 16  CREATININE 1.09  CALCIUM 8.6*   GFR: Estimated Creatinine Clearance: 71.2 mL/min (by C-G formula based on SCr of 1.09 mg/dL). Liver Function Tests:  Recent Labs Lab 11/13/15 1051  AST 36  ALT 37  ALKPHOS 95  BILITOT 0.9  PROT 6.9  ALBUMIN 2.7*   No results for input(s): LIPASE, AMYLASE in the last 168 hours. No results for input(s): AMMONIA in the last 168 hours. Coagulation Profile: No results for input(s): INR, PROTIME in the last 168 hours. Cardiac Enzymes:  Recent Labs Lab 11/13/15 1051 11/13/15 1633 11/13/15 2352  TROPONINI 0.15* 0.41* 0.10*   BNP (last 3 results) No results for input(s): PROBNP in the last 8760 hours. HbA1C: No results for input(s): HGBA1C in the last 72 hours. CBG:  Recent Labs Lab 11/08/15 1131 11/13/15 1726 11/13/15 2035 11/14/15 0004 11/14/15 0459  GLUCAP 187* 149* 170* 138* 99   Lipid Profile: No results for input(s): CHOL, HDL, LDLCALC, TRIG, CHOLHDL, LDLDIRECT in the last 72 hours. Thyroid Function Tests: No results for input(s): TSH,  T4TOTAL, FREET4, T3FREE, THYROIDAB in the last 72 hours. Anemia Panel: No results for input(s): VITAMINB12, FOLATE, FERRITIN, TIBC, IRON, RETICCTPCT in the last 72 hours. Sepsis Labs:  Recent Labs Lab 11/13/15 1428 11/13/15 1442 11/13/15 1915  PROCALCITON 0.39  --   --   LATICACIDVEN  --  1.2 1.3    Recent Results (from the past 240 hour(s))  C difficile quick scan w PCR reflex     Status: None   Collection Time: 11/13/15  2:25 PM  Result Value Ref Range Status   C Diff antigen NEGATIVE NEGATIVE Final   C Diff toxin NEGATIVE NEGATIVE Final   C Diff interpretation No C. difficile detected.  Final         Radiology Studies: Ct Head Wo Contrast  Result Date: 11/13/2015 CLINICAL DATA:  Nausea, vomiting, and diarrhea for 2 weeks. Near syncope. EXAM: CT HEAD WITHOUT CONTRAST TECHNIQUE: Contiguous axial images were obtained from the base  of the skull through the vertex without intravenous contrast. COMPARISON:  None. FINDINGS: Brain: No subdural, epidural, or subarachnoid hemorrhage. Ventricles and sulci are normal. Cerebellum, brainstem, and basal cisterns are normal. No acute cortical ischemia or infarct. Vascular: Calcified atherosclerosis is seen in the intracranial portions of the carotid arteries. Skull: Normal. Negative for fracture or focal lesion. Sinuses/Orbits: No acute finding. Other: No other abnormalities. IMPRESSION: No acute intracranial abnormalities. Electronically Signed   By: Dorise Bullion III M.D   On: 11/13/2015 11:56        Scheduled Meds: . amiodarone  200 mg Oral BID  . cefTRIAXone (ROCEPHIN)  IV  1 g Intravenous Q24H  . insulin aspart  0-9 Units Subcutaneous Q4H  . insulin glargine  25 Units Subcutaneous Q2200  . metronidazole  500 mg Intravenous Q8H   Continuous Infusions: . sodium chloride 125 mL/hr at 11/13/15 2331  . heparin 1,250 Units/hr (11/14/15 0032)     LOS: 0 days    Time spent: 35 minutes.     Elmarie Shiley, MD [facility] Pager [phone]  If 7PM-7AM, please contact night-coverage [email or web address] Password [email or web address] 11/14/2015, 8:04 AM

## 2015-11-14 NOTE — Telephone Encounter (Signed)
Error

## 2015-11-14 NOTE — Progress Notes (Signed)
[place] for Heparin  Indication: atrial fibrillation  Allergies  Allergen Reactions  . No Known Allergies     Patient Measurements: Height: 6' (182.9 cm) Weight: 197 lb (89.4 kg) IBW/kg (Calculated) : 77.6 Heparin Dosing Weight: 89.4 kg  Vital Signs: Temp: 99.5 F (37.5 C) (09/30 2039) Temp Source: Oral (09/30 2039) BP: 122/61 (09/30 2039) Pulse Rate: 91 (09/30 2039)  Labs:  Recent Labs  11/13/15 1051 11/13/15 1633 11/13/15 2352  HGB 10.2*  --   --   HCT 31.0*  --   --   PLT 559*  --   --   APTT 59*  --  142*  HEPARINUNFRC  --  >2.20* 1.50*  CREATININE 1.09  --   --   TROPONINI 0.15* 0.41*  --     Estimated Creatinine Clearance: 71.2 mL/min (by C-G formula based on SCr of 1.09 mg/dL).  Assessment: 70 y.o. male with h/o Afib, Xarelto on hold, for heparin  Goal of Therapy:  APTT 66-102 while Xarelto interferring with anti-Xa level Heparin level 0.3-0.7 units/ml Monitor platelets by anticoagulation protocol: Yes   Plan:  Decrease Heparin 1250 units/hr Follow-up am labs.    Phillis Knack, PharmD, BCPS  11/14/2015,12:27 AM

## 2015-11-14 NOTE — Consult Note (Addendum)
Requesting provider: Elmarie Shiley, MD Primary cardiologist: Dr. Mertie Moores Consulting cardiologist: Dr. Satira Sark  Reason for consultation: Recent orthostasis, PAF  Clinical Summary [name] is a 70 y.o.male with past mental history outlined below, recently discharged from the hospital on September 25 status post four-vessel CABG and aortic valve replacement ([employer] pericardial tissue valve) for management of aortic stenosis and CAD. Postoperative course was complicated by paroxysmal atrial fibrillation he was treated with amiodarone and Xarelto. He is now readmitted to the hospital after recent 2-3 day history of recurring nausea and emesis as well as diarrhea resulting in orthostasis and low blood pressure. He has been hydrated after discontinuation of Lasix and Cozaar. Due to hematuria his Xarelto has also been held, although he was placed on IV heparin temporarily. He is starting to feel better, was able to eat a little bit today. No further loose stools or nausea.  Telemetry has shown some episodes of recurrent PAF, although at present he is in sinus rhythm. He has been continued on amiodarone. AST 50 and ALT 43. Troponin I levels are mildly elevated at 0.10 and 0.12. ECG from 11/13/2015 showed sinus rhythm with anteroseptal Q waves, nonspecific T-wave changes, and rightward axis.  Allergies  Allergen Reactions  . No Known Allergies     Medications Scheduled Medications: . amiodarone  200 mg Oral BID  . cefTRIAXone (ROCEPHIN)  IV  1 g Intravenous Q24H  . insulin aspart  0-9 Units Subcutaneous Q4H  . insulin glargine  25 Units Subcutaneous Q2200  . metronidazole  500 mg Intravenous Q8H    Infusions: . sodium chloride 75 mL/hr at 11/14/15 0959  . heparin 1,150 Units/hr (11/14/15 1111)    PRN Medications: acetaminophen **OR** acetaminophen   Past Medical History:  Diagnosis Date  . 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 Cancer ([place])   . Carpal tunnel syndrome 09/24/2014   Bilateral  . 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 Diabetes mellitus, type 2 ([place])   . Erectile dysfunction   . Essential hypertension   . Hyperlipidemia     Past Surgical History:  Procedure Laterality Date  . AORTIC VALVE REPLACEMENT N/A 10/29/2015   Procedure: AORTIC VALVE REPLACEMENT (AVR) WITH 23MM MAGNA EASE TISSUE VALVE.;  Surgeon: [name], MD;  Location: [place] [facility];  Service: Open Heart Surgery;  Laterality: N/A;  . CARDIAC CATHETERIZATION N/A 10/27/2015   Procedure: Left Heart Cath and Coronary Angiography;  Surgeon: [name], MD;  Location: [facility];  Service: Cardiovascular;  Laterality: N/A;  . CORONARY ARTERY BYPASS GRAFT N/A 10/29/2015   Procedure: CORONARY ARTERY BYPASS GRAFTING (CABG) x Four UTILIZING THE LEFT INTERNAL MAMMARY ARTERY AND ENDOSCOPICALLY HARVESTED RIGHT SAPEHENEOUS VEINS.;  Surgeon: [name], MD;  Location: [facility];  Service: Open Heart Surgery;  Laterality: N/A;  . CYST REMOVAL HAND Right   . REPLACEMENT ASCENDING AORTA N/A 10/29/2015   Procedure: REPLACEMENT OF ASCENDING AORTA USING 34MM X 30CM WOVEN DOUBLE VELOUR VASCULAR GRAFT.;  Surgeon: [name], MD;  Location: [facility];  Service: Open Heart Surgery;  Laterality: N/A;  . TEE WITHOUT CARDIOVERSION N/A 10/29/2015   Procedure: TRANSESOPHAGEAL ECHOCARDIOGRAM (TEE);  Surgeon: [name], MD;  Location: [facility];  Service: Open Heart Surgery;  Laterality: N/A;    Family History  Problem Relation Age of Onset  . Cancer Father  Throat cancer  . Diabetes Sister     Social History Mr. Naccarato reports that he has never smoked. He has never used smokeless tobacco. Mr. Milici reports that he does not drink alcohol.  Review of Systems Complete review of systems negative except as otherwise outlined in the clinical summary and also the following. No fevers or chills. No  abdominal pain.  Physical Examination Blood pressure 131/61, pulse 89, temperature 98.9 F (37.2 C), temperature source Oral, resp. rate 16, height 6' (1.829 m), weight 187 lb 6.3 oz (85 kg), SpO2 98 %.  Intake/Output Summary (Last 24 hours) at 11/14/15 1345 Last data filed at 11/14/15 1325  Gross per 24 hour  Intake          2411.67 ml  Output                0 ml  Net          2411.67 ml   Telemetry: Sinus rhythm at present.  Gen.: Patient appears comfortable at rest. HEENT: Conjunctiva and lids normal, oropharynx clear. Neck: Supple, no elevated JVP or carotid bruits, no thyromegaly. Thorax: Healing sternal incision, no erythema or drainage. Lungs: Clear to auscultation, decreased breath sounds at the bases, nonlabored breathing at rest. Cardiac: Regular rate and rhythm, no S3, soft systolic murmur, no pericardial rub. Abdomen: Soft, nontender, bowel sounds present, no guarding or rebound. Extremities: No pitting edema, distal pulses 2+. Skin: Warm and dry. Musculoskeletal: No kyphosis. Neuropsychiatric: Alert and oriented x3, affect grossly appropriate.  Lab Results  Basic Metabolic Panel:  Recent Labs Lab 11/13/15 1051 11/14/15 0917  NA 132* 137  K 4.4 3.9  CL 95* 103  CO2 29 25  GLUCOSE 190* 138*  BUN 16 8  CREATININE 1.09 0.92  CALCIUM 8.6* 8.5*    Liver Function Tests:  Recent Labs Lab 11/13/15 1051 11/14/15 0917  AST 36 50*  ALT 37 43  ALKPHOS 95 85  BILITOT 0.9 1.1  PROT 6.9 6.7  ALBUMIN 2.7* 2.7*    CBC:  Recent Labs Lab 11/13/15 1051 11/14/15 0917  WBC 15.5* 9.4  NEUTROABS 12.9*  --   HGB 10.2* 9.5*  HCT 31.0* 30.0*  MCV 89.9 91.2  PLT 559* 620*    Cardiac Enzymes:  Recent Labs Lab 11/13/15 1051 11/13/15 1633 11/13/15 2352 11/14/15 0917  TROPONINI 0.15* 0.41* 0.10* 0.12*    Imaging Chest x-ray 11/14/2015: FINDINGS: The cardiac silhouette is mildly enlarged. Changes from cardiac surgery and valve replacement are  stable.  No mediastinal or hilar masses.  No convincing adenopathy.  There is left greater than right lung base opacity mildly improved from the prior study. This may reflect pneumonia, atelectasis or a combination. Minimal left pleural effusion. No pneumothorax. Remainder of the lungs is clear.  IMPRESSION: 1. Mild improvement in left greater than right lung base opacity. Decreased left pleural fluid. Residual lung base opacity is likely atelectasis. Pneumonia is possible. 2. No pulmonary edema.  No new abnormalities.  Impression  1. Presentation with orthostasis secondary to fluid loss in the setting of recurring nausea and emesis as well as diarrhea over the last few days. Patient is feeling better, receiving IV hydration able to son today. No fevers or chills. LFTs largely unremarkable. Lasix and Cozaar been held temporarily.  2. Paroxysmal atrial fibrillation in the postoperative setting. He is continuing on amiodarone and is in sinus rhythm today. Xarelto has been placed on hold in light of recent hematuria, although heparin instituted.  3. Multivessel  CAD status post CABG recently in September, LIMA to LAD, SVG to first and second OM, SVG to RCA.  4. Aortic stenosis status post pericardial AVR at the time of CABG, [employer] valve with replacement of ascending aorta.  5. Minor troponin I elevation, doubt of major clinical significance in light of recent heart surgery.  Recommendations  Discussed with hospitalist team. Agree with hydration and temporary hold on Lasix and Cozaar. Blood pressure looks to be stable today. If he is able to start taking PO's normally without recurring symptoms, may be able to resume prior therapy gradually. Would aim to transition off of heparin and back to Xarelto presuming hematuria resolves and does not require further evaluation/workup acutely. Repeat orthostatics tomorrow. Continue telemetry monitoring with some recurrent PAF noted. He  should try and stay on amiodarone for now.  Satira Sark, M.D., F.A.C.C.

## 2015-11-14 NOTE — Progress Notes (Signed)
[facility] for Heparin  Indication: atrial fibrillation  Allergies  Allergen Reactions  . No Known Allergies     Patient Measurements: Height: 6' (182.9 cm) Weight: 187 lb 6.3 oz (85 kg) IBW/kg (Calculated) : 77.6 Heparin Dosing Weight: 89.4 kg  Vital Signs: Temp: 98.3 F (36.8 C) (10/01 0503) Temp Source: Oral (10/01 0503) BP: 108/61 (10/01 0503) Pulse Rate: 82 (10/01 0503)  Labs:  Recent Labs  11/13/15 1051 11/13/15 1633 11/13/15 2352 11/14/15 0917  HGB 10.2*  --   --  9.5*  HCT 31.0*  --   --  30.0*  PLT 559*  --   --  620*  APTT 59*  --  142* 108*  HEPARINUNFRC  --  >2.20* 1.50* 1.07*  CREATININE 1.09  --   --  0.92  TROPONINI 0.15* 0.41* 0.10* 0.12*    Estimated Creatinine Clearance: 84.3 mL/min (by C-G formula based on SCr of 0.92 mg/dL).  Assessment: 70 y.o. male with h/o Afib, Xarelto on hold, for heparin  Goal of Therapy:  APTT 66-102 while Xarelto interferring with anti-Xa level Heparin level 0.3-0.7 units/ml Monitor platelets by anticoagulation protocol: Yes   Plan:  Decrease Heparin 1150 units/hr Follow-up am labs.    Thank you Anette Guarneri, PharmD [phone] 11/14/2015,11:10 AM

## 2015-11-14 NOTE — Care Management Obs Status (Signed)
[facility] NOTIFICATION   Patient Details  Name: [name] MRN: [id number] Date of Birth: [birth year]   Medicare Observation Status Notification Given:  Yes    Guido Sander, RN 11/14/2015, 4:59 PM

## 2015-11-14 NOTE — ED Provider Notes (Signed)
[facility] DEPT Provider Note   CSN: [id number] Arrival date & time: 11/13/15  0941     History   Chief Complaint Chief Complaint  Patient presents with  . Emesis  . Near Syncope    HPI [name] is a 70 y.o. male.  HPI Patient presents to the emergency department with dizziness has been intermittent for the last week.  The patient states that it mostly happens with position change, especially first thing in the morning.  He states once he gets up and moving he feels better.  Patient states nothing seems make the condition better.  He states he does not have any recent medication changes. The patient denies chest pain, shortness of breath, headache,blurred vision, neck pain, fever, cough, weakness, numbness,anorexia, edema, abdominal pain, nausea, vomiting, diarrhea, rash, back pain, dysuria, hematemesis, bloody stool, near syncope, or syncope. Past Medical History:  Diagnosis Date  . 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 Cancer ([place])   . Carpal tunnel syndrome 09/24/2014   Bilateral  . 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 Diabetes mellitus, type 2 ([place])   . Erectile dysfunction   . Essential hypertension   . Hyperlipidemia     Patient Active Problem List   Diagnosis Date Noted  . Near syncope 11/14/2015  . Hx of CABG (Sept 2017) 11/13/2015  . Atrial fibrillation (post op after CABG Sept 2017) 11/13/2015  . Nausea vomiting and diarrhea 11/13/2015  . Orthostatic syncope 11/13/2015  . Hematuria, gross 11/13/2015  . Dehydration with hyponatremia 11/13/2015  . Elevated troponin 11/13/2015  . Coronary artery disease involving native coronary artery of native heart without angina pectoris   . Aortic stenosis s/p tissue valve (Sept 2017) 10/28/2015  . Atypical angina ([place]) 10/27/2015  . Abnormal nuclear stress test 10/27/2015  . Coronary artery disease involving native heart with angina pectoris ([place])  10/27/2015  . Aortic aneurysm ([place]) 07/23/2015  . Carpal tunnel syndrome 09/24/2014  . Right hand pain 12/29/2013  . Left carotid bruit 05/07/2013  . Ganglion cyst 12/30/2012  . Diabetic retinopathy ([place]) 09/23/2012  . Diminished pulses in lower extremity 06/17/2012  . Chest pain 03/22/2011  . Paresthesia of foot 11/04/2010  . Aortic valve disease 04/06/2010  . HYPERTROPHY PROSTATE W/UR OBST & OTH LUTS 03/15/2009  . Hyperlipidemia 12/13/2007  . Type II diabetes mellitus with neurological manifestations, uncontrolled ([place]) 11/29/2007  . ERECTILE DYSFUNCTION 11/29/2007  . Essential hypertension 11/29/2007    Past Surgical History:  Procedure Laterality Date  . AORTIC VALVE REPLACEMENT N/A 10/29/2015   Procedure: AORTIC VALVE REPLACEMENT (AVR) WITH 23MM MAGNA EASE TISSUE VALVE.;  Surgeon: [name], MD;  Location: [facility];  Service: Open Heart Surgery;  Laterality: N/A;  . CARDIAC CATHETERIZATION N/A 10/27/2015   Procedure: Left Heart Cath and Coronary Angiography;  Surgeon: [name], MD;  Location: [facility] CV [facility];  Service: Cardiovascular;  Laterality: N/A;  . CORONARY ARTERY BYPASS GRAFT N/A 10/29/2015   Procedure: CORONARY ARTERY BYPASS GRAFTING (CABG) x Four UTILIZING THE LEFT INTERNAL MAMMARY ARTERY AND ENDOSCOPICALLY HARVESTED RIGHT SAPEHENEOUS VEINS.;  Surgeon: [name], MD;  Location: [facility];  Service: Open Heart Surgery;  Laterality: N/A;  . CYST REMOVAL HAND Right   . REPLACEMENT ASCENDING AORTA N/A 10/29/2015   Procedure: REPLACEMENT OF ASCENDING AORTA USING 34MM X 30CM WOVEN DOUBLE VELOUR VASCULAR GRAFT.;  Surgeon: [name], MD;  Location: [facility];  Service: Open Heart Surgery;  Laterality: N/A;  . TEE WITHOUT CARDIOVERSION N/A 10/29/2015   Procedure: TRANSESOPHAGEAL ECHOCARDIOGRAM (TEE);  Surgeon: [name], MD;  Location: [facility];  Service: Open Heart Surgery;  Laterality: N/A;       Home Medications    Prior to Admission medications     Medication Sig Start Date End Date Taking? Authorizing Provider  amiodarone (PACERONE) 200 MG tablet Take 1 tablet (200 mg total) by mouth 2 (two) times daily. 11/08/15  Yes Wayne E Gold, PA-C  aspirin 81 MG tablet Take 81 mg by mouth daily.     Yes Historical Provider, MD  atorvastatin (LIPITOR) 40 MG tablet Take 1 tablet (40 mg total) by mouth daily. 07/29/15  Yes Thayer Headings, MD  famotidine (PEPCID AC) 10 MG chewable tablet Chew 1 tablet (10 mg total) by mouth 2 (two) times daily as needed for heartburn. 10/11/15  Yes Thayer Headings, MD  ferrous gluconate (FERGON) 324 MG tablet Take 1 tablet (324 mg total) by mouth 2 (two) times daily with a meal. 11/08/15  Yes Wayne E Gold, PA-C  folic acid (FOLVITE) 1 MG tablet Take 1 tablet (1 mg total) by mouth daily. 11/08/15  Yes Wayne E Gold, PA-C  furosemide (LASIX) 40 MG tablet Take 1 tablet (40 mg total) by mouth daily. 11/08/15  Yes Wayne E Gold, PA-C  Insulin Glargine (LANTUS SOLOSTAR) 100 UNIT/ML Solostar Pen Inject 25 Units into the skin daily at 10 pm. 09/09/14  Yes Doe-Hyun R Shawna Orleans, DO  insulin lispro (HUMALOG KWIKPEN) 100 UNIT/ML KiwkPen Use 5 to 10 units three times daily 10-15 minutes before meals Patient taking differently: Inject 5-10 Units into the skin 3 (three) times daily before meals.  09/09/14  Yes Doe-Hyun R Shawna Orleans, DO  losartan (COZAAR) 25 MG tablet Take 1 tablet (25 mg total) by mouth daily. 11/08/15  Yes Wayne E Gold, PA-C  metFORMIN (GLUCOPHAGE) 1000 MG tablet Take 1 tablet (1,000 mg total) by mouth 2 (two) times daily with a meal. 09/07/15  Yes [name], NP  Metoprolol Tartrate 75 MG TABS Take 75 mg by mouth 2 (two) times daily. 11/08/15  Yes Wayne E Gold, PA-C  ondansetron (ZOFRAN ODT) 4 MG disintegrating tablet Take 1 tablet (4 mg total) by mouth every 8 (eight) hours as needed for nausea or vomiting. 11/11/15  Yes [name], MD  oxyCODONE (OXY IR/ROXICODONE) 5 MG immediate release tablet Take 1-2 tablets (5-10 mg total) by  mouth every 6 (six) hours as needed for severe pain. 11/08/15  Yes Wayne E Gold, PA-C  potassium chloride SA (K-DUR,KLOR-CON) 20 MEQ tablet Take 1 tablet (20 mEq total) by mouth daily. 11/08/15  Yes Wayne E Gold, PA-C  rivaroxaban (XARELTO) 20 MG TABS tablet Take 1 tablet (20 mg total) by mouth daily with supper. 11/08/15  Yes Erin R [name], PA-C  glucose blood test strip Use as instructed 12/25/14   [name], NP    Family History Family History  Problem Relation Age of Onset  . Cancer Father     Throat cancer  . Diabetes Sister     Social History Social History  Substance Use Topics  . Smoking status: Never Smoker  . Smokeless tobacco: Never Used  . Alcohol use No     Allergies   No known allergies   Review of Systems Review of Systems  All other systems negative except as documented in the HPI. All pertinent positives and negatives as reviewed in  the HPI. Physical Exam Updated Vital Signs BP 127/61 (BP Location: Right Arm)   Pulse 84   Temp 99.1 F (37.3 C) (Oral)   Resp 18   Ht 6' (1.829 m)   Wt 85 kg   SpO2 97%   BMI 25.41 kg/m   Physical Exam  Constitutional: He is oriented to person, place, and time. He appears well-developed and well-nourished. No distress.  HENT:  Head: Normocephalic and atraumatic.  Mouth/Throat: Oropharynx is clear and moist.  Eyes: Pupils are equal, round, and reactive to light.  Neck: Normal range of motion. Neck supple.  Cardiovascular: Normal rate, regular rhythm and normal heart sounds.  Exam reveals no gallop and no friction rub.   No murmur heard. Pulmonary/Chest: [place] normal and breath sounds normal. No respiratory distress. He has no wheezes.  Abdominal: Soft. Bowel sounds are normal. He exhibits no distension. There is no tenderness.  Neurological: He is alert and oriented to person, place, and time. He has normal strength. No sensory deficit. He exhibits normal muscle tone. Coordination and gait normal. GCS eye subscore  is 4. GCS verbal subscore is 5. GCS motor subscore is 6.  Skin: Skin is warm and dry. No rash noted. No erythema.  Psychiatric: He has a normal mood and affect. His behavior is normal.  Nursing note and vitals reviewed.    ED Treatments / Results  Labs (all labs ordered are listed, but only abnormal results are displayed) Labs Reviewed  URINE CULTURE - Abnormal; Notable for the following:       Result Value   Culture >=100,000 COLONIES/mL GRAM NEGATIVE RODS (*)    All other components within normal limits  COMPREHENSIVE METABOLIC PANEL - Abnormal; Notable for the following:    Sodium 132 (*)    Chloride 95 (*)    Glucose, Bld 190 (*)    Calcium 8.6 (*)    Albumin 2.7 (*)    All other components within normal limits  CBC WITH DIFFERENTIAL/PLATELET - Abnormal; Notable for the following:    WBC 15.5 (*)    RBC 3.45 (*)    Hemoglobin 10.2 (*)    HCT 31.0 (*)    Platelets 559 (*)    Neutro Abs 12.9 (*)    Monocytes Absolute 1.3 (*)    All other components within normal limits  URINALYSIS, ROUTINE W REFLEX MICROSCOPIC (NOT AT [facility]) - Abnormal; Notable for the following:    Color, Urine RED (*)    APPearance TURBID (*)    Hgb urine dipstick LARGE (*)    Bilirubin Urine LARGE (*)    Ketones, ur 15 (*)    Protein, ur 100 (*)    Nitrite POSITIVE (*)    Leukocytes, UA MODERATE (*)    All other components within normal limits  TROPONIN I - Abnormal; Notable for the following:    Troponin I 0.15 (*)    All other components within normal limits  TROPONIN I - Abnormal; Notable for the following:    Troponin I 0.41 (*)    All other components within normal limits  TROPONIN I - Abnormal; Notable for the following:    Troponin I 0.10 (*)    All other components within normal limits  URINE MICROSCOPIC-ADD ON - Abnormal; Notable for the following:    Squamous Epithelial / LPF 0-5 (*)    Bacteria, UA MANY (*)    All other components within normal limits  APTT - Abnormal; Notable for  the following:  aPTT 59 (*)    All other components within normal limits  HEPARIN LEVEL (UNFRACTIONATED) - Abnormal; Notable for the following:    Heparin Unfractionated >2.20 (*)    All other components within normal limits  HEPARIN LEVEL (UNFRACTIONATED) - Abnormal; Notable for the following:    Heparin Unfractionated 1.50 (*)    All other components within normal limits  GLUCOSE, CAPILLARY - Abnormal; Notable for the following:    Glucose-Capillary 149 (*)    All other components within normal limits  APTT - Abnormal; Notable for the following:    aPTT 142 (*)    All other components within normal limits  GLUCOSE, CAPILLARY - Abnormal; Notable for the following:    Glucose-Capillary 170 (*)    All other components within normal limits  GLUCOSE, CAPILLARY - Abnormal; Notable for the following:    Glucose-Capillary 138 (*)    All other components within normal limits  APTT - Abnormal; Notable for the following:    aPTT 108 (*)    All other components within normal limits  CBC - Abnormal; Notable for the following:    RBC 3.29 (*)    Hemoglobin 9.5 (*)    HCT 30.0 (*)    Platelets 620 (*)    All other components within normal limits  COMPREHENSIVE METABOLIC PANEL - Abnormal; Notable for the following:    Glucose, Bld 138 (*)    Calcium 8.5 (*)    Albumin 2.7 (*)    AST 50 (*)    All other components within normal limits  HEPARIN LEVEL (UNFRACTIONATED) - Abnormal; Notable for the following:    Heparin Unfractionated 1.07 (*)    All other components within normal limits  TROPONIN I - Abnormal; Notable for the following:    Troponin I 0.12 (*)    All other components within normal limits  GLUCOSE, CAPILLARY - Abnormal; Notable for the following:    Glucose-Capillary 119 (*)    All other components within normal limits  GLUCOSE, CAPILLARY - Abnormal; Notable for the following:    Glucose-Capillary 121 (*)    All other components within normal limits  GLUCOSE, CAPILLARY -  Abnormal; Notable for the following:    Glucose-Capillary 147 (*)    All other components within normal limits  CBC WITH DIFFERENTIAL/PLATELET - Abnormal; Notable for the following:    RBC 3.31 (*)    Hemoglobin 9.5 (*)    HCT 29.9 (*)    Platelets 521 (*)    All other components within normal limits  HEPARIN LEVEL (UNFRACTIONATED) - Abnormal; Notable for the following:    Heparin Unfractionated 0.87 (*)    All other components within normal limits  GLUCOSE, CAPILLARY - Abnormal; Notable for the following:    Glucose-Capillary 163 (*)    All other components within normal limits  C DIFFICILE QUICK SCREEN W PCR REFLEX  CULTURE, BLOOD (ROUTINE X 2)  CULTURE, BLOOD (ROUTINE X 2)  GASTROINTESTINAL PANEL BY PCR, STOOL (REPLACES STOOL CULTURE)  PROCALCITONIN  LACTIC ACID, PLASMA  LACTIC ACID, PLASMA  GLUCOSE, CAPILLARY  APTT  CBC  HEPARIN LEVEL (UNFRACTIONATED)  BASIC METABOLIC PANEL  APTT    EKG  EKG Interpretation  Date/Time:  Saturday November 13 2015 10:45:50 EDT Ventricular Rate:  83 PR Interval:    QRS Duration: 91 QT Interval:  423 QTC Calculation: 498 R Axis:   92 Text Interpretation:  Normal sinus rhythm Right axis deviation Borderline low voltage, extremity leads Probable anteroseptal infarct, old No significant  change since last tracing Confirmed by [facility] MD, Lysbeth Galas [phone]) on 11/14/2015 9:27:02 AM       Radiology Dg Chest 2 View  Result Date: 11/14/2015 CLINICAL DATA:  Medical hx: cancer, diabetes, hypertension. Patient is lightheaded with limited mobility. EXAM: CHEST  2 VIEW COMPARISON:  11/06/2015 FINDINGS: The cardiac silhouette is mildly enlarged. Changes from cardiac surgery and valve replacement are stable. No mediastinal or hilar masses.  No convincing adenopathy. There is left greater than right lung base opacity mildly improved from the prior study. This may reflect pneumonia, atelectasis or a combination. Minimal left pleural effusion. No  pneumothorax. Remainder of the lungs is clear. IMPRESSION: 1. Mild improvement in left greater than right lung base opacity. Decreased left pleural fluid. Residual lung base opacity is likely atelectasis. Pneumonia is possible. 2. No pulmonary edema.  No new abnormalities. Electronically Signed   By: Lajean Manes M.D.   On: 11/14/2015 09:27   Dg Abd 1 View  Result Date: 11/14/2015 CLINICAL DATA:  Medical hx: cancer, diabetes, hypertension. Patient is lightheaded with limited mobility. EXAM: ABDOMEN - 1 VIEW COMPARISON:  None. FINDINGS: Normal bowel gas pattern. No evidence of renal or ureteral stones. Scattered aortic and iliac artery vascular calcifications. Soft tissues are otherwise unremarkable. No significant bony abnormality. IMPRESSION: No acute findings. Electronically Signed   By: Lajean Manes M.D.   On: 11/14/2015 09:28   Ct Head Wo Contrast  Result Date: 11/13/2015 CLINICAL DATA:  Nausea, vomiting, and diarrhea for 2 weeks. Near syncope. EXAM: CT HEAD WITHOUT CONTRAST TECHNIQUE: Contiguous axial images were obtained from the base of the skull through the vertex without intravenous contrast. COMPARISON:  None. FINDINGS: Brain: No subdural, epidural, or subarachnoid hemorrhage. Ventricles and sulci are normal. Cerebellum, brainstem, and basal cisterns are normal. No acute cortical ischemia or infarct. Vascular: Calcified atherosclerosis is seen in the intracranial portions of the carotid arteries. Skull: Normal. Negative for fracture or focal lesion. Sinuses/Orbits: No acute finding. Other: No other abnormalities. IMPRESSION: No acute intracranial abnormalities. Electronically Signed   By: Dorise Bullion III M.D   On: 11/13/2015 11:56    Procedures Procedures (including critical care time)  Medications Ordered in ED Medications  metroNIDAZOLE (FLAGYL) IVPB 500 mg (500 mg Intravenous Given 11/14/15 1451)  amiodarone (PACERONE) tablet 200 mg (200 mg Oral Given 11/14/15 0959)  insulin  glargine (LANTUS) injection 25 Units (25 Units Subcutaneous Given 11/13/15 2141)  acetaminophen (TYLENOL) tablet 650 mg (650 mg Oral Given 11/14/15 0028)    Or  acetaminophen (TYLENOL) suppository 650 mg ( Rectal See Alternative 11/14/15 0028)  insulin aspart (novoLOG) injection 0-9 Units (0 Units Subcutaneous Not Given 11/14/15 1600)  heparin ADULT infusion 100 units/mL (25000 units/24mL sodium chloride 0.45%) (1,150 Units/hr Intravenous New Bag/Given 11/14/15 1451)  cefTRIAXone (ROCEPHIN) 1 g in dextrose 5 % 50 mL IVPB (1 g Intravenous Given 11/14/15 1644)  0.9 %  sodium chloride infusion ( Intravenous New Bag/Given 11/14/15 0959)  saccharomyces boulardii (FLORASTOR) capsule 250 mg (250 mg Oral Given 11/14/15 1449)  sodium chloride 0.9 % bolus 500 mL (0 mLs Intravenous Stopped 11/13/15 1557)  sodium chloride 0.9 % bolus 500 mL (500 mLs Intravenous Transfusing/Transfer 11/13/15 1603)     Initial Impression / Assessment and Plan / ED Course  I have reviewed the triage vital signs and the nursing notes.  Pertinent labs & imaging results that were available during my care of the patient were reviewed by me and considered in my medical decision making (see chart for  details).  Clinical Course    The patient does not have any discernible reason for his dizziness.  The patient has a negative MRI and his blood testing does not show any significant abnormality.  I advised patient that this still could represent a cardiac issue and he would need further evaluation.  Patient also is advised follow-up with his primary care doctors.  It could represent a medical history vertigo seems a potential bleeding the fact that he has positional symptoms  Final Clinical Impressions(s) / ED Diagnoses   Final diagnoses:  Near syncope  Dizziness  Nausea vomiting and diarrhea    New Prescriptions Current Discharge Medication List       Dalia Heading, PA-C 11/14/15 2007    Leonard Schwartz, MD 11/29/15  [phone]

## 2015-11-14 NOTE — Progress Notes (Signed)
[facility] for Heparin  Indication: atrial fibrillation  Allergies  Allergen Reactions  . No Known Allergies     Patient Measurements: Height: 6' (182.9 cm) Weight: 187 lb 6.3 oz (85 kg) IBW/kg (Calculated) : 77.6 Heparin Dosing Weight: 89.4 kg  Vital Signs: Temp: 99.1 F (37.3 C) (10/01 1951) Temp Source: Oral (10/01 1951) BP: 127/61 (10/01 1951) Pulse Rate: 84 (10/01 1951)  Labs:  Recent Labs  11/13/15 1051  11/13/15 1633 11/13/15 2352 11/14/15 0917 11/14/15 1906  HGB 10.2*  --   --   --  9.5* 9.5*  HCT 31.0*  --   --   --  30.0* 29.9*  PLT 559*  --   --   --  620* 521*  APTT 59*  --   --  142* 108* 144*  HEPARINUNFRC  --   < > >2.20* 1.50* 1.07* 0.87*  CREATININE 1.09  --   --   --  0.92  --   TROPONINI 0.15*  --  0.41* 0.10* 0.12*  --   < > = values in this interval not displayed.  Estimated Creatinine Clearance: 84.3 mL/min (by C-G formula based on SCr of 0.92 mg/dL).  Assessment: 70 y.o. male with h/o Afib, Xarelto on hold, for heparin.  Heparin level this evening is 0.87 and appears to be correlating well with aPTT. RN reports some hematuria.  CBC obtained this evening is essentially the same as it was this morning.   Goal of Therapy:  Heparin level 0.3-0.7 units/ml Monitor platelets by anticoagulation protocol: Yes   Plan:  1. Decrease heparin infusion to 1000 units/hr 2. Heparin level in am  3. Trend CBC   Vincenza Hews, PharmD, BCPS 11/14/2015, 8:40 PM Pager: [phone]

## 2015-11-15 ENCOUNTER — Telehealth: Payer: Self-pay | Admitting: Internal Medicine

## 2015-11-15 DIAGNOSIS — I480 Paroxysmal atrial fibrillation: Secondary | ICD-10-CM | POA: Diagnosis not present

## 2015-11-15 DIAGNOSIS — I951 Orthostatic hypotension: Secondary | ICD-10-CM | POA: Diagnosis not present

## 2015-11-15 LAB — GLUCOSE, CAPILLARY
GLUCOSE-CAPILLARY: 113 mg/dL — AB (ref 65–99)
GLUCOSE-CAPILLARY: 120 mg/dL — AB (ref 65–99)
GLUCOSE-CAPILLARY: 123 mg/dL — AB (ref 65–99)
GLUCOSE-CAPILLARY: 124 mg/dL — AB (ref 65–99)
Glucose-Capillary: 108 mg/dL — ABNORMAL HIGH (ref 65–99)

## 2015-11-15 LAB — BASIC METABOLIC PANEL
Anion gap: 7 (ref 5–15)
CALCIUM: 8.7 mg/dL — AB (ref 8.9–10.3)
CO2: 25 mmol/L (ref 22–32)
CREATININE: 0.78 mg/dL (ref 0.61–1.24)
Chloride: 105 mmol/L (ref 101–111)
GFR calc Af Amer: >60 mL/min (ref >60)
GLUCOSE: 122 mg/dL — AB (ref 65–99)
Potassium: 4.3 mmol/L (ref 3.5–5.1)
Sodium: 137 mmol/L (ref 135–145)

## 2015-11-15 LAB — URINE CULTURE: Culture: >=100000 — AB

## 2015-11-15 LAB — HEPARIN LEVEL (UNFRACTIONATED): HEPARIN UNFRACTIONATED: 0.68 [IU]/mL (ref 0.30–0.70)

## 2015-11-15 LAB — CBC
HCT: 30.7 % — ABNORMAL LOW (ref 39.0–52.0)
Hemoglobin: 9.6 g/dL — ABNORMAL LOW (ref 13.0–17.0)
MCH: 28.1 pg (ref 26.0–34.0)
MCHC: 31.3 g/dL (ref 30.0–36.0)
MCV: 89.8 fL (ref 78.0–100.0)
PLATELETS: 581 10*3/uL — AB (ref 150–400)
RBC: 3.42 MIL/uL — AB (ref 4.22–5.81)
RDW: 14.7 % (ref 11.5–15.5)
WBC: 7.7 10*3/uL (ref 4.0–10.5)

## 2015-11-15 MED ORDER — RIVAROXABAN 20 MG PO TABS
20.0000 mg | ORAL_TABLET | Freq: Every day | ORAL | Status: DC
  Administered 2015-11-15 – 2015-11-19 (×5): 20 mg via ORAL
  Filled 2015-11-15 (×5): qty 1

## 2015-11-15 MED ORDER — INSULIN GLARGINE 100 UNIT/ML ~~LOC~~ SOLN
20.0000 [IU] | Freq: Every day | SUBCUTANEOUS | Status: DC
  Administered 2015-11-16 – 2015-11-19 (×4): 20 [IU] via SUBCUTANEOUS
  Filled 2015-11-15 (×6): qty 0.2

## 2015-11-15 MED ORDER — INSULIN ASPART 100 UNIT/ML ~~LOC~~ SOLN
0.0000 [IU] | Freq: Three times a day (TID) | SUBCUTANEOUS | Status: DC
  Administered 2015-11-16: 2 [IU] via SUBCUTANEOUS
  Administered 2015-11-16: 3 [IU] via SUBCUTANEOUS
  Administered 2015-11-17: 2 [IU] via SUBCUTANEOUS
  Administered 2015-11-17 – 2015-11-18 (×3): 1 [IU] via SUBCUTANEOUS
  Administered 2015-11-18: 3 [IU] via SUBCUTANEOUS
  Administered 2015-11-18: 2 [IU] via SUBCUTANEOUS
  Administered 2015-11-19: 3 [IU] via SUBCUTANEOUS
  Administered 2015-11-19: 2 [IU] via SUBCUTANEOUS
  Administered 2015-11-19: 3 [IU] via SUBCUTANEOUS
  Administered 2015-11-20 (×2): 2 [IU] via SUBCUTANEOUS

## 2015-11-15 NOTE — Progress Notes (Addendum)
[place] for Heparin  Indication: atrial fibrillation   Assessment: 70 y.o. male with h/o Afib, Xarelto on hold, for heparin.  Heparin level this morning is 0.68 no longer checking aptt. CBC has been stable. Hematuria still present per nursing this morning. Noted plan to transition back to xarelto when resolved.   Goal of Therapy:  Heparin level 0.3-0.7 units/ml Monitor platelets by anticoagulation protocol: Yes   Plan:  1. Decrease heparin infusion to 850 units/hr 2. Heparin level in am 3. Trend CBC   Allergies  Allergen Reactions  . No Known Allergies     Patient Measurements: Height: 6' (182.9 cm) Weight: 188 lb 1.6 oz (85.3 kg) IBW/kg (Calculated) : 77.6 Heparin Dosing Weight: 89.4 kg  Vital Signs: Temp: 98.7 F (37.1 C) (10/02 0552) Temp Source: Oral (10/02 0552) BP: 134/67 (10/02 0552) Pulse Rate: 81 (10/02 0552)  Labs:  Recent Labs  11/13/15 1051  11/13/15 1633 11/13/15 2352 11/14/15 0917 11/14/15 1906 11/15/15 0525  HGB 10.2*  --   --   --  9.5* 9.5* 9.6*  HCT 31.0*  --   --   --  30.0* 29.9* 30.7*  PLT 559*  --   --   --  620* 521* 581*  APTT 59*  --   --  142* 108* 144*  --   HEPARINUNFRC  --   < > >2.20* 1.50* 1.07* 0.87* 0.68  CREATININE 1.09  --   --   --  0.92  --  0.78  TROPONINI 0.15*  --  0.41* 0.10* 0.12*  --   --   < > = values in this interval not displayed.  Estimated Creatinine Clearance: 97 mL/min (by C-G formula based on SCr of 0.78 mg/dL).   Erin Hearing PharmD., BCPS Clinical Pharmacist Pager [phone] 11/15/2015 9:01 AM

## 2015-11-15 NOTE — Care Management Note (Signed)
Case Management Note Marvetta Gibbons RN, BSN Unit 2W-Case Manager [phone]  Patient Details  Name: [name] MRN: [id number] Date of Birth: [birth year]  Subjective/Objective:   Pt admitted with syncop, recent discharge s/p CABG and AVR                 Action/Plan: PTA pt lived at home with wife- was active with [facility] for [place]- will need resumption order for [facility] serivces at time of discharge- pt was arranged with a RW on last discharge. CM to follow  Expected Discharge Date:                  Expected Discharge Plan:  [place]  In-House Referral:     Discharge planning Services  CM Consult  Post Acute Care Choice:  [place], Resumption of Svcs/PTA Provider Choice offered to:  Patient, Spouse  DME Arranged:    DME Agency:     HH Arranged:  PT [place]:  [place]  Status of Service:  In process, will continue to follow  If discussed at Long Length of Stay Meetings, dates discussed:    Additional Comments:  Dawayne Patricia, RN 11/15/2015, 10:37 AM

## 2015-11-15 NOTE — Progress Notes (Signed)
Patient Name: [name] Date of Encounter: 11/15/2015  Primary Cardiologist: Dr. 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 No CP. No dizziness. Urine is brown he states. No clots  Inpatient Medications    Scheduled Meds: . amiodarone  200 mg Oral BID  . cefTRIAXone (ROCEPHIN)  IV  1 g Intravenous Q24H  . insulin aspart  0-9 Units Subcutaneous Q4H  . insulin glargine  25 Units Subcutaneous Q2200  . metronidazole  500 mg Intravenous Q8H  . saccharomyces boulardii  250 mg Oral BID   Continuous Infusions: . heparin 1,000 Units/hr (11/14/15 2101)   PRN Meds:.acetaminophen **OR** acetaminophen   Vital Signs    Vitals:   11/14/15 1951 11/14/15 2108 11/14/15 2259 11/15/15 0552  BP: 127/61 139/62  134/67  Pulse: 84 85  81  Resp: 18   18  Temp: 99.1 F (37.3 C) (!) 100.7 F (38.2 C) 99.1 F (37.3 C) 98.7 F (37.1 C)  TempSrc: Oral Oral Oral Oral  SpO2: 97%   95%  Weight:    188 lb 1.6 oz (85.3 kg)  Height:        Intake/Output Summary (Last 24 hours) at 11/15/15 1011 Last data filed at 11/15/15 0559  Gross per 24 hour  Intake           2057.5 ml  Output             1315 ml  Net            742.5 ml   Filed Weights   11/13/15 0944 11/14/15 0503 11/15/15 0552  Weight: 197 lb (89.4 kg) 187 lb 6.3 oz (85 kg) 188 lb 1.6 oz (85.3 kg)    Physical Exam    GEN: Well nourished, well developed, in no acute distress.  HEENT: Grossly normal.  Neck: Supple, no JVD, carotid bruits, or masses. Cardiac:  RRR, no murmurs, rubs, or gallops. No clubbing, cyanosis, trace BLE edema.  Radials/DP/PT 2+ and equal bilaterally. CABG scar Respiratory:  Respirations regular and unlabored, clear to auscultation bilaterally. GI: Soft, nontender, nondistended, BS + x 4. MS: no deformity or atrophy. Skin: warm and dry, no rash. Neuro:  Strength and sensation are intact. Psych: AAOx3.  Normal affect.  Labs    CBC  Recent Labs  11/13/15 1051  11/14/15 1906 11/15/15 0525  WBC 15.5*  < > 7.3 7.7  NEUTROABS 12.9*  --  4.8  --   HGB 10.2*  < > 9.5* 9.6*  HCT 31.0*  < > 29.9* 30.7*  MCV 89.9  < > 90.3 89.8  PLT 559*  < > 521* 581*  < > = values in this interval not displayed. Basic Metabolic Panel  Recent Labs  11/14/15 0917 11/15/15 0525  NA 137 137  K 3.9 4.3  CL 103 105  CO2 25 25  GLUCOSE 138* 122*  BUN 8 <5*  CREATININE 0.92 0.78  CALCIUM 8.5* 8.7*   Liver Function Tests  Recent Labs  11/13/15 1051 11/14/15 0917  AST 36 50*  ALT 37 43  ALKPHOS 95 85  BILITOT 0.9 1.1  PROT 6.9 6.7  ALBUMIN 2.7* 2.7*   No results for input(s): LIPASE, AMYLASE in the last 72 hours. Cardiac Enzymes  Recent Labs  11/13/15 1633 11/13/15 2352 11/14/15 0917  TROPONINI 0.41* 0.10* 0.12*     Telemetry    NSR, PAC's - Personally Reviewed  ECG     SR - Personally Reviewed  Radiology    Dg Chest 2 View  Result Date: 11/14/2015 CLINICAL DATA:  Medical hx: cancer, diabetes, hypertension. Patient is lightheaded with limited mobility. EXAM: CHEST  2 VIEW COMPARISON:  11/06/2015 FINDINGS: The cardiac silhouette is mildly enlarged. Changes from cardiac surgery and valve replacement are stable. No mediastinal or hilar masses.  No convincing adenopathy. There is left greater than right lung base opacity mildly improved from the prior study. This may reflect pneumonia, atelectasis or a combination. Minimal left pleural effusion. No pneumothorax. Remainder of the lungs is clear. IMPRESSION: 1. Mild  improvement in left greater than right lung base opacity. Decreased left pleural fluid. Residual lung base opacity is likely atelectasis. Pneumonia is possible. 2. No pulmonary edema.  No new abnormalities. Electronically Signed   By: Lajean Manes M.D.   On: 11/14/2015 09:27   Dg Abd 1 View  Result Date: 11/14/2015 CLINICAL DATA:  Medical hx: cancer, diabetes, hypertension. Patient is lightheaded with limited mobility. EXAM: ABDOMEN - 1 VIEW COMPARISON:  None. FINDINGS: Normal bowel gas pattern. No evidence of renal or ureteral stones. Scattered aortic and iliac artery vascular calcifications. Soft tissues are otherwise unremarkable. No significant bony abnormality. IMPRESSION: No acute findings. Electronically Signed   By: Lajean Manes M.D.   On: 11/14/2015 09:28   Ct Head Wo Contrast  Result Date: 11/13/2015 CLINICAL DATA:  Nausea, vomiting, and diarrhea for 2 weeks. Near syncope. EXAM: CT HEAD WITHOUT CONTRAST TECHNIQUE: Contiguous axial images were obtained from the base of the skull through the vertex without intravenous contrast. COMPARISON:  None. FINDINGS: Brain: No subdural, epidural, or subarachnoid hemorrhage. Ventricles and sulci are normal. Cerebellum, brainstem, and basal cisterns are normal. No acute cortical ischemia or infarct. Vascular: Calcified atherosclerosis is seen in the intracranial portions of the carotid arteries. Skull: Normal. Negative for fracture or focal lesion. Sinuses/Orbits: No acute finding. Other: No other abnormalities. IMPRESSION: No acute intracranial abnormalities. Electronically Signed   By: Dorise Bullion III M.D   On: 11/13/2015 11:56     Cardiac Studies   ECHO 10/28/15 (prior to CABG/AVR)  - EF normal  Patient Profile     70 year old with CABG/AVR([employer] pericardial tissue valve), PAF, hematuria, hypotension  Assessment & Plan    CABG/AVR/aortic root replacement  - stable, no changes  - LIMA to LAD, SVG to first and second OM, SVG  to RCA.  - Just DC'd a few days ago  Elevated troponin  - 0.41, likely post op/ demand. No signs of ACS  AFIB  - parox  - would change heparin back to [place] (no urinary clots noted)  - AMIO continue  - tele shows NSR with occas PAC's  Hypotension  - agree with holding ARB/Lasix  - IVF  - in setting of N/V/D  Hematuria  - now stating brown urine  - would try to restart Xarelto   Signed, Candee Furbish, MD  11/15/2015, 10:11 AM

## 2015-11-15 NOTE — Telephone Encounter (Signed)
Evelena Peat with [facility] is calling to advise that the patient was hospitalized this weekend at the request of dr gerhart.  SX- nausea, vomiting, diarrhea, dark urine Unresolved since discharge from the hospital.   This is just an [place]

## 2015-11-15 NOTE — Progress Notes (Signed)
PROGRESS NOTE    [name]  [id number] DOB: 07-22-1945 DOA: 11/13/2015 PCP: Drema Pry, DO   Brief Narrative: [name] is a 70 y.o. male with medical history significant for known aortic stenosis was dilated ascending aorta. Preoperative evaluation included cardiac catheterization which revealed multivessel disease apparently asymptomatic. Patient subsequently underwent CABG 4 with tissue aortic valve replacement on 11/01/15. He was discharged on 9/25 and had an uneventful postoperative course except for postoperative atrial fibrillation with RVR. He had previously been on Lopressor and amiodarone was added. He was started on Xarelto. He also has history of diabetes on insulin and metformin, hypertension, dyslipidemia and BPH. Patient reports generalized weakness since discharge and for the past 2 days he has been experiencing nausea and vomiting with watery diarrhea. No blood in the diarrhea or emesis. He's had no abdominal pain. He notified his PCP and went for labs on 9/29. He has had no further bowel movements since 9/29 and was unable to give a stool specimen at the lab. He has not had any fevers or chills. This morning he noticed very dark urine without clots in the urine appeared to have blood in it. Because of weakness and dizziness with a near-syncopal event this morning patient was sent to the ER for further evaluation and treatment. Of note patient continued to take his usual home medications including his Lasix and Xarelto although on 9/29 he did vomit up all of his medications and he has not taken his Xarelto yet today. Of note in addition to the new hematuria wife and patient noticed just today patient was having bright red blood oozing from beneath the Steri-Strips that are covering the previous pacer wire sites in his abdominal area.  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  -Hold offending medications: Lasix, Cozaar -Treat underlying causes -NSL fluids.  -repeat orthostatic.     Nausea vomiting and Diarrhea -Differential includes: Viral gastro-enteritis versus C. difficile colitis (recent hospitalization)  -C diff negative . GI pathogen negative  -Empiric Flagyl -KUB negative for obstruction.  -advance diet    UTI ;  Hematuria, gross/Abn UA -resume xarelto/  -follow urine culture/. Gram negative rods.  -continue with ceftriaxone./     Dehydration with hyponatremia -Secondary to above issues -improved.     Type II diabetes mellitus with neurological manifestations, uncontrolled  -Current CBG greater than 150 -Hemoglobin previous admission elevated at 11.2 -Hold metformin during acute illness -decrease lantus, not eating enough.  -Follow CBGs and provide SS I     Essential hypertension -Antihypertensive medications on hold as above secondary to orthostasis    Atrial fibrillation (post op after CABG Sept 2017) -Currently rate controlled and was standing then becomes tachycardic -Continue amiodarone -resume xarelto, discontinue heparin.  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  Also cardiology per CVTS request        DVT prophylaxis: heparin  Code Status: Full code.  Family Communication: wife at bedside.  Disposition Plan: home when stable.    Consultants:   Cardiology  CVTS  Procedures:none   Antimicrobials: ceftriaxone, flagyl.    Subjective: Feeling better, willing to try food.  Had less watery stool yesterday   Objective: Vitals:   11/14/15 2108 11/14/15 2259 11/15/15 0552 11/15/15 1350  BP: 139/62  134/67 (!) 143/68  Pulse: 85  81 90  Resp:   18 17  Temp: (!) 100.7 F (38.2 C) 99.1 F (37.3 C) 98.7 F (37.1 C) 98.1 F (36.7 C)  TempSrc: Oral Oral Oral Oral  SpO2:   95% 98%  Weight:   85.3 kg (188 lb 1.6 oz)   Height:        Intake/Output Summary (Last 24 hours) at 11/15/15 1519 Last data filed at 11/15/15 1300  Gross per 24 hour  Intake           2057.5 ml  Output             1515 ml  Net            542.5 ml   Filed Weights   11/13/15 0944 11/14/15 0503 11/15/15 0552  Weight: 89.4 kg (197 lb) 85 kg (187 lb 6.3 oz) 85.3 kg (188 lb 1.6 oz)    Examination:  General exam: Appears calm and comfortable  Respiratory system: Clear to auscultation. Respiratory effort normal. Cardiovascular system: S1 & S2 heard, RRR. No JVD, murmurs, rubs, gallops or clicks. No pedal edema. Gastrointestinal system: Abdomen is nondistended, soft and nontender. No organomegaly or masses felt. Normal bowel sounds heard. Central nervous system: Alert and oriented. No focal neurological deficits. Extremities: Symmetric 5 x 5 power. Skin: No rashes, lesions or ulcers Psychiatry: Judgement and insight appear normal. Mood & affect appropriate.     Data Reviewed: I have personally reviewed following labs and imaging studies  CBC:  Recent Labs Lab 11/13/15 1051 11/14/15 0917 11/14/15 1906 11/15/15 0525  WBC 15.5* 9.4 7.3 7.7  NEUTROABS 12.9*  --  4.8  --   HGB 10.2* 9.5* 9.5* 9.6*  HCT 31.0* 30.0* 29.9* 30.7*  MCV  89.9 91.2 90.3 89.8  PLT 559* 620* 521* [id number]*   Basic Metabolic Panel:  Recent Labs Lab 11/13/15 1051 11/14/15 0917 11/15/15 0525  NA 132* 137 137  K 4.4 3.9 4.3  CL 95* 103 105  CO2 29 25 25   GLUCOSE 190* 138* 122*  BUN 16 8 <5*  CREATININE 1.09 0.92 0.78  CALCIUM 8.6* 8.5* 8.7*   GFR: Estimated Creatinine Clearance: 97 mL/min (by C-G formula based on SCr of 0.78 mg/dL). Liver Function Tests:  Recent Labs Lab 11/13/15 1051 11/14/15 0917  AST 36 50*  ALT 37 43  ALKPHOS 95 85  BILITOT 0.9 1.1  PROT 6.9 6.7  ALBUMIN 2.7* 2.7*   No results for input(s): LIPASE, AMYLASE in the last 168 hours. No results for input(s): AMMONIA in the last 168 hours. Coagulation Profile: No results for input(s): INR, PROTIME in the last 168 hours. Cardiac Enzymes:  Recent Labs Lab 11/13/15 1051 11/13/15 1633 11/13/15 2352 11/14/15 0917  TROPONINI 0.15* 0.41* 0.10* 0.12*   BNP (last 3 results) No results for input(s): PROBNP in the  last 8760 hours. HbA1C: No results for input(s): HGBA1C in the last 72 hours. CBG:  Recent Labs Lab 11/14/15 1601 11/14/15 1948 11/15/15 0019 11/15/15 0805 11/15/15 1126  GLUCAP 147* 163* 123* 124* 120*   Lipid Profile: No results for input(s): CHOL, HDL, LDLCALC, TRIG, CHOLHDL, LDLDIRECT in the last 72 hours. Thyroid Function Tests: No results for input(s): TSH, T4TOTAL, FREET4, T3FREE, THYROIDAB in the last 72 hours. Anemia Panel: No results for input(s): VITAMINB12, FOLATE, FERRITIN, TIBC, IRON, RETICCTPCT in the last 72 hours. Sepsis Labs:  Recent Labs Lab 11/13/15 1428 11/13/15 1442 11/13/15 1915  PROCALCITON 0.39  --   --   LATICACIDVEN  --  1.2 1.3    Recent Results (from the past 240 hour(s))  Urine culture     Status: Abnormal   Collection Time: 11/13/15  2:17 PM  Result Value Ref Range Status   Specimen Description URINE, RANDOM  Final   Special Requests NONE  Final   Culture >=100,000 COLONIES/mL KLEBSIELLA PNEUMONIAE  (A)  Final   Report Status 11/15/2015 FINAL  Final   Organism ID, Bacteria KLEBSIELLA PNEUMONIAE (A)  Final      Susceptibility   Klebsiella pneumoniae - MIC*    AMPICILLIN >=32 RESISTANT Resistant     CEFAZOLIN <=4 SENSITIVE Sensitive     CEFTRIAXONE <=1 SENSITIVE Sensitive     CIPROFLOXACIN <=0.25 SENSITIVE Sensitive     GENTAMICIN <=1 SENSITIVE Sensitive     IMIPENEM <=0.25 SENSITIVE Sensitive     NITROFURANTOIN 32 SENSITIVE Sensitive     TRIMETH/SULFA <=20 SENSITIVE Sensitive     AMPICILLIN/SULBACTAM 4 SENSITIVE Sensitive     PIP/TAZO <=4 SENSITIVE Sensitive     Extended ESBL NEGATIVE Sensitive     * >=100,000 COLONIES/mL KLEBSIELLA PNEUMONIAE  C difficile quick scan w PCR reflex     Status: None   Collection Time: 11/13/15  2:25 PM  Result Value Ref Range Status   C Diff antigen NEGATIVE NEGATIVE Final   C Diff toxin NEGATIVE NEGATIVE Final   C Diff interpretation No C. difficile detected.  Final  Gastrointestinal Panel by PCR , Stool     Status: None   Collection Time: 11/13/15  2:25 PM  Result Value Ref Range Status   Campylobacter species NOT DETECTED NOT DETECTED Final   Plesimonas shigelloides NOT DETECTED NOT DETECTED Final   Salmonella species NOT DETECTED NOT DETECTED Final   Yersinia enterocolitica NOT DETECTED NOT DETECTED Final   Vibrio species NOT DETECTED NOT DETECTED Final   Vibrio cholerae NOT DETECTED NOT DETECTED Final   Enteroaggregative E coli (EAEC) NOT DETECTED NOT DETECTED Final   Enteropathogenic E coli (EPEC) NOT DETECTED NOT DETECTED Final   Enterotoxigenic E coli (ETEC) NOT DETECTED NOT DETECTED Final   Shiga like toxin producing E coli (STEC) NOT DETECTED NOT DETECTED Final   Shigella/Enteroinvasive E coli (EIEC) NOT DETECTED NOT DETECTED Final   Cryptosporidium NOT DETECTED NOT DETECTED Final   Cyclospora cayetanensis NOT DETECTED NOT DETECTED Final   Entamoeba histolytica NOT DETECTED NOT DETECTED Final   Giardia lamblia NOT DETECTED NOT  DETECTED Final   Adenovirus F40/41 NOT DETECTED NOT DETECTED Final   Astrovirus NOT DETECTED NOT DETECTED Final   Norovirus GI/GII NOT DETECTED NOT DETECTED Final   Rotavirus A NOT DETECTED NOT DETECTED Final   Sapovirus (I, II, IV, and V) NOT DETECTED NOT DETECTED Final  Culture, blood (Routine X 2) w Reflex to ID Panel     Status: None (Preliminary result)  Collection Time: 11/13/15  4:35 PM  Result Value Ref Range Status   Specimen Description BLOOD BLOOD RIGHT HAND  Final   Special Requests IN PEDIATRIC BOTTLE 2CC  Final   Culture NO GROWTH < 24 HOURS  Final   Report Status PENDING  Incomplete  Culture, blood (Routine X 2) w Reflex to ID Panel     Status: None (Preliminary result)   Collection Time: 11/13/15  4:49 PM  Result Value Ref Range Status   Specimen Description BLOOD RIGHT ANTECUBITAL  Final   Special Requests IN PEDIATRIC BOTTLE 2CC  Final   Culture NO GROWTH < 24 HOURS  Final   Report Status PENDING  Incomplete         Radiology Studies: Dg Chest 2 View  Result Date: 11/14/2015 CLINICAL DATA:  Medical hx: cancer, diabetes, hypertension. Patient is lightheaded with limited mobility. EXAM: CHEST  2 VIEW COMPARISON:  11/06/2015 FINDINGS: The cardiac silhouette is mildly enlarged. Changes from cardiac surgery and valve replacement are stable. No mediastinal or hilar masses.  No convincing adenopathy. There is left greater than right lung base opacity mildly improved from the prior study. This may reflect pneumonia, atelectasis or a combination. Minimal left pleural effusion. No pneumothorax. Remainder of the lungs is clear. IMPRESSION: 1. Mild improvement in left greater than right lung base opacity. Decreased left pleural fluid. Residual lung base opacity is likely atelectasis. Pneumonia is possible. 2. No pulmonary edema.  No new abnormalities. Electronically Signed   By: Lajean Manes M.D.   On: 11/14/2015 09:27   Dg Abd 1 View  Result Date: 11/14/2015 CLINICAL DATA:   Medical hx: cancer, diabetes, hypertension. Patient is lightheaded with limited mobility. EXAM: ABDOMEN - 1 VIEW COMPARISON:  None. FINDINGS: Normal bowel gas pattern. No evidence of renal or ureteral stones. Scattered aortic and iliac artery vascular calcifications. Soft tissues are otherwise unremarkable. No significant bony abnormality. IMPRESSION: No acute findings. Electronically Signed   By: Lajean Manes M.D.   On: 11/14/2015 09:28        Scheduled Meds: . amiodarone  200 mg Oral BID  . cefTRIAXone (ROCEPHIN)  IV  1 g Intravenous Q24H  . insulin aspart  0-9 Units Subcutaneous TID WC  . insulin glargine  25 Units Subcutaneous Q2200  . metronidazole  500 mg Intravenous Q8H  . saccharomyces boulardii  250 mg Oral BID   Continuous Infusions:     LOS: 1 day    Time spent: 35 minutes.     Elmarie Shiley, MD [facility] Pager [phone]  If 7PM-7AM, please contact night-coverage [email or web address] Password [email or web address] 11/15/2015, 3:19 PM

## 2015-11-15 NOTE — Progress Notes (Addendum)
      [name] [address]       [place] [address]             [phone]            Subjective: Patient states no more nausea or vomiting. Still with loose stools and "burgundy" urine.  Objective: Vital signs in last 24 hours: Temp:  [98.7 F (37.1 C)-100.7 F (38.2 C)] 98.7 F (37.1 C) (10/02 0552) Pulse Rate:  [81-85] 81 (10/02 0552) Cardiac Rhythm: Normal sinus rhythm (10/02 0700) Resp:  [18] 18 (10/02 0552) BP: (127-139)/(61-67) 134/67 (10/02 0552) SpO2:  [95 %-97 %] 95 % (10/02 0552) Weight:  [188 lb 1.6 oz (85.3 kg)] 188 lb 1.6 oz (85.3 kg) (10/02 0552)   Current Weight  11/15/15 188 lb 1.6 oz (85.3 kg)      Intake/Output from previous day: 10/01 0701 - 10/02 0700 In: 2417.5 [P.O.:1080; I.V.:1237.5; IV Piggyback:100] Out: 1315 [Urine:1315]   Physical Exam:  Cardiovascular: RRR, murmur Pulmonary: Mostly clear to auscultation bilaterally Abdomen: Soft, non tender, bowel sounds present. Extremities: Mild bilateral lower extremity edema. Wounds: Clean and dry.  No erythema or signs of infection.  Lab Results: CBC: Recent Labs  11/14/15 1906 11/15/15 0525  WBC 7.3 7.7  HGB 9.5* 9.6*  HCT 29.9* 30.7*  PLT 521* 581*   BMET:  Recent Labs  11/14/15 0917 11/15/15 0525  NA 137 137  K 3.9 4.3  CL 103 105  CO2 25 25  GLUCOSE 138* 122*  BUN 8 <5*  CREATININE 0.92 0.78  CALCIUM 8.5* 8.7*    PT/INR:  Lab Results  Component Value Date   INR 1.94 10/29/2015   INR 1.12 10/28/2015   INR 1.1 10/25/2015   ABG:  INR: Will add last result for INR, ABG once components are confirmed Will add last 4 CBG results once components are confirmed  Assessment/Plan:  1. CV - History of a fib. SR some PACs in the 80's. On Amiodarone 200 mg bid.  On Heparin drip currently. Cardiology recommends restarting Xarelto. Await echo results.  2.  ID-on Ceftriaxone and Flagyl. UC and BC thus far shows no growth. 3.  Acute blood loss anemia - H and H stable  at 9.6 and 30.7. Continues with hematuria/darkened urine. 4. DM-CBGs 123/124/120. On Insulin. 5. Management per medicine  ZIMMERMAN,DONIELLE MPA-C 11/15/2015,1:43 PM  Requested bladder scan this am, but not reliably done this am. Will repeat tonight  ECHO ordered this am still pending Patient and family not pleased with care, notes he had to pee in coffe cup last night because no one came when he called  I have seen and examined [name] and agree with the above assessment  and plan.  [name] MD [facility] [phone] Office [phone] 11/15/2015 7:52 PM

## 2015-11-16 ENCOUNTER — Inpatient Hospital Stay (HOSPITAL_COMMUNITY): Payer: Medicare Other

## 2015-11-16 DIAGNOSIS — I951 Orthostatic hypotension: Secondary | ICD-10-CM | POA: Diagnosis not present

## 2015-11-16 DIAGNOSIS — R55 Syncope and collapse: Secondary | ICD-10-CM | POA: Diagnosis not present

## 2015-11-16 DIAGNOSIS — I480 Paroxysmal atrial fibrillation: Secondary | ICD-10-CM | POA: Diagnosis not present

## 2015-11-16 DIAGNOSIS — Z951 Presence of aortocoronary bypass graft: Secondary | ICD-10-CM | POA: Diagnosis not present

## 2015-11-16 DIAGNOSIS — R748 Abnormal levels of other serum enzymes: Secondary | ICD-10-CM | POA: Diagnosis not present

## 2015-11-16 LAB — CBC
HCT: 30.8 % — ABNORMAL LOW (ref 39.0–52.0)
Hemoglobin: 9.8 g/dL — ABNORMAL LOW (ref 13.0–17.0)
MCH: 28.4 pg (ref 26.0–34.0)
MCHC: 31.8 g/dL (ref 30.0–36.0)
MCV: 89.3 fL (ref 78.0–100.0)
PLATELETS: 581 10*3/uL — AB (ref 150–400)
RBC: 3.45 MIL/uL — ABNORMAL LOW (ref 4.22–5.81)
RDW: 14.4 % (ref 11.5–15.5)
WBC: 9 10*3/uL (ref 4.0–10.5)

## 2015-11-16 LAB — BASIC METABOLIC PANEL
Anion gap: 8 (ref 5–15)
CALCIUM: 8.7 mg/dL — AB (ref 8.9–10.3)
CO2: 27 mmol/L (ref 22–32)
Chloride: 99 mmol/L — ABNORMAL LOW (ref 101–111)
Creatinine, Ser: 0.77 mg/dL (ref 0.61–1.24)
GFR calc Af Amer: >60 mL/min (ref >60)
GLUCOSE: 119 mg/dL — AB (ref 65–99)
Potassium: 4 mmol/L (ref 3.5–5.1)
Sodium: 134 mmol/L — ABNORMAL LOW (ref 135–145)

## 2015-11-16 LAB — GLUCOSE, CAPILLARY
GLUCOSE-CAPILLARY: 225 mg/dL — AB (ref 65–99)
GLUCOSE-CAPILLARY: 160 mg/dL — AB (ref 65–99)
Glucose-Capillary: 113 mg/dL — ABNORMAL HIGH (ref 65–99)
Glucose-Capillary: 169 mg/dL — ABNORMAL HIGH (ref 65–99)

## 2015-11-16 LAB — ECHOCARDIOGRAM COMPLETE
Height: 72 in
Weight: 2924.18 oz

## 2015-11-16 MED ORDER — METRONIDAZOLE 500 MG PO TABS
500.0000 mg | ORAL_TABLET | Freq: Three times a day (TID) | ORAL | Status: DC
  Administered 2015-11-16 – 2015-11-17 (×3): 500 mg via ORAL
  Filled 2015-11-16 (×3): qty 1

## 2015-11-16 MED ORDER — CEPHALEXIN 500 MG PO CAPS
500.0000 mg | ORAL_CAPSULE | Freq: Two times a day (BID) | ORAL | Status: AC
  Administered 2015-11-16 – 2015-11-18 (×6): 500 mg via ORAL
  Filled 2015-11-16 (×6): qty 1

## 2015-11-16 NOTE — Progress Notes (Signed)
CARDIAC REHAB PHASE I   PRE:  Rate/Rhythm: 101 ST   BP:  Supine:   Sitting: 115/57  Standing:    SaO2: 96%RA  MODE:  Ambulation: 100 ft   POST:  Rate/Rhythm: 148 ?afib  102 ST with rest  BP:  Supine:   Sitting: 90/62  Standing:    SaO2: 96%RA 1415-1440 Pt willing to walk. Pt walked 100 ft on RA with gait belt use, rolling walker and asst x 1 and started to feel weak. One asst to follow with rollator so pt could sit. ? Atrial fib. Heart rate elevated and BP lower. Rolled pt back to room and to recliner. When pt sitting and resting, he is in [place].   Graylon Good, RN BSN  11/16/2015 2:34 PM

## 2015-11-16 NOTE — Evaluation (Signed)
Physical Therapy Evaluation/ Discharge Patient Details Name: HERO KULISH MRN: [id number] DOB: 10-21-1945 Today's Date: 11/16/2015   History of Present Illness  Mr. Seehafer admitted with N/V/D and syncope 9/30. 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  Clinical Impression  Mr.Niemann is familiar from prior admission. Pt reports he has been doing well at home although ambulation remains limited and wife has been reminding him to perform his incentive spirometer. Pt reports HHPT was initiated but only arrived for one visit. Pt able to state all precautions and moving as well as he was at last D/C and home. Pt with decreased activity tolerance due to Afib. Pt does not require acute P.T. At this time and recommend continuation of HHPT as well as cardiac rehab acutely. Pt aware and agreeable to this plan.   BP 120/59 sitting, MAP 74, HR 104 Standing 92/67, MAP 72, HR 110 3 min standing 100/64, MAP 69, HR 115 After gait with initiation of Afib and dizziness 103/57, MAP 69, HR 145 sats 93-95% on RA throughout     Follow Up Recommendations Home health PT;Supervision/Assistance - 24 hour    Equipment Recommendations  None recommended by PT    Recommendations for Other Services       Precautions / Restrictions Precautions Precautions: Sternal;Fall Precaution Comments: watch rate and sats Restrictions Weight Bearing Restrictions: Yes (sternal precautions)      Mobility  Bed Mobility Overal bed mobility: Needs Assistance Bed Mobility: Sit to Supine       Sit to supine: Min assist   General bed mobility comments: assist to elevate legs onto bed  Transfers Overall transfer level: Modified independent               General transfer comment: pt continues to  hug heart pillow and use rocking momentum to stand. initial posterior force with bil LE against chair to achieve standing and initial balance  Ambulation/Gait Ambulation/Gait assistance: Supervision Ambulation Distance (Feet): 225 Feet Assistive device: Rolling walker (2 wheeled) Gait Pattern/deviations: Step-through pattern;Decreased stride length   Gait velocity interpretation: Below normal speed for age/gender General Gait Details: pt with slow cautious gait with pt initially with HR 103, Pt with elevated HR to 145 with Afib and pt reporting dizziness at which time pt assisted to chair and wheeled back to room  Stairs            Wheelchair Mobility    Modified Rankin (Stroke Patients Only)       Balance                                             Pertinent Vitals/Pain Pain Assessment: No/denies pain    Home Living Family/patient expects to be discharged to:: Private residence Living Arrangements: Spouse/significant other;Children Available Help at Discharge: Family;Available 24 hours/day Type of Home: House Home Access: Level entry     Home Layout: Two level;Bed/bath upstairs;1/2 bath on main level Home Equipment: Walker - 2 wheels      Prior Function Level of Independence: Independent               Hand Dominance        Extremity/Trunk Assessment   Upper Extremity Assessment: Overall WFL for  tasks assessed           Lower Extremity Assessment: Overall WFL for tasks assessed      Cervical / Trunk Assessment: Kyphotic  Communication   Communication: No difficulties  Cognition Arousal/Alertness: Awake/alert Behavior During Therapy: WFL for tasks assessed/performed Overall Cognitive Status: Within Functional Limits for tasks assessed                      General Comments      Exercises     Assessment/Plan    PT Assessment All further PT needs can be met in the next venue of care  PT Problem List             PT Treatment Interventions      PT Goals (Current goals can be found in the Care Plan section)  Acute Rehab PT Goals Patient Stated Goal: go home PT Goal Formulation: All assessment and education complete, DC therapy    Frequency     Barriers to discharge        Co-evaluation               End of Session Equipment Utilized During Treatment: Gait belt Activity Tolerance: Patient tolerated treatment well Patient left: in bed;with call bell/phone within reach Nurse Communication: Mobility status;Precautions         Time: 5844-1712 PT Time Calculation (min) (ACUTE ONLY): 28 min   Charges:   PT Evaluation $PT Eval Moderate Complexity: 1 Procedure     PT G CodesMelford Aase 11/16/2015, 12:26 PM  Elwyn Reach, [place]

## 2015-11-16 NOTE — Telephone Encounter (Signed)
Per Rachel Bo, patient will be following up with surgeon about symptoms. Advised to contact office if unable to see someone at their office.

## 2015-11-16 NOTE — Progress Notes (Signed)
Echocardiogram 2D Echocardiogram has been performed.  [name] 11/16/2015, 2:11 PM

## 2015-11-16 NOTE — Progress Notes (Addendum)
CARDIAC REHAB PHASE I   Went to ambulate with pt, pt HR 150s a fib. RN at bedside, will follow as x2 later today (due to symtomatic orthostasis) as schedule/HR permit.   Lenna Sciara, RN, BSN 11/16/2015 9:17 AM

## 2015-11-16 NOTE — Progress Notes (Signed)
PROGRESS NOTE    [name]  [id number] DOB: 03/17/1945 DOA: 11/13/2015 PCP: Drema Pry, DO   Brief Narrative: [name] is a 70 y.o. male with medical history significant for known aortic stenosis was dilated ascending aorta. Preoperative evaluation included cardiac catheterization which revealed multivessel disease apparently asymptomatic. Patient subsequently underwent CABG 4 with tissue aortic valve replacement on 11/01/15. He was discharged on 9/25 and had an uneventful postoperative course except for postoperative atrial fibrillation with RVR. He had previously been on Lopressor and amiodarone was added. He was started on Xarelto. He also has history of diabetes on insulin and metformin, hypertension, dyslipidemia and BPH. Patient reports generalized weakness since discharge and for the past 2 days he has been experiencing nausea and vomiting with watery diarrhea. No blood in the diarrhea or emesis. He's had no abdominal pain. He notified his PCP and went for labs on 9/29. He has had no further bowel movements since 9/29 and was unable to give a stool specimen at the lab. He has not had any fevers or chills. This morning he noticed very dark urine without clots in the urine appeared to have blood in it. Because of weakness and dizziness with a near-syncopal event this morning patient was sent to the ER for further evaluation and treatment. Of note patient continued to take his usual home medications including his Lasix and Xarelto although on 9/29 he did vomit up all of his medications and he has not taken his Xarelto yet today. Of note in addition to the new hematuria wife and patient noticed just today patient was having bright red blood oozing from beneath the Steri-Strips that are covering the previous pacer wire sites in his abdominal area.  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  -Hold offending medications: Lasix, Cozaar -Treat underlying causes -NSL fluids to avoid volume overload.  -ECHO ordered due to recent CABG and valve replacement.     Nausea vomiting and Diarrhea -Differential includes: Viral gastro-enteritis versus C. difficile colitis (recent hospitalization)  -C diff negative . GI pathogen negative  -Empiric Flagyl -KUB negative for obstruction.  -advance diet  -improving.   UTI ;  Hematuria, gross/Abn UA -resume xarelto/  -follow urine culture/. Growing klebsiella sensitive to ceftriaxone, cefazolin.  -Received ceftriaxone for 4 days. Will change to keflex for 5 days treatment,      Dehydration with hyponatremia -Secondary to above issues -improved.     Type II diabetes mellitus with neurological manifestations, uncontrolled  -Current CBG greater than 150 -Hemoglobin previous admission elevated at 11.2 -Hold metformin during acute illness -decrease lantus, not eating enough.  -Follow CBGs and provide SS I     Essential hypertension -Antihypertensive medications on hold as above secondary to orthostasis    Atrial fibrillation (post op after CABG Sept 2017) -Currently rate controlled and was standing then becomes tachycardic -Continue amiodarone -resume xarelto, discontinue heparin 10-02.  -CHADVASc = 4    Elevated  troponin -Mild elevation presumed secondary to recent CABG procedure as well as demand ischemia from symptomatic orthostasis      Hyperlipidemia -Hold preadmission statin until resumed on solid diet    Aortic stenosis s/p tissue valve (Sept 2017)/Hx of CABG (Sept 2017) -See above CVTS informed of patient admission.  Also cardiology per CVTS request        DVT prophylaxis: heparin  Code Status: Full code.  Family Communication: care discussed with patient  Disposition Plan: home when stable. Home in 1 or 2 days. Follow ECHO    Consultants:   Cardiology  CVTS  Procedures:none   Antimicrobials: ceftriaxone, flagyl.    Subjective: He is feeling better, diarrhea resolved, starting to eat more.   Objective: Vitals:   11/16/15 0300 11/16/15 0500 11/16/15 0915 11/16/15 1048  BP: 136/70  (!) 94/53   Pulse: 91   (!) 145  Resp: 18     Temp:      TempSrc: Oral     SpO2: 98%     Weight:  82.9 kg (182 lb 12.2 oz)    Height:        Intake/Output Summary (Last 24 hours) at 11/16/15 1340 Last data filed at 11/16/15 0730  Gross per 24 hour  Intake              240 ml  Output             2550 ml  Net            -2310 ml   Filed Weights   11/14/15 0503 11/15/15 0552 11/16/15 0500  Weight: 85 kg (187 lb 6.3 oz) 85.3 kg (188 lb 1.6 oz) 82.9 kg (182 lb 12.2 oz)    Examination:  General exam: Appears calm and comfortable  Respiratory system: Clear to auscultation. Respiratory effort normal. Cardiovascular system: S1 & S2 heard, RRR. No JVD, murmurs, rubs, gallops or clicks. No pedal edema. Gastrointestinal system: Abdomen is nondistended, soft and nontender. No organomegaly or masses felt. Normal bowel sounds heard. Central nervous system: Alert and oriented. No focal neurological deficits. Extremities: Symmetric 5 x 5 power. Skin: No rashes, lesions or ulcers Psychiatry: Judgement and insight appear normal. Mood & affect appropriate.     Data Reviewed: I have personally reviewed following labs and imaging studies  CBC:  Recent Labs Lab 11/13/15 1051 11/14/15 0917 11/14/15 1906  11/15/15 0525 11/16/15 0149  WBC 15.5* 9.4 7.3 7.7 9.0  NEUTROABS 12.9*  --  4.8  --   --   HGB 10.2* 9.5* 9.5* 9.6* 9.8*  HCT 31.0* 30.0* 29.9* 30.7* 30.8*  MCV 89.9 91.2 90.3 89.8 89.3  PLT 559* 620* 521* 581* [id number]*   Basic Metabolic Panel:  Recent Labs Lab 11/13/15 1051 11/14/15 0917 11/15/15 0525 11/16/15 0149  NA 132* 137 137 134*  K 4.4 3.9 4.3 4.0  CL 95* 103 105 99*  CO2 29 25 25 27   GLUCOSE 190* 138* 122* 119*  BUN 16 8 <5* <5*  CREATININE 1.09 0.92 0.78 0.77  CALCIUM 8.6* 8.5* 8.7* 8.7*   GFR: Estimated Creatinine Clearance: 97 mL/min (by C-G formula based on SCr of 0.77 mg/dL). Liver Function Tests:  Recent Labs Lab 11/13/15 1051 11/14/15 0917  AST 36 50*  ALT 37 43  ALKPHOS 95 85  BILITOT 0.9 1.1  PROT 6.9 6.7  ALBUMIN 2.7* 2.7*   No results for input(s): LIPASE, AMYLASE in the last 168 hours. No results for input(s): AMMONIA in the last 168  hours. Coagulation Profile: No results for input(s): INR, PROTIME in the last 168 hours. Cardiac Enzymes:  Recent Labs Lab 11/13/15 1051 11/13/15 1633 11/13/15 2352 11/14/15 0917  TROPONINI 0.15* 0.41* 0.10* 0.12*   BNP (last 3 results) No results for input(s): PROBNP in the last 8760 hours. HbA1C: No results for input(s): HGBA1C in the last 72 hours. CBG:  Recent Labs Lab 11/15/15 1126 11/15/15 1619 11/15/15 2046 11/16/15 0627 11/16/15 1121  GLUCAP 120* 113* 108* 113* 169*   Lipid Profile: No results for input(s): CHOL, HDL, LDLCALC, TRIG, CHOLHDL, LDLDIRECT in the last 72 hours. Thyroid Function Tests: No results for input(s): TSH, T4TOTAL, FREET4, T3FREE, THYROIDAB in the last 72 hours. Anemia Panel: No results for input(s): VITAMINB12, FOLATE, FERRITIN, TIBC, IRON, RETICCTPCT in the last 72 hours. Sepsis Labs:  Recent Labs Lab 11/13/15 1428 11/13/15 1442 11/13/15 1915  PROCALCITON 0.39  --   --   LATICACIDVEN  --  1.2 1.3    Recent Results (from the past 240 hour(s))  Urine  culture     Status: Abnormal   Collection Time: 11/13/15  2:17 PM  Result Value Ref Range Status   Specimen Description URINE, RANDOM  Final   Special Requests NONE  Final   Culture >=100,000 COLONIES/mL KLEBSIELLA PNEUMONIAE (A)  Final   Report Status 11/15/2015 FINAL  Final   Organism ID, Bacteria KLEBSIELLA PNEUMONIAE (A)  Final      Susceptibility   Klebsiella pneumoniae - MIC*    AMPICILLIN >=32 RESISTANT Resistant     CEFAZOLIN <=4 SENSITIVE Sensitive     CEFTRIAXONE <=1 SENSITIVE Sensitive     CIPROFLOXACIN <=0.25 SENSITIVE Sensitive     GENTAMICIN <=1 SENSITIVE Sensitive     IMIPENEM <=0.25 SENSITIVE Sensitive     NITROFURANTOIN 32 SENSITIVE Sensitive     TRIMETH/SULFA <=20 SENSITIVE Sensitive     AMPICILLIN/SULBACTAM 4 SENSITIVE Sensitive     PIP/TAZO <=4 SENSITIVE Sensitive     Extended ESBL NEGATIVE Sensitive     * >=100,000 COLONIES/mL KLEBSIELLA PNEUMONIAE  C difficile quick scan w PCR reflex     Status: None   Collection Time: 11/13/15  2:25 PM  Result Value Ref Range Status   C Diff antigen NEGATIVE NEGATIVE Final   C Diff toxin NEGATIVE NEGATIVE Final   C Diff interpretation No C. difficile detected.  Final  Gastrointestinal Panel by PCR , Stool     Status: None   Collection Time: 11/13/15  2:25 PM  Result Value Ref Range Status   Campylobacter species NOT DETECTED NOT DETECTED Final   Plesimonas shigelloides NOT DETECTED NOT DETECTED Final   Salmonella species NOT DETECTED NOT DETECTED Final   Yersinia enterocolitica NOT DETECTED NOT DETECTED Final   Vibrio species NOT DETECTED NOT DETECTED Final   Vibrio cholerae NOT DETECTED NOT DETECTED Final   Enteroaggregative E coli (EAEC) NOT DETECTED NOT DETECTED Final   Enteropathogenic E coli (EPEC) NOT DETECTED NOT DETECTED Final   Enterotoxigenic E coli (ETEC) NOT DETECTED NOT DETECTED Final   Shiga like toxin producing E coli (STEC) NOT DETECTED NOT DETECTED Final   Shigella/Enteroinvasive E coli (EIEC) NOT  DETECTED NOT DETECTED Final   Cryptosporidium NOT DETECTED NOT DETECTED Final   Cyclospora cayetanensis NOT DETECTED NOT DETECTED Final   Entamoeba histolytica NOT DETECTED NOT DETECTED Final   Giardia lamblia NOT DETECTED NOT DETECTED Final   Adenovirus F40/41 NOT DETECTED NOT DETECTED Final   Astrovirus NOT DETECTED NOT DETECTED Final   Norovirus GI/GII  NOT DETECTED NOT DETECTED Final   Rotavirus A NOT DETECTED NOT DETECTED Final   Sapovirus (I, II, IV, and V) NOT DETECTED NOT DETECTED Final  Culture, blood (Routine X 2) w Reflex to ID Panel     Status: None (Preliminary result)   Collection Time: 11/13/15  4:35 PM  Result Value Ref Range Status   Specimen Description BLOOD BLOOD RIGHT HAND  Final   Special Requests IN PEDIATRIC BOTTLE 2CC  Final   Culture NO GROWTH 2 DAYS  Final   Report Status PENDING  Incomplete  Culture, blood (Routine X 2) w Reflex to ID Panel     Status: None (Preliminary result)   Collection Time: 11/13/15  4:49 PM  Result Value Ref Range Status   Specimen Description BLOOD RIGHT ANTECUBITAL  Final   Special Requests IN PEDIATRIC BOTTLE 2CC  Final   Culture NO GROWTH 2 DAYS  Final   Report Status PENDING  Incomplete         Radiology Studies: No results found.      Scheduled Meds: . amiodarone  200 mg Oral BID  . cefTRIAXone (ROCEPHIN)  IV  1 g Intravenous Q24H  . insulin aspart  0-9 Units Subcutaneous TID WC  . insulin glargine  20 Units Subcutaneous Q2200  . metroNIDAZOLE  500 mg Oral Q8H  . rivaroxaban  20 mg Oral Q supper  . saccharomyces boulardii  250 mg Oral BID   Continuous Infusions:     LOS: 2 days    Time spent: 35 minutes.     Elmarie Shiley, MD [facility] Pager [phone]  If 7PM-7AM, please contact night-coverage [email or web address] Password [email or web address] 11/16/2015, 1:40 PM

## 2015-11-16 NOTE — Progress Notes (Signed)
Patient ID: [name], male   DOB: 12/07/1945, 70 y.o.   MRN: [id number]      [address]       [place] [address]             [phone]                     LOS: 2 days   Subjective: Felt better this am, but developed rapid afib when walking and  got weak  Objective: Vital signs in last 24 hours: Patient Vitals for the past 24 hrs:  BP Temp Temp src Pulse Resp SpO2 Weight  11/16/15 1330 (!) 120/58 98.7 F (37.1 C) Oral 90 17 95 % -  11/16/15 1048 - - - (!) 145 - - -  11/16/15 0915 (!) 94/53 - - - - - -  11/16/15 0500 - - - - - - 182 lb 12.2 oz (82.9 kg)  11/16/15 0300 136/70 - Oral 91 18 98 % -  11/15/15 2052 (!) 142/70 99.1 F (37.3 C) Oral 87 18 98 % -    Filed Weights   11/14/15 0503 11/15/15 0552 11/16/15 0500  Weight: 187 lb 6.3 oz (85 kg) 188 lb 1.6 oz (85.3 kg) 182 lb 12.2 oz (82.9 kg)    Hemodynamic parameters for last 24 hours:    Intake/Output from previous day: 10/02 0701 - 10/03 0700 In: 600 [P.O.:600] Out: 2450 [Urine:2450] Intake/Output this shift: Total I/O In: 520 [P.O.:520] Out: 700 [Urine:700]  Scheduled Meds: . amiodarone  200 mg Oral BID  . cephALEXin  500 mg Oral Q12H  . insulin aspart  0-9 Units Subcutaneous TID WC  . insulin glargine  20 Units Subcutaneous Q2200  . metroNIDAZOLE  500 mg Oral Q8H  . rivaroxaban  20 mg Oral Q supper  . saccharomyces boulardii  250 mg Oral BID   Continuous Infusions:  PRN Meds:.acetaminophen **OR** acetaminophen  General appearance: alert Neurologic: intact Heart: regular rate and rhythm, S1, S2 normal, no murmur, click, rub or gallop Lungs: clear to auscultation bilaterally Abdomen: soft, non-tender; bowel sounds normal; no masses,  no organomegaly Extremities: extremities normal, atraumatic, no cyanosis or edema and Homans sign is negative, no sign of DVT Wound: sternum stable well healed  Lab Results: CBC: Recent Labs  11/15/15 0525 11/16/15 0149  WBC 7.7 9.0  HGB  9.6* 9.8*  HCT 30.7* 30.8*  PLT 581* 581*   BMET:  Recent Labs  11/15/15 0525 11/16/15 0149  NA 137 134*  K 4.3 4.0  CL 105 99*  CO2 25 27  GLUCOSE 122* 119*  BUN <5* <5*  CREATININE 0.78 0.77  CALCIUM 8.7* 8.7*    PT/INR: No results for input(s): LABPROT, INR in the last 72 hours.  Bili normal  Echo today - no tamponade   Assessment/Plan: Still episodes of afib Treating urine Consider changing meds for afib as Cordarone may be contributing to nausea    [name] MD 11/16/2015 6:16 PM

## 2015-11-16 NOTE — Discharge Instructions (Addendum)
Information on my medicine - XARELTO (Rivaroxaban)  Why was Xarelto prescribed for you? Xarelto was prescribed for you to reduce the risk of a blood clot forming that can cause a stroke if you have a medical condition called atrial fibrillation (a type of irregular heartbeat).  What do you need to know about xarelto ? Take your Xarelto ONCE DAILY at the same time every day with your evening meal. If you have difficulty swallowing the tablet whole, you may crush it and mix in applesauce just prior to taking your dose.  Take Xarelto exactly as prescribed by your doctor and DO NOT stop taking Xarelto without talking to the doctor who prescribed the medication.  Stopping without other stroke prevention medication to take the place of Xarelto may increase your risk of developing a clot that causes a stroke.  Refill your prescription before you run out.  After discharge, you should have regular check-up appointments with your healthcare provider that is prescribing your Xarelto.  In the future your dose may need to be changed if your kidney function or weight changes by a significant amount.  What do you do if you miss a dose? If you are taking Xarelto ONCE DAILY and you miss a dose, take it as soon as you remember on the same day then continue your regularly scheduled once daily regimen the next day. Do not take two doses of Xarelto at the same time or on the same day.   Important Safety Information A possible side effect of Xarelto is bleeding. You should call your healthcare provider right away if you experience any of the following: ? Bleeding from an injury or your nose that does not stop. ? Unusual colored urine (red or dark brown) or unusual colored stools (red or black). ? Unusual bruising for unknown reasons. ? A serious fall or if you hit your head (even if there is no bleeding).  Some medicines may interact with Xarelto and might increase your risk of bleeding while on  Xarelto. To help avoid this, consult your healthcare provider or pharmacist prior to using any new prescription or non-prescription medications, including herbals, vitamins, non-steroidal anti-inflammatory drugs (NSAIDs) and supplements.  This website has more information on Xarelto: [email or web address].[email or web address]    Near-Syncope Near-syncope (commonly known as near fainting) is sudden weakness, dizziness, or feeling like you might pass out. During an episode of near-syncope, you may also develop pale skin, have tunnel vision, or feel sick to your stomach (nauseous). Near-syncope may occur when getting up after sitting or while standing for a long time. It is caused by a sudden decrease in blood flow to the brain. This decrease can result from various causes or triggers, most of which are not serious. However, because near-syncope can sometimes be a sign of something serious, a medical evaluation is required. The specific cause is often not determined. HOME CARE INSTRUCTIONS  Monitor your condition for any changes. The following actions may help to alleviate any discomfort you are experiencing:  Have someone stay with you until you feel stable.  Lie down right away and prop your feet up if you start feeling like you might faint. Breathe deeply and steadily. Wait until all the symptoms have passed. Most of these episodes last only a few minutes. You may feel tired for several hours.   Drink enough fluids to keep your urine clear or pale yellow.   If you are taking blood pressure or heart medicine, get up slowly when seated or lying down.  Take several minutes to sit and then stand. This can reduce dizziness.  Follow up with your health care provider as directed. SEEK IMMEDIATE MEDICAL CARE IF:   You have a severe headache.   You have unusual pain in the chest, abdomen, or back.   You are bleeding from the mouth or rectum, or you have black or tarry stool.   You have an irregular or very fast  heartbeat.   You have repeated fainting or have seizure-like jerking during an episode.   You faint when sitting or lying down.   You have confusion.   You have difficulty walking.   You have severe weakness.   You have vision problems.  MAKE SURE YOU:   Understand these instructions.  Will watch your condition.  Will get help right away if you are not doing well or get worse.   This information is not intended to replace advice given to you by your health care provider. Make sure you discuss any questions you have with your health care provider.   Document Released: 01/30/2005 Document Revised: 02/04/2013 Document Reviewed: 07/05/2012 Elsevier Interactive Patient Education 2016 Else[employer] Inc.   Orthostatic Hypotension Orthostatic hypotension is a sudden drop in blood pressure. It happens when you quickly stand up from a seated or lying position. You may feel dizzy or light-headed. This can last for just a few seconds or for up to a few minutes. It is usually not a serious problem. However, if this happens frequently or gets worse, it can be a sign of something more serious. CAUSES  Different things can cause orthostatic hypotension, including:   Loss of body fluids (dehydration).  Medicines that lower blood pressure.  Sudden changes in posture, such as standing up quickly after you have been sitting or lying down.  Taking too much of your medicine. SIGNS AND SYMPTOMS   Light-headedness or dizziness.   Fainting or near-fainting.   A fast heart rate.   Weakness.   Feeling tired (fatigue).  DIAGNOSIS  Your health care provider may do several things to help diagnose your condition and identify the cause. These may include:   Taking a medical history and doing a physical exam.  Checking your blood pressure. Your health care provider will check your blood pressure when you are:  Lying down.  Sitting.  Standing.  Using tilt table testing. In this  test, you lie down on a table that moves from a lying position to a standing position. You will be strapped onto the table. This test monitors your blood pressure and heart rate when you are in different positions. TREATMENT  Treatment will vary depending on the cause. Possible treatments include:   Changing the dosage of your medicines.  Wearing compression stockings on your lower legs.  Standing up slowly after sitting or lying down.  Eating more salt.  Eating frequent, small meals.  In some cases, getting IV fluids.  Taking medicine to enhance fluid retention. HOME CARE INSTRUCTIONS  Only take over-the-counter or prescription medicines as directed by your health care provider.  Follow your health care provider's instructions for changing the dosage of your current medicines.  Do not stop or adjust your medicine on your own.  Stand up slowly after sitting or lying down. This allows your body to adjust to the different position.  Wear compression stockings as directed.  Eat extra salt as directed.  Do not add extra salt to your diet unless directed to by your health care provider.  Eat  frequent, small meals.  Avoid standing suddenly after eating.  Avoid hot showers or excessive heat as directed by your health care provider.  Keep all follow-up appointments. SEEK MEDICAL CARE IF:  You continue to feel dizzy or light-headed after standing.  You feel groggy or confused.  You feel cold, clammy, or sick to your stomach (nauseous).  You have blurred vision.  You feel short of breath. SEEK IMMEDIATE MEDICAL CARE IF:   You faint after standing.  You have chest pain.  You have difficulty breathing.   You lose feeling or movement in your arms or legs.   You have slurred speech or difficulty talking, or you are unable to talk.  MAKE SURE YOU:   Understand these instructions.  Will watch your condition.  Will get help right away if you are not doing well  or get worse.   This information is not intended to replace advice given to you by your health care provider. Make sure you discuss any questions you have with your health care provider.   Document Released: 01/20/2002 Document Revised: 02/04/2013 Document Reviewed: 11/22/2012 Elsevier Interactive Patient Education [employer].

## 2015-11-16 NOTE — Progress Notes (Signed)
Patient Name: [name] Date of Encounter: 11/16/2015  Primary Cardiologist: Dr. 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 No CP. No dizziness. Urine is brown he states. No clots  Inpatient Medications    Scheduled Meds: . amiodarone  200 mg Oral BID  . cefTRIAXone (ROCEPHIN)  IV  1 g Intravenous Q24H  . insulin aspart  0-9 Units Subcutaneous TID WC  . insulin glargine  20 Units Subcutaneous Q2200  . metronidazole  500 mg Intravenous Q8H  . rivaroxaban  20 mg Oral Q supper  . saccharomyces boulardii  250 mg Oral BID   Continuous Infusions:   PRN Meds:.acetaminophen **OR** acetaminophen   Vital Signs    Vitals:   11/15/15 1350 11/15/15 2052 11/16/15 0300 11/16/15 0500  BP: (!) 143/68 (!) 142/70 136/70   Pulse: 90 87 91   Resp: 17 18 18    Temp: 98.1 F (36.7 C) 99.1 F (37.3 C)    TempSrc: Oral Oral Oral   SpO2: 98% 98% 98%   Weight:    182 lb 12.2 oz (82.9 kg)  Height:        Intake/Output Summary (Last 24 hours) at 11/16/15 0952 Last data filed at 11/16/15 0422  Gross per 24 hour  Intake              360 ml  Output             2450 ml  Net            -2090 ml   Filed Weights   11/14/15 0503 11/15/15 0552 11/16/15 0500  Weight: 187 lb 6.3 oz (85 kg) 188 lb 1.6 oz (85.3 kg) 182 lb 12.2 oz (82.9 kg)    Physical Exam    GEN: Well nourished, well developed, in no acute distress.  HEENT: Grossly normal.  Neck: Supple, no JVD, carotid bruits, or masses. Cardiac: RRR, no murmurs, rubs, or  gallops. No clubbing, cyanosis, trace BLE edema.  Radials/DP/PT 2+ and equal bilaterally. CABG scar Respiratory:  Respirations regular and unlabored, clear to auscultation bilaterally. GI: Soft, nontender, nondistended, BS + x 4. MS: no deformity or atrophy. Skin: warm and dry, no rash. Neuro:  Strength and sensation are intact. Psych: AAOx3.  Normal affect.  Labs    CBC  Recent Labs  11/13/15 1051  11/14/15 1906 11/15/15 0525 11/16/15 0149  WBC 15.5*  < > 7.3 7.7 9.0  NEUTROABS 12.9*  --  4.8  --   --   HGB 10.2*  < > 9.5* 9.6* 9.8*  HCT 31.0*  < > 29.9* 30.7* 30.8*  MCV 89.9  < > 90.3 89.8 89.3  PLT 559*  < > 521* 581* 581*  < > = values in this interval not displayed. Basic Metabolic Panel  Recent Labs  11/15/15 0525 11/16/15 0149  NA 137 134*  K 4.3 4.0  CL 105 99*  CO2 25 27  GLUCOSE 122* 119*  BUN <5* <5*  CREATININE  0.78 0.77  CALCIUM 8.7* 8.7*   Liver Function Tests  Recent Labs  11/13/15 1051 11/14/15 0917  AST 36 50*  ALT 37 43  ALKPHOS 95 85  BILITOT 0.9 1.1  PROT 6.9 6.7  ALBUMIN 2.7* 2.7*   No results for input(s): LIPASE, AMYLASE in the last 72 hours. Cardiac Enzymes  Recent Labs  11/13/15 1633 11/13/15 2352 11/14/15 0917  TROPONINI 0.41* 0.10* 0.12*     Telemetry    NSR, PAC's - Personally Reviewed  ECG     SR - Personally Reviewed  Radiology    No results found.   Cardiac Studies   ECHO 10/28/15 (prior to CABG/AVR)  - EF normal  Patient Profile     70 year old with CABG/AVR([employer] pericardial tissue valve), PAF, hematuria, hypotension  Assessment & Plan    CABG/AVR/aortic root replacement  - stable, no changes  - LIMA to LAD, SVG to first and second OM, SVG to RCA.  - Just DC'd a few days ago  Elevated troponin  - 0.41, likely post op/ demand. No signs of ACS  AFIB  - parox (saw during workout with cardiac rehab)  - [place]  - AMIO continue  - tele shows NSR with occas  PAC's  Hypotension  - agree with continued holding ARB/Lasix  - IVF with meds.   - in setting of N/V/D  Hematuria  - started Xarelto. Urine "back to normal" he states, monitor)  Signed, Candee Furbish, MD  11/16/2015, 9:52 AM

## 2015-11-17 ENCOUNTER — Ambulatory Visit: Payer: Self-pay | Admitting: Physician Assistant

## 2015-11-17 DIAGNOSIS — N3001 Acute cystitis with hematuria: Secondary | ICD-10-CM | POA: Diagnosis present

## 2015-11-17 DIAGNOSIS — R112 Nausea with vomiting, unspecified: Secondary | ICD-10-CM

## 2015-11-17 DIAGNOSIS — D62 Acute posthemorrhagic anemia: Secondary | ICD-10-CM | POA: Diagnosis present

## 2015-11-17 DIAGNOSIS — E1149 Type 2 diabetes mellitus with other diabetic neurological complication: Secondary | ICD-10-CM | POA: Diagnosis present

## 2015-11-17 DIAGNOSIS — R197 Diarrhea, unspecified: Secondary | ICD-10-CM

## 2015-11-17 DIAGNOSIS — I2510 Atherosclerotic heart disease of native coronary artery without angina pectoris: Secondary | ICD-10-CM | POA: Diagnosis present

## 2015-11-17 DIAGNOSIS — I951 Orthostatic hypotension: Secondary | ICD-10-CM | POA: Diagnosis not present

## 2015-11-17 DIAGNOSIS — Z7901 Long term (current) use of anticoagulants: Secondary | ICD-10-CM | POA: Diagnosis not present

## 2015-11-17 DIAGNOSIS — B488 Other specified mycoses: Secondary | ICD-10-CM | POA: Diagnosis present

## 2015-11-17 DIAGNOSIS — I480 Paroxysmal atrial fibrillation: Secondary | ICD-10-CM | POA: Diagnosis present

## 2015-11-17 DIAGNOSIS — N400 Enlarged prostate without lower urinary tract symptoms: Secondary | ICD-10-CM | POA: Diagnosis present

## 2015-11-17 DIAGNOSIS — R748 Abnormal levels of other serum enzymes: Secondary | ICD-10-CM | POA: Diagnosis not present

## 2015-11-17 DIAGNOSIS — Z833 Family history of diabetes mellitus: Secondary | ICD-10-CM | POA: Diagnosis not present

## 2015-11-17 DIAGNOSIS — I10 Essential (primary) hypertension: Secondary | ICD-10-CM

## 2015-11-17 DIAGNOSIS — Z79899 Other long term (current) drug therapy: Secondary | ICD-10-CM | POA: Diagnosis not present

## 2015-11-17 DIAGNOSIS — E785 Hyperlipidemia, unspecified: Secondary | ICD-10-CM

## 2015-11-17 DIAGNOSIS — E1165 Type 2 diabetes mellitus with hyperglycemia: Secondary | ICD-10-CM | POA: Diagnosis present

## 2015-11-17 DIAGNOSIS — R42 Dizziness and giddiness: Secondary | ICD-10-CM | POA: Diagnosis present

## 2015-11-17 DIAGNOSIS — E860 Dehydration: Secondary | ICD-10-CM | POA: Diagnosis present

## 2015-11-17 DIAGNOSIS — Z794 Long term (current) use of insulin: Secondary | ICD-10-CM | POA: Diagnosis not present

## 2015-11-17 DIAGNOSIS — E871 Hypo-osmolality and hyponatremia: Secondary | ICD-10-CM | POA: Diagnosis present

## 2015-11-17 DIAGNOSIS — B49 Unspecified mycosis: Secondary | ICD-10-CM | POA: Diagnosis not present

## 2015-11-17 DIAGNOSIS — B961 Klebsiella pneumoniae [K. pneumoniae] as the cause of diseases classified elsewhere: Secondary | ICD-10-CM | POA: Diagnosis present

## 2015-11-17 DIAGNOSIS — N390 Urinary tract infection, site not specified: Secondary | ICD-10-CM | POA: Diagnosis not present

## 2015-11-17 DIAGNOSIS — I248 Other forms of acute ischemic heart disease: Secondary | ICD-10-CM | POA: Diagnosis present

## 2015-11-17 DIAGNOSIS — Z951 Presence of aortocoronary bypass graft: Secondary | ICD-10-CM | POA: Diagnosis not present

## 2015-11-17 DIAGNOSIS — Z953 Presence of xenogenic heart valve: Secondary | ICD-10-CM | POA: Diagnosis not present

## 2015-11-17 DIAGNOSIS — Z808 Family history of malignant neoplasm of other organs or systems: Secondary | ICD-10-CM | POA: Diagnosis not present

## 2015-11-17 LAB — COMPREHENSIVE METABOLIC PANEL
ALT: 51 U/L (ref 17–63)
AST: 80 U/L — ABNORMAL HIGH (ref 15–41)
Albumin: 2.5 g/dL — ABNORMAL LOW (ref 3.5–5.0)
Alkaline Phosphatase: 91 U/L (ref 38–126)
Anion gap: 5 (ref 5–15)
BUN: 6 mg/dL (ref 6–20)
CO2: 28 mmol/L (ref 22–32)
Calcium: 8.8 mg/dL — ABNORMAL LOW (ref 8.9–10.3)
Chloride: 103 mmol/L (ref 101–111)
Creatinine, Ser: 0.86 mg/dL (ref 0.61–1.24)
GFR calc Af Amer: >60 mL/min (ref >60)
GFR calc non Af Amer: >60 mL/min (ref >60)
Glucose, Bld: 135 mg/dL — ABNORMAL HIGH (ref 65–99)
Potassium: 4.3 mmol/L (ref 3.5–5.1)
Sodium: 136 mmol/L (ref 135–145)
Total Bilirubin: 0.4 mg/dL (ref 0.3–1.2)
Total Protein: 6.4 g/dL — ABNORMAL LOW (ref 6.5–8.1)

## 2015-11-17 LAB — CBC
HCT: 31.4 % — ABNORMAL LOW (ref 39.0–52.0)
Hemoglobin: 10 g/dL — ABNORMAL LOW (ref 13.0–17.0)
MCH: 28.4 pg (ref 26.0–34.0)
MCHC: 31.8 g/dL (ref 30.0–36.0)
MCV: 89.2 fL (ref 78.0–100.0)
Platelets: 556 10*3/uL — ABNORMAL HIGH (ref 150–400)
RBC: 3.52 MIL/uL — ABNORMAL LOW (ref 4.22–5.81)
RDW: 14.6 % (ref 11.5–15.5)
WBC: 8.6 10*3/uL (ref 4.0–10.5)

## 2015-11-17 LAB — URINALYSIS, ROUTINE W REFLEX MICROSCOPIC
Bilirubin Urine: NEGATIVE
Glucose, UA: NEGATIVE mg/dL
Ketones, ur: 15 mg/dL — AB
Nitrite: POSITIVE — AB
Protein, ur: 30 mg/dL — AB
Specific Gravity, Urine: 1.018 (ref 1.005–1.030)
pH: 6 (ref 5.0–8.0)

## 2015-11-17 LAB — GLUCOSE, CAPILLARY
GLUCOSE-CAPILLARY: 142 mg/dL — AB (ref 65–99)
GLUCOSE-CAPILLARY: 143 mg/dL — AB (ref 65–99)
Glucose-Capillary: 124 mg/dL — ABNORMAL HIGH (ref 65–99)
Glucose-Capillary: 161 mg/dL — ABNORMAL HIGH (ref 65–99)

## 2015-11-17 LAB — URINE MICROSCOPIC-ADD ON

## 2015-11-17 MED ORDER — ATORVASTATIN CALCIUM 40 MG PO TABS
40.0000 mg | ORAL_TABLET | Freq: Every day | ORAL | Status: DC
  Administered 2015-11-17 – 2015-11-19 (×3): 40 mg via ORAL
  Filled 2015-11-17 (×3): qty 1

## 2015-11-17 NOTE — Progress Notes (Addendum)
Addendum  Received phone call from utilization review team who advised that patient meets criteria for observation at this time. Changed status to observation on 11/17/15 at 2:30 PM.   Received a call again from Ms. Vaughan Basta, UR who advised that patient should remain in patient status but is almost completing requirement for same. Changed again to in patient at 6 pm.  Vernell Leep, MD, FACP, FHM. [facility] Pager [phone]  If 7PM-7AM, please contact night-coverage [email or web address] Password [email or web address] 11/17/2015, 2:35 PM

## 2015-11-17 NOTE — Progress Notes (Addendum)
      [address]       [place] [address]             [phone]         Subjective: Feels better this morning. Still having occasional dizziness and lightheadedness when he is walking  Objective: Vital signs in last 24 hours: Temp:  [98.1 F (36.7 C)-98.7 F (37.1 C)] (P) 98.4 F (36.9 C) (10/04 0757) Pulse Rate:  [83-145] (P) 85 (10/04 0757) Cardiac Rhythm: Normal sinus rhythm (10/04 0704) Resp:  [17-20] 20 (10/04 0440) BP: (94-120)/(53-83) (P) 115/61 (10/04 0757) SpO2:  [95 %-99 %] (P) 96 % (10/04 0757) Weight:  [185 lb 14.4 oz (84.3 kg)] 185 lb 14.4 oz (84.3 kg) (10/04 0440)     Intake/Output from previous day: 10/03 0701 - 10/04 0700 In: 520 [P.O.:520] Out: 1700 [Urine:1000; Stool:700] Intake/Output this shift: No intake/output data recorded.  General appearance: alert, cooperative and no distress Neurologic: intact Heart: regular rate and rhythm, S1, S2 normal, no murmur, click, rub or gallop Lungs: clear to auscultation bilaterally Abdomen: soft, non-tender; bowel sounds normal; no masses,  no organomegaly Extremities: extremities normal, atraumatic, no cyanosis or edema Wound: healed  Lab Results:  Recent Labs  11/16/15 0149 11/17/15 0223  WBC 9.0 8.6  HGB 9.8* 10.0*  HCT 30.8* 31.4*  PLT 581* 556*   BMET:  Recent Labs  11/16/15 0149 11/17/15 0223  NA 134* 136  K 4.0 4.3  CL 99* 103  CO2 27 28  GLUCOSE 119* 135*  BUN <5* 6  CREATININE 0.77 0.86  CALCIUM 8.7* 8.8*    PT/INR: No results for input(s): LABPROT, INR in the last 72 hours. ABG    Component Value Date/Time   PHART 7.343 (L) 10/30/2015 0046   HCO3 23.0 10/30/2015 0224   TCO2 25 10/31/2015 1622   ACIDBASEDEF 2.0 10/30/2015 0224   O2SAT 98.0 10/30/2015 0224   CBG (last 3)   Recent Labs  11/16/15 1618 11/16/15 2138 11/17/15 0637  GLUCAP 225* 160* 124*    Assessment/Plan: S/p CABG x 4, AVR, and aortic root replacement on 11/01/2015, readmitted  1.  CV - History of a fib. SR in the 80's. On Amiodarone 200 mg bid.  On Xarelto. Echo reviewed 2.  ID-on Ceftriaxone and Flagyl. UC and BC thus far shows no growth. 3.  Acute blood loss anemia - H and H stable at 10.0 and 31.4. No hematuria- urine "back to normal" per patient. 4. DM-CBGs well controlled. On Insulin. 5. Management per medicine 6. Nausea/vomiting has resolved, tolerating a normal diet.   Plan: Continue medical management. Continues to go in and out of atrial fibrillation. Currently NSR rate 80s. On Xarelto. Ambulate TID with assistance.    LOS: 3 days    [name] 11/17/2015   Repeat ua ordered- yeast in urine - suggest ID consult to discuss treatment Remains unable to ambulate well due to episodes of rapid afib to 150's and orthostatic BP changes. I have seen and examined [name] and agree with the above assessment  and plan.  [name] MD [facility] [phone] Office [phone] 11/17/2015 8:27 PM

## 2015-11-17 NOTE — Progress Notes (Signed)
CARDIAC REHAB PHASE I   PRE:  Rate/Rhythm: 97 SR  BP:  Sitting: 130/64   Standing: 86/57       SaO2: 98 RA  MODE:  Ambulation: 350 ft   POST:  Rate/Rhythm: 156 A-fib initially then ST      Sitting: 106/59 sitting in hallway after 310 ft ambulation BP:  Sitting: 105/65 after completing ambulation (350 ft)        SaO2: 99 RA  Pt's bp dropped as he stood just before walking but pt stated he felt good and was not dizzy/lightheaded.  Pt walked 350 ft with Rw, one assist w/ gait belt and one pushing rolator in case pt needed to rest.  Pt tolerated well but had to stop and rest after walking 310 ft.  Pt stated he was beginning to feel tired and dizzy, HR was elevated at 156 A-fib initially then went to ST and gradually decreased.  BP was 106/59 sitting.  After a few minutes of resting pt stated he felt better and walked the last 40 ft to his room.  Pt stated he was unhappy with his lunch and requested a [place] sandwich.  Returned to chair, brought pt [place] sand and ice, call bell and phone in reach.  Will continue to follow. [place], [place] 11/17/2015 1:42 PM

## 2015-11-17 NOTE — Progress Notes (Signed)
PROGRESS NOTE  [name]  [id number] DOB: [birth year]  DOA: 11/13/2015 PCP: Drema Pry, DO   Brief Narrative:  70 year old male with PMH of aortic stenosis, CAD, type II DM, essential hypertension, hyperlipidemia, recently discharged from the hospital on 11/08/15 status post 4 vessel CABG and AVR ([employer] pericardial tissue valve) for management of CAD and aortic stenosis. Postoperative course was complicated by PAF which was treated with amiodarone and Xarelto. He was now readmitted with 2-3 days history of nausea, nonbloody emesis, diarrhea, orthostatic hypotension, hematuria and near syncope.   Assessment & Plan:   Principal Problem:   Orthostatic syncope Active Problems:   Type II diabetes mellitus with neurological manifestations, uncontrolled ([place])   Hyperlipidemia   Essential hypertension   Aortic stenosis s/p tissue valve (Sept 2017)   Hx of CABG (Sept 2017)   Atrial fibrillation (post op after CABG Sept 2017)   Nausea vomiting and diarrhea   Hematuria, gross   Dehydration with hyponatremia   Elevated troponin   Near syncope   Near syncope, likely related to hypotension/orthostatic hypotension - Due to several days' history of nausea, vomiting and diarrhea + medications - Now normotensive and asymptomatic. Monitor.  Nausea, vomiting and diarrhea - Unclear etiology.? Acute viral GE. GI pathogen panel PCR and C. difficile testing negative. Empirically started IV metronidazole was discontinued on 10/4. KUB without obstruction. - Resolved. Monitor.  Klebsiella pneumoniae UTI/hematuria - Patient had terminal dysuria and hematuria on admission which have since resolved. Patient transitioned from IV ceftriaxone to oral Keflex. Complete total 7 days treatment.  Dehydration with hyponatremia -Resolved.   Uncontrolled DM 2 - Hemoglobin A1c recently: 11.2. Metformin held due to current acute illness. Placed on reduced dose Lantus and SSI. -Reasonable  inpatient control.  Essential hypertension - Controlled.  Paroxysmal A. Fib - Paroxysmal seen during workout with cardiac rehabilitation. Cardiology follow-up appreciated. Continue amiodarone and Xarelto.  Mildly elevated troponin - Likely postop/demand ischemia. No signs of acute coronary syndrome. Cardiology on board.  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  -  LIMA to LAD, SVG to first and second OM, SVG to RCA. - Stable.    DVT prophylaxis: On Xarelto Code Status: Full Family Communication: None at bedside Disposition Plan: DC home when medically ready, possibly 10/5   Consultants:   Cardiothoracic surgery  Cardiology  Procedures:   None   Antimicrobials:   IV ceftriaxone 9/30 > 10/2    IV cephalexin 10/3 >  Metronidazole 9/30 > 10/3   Subjective: Feels better. No nausea or vomiting since morning of 10/3. Tolerating diet. Terminal dysuria and hematuria from admission have resolved. No dizziness, lightheadedness, palpitations, chest pain or dyspnea.   Objective:  Vitals:   11/16/15 1941 11/17/15 0440 11/17/15 0757 11/17/15 1119  BP: 118/62 112/83 115/61 (!) 115/57  Pulse: 83 99 85 88  Resp: 18 20 19    Temp: 98.1 F (36.7 C) 98.6 F (37 C) 98.4 F (36.9 C) 97.9 F (36.6 C)  TempSrc: Oral Oral Oral Oral  SpO2: 99% 97% 96% 100%  Weight:  84.3 kg (185 lb 14.4 oz)    Height:        Intake/Output Summary (Last 24 hours) at 11/17/15 1321 Last data filed at 11/17/15 1130  Gross per 24 hour  Intake              480 ml  Output  1400 ml  Net             -920 ml   Filed Weights   11/15/15 0552 11/16/15 0500 11/17/15 0440  Weight: 85.3 kg (188 lb 1.6 oz) 82.9 kg (182 lb 12.2 oz) 84.3 kg (185 lb 14.4 oz)    Examination:  General exam: Pleasant middle-aged male sitting comfortably in bed. Does not look septic or toxic.  Respiratory  system: Clear to auscultation. Respiratory effort normal. Healed midline sternotomy scar.  Cardiovascular system: S1 & S2 heard, RRR. No JVD, murmurs, rubs, gallops or clicks. No pedal edema. telemetry: Sinus rhythm. Noted brief episodes of a flutter/A. fib in the 150s 2 since midnight.  Gastrointestinal system: Abdomen is nondistended, soft and nontender. No organomegaly or masses felt. Normal bowel sounds heard. Central nervous system: Alert and oriented. No focal neurological deficits. Extremities: Symmetric 5 x 5 power. Skin: No rashes, lesions or ulcers Psychiatry: Judgement and insight appear normal. Mood & affect appropriate.     Data Reviewed: I have personally reviewed following labs and imaging studies  CBC:  Recent Labs Lab 11/13/15 1051 11/14/15 0917 11/14/15 1906 11/15/15 0525 11/16/15 0149 11/17/15 0223  WBC 15.5* 9.4 7.3 7.7 9.0 8.6  NEUTROABS 12.9*  --  4.8  --   --   --   HGB 10.2* 9.5* 9.5* 9.6* 9.8* 10.0*  HCT 31.0* 30.0* 29.9* 30.7* 30.8* 31.4*  MCV 89.9 91.2 90.3 89.8 89.3 89.2  PLT 559* 620* 521* 581* 581* [id number]*   Basic Metabolic Panel:  Recent Labs Lab 11/13/15 1051 11/14/15 0917 11/15/15 0525 11/16/15 0149 11/17/15 0223  NA 132* 137 137 134* 136  K 4.4 3.9 4.3 4.0 4.3  CL 95* 103 105 99* 103  CO2 29 25 25 27 28   GLUCOSE 190* 138* 122* 119* 135*  BUN 16 8 <5* <5* 6  CREATININE 1.09 0.92 0.78 0.77 0.86  CALCIUM 8.6* 8.5* 8.7* 8.7* 8.8*   GFR: Estimated Creatinine Clearance: 90.2 mL/min (by C-G formula based on SCr of 0.86 mg/dL). Liver Function Tests:  Recent Labs Lab 11/13/15 1051 11/14/15 0917 11/17/15 0223  AST 36 50* 80*  ALT 37 43 51  ALKPHOS 95 85 91  BILITOT 0.9 1.1 0.4  PROT 6.9 6.7 6.4*  ALBUMIN 2.7* 2.7* 2.5*   No results for input(s): LIPASE, AMYLASE in the last 168 hours. No results for input(s): AMMONIA in the last 168 hours. Coagulation Profile: No results for input(s): INR, PROTIME in the last 168 hours. Cardiac  Enzymes:  Recent Labs Lab 11/13/15 1051 11/13/15 1633 11/13/15 2352 11/14/15 0917  TROPONINI 0.15* 0.41* 0.10* 0.12*   BNP (last 3 results) No results for input(s): PROBNP in the last 8760 hours. HbA1C: No results for input(s): HGBA1C in the last 72 hours. CBG:  Recent Labs Lab 11/16/15 1121 11/16/15 1618 11/16/15 2138 11/17/15 0637 11/17/15 1129  GLUCAP 169* 225* 160* 124* 161*   Lipid Profile: No results for input(s): CHOL, HDL, LDLCALC, TRIG, CHOLHDL, LDLDIRECT in the last 72 hours. Thyroid Function Tests: No results for input(s): TSH, T4TOTAL, FREET4, T3FREE, THYROIDAB in the last 72 hours. Anemia Panel: No results for input(s): VITAMINB12, FOLATE, FERRITIN, TIBC, IRON, RETICCTPCT in the last 72 hours.  Sepsis Labs:  Recent Labs Lab 11/13/15 1428 11/13/15 1442 11/13/15 1915  PROCALCITON 0.39  --   --   LATICACIDVEN  --  1.2 1.3    Recent Results (from the past 240 hour(s))  Urine culture     Status: Abnormal  Collection Time: 11/13/15  2:17 PM  Result Value Ref Range Status   Specimen Description URINE, RANDOM  Final   Special Requests NONE  Final   Culture >=100,000 COLONIES/mL KLEBSIELLA PNEUMONIAE (A)  Final   Report Status 11/15/2015 FINAL  Final   Organism ID, Bacteria KLEBSIELLA PNEUMONIAE (A)  Final      Susceptibility   Klebsiella pneumoniae - MIC*    AMPICILLIN >=32 RESISTANT Resistant     CEFAZOLIN <=4 SENSITIVE Sensitive     CEFTRIAXONE <=1 SENSITIVE Sensitive     CIPROFLOXACIN <=0.25 SENSITIVE Sensitive     GENTAMICIN <=1 SENSITIVE Sensitive     IMIPENEM <=0.25 SENSITIVE Sensitive     NITROFURANTOIN 32 SENSITIVE Sensitive     TRIMETH/SULFA <=20 SENSITIVE Sensitive     AMPICILLIN/SULBACTAM 4 SENSITIVE Sensitive     PIP/TAZO <=4 SENSITIVE Sensitive     Extended ESBL NEGATIVE Sensitive     * >=100,000 COLONIES/mL KLEBSIELLA PNEUMONIAE  C difficile quick scan w PCR reflex     Status: None   Collection Time: 11/13/15  2:25 PM  Result  Value Ref Range Status   C Diff antigen NEGATIVE NEGATIVE Final   C Diff toxin NEGATIVE NEGATIVE Final   C Diff interpretation No C. difficile detected.  Final  Gastrointestinal Panel by PCR , Stool     Status: None   Collection Time: 11/13/15  2:25 PM  Result Value Ref Range Status   Campylobacter species NOT DETECTED NOT DETECTED Final   Plesimonas shigelloides NOT DETECTED NOT DETECTED Final   Salmonella species NOT DETECTED NOT DETECTED Final   Yersinia enterocolitica NOT DETECTED NOT DETECTED Final   Vibrio species NOT DETECTED NOT DETECTED Final   Vibrio cholerae NOT DETECTED NOT DETECTED Final   Enteroaggregative E coli (EAEC) NOT DETECTED NOT DETECTED Final   Enteropathogenic E coli (EPEC) NOT DETECTED NOT DETECTED Final   Enterotoxigenic E coli (ETEC) NOT DETECTED NOT DETECTED Final   Shiga like toxin producing E coli (STEC) NOT DETECTED NOT DETECTED Final   Shigella/Enteroinvasive E coli (EIEC) NOT DETECTED NOT DETECTED Final   Cryptosporidium NOT DETECTED NOT DETECTED Final   Cyclospora cayetanensis NOT DETECTED NOT DETECTED Final   Entamoeba histolytica NOT DETECTED NOT DETECTED Final   Giardia lamblia NOT DETECTED NOT DETECTED Final   Adenovirus F40/41 NOT DETECTED NOT DETECTED Final   Astrovirus NOT DETECTED NOT DETECTED Final   Norovirus GI/GII NOT DETECTED NOT DETECTED Final   Rotavirus A NOT DETECTED NOT DETECTED Final   Sapovirus (I, II, IV, and V) NOT DETECTED NOT DETECTED Final  Culture, blood (Routine X 2) w Reflex to ID Panel     Status: None (Preliminary result)   Collection Time: 11/13/15  4:35 PM  Result Value Ref Range Status   Specimen Description BLOOD BLOOD RIGHT HAND  Final   Special Requests IN PEDIATRIC BOTTLE 2CC  Final   Culture NO GROWTH 3 DAYS  Final   Report Status PENDING  Incomplete  Culture, blood (Routine X 2) w Reflex to ID Panel     Status: None (Preliminary result)   Collection Time: 11/13/15  4:49 PM  Result Value Ref Range Status    Specimen Description BLOOD RIGHT ANTECUBITAL  Final   Special Requests IN PEDIATRIC BOTTLE 2CC  Final   Culture NO GROWTH 3 DAYS  Final   Report Status PENDING  Incomplete         Radiology Studies: No results found.      Scheduled Meds: .  amiodarone  200 mg Oral BID  . cephALEXin  500 mg Oral Q12H  . insulin aspart  0-9 Units Subcutaneous TID WC  . insulin glargine  20 Units Subcutaneous Q2200  . rivaroxaban  20 mg Oral Q supper  . saccharomyces boulardii  250 mg Oral BID   Continuous Infusions:    LOS: 3 days       [facility], MD [facility] Pager [phone]  If 7PM-7AM, please contact night-coverage [email or web address] Password [email or web address] 11/17/2015, 1:21 PM

## 2015-11-17 NOTE — Progress Notes (Signed)
Patient Name: [name] Date of Encounter: 11/17/2015  Primary Cardiologist: Dr. Elroy Channel Problem List     Principal Problem:   Orthostatic syncope Active Problems:   Type II diabetes mellitus with neurological manifestations, uncontrolled ([place])   Hyperlipidemia   Essential hypertension   Aortic stenosis s/p tissue valve (Sept 2017)   Hx of CABG (Sept 2017)   Atrial fibrillation (post op after CABG Sept 2017)   Nausea vomiting and diarrhea   Hematuria, gross   Dehydration with hyponatremia   Elevated troponin   Near syncope     Subjective   Still having some issues with PAF yesterday with rehab-- RVR. No CP, no SOB  Inpatient Medications    Scheduled Meds: . amiodarone  200 mg Oral BID  . cephALEXin  500 mg Oral Q12H  . insulin aspart  0-9 Units Subcutaneous TID WC  . insulin glargine  20 Units Subcutaneous Q2200  . metroNIDAZOLE  500 mg Oral Q8H  . rivaroxaban  20 mg Oral Q supper  . saccharomyces boulardii  250 mg Oral BID   Continuous Infusions:   PRN Meds:.acetaminophen **OR** acetaminophen   Vital Signs    Vitals:   11/16/15 1330 11/16/15 1941 11/17/15 0440 11/17/15 0757  BP: (!) 120/58 118/62 112/83 115/61  Pulse: 90 83 99 85  Resp: 17 18 20 19   Temp: 98.7 F (37.1 C) 98.1 F (36.7 C) 98.6 F (37 C) 98.4 F (36.9 C)  TempSrc: Oral Oral Oral Oral  SpO2: 95% 99% 97% 96%  Weight:   185 lb 14.4 oz (84.3 kg)   Height:        Intake/Output Summary (Last 24 hours) at 11/17/15 0845 Last data filed at 11/17/15 0338  Gross per 24 hour  Intake              400 ml  Output             1400 ml  Net            -1000 ml   Filed Weights   11/15/15 0552 11/16/15 0500 11/17/15 0440  Weight: 188 lb 1.6 oz (85.3 kg) 182 lb 12.2 oz (82.9 kg) 185 lb 14.4 oz (84.3 kg)    Physical Exam    GEN: Well nourished, well developed, in no acute distress.  HEENT: Grossly normal.  Neck: Supple, no JVD, carotid bruits, or masses. Cardiac: RRR, no  murmurs, rubs, or gallops. No clubbing, cyanosis, trace BLE edema.  Radials/DP/PT 2+ and equal bilaterally. CABG scar Respiratory:  Respirations regular and unlabored, clear to auscultation bilaterally. GI: Soft, nontender, nondistended, BS + x 4. MS: no deformity or atrophy. Skin: warm and dry, no rash. Neuro:  Strength and sensation are intact. Psych: AAOx3.  Normal affect.  Labs    CBC  Recent Labs  11/14/15 1906  11/16/15 0149 11/17/15 0223  WBC 7.3  < > 9.0 8.6  NEUTROABS 4.8  --   --   --   HGB 9.5*  < > 9.8* 10.0*  HCT 29.9*  < > 30.8* 31.4*  MCV 90.3  < > 89.3 89.2  PLT 521*  < > 581* 556*  < > = values in this interval not displayed. Basic Metabolic Panel  Recent Labs  11/16/15 0149 11/17/15 0223  NA 134* 136  K 4.0 4.3  CL 99* 103  CO2 27 28  GLUCOSE 119* 135*  BUN <5* 6  CREATININE 0.77 0.86  CALCIUM 8.7* 8.8*  Liver Function Tests  Recent Labs  11/14/15 0917 11/17/15 0223  AST 50* 80*  ALT 43 51  ALKPHOS 85 91  BILITOT 1.1 0.4  PROT 6.7 6.4*  ALBUMIN 2.7* 2.5*   No results for input(s): LIPASE, AMYLASE in the last 72 hours. Cardiac Enzymes  Recent Labs  11/14/15 0917  TROPONINI 0.12*     Telemetry    NSR, PAC's (episode of AFIB with exercise-RVR)- Personally Reviewed  ECG     SR - Personally Reviewed  Radiology    No results found.   Cardiac Studies   ECHO 10/28/15 (prior to CABG/AVR)  - EF normal  Patient Profile     70 year old with CABG/AVR([employer] pericardial tissue valve), PAF, hematuria, hypotension  Assessment & Plan    CABG/AVR/aortic root replacement  - stable, no changes  - LIMA to LAD, SVG to first and second OM, SVG to RCA.   Elevated troponin  - 0.41, likely post op/ demand. No signs of ACS  AFIB  - parox (saw during workout with cardiac rehab)  - [place]  - AMIO continue - he denies nausea this AM. Hopefully as he heals we will see less and less AFIB. Amio is our most efficacious.    - tele shows NSR with occas PAC's  Hypotension  - agree with continued holding ARB/Lasix  -encourage PO   Hematuria  -  Xarelto. Urine "back to normal" he states, monitor  Signed, Candee Furbish, MD  11/17/2015, 8:45 AM

## 2015-11-17 NOTE — Care Management Important Message (Signed)
Important Message  Patient Details  Name: [name] MRN: [id number] Date of Birth: Jan 02, 1946   Medicare Important Message Given:  Yes    Nathen May 11/17/2015, 10:32 AM

## 2015-11-18 DIAGNOSIS — N390 Urinary tract infection, site not specified: Secondary | ICD-10-CM

## 2015-11-18 DIAGNOSIS — B49 Unspecified mycosis: Secondary | ICD-10-CM

## 2015-11-18 DIAGNOSIS — R262 Difficulty in walking, not elsewhere classified: Secondary | ICD-10-CM

## 2015-11-18 DIAGNOSIS — B961 Klebsiella pneumoniae [K. pneumoniae] as the cause of diseases classified elsewhere: Secondary | ICD-10-CM

## 2015-11-18 DIAGNOSIS — I951 Orthostatic hypotension: Secondary | ICD-10-CM

## 2015-11-18 DIAGNOSIS — N3000 Acute cystitis without hematuria: Secondary | ICD-10-CM

## 2015-11-18 LAB — CULTURE, BLOOD (ROUTINE X 2)
Culture: NO GROWTH
Culture: NO GROWTH

## 2015-11-18 LAB — GLUCOSE, CAPILLARY
GLUCOSE-CAPILLARY: 231 mg/dL — AB (ref 65–99)
GLUCOSE-CAPILLARY: 171 mg/dL — AB (ref 65–99)
Glucose-Capillary: 140 mg/dL — ABNORMAL HIGH (ref 65–99)
Glucose-Capillary: 181 mg/dL — ABNORMAL HIGH (ref 65–99)

## 2015-11-18 MED ORDER — MIDODRINE HCL 5 MG PO TABS
5.0000 mg | ORAL_TABLET | Freq: Three times a day (TID) | ORAL | Status: DC
  Administered 2015-11-18 – 2015-11-19 (×3): 5 mg via ORAL
  Filled 2015-11-18 (×3): qty 1

## 2015-11-18 NOTE — Progress Notes (Signed)
PROGRESS NOTE  [name]  [id number] DOB: 1945-11-23  DOA: 11/13/2015 PCP: Drema Pry, DO   Brief Narrative:  70 year old male with PMH of aortic stenosis, CAD, type II DM, essential hypertension, hyperlipidemia, recently discharged from the hospital on 11/08/15 status post 4 vessel CABG and AVR ([employer] pericardial tissue valve) for management of CAD and aortic stenosis. Postoperative course was complicated by PAF which was treated with amiodarone and Xarelto. He was now readmitted with 2-3 days history of nausea, nonbloody emesis, diarrhea, orthostatic hypotension, hematuria and near syncope.   Assessment & Plan:   Principal Problem:   Orthostatic syncope Active Problems:   Type II diabetes mellitus with neurological manifestations, uncontrolled ([place])   Hyperlipidemia   Essential hypertension   Aortic stenosis s/p tissue valve (Sept 2017)   Hx of CABG (Sept 2017)   Atrial fibrillation (post op after CABG Sept 2017)   Nausea vomiting and diarrhea   Hematuria, gross   Dehydration with hyponatremia   Elevated troponin   Near syncope   Near syncope, likely related to hypotension/orthostatic hypotension - Due to several days' history of nausea, vomiting and diarrhea + medications - As per discussion with therapy, continued orthostatic hypotension and symptomatic of same. On 11/18/15, SBP dropped from 125 supine >78 mmHg standing. - TED hose placed by TCTS - As discussed with Dr. Servando Snare and Dr. Marlou Porch, trial of midodrine. Holding ARB and diuretics.  Nausea, vomiting and diarrhea - Unclear etiology.? Acute viral GE. GI pathogen panel PCR and C. difficile testing negative. Empirically started IV metronidazole was discontinued on 10/4. KUB without obstruction. - Resolved. Monitor.  Klebsiella pneumoniae UTI/hematuria - Patient had terminal dysuria and hematuria on admission. Patient transitioned from IV ceftriaxone to oral Keflex. Complete total 7 days  treatment. - Patient complains of "tea-colored" urine and urine microscopy still has hematuria. This will need to be followed up to resolution or will need evaluation if persists, possibly as outpatient. - Repeat urine microscopy 10/4 shows yeast-infectious disease consulted 10/5 to see if he would need treatment given recent AVR and CABG.  Dehydration with hyponatremia -Resolved.   Uncontrolled DM 2 - Hemoglobin A1c recently: 11.2. Metformin held due to current acute illness. Placed on reduced dose Lantus and SSI. -Reasonable inpatient control.  Essential hypertension - Controlled. Orthostatic changes. Start on oral midodrine for same and monitor for supine hypertension.  Paroxysmal A. Fib - Paroxysmal seen during workout with cardiac rehabilitation. Cardiology follow-up appreciated. Continue amiodarone and Xarelto. - Patient had peroxisomal of A. fib with RVR up to 130s early morning 10/5.  Mildly elevated troponin - Likely postop/demand ischemia. No signs of acute coronary syndrome. Cardiology on board.  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  -  LIMA to LAD, SVG to first and second OM, SVG to RCA. - Stable.    DVT prophylaxis: On Xarelto Code Status: Full Family Communication: None at bedside Disposition Plan: DC home when medically ready. Patient not medically stable for discharge given episodic A. fib with RVR associated with activity and symptomatic orthostatic hypotension.   Consultants:   Cardiothoracic surgery  Cardiology  Procedures:   None   Antimicrobials:   IV ceftriaxone 9/30 > 10/2    IV cephalexin 10/3 >  Metronidazole 9/30 > 10/3   Subjective: After walking for a little distance, felt dizzy and lightheaded yesterday associated with A. fib with RVR. No nausea or vomiting. Tolerating diet. "Tea-colored  urine" without  dysuria.  Objective:  Vitals:   11/17/15 1119 11/17/15 1300 11/17/15 2021 11/18/15 0507  BP: (!) 115/57 112/66 134/66 120/62  Pulse: 88 91 82 87  Resp:   18 18  Temp: 97.9 F (36.6 C)  98.9 F (37.2 C) 98.8 F (37.1 C)  TempSrc: Oral Oral Oral Oral  SpO2: 100% 97% 98% 98%  Weight:    83.6 kg (184 lb 3.2 oz)  Height:        Intake/Output Summary (Last 24 hours) at 11/18/15 1211 Last data filed at 11/18/15 0800  Gross per 24 hour  Intake              840 ml  Output              901 ml  Net              -61 ml   Filed Weights   11/16/15 0500 11/17/15 0440 11/18/15 0507  Weight: 82.9 kg (182 lb 12.2 oz) 84.3 kg (185 lb 14.4 oz) 83.6 kg (184 lb 3.2 oz)    Examination:  General exam: Pleasant middle-aged male sitting comfortably in bed getting ready to work with therapy. Does not look septic or toxic.  Respiratory system: Clear to auscultation. Respiratory effort normal. Healed midline sternotomy scar.  Cardiovascular system: S1 & S2 heard, RRR. No JVD, murmurs, rubs, gallops or clicks. No pedal edema. telemetry: Sinus rhythm. Brief episode of A. fib with RVR in the 130s early 10/5 morning. Gastrointestinal system: Abdomen is nondistended, soft and nontender. No organomegaly or masses felt. Normal bowel sounds heard. Central nervous system: Alert and oriented. No focal neurological deficits. Extremities: Symmetric 5 x 5 power. Bilateral knee-high TED hose. Skin: No rashes, lesions or ulcers Psychiatry: Judgement and insight appear normal. Mood & affect appropriate.     Data Reviewed: I have personally reviewed following labs and imaging studies  CBC:  Recent Labs Lab 11/13/15 1051 11/14/15 0917 11/14/15 1906 11/15/15 0525 11/16/15 0149 11/17/15 0223  WBC 15.5* 9.4 7.3 7.7 9.0 8.6  NEUTROABS 12.9*  --  4.8  --   --   --   HGB 10.2* 9.5* 9.5* 9.6* 9.8* 10.0*  HCT 31.0* 30.0* 29.9* 30.7* 30.8* 31.4*  MCV 89.9 91.2 90.3 89.8 89.3 89.2  PLT 559* 620* 521* 581* 581*  [id number]*   Basic Metabolic Panel:  Recent Labs Lab 11/13/15 1051 11/14/15 0917 11/15/15 0525 11/16/15 0149 11/17/15 0223  NA 132* 137 137 134* 136  K 4.4 3.9 4.3 4.0 4.3  CL 95* 103 105 99* 103  CO2 29 25 25 27 28   GLUCOSE 190* 138* 122* 119* 135*  BUN 16 8 <5* <5* 6  CREATININE 1.09 0.92 0.78 0.77 0.86  CALCIUM 8.6* 8.5* 8.7* 8.7* 8.8*   GFR: Estimated Creatinine Clearance: 90.2 mL/min (by C-G formula based on SCr of 0.86 mg/dL). Liver Function Tests:  Recent Labs Lab 11/13/15 1051 11/14/15 0917 11/17/15 0223  AST 36 50* 80*  ALT 37 43 51  ALKPHOS 95 85 91  BILITOT 0.9 1.1 0.4  PROT 6.9 6.7 6.4*  ALBUMIN 2.7* 2.7* 2.5*   No results for input(s): LIPASE, AMYLASE in the last 168 hours. No results for input(s): AMMONIA in the last 168 hours. Coagulation Profile: No results for input(s): INR, PROTIME in the last 168 hours. Cardiac Enzymes:  Recent Labs Lab 11/13/15 1051 11/13/15 1633 11/13/15 2352 11/14/15 0917  TROPONINI 0.15* 0.41* 0.10* 0.12*   BNP (last 3 results) No  results for input(s): PROBNP in the last 8760 hours. HbA1C: No results for input(s): HGBA1C in the last 72 hours. CBG:  Recent Labs Lab 11/17/15 1129 11/17/15 1643 11/17/15 2025 11/18/15 0619 11/18/15 1105  GLUCAP 161* 142* 143* 140* 231*   Lipid Profile: No results for input(s): CHOL, HDL, LDLCALC, TRIG, CHOLHDL, LDLDIRECT in the last 72 hours. Thyroid Function Tests: No results for input(s): TSH, T4TOTAL, FREET4, T3FREE, THYROIDAB in the last 72 hours. Anemia Panel: No results for input(s): VITAMINB12, FOLATE, FERRITIN, TIBC, IRON, RETICCTPCT in the last 72 hours.  Sepsis Labs:  Recent Labs Lab 11/13/15 1428 11/13/15 1442 11/13/15 1915  PROCALCITON 0.39  --   --   LATICACIDVEN  --  1.2 1.3    Recent Results (from the past 240 hour(s))  Urine culture     Status: Abnormal   Collection Time: 11/13/15  2:17 PM  Result Value Ref Range Status   Specimen Description URINE,  RANDOM  Final   Special Requests NONE  Final   Culture >=100,000 COLONIES/mL KLEBSIELLA PNEUMONIAE (A)  Final   Report Status 11/15/2015 FINAL  Final   Organism ID, Bacteria KLEBSIELLA PNEUMONIAE (A)  Final      Susceptibility   Klebsiella pneumoniae - MIC*    AMPICILLIN >=32 RESISTANT Resistant     CEFAZOLIN <=4 SENSITIVE Sensitive     CEFTRIAXONE <=1 SENSITIVE Sensitive     CIPROFLOXACIN <=0.25 SENSITIVE Sensitive     GENTAMICIN <=1 SENSITIVE Sensitive     IMIPENEM <=0.25 SENSITIVE Sensitive     NITROFURANTOIN 32 SENSITIVE Sensitive     TRIMETH/SULFA <=20 SENSITIVE Sensitive     AMPICILLIN/SULBACTAM 4 SENSITIVE Sensitive     PIP/TAZO <=4 SENSITIVE Sensitive     Extended ESBL NEGATIVE Sensitive     * >=100,000 COLONIES/mL KLEBSIELLA PNEUMONIAE  C difficile quick scan w PCR reflex     Status: None   Collection Time: 11/13/15  2:25 PM  Result Value Ref Range Status   C Diff antigen NEGATIVE NEGATIVE Final   C Diff toxin NEGATIVE NEGATIVE Final   C Diff interpretation No C. difficile detected.  Final  Gastrointestinal Panel by PCR , Stool     Status: None   Collection Time: 11/13/15  2:25 PM  Result Value Ref Range Status   Campylobacter species NOT DETECTED NOT DETECTED Final   Plesimonas shigelloides NOT DETECTED NOT DETECTED Final   Salmonella species NOT DETECTED NOT DETECTED Final   Yersinia enterocolitica NOT DETECTED NOT DETECTED Final   Vibrio species NOT DETECTED NOT DETECTED Final   Vibrio cholerae NOT DETECTED NOT DETECTED Final   Enteroaggregative E coli (EAEC) NOT DETECTED NOT DETECTED Final   Enteropathogenic E coli (EPEC) NOT DETECTED NOT DETECTED Final   Enterotoxigenic E coli (ETEC) NOT DETECTED NOT DETECTED Final   Shiga like toxin producing E coli (STEC) NOT DETECTED NOT DETECTED Final   Shigella/Enteroinvasive E coli (EIEC) NOT DETECTED NOT DETECTED Final   Cryptosporidium NOT DETECTED NOT DETECTED Final   Cyclospora cayetanensis NOT DETECTED NOT DETECTED  Final   Entamoeba histolytica NOT DETECTED NOT DETECTED Final   Giardia lamblia NOT DETECTED NOT DETECTED Final   Adenovirus F40/41 NOT DETECTED NOT DETECTED Final   Astrovirus NOT DETECTED NOT DETECTED Final   Norovirus GI/GII NOT DETECTED NOT DETECTED Final   Rotavirus A NOT DETECTED NOT DETECTED Final   Sapovirus (I, II, IV, and V) NOT DETECTED NOT DETECTED Final  Culture, blood (Routine X 2) w Reflex to ID Panel  Status: None (Preliminary result)   Collection Time: 11/13/15  4:35 PM  Result Value Ref Range Status   Specimen Description BLOOD BLOOD RIGHT HAND  Final   Special Requests IN PEDIATRIC BOTTLE 2CC  Final   Culture NO GROWTH 4 DAYS  Final   Report Status PENDING  Incomplete  Culture, blood (Routine X 2) w Reflex to ID Panel     Status: None (Preliminary result)   Collection Time: 11/13/15  4:49 PM  Result Value Ref Range Status   Specimen Description BLOOD RIGHT ANTECUBITAL  Final   Special Requests IN PEDIATRIC BOTTLE 2CC  Final   Culture NO GROWTH 4 DAYS  Final   Report Status PENDING  Incomplete         Radiology Studies: No results found.      Scheduled Meds: . amiodarone  200 mg Oral BID  . atorvastatin  40 mg Oral q1800  . cephALEXin  500 mg Oral Q12H  . insulin aspart  0-9 Units Subcutaneous TID WC  . insulin glargine  20 Units Subcutaneous Q2200  . rivaroxaban  20 mg Oral Q supper  . saccharomyces boulardii  250 mg Oral BID   Continuous Infusions:    LOS: 4 days       [facility], MD [facility] Pager [phone]  If 7PM-7AM, please contact night-coverage [email or web address] Password [email or web address] 11/18/2015, 12:11 PM

## 2015-11-18 NOTE — Progress Notes (Signed)
CARDIAC REHAB PHASE I   PRE:  Rate/Rhythm: 83 SR    BP: sitting 127/69, standing 89/61    SaO2: 98 RA  MODE:  Ambulation: 350 ft   POST:  Rate/Rhythm: 113 ST    BP: sitting 119/65     SaO2: 98 RA  Pt orthostatic however not symptomatic. Able to walk 350 ft with RW without problems. No afib, no dizziness. Felt well. HR up to 113 ST. To recliner. Encouraged more walking today with staff. I followed with rollator seat however he did not need to sit. [place], ACSM 11/18/2015 9:31 AM

## 2015-11-18 NOTE — Progress Notes (Addendum)
Patient Name: [name] Date of Encounter: 11/18/2015  Primary Cardiologist: Dr. Elroy Channel Problem List     Principal Problem:   Orthostatic syncope Active Problems:   Type II diabetes mellitus with neurological manifestations, uncontrolled ([place])   Hyperlipidemia   Essential hypertension   Aortic stenosis s/p tissue valve (Sept 2017)   Hx of CABG (Sept 2017)   Atrial fibrillation (post op after CABG Sept 2017)   Nausea vomiting and diarrhea   Hematuria, gross   Dehydration with hyponatremia   Elevated troponin   Near syncope     Subjective   Still having some issues with PAF today with rehab-- RVR. No CP, no SOB. Still dizzy with standing.   Inpatient Medications    Scheduled Meds: . amiodarone  200 mg Oral BID  . atorvastatin  40 mg Oral q1800  . cephALEXin  500 mg Oral Q12H  . insulin aspart  0-9 Units Subcutaneous TID WC  . insulin glargine  20 Units Subcutaneous Q2200  . rivaroxaban  20 mg Oral Q supper  . saccharomyces boulardii  250 mg Oral BID   Continuous Infusions:   PRN Meds:.acetaminophen **OR** acetaminophen   Vital Signs    Vitals:   11/17/15 1119 11/17/15 1300 11/17/15 2021 11/18/15 0507  BP: (!) 115/57 112/66 134/66 120/62  Pulse: 88 91 82 87  Resp:   18 18  Temp: 97.9 F (36.6 C)  98.9 F (37.2 C) 98.8 F (37.1 C)  TempSrc: Oral Oral Oral Oral  SpO2: 100% 97% 98% 98%  Weight:    184 lb 3.2 oz (83.6 kg)  Height:        Intake/Output Summary (Last 24 hours) at 11/18/15 0941 Last data filed at 11/18/15 0530  Gross per 24 hour  Intake              960 ml  Output              901 ml  Net               59 ml   Filed Weights   11/16/15 0500 11/17/15 0440 11/18/15 0507  Weight: 182 lb 12.2 oz (82.9 kg) 185 lb 14.4 oz (84.3 kg) 184 lb 3.2 oz (83.6 kg)    Physical Exam    GEN: Well nourished, well developed, in no acute distress.  HEENT: Grossly normal.  Neck: Supple, no JVD, carotid bruits, or masses. Cardiac:  RRR, no murmurs, rubs, or gallops. No clubbing, cyanosis, trace BLE edema.  Radials/DP/PT 2+ and equal bilaterally. CABG scar Respiratory:  Respirations regular and unlabored, clear to auscultation bilaterally. GI: Soft, nontender, nondistended, BS + x 4. MS: no deformity or atrophy. Skin: warm and dry, no rash. Neuro:  Strength and sensation are intact. Psych: AAOx3.  Normal affect.  Labs    CBC  Recent Labs  11/16/15 0149 11/17/15 0223  WBC 9.0 8.6  HGB 9.8* 10.0*  HCT 30.8* 31.4*  MCV 89.3 89.2  PLT 581* [id number]*   Basic Metabolic Panel  Recent Labs  11/16/15 0149 11/17/15 0223  NA 134* 136  K 4.0 4.3  CL 99* 103  CO2 27 28  GLUCOSE 119* 135*  BUN <5* 6  CREATININE 0.77 0.86  CALCIUM 8.7* 8.8*   Liver Function Tests  Recent Labs  11/17/15 0223  AST 80*  ALT 51  ALKPHOS 91  BILITOT 0.4  PROT 6.4*  ALBUMIN 2.5*     Telemetry    NSR,  PAC's (episode of AFIB with exercise-RVR) again- Personally Reviewed  ECG     SR - Personally Reviewed  Radiology    No results found.   Cardiac Studies   ECHO 10/28/15 (prior to CABG/AVR)  - EF normal  Patient Profile     70 year old with CABG/AVR([employer] pericardial tissue valve), PAF, hematuria, hypotension  Assessment & Plan    CABG/AVR/aortic root replacement  - stable, no changes  - LIMA to LAD, SVG to first and second OM, SVG to RCA.   Elevated troponin  - 0.41, likely post op/ demand. No signs of ACS  AFIB  - parox (saw during workout with cardiac rehab again)  - [place]  - AMIO continue - he denies nausea this AM. Hopefully as he heals we will see less and less AFIB. Amio is our most efficacious.   - tele shows NSR with occas PAC's  - nausea seems improved  Hypotension  - agree with continued holding ARB/Lasix  -encourage PO, salt  - TED hose  - would not be unreasonable to use Midodrine (discussed with Dr. Algis Liming.   Hematuria  -  Xarelto. Urine "back to normal" he states,  monitor  Fungus in urine  - ID  Signed, Candee Furbish, MD  11/18/2015, 9:41 AM

## 2015-11-18 NOTE — Consult Note (Signed)
Date of Admission:  11/13/2015  Date of Consult:  11/18/2015  Reason for Consult: Yeast seen on UA Referring Physician: Dr. Algis Liming   HPI: [name] is an 70 y.o. male with hx of AS, CAD sp 4 V CABG, AVR admitted recently with dizziness, weakness, nausea and vomiting, volume depletion. He reportedly has dysuria, hematuria, as well with pyuria and urine cx with fairly Klebsiella PNA isolated on culture. He had dark urine again and a UA was performed that showed yeast.   In talking to the patient he denies having any ureteral stents or recent urological surgery. He likely did have a foley catheter during recent hospital stay.  From history his urine analysis does not appear to have been done using "clean catch techniqu."  His only symptoms that appear to have prompted the UA are hematuria which is not something surprising to see on a patient on blood thinners.   He is uncircumcised but has no evidence of fungal balanitis based on history or exam.   Past Medical History:  Diagnosis Date  . 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 Cancer ([place])   . Carpal tunnel syndrome 09/24/2014   Bilateral  . 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 Diabetes mellitus, type 2 ([place])   . Erectile dysfunction   . Essential hypertension   . Hyperlipidemia     Past Surgical History:  Procedure Laterality Date  . AORTIC VALVE REPLACEMENT N/A 10/29/2015   Procedure: AORTIC VALVE REPLACEMENT (AVR) WITH 23MM MAGNA EASE TISSUE VALVE.;  Surgeon: [name], MD;  Location: [facility];  Service: Open Heart Surgery;  Laterality: N/A;  . CARDIAC CATHETERIZATION N/A 10/27/2015   Procedure: Left Heart Cath and Coronary Angiography;  Surgeon: [name], MD;  Location: [facility];  Service: Cardiovascular;  Laterality: N/A;  . CORONARY ARTERY BYPASS GRAFT N/A 10/29/2015   Procedure: CORONARY ARTERY BYPASS GRAFTING (CABG) x Four  UTILIZING THE LEFT INTERNAL MAMMARY ARTERY AND ENDOSCOPICALLY HARVESTED RIGHT SAPEHENEOUS VEINS.;  Surgeon: [name], MD;  Location: [facility];  Service: Open Heart Surgery;  Laterality: N/A;  . CYST REMOVAL HAND Right   . REPLACEMENT ASCENDING AORTA N/A 10/29/2015   Procedure: REPLACEMENT OF ASCENDING AORTA USING 34MM X 30CM WOVEN DOUBLE VELOUR VASCULAR GRAFT.;  Surgeon: [name], MD;  Location: [facility];  Service: Open Heart Surgery;  Laterality: N/A;  . TEE WITHOUT CARDIOVERSION N/A 10/29/2015   Procedure: TRANSESOPHAGEAL ECHOCARDIOGRAM (TEE);  Surgeon: [name], MD;  Location: [facility];  Service: Open Heart Surgery;  Laterality: N/A;    Social History:  reports that he has never smoked. He has never used smokeless tobacco. He reports that he does not drink alcohol or use drugs.   Family History  Problem Relation Age of Onset  . Cancer Father     Throat cancer  . Diabetes Sister     Allergies  Allergen Reactions  . No Known Allergies      Medications: I have reviewed patients current medications as documented in Epic Anti-infectives    Start     Dose/Rate Route Frequency Ordered Stop   11/16/15 1400  metroNIDAZOLE (FLAGYL) tablet 500 mg  Status:  Discontinued     500 mg Oral Every 8 hours 11/16/15 1131 11/17/15 0907   11/16/15 1400  cephALEXin (KEFLEX) capsule 500 mg     500 mg Oral  Every 12 hours 11/16/15 1342 11/19/15 0959   11/13/15 1615  cefTRIAXone (ROCEPHIN) 1 g in dextrose 5 % 50 mL IVPB  Status:  Discontinued     1 g 100 mL/hr over 30 Minutes Intravenous Every 24 hours 11/13/15 1614 11/16/15 1342   11/13/15 1515  cefTRIAXone (ROCEPHIN) 1 g in dextrose 5 % 50 mL IVPB  Status:  Discontinued     1 g 100 mL/hr over 30 Minutes Intravenous Every 24 hours 11/13/15 1503 11/13/15 1614   11/13/15 1445  metroNIDAZOLE (FLAGYL) IVPB 500 mg  Status:  Discontinued     500 mg 100 mL/hr over 60 Minutes Intravenous Every 8 hours 11/13/15 1440 11/16/15 1131          ROS: as in HPI otherwise remainder of 12 point Review of Systems is negative   Blood pressure 118/60, pulse 87, temperature 98.4 F (36.9 C), temperature source Oral, resp. rate 18, height 6' (1.829 m), weight 184 lb 3.2 oz (83.6 kg), SpO2 98 %.    General: Alert and awake, oriented x3, not in any acute distress. HEENT: anicteric sclera,  EOMI, oropharynx clear and without exudate Cardiovascular: egular rate, normal r,  no murmur rubs or gallops Pulmonary: clear to auscultation bilaterally, no wheezing, rales or rhonchi Gastrointestinal: soft nontender, nondistended, normal bowel sounds GU: uncircumcised but no erythema of foreskin or glans Musculoskeletal: no  clubbing or edema noted bilaterally Skin, soft tissue: no rashes Neuro: nonfocal, strength and sensation intact   Results for orders placed or performed during the hospital encounter of 11/13/15 (from the past 48 hour(s))  Glucose, capillary     Status: Abnormal   Collection Time: 11/16/15  9:38 PM  Result Value Ref Range   Glucose-Capillary 160 (H) 65 - 99 mg/dL   Comment 1 Notify RN    Comment 2 Document in Chart   Comprehensive metabolic panel     Status: Abnormal   Collection Time: 11/17/15  2:23 AM  Result Value Ref Range   Sodium 136 135 - 145 mmol/L   Potassium 4.3 3.5 - 5.1 mmol/L   Chloride 103 101 - 111 mmol/L   CO2 28 22 - 32 mmol/L   Glucose, Bld 135 (H) 65 - 99 mg/dL   BUN 6 6 - 20 mg/dL   Creatinine, Ser 0.86 0.61 - 1.24 mg/dL   Calcium 8.8 (L) 8.9 - 10.3 mg/dL   Total Protein 6.4 (L) 6.5 - 8.1 g/dL   Albumin 2.5 (L) 3.5 - 5.0 g/dL   AST 80 (H) 15 - 41 U/L   ALT 51 17 - 63 U/L   Alkaline Phosphatase 91 38 - 126 U/L   Total Bilirubin 0.4 0.3 - 1.2 mg/dL   GFR calc non Af Amer >60 >60 mL/min   GFR calc Af Amer >60 >60 mL/min    Comment: (NOTE) The eGFR has been calculated using the CKD EPI equation. This calculation has not been validated in all clinical situations. eGFR's persistently <60  mL/min signify possible Chronic Kidney Disease.    Anion gap 5 5 - 15  CBC     Status: Abnormal   Collection Time: 11/17/15  2:23 AM  Result Value Ref Range   WBC 8.6 4.0 - 10.5 K/uL   RBC 3.52 (L) 4.22 - 5.81 MIL/uL   Hemoglobin 10.0 (L) 13.0 - 17.0 g/dL   HCT 31.4 (L) 39.0 - 52.0 %   MCV 89.2 78.0 - 100.0 fL   MCH 28.4 26.0 - 34.0 pg  MCHC 31.8 30.0 - 36.0 g/dL   RDW 14.6 11.5 - 15.5 %   Platelets 556 (H) 150 - 400 K/uL  Glucose, capillary     Status: Abnormal   Collection Time: 11/17/15  6:37 AM  Result Value Ref Range   Glucose-Capillary 124 (H) 65 - 99 mg/dL   Comment 1 Notify RN   Glucose, capillary     Status: Abnormal   Collection Time: 11/17/15 11:29 AM  Result Value Ref Range   Glucose-Capillary 161 (H) 65 - 99 mg/dL   Comment 1 Notify RN   Urinalysis, Routine w reflex microscopic (not at [facility])     Status: Abnormal   Collection Time: 11/17/15  1:10 PM  Result Value Ref Range   Color, Urine YELLOW YELLOW   APPearance CLEAR CLEAR   Specific Gravity, Urine 1.018 1.005 - 1.030   pH 6.0 5.0 - 8.0   Glucose, UA NEGATIVE NEGATIVE mg/dL   Hgb urine dipstick LARGE (A) NEGATIVE   Bilirubin Urine NEGATIVE NEGATIVE   Ketones, ur 15 (A) NEGATIVE mg/dL   Protein, ur 30 (A) NEGATIVE mg/dL   Nitrite POSITIVE (A) NEGATIVE   Leukocytes, UA SMALL (A) NEGATIVE  Urine microscopic-add on     Status: Abnormal   Collection Time: 11/17/15  1:10 PM  Result Value Ref Range   Squamous Epithelial / LPF 0-5 (A) NONE SEEN   WBC, UA 6-30 0 - 5 WBC/hpf   RBC / HPF 6-30 0 - 5 RBC/hpf   Bacteria, UA FEW (A) NONE SEEN   Urine-Other YEAST PRESENT     Comment: MUCOUS PRESENT  Glucose, capillary     Status: Abnormal   Collection Time: 11/17/15  4:43 PM  Result Value Ref Range   Glucose-Capillary 142 (H) 65 - 99 mg/dL   Comment 1 Notify RN    Comment 2 Document in Chart   Glucose, capillary     Status: Abnormal   Collection Time: 11/17/15  8:25 PM  Result Value Ref Range    Glucose-Capillary 143 (H) 65 - 99 mg/dL   Comment 1 Notify RN    Comment 2 Document in Chart   Glucose, capillary     Status: Abnormal   Collection Time: 11/18/15  6:19 AM  Result Value Ref Range   Glucose-Capillary 140 (H) 65 - 99 mg/dL   Comment 1 Notify RN    Comment 2 Document in Chart   Glucose, capillary     Status: Abnormal   Collection Time: 11/18/15 11:05 AM  Result Value Ref Range   Glucose-Capillary 231 (H) 65 - 99 mg/dL   Comment 1 Notify RN   Glucose, capillary     Status: Abnormal   Collection Time: 11/18/15  4:39 PM  Result Value Ref Range   Glucose-Capillary 171 (H) 65 - 99 mg/dL   Comment 1 Notify RN    Comment 2 Document in Chart    '@BRIEFLABTABLE' (sdes,specrequest,cult,reptstatus)   ) Recent Results (from the past 720 hour(s))  MRSA PCR Screening     Status: None   Collection Time: 10/28/15 11:16 AM  Result Value Ref Range Status   MRSA by PCR NEGATIVE NEGATIVE Final    Comment:        The GeneXpert MRSA Assay (FDA approved for NASAL specimens only), is one component of a comprehensive MRSA colonization surveillance program. It is not intended to diagnose MRSA infection nor to guide or monitor treatment for MRSA infections.   Surgical pcr screen     Status: None  Collection Time: 10/28/15 10:56 PM  Result Value Ref Range Status   MRSA, PCR NEGATIVE NEGATIVE Final   Staphylococcus aureus NEGATIVE NEGATIVE Final    Comment:        The Xpert SA Assay (FDA approved for NASAL specimens in patients over 70 years of age), is one component of a comprehensive surveillance program.  Test performance has been validated by [facility] for patients greater than or equal to 47 year old. It is not intended to diagnose infection nor to guide or monitor treatment.   Urine culture     Status: Abnormal   Collection Time: 11/13/15  2:17 PM  Result Value Ref Range Status   Specimen Description URINE, RANDOM  Final   Special Requests NONE  Final   Culture  >=100,000 COLONIES/mL KLEBSIELLA PNEUMONIAE (A)  Final   Report Status 11/15/2015 FINAL  Final   Organism ID, Bacteria KLEBSIELLA PNEUMONIAE (A)  Final      Susceptibility   Klebsiella pneumoniae - MIC*    AMPICILLIN >=32 RESISTANT Resistant     CEFAZOLIN <=4 SENSITIVE Sensitive     CEFTRIAXONE <=1 SENSITIVE Sensitive     CIPROFLOXACIN <=0.25 SENSITIVE Sensitive     GENTAMICIN <=1 SENSITIVE Sensitive     IMIPENEM <=0.25 SENSITIVE Sensitive     NITROFURANTOIN 32 SENSITIVE Sensitive     TRIMETH/SULFA <=20 SENSITIVE Sensitive     AMPICILLIN/SULBACTAM 4 SENSITIVE Sensitive     PIP/TAZO <=4 SENSITIVE Sensitive     Extended ESBL NEGATIVE Sensitive     * >=100,000 COLONIES/mL KLEBSIELLA PNEUMONIAE  C difficile quick scan w PCR reflex     Status: None   Collection Time: 11/13/15  2:25 PM  Result Value Ref Range Status   C Diff antigen NEGATIVE NEGATIVE Final   C Diff toxin NEGATIVE NEGATIVE Final   C Diff interpretation No C. difficile detected.  Final  Gastrointestinal Panel by PCR , Stool     Status: None   Collection Time: 11/13/15  2:25 PM  Result Value Ref Range Status   Campylobacter species NOT DETECTED NOT DETECTED Final   Plesimonas shigelloides NOT DETECTED NOT DETECTED Final   Salmonella species NOT DETECTED NOT DETECTED Final   Yersinia enterocolitica NOT DETECTED NOT DETECTED Final   Vibrio species NOT DETECTED NOT DETECTED Final   Vibrio cholerae NOT DETECTED NOT DETECTED Final   Enteroaggregative E coli (EAEC) NOT DETECTED NOT DETECTED Final   Enteropathogenic E coli (EPEC) NOT DETECTED NOT DETECTED Final   Enterotoxigenic E coli (ETEC) NOT DETECTED NOT DETECTED Final   Shiga like toxin producing E coli (STEC) NOT DETECTED NOT DETECTED Final   Shigella/Enteroinvasive E coli (EIEC) NOT DETECTED NOT DETECTED Final   Cryptosporidium NOT DETECTED NOT DETECTED Final   Cyclospora cayetanensis NOT DETECTED NOT DETECTED Final   Entamoeba histolytica NOT DETECTED NOT DETECTED  Final   Giardia lamblia NOT DETECTED NOT DETECTED Final   Adenovirus F40/41 NOT DETECTED NOT DETECTED Final   Astrovirus NOT DETECTED NOT DETECTED Final   Norovirus GI/GII NOT DETECTED NOT DETECTED Final   Rotavirus A NOT DETECTED NOT DETECTED Final   Sapovirus (I, II, IV, and V) NOT DETECTED NOT DETECTED Final  Culture, blood (Routine X 2) w Reflex to ID Panel     Status: None   Collection Time: 11/13/15  4:35 PM  Result Value Ref Range Status   Specimen Description BLOOD BLOOD RIGHT HAND  Final   Special Requests IN PEDIATRIC BOTTLE [facility]  Final   Culture  NO GROWTH 5 DAYS  Final   Report Status 11/18/2015 FINAL  Final  Culture, blood (Routine X 2) w Reflex to ID Panel     Status: None   Collection Time: 11/13/15  4:49 PM  Result Value Ref Range Status   Specimen Description BLOOD RIGHT ANTECUBITAL  Final   Special Requests IN PEDIATRIC BOTTLE [facility]  Final   Culture NO GROWTH 5 DAYS  Final   Report Status 11/18/2015 FINAL  Final     Impression/Recommendation  Principal Problem:   Orthostatic syncope Active Problems:   Type II diabetes mellitus with neurological manifestations, uncontrolled ([facility])   Hyperlipidemia   Essential hypertension   Aortic stenosis s/p tissue valve (Sept 2017)   Hx of CABG (Sept 2017)   Atrial fibrillation (post op after CABG Sept 2017)   Nausea vomiting and diarrhea   Hematuria, gross   Dehydration with hyponatremia   Elevated troponin   Near syncope   Orthostatic hypotension   [name] is a 70 y.o. male with recent CABG, AVR admitted with UTI on treatment now with yeast seen on UA  #1 Yeast on UA:   This is NOT a significant finding  He has zero symptoms of UTI at present  The urine was not obtained by clean catch and asymptomatic funguria does not require treatment other than to remove a catheter in patients who have one  #2 UTI: finish 7 day course of therapy and can step down to oral therapy  #3 Screening: will screen for HIV,  HCV  I will sign off please call with further questions.     11/18/2015, 7:15 PM   Thank you so much for this interesting consult  [place] [facility] [place] [phone] (pager) [phone] (office) 11/18/2015, 7:15 PM  [place] 11/18/2015, 7:15 PM

## 2015-11-19 LAB — GLUCOSE, CAPILLARY
GLUCOSE-CAPILLARY: 218 mg/dL — AB (ref 65–99)
Glucose-Capillary: 164 mg/dL — ABNORMAL HIGH (ref 65–99)
Glucose-Capillary: 244 mg/dL — ABNORMAL HIGH (ref 65–99)
Glucose-Capillary: 125 mg/dL — ABNORMAL HIGH (ref 65–99)

## 2015-11-19 LAB — HIV ANTIBODY (ROUTINE TESTING W REFLEX): HIV Screen 4th Generation wRfx: NONREACTIVE

## 2015-11-19 MED ORDER — MIDODRINE HCL 5 MG PO TABS
10.0000 mg | ORAL_TABLET | Freq: Three times a day (TID) | ORAL | Status: DC
  Administered 2015-11-19 – 2015-11-20 (×4): 10 mg via ORAL
  Filled 2015-11-19 (×4): qty 2

## 2015-11-19 MED ORDER — INSULIN ASPART 100 UNIT/ML ~~LOC~~ SOLN
4.0000 [IU] | Freq: Three times a day (TID) | SUBCUTANEOUS | Status: DC
  Administered 2015-11-19 – 2015-11-20 (×3): 4 [IU] via SUBCUTANEOUS

## 2015-11-19 NOTE — Progress Notes (Signed)
PROGRESS NOTE  [name]  [id number] DOB: 1945-10-11  DOA: 11/13/2015 PCP: Drema Pry, DO   Brief Narrative:  70 year old male with PMH of aortic stenosis, CAD, type II DM, essential hypertension, hyperlipidemia, recently discharged from the hospital on 11/08/15 status post 4 vessel CABG and AVR ([employer] pericardial tissue valve) for management of CAD and aortic stenosis. Postoperative course was complicated by PAF which was treated with amiodarone and Xarelto. He was now readmitted with 2-3 days history of nausea, nonbloody emesis, diarrhea, orthostatic hypotension, hematuria and near syncope.   Assessment & Plan:   Principal Problem:   Orthostatic syncope Active Problems:   Type II diabetes mellitus with neurological manifestations, uncontrolled ([place])   Hyperlipidemia   Essential hypertension   Aortic stenosis s/p tissue valve (Sept 2017)   Hx of CABG (Sept 2017)   Atrial fibrillation (post op after CABG Sept 2017)   Nausea vomiting and diarrhea   Hematuria, gross   Dehydration with hyponatremia   Elevated troponin   Near syncope   Orthostatic hypotension   Difficulty in walking, not elsewhere classified   Fungus present in urine   Acute cystitis with hematuria   Near syncope, likely related to hypotension/orthostatic hypotension - Due to several days' history of nausea, vomiting and diarrhea + medications - As per discussion with therapy, continued orthostatic hypotension and symptomatic of same. On 11/18/15, SBP dropped from 125 supine >78 mmHg standing. - TED hose placed by TCTS - As discussed with Dr. Servando Snare and Dr. Marlou Porch, trial of midodrine >started 5 MG 3 times a day on 10/5. Remains significantly orthostatic and hence increased to 10 MG 3 times a day on 10/6. Holding ARB and diuretics. - ? Orthostatic hypotension related to recent CABG.  Nausea, vomiting and diarrhea - Unclear etiology.? Acute viral GE. GI pathogen panel PCR and C. difficile  testing negative. Empirically started IV metronidazole was discontinued on 10/4. KUB without obstruction. - Resolved. Monitor.  Klebsiella pneumoniae UTI/hematuria - Patient had terminal dysuria and hematuria on admission. Patient transitioned from IV ceftriaxone to oral Keflex. Complete total 7 days treatment. - Patient complains of "tea-colored" urine and urine microscopy still has hematuria. This will need to be followed up to resolution or will need evaluation if persists, possibly as outpatient. - Repeat urine microscopy 10/4 shows yeast-infectious disease input appreciated and no need to treat.  Dehydration with hyponatremia -Resolved.   Uncontrolled DM 2 - Hemoglobin A1c recently: 11.2. Metformin held due to current acute illness. Placed on reduced dose Lantus and SSI. -Reasonable inpatient control.  Essential hypertension - Controlled. Orthostatic changes. Started midodrine 10/5 and titrated up to 10 MG 3 times a day today. Change lower extremity TED hoses to thigh high.  Paroxysmal A. Fib - Paroxysmal seen during workout with cardiac rehabilitation. Cardiology follow-up appreciated. Continue amiodarone and Xarelto. - Patient having brief episodes of A. fib with RVR. As per cardiology follow-up, not adding beta blocker secondary to significant orthostatic hypotension.  Mildly elevated troponin - Likely postop/demand ischemia. No signs of acute coronary syndrome. Cardiology on board.  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  -  LIMA to LAD, SVG to first and second OM, SVG to RCA. - Stable.    DVT prophylaxis: On Xarelto Code Status: Full Family Communication: Discussed with patient's son at bedside. Disposition Plan: DC home when medically ready. Patient not medically stable for discharge given episodic A. fib with  RVR associated with activity and symptomatic orthostatic  hypotension.   Consultants:   Cardiothoracic surgery  Cardiology  Procedures:   None   Antimicrobials:   IV ceftriaxone 9/30 > 10/2    IV cephalexin 10/3 >  Metronidazole 9/30 > 10/3   Subjective: States that he ambulated couple times since yesterday and has not felt much dizzy. Blood pressure this morning dropped from SBP 129 > 89 from supine to standing.  Objective:  Vitals:   11/18/15 2102 11/19/15 0458 11/19/15 0501 11/19/15 1354  BP: (!) 120/54   108/65  Pulse: 76 78  80  Resp: 18 18  18   Temp: 99.1 F (37.3 C) 98.2 F (36.8 C)  98.5 F (36.9 C)  TempSrc: Oral Oral  Oral  SpO2: 100%   97%  Weight:   83.8 kg (184 lb 12.8 oz)   Height:        Intake/Output Summary (Last 24 hours) at 11/19/15 1455 Last data filed at 11/19/15 1252  Gross per 24 hour  Intake              816 ml  Output              775 ml  Net               41 ml   Filed Weights   11/17/15 0440 11/18/15 0507 11/19/15 0501  Weight: 84.3 kg (185 lb 14.4 oz) 83.6 kg (184 lb 3.2 oz) 83.8 kg (184 lb 12.8 oz)    Examination:  General exam: Pleasant middle-aged male sitting comfortably in chair. Does not look septic or toxic.  Respiratory system: Clear to auscultation. Respiratory effort normal. Healed midline sternotomy scar.  Cardiovascular system: S1 & S2 heard, RRR. No JVD, murmurs, rubs, gallops or clicks. No pedal edema. telemetry: Sinus rhythm. Occasional A. fib with RVR in the 130s this morning. Gastrointestinal system: Abdomen is nondistended, soft and nontender. No organomegaly or masses felt. Normal bowel sounds heard. Central nervous system: Alert and oriented. No focal neurological deficits. Extremities: Symmetric 5 x 5 power. Bilateral knee-high TED hose. Skin: No rashes, lesions or ulcers Psychiatry: Judgement and insight appear normal. Mood & affect appropriate.     Data Reviewed: I have personally reviewed following labs and imaging studies  CBC:  Recent Labs Lab  11/13/15 1051 11/14/15 0917 11/14/15 1906 11/15/15 0525 11/16/15 0149 11/17/15 0223  WBC 15.5* 9.4 7.3 7.7 9.0 8.6  NEUTROABS 12.9*  --  4.8  --   --   --   HGB 10.2* 9.5* 9.5* 9.6* 9.8* 10.0*  HCT 31.0* 30.0* 29.9* 30.7* 30.8* 31.4*  MCV 89.9 91.2 90.3 89.8 89.3 89.2  PLT 559* 620* 521* 581* 581* [id number]*   Basic Metabolic Panel:  Recent Labs Lab 11/13/15 1051 11/14/15 0917 11/15/15 0525 11/16/15 0149 11/17/15 0223  NA 132* 137 137 134* 136  K 4.4 3.9 4.3 4.0 4.3  CL 95* 103 105 99* 103  CO2 29 25 25 27 28   GLUCOSE 190* 138* 122* 119* 135*  BUN 16 8 <5* <5* 6  CREATININE 1.09 0.92 0.78 0.77 0.86  CALCIUM 8.6* 8.5* 8.7* 8.7* 8.8*   GFR: Estimated Creatinine Clearance: 90.2 mL/min (by C-G formula based on SCr of 0.86 mg/dL). Liver Function Tests:  Recent Labs Lab 11/13/15 1051 11/14/15 0917 11/17/15 0223  AST 36 50* 80*  ALT 37 43 51  ALKPHOS 95 85 91  BILITOT 0.9 1.1 0.4  PROT 6.9 6.7 6.4*  ALBUMIN 2.7* 2.7*  2.5*   No results for input(s): LIPASE, AMYLASE in the last 168 hours. No results for input(s): AMMONIA in the last 168 hours. Coagulation Profile: No results for input(s): INR, PROTIME in the last 168 hours. Cardiac Enzymes:  Recent Labs Lab 11/13/15 1051 11/13/15 1633 11/13/15 2352 11/14/15 0917  TROPONINI 0.15* 0.41* 0.10* 0.12*   BNP (last 3 results) No results for input(s): PROBNP in the last 8760 hours. HbA1C: No results for input(s): HGBA1C in the last 72 hours. CBG:  Recent Labs Lab 11/18/15 1105 11/18/15 1639 11/18/15 2056 11/19/15 0605 11/19/15 1116  GLUCAP 231* 171* 181* 164* 244*   Lipid Profile: No results for input(s): CHOL, HDL, LDLCALC, TRIG, CHOLHDL, LDLDIRECT in the last 72 hours. Thyroid Function Tests: No results for input(s): TSH, T4TOTAL, FREET4, T3FREE, THYROIDAB in the last 72 hours. Anemia Panel: No results for input(s): VITAMINB12, FOLATE, FERRITIN, TIBC, IRON, RETICCTPCT in the last 72 hours.  Sepsis  Labs:  Recent Labs Lab 11/13/15 1428 11/13/15 1442 11/13/15 1915  PROCALCITON 0.39  --   --   LATICACIDVEN  --  1.2 1.3    Recent Results (from the past 240 hour(s))  Urine culture     Status: Abnormal   Collection Time: 11/13/15  2:17 PM  Result Value Ref Range Status   Specimen Description URINE, RANDOM  Final   Special Requests NONE  Final   Culture >=100,000 COLONIES/mL KLEBSIELLA PNEUMONIAE (A)  Final   Report Status 11/15/2015 FINAL  Final   Organism ID, Bacteria KLEBSIELLA PNEUMONIAE (A)  Final      Susceptibility   Klebsiella pneumoniae - MIC*    AMPICILLIN >=32 RESISTANT Resistant     CEFAZOLIN <=4 SENSITIVE Sensitive     CEFTRIAXONE <=1 SENSITIVE Sensitive     CIPROFLOXACIN <=0.25 SENSITIVE Sensitive     GENTAMICIN <=1 SENSITIVE Sensitive     IMIPENEM <=0.25 SENSITIVE Sensitive     NITROFURANTOIN 32 SENSITIVE Sensitive     TRIMETH/SULFA <=20 SENSITIVE Sensitive     AMPICILLIN/SULBACTAM 4 SENSITIVE Sensitive     PIP/TAZO <=4 SENSITIVE Sensitive     Extended ESBL NEGATIVE Sensitive     * >=100,000 COLONIES/mL KLEBSIELLA PNEUMONIAE  C difficile quick scan w PCR reflex     Status: None   Collection Time: 11/13/15  2:25 PM  Result Value Ref Range Status   C Diff antigen NEGATIVE NEGATIVE Final   C Diff toxin NEGATIVE NEGATIVE Final   C Diff interpretation No C. difficile detected.  Final  Gastrointestinal Panel by PCR , Stool     Status: None   Collection Time: 11/13/15  2:25 PM  Result Value Ref Range Status   Campylobacter species NOT DETECTED NOT DETECTED Final   Plesimonas shigelloides NOT DETECTED NOT DETECTED Final   Salmonella species NOT DETECTED NOT DETECTED Final   Yersinia enterocolitica NOT DETECTED NOT DETECTED Final   Vibrio species NOT DETECTED NOT DETECTED Final   Vibrio cholerae NOT DETECTED NOT DETECTED Final   Enteroaggregative E coli (EAEC) NOT DETECTED NOT DETECTED Final   Enteropathogenic E coli (EPEC) NOT DETECTED NOT DETECTED Final    Enterotoxigenic E coli (ETEC) NOT DETECTED NOT DETECTED Final   Shiga like toxin producing E coli (STEC) NOT DETECTED NOT DETECTED Final   Shigella/Enteroinvasive E coli (EIEC) NOT DETECTED NOT DETECTED Final   Cryptosporidium NOT DETECTED NOT DETECTED Final   Cyclospora cayetanensis NOT DETECTED NOT DETECTED Final   Entamoeba histolytica NOT DETECTED NOT DETECTED Final   Giardia lamblia NOT DETECTED  NOT DETECTED Final   Adenovirus F40/41 NOT DETECTED NOT DETECTED Final   Astrovirus NOT DETECTED NOT DETECTED Final   Norovirus GI/GII NOT DETECTED NOT DETECTED Final   Rotavirus A NOT DETECTED NOT DETECTED Final   Sapovirus (I, II, IV, and V) NOT DETECTED NOT DETECTED Final  Culture, blood (Routine X 2) w Reflex to ID Panel     Status: None   Collection Time: 11/13/15  4:35 PM  Result Value Ref Range Status   Specimen Description BLOOD BLOOD RIGHT HAND  Final   Special Requests IN PEDIATRIC BOTTLE 2CC  Final   Culture NO GROWTH 5 DAYS  Final   Report Status 11/18/2015 FINAL  Final  Culture, blood (Routine X 2) w Reflex to ID Panel     Status: None   Collection Time: 11/13/15  4:49 PM  Result Value Ref Range Status   Specimen Description BLOOD RIGHT ANTECUBITAL  Final   Special Requests IN PEDIATRIC BOTTLE 2CC  Final   Culture NO GROWTH 5 DAYS  Final   Report Status 11/18/2015 FINAL  Final         Radiology Studies: No results found.      Scheduled Meds: . amiodarone  200 mg Oral BID  . atorvastatin  40 mg Oral q1800  . insulin aspart  0-9 Units Subcutaneous TID WC  . insulin glargine  20 Units Subcutaneous Q2200  . midodrine  10 mg Oral TID WC  . rivaroxaban  20 mg Oral Q supper  . saccharomyces boulardii  250 mg Oral BID   Continuous Infusions:    LOS: 5 days       [facility], MD [facility] Pager [phone]  If 7PM-7AM, please contact night-coverage [email or web address] Password [email or web address] 11/19/2015, 2:55 PM

## 2015-11-19 NOTE — Progress Notes (Signed)
CARDIAC REHAB PHASE I   PRE:  Rate/Rhythm: 82 SR  BP:  Sitting: 93/47        SaO2: 100 RA  MODE:  Ambulation: 500 ft   POST:  Rate/Rhythm: 150 a fib (decreased to 79 SR after 5 minutes rest)  BP:  Sitting: 108/78         SaO2: 100 RA  Pt ambulated 500 ft on RA, rollator, gait belt, assist x1, steady gait, tolerated well with no complaints, pt requested to increase distance today. Pt HR elevated 150s a fib towards end of walk, pt asymptomatic, declined rest stop. Pt quickly returned to sinus rhythm upon return to room. Encouraged IS, additional ambulation x2 today. Pt interested in rollator for home. Pt to recliner after walk, feet elevated, call bell within reach. Will follow.  YI:[id number] Lenna Sciara, RN, BSN 11/19/2015 10:47 AM

## 2015-11-19 NOTE — Progress Notes (Addendum)
Inpatient Diabetes Program Recommendations  AACE/ADA: New Consensus Statement on Inpatient Glycemic Control (2015)  Target Ranges:  Prepandial:   less than 140 mg/dL      Peak postprandial:   less than 180 mg/dL (1-2 hours)      Critically ill patients:  140 - 180 mg/dL   Lab Results  Component Value Date   GLUCAP 164 (H) 11/19/2015   HGBA1C 11.2 (H) 10/29/2015    Review of Glycemic ControlResults for PRINCETYN, LARICK (MRN [id number]) as of 11/19/2015 09:10  Ref. Range 11/18/2015 06:19 11/18/2015 11:05 11/18/2015 16:39 11/18/2015 20:56 11/19/2015 06:05  Glucose-Capillary Latest Ref Range: 65 - 99 mg/dL 140 (H) 231 (H) 171 (H) 181 (H) 164 (H)    Diabetes history: Type 2 diabetes Outpatient Diabetes medications: Lantus 25 units q HS, Humalog 5-10 units tid with meals  Current orders for Inpatient glycemic control:  Lantus 20 units q HS, Novolog sensitive tid with meals and HS  Inpatient Diabetes Program Recommendations:    Please consider adding Novolog meal coverage 5 units tid with meals (hold if patient eats less than 50%).  Thanks, Adah Perl, RN, BC-ADM Inpatient Diabetes Coordinator Pager [phone] (8a-5p)

## 2015-11-19 NOTE — Care Management Important Message (Signed)
Important Message  Patient Details  Name: [name] MRN: [id number] Date of Birth: Apr 20, 1945   Medicare Important Message Given:  Yes    Nathen May 11/19/2015, 1:03 PM

## 2015-11-19 NOTE — Progress Notes (Signed)
Patient Name: [name] Date of Encounter: 11/19/2015  Primary Cardiologist: Dr. Elroy Channel Problem List     Principal Problem:   Orthostatic syncope Active Problems:   Type II diabetes mellitus with neurological manifestations, uncontrolled ([place])   Hyperlipidemia   Essential hypertension   Aortic stenosis s/p tissue valve (Sept 2017)   Hx of CABG (Sept 2017)   Atrial fibrillation (post op after CABG Sept 2017)   Nausea vomiting and diarrhea   Hematuria, gross   Dehydration with hyponatremia   Elevated troponin   Near syncope   Orthostatic hypotension   Difficulty in walking, not elsewhere classified   Fungus present in urine   Acute cystitis with hematuria     Subjective   PAF at 10 pm last night. Feels mild dizziness when walking with rehab. No longer having nausea.   Inpatient Medications    Scheduled Meds: . amiodarone  200 mg Oral BID  . atorvastatin  40 mg Oral q1800  . insulin aspart  0-9 Units Subcutaneous TID WC  . insulin glargine  20 Units Subcutaneous Q2200  . midodrine  5 mg Oral TID WC  . rivaroxaban  20 mg Oral Q supper  . saccharomyces boulardii  250 mg Oral BID   Continuous Infusions:   PRN Meds:.acetaminophen **OR** [DISCONTINUED] acetaminophen   Vital Signs    Vitals:   11/18/15 1457 11/18/15 2102 11/19/15 0458 11/19/15 0501  BP: 118/60 (!) 120/54    Pulse: 87 76 78   Resp: 18 18 18    Temp: 98.4 F (36.9 C) 99.1 F (37.3 C) 98.2 F (36.8 C)   TempSrc: Oral Oral Oral   SpO2: 98% 100%    Weight:    184 lb 12.8 oz (83.8 kg)  Height:        Intake/Output Summary (Last 24 hours) at 11/19/15 0652 Last data filed at 11/18/15 2340  Gross per 24 hour  Intake              600 ml  Output              826 ml  Net             -226 ml   Filed Weights   11/17/15 0440 11/18/15 0507 11/19/15 0501  Weight: 185 lb 14.4 oz (84.3 kg) 184 lb 3.2 oz (83.6 kg) 184 lb 12.8 oz (83.8 kg)    Physical Exam    GEN: Well  nourished, well developed, in no acute distress.  HEENT: Grossly normal.  Neck: Supple, no JVD, carotid bruits, or masses. Cardiac: RRR, no murmurs, rubs, or gallops. No clubbing, cyanosis, trace BLE edema.  Radials/DP/PT 2+ and equal bilaterally. CABG scar Respiratory:  Respirations regular and unlabored, clear to auscultation bilaterally. GI: Soft, nontender, nondistended, BS + x 4. MS: no deformity or atrophy. Skin: warm and dry, no rash. Neuro:  Strength and sensation are intact. Psych: AAOx3.  Normal affect.  Labs    CBC  Recent Labs  11/17/15 0223  WBC 8.6  HGB 10.0*  HCT 31.4*  MCV 89.2  PLT [id number]*   Basic Metabolic Panel  Recent Labs  11/17/15 0223  NA 136  K 4.3  CL 103  CO2 28  GLUCOSE 135*  BUN 6  CREATININE 0.86  CALCIUM 8.8*   Liver Function Tests  Recent Labs  11/17/15 0223  AST 80*  ALT 51  ALKPHOS 91  BILITOT 0.4  PROT 6.4*  ALBUMIN 2.5*  Telemetry    AFIB RVR at 10pm 10/5- Personally Reviewed  ECG     SR - Personally Reviewed  Radiology    No results found.   Cardiac Studies   ECHO 10/28/15 (prior to CABG/AVR)  - EF normal  Patient Profile     70 year old with CABG/AVR([employer] pericardial tissue valve), PAF, hematuria, hypotension  Assessment & Plan    CABG/AVR/aortic root replacement  - stable, no changes  - LIMA to LAD, SVG to first and second OM, SVG to RCA.   Elevated troponin  - 0.41, likely post op/ demand. No signs of ACS  AFIB  - parox, still having episodes despite amiodarone  - Xarelto  - AMIO. Hopefully as he heals we will see less and less AFIB. Amio is our most efficacious.   - nausea seems improved  - do not feel comfortable adding Bb secondary to significant orthostatic hypotension  Orthostatic Hypotension  - agree with continued holding ARB/Lasix  - BP still dropping to 80's  - encourage PO, salt  - TED hose  - would not be unreasonable to use Midodrine (discussed with Dr.  Algis Liming.   Hematuria  -  Xarelto. Urine "back to normal" he states, monitor  Fungus in urine  - ID note reviewed, not clean catch. NOT a significant finding.   Signed, Candee Furbish, MD  11/19/2015, 6:52 AM

## 2015-11-20 DIAGNOSIS — B49 Unspecified mycosis: Secondary | ICD-10-CM

## 2015-11-20 LAB — GLUCOSE, CAPILLARY
GLUCOSE-CAPILLARY: 153 mg/dL — AB (ref 65–99)
GLUCOSE-CAPILLARY: 183 mg/dL — AB (ref 65–99)

## 2015-11-20 LAB — HCV COMMENT:

## 2015-11-20 LAB — HEPATITIS C ANTIBODY (REFLEX): HCV Ab: <0.1 s/co ratio (ref 0.0–0.9)

## 2015-11-20 MED ORDER — MIDODRINE HCL 10 MG PO TABS: 10.0000 mg | ORAL_TABLET | Freq: Three times a day (TID) | ORAL | 0 refills | Status: DC

## 2015-11-20 NOTE — Progress Notes (Signed)
Discussed with the patient and all questioned fully answered. He will call me if any problems arise. Pt wife and family at bedside for discharge teaching. Pt educated on follow up appointments, new medications, how to prevent near-syncopal episodes. Pt instructed to pick Midodrine up at pharmacy. 3 in 1 delivered to pt prior to discharge.   IV removed. Telemetry removed, CCMD notified.    Fritz Pickerel, RN

## 2015-11-20 NOTE — Progress Notes (Signed)
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 No chest pain or sob.   CURRENT MEDS . amiodarone  200 mg Oral BID  . atorvastatin  40 mg Oral q1800  . insulin aspart  0-9 Units Subcutaneous TID WC  . insulin aspart  4 Units Subcutaneous TID WC  . insulin glargine  20 Units Subcutaneous Q2200  . midodrine  10 mg Oral TID WC  . rivaroxaban  20 mg Oral Q supper  . saccharomyces boulardii  250 mg Oral BID    OBJECTIVE  Vitals:   11/19/15 1354 11/19/15 1722 11/19/15 2000 11/20/15 0505  BP: 108/65 136/73 (!) 116/57 121/60  Pulse: 80 (!) 120 76 76  Resp: 18  18 18   Temp: 98.5 F (36.9 C)  99.2 F (37.3 C) 98.6 F (37 C)  TempSrc: Oral  Oral Oral  SpO2: 97% 96% 98% 95%  Weight:    179 lb 11.2 oz (81.5 kg)  Height:        Intake/Output Summary (Last 24 hours) at 11/20/15 1036 Last data filed at 11/20/15 0511  Gross per 24 hour  Intake              580 ml  Output             1225 ml  Net             -645 ml   Filed Weights   11/18/15 0507 11/19/15 0501 11/20/15 0505  Weight: 184 lb 3.2 oz (83.6 kg) 184 lb 12.8 oz (83.8 kg) 179 lb 11.2 oz (81.5 kg)    PHYSICAL EXAM  General: Pleasant, NAD. Neuro: Alert and oriented X 3. Moves all extremities spontaneously. Psych: Normal affect. HEENT:  Normal  Neck: Supple without bruits or JVD. Lungs:  Resp  regular and unlabored, CTA. Heart: RRR no s3, s4, or murmurs. Abdomen: Soft, non-tender, non-distended, BS + x 4.  Extremities: No clubbing, cyanosis or edema. DP/PT/Radials 2+ and equal bilaterally.  Accessory Clinical Findings  CBC No results for input(s): WBC, NEUTROABS, HGB, HCT, MCV, PLT in the last 72 hours. Basic Metabolic Panel No results for input(s): NA, K, CL, CO2, GLUCOSE, BUN, CREATININE, CALCIUM, MG, PHOS in the last 72 hours. Liver Function Tests No results for input(s): AST, ALT, ALKPHOS, BILITOT, PROT, ALBUMIN in the last 72 hours. No results for input(s): LIPASE, AMYLASE in the last 72 hours. Cardiac Enzymes No results for input(s): CKTOTAL, CKMB, CKMBINDEX, TROPONINI in the last 72 hours. BNP Invalid input(s): POCBNP D-Dimer No results for input(s): DDIMER in the last 72 hours. Hemoglobin A1C No results for input(s): HGBA1C in the last 72 hours. Fasting Lipid Panel No results for input(s): CHOL, HDL, LDLCALC, TRIG, CHOLHDL, LDLDIRECT in the last 72 hours. Thyroid Function Tests No results for input(s): TSH, T4TOTAL, T3FREE, THYROIDAB in the last 72 hours.  Invalid input(s): FREET3  TELE  nsr  Radiology/Studies  Dg Chest 1 View  Result Date: 11/05/2015 CLINICAL DATA:  Status post thoracentesis on the right EXAM: CHEST 1 VIEW COMPARISON:  November 04, 2015 FINDINGS: No pneumothorax evident. There has been interval resolution of pleural effusion on the right. A small left pleural effusion remains. There is bibasilar atelectatic change with patchy airspace consolidation in the left base. Heart is borderline enlarged with pulmonary vascularity within normal limits. Patient is status post coronary artery bypass grafting and aortic valve replacement. No bone lesions. No demonstrable adenopathy. There is an old healed fracture of the left clavicle. IMPRESSION: No pneumothorax following thoracentesis on the right. Interval resolution of right pleural effusion. Small  persistent left pleural effusion. Persistent left lower lobe region consolidation. Stable cardiac silhouette. Electronically Signed   By: Lowella Grip III M.D.   On: 11/05/2015 15:23   Dg Chest 2 View  Result Date: 11/14/2015 CLINICAL DATA:  Medical hx: cancer, diabetes, hypertension. Patient is lightheaded with limited mobility. EXAM: CHEST  2 VIEW COMPARISON:  11/06/2015 FINDINGS: The cardiac silhouette is mildly enlarged. Changes from cardiac surgery and valve replacement are stable. No mediastinal or hilar masses.  No convincing adenopathy. There is left greater than right lung base opacity mildly improved from the prior study. This may reflect pneumonia, atelectasis or a combination. Minimal left pleural effusion. No pneumothorax. Remainder of the lungs is clear. IMPRESSION: 1. Mild improvement in left greater than right lung base opacity. Decreased left pleural fluid. Residual lung base opacity is likely atelectasis. Pneumonia is possible. 2. No pulmonary edema.  No new abnormalities. Electronically Signed   By: Lajean Manes M.D.   On: 11/14/2015 09:27   Dg Chest 2 View  Result Date: 11/06/2015 CLINICAL DATA:  Pt states he had 2 valves replacements and an aortic aneurysm repair last week. Pt has no other symptoms or complaints and no other heart problems. Pt has diabetes. Pleural effusion, right EXAM: CHEST  2 VIEW COMPARISON:  11/05/2015 FINDINGS: Changes from cardiac surgery and valve replacement are stable. Cardiac silhouette is normal in size. No mediastinal or hilar masses. There is left greater than right lung base opacity. On the left, this is likely due to a combination of a small pleural effusion with atelectasis. The right lung base opacity is likely atelectasis. Is no overt pulmonary edema. No pneumothorax. IMPRESSION: 1. Bibasilar lung opacity is similar to the most recent prior exam. This is consistent with a combination a left pleural effusion and bilateral lung base atelectasis. 2.  No overt pulmonary edema.  No pneumothorax. Electronically Signed   By: Lajean Manes M.D.   On: 11/06/2015 13:24   Dg Chest 2 View  Result Date: 11/04/2015 CLINICAL DATA:  Hypoxia, dizziness EXAM: CHEST  2 VIEW COMPARISON:  11/02/2015 FINDINGS: Status post median sternotomy and cardiac valve replacement. Cardiomediastinal silhouette is stable. Stable title again noted some right perihilar scarring. Bilateral small pleural effusion with bilateral basilar atelectasis or infiltrate. No pulmonary edema. IMPRESSION: No pulmonary edema. Bilateral small pleural effusion with bilateral basilar atelectasis or infiltrate. Electronically Signed   By: Lahoma Crocker M.D.   On: 11/04/2015 12:51   Dg Chest 2 View  Result Date: 11/02/2015 CLINICAL DATA:  Cardiac surgery. EXAM: CHEST  2 VIEW COMPARISON:  Nineteen 2017. FINDINGS: Prior CABG. Cardiac valve replacement. Stable cardiomegaly. Persistent bibasilar atelectasis and or infiltrate. Poor inspiration on lateral view. Bilateral pleural effusions cannot be excluded. IMPRESSION: 1. Prior CABG. Prior cardiac valve replacement.  Stable cardiomegaly. No pulmonary venous congestion. 2. Stable bibasilar atelectasis and/or mild infiltrates. Poor inspiration on lateral view. Bilateral pleural effusions cannot be excluded. Electronically Signed   By: Marcello Moores  Register   On: 11/02/2015 07:36   Dg Chest 2 View  Result Date: 10/28/2015 CLINICAL DATA:  Preop chest exam for CABG. No current chest complaints. History of recent cardiac catheterization. EXAM: CHEST  2 VIEW COMPARISON:  04/03/2014 FINDINGS: The heart size and mediastinal contours are within normal limits. No acute pulmonary infiltrate, consolidation, or effusion. Cardiomediastinal silhouette stable. Small faint nodular opacities are seen in the left upper lobe and could relate to pulmonary vessels on end. No pneumothorax. Atherosclerotic calcifications in the aorta. Probable old left mid clavicular fracture. IMPRESSION: 1.  No radiographic evidence for acute cardiopulmonary abnormality 2. Faint nodular opacities in the left upper lobe, possible pulmonary vessels on end versus small nodules. Suggest radiographic follow-up. Electronically Signed   By: Donavan Foil M.D.   On: 10/28/2015 12:35   Dg Abd 1 View  Result Date: 11/14/2015 CLINICAL DATA:  Medical hx: cancer, diabetes, hypertension. Patient is lightheaded with limited mobility. EXAM: ABDOMEN - 1 VIEW COMPARISON:  None. FINDINGS: Normal bowel gas pattern. No evidence of renal or ureteral stones. Scattered aortic and iliac artery vascular calcifications. Soft tissues are otherwise unremarkable. No significant bony abnormality. IMPRESSION: No acute findings. Electronically Signed   By: Lajean Manes M.D.   On: 11/14/2015 09:28   Ct Head Wo Contrast  Result Date: 11/13/2015 CLINICAL DATA:  Nausea, vomiting, and diarrhea for 2 weeks. Near syncope. EXAM: CT HEAD WITHOUT CONTRAST TECHNIQUE: Contiguous axial images were obtained from the base of the skull through the vertex without intravenous contrast. COMPARISON:  None. FINDINGS: Brain: No subdural, epidural, or subarachnoid hemorrhage. Ventricles and sulci are normal. Cerebellum, brainstem, and basal cisterns are normal. No acute cortical ischemia or infarct. Vascular: Calcified atherosclerosis is seen in the intracranial portions of the carotid arteries. Skull: Normal. Negative for fracture or focal lesion. Sinuses/Orbits: No acute finding. Other: No other abnormalities. IMPRESSION: No acute intracranial abnormalities. Electronically Signed   By: Dorise Bullion III M.D   On: 11/13/2015 11:56   Dg Chest Port 1 View  Result Date: 11/01/2015 CLINICAL DATA:  Status post aortic valve replacement EXAM: PORTABLE CHEST 1 VIEW COMPARISON:  10/31/2015 FINDINGS: Cardiac shadow is stable. Postoperative changes are again seen. Pericardial drain and mediastinal drain are seen. Left thoracostomy catheter has been removed in the  interval. No pneumothorax is noted. Bibasilar atelectasis is seen. No bony abnormality is noted. IMPRESSION: No pneumothorax following chest tube removal. Electronically Signed   By: Inez Catalina M.D.   On: 11/01/2015 07:50   Dg Chest Port 1 View  Result Date: 10/31/2015 CLINICAL DATA:  Hx CABG and AVR on 9/15 EXAM: PORTABLE CHEST 1 VIEW COMPARISON:  10/30/2015 FINDINGS: Stable cardiac silhouette. Retraction Swan-Ganz catheter. LEFT chest tubes remain. LEFT basilar atelectasis and small effusion. RIGHT lung clear. IMPRESSION: 1. No significant change. 2. LEFT basilar atelectasis and effusion. Electronically Signed   By: Suzy Bouchard M.D.   On: 10/31/2015 07:33   Dg Chest Port 1 View  Result Date: 10/30/2015 CLINICAL DATA:  Status post aortic valve replacement and CABG surgery. EXAM: PORTABLE CHEST 1 VIEW COMPARISON:  10/29/2015 FINDINGS: Since the prior study, the endotracheal tube and the nasal/ orogastric tube have been removed. Right internal jugular Swan-Ganz catheter, left chest tubes and the mediastinal tube are stable. Cardiac silhouette is normal in size.  No mediastinal  widening. Lung base opacity is stable consistent with atelectasis. No pulmonary edema. No pneumothorax. IMPRESSION: 1. No acute findings or evidence of an operative complication. 2. Stable lung base opacity consistent with atelectasis. 3. Remaining support apparatus is well positioned. Electronically Signed   By: Lajean Manes M.D.   On: 10/30/2015 09:04   Dg Chest Port 1 View  Result Date: 10/29/2015 CLINICAL DATA:  Post CABG. EXAM: PORTABLE CHEST 1 VIEW COMPARISON:  10/28/2015. FINDINGS: Cardiomegaly. Status post CABG. Status post AVR. Mediastinal widening status post aortic root replacement. Swan-Ganz catheter tip RIGHT ventricle, and should be advanced. Orogastric tube in the stomach. LEFT chest tube good position. Endotracheal tube 7 cm above carina. Mediastinal tube good position. No pneumothorax. Mild vascular  congestion. IMPRESSION: Swan-Ganz catheter should be advanced into the RIGHT or LEFT pulmonary artery as it is too proximal. Postsurgical changes without other adverse features. These results will be called to the ordering clinician or representative by the Radiologist Assistant, and communication documented in the PACS or zVision Dashboard. Electronically Signed   By: Staci Righter M.D.   On: 10/29/2015 17:15   US Thoracentesis Asp Pleural Space W/img Guide  Result Date: 11/05/2015 INDICATION: Reason CABG. Right-sided pleural effusion. Request therapeutic thoracentesis. EXAM: ULTRASOUND GUIDED RIGHT THORACENTESIS MEDICATIONS: None. COMPLICATIONS: None immediate. PROCEDURE: An ultrasound guided thoracentesis was thoroughly discussed with the patient and questions answered. The benefits, risks, alternatives and complications were also discussed. The patient understands and wishes to proceed with the procedure. Written consent was obtained. Ultrasound was performed to localize and mark an adequate pocket of fluid in the right chest. The area was then prepped and draped in the normal sterile fashion. 1% Lidocaine was used for local anesthesia. Under ultrasound guidance a Safe-T-Centesis catheter was introduced. Thoracentesis was performed. The catheter was removed and a dressing applied. FINDINGS: A total of approximately 700 mL of bloody pleural fluid was removed. IMPRESSION: Successful ultrasound guided right thoracentesis yielding 700 mL of pleural fluid. Read by: Ascencion Dike PA-C Electronically Signed   By: Corrie Mckusick D.O.   On: 11/05/2015 15:13    ASSESSMENT AND PLAN  1. Orthostasis/autonomic dysfunction - avoid diuretics and after load reduction, and increase salt intake and increase physical activity. Continue midodrine.  2. S/p AVR/aortic root/CABG - he appears stable. 3. PAF - he is currently maintaining NSR on amiodarone.   Carleene Overlie Taylor,M.D.  11/20/2015 10:36 AMPatient ID: [name],  male   DOB: [birth year], 70 y.o.   MRN: [id number]

## 2015-11-20 NOTE — Progress Notes (Signed)
Possible discharge today if cards agrees.

## 2015-11-20 NOTE — Care Management Note (Addendum)
Case Management Note  Patient Details  Name: [name] MRN: [id number] Date of Birth: [birth year]  Subjective/Objective:                  Chief Complaint: Dizziness and weakness in setting of nausea vomiting and diarrhea  Action/Plan: Cm spoke to patient about discharge planning. He plans to be discharged home with spouse today and will resume HHPT with [facility] per his request. 3N1 to be delivered to the bedside by [facility]. Cm called [facility] for [facility] services and resaumption accepted and faxed orders to [facility] [phone] and confirmation received. No further CM needs communicated at this time. Patient's spouse to be at home to provide needed support.   Expected Discharge Date:  11/20/15           Expected Discharge Plan:  [place]  In-House Referral:     Discharge planning Services  CM Consult  Post Acute Care Choice:  Home Health, Resumption of Svcs/PTA Provider Choice offered to:  Patient, Spouse  DME Arranged:  3-N-1 DME Agency:  [place]:  PT [place]:  [place]  Status of Service:  Completed, signed off  If discussed at [place] of Stay Meetings, dates discussed:    Additional Comments:  Guido Sander, RN 11/20/2015, 12:29 PM

## 2015-11-20 NOTE — Discharge Summary (Signed)
Physician Discharge Summary  [name] [id number] DOB: Jul 09, 1945  PCP: Drema Pry, DO  Admit date: 11/13/2015 Discharge date: 11/20/2015  Admitted From: Home Disposition:  Home  Recommendations for Outpatient Follow-up:  1. Dr. Drema Pry, PCP In 4 days with repeat labs (CBC & BMP). 2. Dr. Grayland Jack, Cardiology in 1 week. 3. Dr. [name], TCTS on 12/09/2015 at 10:30 AM  Home Health: Resume home health physical therapy Equipment/Devices: None    Discharge Condition: Improved and stable  CODE STATUS: Full  Diet recommendation: Heart healthy diet.  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)   Atrial fibrillation (post op after CABG Sept 2017)   Nausea vomiting and diarrhea   Hematuria, gross   Dehydration with hyponatremia   Elevated troponin   Near syncope   Orthostatic hypotension   Difficulty in walking, not elsewhere classified   Fungus present in urine   Acute cystitis with hematuria   Brief/Interim Summary: 70 year old male with PMH of aortic stenosis, CAD, type II DM, essential hypertension, hyperlipidemia, recently discharged from the hospital on 11/08/15 status post 4 vessel CABG and AVR ([employer] pericardial tissue valve) for management of CAD and aortic stenosis. Postoperative course was complicated by PAF which was treated with amiodarone and Xarelto. He was now readmitted with 2-3 days history of nausea, nonbloody emesis, diarrhea, orthostatic hypotension, hematuria and near syncope.   Assessment & Plan:   Near syncope, likely related to hypotension/orthostatic hypotension - Due to several days' history of nausea, vomiting and diarrhea + medications & ? Related to recent surgery - Patient was  adequately hydrated but continued to be orthostatic. After discussing with cardiology and thoracic surgery, midodrine was initiated and titrated up to 10 mg 3 times daily. ARB, diuretics and beta blockers have been held. Thigh high TED hose was placed. - With these measures, orthostatics may be slightly better but symptomatically patient is doing much better and is able to ambulate only with minimal dizziness or lightheadedness at times. No sensation of feeling like he cannot pass out. - He has been repeatedly counseled regarding measures to minimize her symptoms from orthostatic hypotension: Gradual transition from supine to sitting to standing and ambulating, adequate hydration, increasing salt intake, elevating legs up when he is sitting, where TED hose when ambulating. He verbalizes understanding. - Patient has been instructed that he should not drive for at least 6 months. He says these instructions are in place from recent cardiac surgery.  Nausea, vomiting and diarrhea - Unclear etiology.? Acute viral GE. GI pathogen panel PCR and C. difficile testing negative. Empirically started IV metronidazole was discontinued on 10/4. KUB without obstruction. - Resolved.   Klebsiella pneumoniae UTI/hematuria - Patient had terminal dysuria and hematuria on admission. Patient transitioned from IV ceftriaxone to oral Keflex. Completed total 7 days treatment. - Patient complains of "tea-colored" urine and urine microscopy still had hematuria.  - Repeat urine microscopy 10/4 shows yeast-infectious disease input appreciated and no need to treat. - As per patient's report, gross hematuria has resolved. Recommend outpatient follow-up with repeat urine microscopy and if he has persistent microscopic hematuria, we will need outpatient evaluation and maybe urology consultation.   Dehydration with hyponatremia -Resolved.   Uncontrolled DM 2 - Hemoglobin A1c recently: 11.2. Metformin held due to  current acute  illness. Placed on reduced dose Lantus and SSIIn the hospital. -Reasonable inpatient control. - Resume prior home medications at discharge.  Essential hypertension - Controlled. Orthostatic changes. Started midodrine 10/5 and titrated up to 10 MG 3 times a day today. Change lower extremity TED hoses to thigh high.  Paroxysmal A. Fib - Paroxysmal seen during workout with cardiac rehabilitation. Cardiology follow-up appreciated. Continue amiodarone and Xarelto. - Patient having brief episodes of A. fib with RVR. As per cardiology follow-up, not adding beta blocker secondary to significant orthostatic hypotension. - This seems to have improved. As per discussion with Dr. Lovena Le, Cardiology, cleared for discharge home on current regimen.  Mildly elevated troponin - Likely postop/demand ischemia. No signs of acute coronary syndrome. Cardiology on board.  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  - LIMA to LAD, SVG to first and second OM, SVG to RCA. - Stable. As per discussion with Dr. Servando Snare, discontinued aspirin at discharge due to increased bleeding risk and cleared for discharge home and has appointment to follow up outpatient.    Consultants:   Cardiothoracic surgery  Cardiology  Procedures:   2-D echo 11/16/15: Study Conclusions  - Left ventricle: The cavity size was normal. Wall thickness was   increased in a pattern of moderate LVH. Systolic function was   normal. The estimated ejection fraction was in the range of 55%   to 60%. Wall motion was normal; there were no regional wall   motion abnormalities. Features are consistent with a pseudonormal   left ventricular filling pattern, with concomitant abnormal   relaxation and increased filling pressure (grade 2 diastolic   dysfunction). - Aortic valve: A bioprosthesis was present. Valve area (VTI): 1.25   cm^2.  Valve area (Vmax): 1.19 cm^2. Valve area (Vmean): 1.08   cm^2.  Impressions:  - Normal LV function   There is a mild gradient across the bioprosthetic AV.   This gradient is normal for this valve.  There was no pericardial effusion.   Discharge Instructions  Discharge Instructions    (HEART FAILURE PATIENTS) Call MD:  Anytime you have any of the following symptoms: 1) 3 pound weight gain in 24 hours or 5 pounds in 1 week 2) shortness of breath, with or without a dry hacking cough 3) swelling in the hands, feet or stomach 4) if you have to sleep on extra pillows at night in order to breathe.    Complete by:  As directed    Call MD for:    Complete by:  As directed    Passing out or feeling like passing out.   Call MD for:  difficulty breathing, headache or visual disturbances    Complete by:  As directed    Call MD for:  extreme fatigue    Complete by:  As directed    Call MD for:  persistant dizziness or light-headedness    Complete by:  As directed    Call MD for:  persistant nausea and vomiting    Complete by:  As directed    Call MD for:  redness, tenderness, or signs of infection (pain, swelling, redness, odor or green/yellow discharge around incision site)    Complete by:  As directed    Call MD for:  severe uncontrolled pain    Complete by:  As directed    Call MD for:  temperature >100.4    Complete by:  As directed    Diet - low sodium heart healthy    Complete by:  As directed    Diet Carb Modified    Complete by:  As directed    Driving Restrictions    Complete by:  As directed    Do not drive until cleared by your physicians during follow up in the office.   Increase activity slowly    Complete by:  As directed        Medication List    STOP taking these medications   aspirin 81 MG tablet   furosemide 40 MG tablet Commonly known as:  LASIX   losartan 25 MG tablet Commonly known as:  COZAAR   Metoprolol Tartrate 75 MG Tabs   potassium chloride SA  20 MEQ tablet Commonly known as:  K-DUR,KLOR-CON     TAKE these medications   amiodarone 200 MG tablet Commonly known as:  PACERONE Take 1 tablet (200 mg total) by mouth 2 (two) times daily.   atorvastatin 40 MG tablet Commonly known as:  LIPITOR Take 1 tablet (40 mg total) by mouth daily.   famotidine 10 MG chewable tablet Commonly known as:  PEPCID AC Chew 1 tablet (10 mg total) by mouth 2 (two) times daily as needed for heartburn.   ferrous gluconate 324 MG tablet Commonly known as:  FERGON Take 1 tablet (324 mg total) by mouth 2 (two) times daily with a meal.   folic acid 1 MG tablet Commonly known as:  FOLVITE Take 1 tablet (1 mg total) by mouth daily.   glucose blood test strip Use as instructed   Insulin Glargine 100 UNIT/ML Solostar Pen Commonly known as:  LANTUS SOLOSTAR Inject 25 Units into the skin daily at 10 pm.   insulin lispro 100 UNIT/ML KiwkPen Commonly known as:  HUMALOG KWIKPEN Use 5 to 10 units three times daily 10-15 minutes before meals What changed:  how much to take  how to take this  when to take this  additional instructions   metFORMIN 1000 MG tablet Commonly known as:  GLUCOPHAGE Take 1 tablet (1,000 mg total) by mouth 2 (two) times daily with a meal.   midodrine 10 MG tablet Commonly known as:  PROAMATINE Take 1 tablet (10 mg total) by mouth 3 (three) times daily with meals.   ondansetron 4 MG disintegrating tablet Commonly known as:  ZOFRAN ODT Take 1 tablet (4 mg total) by mouth every 8 (eight) hours as needed for nausea or vomiting.   oxyCODONE 5 MG immediate release tablet Commonly known as:  Oxy IR/ROXICODONE Take 1-2 tablets (5-10 mg total) by mouth every 6 (six) hours as needed for severe pain.   rivaroxaban 20 MG Tabs tablet Commonly known as:  XARELTO Take 1 tablet (20 mg total) by mouth daily with supper.      Follow-up Information    Drema Pry, DO. Schedule an appointment as soon as possible for a visit in 4  day(s).   Specialty:  Internal Medicine Why:  To be seen with repeat labs (CBC & BMP). Contact information: [place] [address] [phone]        [name], MD Follow up on 12/09/2015.   Specialty:  Cardiothoracic Surgery Why:  10:30 AM. Contact information: [address] [place] [address] [phone]        Mertie Moores, MD. Schedule an appointment as soon as possible for a visit in 1 week(s).   Specialty:  Cardiology Contact information: [address] [place] [address] [phone]          Allergies  Allergen Reactions  . No Known Allergies      Procedures/Studies: Dg Chest 1 View  Result Date: 11/05/2015 CLINICAL DATA:  Status post thoracentesis on the right EXAM: CHEST 1 VIEW COMPARISON:  November 04, 2015 FINDINGS: No pneumothorax evident. There has been interval resolution of pleural effusion on the right. A small left pleural effusion remains. There is bibasilar atelectatic change with patchy airspace consolidation in the left base. Heart is borderline enlarged with pulmonary vascularity within normal limits. Patient is status post coronary artery bypass grafting and aortic valve replacement. No bone lesions. No demonstrable adenopathy. There is an old healed fracture of the left clavicle. IMPRESSION: No pneumothorax following thoracentesis on the right. Interval resolution of right pleural effusion. Small persistent left pleural effusion. Persistent left lower lobe region consolidation. Stable cardiac silhouette. Electronically Signed   By: Lowella Grip III M.D.   On: 11/05/2015 15:23   Dg Chest 2 View  Result Date: 11/14/2015 CLINICAL DATA:  Medical hx: cancer, diabetes, hypertension. Patient is lightheaded with limited mobility. EXAM: CHEST  2 VIEW COMPARISON:  11/06/2015 FINDINGS: The cardiac silhouette is mildly enlarged. Changes from cardiac surgery and valve replacement are  stable. No mediastinal or hilar masses.  No convincing adenopathy. There is left greater than right lung base opacity mildly improved from the prior study. This may reflect pneumonia, atelectasis or a combination. Minimal left pleural effusion. No pneumothorax. Remainder of the lungs is clear. IMPRESSION: 1. Mild improvement in left greater than right lung base opacity. Decreased left pleural fluid. Residual lung base opacity is likely atelectasis. Pneumonia is possible. 2. No pulmonary edema.  No new abnormalities. Electronically Signed   By: Lajean Manes M.D.   On: 11/14/2015 09:27   Dg Chest 2 View  Result Date: 11/06/2015 CLINICAL DATA:  Pt states he had 2 valves replacements and an aortic aneurysm repair last week. Pt has no other symptoms or complaints and no other heart problems. Pt has diabetes. Pleural effusion, right EXAM: CHEST  2 VIEW COMPARISON:  11/05/2015 FINDINGS: Changes from cardiac surgery and valve replacement are stable. Cardiac silhouette is normal in size. No mediastinal or hilar masses. There is left greater than right lung base opacity. On the left, this is likely due to a combination of a small pleural effusion with atelectasis. The right lung base opacity is likely atelectasis. Is no overt pulmonary edema. No pneumothorax. IMPRESSION: 1. Bibasilar lung opacity is similar to the most recent prior exam. This is consistent with a combination a left pleural effusion and bilateral lung base atelectasis. 2. No overt pulmonary edema.  No pneumothorax. Electronically Signed   By: Lajean Manes M.D.   On: 11/06/2015 13:24   Dg Chest 2 View  Result Date: 11/04/2015 CLINICAL DATA:  Hypoxia, dizziness EXAM: CHEST  2 VIEW COMPARISON:  11/02/2015 FINDINGS: Status post median sternotomy and cardiac valve replacement. Cardiomediastinal silhouette is stable. Stable title again noted some right perihilar scarring. Bilateral small pleural effusion with bilateral basilar atelectasis or infiltrate. No  pulmonary edema. IMPRESSION: No pulmonary edema. Bilateral small pleural effusion with bilateral basilar atelectasis or infiltrate. Electronically Signed   By: Lahoma Crocker M.D.   On: 11/04/2015 12:51   Dg Chest 2 View  Result Date: 11/02/2015 CLINICAL DATA:  Cardiac surgery. EXAM: CHEST  2 VIEW COMPARISON:  Nineteen 2017. FINDINGS: Prior CABG. Cardiac valve replacement.  Stable cardiomegaly. Persistent bibasilar atelectasis and or infiltrate. Poor inspiration on lateral view. Bilateral pleural effusions cannot be excluded. IMPRESSION: 1. Prior CABG. Prior cardiac valve replacement. Stable cardiomegaly. No pulmonary venous congestion. 2. Stable bibasilar atelectasis and/or mild infiltrates. Poor inspiration on lateral view. Bilateral pleural effusions cannot be excluded. Electronically Signed   By: Marcello Moores  Register   On: 11/02/2015 07:36   Dg Chest 2 View  Result Date: 10/28/2015 CLINICAL DATA:  Preop chest exam for CABG. No current chest complaints. History of recent cardiac catheterization. EXAM: CHEST  2 VIEW COMPARISON:  04/03/2014 FINDINGS: The heart size and mediastinal contours are within normal limits. No acute pulmonary infiltrate, consolidation, or effusion. Cardiomediastinal silhouette stable. Small faint nodular opacities are seen in the left upper lobe and could relate to pulmonary vessels on end. No pneumothorax. Atherosclerotic calcifications in the aorta. Probable old left mid clavicular fracture. IMPRESSION: 1. No radiographic evidence for acute cardiopulmonary abnormality 2. Faint nodular opacities in the left upper lobe, possible pulmonary vessels on end versus small nodules. Suggest radiographic follow-up. Electronically Signed   By: Donavan Foil M.D.   On: 10/28/2015 12:35   Dg Abd 1 View  Result Date: 11/14/2015 CLINICAL DATA:  Medical hx: cancer, diabetes, hypertension. Patient is lightheaded with limited mobility. EXAM: ABDOMEN - 1 VIEW COMPARISON:  None. FINDINGS: Normal bowel gas  pattern. No evidence of renal or ureteral stones. Scattered aortic and iliac artery vascular calcifications. Soft tissues are otherwise unremarkable. No significant bony abnormality. IMPRESSION: No acute findings. Electronically Signed   By: Lajean Manes M.D.   On: 11/14/2015 09:28   Ct Head Wo Contrast  Result Date: 11/13/2015 CLINICAL DATA:  Nausea, vomiting, and diarrhea for 2 weeks. Near syncope. EXAM: CT HEAD WITHOUT CONTRAST TECHNIQUE: Contiguous axial images were obtained from the base of the skull through the vertex without intravenous contrast. COMPARISON:  None. FINDINGS: Brain: No subdural, epidural, or subarachnoid hemorrhage. Ventricles and sulci are normal. Cerebellum, brainstem, and basal cisterns are normal. No acute cortical ischemia or infarct. Vascular: Calcified atherosclerosis is seen in the intracranial portions of the carotid arteries. Skull: Normal. Negative for fracture or focal lesion. Sinuses/Orbits: No acute finding. Other: No other abnormalities. IMPRESSION: No acute intracranial abnormalities. Electronically Signed   By: Dorise Bullion III M.D   On: 11/13/2015 11:56   Dg Chest Port 1 View  Result Date: 11/01/2015 CLINICAL DATA:  Status post aortic valve replacement EXAM: PORTABLE CHEST 1 VIEW COMPARISON:  10/31/2015 FINDINGS: Cardiac shadow is stable. Postoperative changes are again seen. Pericardial drain and mediastinal drain are seen. Left thoracostomy catheter has been removed in the interval. No pneumothorax is noted. Bibasilar atelectasis is seen. No bony abnormality is noted. IMPRESSION: No pneumothorax following chest tube removal. Electronically Signed   By: Inez Catalina M.D.   On: 11/01/2015 07:50   Dg Chest Port 1 View  Result Date: 10/31/2015 CLINICAL DATA:  Hx CABG and AVR on 9/15 EXAM: PORTABLE CHEST 1 VIEW COMPARISON:  10/30/2015 FINDINGS: Stable cardiac silhouette. Retraction Swan-Ganz catheter. LEFT chest tubes remain. LEFT basilar atelectasis and small  effusion. RIGHT lung clear. IMPRESSION: 1. No significant change. 2. LEFT basilar atelectasis and effusion. Electronically Signed   By: Suzy Bouchard M.D.   On: 10/31/2015 07:33   Dg Chest Port 1 View  Result Date: 10/30/2015 CLINICAL DATA:  Status post aortic valve replacement and CABG surgery. EXAM: PORTABLE CHEST 1 VIEW COMPARISON:  10/29/2015 FINDINGS: Since the prior study, the endotracheal tube and the nasal/ orogastric  tube have been removed. Right internal jugular Swan-Ganz catheter, left chest tubes and the mediastinal tube are stable. Cardiac silhouette is normal in size.  No mediastinal widening. Lung base opacity is stable consistent with atelectasis. No pulmonary edema. No pneumothorax. IMPRESSION: 1. No acute findings or evidence of an operative complication. 2. Stable lung base opacity consistent with atelectasis. 3. Remaining support apparatus is well positioned. Electronically Signed   By: Lajean Manes M.D.   On: 10/30/2015 09:04   Dg Chest Port 1 View  Result Date: 10/29/2015 CLINICAL DATA:  Post CABG. EXAM: PORTABLE CHEST 1 VIEW COMPARISON:  10/28/2015. FINDINGS: Cardiomegaly. Status post CABG. Status post AVR. Mediastinal widening status post aortic root replacement. Swan-Ganz catheter tip RIGHT ventricle, and should be advanced. Orogastric tube in the stomach. LEFT chest tube good position. Endotracheal tube 7 cm above carina. Mediastinal tube good position. No pneumothorax. Mild vascular congestion. IMPRESSION: Swan-Ganz catheter should be advanced into the RIGHT or LEFT pulmonary artery as it is too proximal. Postsurgical changes without other adverse features. These results will be called to the ordering clinician or representative by the Radiologist Assistant, and communication documented in the PACS or zVision Dashboard. Electronically Signed   By: Staci Righter M.D.   On: 10/29/2015 17:15   US Thoracentesis Asp Pleural Space W/img Guide  Result Date: 11/05/2015 INDICATION:  Reason CABG. Right-sided pleural effusion. Request therapeutic thoracentesis. EXAM: ULTRASOUND GUIDED RIGHT THORACENTESIS MEDICATIONS: None. COMPLICATIONS: None immediate. PROCEDURE: An ultrasound guided thoracentesis was thoroughly discussed with the patient and questions answered. The benefits, risks, alternatives and complications were also discussed. The patient understands and wishes to proceed with the procedure. Written consent was obtained. Ultrasound was performed to localize and mark an adequate pocket of fluid in the right chest. The area was then prepped and draped in the normal sterile fashion. 1% Lidocaine was used for local anesthesia. Under ultrasound guidance a Safe-T-Centesis catheter was introduced. Thoracentesis was performed. The catheter was removed and a dressing applied. FINDINGS: A total of approximately 700 mL of bloody pleural fluid was removed. IMPRESSION: Successful ultrasound guided right thoracentesis yielding 700 mL of pleural fluid. Read by: Ascencion Dike PA-C Electronically Signed   By: Corrie Mckusick D.O.   On: 11/05/2015 15:13      Subjective: States that he is doing well. No further tea colored or blood in urine and states that urine color appears normal. Ambulating with only minimal dizziness at times and does not feel like he is going to pass out. No other complaints reported.   Discharge Exam:  Vitals:   11/19/15 1354 11/19/15 1722 11/19/15 2000 11/20/15 0505  BP: 108/65 136/73 (!) 116/57 121/60  Pulse: 80 (!) 120 76 76  Resp: 18  18 18   Temp: 98.5 F (36.9 C)  99.2 F (37.3 C) 98.6 F (37 C)  TempSrc: Oral  Oral Oral  SpO2: 97% 96% 98% 95%  Weight:    81.5 kg (179 lb 11.2 oz)  Height:        General exam: Pleasant middle-aged male sitting comfortably in chair.  Respiratory system: Clear to auscultation. Respiratory effort normal. Healed midline sternotomy scar.  Cardiovascular system: S1 & S2 heard, RRR. No JVD, murmurs, rubs, gallops or clicks. No  pedal edema. telemetry: Sinus rhythm.  Gastrointestinal system: Abdomen is nondistended, soft and nontender. No organomegaly or masses felt. Normal bowel sounds heard. Central nervous system: Alert and oriented. No focal neurological deficits. Extremities: Symmetric 5 x 5 power. Bilateral LE TED hoses. Skin: No  rashes, lesions or ulcers Psychiatry: Judgement and insight appear normal. Mood & affect appropriate.    The results of significant diagnostics from this hospitalization (including imaging, microbiology, ancillary and laboratory) are listed below for reference.     Microbiology: Recent Results (from the past 240 hour(s))  Urine culture     Status: Abnormal   Collection Time: 11/13/15  2:17 PM  Result Value Ref Range Status   Specimen Description URINE, RANDOM  Final   Special Requests NONE  Final   Culture >=100,000 COLONIES/mL KLEBSIELLA PNEUMONIAE (A)  Final   Report Status 11/15/2015 FINAL  Final   Organism ID, Bacteria KLEBSIELLA PNEUMONIAE (A)  Final      Susceptibility   Klebsiella pneumoniae - MIC*    AMPICILLIN >=32 RESISTANT Resistant     CEFAZOLIN <=4 SENSITIVE Sensitive     CEFTRIAXONE <=1 SENSITIVE Sensitive     CIPROFLOXACIN <=0.25 SENSITIVE Sensitive     GENTAMICIN <=1 SENSITIVE Sensitive     IMIPENEM <=0.25 SENSITIVE Sensitive     NITROFURANTOIN 32 SENSITIVE Sensitive     TRIMETH/SULFA <=20 SENSITIVE Sensitive     AMPICILLIN/SULBACTAM 4 SENSITIVE Sensitive     PIP/TAZO <=4 SENSITIVE Sensitive     Extended ESBL NEGATIVE Sensitive     * >=100,000 COLONIES/mL KLEBSIELLA PNEUMONIAE  C difficile quick scan w PCR reflex     Status: None   Collection Time: 11/13/15  2:25 PM  Result Value Ref Range Status   C Diff antigen NEGATIVE NEGATIVE Final   C Diff toxin NEGATIVE NEGATIVE Final   C Diff interpretation No C. difficile detected.  Final  Gastrointestinal Panel by PCR , Stool     Status: None   Collection Time: 11/13/15  2:25 PM  Result Value Ref Range  Status   Campylobacter species NOT DETECTED NOT DETECTED Final   Plesimonas shigelloides NOT DETECTED NOT DETECTED Final   Salmonella species NOT DETECTED NOT DETECTED Final   Yersinia enterocolitica NOT DETECTED NOT DETECTED Final   Vibrio species NOT DETECTED NOT DETECTED Final   Vibrio cholerae NOT DETECTED NOT DETECTED Final   Enteroaggregative E coli (EAEC) NOT DETECTED NOT DETECTED Final   Enteropathogenic E coli (EPEC) NOT DETECTED NOT DETECTED Final   Enterotoxigenic E coli (ETEC) NOT DETECTED NOT DETECTED Final   Shiga like toxin producing E coli (STEC) NOT DETECTED NOT DETECTED Final   Shigella/Enteroinvasive E coli (EIEC) NOT DETECTED NOT DETECTED Final   Cryptosporidium NOT DETECTED NOT DETECTED Final   Cyclospora cayetanensis NOT DETECTED NOT DETECTED Final   Entamoeba histolytica NOT DETECTED NOT DETECTED Final   Giardia lamblia NOT DETECTED NOT DETECTED Final   Adenovirus F40/41 NOT DETECTED NOT DETECTED Final   Astrovirus NOT DETECTED NOT DETECTED Final   Norovirus GI/GII NOT DETECTED NOT DETECTED Final   Rotavirus A NOT DETECTED NOT DETECTED Final   Sapovirus (I, II, IV, and V) NOT DETECTED NOT DETECTED Final  Culture, blood (Routine X 2) w Reflex to ID Panel     Status: None   Collection Time: 11/13/15  4:35 PM  Result Value Ref Range Status   Specimen Description BLOOD BLOOD RIGHT HAND  Final   Special Requests IN PEDIATRIC BOTTLE 2CC  Final   Culture NO GROWTH 5 DAYS  Final   Report Status 11/18/2015 FINAL  Final  Culture, blood (Routine X 2) w Reflex to ID Panel     Status: None   Collection Time: 11/13/15  4:49 PM  Result Value Ref Range Status  Specimen Description BLOOD RIGHT ANTECUBITAL  Final   Special Requests IN PEDIATRIC BOTTLE 2CC  Final   Culture NO GROWTH 5 DAYS  Final   Report Status 11/18/2015 FINAL  Final     Labs: BNP (last 3 results) No results for input(s): BNP in the last 8760 hours. Basic Metabolic Panel:  Recent Labs Lab  11/14/15 0917 11/15/15 0525 11/16/15 0149 11/17/15 0223  NA 137 137 134* 136  K 3.9 4.3 4.0 4.3  CL 103 105 99* 103  CO2 25 25 27 28   GLUCOSE 138* 122* 119* 135*  BUN 8 <5* <5* 6  CREATININE 0.92 0.78 0.77 0.86  CALCIUM 8.5* 8.7* 8.7* 8.8*   Liver Function Tests:  Recent Labs Lab 11/14/15 0917 11/17/15 0223  AST 50* 80*  ALT 43 51  ALKPHOS 85 91  BILITOT 1.1 0.4  PROT 6.7 6.4*  ALBUMIN 2.7* 2.5*   No results for input(s): LIPASE, AMYLASE in the last 168 hours. No results for input(s): AMMONIA in the last 168 hours. CBC:  Recent Labs Lab 11/14/15 0917 11/14/15 1906 11/15/15 0525 11/16/15 0149 11/17/15 0223  WBC 9.4 7.3 7.7 9.0 8.6  NEUTROABS  --  4.8  --   --   --   HGB 9.5* 9.5* 9.6* 9.8* 10.0*  HCT 30.0* 29.9* 30.7* 30.8* 31.4*  MCV 91.2 90.3 89.8 89.3 89.2  PLT 620* 521* 581* 581* 556*   Cardiac Enzymes:  Recent Labs Lab 11/13/15 1633 11/13/15 2352 11/14/15 0917  TROPONINI 0.41* 0.10* 0.12*   BNP: Invalid input(s): POCBNP CBG:  Recent Labs Lab 11/19/15 1116 11/19/15 1634 11/19/15 2100 11/20/15 0622 11/20/15 1135  GLUCAP 244* 218* 125* 153* 183*   Urinalysis    Component Value Date/Time   COLORURINE YELLOW 11/17/2015 1310   APPEARANCEUR CLEAR 11/17/2015 1310   LABSPEC 1.018 11/17/2015 1310   PHURINE 6.0 11/17/2015 1310   GLUCOSEU NEGATIVE 11/17/2015 1310   GLUCOSEU NEGATIVE 04/06/2010 1554   HGBUR LARGE (A) 11/17/2015 1310   BILIRUBINUR NEGATIVE 11/17/2015 1310   BILIRUBINUR Negative 04/03/2014 1642   KETONESUR 15 (A) 11/17/2015 1310   PROTEINUR 30 (A) 11/17/2015 1310   UROBILINOGEN 1.0 04/03/2014 1642   UROBILINOGEN 1.0 04/03/2014 0058   NITRITE POSITIVE (A) 11/17/2015 1310   LEUKOCYTESUR SMALL (A) 11/17/2015 1310      Time coordinating discharge: Over 30 minutes  SIGNED:  Vernell Leep, MD, FACP, FHM. [facility] Pager [phone]  If 7PM-7AM, please contact night-coverage [email or web address] Password  [email or web address] 11/20/2015, 12:14 PM

## 2015-11-20 NOTE — Progress Notes (Signed)
Patient ID: [name], male   DOB: [birth year], 70 y.o.   MRN: [id number]      [address]       [place] [address]             [phone]                     LOS: 6 days   Subjective: Feels much better,   Objective: Vital signs in last 24 hours: Patient Vitals for the past 24 hrs:  BP Temp Temp src Pulse Resp SpO2 Weight  11/20/15 0505 121/60 98.6 F (37 C) Oral 76 18 95 % 179 lb 11.2 oz (81.5 kg)  11/19/15 2000 (!) 116/57 99.2 F (37.3 C) Oral 76 18 98 % -  11/19/15 1722 136/73 - - (!) 120 - 96 % -  11/19/15 1354 108/65 98.5 F (36.9 C) Oral 80 18 97 % -    Filed Weights   11/18/15 0507 11/19/15 0501 11/20/15 0505  Weight: 184 lb 3.2 oz (83.6 kg) 184 lb 12.8 oz (83.8 kg) 179 lb 11.2 oz (81.5 kg)    Hemodynamic parameters for last 24 hours:    Intake/Output from previous day: 10/06 0701 - 10/07 0700 In: 816 [P.O.:816] Out: 1525 [Urine:1525] Intake/Output this shift: Total I/O In: 240 [P.O.:240] Out: -   Scheduled Meds: . amiodarone  200 mg Oral BID  . atorvastatin  40 mg Oral q1800  . insulin aspart  0-9 Units Subcutaneous TID WC  . insulin aspart  4 Units Subcutaneous TID WC  . insulin glargine  20 Units Subcutaneous Q2200  . midodrine  10 mg Oral TID WC  . rivaroxaban  20 mg Oral Q supper  . saccharomyces boulardii  250 mg Oral BID   Continuous Infusions:  PRN Meds:.acetaminophen **OR** [DISCONTINUED] acetaminophen  General appearance: alert and cooperative Neurologic: intact Heart: regular rate and rhythm, S1, S2 normal, no murmur, click, rub or gallop Lungs: clear to auscultation bilaterally Abdomen: soft, non-tender; bowel sounds normal; no masses,  no organomegaly Extremities: extremities normal, atraumatic, no cyanosis or edema and Homans sign is negative, no sign of DVT Wound: sternum stable   Lab Results:   Assessment/Plan: Going home today , plan to see back in office oct 24 Since on xarelto and with risk of  bleeding ASA to be held until off xarelto    [name] MD 11/20/2015 12:10 PM

## 2015-11-20 NOTE — Progress Notes (Signed)
CARDIAC REHAB PHASE I   PRE:  Rate/Rhythm: 95  BP:   Sitting: 121/69     SaO2: 98% RA  MODE:  Ambulation: 550 ft   POST:  Rate/Rhythm: 115  BP:   Sitting: 129/66     SaO2: 97% RA 1050-1118  Pt ambulated 556ft with walker, one person A and gait belt. Pt maintained steady gait. Pt returned to recliner with LE elevated and VSS. Reinforced HH diet, exercise guidelines, sternal precautions, lifting restrictions and wound care. Pt was previously educated and did teach back for reinforcement. Very pleasant man! Pt is eager to get started with cardiac rehab at [facility].   Evea Sheek D Efrain Clauson,MS,ACSM-RCEP 11/20/2015 11:12 AM

## 2015-11-23 ENCOUNTER — Telehealth: Payer: Self-pay | Admitting: Cardiovascular Disease

## 2015-11-23 ENCOUNTER — Telehealth: Payer: Self-pay | Admitting: Internal Medicine

## 2015-11-23 ENCOUNTER — Encounter: Payer: Self-pay | Admitting: Cardiovascular Disease

## 2015-11-23 MED ORDER — AMIODARONE HCL 200 MG PO TABS: 200.0000 mg | ORAL_TABLET | Freq: Every day | ORAL | 1 refills | Status: DC

## 2015-11-23 NOTE — Telephone Encounter (Signed)
PT HAS BEEN SCH

## 2015-11-23 NOTE — Telephone Encounter (Signed)
This recurrent nausea and vomitting is not likely something that I can help him with. Lets decrease the amiodarone to 200 a day 9 from 200 BID ) which may help   He needs to see his medical doctor or go back to the ER. ? Gall bladder issues , ? SBO,   His orthostastatic hypotension is due to his nausea and vomitting  He is not on any meds that would lower his BP

## 2015-11-23 NOTE — Telephone Encounter (Signed)
Also see telephone note from PCP dated today I called and spoke with patient and advised per Dr. Acie Fredrickson, to reduce amiodarone to 200 mg daily.  I advised that he should receive a call from [facility] for an appointment to evaluate cause of vomiting.  Patient verbalized understanding and agreement and thanked me for the call.

## 2015-11-23 NOTE — Telephone Encounter (Signed)
[name], please call Sharyn Lull at Cardiology [phone] to get pt scheduled. Tommi Rumps said he will see patient. Thanks

## 2015-11-23 NOTE — Telephone Encounter (Signed)
Sharyn Lull please return michelle call concerning this patient

## 2015-11-23 NOTE — Telephone Encounter (Signed)
New message   Denyse Amass from [facility] Call requesting to speak with RN. Denyse Amass states pts vomiting and nausea has returned and he wanted to speak with RN about pt. Please call back to discuss

## 2015-11-23 NOTE — Telephone Encounter (Signed)
Spoke with [place] manager at [employer]  She is in agreement that patient needs appointment for evaluation of n/v.  Dr. Shawna Orleans is no longer in practice so patient will need to see a different provider.  She is going to check with [name], NP who saw the patient in November and with Dr. Martinique to see who has soonest appointment and will call me back.  Dr. Martinique at [facility] Thursday 10/12 at 10:00

## 2015-11-23 NOTE — Telephone Encounter (Signed)
Spoke with Evelena Peat, nurse from [facility] who states he went to visit patient today.  Patient was discharged Saturday following admission for complications from CABG.  Patient had vomiting that resulted in orthostatic hypotension.  He was doing well until last night when he started vomiting again.  Evelena Peat reports that on Sunday his BP was 130 mmHg sitting and went to 110 mmHg standing.  He reports BP today is 130 mmHg sitting and 80/60 mmHg standing.  He drew lab work of CBC and bmet today and is transporting it to lab now.  He reports patient is taking zofran without relief; blood sugar is  Normal and that patient reports urine is normal in color.  States patient c/o dizziness.  I advised that I will send to Dr. Acie Fredrickson for advice.  He requests a call back so that he can document the plan as well as a call to the patient. I thanked him for the call.

## 2015-11-24 ENCOUNTER — Encounter (HOSPITAL_COMMUNITY): Payer: Self-pay | Admitting: Cardiology

## 2015-11-24 ENCOUNTER — Emergency Department (HOSPITAL_COMMUNITY): Payer: Medicare Other

## 2015-11-24 ENCOUNTER — Encounter: Payer: Self-pay | Admitting: Physician Assistant

## 2015-11-24 ENCOUNTER — Telehealth: Payer: Self-pay | Admitting: Internal Medicine

## 2015-11-24 ENCOUNTER — Ambulatory Visit: Payer: Self-pay | Admitting: Adult Health

## 2015-11-24 ENCOUNTER — Other Ambulatory Visit: Payer: Self-pay

## 2015-11-24 ENCOUNTER — Ambulatory Visit: Payer: Self-pay | Admitting: Physician Assistant

## 2015-11-24 ENCOUNTER — Inpatient Hospital Stay (HOSPITAL_COMMUNITY)
Admission: EM | Admit: 2015-11-24 | Discharge: 2015-11-27 | DRG: 308 | Disposition: A | Discharge status: Home Health | Payer: Medicare Other | Attending: Internal Medicine | Admitting: Internal Medicine

## 2015-11-24 DIAGNOSIS — I10 Essential (primary) hypertension: Secondary | ICD-10-CM | POA: Diagnosis present

## 2015-11-24 DIAGNOSIS — IMO0002 Reserved for concepts with insufficient information to code with codable children: Secondary | ICD-10-CM

## 2015-11-24 DIAGNOSIS — E1143 Type 2 diabetes mellitus with diabetic autonomic (poly)neuropathy: Secondary | ICD-10-CM | POA: Diagnosis present

## 2015-11-24 DIAGNOSIS — E1165 Type 2 diabetes mellitus with hyperglycemia: Secondary | ICD-10-CM | POA: Diagnosis present

## 2015-11-24 DIAGNOSIS — Z794 Long term (current) use of insulin: Secondary | ICD-10-CM | POA: Diagnosis not present

## 2015-11-24 DIAGNOSIS — I2510 Atherosclerotic heart disease of native coronary artery without angina pectoris: Secondary | ICD-10-CM | POA: Diagnosis present

## 2015-11-24 DIAGNOSIS — K3184 Gastroparesis: Secondary | ICD-10-CM | POA: Diagnosis present

## 2015-11-24 DIAGNOSIS — E11319 Type 2 diabetes mellitus with unspecified diabetic retinopathy without macular edema: Secondary | ICD-10-CM | POA: Diagnosis present

## 2015-11-24 DIAGNOSIS — I951 Orthostatic hypotension: Secondary | ICD-10-CM | POA: Diagnosis present

## 2015-11-24 DIAGNOSIS — Z953 Presence of xenogenic heart valve: Secondary | ICD-10-CM | POA: Diagnosis not present

## 2015-11-24 DIAGNOSIS — I480 Paroxysmal atrial fibrillation: Principal | ICD-10-CM | POA: Diagnosis present

## 2015-11-24 DIAGNOSIS — R197 Diarrhea, unspecified: Secondary | ICD-10-CM | POA: Diagnosis present

## 2015-11-24 DIAGNOSIS — E785 Hyperlipidemia, unspecified: Secondary | ICD-10-CM | POA: Diagnosis present

## 2015-11-24 DIAGNOSIS — Z7901 Long term (current) use of anticoagulants: Secondary | ICD-10-CM | POA: Diagnosis not present

## 2015-11-24 DIAGNOSIS — N179 Acute kidney failure, unspecified: Secondary | ICD-10-CM

## 2015-11-24 DIAGNOSIS — Y95 Nosocomial condition: Secondary | ICD-10-CM | POA: Diagnosis not present

## 2015-11-24 DIAGNOSIS — E1149 Type 2 diabetes mellitus with other diabetic neurological complication: Secondary | ICD-10-CM | POA: Diagnosis present

## 2015-11-24 DIAGNOSIS — J690 Pneumonitis due to inhalation of food and vomit: Secondary | ICD-10-CM | POA: Diagnosis present

## 2015-11-24 DIAGNOSIS — Z79899 Other long term (current) drug therapy: Secondary | ICD-10-CM | POA: Diagnosis not present

## 2015-11-24 DIAGNOSIS — B961 Klebsiella pneumoniae [K. pneumoniae] as the cause of diseases classified elsewhere: Secondary | ICD-10-CM | POA: Diagnosis present

## 2015-11-24 DIAGNOSIS — Z808 Family history of malignant neoplasm of other organs or systems: Secondary | ICD-10-CM | POA: Diagnosis not present

## 2015-11-24 DIAGNOSIS — Z23 Encounter for immunization: Secondary | ICD-10-CM

## 2015-11-24 DIAGNOSIS — I4892 Unspecified atrial flutter: Secondary | ICD-10-CM | POA: Diagnosis present

## 2015-11-24 DIAGNOSIS — E1140 Type 2 diabetes mellitus with diabetic neuropathy, unspecified: Secondary | ICD-10-CM

## 2015-11-24 DIAGNOSIS — I4891 Unspecified atrial fibrillation: Secondary | ICD-10-CM

## 2015-11-24 DIAGNOSIS — E08311 Diabetes mellitus due to underlying condition with unspecified diabetic retinopathy with macular edema: Secondary | ICD-10-CM | POA: Diagnosis not present

## 2015-11-24 DIAGNOSIS — J189 Pneumonia, unspecified organism: Secondary | ICD-10-CM | POA: Diagnosis not present

## 2015-11-24 DIAGNOSIS — Z833 Family history of diabetes mellitus: Secondary | ICD-10-CM

## 2015-11-24 DIAGNOSIS — N390 Urinary tract infection, site not specified: Secondary | ICD-10-CM | POA: Diagnosis present

## 2015-11-24 DIAGNOSIS — E869 Volume depletion, unspecified: Secondary | ICD-10-CM | POA: Diagnosis present

## 2015-11-24 DIAGNOSIS — Z951 Presence of aortocoronary bypass graft: Secondary | ICD-10-CM | POA: Diagnosis not present

## 2015-11-24 LAB — COMPREHENSIVE METABOLIC PANEL
ALBUMIN: 3.2 g/dL — AB (ref 3.5–5.0)
ALT: 28 U/L (ref 17–63)
ANION GAP: 10 (ref 5–15)
AST: 22 U/L (ref 15–41)
Alkaline Phosphatase: 84 U/L (ref 38–126)
BUN: 11 mg/dL (ref 6–20)
CHLORIDE: 100 mmol/L — AB (ref 101–111)
CO2: 25 mmol/L (ref 22–32)
CREATININE: 1.55 mg/dL — AB (ref 0.61–1.24)
Calcium: 9.1 mg/dL (ref 8.9–10.3)
GFR calc Af Amer: 51 mL/min — ABNORMAL LOW (ref >60)
GFR calc non Af Amer: 44 mL/min — ABNORMAL LOW (ref >60)
Glucose, Bld: 144 mg/dL — ABNORMAL HIGH (ref 65–99)
POTASSIUM: 5.1 mmol/L (ref 3.5–5.1)
SODIUM: 135 mmol/L (ref 135–145)
Total Bilirubin: 1.1 mg/dL (ref 0.3–1.2)
Total Protein: 7.3 g/dL (ref 6.5–8.1)

## 2015-11-24 LAB — URINALYSIS, ROUTINE W REFLEX MICROSCOPIC
BILIRUBIN URINE: NEGATIVE
Glucose, UA: NEGATIVE mg/dL
Ketones, ur: 15 mg/dL — AB
Nitrite: NEGATIVE
PH: 5.5 (ref 5.0–8.0)
Protein, ur: 30 mg/dL — AB
SPECIFIC GRAVITY, URINE: 1.016 (ref 1.005–1.030)

## 2015-11-24 LAB — CBC
HCT: 33.8 % — ABNORMAL LOW (ref 39.0–52.0)
Hemoglobin: 11.1 g/dL — ABNORMAL LOW (ref 13.0–17.0)
MCH: 28.7 pg (ref 26.0–34.0)
MCHC: 32.8 g/dL (ref 30.0–36.0)
MCV: 87.3 fL (ref 78.0–100.0)
PLATELETS: 367 10*3/uL (ref 150–400)
RBC: 3.87 MIL/uL — AB (ref 4.22–5.81)
RDW: 14.9 % (ref 11.5–15.5)
WBC: 15.8 10*3/uL — AB (ref 4.0–10.5)

## 2015-11-24 LAB — GLUCOSE, CAPILLARY
GLUCOSE-CAPILLARY: 128 mg/dL — AB (ref 65–99)
Glucose-Capillary: 137 mg/dL — ABNORMAL HIGH (ref 65–99)

## 2015-11-24 LAB — URINE MICROSCOPIC-ADD ON

## 2015-11-24 LAB — LIPASE, BLOOD: Lipase: 29 U/L (ref 11–51)

## 2015-11-24 LAB — MAGNESIUM: MAGNESIUM: 1.8 mg/dL (ref 1.7–2.4)

## 2015-11-24 LAB — LACTIC ACID, PLASMA
LACTIC ACID, VENOUS: 1.1 mmol/L (ref 0.5–1.9)
LACTIC ACID, VENOUS: 0.9 mmol/L (ref 0.5–1.9)

## 2015-11-24 LAB — PROCALCITONIN: PROCALCITONIN: 0.1 ng/mL

## 2015-11-24 LAB — PROTIME-INR
INR: 2.52
PROTHROMBIN TIME: 27.7 s — AB (ref 11.4–15.2)

## 2015-11-24 LAB — I-STAT CG4 LACTIC ACID, ED
LACTIC ACID, VENOUS: 2.21 mmol/L — AB (ref 0.5–1.9)
Lactic Acid, Venous: 1.44 mmol/L (ref 0.5–1.9)

## 2015-11-24 MED ORDER — INSULIN GLARGINE 100 UNIT/ML ~~LOC~~ SOLN
10.0000 [IU] | Freq: Every day | SUBCUTANEOUS | Status: DC
  Administered 2015-11-24 – 2015-11-26 (×3): 10 [IU] via SUBCUTANEOUS
  Filled 2015-11-24 (×4): qty 0.1

## 2015-11-24 MED ORDER — PIPERACILLIN-TAZOBACTAM 3.375 G IVPB 30 MIN: 3.3750 g | Freq: Once | INTRAVENOUS | Status: DC

## 2015-11-24 MED ORDER — AMIODARONE HCL IN DEXTROSE 360-4.14 MG/200ML-% IV SOLN
30.0000 mg/h | INTRAVENOUS | Status: DC
  Administered 2015-11-25: 30 mg/h via INTRAVENOUS
  Filled 2015-11-24: qty 200

## 2015-11-24 MED ORDER — ACETAMINOPHEN 325 MG PO TABS: 650.0000 mg | ORAL_TABLET | Freq: Four times a day (QID) | ORAL | Status: DC | PRN

## 2015-11-24 MED ORDER — ACETAMINOPHEN 650 MG RE SUPP: 650.0000 mg | Freq: Four times a day (QID) | RECTAL | Status: DC | PRN

## 2015-11-24 MED ORDER — AMIODARONE LOAD VIA INFUSION
150.0000 mg | Freq: Once | INTRAVENOUS | Status: AC
  Administered 2015-11-24: 150 mg via INTRAVENOUS
  Filled 2015-11-24: qty 83.34

## 2015-11-24 MED ORDER — VANCOMYCIN HCL IN DEXTROSE 750-5 MG/150ML-% IV SOLN
750.0000 mg | Freq: Two times a day (BID) | INTRAVENOUS | Status: DC
  Filled 2015-11-24: qty 150

## 2015-11-24 MED ORDER — PANTOPRAZOLE SODIUM 40 MG PO TBEC
40.0000 mg | DELAYED_RELEASE_TABLET | Freq: Every day | ORAL | Status: DC
  Administered 2015-11-24 – 2015-11-27 (×4): 40 mg via ORAL
  Filled 2015-11-24 (×4): qty 1

## 2015-11-24 MED ORDER — SODIUM CHLORIDE 0.9 % IV BOLUS (SEPSIS)
500.0000 mL | Freq: Once | INTRAVENOUS | Status: AC
  Administered 2015-11-24: 500 mL via INTRAVENOUS

## 2015-11-24 MED ORDER — VANCOMYCIN HCL 10 G IV SOLR
1500.0000 mg | Freq: Once | INTRAVENOUS | Status: AC
  Administered 2015-11-24: 1500 mg via INTRAVENOUS
  Filled 2015-11-24: qty 1500

## 2015-11-24 MED ORDER — RIVAROXABAN 20 MG PO TABS
20.0000 mg | ORAL_TABLET | Freq: Every day | ORAL | Status: DC
  Administered 2015-11-24 – 2015-11-26 (×3): 20 mg via ORAL
  Filled 2015-11-24 (×3): qty 1

## 2015-11-24 MED ORDER — OXYCODONE HCL 5 MG PO TABS: 5.0000 mg | ORAL_TABLET | Freq: Four times a day (QID) | ORAL | Status: DC | PRN

## 2015-11-24 MED ORDER — DEXTROSE 5 % IV SOLN
1.0000 g | Freq: Three times a day (TID) | INTRAVENOUS | Status: DC
  Filled 2015-11-24: qty 1

## 2015-11-24 MED ORDER — DILTIAZEM HCL-DEXTROSE 100-5 MG/100ML-% IV SOLN (PREMIX): 5.0000 mg/h | INTRAVENOUS | Status: DC

## 2015-11-24 MED ORDER — AMIODARONE HCL IN DEXTROSE 360-4.14 MG/200ML-% IV SOLN
60.0000 mg/h | INTRAVENOUS | Status: DC
  Administered 2015-11-24 (×2): 60 mg/h via INTRAVENOUS
  Filled 2015-11-24: qty 200

## 2015-11-24 MED ORDER — SODIUM CHLORIDE 0.9 % IV SOLN
INTRAVENOUS | Status: DC
  Administered 2015-11-24 – 2015-11-27 (×5): via INTRAVENOUS

## 2015-11-24 MED ORDER — ONDANSETRON HCL 4 MG/2ML IJ SOLN: 4.0000 mg | Freq: Four times a day (QID) | INTRAMUSCULAR | Status: DC | PRN

## 2015-11-24 MED ORDER — INFLUENZA VAC SPLIT QUAD 0.5 ML IM SUSY
0.5000 mL | PREFILLED_SYRINGE | INTRAMUSCULAR | Status: AC
  Administered 2015-11-25: 0.5 mL via INTRAMUSCULAR
  Filled 2015-11-24: qty 0.5

## 2015-11-24 MED ORDER — MIDODRINE HCL 5 MG PO TABS
10.0000 mg | ORAL_TABLET | Freq: Three times a day (TID) | ORAL | Status: DC
  Administered 2015-11-25 – 2015-11-27 (×8): 10 mg via ORAL
  Filled 2015-11-24 (×8): qty 2

## 2015-11-24 MED ORDER — ONDANSETRON HCL 4 MG/2ML IJ SOLN
4.0000 mg | Freq: Once | INTRAMUSCULAR | Status: AC
  Administered 2015-11-24: 4 mg via INTRAVENOUS
  Filled 2015-11-24: qty 2

## 2015-11-24 MED ORDER — PNEUMOCOCCAL VAC POLYVALENT 25 MCG/0.5ML IJ INJ
0.5000 mL | INJECTION | INTRAMUSCULAR | Status: AC
  Administered 2015-11-25: 0.5 mL via INTRAMUSCULAR
  Filled 2015-11-24: qty 0.5

## 2015-11-24 MED ORDER — SODIUM CHLORIDE 0.9 % IV BOLUS (SEPSIS)
1000.0000 mL | Freq: Once | INTRAVENOUS | Status: AC
  Administered 2015-11-24: 1000 mL via INTRAVENOUS

## 2015-11-24 MED ORDER — INSULIN ASPART 100 UNIT/ML ~~LOC~~ SOLN: 0.0000 [IU] | Freq: Every day | SUBCUTANEOUS | Status: DC

## 2015-11-24 MED ORDER — SODIUM CHLORIDE 0.9% FLUSH
3.0000 mL | Freq: Two times a day (BID) | INTRAVENOUS | Status: DC
  Administered 2015-11-24 – 2015-11-26 (×5): 3 mL via INTRAVENOUS

## 2015-11-24 MED ORDER — INSULIN ASPART 100 UNIT/ML ~~LOC~~ SOLN
0.0000 [IU] | Freq: Three times a day (TID) | SUBCUTANEOUS | Status: DC
  Administered 2015-11-25 (×3): 1 [IU] via SUBCUTANEOUS
  Administered 2015-11-26: 3 [IU] via SUBCUTANEOUS
  Administered 2015-11-26: 1 [IU] via SUBCUTANEOUS
  Administered 2015-11-27: 2 [IU] via SUBCUTANEOUS
  Administered 2015-11-27: 1 [IU] via SUBCUTANEOUS

## 2015-11-24 MED ORDER — PIPERACILLIN-TAZOBACTAM 3.375 G IVPB
3.3750 g | Freq: Three times a day (TID) | INTRAVENOUS | Status: DC
  Administered 2015-11-25 – 2015-11-26 (×5): 3.375 g via INTRAVENOUS
  Filled 2015-11-24 (×8): qty 50

## 2015-11-24 MED ORDER — FOLIC ACID 1 MG PO TABS
1.0000 mg | ORAL_TABLET | Freq: Every day | ORAL | Status: DC
  Administered 2015-11-25 – 2015-11-27 (×3): 1 mg via ORAL
  Filled 2015-11-24 (×3): qty 1

## 2015-11-24 MED ORDER — CEFEPIME HCL 2 G IJ SOLR
2.0000 g | Freq: Once | INTRAMUSCULAR | Status: AC
  Administered 2015-11-24: 2 g via INTRAVENOUS
  Filled 2015-11-24: qty 2

## 2015-11-24 MED ORDER — DILTIAZEM LOAD VIA INFUSION: 20.0000 mg | Freq: Once | INTRAVENOUS | Status: DC

## 2015-11-24 MED ORDER — ONDANSETRON HCL 4 MG PO TABS: 4.0000 mg | ORAL_TABLET | Freq: Four times a day (QID) | ORAL | Status: DC | PRN

## 2015-11-24 NOTE — ED Notes (Signed)
Gave pt urinal and informed him that we needed a urine sample.

## 2015-11-24 NOTE — ED Provider Notes (Signed)
[facility] DEPT Provider Note   CSN: [id number] Arrival date & time: 11/24/15  1150     History   Chief Complaint Chief Complaint  Patient presents with  . Near Syncope  . Atrial Fibrillation    HPI [name] is a 70 y.o. male who  has a past medical history of Aortic stenosis; Carpal tunnel syndrome (09/24/2014); Coronary artery disease; Diabetes mellitus, type 2 ([place]); Erectile dysfunction; Essential hypertension; and Hyperlipidemia. [name] Kitchen He is status post aortic valve replacement and a quadruple bypass in August 2017. Ever since that time he has had significant problems with dizziness and presyncope along with orthostatic hypotension. The patient was admitted for new onset A. fib 2 weeks ago along with orthostatic syncope. He has been taking midodrine which has had minimal effect. The patient has had intermittent waves of nausea and vomiting. He had this twice yesterday. Today he vomited bilious vomitus and was feeling extremely weak. He called her cardiologist used told him to come to the emergency department. He denies fevers, chills, shortness of breath. He denies abdominal pain or urinary symptoms. Patient presents in A. fib with RVR today.   HPI  Past Medical History:  Diagnosis Date  . 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 Carpal tunnel syndrome 09/24/2014   Bilateral  . 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 Diabetes mellitus, type 2 ([place])   . Erectile dysfunction   . Essential hypertension   . Hyperlipidemia     Patient Active Problem List   Diagnosis Date Noted  . HCAP (healthcare-associated pneumonia) 11/24/2015  . AKI (acute kidney injury) ([place]) 11/24/2015  . Paroxysmal atrial fibrillation ([place]) 11/24/2015  . Orthostatic hypotension   . Difficulty in walking, not elsewhere classified   . Fungus present in urine   . Acute cystitis with hematuria   . Near syncope 11/14/2015  . Hx of CABG (Sept  2017) 11/13/2015  . Atrial fibrillation (post op after CABG Sept 2017) 11/13/2015  . Nausea vomiting and diarrhea 11/13/2015  . Orthostatic syncope 11/13/2015  . Hematuria, gross 11/13/2015  . Dehydration with hyponatremia 11/13/2015  . Elevated troponin 11/13/2015  . Coronary artery disease involving native coronary artery of native heart without angina pectoris   . Aortic stenosis s/p tissue valve (Sept 2017) 10/28/2015  . Atypical angina ([place]) 10/27/2015  . Abnormal nuclear stress test 10/27/2015  . Coronary artery disease involving native heart with angina pectoris ([place]) 10/27/2015  . Aortic aneurysm ([place]) 07/23/2015  . Carpal tunnel syndrome 09/24/2014  . Right hand pain 12/29/2013  . Left carotid bruit 05/07/2013  . Ganglion cyst 12/30/2012  . Diabetic retinopathy ([place]) 09/23/2012  . Diminished pulses in lower extremity 06/17/2012  . Chest pain 03/22/2011  . Paresthesia of foot 11/04/2010  . Aortic valve disease 04/06/2010  . HYPERTROPHY PROSTATE W/UR OBST & OTH LUTS 03/15/2009  . Hyperlipidemia 12/13/2007  . Type II diabetes mellitus with neurological manifestations, uncontrolled ([place]) 11/29/2007  . ERECTILE DYSFUNCTION 11/29/2007  . Essential hypertension 11/29/2007    Past Surgical History:  Procedure Laterality Date  . AORTIC VALVE REPLACEMENT N/A 10/29/2015   Procedure: AORTIC VALVE REPLACEMENT (AVR) WITH 23MM MAGNA EASE TISSUE VALVE.;  Surgeon: [name], MD;  Location: [facility];  Service: Open Heart Surgery;  Laterality: N/A;  . CARDIAC CATHETERIZATION N/A 10/27/2015   Procedure: Left Heart Cath and Coronary Angiography;  Surgeon: [name], MD;  Location: [facility] CV  LAB;  Service: Cardiovascular;  Laterality: N/A;  . CORONARY ARTERY BYPASS GRAFT N/A 10/29/2015   Procedure: CORONARY ARTERY BYPASS GRAFTING (CABG) x Four UTILIZING THE LEFT INTERNAL MAMMARY ARTERY AND ENDOSCOPICALLY HARVESTED RIGHT SAPEHENEOUS VEINS.;  Surgeon: [name], MD;   Location: [facility];  Service: Open Heart Surgery;  Laterality: N/A;  . CYST REMOVAL HAND Right   . REPLACEMENT ASCENDING AORTA N/A 10/29/2015   Procedure: REPLACEMENT OF ASCENDING AORTA USING 34MM X 30CM WOVEN DOUBLE VELOUR VASCULAR GRAFT.;  Surgeon: [name], MD;  Location: [facility];  Service: Open Heart Surgery;  Laterality: N/A;  . TEE WITHOUT CARDIOVERSION N/A 10/29/2015   Procedure: TRANSESOPHAGEAL ECHOCARDIOGRAM (TEE);  Surgeon: [name], MD;  Location: [facility];  Service: Open Heart Surgery;  Laterality: N/A;       Home Medications    Prior to Admission medications   Medication Sig Start Date End Date Taking? Authorizing Provider  amiodarone (PACERONE) 200 MG tablet Take 1 tablet (200 mg total) by mouth daily. Patient taking differently: Take 200 mg by mouth 2 (two) times daily.  11/23/15  Yes Thayer Headings, MD  atorvastatin (LIPITOR) 40 MG tablet Take 1 tablet (40 mg total) by mouth daily. 07/29/15  Yes Thayer Headings, MD  famotidine (PEPCID AC) 10 MG chewable tablet Chew 1 tablet (10 mg total) by mouth 2 (two) times daily as needed for heartburn. 10/11/15  Yes Thayer Headings, MD  ferrous gluconate (FERGON) 324 MG tablet Take 1 tablet (324 mg total) by mouth 2 (two) times daily with a meal. 11/08/15  Yes Wayne E Gold, PA-C  folic acid (FOLVITE) 1 MG tablet Take 1 tablet (1 mg total) by mouth daily. 11/08/15  Yes Wayne E Gold, PA-C  glucose blood test strip Use as instructed 12/25/14  Yes [name], NP  Insulin Glargine (LANTUS SOLOSTAR) 100 UNIT/ML Solostar Pen Inject 25 Units into the skin daily at 10 pm. 09/09/14  Yes Doe-Hyun R Shawna Orleans, DO  insulin lispro (HUMALOG KWIKPEN) 100 UNIT/ML KiwkPen Use 5 to 10 units three times daily 10-15 minutes before meals Patient taking differently: Inject 5-10 Units into the skin 3 (three) times daily before meals.  09/09/14  Yes Doe-Hyun R Shawna Orleans, DO  metFORMIN (GLUCOPHAGE) 1000 MG tablet Take 1 tablet (1,000 mg total) by mouth 2 (two) times  daily with a meal. 09/07/15  Yes [name], NP  midodrine (PROAMATINE) 10 MG tablet Take 1 tablet (10 mg total) by mouth 3 (three) times daily with meals. 11/20/15  Yes Modena Jansky, MD  ondansetron (ZOFRAN ODT) 4 MG disintegrating tablet Take 1 tablet (4 mg total) by mouth every 8 (eight) hours as needed for nausea or vomiting. 11/11/15  Yes [name], MD  oxyCODONE (OXY IR/ROXICODONE) 5 MG immediate release tablet Take 1-2 tablets (5-10 mg total) by mouth every 6 (six) hours as needed for severe pain. 11/08/15  Yes Wayne E Gold, PA-C  rivaroxaban (XARELTO) 20 MG TABS tablet Take 1 tablet (20 mg total) by mouth daily with supper. 11/08/15  Yes Erin R [name], PA-C    Family History Family History  Problem Relation Age of Onset  . Cancer Father     Throat cancer  . Diabetes Sister     Social History Social History  Substance Use Topics  . Smoking status: Never Smoker  . Smokeless tobacco: Never Used  . Alcohol use No     Allergies   No known allergies   Review of  Systems Review of Systems  Ten systems reviewed and are negative for acute change, except as noted in the HPI.   Physical Exam Updated Vital Signs BP 114/58   Pulse 74   Temp 98.3 F (36.8 C) (Oral)   Resp (!) 8   Ht 6' (1.829 m)   Wt 81.2 kg   SpO2 97%   BMI 24.28 kg/m   Physical Exam  Constitutional: He appears well-developed and well-nourished. No distress.  HENT:  Head: Normocephalic and atraumatic.  Eyes: Conjunctivae are normal. No scleral icterus.  Neck: Normal range of motion. Neck supple.  Cardiovascular: Normal heart sounds.   Irregular rhythm  Pulmonary/Chest: Effort normal and breath sounds normal. No respiratory distress.  Abdominal: Soft. He exhibits no distension. There is no tenderness.  Musculoskeletal: He exhibits no edema.  Neurological: He is alert.  Skin: Skin is warm and dry. He is not diaphoretic.  Psychiatric: His behavior is normal.  Nursing note and vitals  reviewed.    ED Treatments / Results  Labs (all labs ordered are listed, but only abnormal results are displayed) Labs Reviewed  PROTIME-INR - Abnormal; Notable for the following:       Result Value   Prothrombin Time 27.7 (*)    All other components within normal limits  CBC - Abnormal; Notable for the following:    WBC 15.8 (*)    RBC 3.87 (*)    Hemoglobin 11.1 (*)    HCT 33.8 (*)    All other components within normal limits  COMPREHENSIVE METABOLIC PANEL - Abnormal; Notable for the following:    Chloride 100 (*)    Glucose, Bld 144 (*)    Creatinine, Ser 1.55 (*)    Albumin 3.2 (*)    GFR calc non Af Amer 44 (*)    GFR calc Af Amer 51 (*)    All other components within normal limits  URINALYSIS, ROUTINE W REFLEX MICROSCOPIC (NOT AT [facility]) - Abnormal; Notable for the following:    APPearance TURBID (*)    Hgb urine dipstick LARGE (*)    Ketones, ur 15 (*)    Protein, ur 30 (*)    Leukocytes, UA LARGE (*)    All other components within normal limits  URINE MICROSCOPIC-ADD ON - Abnormal; Notable for the following:    Squamous Epithelial / LPF 0-5 (*)    Bacteria, UA MANY (*)    All other components within normal limits  I-STAT CG4 LACTIC ACID, ED - Abnormal; Notable for the following:    Lactic Acid, Venous 2.21 (*)    All other components within normal limits  CULTURE, BLOOD (ROUTINE X 2)  CULTURE, BLOOD (ROUTINE X 2)  MAGNESIUM  LIPASE, BLOOD  I-STAT CG4 LACTIC ACID, ED    EKG  EKG Interpretation  Date/Time:  Wednesday November 24 2015 11:57:38 EDT Ventricular Rate:  128 PR Interval:    QRS Duration: 95 QT Interval:  320 QTC Calculation: 467 R Axis:   74 Text Interpretation:  Atrial flutter with predominant 2:1 AV block Anteroseptal infarct, old Abnormal T, consider ischemia, diffuse leads Confirmed by Jeneen Rinks  MD, [place] ([id number]) on 11/24/2015 4:13:57 PM        Radiology Ct Head Wo Contrast  Result Date: 11/24/2015 CLINICAL DATA:  Headache and dizziness  since bypass surgery on 10/29/2015. EXAM: CT HEAD WITHOUT CONTRAST TECHNIQUE: Contiguous axial images were obtained from the base of the skull through the vertex without intravenous contrast. COMPARISON:  11/13/2015 FINDINGS: Brain: The ventricles  are normal in size and configuration. No extra-axial fluid collections are identified. The gray-white differentiation is normal. No CT findings for acute intracranial process such as hemorrhage or infarction. No mass lesions. The brainstem and cerebellum are grossly normal. Vascular: Minimal scattered vascular calcifications. No aneurysm or hyperdense vessels. Skull: No skull fracture or bone lesions. Sinuses/Orbits: The paranasal sinuses and mastoid air cells are clear. The globes are intact. Other: No scalp lesion or hematoma. IMPRESSION: Normal and stable head CT. Electronically Signed   By: Marijo Sanes M.D.   On: 11/24/2015 15:56   Dg Chest Port 1 View  Result Date: 11/24/2015 CLINICAL DATA:  Fever and atrial fibrillation EXAM: PORTABLE CHEST 1 VIEW COMPARISON:  November 14, 2015 FINDINGS: There is airspace consolidation in the left lower lobe consistent with pneumonia. There is a small associated left pleural effusion. The lungs elsewhere are clear. Heart is upper normal in size with pulmonary vascularity within normal limits. The patient is status post aortic valve replacement. The patient is also status post coronary artery bypass grafting. No adenopathy. There is evidence of old healed fracture of the left clavicle. IMPRESSION: Lobe airspace consolidation consistent with pneumonia. Small left pleural effusion. Lungs elsewhere clear. Stable cardiac silhouette. Followup PA and lateral chest radiographs recommended in 3-4 weeks following trial of antibiotic therapy to ensure resolution and exclude underlying malignancy. Electronically Signed   By: Lowella Grip III M.D.   On: 11/24/2015 14:00    Procedures .Critical Care Performed by: Margarita Mail Authorized by: Margarita Mail   Critical care provider statement:    Critical care time (minutes):  45   Critical care was necessary to treat or prevent imminent or life-threatening deterioration of the following conditions:  Cardiac failure   Critical care was time spent personally by me on the following activities:  Development of treatment plan with patient or surrogate, discussions with consultants, examination of patient, evaluation of patient's response to treatment, interpretation of cardiac output measurements, review of old charts, re-evaluation of patient's condition, pulse oximetry, ordering and review of radiographic studies, ordering and review of laboratory studies and ordering and performing treatments and interventions   (including critical care time)  Medications Ordered in ED Medications  amiodarone (NEXTERONE) 1.8 mg/mL load via infusion 150 mg (150 mg Intravenous Bolus from Bag 11/24/15 1410)    Followed by  amiodarone (NEXTERONE PREMIX) 360-4.14 MG/200ML-% (1.8 mg/mL) IV infusion (60 mg/hr Intravenous New Bag/Given 11/24/15 1642)    Followed by  amiodarone (NEXTERONE PREMIX) 360-4.14 MG/200ML-% (1.8 mg/mL) IV infusion (not administered)  vancomycin (VANCOCIN) 1,500 mg in sodium chloride 0.9 % 500 mL IVPB (1,500 mg Intravenous New Bag/Given 11/24/15 1743)  vancomycin (VANCOCIN) IVPB 750 mg/150 ml premix (not administered)  ceFEPIme (MAXIPIME) 1 g in dextrose 5 % 50 mL IVPB (not administered)  sodium chloride 0.9 % bolus 1,000 mL (0 mLs Intravenous Stopped 11/24/15 1337)  sodium chloride 0.9 % bolus 500 mL (0 mLs Intravenous Stopped 11/24/15 1743)  ondansetron (ZOFRAN) injection 4 mg (4 mg Intravenous Given 11/24/15 1417)  ceFEPIme (MAXIPIME) 2 g in dextrose 5 % 50 mL IVPB (0 g Intravenous Stopped 11/24/15 1743)   CHA2DS2/VAS Stroke Risk Points      3 >= 2 Points: High Risk  1 - 1.99 Points: Medium Risk  0 Points: Low Risk    The patient's score has not  changed in the past year.:  No Change         Details    Note: External data might  be a factor in metrics not marked with    Points Metrics   This score determines the patient's risk of having a stroke if the  patient has atrial fibrillation.       0 Has Congestive Heart Failure:  No   1 Has Vascular Disease:  Yes   1 Has Hypertension:  Yes   0 Age:  62   1 Has Diabetes:  Yes   0 Had Stroke:  No Had TIA:  No Had thromboembolism:  No   0 Male:  No          Initial Impression / Assessment and Plan / ED Course  I have reviewed the triage vital signs and the nursing notes.  Pertinent labs & imaging results that were available during my care of the patient were reviewed by me and considered in my medical decision making (see chart for details).  Clinical Course  Value Comment By Time  WBC: (!) 15.8 Increased leukocytosis Margarita Mail, PA-C 10/11 1311  Creatinine: (!) 1.55 CR has nearly doub;ed Margarita Mail, PA-C 10/11 1312  Lactic Acid, Venous: (!!) 2.21 Lactic acid elevated, likely secondary to dehydration Margarita Mail, PA-C 10/11 1358    Patient with A. fib with RVR. Cardiology has been contacted and will see the patient. Started on amiodarone drip secondary to his low blood pressures. Chest x-ray today shows a left lower lobe pneumonia. Given his recent hospitalization. She will be treated for age. I placed a consult for admission for the patient. \  Patient to be admitted by Dr. Carles Collet.  He appears to have H And also does have a urinary tract infection. Elevated white blood cell count. Stable throughout visit. In the ED. Final Clinical Impressions(s) / ED Diagnoses   Final diagnoses:  A-fib [facility])    New Prescriptions New Prescriptions   No medications on file     Margarita Mail, PA-C 11/24/15 1753    Tanna Furry, MD 12/03/15 1020

## 2015-11-24 NOTE — ED Triage Notes (Signed)
Patient comes in with c/o dizziness and n/v. Hx of CABG (triple bypass) on the 15th last month and AAA repair. Patient took his BP upon standing and was 80/40. EMS BP was 112/62. Denies chest pain, SOB, back pain. Hx of uncontrolled afib. Takes xarelto daily. Last dose today. SOB with ambulation. Nausea and vomiting started yesterday. 20 in the left hand.

## 2015-11-24 NOTE — Telephone Encounter (Signed)
Pt did arrive at the ED.

## 2015-11-24 NOTE — Consult Note (Signed)
CARDIOLOGY CONSULT NOTE  Patient ID: [name] MRN: [id number] DOB/AGE: 03/10/1945 70 y.o.  Admit date: 11/24/2015 Primary Physician Drema Pry, DO Primary Cardiologist Dr. 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  He has CAD with CABG as well.  He has had atrial fib.  This was treated with amiodarone and anticoagulation.  He was discharged on 10/7 after treatment for N/V and dehydration.  He had significant orthostatic hypotension treated with midodrine and TED hose.  Antihypertensives were held.   The etiology of the N/V was not identified.  He called our office yesterday.  He had been having vomiting again.  He was still very orthostatic.  He was told to reduce his amiodarone.  He also called the PCP office.   He reports that he has had N/V since discharge and has not been able to keep anything down.  He has been very week and presyncopal with any standing.  He has had no pain.  He denies fevers or chills.  He has had watery stools since surgery.  He does not feel palpitations and would not know if he is in fib.  There has been no new shortness of breath, PND or orthopnea.   In the ED he was started on IV amiodarone and is now in NSR.    Lab Results  Component Value Date   HGBA1C 11.2 (H) 10/29/2015      Past Medical History:  Diagnosis Date  . 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 Cancer ([place])   . Carpal tunnel syndrome 09/24/2014   Bilateral  . 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 Diabetes mellitus, type 2 ([place])   . Erectile dysfunction   . Essential hypertension   . Hyperlipidemia     Past Surgical History:  Procedure Laterality Date  . AORTIC VALVE REPLACEMENT N/A 10/29/2015   Procedure: AORTIC VALVE REPLACEMENT (AVR) WITH 23MM MAGNA EASE TISSUE VALVE.;  Surgeon: [name], MD;  Location: [facility];   Service: Open Heart Surgery;  Laterality: N/A;  . CARDIAC CATHETERIZATION N/A 10/27/2015   Procedure: Left Heart Cath and Coronary Angiography;  Surgeon: [name], MD;  Location: [facility];  Service: Cardiovascular;  Laterality: N/A;  . CORONARY ARTERY BYPASS GRAFT N/A 10/29/2015   Procedure: CORONARY ARTERY BYPASS GRAFTING (CABG) x Four UTILIZING THE LEFT INTERNAL MAMMARY ARTERY AND ENDOSCOPICALLY HARVESTED RIGHT SAPEHENEOUS VEINS.;  Surgeon: [name], MD;  Location: [facility];  Service: Open Heart Surgery;  Laterality: N/A;  . CYST REMOVAL HAND Right   . REPLACEMENT ASCENDING AORTA N/A 10/29/2015   Procedure: REPLACEMENT OF ASCENDING AORTA USING 34MM X 30CM WOVEN DOUBLE VELOUR VASCULAR GRAFT.;  Surgeon: [name], MD;  Location: [facility];  Service: Open Heart Surgery;  Laterality: N/A;  . TEE WITHOUT CARDIOVERSION N/A 10/29/2015   Procedure: TRANSESOPHAGEAL ECHOCARDIOGRAM (TEE);  Surgeon: [name], MD;  Location: [facility];  Service: Open Heart Surgery;  Laterality: N/A;    Allergies  Allergen Reactions  . No Known Allergies    Prior to Admission medications   Medication Sig Start Date End Date Taking? Authorizing Provider  amiodarone (PACERONE) 200 MG tablet Take 1 tablet (200 mg total) by mouth daily.   11/23/15  Yes Thayer Headings, MD  atorvastatin (LIPITOR) 40 MG tablet Take  1 tablet (40 mg total) by mouth daily. 07/29/15  Yes Thayer Headings, MD  famotidine (PEPCID AC) 10 MG chewable tablet Chew 1 tablet (10 mg total) by mouth 2 (two) times daily as needed for heartburn. 10/11/15  Yes Thayer Headings, MD  ferrous gluconate (FERGON) 324 MG tablet Take 1 tablet (324 mg total) by mouth 2 (two) times daily with a meal. 11/08/15  Yes Wayne E Gold, PA-C  folic acid (FOLVITE) 1 MG tablet Take 1 tablet (1 mg total) by mouth daily. 11/08/15  Yes Wayne E Gold, PA-C  glucose blood test strip Use as instructed 12/25/14  Yes [name], NP  Insulin Glargine (LANTUS  SOLOSTAR) 100 UNIT/ML Solostar Pen Inject 25 Units into the skin daily at 10 pm. 09/09/14  Yes Doe-Hyun R Shawna Orleans, DO  insulin lispro (HUMALOG KWIKPEN) 100 UNIT/ML KiwkPen Use 5 to 10 units three times daily 10-15 minutes before meals Patient taking differently: Inject 5-10 Units into the skin 3 (three) times daily before meals.  09/09/14  Yes Doe-Hyun R Shawna Orleans, DO  metFORMIN (GLUCOPHAGE) 1000 MG tablet Take 1 tablet (1,000 mg total) by mouth 2 (two) times daily with a meal. 09/07/15  Yes [name], NP  midodrine (PROAMATINE) 10 MG tablet Take 1 tablet (10 mg total) by mouth 3 (three) times daily with meals. 11/20/15  Yes Modena Jansky, MD  ondansetron (ZOFRAN ODT) 4 MG disintegrating tablet Take 1 tablet (4 mg total) by mouth every 8 (eight) hours as needed for nausea or vomiting. 11/11/15  Yes [name], MD  oxyCODONE (OXY IR/ROXICODONE) 5 MG immediate release tablet Take 1-2 tablets (5-10 mg total) by mouth every 6 (six) hours as needed for severe pain. 11/08/15  Yes Wayne E Gold, PA-C  rivaroxaban (XARELTO) 20 MG TABS tablet Take 1 tablet (20 mg total) by mouth daily with supper. 11/08/15  Yes Erin R [name], PA-C     (Not in a hospital admission) Family History  Problem Relation Age of Onset  . Cancer Father     Throat cancer  . Diabetes Sister     Social History   Social History  . Marital status: Married    Spouse name: N/A  . Number of children: N/A  . Years of education: N/A   Occupational History  . Not on file.   Social History Main Topics  . Smoking status: Never Smoker  . Smokeless tobacco: Never Used  . Alcohol use No  . Drug use: No  . Sexual activity: Yes   Other Topics Concern  . Not on file   Social History Narrative   Married to [name] for 15 years   Retired as Chiropodist   5 children   Never Smoked   Alcohol use- no   Regular exercise: walks around the block ea day   Caffeine use: occasional      ROS:    As stated in the HPI and  negative for all other systems.  Physical Exam: Blood pressure 101/61, pulse 77, temperature 98.3 F (36.8 C), temperature source Oral, resp. rate 14, height 6' (1.829 m), weight 179 lb (81.2 kg), SpO2 98 %.  GENERAL:  Well appearing HEENT:  Pupils equal round and reactive, fundi not visualized, oral mucosa unremarkable NECK:  No jugular venous distention, waveform within normal limits, carotid upstroke brisk and symmetric, no bruits, no thyromegaly LYMPHATICS:  No cervical, inguinal adenopathy LUNGS:  Clear to auscultation bilaterally BACK:  No CVA tenderness CHEST:  Healing  surgical scar HEART:  PMI not displaced or sustained,S1 and S2 within normal limits, no S3, no S4, no clicks, no rubs, 2/6 apical systolic murmur, no diastolic murmurs ABD:  Flat, positive bowel sounds normal in frequency in pitch, no bruits, no rebound, no guarding, no midline pulsatile mass, no hepatomegaly, no splenomegaly EXT:  2 plus pulses throughout, no edema, no cyanosis no clubbing SKIN:  No rashes no nodules NEURO:  Cranial nerves II through XII grossly intact, motor grossly intact throughout PSYCH:  Cognitively intact, oriented to person place and time   Labs: Lab Results  Component Value Date   BUN 11 11/24/2015   Lab Results  Component Value Date   CREATININE 1.55 (H) 11/24/2015   Lab Results  Component Value Date   NA 135 11/24/2015   K 5.1 11/24/2015   CL 100 (L) 11/24/2015   CO2 25 11/24/2015    Lab Results  Component Value Date   WBC 15.8 (H) 11/24/2015   HGB 11.1 (L) 11/24/2015   HCT 33.8 (L) 11/24/2015   MCV 87.3 11/24/2015   PLT 367 11/24/2015   Lab Results  Component Value Date   CHOL 228 (H) 07/23/2015   HDL 98 07/23/2015   LDLCALC 115 07/23/2015   LDLDIRECT 112.6 05/14/2012   TRIG 77 07/23/2015   CHOLHDL 2.3 07/23/2015   Lab Results  Component Value Date   ALT 28 11/24/2015   AST 22 11/24/2015   ALKPHOS 84 11/24/2015   BILITOT 1.1 11/24/2015      Radiology:    CXR: There is airspace consolidation in the left lower lobe consistent with pneumonia. There is a small associated left pleural effusion. The lungs elsewhere are clear. Heart is upper normal in size with pulmonary vascularity within normal limits. The patient is status post aortic valve replacement. The patient is also status post coronary artery bypass grafting. No adenopathy. There is evidence of old healed fracture of the left clavicle.  EKG:    Atrial fib, rate 128, borderline IVCD.    ASSESSMENT AND PLAN:   HYPOTENSION:  Orthostatic.  Likely related to long standing poorly controlled diabetes.  As discussed with Dr. Carles Collet this would explain his N/V as well with gastroparesis.  At this point we would need to continue the conservative measure that were instituted with possibly the addition of an abdominal binder.  For now admit for hydration and control of N/V  ATRIAL FIB:  Currently in NSR with IV amiodarone.  Continue tonight and then possibly I will switch to PO in the AM.  I do not suspect that this is the primary cause of the symptoms.  Continue with anticoagulation.     SignedMinus Breeding 11/24/2015, 3:42 PM

## 2015-11-24 NOTE — Telephone Encounter (Signed)
Patient scheduled today with [name] at 11:30am. Left voicemail on patient's phone to call office. Will watch if he arrives to office or ED.

## 2015-11-24 NOTE — Progress Notes (Signed)
Pharmacy Antibiotic Note  [name] is a 70 y.o. male admitted on 11/24/2015 with pneumonia.  Pharmacy has been consulted for vancomycin dosing. SCr slightly elevated at 1.55, CrCl ~20ml/min  Plan: Give vancomycin 1.5g IV x 1, then start vancomycin 750mg  IV Q12 Give cefepime 2g IV x 1, then start cefepime 1g IV Q8 Monitor clinical picture, renal function, VT prn F/U C&S, abx deescalation / LOT   Height: 6' (182.9 cm) Weight: 179 lb (81.2 kg) IBW/kg (Calculated) : 77.6  Temp (24hrs), Avg:98.3 F (36.8 C), Min:98.3 F (36.8 C), Max:98.3 F (36.8 C)   Recent Labs Lab 11/24/15 1227 11/24/15 1233 11/24/15 1318 11/24/15 1639  WBC  --  15.8*  --   --   CREATININE 1.55*  --   --   --   LATICACIDVEN  --   --  2.21* 1.44    Estimated Creatinine Clearance: 50.1 mL/min (by C-G formula based on SCr of 1.55 mg/dL (H)).    Allergies  Allergen Reactions  . No Known Allergies     Antimicrobials this admission: Cefepime 10/11 >>  Vancomycin 10/11 >>   Dose adjustments this admission: n/a  Microbiology results: 10/11 BCx: sent  Thank you for allowing pharmacy to be a part of this patient's care.  Elenor Quinones, PharmD, BCPS Clinical Pharmacist Pager [phone] 11/24/2015 5:09 PM

## 2015-11-24 NOTE — H&P (Signed)
History and Physical  [name] [id number] DOB: 1945/11/19 DOA: 11/24/2015   PCP: Drema Pry, DO    Patient coming from: Home  Chief Complaint: N/V  HPI:  [name] is a 70 y.o. male with medical history of 70 year old male with PMH of aortic stenosis, CAD, type II DM, essential hypertension, hyperlipidemia, recently discharged from the hospital on 11/08/15 status post 4 vessel CABG and AVR ([employer] pericardial tissue valve) for management of CAD and aortic stenosis. Postoperative course was complicated by PAF which was treated with amiodarone and Xarelto. The patient was also recently discharged on 11/20/2015 after having nine-day hospital stay for which she was treated for orthostatic hypotension, nausea and vomiting, and UTI. The patient was discharged home with amiodarone and rivaroxaban. In addition, Midodrine was started on 11/18/2015 and titrated up to 10 mg 3 times a day. In addition, the patient's ARB, diuretics, and beta blockers were discontinued due to his orthostasis. The patient presented again with 2-3 day history of nausea and vomiting. He states that this was not particularly precipitated by any oral intake or liquids, but on occasion oral intake does make his vomiting worse. He denied any fevers, chills, chest pain, shortness breath, abdominal pain, dysuria, hematuria. He stated that his nausea and vomiting were precipitated by his dizziness which is triggered by positional changes.  Upon presentation, the patient was noted to have WBC 15.8 with serum creatinine 1.55 and potassium 5.1. His lactic acid was 2.21 with INR 2.52. EKG showed atrial flutter with 2:1 conduction. The patient was given 1.5 L normal saline in the emergency department. Because of his soft blood pressures, the patient was started on amiodarone drip. Cardiology was consulted.  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 from his multiple episodes of medical issues in the past 1-2 months -Continue antiemetics -Clear liquids for now -His diabetes mellitus is poorly controlled with hemoglobin A1c 11.2 on 10/29/2015 which has also contributed to his functional gastroparesis -add PPI -add reglan if no improvement  Acute kidney injury -Secondary to volume depletion -Presenting creatinine 1.55 -Continue IV fluids  Pulmonary Infiltrate -Not completely convinced the patient has pneumonia as he is not hypoxemic nor short of breath -Certainly, with the patient's recurrent vomiting he may have a component of aspiration pneumonitis -Procalcitonin  -Continue Zosyn for now pending clarity of his clinical progress   Diarrhea -Check C. difficile in the setting of recent antibiotic use for UTI  Near syncope  -related orthostatic hypotension - Due to several days' history of nausea, vomiting and diarrhea -Thigh high TED hose was placed. -Continue IV fluids  - He has been repeatedly counseled regarding measures to minimize her symptoms from orthostatic hypotension: Gradual transition from supine to sitting to standing and ambulating, adequate hydration, increasing salt intake, elevating legs up when he is sitting, where TED hose when ambulating. He verbalizes understanding.  Uncontrolled DM 2 - Hemoglobin A1c 10/29/15: 11.2. Metformin held due to current acute illness. Placed on reduced dose Lantus and SSIIn the hospital. -Reasonable inpatient control. - Resume prior home medications at discharge.  Essential hypertension - Controlled. Orthostatic changes. Started midodrine 10/5 and titrated up to 10 MG 3 times a day today. Change lower extremity TED hoses to thigh high.  Paroxysmal A. 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  - LIMA to LAD, SVG to first and second OM, SVG to RCA.          Past Medical History:  Diagnosis Date  . 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 Cancer ([place])   . Carpal tunnel syndrome 09/24/2014   Bilateral  . 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 Diabetes mellitus, type 2 ([place])   . Erectile dysfunction   . Essential hypertension   . Hyperlipidemia    Past Surgical History:  Procedure Laterality Date  . AORTIC VALVE REPLACEMENT N/A 10/29/2015   Procedure: AORTIC VALVE REPLACEMENT (AVR) WITH 23MM MAGNA EASE TISSUE VALVE.;  Surgeon: [name], MD;  Location: [facility];  Service: Open Heart Surgery;  Laterality: N/A;  . CARDIAC CATHETERIZATION N/A 10/27/2015   Procedure: Left Heart Cath and Coronary Angiography;  Surgeon: [name], MD;  Location: [facility];  Service: Cardiovascular;  Laterality: N/A;  . CORONARY ARTERY BYPASS GRAFT N/A 10/29/2015   Procedure: CORONARY ARTERY BYPASS GRAFTING (CABG) x Four UTILIZING THE LEFT INTERNAL MAMMARY ARTERY AND ENDOSCOPICALLY HARVESTED RIGHT SAPEHENEOUS VEINS.;  Surgeon: [name], MD;  Location: [facility];  Service: Open Heart Surgery;  Laterality: N/A;  . CYST REMOVAL HAND Right   . REPLACEMENT ASCENDING AORTA N/A 10/29/2015   Procedure: REPLACEMENT OF ASCENDING AORTA USING 34MM X 30CM WOVEN DOUBLE VELOUR VASCULAR GRAFT.;  Surgeon: [name], MD;  Location: [facility];  Service: Open Heart Surgery;  Laterality: N/A;  . TEE WITHOUT CARDIOVERSION N/A 10/29/2015   Procedure: TRANSESOPHAGEAL ECHOCARDIOGRAM (TEE);  Surgeon: [name], MD;  Location: [facility];  Service: Open Heart Surgery;  Laterality: N/A;   Social History:  reports that he has never smoked. He has never used smokeless tobacco. He reports that he does  not drink alcohol or use drugs.   Family History  Problem Relation Age of Onset  . Cancer Father     Throat cancer  . Diabetes Sister      Allergies  Allergen Reactions  . No Known Allergies      Prior to Admission medications   Medication Sig Start Date End Date Taking? Authorizing Provider  amiodarone (PACERONE) 200 MG tablet Take 1 tablet (200 mg total) by mouth daily. Patient taking differently: Take 200 mg by mouth 2 (two) times daily.  11/23/15  Yes Thayer Headings, MD  atorvastatin (LIPITOR) 40 MG tablet Take 1 tablet (40 mg total) by mouth daily. 07/29/15  Yes Thayer Headings, MD  famotidine (PEPCID AC) 10 MG chewable tablet Chew 1 tablet (10 mg total) by mouth 2 (two) times daily as needed for heartburn. 10/11/15  Yes Thayer Headings, MD  ferrous gluconate (FERGON) 324 MG tablet Take 1 tablet (324 mg total) by mouth 2 (two) times daily with a meal. 11/08/15  Yes Wayne E Gold, PA-C  folic acid (FOLVITE) 1 MG tablet Take 1 tablet (1 mg total) by mouth daily. 11/08/15  Yes Wayne E Gold, PA-C  glucose blood test strip Use as instructed 12/25/14  Yes [name], NP  Insulin Glargine (LANTUS SOLOSTAR) 100 UNIT/ML Solostar Pen Inject 25 Units into the skin daily at 10 pm. 09/09/14  Yes Doe-Hyun R Shawna Orleans, DO  insulin lispro (HUMALOG KWIKPEN) 100 UNIT/ML KiwkPen Use 5 to 10 units  three times daily 10-15 minutes before meals Patient taking differently: Inject 5-10 Units into the skin 3 (three) times daily before meals.  09/09/14  Yes Doe-Hyun R Shawna Orleans, DO  metFORMIN (GLUCOPHAGE) 1000 MG tablet Take 1 tablet (1,000 mg total) by mouth 2 (two) times daily with a meal. 09/07/15  Yes [name], NP  midodrine (PROAMATINE) 10 MG tablet Take 1 tablet (10 mg total) by mouth 3 (three) times daily with meals. 11/20/15  Yes Modena Jansky, MD  ondansetron (ZOFRAN ODT) 4 MG disintegrating tablet Take 1 tablet (4 mg total) by mouth every 8 (eight) hours as needed for nausea or vomiting. 11/11/15  Yes  [name], MD  oxyCODONE (OXY IR/ROXICODONE) 5 MG immediate release tablet Take 1-2 tablets (5-10 mg total) by mouth every 6 (six) hours as needed for severe pain. 11/08/15  Yes Wayne E Gold, PA-C  rivaroxaban (XARELTO) 20 MG TABS tablet Take 1 tablet (20 mg total) by mouth daily with supper. 11/08/15  Yes Erin R [name], PA-C    Review of Systems:  Constitutional:  No weight loss, night sweats, fatigue.  Head&Eyes: No headache.  No vision loss.  No eye pain or scotoma ENT:  No Difficulty swallowing,Tooth/dental problems,Sore throat,  No ear ache, post nasal drip,  Cardio-vascular:  No chest pain, Orthopnea, PND, swelling in lower extremities, palpitations  GI:  No  abdominal pain,  hematochezia, melena, heartburn, indigestion, Resp:  No shortness of breath with exertion or at rest.  No coughing up of blood .No wheezing.No chest wall deformity  Skin:  no rash or lesions.  GU:  no dysuria, change in color of urine, no urgency or frequency. No flank pain.  Musculoskeletal:  No joint pain or swelling. No decreased range of motion. No back pain.  Psych:  No change in mood or affect. No depression or anxiety. Neurologic: No headache, no dysesthesia, no focal weakness, no vision loss. No syncope  Physical Exam: Vitals:   11/24/15 1309 11/24/15 1315 11/24/15 1330 11/24/15 1515  BP: 109/65 125/66 102/70 101/61  Pulse: 62 60 101 77  Resp: 22 12 24 14   Temp:      TempSrc:      SpO2: 96% 96% 97% 98%  Weight:      Height:       General:  A&O x 3, NAD, nontoxic, pleasant/cooperative Head/Eye: No conjunctival hemorrhage, no icterus, [place]/AT, No nystagmus ENT:  No icterus,  No thrush, good dentition, no pharyngeal exudate Neck:  No masses, no lymphadenpathy, no bruits CV:  RRR, no rub, no gallop, no S3 Lung:  Left basilar crackles. No wheezing. Good air movement  Abdomen: soft/NT, +BS, nondistended, no peritoneal signs Ext: No cyanosis, No rashes, No petechiae, No lymphangitis,  No edema Neuro: CNII-XII intact, strength 4/5 in bilateral upper and lower extremities, no dysmetria  Labs on Admission:  Basic Metabolic Panel:  Recent Labs Lab 11/24/15 1227 11/24/15 1233  NA 135  --   K 5.1  --   CL 100*  --   CO2 25  --   GLUCOSE 144*  --   BUN 11  --   CREATININE 1.55*  --   CALCIUM 9.1  --   MG  --  1.8   Liver Function Tests:  Recent Labs Lab 11/24/15 1227  AST 22  ALT 28  ALKPHOS 84  BILITOT 1.1  PROT 7.3  ALBUMIN 3.2*    Recent Labs Lab 11/24/15 1227  LIPASE 29   No results for  input(s): AMMONIA in the last 168 hours. CBC:  Recent Labs Lab 11/24/15 1233  WBC 15.8*  HGB 11.1*  HCT 33.8*  MCV 87.3  PLT 367   Coagulation Profile:  Recent Labs Lab 11/24/15 1233  INR 2.52   Cardiac Enzymes: No results for input(s): CKTOTAL, CKMB, CKMBINDEX, TROPONINI in the last 168 hours. BNP: Invalid input(s): POCBNP CBG:  Recent Labs Lab 11/19/15 1116 11/19/15 1634 11/19/15 2100 11/20/15 0622 11/20/15 1135  GLUCAP 244* 218* 125* 153* 183*   Urine analysis:    Component Value Date/Time   COLORURINE YELLOW 11/17/2015 1310   APPEARANCEUR CLEAR 11/17/2015 1310   LABSPEC 1.018 11/17/2015 1310   PHURINE 6.0 11/17/2015 1310   GLUCOSEU NEGATIVE 11/17/2015 1310   GLUCOSEU NEGATIVE 04/06/2010 1554   HGBUR LARGE (A) 11/17/2015 1310   BILIRUBINUR NEGATIVE 11/17/2015 1310   BILIRUBINUR Negative 04/03/2014 1642   KETONESUR 15 (A) 11/17/2015 1310   PROTEINUR 30 (A) 11/17/2015 1310   UROBILINOGEN 1.0 04/03/2014 1642   UROBILINOGEN 1.0 04/03/2014 0058   NITRITE POSITIVE (A) 11/17/2015 1310   LEUKOCYTESUR SMALL (A) 11/17/2015 1310   Sepsis Labs: @LABRCNTIP (procalcitonin:4,lacticidven:4) )No results found for this or any previous visit (from the past 240 hour(s)).   Radiological Exams on Admission: Ct Head Wo Contrast  Result Date: 11/24/2015 CLINICAL DATA:  Headache and dizziness since bypass surgery on 10/29/2015. EXAM: CT HEAD  WITHOUT CONTRAST TECHNIQUE: Contiguous axial images were obtained from the base of the skull through the vertex without intravenous contrast. COMPARISON:  11/13/2015 FINDINGS: Brain: The ventricles are normal in size and configuration. No extra-axial fluid collections are identified. The gray-white differentiation is normal. No CT findings for acute intracranial process such as hemorrhage or infarction. No mass lesions. The brainstem and cerebellum are grossly normal. Vascular: Minimal scattered vascular calcifications. No aneurysm or hyperdense vessels. Skull: No skull fracture or bone lesions. Sinuses/Orbits: The paranasal sinuses and mastoid air cells are clear. The globes are intact. Other: No scalp lesion or hematoma. IMPRESSION: Normal and stable head CT. Electronically Signed   By: Marijo Sanes M.D.   On: 11/24/2015 15:56   Dg Chest Port 1 View  Result Date: 11/24/2015 CLINICAL DATA:  Fever and atrial fibrillation EXAM: PORTABLE CHEST 1 VIEW COMPARISON:  November 14, 2015 FINDINGS: There is airspace consolidation in the left lower lobe consistent with pneumonia. There is a small associated left pleural effusion. The lungs elsewhere are clear. Heart is upper normal in size with pulmonary vascularity within normal limits. The patient is status post aortic valve replacement. The patient is also status post coronary artery bypass grafting. No adenopathy. There is evidence of old healed fracture of the left clavicle. IMPRESSION: Lobe airspace consolidation consistent with pneumonia. Small left pleural effusion. Lungs elsewhere clear. Stable cardiac silhouette. Followup PA and lateral chest radiographs recommended in 3-4 weeks following trial of antibiotic therapy to ensure resolution and exclude underlying malignancy. Electronically Signed   By: Lowella Grip III M.D.   On: 11/24/2015 14:00    EKG: Independently reviewed. Atrial fibrillation with nonspecific ST-T wave changes     Time spent:70  minutes Code Status:   FULL Family Communication:   Wife and daughter updated at bedside Disposition Plan: expect 2-3day hospitalization Consults called: cardiology DVT Prophylaxis:  Rivaroxaban   Rhiann Boucher, DO  [facility] Pager [phone]  If 7PM-7AM, please contact night-coverage [email or web address] Password [email or web address] 11/24/2015, 3:59 PM

## 2015-11-24 NOTE — ED Notes (Signed)
Nurse starting second IV so she will get 2nd set of blood cultures.

## 2015-11-24 NOTE — Telephone Encounter (Signed)
[facility]  Patient Name: [name]  DOB: [birth year]    Initial Comment caller states vomiting green and has urinated   Nurse Assessment  Nurse: Harlow Mares, RN, Suanne Marker Date/Time (Eastern Time): 11/24/2015 10:47:14 AM  Confirm and document reason for call. If symptomatic, describe symptoms. You must click the next button to save text entered. ---caller states vomiting green and has urinated.  Has the patient traveled out of the country within the last 30 days? ---Not Applicable  Does the patient have any new or worsening symptoms? ---Yes  Will a triage be completed? ---Yes  Related visit to physician within the last 2 weeks? ---Yes  Does the PT have any chronic conditions? (i.e. diabetes, asthma, etc.) ---Yes  List chronic conditions. ---previous heart surgery; diabetic  Is this a behavioral health or substance abuse call? ---No     Guidelines    Guideline Title Affirmed Question Affirmed Notes  Vomiting Shock suspected (e.g., cold/pale/clammy skin, too weak to stand, low BP, rapid pulse)    Final Disposition User   Call EMS 911 Now Harlow Mares, RN, Suanne Marker    Disagree/Comply: Leta Baptist

## 2015-11-24 NOTE — ED Provider Notes (Signed)
  Problem list: A Flutter with RVR--on Amiodarone qtt, Cardiology consulted by me, ER eval Pending. Dizziness- likely multifactorial.  CT tor/o Cerebellar CVa/bleed.  Sx more lightheaded/orthostatic                    than vertiginous.  LLL Pneumonia-- recent hospitalization, Tx for HCAP, given Vanco and Zosyn. Elevated Lactate--getting IVF, antibiotics.  CRITICAL CARE Performed by: Tanna Furry JOSEPH   Total critical care time: 30 minutes  Critical care time was exclusive of separately billable procedures and treating other patients.  Critical care was necessary to treat or prevent imminent or life-threatening deterioration.  Critical care was time spent personally by me on the following activities: development of treatment plan with patient and/or surrogate as well as nursing, discussions with consultants, evaluation of patient's response to treatment, examination of patient, obtaining history from patient or surrogate, ordering and performing treatments and interventions, ordering and review of laboratory studies, ordering and review of radiographic studies, pulse oximetry and re-evaluation of patient's condition.     Tanna Furry, MD 11/24/15 1556

## 2015-11-24 NOTE — ED Notes (Signed)
Delay on 2nd set of blood cultures MD examining pt.

## 2015-11-25 ENCOUNTER — Ambulatory Visit: Payer: Self-pay | Admitting: Adult Health

## 2015-11-25 ENCOUNTER — Encounter (HOSPITAL_COMMUNITY): Payer: Self-pay | Admitting: *Deleted

## 2015-11-25 DIAGNOSIS — J189 Pneumonia, unspecified organism: Secondary | ICD-10-CM

## 2015-11-25 DIAGNOSIS — E08311 Diabetes mellitus due to underlying condition with unspecified diabetic retinopathy with macular edema: Secondary | ICD-10-CM

## 2015-11-25 LAB — GLUCOSE, CAPILLARY
GLUCOSE-CAPILLARY: 168 mg/dL — AB (ref 65–99)
Glucose-Capillary: 126 mg/dL — ABNORMAL HIGH (ref 65–99)
Glucose-Capillary: 122 mg/dL — ABNORMAL HIGH (ref 65–99)
Glucose-Capillary: 141 mg/dL — ABNORMAL HIGH (ref 65–99)

## 2015-11-25 LAB — BASIC METABOLIC PANEL
ANION GAP: 8 (ref 5–15)
BUN: 8 mg/dL (ref 6–20)
CO2: 26 mmol/L (ref 22–32)
Calcium: 8.6 mg/dL — ABNORMAL LOW (ref 8.9–10.3)
Chloride: 102 mmol/L (ref 101–111)
Creatinine, Ser: 1.03 mg/dL (ref 0.61–1.24)
GFR calc Af Amer: >60 mL/min (ref >60)
GFR calc non Af Amer: >60 mL/min (ref >60)
GLUCOSE: 124 mg/dL — AB (ref 65–99)
POTASSIUM: 4.1 mmol/L (ref 3.5–5.1)
Sodium: 136 mmol/L (ref 135–145)

## 2015-11-25 LAB — CBC
HEMATOCRIT: 31.2 % — AB (ref 39.0–52.0)
HEMOGLOBIN: 10.1 g/dL — AB (ref 13.0–17.0)
MCH: 28.2 pg (ref 26.0–34.0)
MCHC: 32.4 g/dL (ref 30.0–36.0)
MCV: 87.2 fL (ref 78.0–100.0)
Platelets: 334 10*3/uL (ref 150–400)
RBC: 3.58 MIL/uL — ABNORMAL LOW (ref 4.22–5.81)
RDW: 14.7 % (ref 11.5–15.5)
WBC: 11.5 10*3/uL — ABNORMAL HIGH (ref 4.0–10.5)

## 2015-11-25 LAB — MAGNESIUM: Magnesium: 1.7 mg/dL (ref 1.7–2.4)

## 2015-11-25 LAB — CLOSTRIDIUM DIFFICILE BY PCR: Toxigenic C. Difficile by PCR: NEGATIVE

## 2015-11-25 MED ORDER — WHITE PETROLATUM GEL
Status: AC
  Administered 2015-11-25: 17:00:00
  Filled 2015-11-25: qty 1

## 2015-11-25 MED ORDER — AMIODARONE HCL 200 MG PO TABS
200.0000 mg | ORAL_TABLET | Freq: Every day | ORAL | Status: DC
  Administered 2015-11-25 – 2015-11-27 (×3): 200 mg via ORAL
  Filled 2015-11-25 (×3): qty 1

## 2015-11-25 NOTE — Progress Notes (Signed)
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 Postoperative course was complicated by PAF which was treated with amiodarone and Xarelto. The patient was also recently discharged on 11/20/2015 after having nine-day hospital stay for which she was treated for orthostatic hypotension, nausea and vomiting, and UTI. The patient was discharged home with amiodarone and rivaroxaban. In addition, Midodrine was started on 11/18/2015 and titrated up to 10 mg 3 times a day. In addition, the patient's ARB, diuretics, and beta blockers were discontinued due to his orthostasis. The patient presented again with 2-3 day history of nausea and vomiting. Patient found to have leukocytosis, HCAP, atrial flutter. Cardiology was consulted.    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 his multiple episodes of medical issues in the past 1-2 months -Continue antiemetics -Clear liquids for now -His diabetes mellitus is poorly controlled with hemoglobin A1c 11.2 on 10/29/2015 which has also contributed to his functional gastroparesis -add PPI -add reglan if no improvement  Acute kidney  injury, baseline creatinine 0.92, presented with a creatinine of 1.55 -Secondary to volume depletion Improving   Pulmonary Infiltrate -Not completely convinced the patient has pneumonia as he is not hypoxemic nor short of breath -Certainly, with the patient's recurrent vomiting he may have a component of aspiration pneumonitis -Procalcitonin 0.10 -Continue Zosyn for now , anticipate transition to oral antibiotic in the next 24 hours   Diarrhea -Check C. difficile in the setting of recent antibiotic use for UTI  Near syncope  Presented with atrial fibrillation and started on amiodarone drip, converted to normal sinus rhythm - Due to several days' history of nausea, vomiting and diarrhea Orthostatic.  Likely related to long standing poorly controlled diabetes.  - He has been repeatedly counseled regarding measures to minimize her symptoms from orthostatic hypotension: Gradual transition from supine to sitting to standing and ambulating, adequate hydration, increasing salt intake, elevating legs up when he is sitting, where TED hose when ambulating. He verbalizes understanding.  Uncontrolled DM 2 - Hemoglobin A1c 10/29/15: 11.2. Metformin held due to current acute illness. Placed on reduced dose Lantus and SSIIn the hospital. -Reasonable inpatient control. - Resume prior home medications at discharge.  Essential hypertension - Controlled. Orthostatic changes. Started midodrine 10/5 and titrated up to 10 MG 3 times a day today. Change lower extremity TED hoses to thigh high.  Paroxysmal A. 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  - LIMA to LAD, SVG to first  and second OM, SVG  to RCA.     DVT prophylaxsis Xarelto  Code Status:  Full code      Family Communication: Discussed in detail with the patient, all imaging results, lab results explained to the patient   Disposition Plan:  Transfer to telemetry once of amiodarone drip      Consultants:  Cardiology    Procedures:  None  Antibiotics: Anti-infectives    Start     Dose/Rate Route Frequency Ordered Stop   11/25/15 0600  vancomycin (VANCOCIN) IVPB 750 mg/150 ml premix  Status:  Discontinued     750 mg 150 mL/hr over 60 Minutes Intravenous Every 12 hours 11/24/15 1551 11/24/15 1842   11/25/15 0100  ceFEPIme (MAXIPIME) 1 g in dextrose 5 % 50 mL IVPB  Status:  Discontinued     1 g 100 mL/hr over 30 Minutes Intravenous Every 8 hours 11/24/15 1621 11/24/15 1842   11/24/15 2359  piperacillin-tazobactam (ZOSYN) IVPB 3.375 g     3.375 g 12.5 mL/hr over 240 Minutes Intravenous Every 8 hours 11/24/15 1817        HPI/Subjective: Denies any pain,still has some nausea but improving  Objective: Vitals:   11/24/15 2200 11/24/15 2300 11/24/15 2328 11/25/15 0442  BP: 118/61 121/66 121/66 132/65  Pulse: 73 77 77 73  Resp: 17 16 10 12   Temp:   98.7 F (37.1 C) 98.4 F (36.9 C)  TempSrc:   Oral Oral  SpO2: 98% 97% 98% 98%  Weight:      Height:        Intake/Output Summary (Last 24 hours) at 11/25/15 0755 Last data filed at 11/25/15 0600  Gross per 24 hour  Intake          3745.94 ml  Output             1975 ml  Net          1770.94 ml    Exam:  Examination:  General exam: Appears calm and comfortable  Respiratory system: Clear to auscultation. Respiratory effort normal. Cardiovascular system: S1 & S2 heard, RRR. No JVD, murmurs, rubs, gallops or clicks. No pedal edema. Gastrointestinal system: Abdomen is nondistended, soft and nontender. No organomegaly or masses felt. Normal bowel sounds heard. Central nervous system: Alert and oriented. No focal neurological  deficits. Extremities: Symmetric 5 x 5 power. Skin: No rashes, lesions or ulcers Psychiatry: Judgement and insight appear normal. Mood & affect appropriate.     Data Reviewed: I have personally reviewed following labs and imaging studies  Micro Results No results found for this or any previous visit (from the past 240 hour(s)).  Radiology Reports Dg Chest 1 View  Result Date: 11/05/2015 CLINICAL DATA:  Status post thoracentesis on the right EXAM: CHEST 1 VIEW COMPARISON:  November 04, 2015 FINDINGS: No pneumothorax evident. There has been interval resolution of pleural effusion on the right. A small left pleural effusion remains. There is bibasilar atelectatic change with patchy airspace consolidation in the left base. Heart is borderline enlarged with pulmonary vascularity within normal limits. Patient is status post coronary artery bypass grafting and aortic valve replacement. No bone lesions. No demonstrable adenopathy. There is an old healed fracture of the left clavicle. IMPRESSION: No pneumothorax following thoracentesis on the right. Interval resolution of right pleural effusion. Small persistent left pleural effusion. Persistent left lower lobe region consolidation. Stable cardiac silhouette. Electronically Signed   By: Lowella Grip III M.D.   On: 11/05/2015 15:23   Dg Chest 2 View  Result  Date: 11/14/2015 CLINICAL DATA:  Medical hx: cancer, diabetes, hypertension. Patient is lightheaded with limited mobility. EXAM: CHEST  2 VIEW COMPARISON:  11/06/2015 FINDINGS: The cardiac silhouette is mildly enlarged. Changes from cardiac surgery and valve replacement are stable. No mediastinal or hilar masses.  No convincing adenopathy. There is left greater than right lung base opacity mildly improved from the prior study. This may reflect pneumonia, atelectasis or a combination. Minimal left pleural effusion. No pneumothorax. Remainder of the lungs is clear. IMPRESSION: 1. Mild improvement in  left greater than right lung base opacity. Decreased left pleural fluid. Residual lung base opacity is likely atelectasis. Pneumonia is possible. 2. No pulmonary edema.  No new abnormalities. Electronically Signed   By: Lajean Manes M.D.   On: 11/14/2015 09:27   Dg Chest 2 View  Result Date: 11/06/2015 CLINICAL DATA:  Pt states he had 2 valves replacements and an aortic aneurysm repair last week. Pt has no other symptoms or complaints and no other heart problems. Pt has diabetes. Pleural effusion, right EXAM: CHEST  2 VIEW COMPARISON:  11/05/2015 FINDINGS: Changes from cardiac surgery and valve replacement are stable. Cardiac silhouette is normal in size. No mediastinal or hilar masses. There is left greater than right lung base opacity. On the left, this is likely due to a combination of a small pleural effusion with atelectasis. The right lung base opacity is likely atelectasis. Is no overt pulmonary edema. No pneumothorax. IMPRESSION: 1. Bibasilar lung opacity is similar to the most recent prior exam. This is consistent with a combination a left pleural effusion and bilateral lung base atelectasis. 2. No overt pulmonary edema.  No pneumothorax. Electronically Signed   By: Lajean Manes M.D.   On: 11/06/2015 13:24   Dg Chest 2 View  Result Date: 11/04/2015 CLINICAL DATA:  Hypoxia, dizziness EXAM: CHEST  2 VIEW COMPARISON:  11/02/2015 FINDINGS: Status post median sternotomy and cardiac valve replacement. Cardiomediastinal silhouette is stable. Stable title again noted some right perihilar scarring. Bilateral small pleural effusion with bilateral basilar atelectasis or infiltrate. No pulmonary edema. IMPRESSION: No pulmonary edema. Bilateral small pleural effusion with bilateral basilar atelectasis or infiltrate. Electronically Signed   By: Lahoma Crocker M.D.   On: 11/04/2015 12:51   Dg Chest 2 View  Result Date: 11/02/2015 CLINICAL DATA:  Cardiac surgery. EXAM: CHEST  2 VIEW COMPARISON:  Nineteen 2017.  FINDINGS: Prior CABG. Cardiac valve replacement. Stable cardiomegaly. Persistent bibasilar atelectasis and or infiltrate. Poor inspiration on lateral view. Bilateral pleural effusions cannot be excluded. IMPRESSION: 1. Prior CABG. Prior cardiac valve replacement. Stable cardiomegaly. No pulmonary venous congestion. 2. Stable bibasilar atelectasis and/or mild infiltrates. Poor inspiration on lateral view. Bilateral pleural effusions cannot be excluded. Electronically Signed   By: Marcello Moores  Register   On: 11/02/2015 07:36   Dg Chest 2 View  Result Date: 10/28/2015 CLINICAL DATA:  Preop chest exam for CABG. No current chest complaints. History of recent cardiac catheterization. EXAM: CHEST  2 VIEW COMPARISON:  04/03/2014 FINDINGS: The heart size and mediastinal contours are within normal limits. No acute pulmonary infiltrate, consolidation, or effusion. Cardiomediastinal silhouette stable. Small faint nodular opacities are seen in the left upper lobe and could relate to pulmonary vessels on end. No pneumothorax. Atherosclerotic calcifications in the aorta. Probable old left mid clavicular fracture. IMPRESSION: 1. No radiographic evidence for acute cardiopulmonary abnormality 2. Faint nodular opacities in the left upper lobe, possible pulmonary vessels on end versus small nodules. Suggest radiographic follow-up. Electronically Signed   By:  Donavan Foil M.D.   On: 10/28/2015 12:35   Dg Abd 1 View  Result Date: 11/14/2015 CLINICAL DATA:  Medical hx: cancer, diabetes, hypertension. Patient is lightheaded with limited mobility. EXAM: ABDOMEN - 1 VIEW COMPARISON:  None. FINDINGS: Normal bowel gas pattern. No evidence of renal or ureteral stones. Scattered aortic and iliac artery vascular calcifications. Soft tissues are otherwise unremarkable. No significant bony abnormality. IMPRESSION: No acute findings. Electronically Signed   By: Lajean Manes M.D.   On: 11/14/2015 09:28   Ct Head Wo Contrast  Result Date:  11/24/2015 CLINICAL DATA:  Headache and dizziness since bypass surgery on 10/29/2015. EXAM: CT HEAD WITHOUT CONTRAST TECHNIQUE: Contiguous axial images were obtained from the base of the skull through the vertex without intravenous contrast. COMPARISON:  11/13/2015 FINDINGS: Brain: The ventricles are normal in size and configuration. No extra-axial fluid collections are identified. The gray-white differentiation is normal. No CT findings for acute intracranial process such as hemorrhage or infarction. No mass lesions. The brainstem and cerebellum are grossly normal. Vascular: Minimal scattered vascular calcifications. No aneurysm or hyperdense vessels. Skull: No skull fracture or bone lesions. Sinuses/Orbits: The paranasal sinuses and mastoid air cells are clear. The globes are intact. Other: No scalp lesion or hematoma. IMPRESSION: Normal and stable head CT. Electronically Signed   By: Marijo Sanes M.D.   On: 11/24/2015 15:56   Ct Head Wo Contrast  Result Date: 11/13/2015 CLINICAL DATA:  Nausea, vomiting, and diarrhea for 2 weeks. Near syncope. EXAM: CT HEAD WITHOUT CONTRAST TECHNIQUE: Contiguous axial images were obtained from the base of the skull through the vertex without intravenous contrast. COMPARISON:  None. FINDINGS: Brain: No subdural, epidural, or subarachnoid hemorrhage. Ventricles and sulci are normal. Cerebellum, brainstem, and basal cisterns are normal. No acute cortical ischemia or infarct. Vascular: Calcified atherosclerosis is seen in the intracranial portions of the carotid arteries. Skull: Normal. Negative for fracture or focal lesion. Sinuses/Orbits: No acute finding. Other: No other abnormalities. IMPRESSION: No acute intracranial abnormalities. Electronically Signed   By: Dorise Bullion III M.D   On: 11/13/2015 11:56   Dg Chest Port 1 View  Result Date: 11/24/2015 CLINICAL DATA:  Fever and atrial fibrillation EXAM: PORTABLE CHEST 1 VIEW COMPARISON:  November 14, 2015 FINDINGS: There  is airspace consolidation in the left lower lobe consistent with pneumonia. There is a small associated left pleural effusion. The lungs elsewhere are clear. Heart is upper normal in size with pulmonary vascularity within normal limits. The patient is status post aortic valve replacement. The patient is also status post coronary artery bypass grafting. No adenopathy. There is evidence of old healed fracture of the left clavicle. IMPRESSION: Lobe airspace consolidation consistent with pneumonia. Small left pleural effusion. Lungs elsewhere clear. Stable cardiac silhouette. Followup PA and lateral chest radiographs recommended in 3-4 weeks following trial of antibiotic therapy to ensure resolution and exclude underlying malignancy. Electronically Signed   By: Lowella Grip III M.D.   On: 11/24/2015 14:00   Dg Chest Port 1 View  Result Date: 11/01/2015 CLINICAL DATA:  Status post aortic valve replacement EXAM: PORTABLE CHEST 1 VIEW COMPARISON:  10/31/2015 FINDINGS: Cardiac shadow is stable. Postoperative changes are again seen. Pericardial drain and mediastinal drain are seen. Left thoracostomy catheter has been removed in the interval. No pneumothorax is noted. Bibasilar atelectasis is seen. No bony abnormality is noted. IMPRESSION: No pneumothorax following chest tube removal. Electronically Signed   By: Inez Catalina M.D.   On: 11/01/2015 07:50   Dg  Chest Port 1 View  Result Date: 10/31/2015 CLINICAL DATA:  Hx CABG and AVR on 9/15 EXAM: PORTABLE CHEST 1 VIEW COMPARISON:  10/30/2015 FINDINGS: Stable cardiac silhouette. Retraction Swan-Ganz catheter. LEFT chest tubes remain. LEFT basilar atelectasis and small effusion. RIGHT lung clear. IMPRESSION: 1. No significant change. 2. LEFT basilar atelectasis and effusion. Electronically Signed   By: Suzy Bouchard M.D.   On: 10/31/2015 07:33   Dg Chest Port 1 View  Result Date: 10/30/2015 CLINICAL DATA:  Status post aortic valve replacement and CABG surgery.  EXAM: PORTABLE CHEST 1 VIEW COMPARISON:  10/29/2015 FINDINGS: Since the prior study, the endotracheal tube and the nasal/ orogastric tube have been removed. Right internal jugular Swan-Ganz catheter, left chest tubes and the mediastinal tube are stable. Cardiac silhouette is normal in size.  No mediastinal widening. Lung base opacity is stable consistent with atelectasis. No pulmonary edema. No pneumothorax. IMPRESSION: 1. No acute findings or evidence of an operative complication. 2. Stable lung base opacity consistent with atelectasis. 3. Remaining support apparatus is well positioned. Electronically Signed   By: Lajean Manes M.D.   On: 10/30/2015 09:04   Dg Chest Port 1 View  Result Date: 10/29/2015 CLINICAL DATA:  Post CABG. EXAM: PORTABLE CHEST 1 VIEW COMPARISON:  10/28/2015. FINDINGS: Cardiomegaly. Status post CABG. Status post AVR. Mediastinal widening status post aortic root replacement. Swan-Ganz catheter tip RIGHT ventricle, and should be advanced. Orogastric tube in the stomach. LEFT chest tube good position. Endotracheal tube 7 cm above carina. Mediastinal tube good position. No pneumothorax. Mild vascular congestion. IMPRESSION: Swan-Ganz catheter should be advanced into the RIGHT or LEFT pulmonary artery as it is too proximal. Postsurgical changes without other adverse features. These results will be called to the ordering clinician or representative by the Radiologist Assistant, and communication documented in the PACS or zVision Dashboard. Electronically Signed   By: Staci Righter M.D.   On: 10/29/2015 17:15   US Thoracentesis Asp Pleural Space W/img Guide  Result Date: 11/05/2015 INDICATION: Reason CABG. Right-sided pleural effusion. Request therapeutic thoracentesis. EXAM: ULTRASOUND GUIDED RIGHT THORACENTESIS MEDICATIONS: None. COMPLICATIONS: None immediate. PROCEDURE: An ultrasound guided thoracentesis was thoroughly discussed with the patient and questions answered. The benefits, risks,  alternatives and complications were also discussed. The patient understands and wishes to proceed with the procedure. Written consent was obtained. Ultrasound was performed to localize and mark an adequate pocket of fluid in the right chest. The area was then prepped and draped in the normal sterile fashion. 1% Lidocaine was used for local anesthesia. Under ultrasound guidance a Safe-T-Centesis catheter was introduced. Thoracentesis was performed. The catheter was removed and a dressing applied. FINDINGS: A total of approximately 700 mL of bloody pleural fluid was removed. IMPRESSION: Successful ultrasound guided right thoracentesis yielding 700 mL of pleural fluid. Read by: Ascencion Dike PA-C Electronically Signed   By: Corrie Mckusick D.O.   On: 11/05/2015 15:13     CBC  Recent Labs Lab 11/24/15 1233 11/25/15 0216  WBC 15.8* 11.5*  HGB 11.1* 10.1*  HCT 33.8* 31.2*  PLT 367 334  MCV 87.3 87.2  MCH 28.7 28.2  MCHC 32.8 32.4  RDW 14.9 14.7    Chemistries   Recent Labs Lab 11/24/15 1227 11/24/15 1233 11/25/15 0216  NA 135  --  136  K 5.1  --  4.1  CL 100*  --  102  CO2 25  --  26  GLUCOSE 144*  --  124*  BUN 11  --  8  CREATININE 1.55*  --  1.03  CALCIUM 9.1  --  8.6*  MG  --  1.8 1.7  AST 22  --   --   ALT 28  --   --   ALKPHOS 84  --   --   BILITOT 1.1  --   --    ------------------------------------------------------------------------------------------------------------------ estimated creatinine clearance is 75.3 mL/min (by C-G formula based on SCr of 1.03 mg/dL). ------------------------------------------------------------------------------------------------------------------ No results for input(s): HGBA1C in the last 72 hours. ------------------------------------------------------------------------------------------------------------------ No results for input(s): CHOL, HDL, LDLCALC, TRIG, CHOLHDL, LDLDIRECT in the last 72  hours. ------------------------------------------------------------------------------------------------------------------ No results for input(s): TSH, T4TOTAL, T3FREE, THYROIDAB in the last 72 hours.  Invalid input(s): FREET3 ------------------------------------------------------------------------------------------------------------------ No results for input(s): VITAMINB12, FOLATE, FERRITIN, TIBC, IRON, RETICCTPCT in the last 72 hours.  Coagulation profile  Recent Labs Lab 11/24/15 1233  INR 2.52    No results for input(s): DDIMER in the last 72 hours.  Cardiac Enzymes No results for input(s): CKMB, TROPONINI, MYOGLOBIN in the last 168 hours.  Invalid input(s): CK ------------------------------------------------------------------------------------------------------------------ Invalid input(s): POCBNP   CBG:  Recent Labs Lab 11/19/15 2100 11/20/15 0622 11/20/15 1135 11/24/15 1818 11/24/15 2153  GLUCAP 125* 153* 183* 128* 137*       Studies: Ct Head Wo Contrast  Result Date: 11/24/2015 CLINICAL DATA:  Headache and dizziness since bypass surgery on 10/29/2015. EXAM: CT HEAD WITHOUT CONTRAST TECHNIQUE: Contiguous axial images were obtained from the base of the skull through the vertex without intravenous contrast. COMPARISON:  11/13/2015 FINDINGS: Brain: The ventricles are normal in size and configuration. No extra-axial fluid collections are identified. The gray-white differentiation is normal. No CT findings for acute intracranial process such as hemorrhage or infarction. No mass lesions. The brainstem and cerebellum are grossly normal. Vascular: Minimal scattered vascular calcifications. No aneurysm or hyperdense vessels. Skull: No skull fracture or bone lesions. Sinuses/Orbits: The paranasal sinuses and mastoid air cells are clear. The globes are intact. Other: No scalp lesion or hematoma. IMPRESSION: Normal and stable head CT. Electronically Signed   By: Marijo Sanes  M.D.   On: 11/24/2015 15:56   Dg Chest Port 1 View  Result Date: 11/24/2015 CLINICAL DATA:  Fever and atrial fibrillation EXAM: PORTABLE CHEST 1 VIEW COMPARISON:  November 14, 2015 FINDINGS: There is airspace consolidation in the left lower lobe consistent with pneumonia. There is a small associated left pleural effusion. The lungs elsewhere are clear. Heart is upper normal in size with pulmonary vascularity within normal limits. The patient is status post aortic valve replacement. The patient is also status post coronary artery bypass grafting. No adenopathy. There is evidence of old healed fracture of the left clavicle. IMPRESSION: Lobe airspace consolidation consistent with pneumonia. Small left pleural effusion. Lungs elsewhere clear. Stable cardiac silhouette. Followup PA and lateral chest radiographs recommended in 3-4 weeks following trial of antibiotic therapy to ensure resolution and exclude underlying malignancy. Electronically Signed   By: Lowella Grip III M.D.   On: 11/24/2015 14:00      Lab Results  Component Value Date   HGBA1C 11.2 (H) 10/29/2015   HGBA1C 7.2 (H) 12/22/2014   HGBA1C 7.7 (H) 09/09/2014   Lab Results  Component Value Date   MICROALBUR 3.9 (H) 12/29/2013   LDLCALC 115 07/23/2015   CREATININE 1.03 11/25/2015       Scheduled Meds: . folic acid  1 mg Oral Daily  . Influenza vac split quadrivalent PF  0.5 mL Intramuscular Tomorrow-1000  . insulin aspart  0-5 Units  Subcutaneous QHS  . insulin aspart  0-9 Units Subcutaneous TID WC  . insulin glargine  10 Units Subcutaneous QHS  . midodrine  10 mg Oral TID WC  . pantoprazole  40 mg Oral Daily  . piperacillin-tazobactam  3.375 g Intravenous Q8H  . pneumococcal 23 valent vaccine  0.5 mL Intramuscular Tomorrow-1000  . rivaroxaban  20 mg Oral Q supper  . sodium chloride flush  3 mL Intravenous Q12H   Continuous Infusions: . sodium chloride 75 mL/hr at 11/24/15 2219  . amiodarone 30 mg/hr (11/25/15 0012)      LOS: 1 day    Time spent: >30 MINS    [place] [facility] Pager [phone]. If 7PM-7AM, please contact night-coverage at [email or web address], password [facility] 11/25/2015, 7:55 AM  LOS: 1 day

## 2015-11-25 NOTE — Care Management Note (Signed)
Case Management Note  Patient Details  Name: JUNE KOESTLER MRN: [id number] Date of Birth: 02/10/1946  Subjective/Objective:                 Patient from home with wife, Has DME RW and 3in1, Recent DC 11/15/15. 3 admissions in past 6 months. Active with Alvis Lemmings, notified [place] clinical liaison for [facility] of patients admission. He was active with RN and PT. Will need resumption order at DC. PCP Shawna Orleans, Cardiology [facility]   Action/Plan:   Expected Discharge Date:                  Expected Discharge Plan:  [facility]  In-House Referral:  NA  Discharge planning Services  CM Consult  Post Acute Care Choice:  Home Health, Resumption of Svcs/PTA Provider Choice offered to:  Patient  DME Arranged:  N/A DME Agency:  NA  HH Arranged:  PT HH Agency:  [facility]  Status of Service:  In process, will continue to follow  If discussed at Long Length of Stay Meetings, dates discussed:    Additional Comments:  Carles Collet, RN 11/25/2015, 10:47 AM

## 2015-11-25 NOTE — Progress Notes (Signed)
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  He has CAD with CABG as well.  He has had atrial fib.  This was treated with amiodarone and anticoagulation.  He was discharged on 10/7 after treatment for N/V and dehydration.  He had significant orthostatic hypotension treated with midodrine and TED hose.  Antihypertensives were held.  He came back to the hosp on 10/11 with N/V and atrial fib with rapid rate.   Subjective   Less nausea today.  Eating clears.  No pain.  Inpatient Medications    . folic acid  1 mg Oral Daily  . Influenza vac split quadrivalent PF  0.5 mL Intramuscular Tomorrow-1000  . insulin aspart  0-5 Units Subcutaneous QHS  . insulin aspart  0-9 Units Subcutaneous TID WC  . insulin glargine  10 Units Subcutaneous QHS  . midodrine  10 mg Oral TID WC  . pantoprazole  40 mg Oral Daily  . piperacillin-tazobactam  3.375 g Intravenous Q8H  . pneumococcal 23 valent vaccine  0.5 mL Intramuscular Tomorrow-1000  . rivaroxaban  20 mg Oral Q supper  . sodium chloride flush  3 mL Intravenous Q12H    Vital Signs    Vitals:   11/24/15 2200 11/24/15 2300 11/24/15 2328 11/25/15 0442  BP: 118/61 121/66 121/66 132/65  Pulse: 73 77 77 73  Resp: 17 16 10 12   Temp:   98.7 F (37.1 C) 98.4 F (36.9 C)  TempSrc:   Oral Oral  SpO2: 98% 97% 98% 98%  Weight:      Height:        Intake/Output Summary (Last 24 hours) at 11/25/15 0819 Last data filed at 11/25/15 0600  Gross per  24 hour  Intake          3745.94 ml  Output             1975 ml  Net          1770.94 ml   Filed Weights   11/24/15 1205 11/24/15 1820 11/24/15 1857  Weight: 179 lb (81.2 kg) 179 lb 14.3 oz (81.6 kg) 179 lb 14.3 oz (81.6 kg)    Physical Exam    GEN: Well nourished, well developed, in no acute distress.  Neck: Supple, no JVD, carotid bruits, or masses. Cardiac: RRR, no rubs, or gallops. No clubbing, cyanosis, no edema.  Radials/DP/PT 2+ and equal bilaterally.  Respiratory:  Respirations  regular and unlabored, clear to auscultation bilaterally. GI: Soft, nontender, nondistended, BS + x 4. Neuro:  Strength and sensation are intact.   Labs    CBC  Recent Labs  11/24/15 1233 11/25/15 0216  WBC 15.8* 11.5*  HGB 11.1* 10.1*  HCT 33.8* 31.2*  MCV 87.3 87.2  PLT 367 [id number]   Basic Metabolic Panel  Recent Labs  11/24/15 1227 11/24/15 1233 11/25/15 0216  NA 135  --  136  K 5.1  --  4.1  CL 100*  --  102  CO2 25  --  26  GLUCOSE 144*  --  124*  BUN 11  --  8  CREATININE 1.55*  --  1.03  CALCIUM 9.1  --  8.6*  MG  --  1.8 1.7   Liver Function Tests  Recent Labs  11/24/15 1227  AST 22  ALT 28  ALKPHOS 84  BILITOT 1.1  PROT 7.3  ALBUMIN 3.2*    Recent Labs  11/24/15 1227  LIPASE 29   Cardiac Enzymes No results for input(s): CKTOTAL, CKMB, CKMBINDEX, TROPONINI in the last 72 hours. BNP Invalid input(s): POCBNP D-Dimer No results for input(s): DDIMER in the last 72 hours. Hemoglobin A1C No results for input(s): HGBA1C in the last 72 hours. Fasting Lipid Panel No results for input(s): CHOL, HDL, LDLCALC, TRIG, CHOLHDL, LDLDIRECT in the last 72 hours. Thyroid Function Tests No results for input(s): TSH, T4TOTAL, T3FREE, THYROIDAB in the last 72 hours.  Invalid input(s): FREET3  Telemetry    NSR  ECG    NA  Radiology    Dg Chest 1 View  Result Date: 11/05/2015 CLINICAL DATA:  Status post thoracentesis on the right EXAM: CHEST 1 VIEW  COMPARISON:  November 04, 2015 FINDINGS: No pneumothorax evident. There has been interval resolution of pleural effusion on the right. A small left pleural effusion remains. There is bibasilar atelectatic change with patchy airspace consolidation in the left base. Heart is borderline enlarged with pulmonary vascularity within normal limits. Patient is status post coronary artery bypass grafting and aortic valve replacement. No bone lesions. No demonstrable adenopathy. There is an old healed fracture of the left clavicle. IMPRESSION: No pneumothorax following thoracentesis on the right. Interval resolution of right pleural effusion. Small persistent left pleural effusion. Persistent left lower lobe region consolidation. Stable cardiac silhouette. Electronically Signed   By: Lowella Grip III M.D.   On: 11/05/2015 15:23   Dg Chest 2 View  Result Date: 11/14/2015 CLINICAL DATA:  Medical hx: cancer, diabetes, hypertension. Patient is lightheaded with limited mobility. EXAM: CHEST  2 VIEW COMPARISON:  11/06/2015 FINDINGS: The cardiac silhouette is mildly enlarged. Changes from cardiac surgery and valve replacement are stable. No mediastinal or hilar masses.  No convincing adenopathy. There is left greater than right lung base opacity mildly improved from the prior study. This may reflect pneumonia, atelectasis or a combination. Minimal left pleural effusion. No pneumothorax. Remainder of the lungs is clear. IMPRESSION: 1. Mild improvement in left greater than right lung base opacity. Decreased left pleural fluid. Residual lung base opacity is likely atelectasis. Pneumonia is possible. 2. No pulmonary edema.  No new abnormalities. Electronically Signed   By: Lajean Manes M.D.   On: 11/14/2015 09:27   Dg Chest 2 View  Result Date: 11/06/2015 CLINICAL DATA:  Pt states he had 2 valves replacements and an aortic aneurysm repair last week. Pt has no other symptoms or complaints and no other heart problems. Pt has  diabetes. Pleural effusion, right EXAM: CHEST  2 VIEW COMPARISON:  11/05/2015 FINDINGS: Changes from cardiac surgery and valve replacement are stable. Cardiac silhouette is normal in size. No mediastinal or hilar masses. There is left greater than right lung base opacity. On the left, this is likely due to a combination of a small pleural effusion with atelectasis. The right lung base opacity is likely atelectasis. Is no overt pulmonary edema. No pneumothorax. IMPRESSION: 1. Bibasilar lung opacity is similar to the most recent prior exam. This is consistent with a combination a left pleural  effusion and bilateral lung base atelectasis. 2. No overt pulmonary edema.  No pneumothorax. Electronically Signed   By: Lajean Manes M.D.   On: 11/06/2015 13:24   Dg Chest 2 View  Result Date: 11/04/2015 CLINICAL DATA:  Hypoxia, dizziness EXAM: CHEST  2 VIEW COMPARISON:  11/02/2015 FINDINGS: Status post median sternotomy and cardiac valve replacement. Cardiomediastinal silhouette is stable. Stable title again noted some right perihilar scarring. Bilateral small pleural effusion with bilateral basilar atelectasis or infiltrate. No pulmonary edema. IMPRESSION: No pulmonary edema. Bilateral small pleural effusion with bilateral basilar atelectasis or infiltrate. Electronically Signed   By: Lahoma Crocker M.D.   On: 11/04/2015 12:51   Dg Chest 2 View  Result Date: 11/02/2015 CLINICAL DATA:  Cardiac surgery. EXAM: CHEST  2 VIEW COMPARISON:  Nineteen 2017. FINDINGS: Prior CABG. Cardiac valve replacement. Stable cardiomegaly. Persistent bibasilar atelectasis and or infiltrate. Poor inspiration on lateral view. Bilateral pleural effusions cannot be excluded. IMPRESSION: 1. Prior CABG. Prior cardiac valve replacement. Stable cardiomegaly. No pulmonary venous congestion. 2. Stable bibasilar atelectasis and/or mild infiltrates. Poor inspiration on lateral view. Bilateral pleural effusions cannot be excluded. Electronically Signed    By: Marcello Moores  Register   On: 11/02/2015 07:36   Dg Chest 2 View  Result Date: 10/28/2015 CLINICAL DATA:  Preop chest exam for CABG. No current chest complaints. History of recent cardiac catheterization. EXAM: CHEST  2 VIEW COMPARISON:  04/03/2014 FINDINGS: The heart size and mediastinal contours are within normal limits. No acute pulmonary infiltrate, consolidation, or effusion. Cardiomediastinal silhouette stable. Small faint nodular opacities are seen in the left upper lobe and could relate to pulmonary vessels on end. No pneumothorax. Atherosclerotic calcifications in the aorta. Probable old left mid clavicular fracture. IMPRESSION: 1. No radiographic evidence for acute cardiopulmonary abnormality 2. Faint nodular opacities in the left upper lobe, possible pulmonary vessels on end versus small nodules. Suggest radiographic follow-up. Electronically Signed   By: Donavan Foil M.D.   On: 10/28/2015 12:35   Dg Abd 1 View  Result Date: 11/14/2015 CLINICAL DATA:  Medical hx: cancer, diabetes, hypertension. Patient is lightheaded with limited mobility. EXAM: ABDOMEN - 1 VIEW COMPARISON:  None. FINDINGS: Normal bowel gas pattern. No evidence of renal or ureteral stones. Scattered aortic and iliac artery vascular calcifications. Soft tissues are otherwise unremarkable. No significant bony abnormality. IMPRESSION: No acute findings. Electronically Signed   By: Lajean Manes M.D.   On: 11/14/2015 09:28   Ct Head Wo Contrast  Result Date: 11/24/2015 CLINICAL DATA:  Headache and dizziness since bypass surgery on 10/29/2015. EXAM: CT HEAD WITHOUT CONTRAST TECHNIQUE: Contiguous axial images were obtained from the base of the skull through the vertex without intravenous contrast. COMPARISON:  11/13/2015 FINDINGS: Brain: The ventricles are normal in size and configuration. No extra-axial fluid collections are identified. The gray-white differentiation is normal. No CT findings for acute intracranial process such as  hemorrhage or infarction. No mass lesions. The brainstem and cerebellum are grossly normal. Vascular: Minimal scattered vascular calcifications. No aneurysm or hyperdense vessels. Skull: No skull fracture or bone lesions. Sinuses/Orbits: The paranasal sinuses and mastoid air cells are clear. The globes are intact. Other: No scalp lesion or hematoma. IMPRESSION: Normal and stable head CT. Electronically Signed   By: Marijo Sanes M.D.   On: 11/24/2015 15:56   Ct Head Wo Contrast  Result Date: 11/13/2015 CLINICAL DATA:  Nausea, vomiting, and diarrhea for 2 weeks. Near syncope. EXAM: CT HEAD WITHOUT CONTRAST TECHNIQUE: Contiguous axial images were obtained from the  base of the skull through the vertex without intravenous contrast. COMPARISON:  None. FINDINGS: Brain: No subdural, epidural, or subarachnoid hemorrhage. Ventricles and sulci are normal. Cerebellum, brainstem, and basal cisterns are normal. No acute cortical ischemia or infarct. Vascular: Calcified atherosclerosis is seen in the intracranial portions of the carotid arteries. Skull: Normal. Negative for fracture or focal lesion. Sinuses/Orbits: No acute finding. Other: No other abnormalities. IMPRESSION: No acute intracranial abnormalities. Electronically Signed   By: Dorise Bullion III M.D   On: 11/13/2015 11:56   Dg Chest Port 1 View  Result Date: 11/24/2015 CLINICAL DATA:  Fever and atrial fibrillation EXAM: PORTABLE CHEST 1 VIEW COMPARISON:  November 14, 2015 FINDINGS: There is airspace consolidation in the left lower lobe consistent with pneumonia. There is a small associated left pleural effusion. The lungs elsewhere are clear. Heart is upper normal in size with pulmonary vascularity within normal limits. The patient is status post aortic valve replacement. The patient is also status post coronary artery bypass grafting. No adenopathy. There is evidence of old healed fracture of the left clavicle. IMPRESSION: Lobe airspace consolidation  consistent with pneumonia. Small left pleural effusion. Lungs elsewhere clear. Stable cardiac silhouette. Followup PA and lateral chest radiographs recommended in 3-4 weeks following trial of antibiotic therapy to ensure resolution and exclude underlying malignancy. Electronically Signed   By: Lowella Grip III M.D.   On: 11/24/2015 14:00   Dg Chest Port 1 View  Result Date: 11/01/2015 CLINICAL DATA:  Status post aortic valve replacement EXAM: PORTABLE CHEST 1 VIEW COMPARISON:  10/31/2015 FINDINGS: Cardiac shadow is stable. Postoperative changes are again seen. Pericardial drain and mediastinal drain are seen. Left thoracostomy catheter has been removed in the interval. No pneumothorax is noted. Bibasilar atelectasis is seen. No bony abnormality is noted. IMPRESSION: No pneumothorax following chest tube removal. Electronically Signed   By: Inez Catalina M.D.   On: 11/01/2015 07:50   Dg Chest Port 1 View  Result Date: 10/31/2015 CLINICAL DATA:  Hx CABG and AVR on 9/15 EXAM: PORTABLE CHEST 1 VIEW COMPARISON:  10/30/2015 FINDINGS: Stable cardiac silhouette. Retraction Swan-Ganz catheter. LEFT chest tubes remain. LEFT basilar atelectasis and small effusion. RIGHT lung clear. IMPRESSION: 1. No significant change. 2. LEFT basilar atelectasis and effusion. Electronically Signed   By: Suzy Bouchard M.D.   On: 10/31/2015 07:33   Dg Chest Port 1 View  Result Date: 10/30/2015 CLINICAL DATA:  Status post aortic valve replacement and CABG surgery. EXAM: PORTABLE CHEST 1 VIEW COMPARISON:  10/29/2015 FINDINGS: Since the prior study, the endotracheal tube and the nasal/ orogastric tube have been removed. Right internal jugular Swan-Ganz catheter, left chest tubes and the mediastinal tube are stable. Cardiac silhouette is normal in size.  No mediastinal widening. Lung base opacity is stable consistent with atelectasis. No pulmonary edema. No pneumothorax. IMPRESSION: 1. No acute findings or evidence of an operative  complication. 2. Stable lung base opacity consistent with atelectasis. 3. Remaining support apparatus is well positioned. Electronically Signed   By: Lajean Manes M.D.   On: 10/30/2015 09:04   Dg Chest Port 1 View  Result Date: 10/29/2015 CLINICAL DATA:  Post CABG. EXAM: PORTABLE CHEST 1 VIEW COMPARISON:  10/28/2015. FINDINGS: Cardiomegaly. Status post CABG. Status post AVR. Mediastinal widening status post aortic root replacement. Swan-Ganz catheter tip RIGHT ventricle, and should be advanced. Orogastric tube in the stomach. LEFT chest tube good position. Endotracheal tube 7 cm above carina. Mediastinal tube good position. No pneumothorax. Mild vascular congestion. IMPRESSION: Swan-Ganz catheter  should be advanced into the RIGHT or LEFT pulmonary artery as it is too proximal. Postsurgical changes without other adverse features. These results will be called to the ordering clinician or representative by the Radiologist Assistant, and communication documented in the PACS or zVision Dashboard. Electronically Signed   By: Staci Righter M.D.   On: 10/29/2015 17:15   US Thoracentesis Asp Pleural Space W/img Guide  Result Date: 11/05/2015 INDICATION: Reason CABG. Right-sided pleural effusion. Request therapeutic thoracentesis. EXAM: ULTRASOUND GUIDED RIGHT THORACENTESIS MEDICATIONS: None. COMPLICATIONS: None immediate. PROCEDURE: An ultrasound guided thoracentesis was thoroughly discussed with the patient and questions answered. The benefits, risks, alternatives and complications were also discussed. The patient understands and wishes to proceed with the procedure. Written consent was obtained. Ultrasound was performed to localize and mark an adequate pocket of fluid in the right chest. The area was then prepped and draped in the normal sterile fashion. 1% Lidocaine was used for local anesthesia. Under ultrasound guidance a Safe-T-Centesis catheter was introduced. Thoracentesis was performed. The catheter was  removed and a dressing applied. FINDINGS: A total of approximately 700 mL of bloody pleural fluid was removed. IMPRESSION: Successful ultrasound guided right thoracentesis yielding 700 mL of pleural fluid. Read by: Ascencion Dike PA-C Electronically Signed   By: Corrie Mckusick D.O.   On: 11/05/2015 15:13    Assessment & Plan    ATRIAL FIB:  Maintaining NSR on IV amio.  Change to PO.    STATUS POST AVR/CABG:  No apparent post op complications.  UTI:   Per primary team.  ORTHOSTATIC HYPOTENSION:  Follow again today with repeat readings.  Continue compression stockings.  Continue midodrine.   Signed, Minus Breeding, MD  11/25/2015, 8:19 AM

## 2015-11-26 LAB — CBC
HCT: 31.5 % — ABNORMAL LOW (ref 39.0–52.0)
HEMOGLOBIN: 10.1 g/dL — AB (ref 13.0–17.0)
MCH: 28 pg (ref 26.0–34.0)
MCHC: 32.1 g/dL (ref 30.0–36.0)
MCV: 87.3 fL (ref 78.0–100.0)
PLATELETS: 326 10*3/uL (ref 150–400)
RBC: 3.61 MIL/uL — ABNORMAL LOW (ref 4.22–5.81)
RDW: 14.8 % (ref 11.5–15.5)
WBC: 9 10*3/uL (ref 4.0–10.5)

## 2015-11-26 LAB — COMPREHENSIVE METABOLIC PANEL
ALBUMIN: 2.9 g/dL — AB (ref 3.5–5.0)
ALT: 23 U/L (ref 17–63)
ANION GAP: 7 (ref 5–15)
AST: 24 U/L (ref 15–41)
Alkaline Phosphatase: 75 U/L (ref 38–126)
BUN: <5 mg/dL — ABNORMAL LOW (ref 6–20)
CALCIUM: 8.8 mg/dL — AB (ref 8.9–10.3)
CHLORIDE: 103 mmol/L (ref 101–111)
CO2: 28 mmol/L (ref 22–32)
Creatinine, Ser: 1.1 mg/dL (ref 0.61–1.24)
GFR calc Af Amer: >60 mL/min (ref >60)
GFR calc non Af Amer: >60 mL/min (ref >60)
GLUCOSE: 113 mg/dL — AB (ref 65–99)
POTASSIUM: 3.9 mmol/L (ref 3.5–5.1)
SODIUM: 138 mmol/L (ref 135–145)
Total Bilirubin: 0.6 mg/dL (ref 0.3–1.2)
Total Protein: 6.7 g/dL (ref 6.5–8.1)

## 2015-11-26 LAB — GLUCOSE, CAPILLARY
GLUCOSE-CAPILLARY: 132 mg/dL — AB (ref 65–99)
GLUCOSE-CAPILLARY: 147 mg/dL — AB (ref 65–99)
Glucose-Capillary: 109 mg/dL — ABNORMAL HIGH (ref 65–99)
Glucose-Capillary: 215 mg/dL — ABNORMAL HIGH (ref 65–99)

## 2015-11-26 LAB — C DIFFICILE QUICK SCREEN W PCR REFLEX
C Diff antigen: NEGATIVE
C Diff interpretation: NOT DETECTED
C Diff toxin: NEGATIVE

## 2015-11-26 LAB — PROCALCITONIN

## 2015-11-26 MED ORDER — LEVOFLOXACIN 750 MG PO TABS
750.0000 mg | ORAL_TABLET | Freq: Every day | ORAL | Status: DC
  Administered 2015-11-26 – 2015-11-27 (×2): 750 mg via ORAL
  Filled 2015-11-26 (×2): qty 1

## 2015-11-26 MED ORDER — SACCHAROMYCES BOULARDII 250 MG PO CAPS
250.0000 mg | ORAL_CAPSULE | Freq: Two times a day (BID) | ORAL | Status: DC
  Administered 2015-11-26 – 2015-11-27 (×3): 250 mg via ORAL
  Filled 2015-11-26 (×3): qty 1

## 2015-11-26 NOTE — Progress Notes (Signed)
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  He has CAD with CABG as well.  He has had atrial fib.  This was treated with amiodarone and anticoagulation.  He was discharged on 10/7 after treatment for N/V and dehydration.  He had significant orthostatic hypotension treated with midodrine and TED hose.  Antihypertensives were held.  He came back to the hosp on 10/11 with N/V and atrial fib with rapid rate.   Subjective   No nausea today.  Eating.  No pain.  Inpatient Medications    . amiodarone  200 mg Oral Daily  . folic acid  1 mg Oral Daily  . insulin aspart  0-5 Units Subcutaneous QHS  . insulin aspart  0-9 Units Subcutaneous TID WC  . insulin glargine  10 Units Subcutaneous QHS  . levofloxacin  750 mg Oral Daily  . midodrine  10 mg Oral TID WC  . pantoprazole  40 mg Oral Daily  . rivaroxaban  20 mg Oral Q supper  . saccharomyces boulardii  250 mg Oral BID  . sodium chloride flush  3 mL Intravenous Q12H    Vital Signs    Vitals:   11/25/15 1200 11/25/15 1504 11/25/15 2103 11/26/15 0520  BP: 124/64 128/69 120/60 (!) 113/57  Pulse: 67 68 65 73  Resp: 18 18 18 18   Temp: 98.5 F (36.9 C) 98.6 F (37 C) 98.3 F (36.8 C) 98.4 F (36.9 C)  TempSrc: Oral Oral Oral Oral  SpO2: 99% 97% 99% 97%  Weight:      Height:        Intake/Output Summary (Last 24 hours) at 11/26/15 1148 Last data filed at 11/26/15 0853  Gross per 24 hour  Intake             2653 ml  Output              2350 ml  Net              303 ml   Filed Weights   11/24/15 1205 11/24/15 1820 11/24/15 1857  Weight: 179 lb (81.2 kg) 179 lb 14.3 oz (81.6 kg) 179 lb 14.3 oz (81.6 kg)    Physical Exam    GEN: Well nourished, well developed, in no acute distress.  Neck: Supple, no JVD, carotid bruits, or masses. Cardiac: RRR, no rubs, or gallops. No clubbing, cyanosis, no edema.  Radials/DP/PT 2+ and equal bilaterally.  Respiratory:  Respirations  regular and unlabored, clear to auscultation bilaterally. GI: Soft, nontender, nondistended, BS + x 4. Neuro:  Strength and sensation are intact.   Labs    CBC  Recent Labs  11/25/15 0216 11/26/15 0609  WBC 11.5* 9.0  HGB 10.1* 10.1*  HCT 31.2* 31.5*  MCV 87.2 87.3  PLT 334 [id number]   Basic Metabolic Panel  Recent Labs  11/24/15 1233 11/25/15 0216 11/26/15 0609  NA  --  136 138  K  --  4.1 3.9  CL  --  102 103  CO2  --  26 28  GLUCOSE  --  124* 113*  BUN  --  8 <5*  CREATININE  --  1.03 1.10  CALCIUM  --  8.6* 8.8*  MG 1.8 1.7  --    Liver Function Tests  Recent Labs  11/24/15 1227 11/26/15 0609  AST 22 24  ALT 28 23  ALKPHOS 84 75  BILITOT 1.1 0.6  PROT 7.3 6.7  ALBUMIN 3.2* 2.9*    Recent Labs  11/24/15 1227  LIPASE 29   Cardiac Enzymes No results for input(s): CKTOTAL, CKMB, CKMBINDEX, TROPONINI in the last 72 hours. BNP Invalid input(s): POCBNP D-Dimer No results for input(s): DDIMER in the last 72 hours. Hemoglobin A1C No results for input(s): HGBA1C in the last 72 hours. Fasting Lipid Panel No results for input(s): CHOL, HDL, LDLCALC, TRIG, CHOLHDL, LDLDIRECT in the last 72 hours. Thyroid Function Tests No results for input(s): TSH, T4TOTAL, T3FREE, THYROIDAB in the last 72 hours.  Invalid input(s): FREET3  Telemetry    NSR.  One brief run of tachycardia not irregular.   ECG    NA  Radiology    Dg Chest 1 View  Result Date: 11/05/2015 CLINICAL DATA:  Status post thoracentesis on the  right EXAM: CHEST 1 VIEW COMPARISON:  November 04, 2015 FINDINGS: No pneumothorax evident. There has been interval resolution of pleural effusion on the right. A small left pleural effusion remains. There is bibasilar atelectatic change with patchy airspace consolidation in the left base. Heart is borderline enlarged with pulmonary vascularity within normal limits. Patient is status post coronary artery bypass grafting and aortic valve replacement. No bone lesions. No demonstrable adenopathy. There is an old healed fracture of the left clavicle. IMPRESSION: No pneumothorax following thoracentesis on the right. Interval resolution of right pleural effusion. Small persistent left pleural effusion. Persistent left lower lobe region consolidation. Stable cardiac silhouette. Electronically Signed   By: Lowella Grip III M.D.   On: 11/05/2015 15:23   Dg Chest 2 View  Result Date: 11/14/2015 CLINICAL DATA:  Medical hx: cancer, diabetes, hypertension. Patient is lightheaded with limited mobility. EXAM: CHEST  2 VIEW COMPARISON:  11/06/2015 FINDINGS: The cardiac silhouette is mildly enlarged. Changes from cardiac surgery and valve replacement are stable. No mediastinal or hilar masses.  No convincing adenopathy. There is left greater than right lung base opacity mildly improved from the prior study. This may reflect pneumonia, atelectasis or a combination. Minimal left pleural effusion. No pneumothorax. Remainder of the lungs is clear. IMPRESSION: 1. Mild improvement in left greater than right lung base opacity. Decreased left pleural fluid. Residual lung base opacity is likely atelectasis. Pneumonia is possible. 2. No pulmonary edema.  No new abnormalities. Electronically Signed   By: Lajean Manes M.D.   On: 11/14/2015 09:27   Dg Chest 2 View  Result Date: 11/06/2015 CLINICAL DATA:  Pt states he had 2 valves replacements and an aortic aneurysm repair last week. Pt has no other symptoms or complaints and no  other heart problems. Pt has diabetes. Pleural effusion, right EXAM: CHEST  2 VIEW COMPARISON:  11/05/2015 FINDINGS: Changes from cardiac surgery and valve replacement are stable. Cardiac silhouette is normal in size. No mediastinal or hilar masses. There is left greater than right lung base opacity. On the left, this is likely due to a combination of a small pleural effusion with atelectasis. The right lung base opacity is likely atelectasis. Is no overt pulmonary edema.  No pneumothorax. IMPRESSION: 1. Bibasilar lung opacity is similar to the most recent prior exam. This is consistent with a combination a left pleural effusion and bilateral lung base atelectasis. 2. No overt pulmonary edema.  No pneumothorax. Electronically Signed   By: Lajean Manes M.D.   On: 11/06/2015 13:24   Dg Chest 2 View  Result Date: 11/04/2015 CLINICAL DATA:  Hypoxia, dizziness EXAM: CHEST  2 VIEW COMPARISON:  11/02/2015 FINDINGS: Status post median sternotomy and cardiac valve replacement. Cardiomediastinal silhouette is stable. Stable title again noted some right perihilar scarring. Bilateral small pleural effusion with bilateral basilar atelectasis or infiltrate. No pulmonary edema. IMPRESSION: No pulmonary edema. Bilateral small pleural effusion with bilateral basilar atelectasis or infiltrate. Electronically Signed   By: Lahoma Crocker M.D.   On: 11/04/2015 12:51   Dg Chest 2 View  Result Date: 11/02/2015 CLINICAL DATA:  Cardiac surgery. EXAM: CHEST  2 VIEW COMPARISON:  Nineteen 2017. FINDINGS: Prior CABG. Cardiac valve replacement. Stable cardiomegaly. Persistent bibasilar atelectasis and or infiltrate. Poor inspiration on lateral view. Bilateral pleural effusions cannot be excluded. IMPRESSION: 1. Prior CABG. Prior cardiac valve replacement. Stable cardiomegaly. No pulmonary venous congestion. 2. Stable bibasilar atelectasis and/or mild infiltrates. Poor inspiration on lateral view. Bilateral pleural effusions cannot be  excluded. Electronically Signed   By: Marcello Moores  Register   On: 11/02/2015 07:36   Dg Chest 2 View  Result Date: 10/28/2015 CLINICAL DATA:  Preop chest exam for CABG. No current chest complaints. History of recent cardiac catheterization. EXAM: CHEST  2 VIEW COMPARISON:  04/03/2014 FINDINGS: The heart size and mediastinal contours are within normal limits. No acute pulmonary infiltrate, consolidation, or effusion. Cardiomediastinal silhouette stable. Small faint nodular opacities are seen in the left upper lobe and could relate to pulmonary vessels on end. No pneumothorax. Atherosclerotic calcifications in the aorta. Probable old left mid clavicular fracture. IMPRESSION: 1. No radiographic evidence for acute cardiopulmonary abnormality 2. Faint nodular opacities in the left upper lobe, possible pulmonary vessels on end versus small nodules. Suggest radiographic follow-up. Electronically Signed   By: Donavan Foil M.D.   On: 10/28/2015 12:35   Dg Abd 1 View  Result Date: 11/14/2015 CLINICAL DATA:  Medical hx: cancer, diabetes, hypertension. Patient is lightheaded with limited mobility. EXAM: ABDOMEN - 1 VIEW COMPARISON:  None. FINDINGS: Normal bowel gas pattern. No evidence of renal or ureteral stones. Scattered aortic and iliac artery vascular calcifications. Soft tissues are otherwise unremarkable. No significant bony abnormality. IMPRESSION: No acute findings. Electronically Signed   By: Lajean Manes M.D.   On: 11/14/2015 09:28   Ct Head Wo Contrast  Result Date: 11/24/2015 CLINICAL DATA:  Headache and dizziness since bypass surgery on 10/29/2015. EXAM: CT HEAD WITHOUT CONTRAST TECHNIQUE: Contiguous axial images were obtained from the base of the skull through the vertex without intravenous contrast. COMPARISON:  11/13/2015 FINDINGS: Brain: The ventricles are normal in size and configuration. No extra-axial fluid collections are identified. The gray-white differentiation is normal. No CT findings for  acute intracranial process such as hemorrhage or infarction. No mass lesions. The brainstem and cerebellum are grossly normal. Vascular: Minimal scattered vascular calcifications. No aneurysm or hyperdense vessels. Skull: No skull fracture or bone lesions. Sinuses/Orbits: The paranasal sinuses and mastoid air cells are clear. The globes are intact. Other: No scalp lesion or hematoma. IMPRESSION: Normal and stable head CT. Electronically Signed   By: Marijo Sanes M.D.   On: 11/24/2015 15:56   Ct Head Wo Contrast  Result Date: 11/13/2015 CLINICAL  DATA:  Nausea, vomiting, and diarrhea for 2 weeks. Near syncope. EXAM: CT HEAD WITHOUT CONTRAST TECHNIQUE: Contiguous axial images were obtained from the base of the skull through the vertex without intravenous contrast. COMPARISON:  None. FINDINGS: Brain: No subdural, epidural, or subarachnoid hemorrhage. Ventricles and sulci are normal. Cerebellum, brainstem, and basal cisterns are normal. No acute cortical ischemia or infarct. Vascular: Calcified atherosclerosis is seen in the intracranial portions of the carotid arteries. Skull: Normal. Negative for fracture or focal lesion. Sinuses/Orbits: No acute finding. Other: No other abnormalities. IMPRESSION: No acute intracranial abnormalities. Electronically Signed   By: Dorise Bullion III M.D   On: 11/13/2015 11:56   Dg Chest Port 1 View  Result Date: 11/24/2015 CLINICAL DATA:  Fever and atrial fibrillation EXAM: PORTABLE CHEST 1 VIEW COMPARISON:  November 14, 2015 FINDINGS: There is airspace consolidation in the left lower lobe consistent with pneumonia. There is a small associated left pleural effusion. The lungs elsewhere are clear. Heart is upper normal in size with pulmonary vascularity within normal limits. The patient is status post aortic valve replacement. The patient is also status post coronary artery bypass grafting. No adenopathy. There is evidence of old healed fracture of the left clavicle. IMPRESSION:  Lobe airspace consolidation consistent with pneumonia. Small left pleural effusion. Lungs elsewhere clear. Stable cardiac silhouette. Followup PA and lateral chest radiographs recommended in 3-4 weeks following trial of antibiotic therapy to ensure resolution and exclude underlying malignancy. Electronically Signed   By: Lowella Grip III M.D.   On: 11/24/2015 14:00   Dg Chest Port 1 View  Result Date: 11/01/2015 CLINICAL DATA:  Status post aortic valve replacement EXAM: PORTABLE CHEST 1 VIEW COMPARISON:  10/31/2015 FINDINGS: Cardiac shadow is stable. Postoperative changes are again seen. Pericardial drain and mediastinal drain are seen. Left thoracostomy catheter has been removed in the interval. No pneumothorax is noted. Bibasilar atelectasis is seen. No bony abnormality is noted. IMPRESSION: No pneumothorax following chest tube removal. Electronically Signed   By: Inez Catalina M.D.   On: 11/01/2015 07:50   Dg Chest Port 1 View  Result Date: 10/31/2015 CLINICAL DATA:  Hx CABG and AVR on 9/15 EXAM: PORTABLE CHEST 1 VIEW COMPARISON:  10/30/2015 FINDINGS: Stable cardiac silhouette. Retraction Swan-Ganz catheter. LEFT chest tubes remain. LEFT basilar atelectasis and small effusion. RIGHT lung clear. IMPRESSION: 1. No significant change. 2. LEFT basilar atelectasis and effusion. Electronically Signed   By: Suzy Bouchard M.D.   On: 10/31/2015 07:33   Dg Chest Port 1 View  Result Date: 10/30/2015 CLINICAL DATA:  Status post aortic valve replacement and CABG surgery. EXAM: PORTABLE CHEST 1 VIEW COMPARISON:  10/29/2015 FINDINGS: Since the prior study, the endotracheal tube and the nasal/ orogastric tube have been removed. Right internal jugular Swan-Ganz catheter, left chest tubes and the mediastinal tube are stable. Cardiac silhouette is normal in size.  No mediastinal widening. Lung base opacity is stable consistent with atelectasis. No pulmonary edema. No pneumothorax. IMPRESSION: 1. No acute findings  or evidence of an operative complication. 2. Stable lung base opacity consistent with atelectasis. 3. Remaining support apparatus is well positioned. Electronically Signed   By: Lajean Manes M.D.   On: 10/30/2015 09:04   Dg Chest Port 1 View  Result Date: 10/29/2015 CLINICAL DATA:  Post CABG. EXAM: PORTABLE CHEST 1 VIEW COMPARISON:  10/28/2015. FINDINGS: Cardiomegaly. Status post CABG. Status post AVR. Mediastinal widening status post aortic root replacement. Swan-Ganz catheter tip RIGHT ventricle, and should be advanced. Orogastric tube in the  stomach. LEFT chest tube good position. Endotracheal tube 7 cm above carina. Mediastinal tube good position. No pneumothorax. Mild vascular congestion. IMPRESSION: Swan-Ganz catheter should be advanced into the RIGHT or LEFT pulmonary artery as it is too proximal. Postsurgical changes without other adverse features. These results will be called to the ordering clinician or representative by the Radiologist Assistant, and communication documented in the PACS or zVision Dashboard. Electronically Signed   By: Staci Righter M.D.   On: 10/29/2015 17:15   US Thoracentesis Asp Pleural Space W/img Guide  Result Date: 11/05/2015 INDICATION: Reason CABG. Right-sided pleural effusion. Request therapeutic thoracentesis. EXAM: ULTRASOUND GUIDED RIGHT THORACENTESIS MEDICATIONS: None. COMPLICATIONS: None immediate. PROCEDURE: An ultrasound guided thoracentesis was thoroughly discussed with the patient and questions answered. The benefits, risks, alternatives and complications were also discussed. The patient understands and wishes to proceed with the procedure. Written consent was obtained. Ultrasound was performed to localize and mark an adequate pocket of fluid in the right chest. The area was then prepped and draped in the normal sterile fashion. 1% Lidocaine was used for local anesthesia. Under ultrasound guidance a Safe-T-Centesis catheter was introduced. Thoracentesis was  performed. The catheter was removed and a dressing applied. FINDINGS: A total of approximately 700 mL of bloody pleural fluid was removed. IMPRESSION: Successful ultrasound guided right thoracentesis yielding 700 mL of pleural fluid. Read by: Ascencion Dike PA-C Electronically Signed   By: Corrie Mckusick D.O.   On: 11/05/2015 15:13    Assessment & Plan    ATRIAL FIB:    Changed to PO amio yesterday.      STATUS POST AVR/CABG:  No apparent post op complications.  UTI:   Per primary team.  ORTHOSTATIC HYPOTENSION:   Need to have orthostatic vitals.   Also needs to have his BP recorded with ambulation.    Signed, Minus Breeding, MD  11/26/2015, 11:48 AM

## 2015-11-26 NOTE — Progress Notes (Signed)
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 Postoperative course was complicated by PAF which was treated with amiodarone and Xarelto. The patient was also recently discharged on 11/20/2015 after having nine-day hospital stay for which she was treated for orthostatic hypotension, nausea and vomiting, and UTI. The patient was discharged home with amiodarone and rivaroxaban. In addition, Midodrine was started on 11/18/2015 and titrated up to 10 mg 3 times a day. In addition, the patient's ARB, diuretics, and beta blockers were discontinued due to his orthostasis. The patient presented again with 2-3 day history of nausea and vomiting. Patient found to have leukocytosis, HCAP, atrial flutter. Cardiology was consulted.    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 his multiple episodes of medical issues in the past 1-2 months -Continue antiemetics Resume diabetic diet -His diabetes mellitus is poorly controlled with hemoglobin A1c 11.2 on 10/29/2015 which has also contributed to his functional gastroparesis -add PPI -add reglan if no improvement  Acute kidney  injury, baseline creatinine 0.92, presented with a creatinine of 1.55 -Secondary to volume depletion Resolved   Pulmonary Infiltrate -Not completely convinced the patient has pneumonia as he is not hypoxemic nor short of breath -Certainly, with the patient's recurrent vomiting he may have a component of aspiration pneumonitis -Procalcitonin 0.10 Discontinue Zosyn and switch to levofloxacin for 5 days   Diarrhea C. difficile negative, recent GI pathogen panel was negative Start patient on a probiotic   Near syncope  history of pericardial AVR last month. He has CAD with CABG as well. He has had atrial fib Presented with atrial fibrillation and started on amiodarone drip, converted to normal sinus rhythm - Patient has had several days' history of nausea, vomiting and diarrhea,He came back to the hosp on 10/11 with N/V and atrial fib with rapid rate.  He had significant orthostatic hypotension treated with midodrine and TED hose. Antihypertensives were held.  Likely related to long standing poorly controlled diabetes.  - He has been repeatedly counseled regarding measures to minimize her symptoms from orthostatic hypotension: Gradual transition from supine to sitting to standing and ambulating, adequate hydration, increasing salt intake, elevating legs up when he is sitting, where TED hose when ambulating. He verbalizes understanding.  Uncontrolled DM 2 - Hemoglobin A1c 10/29/15: 11.2. Metformin held due to current acute illness. Placed on reduced dose Lantus and SSIIn the hospital. -Reasonable inpatient control. - Resume prior home medications at discharge.  Essential hypertension - Controlled. Orthostatic changes. Started midodrine 10/5 and titrated up to 10 MG 3 times a day today. Change lower extremity TED hoses to thigh high.  Paroxysmal A. 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  - LIMA to LAD, SVG to first and second OM, SVG to RCA.     DVT prophylaxsis Xarelto  Code Status:  Full code      Family Communication: Discussed in detail with the patient, all imaging results, lab results explained to the patient   Disposition Plan:  Continue telemetry, PT OT consultation, anticipate discharge in one to 2 days      Consultants:  Cardiology    Procedures:  None  Antibiotics: Anti-infectives    Start     Dose/Rate Route Frequency Ordered Stop   11/25/15 0600  vancomycin (VANCOCIN) IVPB 750 mg/150 ml premix  Status:  Discontinued     750 mg 150 mL/hr over 60 Minutes Intravenous Every 12 hours 11/24/15 1551 11/24/15 1842   11/25/15 0100  ceFEPIme (MAXIPIME) 1 g in dextrose 5 % 50 mL IVPB  Status:  Discontinued     1 g 100 mL/hr over 30 Minutes Intravenous Every 8 hours 11/24/15 1621 11/24/15 1842   11/24/15 2359  piperacillin-tazobactam (ZOSYN) IVPB 3.375 g     3.375 g 12.5 mL/hr over 240 Minutes Intravenous Every 8 hours 11/24/15 1817        HPI/Subjective: Had 1 loose bowel movement today, none yesterday  Objective: Vitals:   11/25/15 1200 11/25/15 1504 11/25/15 2103 11/26/15 0520  BP: 124/64 128/69 120/60 (!) 113/57  Pulse: 67 68 65 73  Resp: 18 18 18 18   Temp: 98.5 F (36.9 C) 98.6 F (37 C) 98.3 F (36.8 C) 98.4 F (36.9 C)  TempSrc: Oral Oral Oral Oral  SpO2: 99% 97% 99% 97%  Weight:      Height:        Intake/Output Summary (Last 24 hours) at 11/26/15 0819 Last data filed at 11/26/15 0521  Gross per 24 hour  Intake             2878 ml  Output             2100 ml  Net              778 ml    Exam:  Examination:  General exam: Appears calm and comfortable  Respiratory system: Clear  to auscultation. Respiratory effort normal. Cardiovascular system: S1 & S2 heard, RRR. No JVD, murmurs, rubs, gallops or clicks. No pedal edema. Gastrointestinal system: Abdomen is nondistended, soft and nontender. No organomegaly or masses felt. Normal bowel sounds heard. Central nervous system: Alert and oriented. No focal neurological deficits. Extremities: Symmetric 5 x 5 power. Skin: No rashes, lesions or ulcers Psychiatry: Judgement and insight appear normal. Mood & affect appropriate.     Data Reviewed: I have personally reviewed following labs and imaging studies  Micro Results Recent Results (from the past 240 hour(s))  Culture, blood (Routine X 2) w Reflex to ID Panel     Status: None (Preliminary result)   Collection Time: 11/24/15  4:30 PM  Result Value Ref Range Status   Specimen Description BLOOD RIGHT ANTECUBITAL  Final   Special Requests BOTTLES DRAWN AEROBIC AND ANAEROBIC 5CC  Final   Culture NO GROWTH < 24 HOURS  Final   Report Status PENDING  Incomplete  Culture, blood (Routine X 2) w Reflex to ID Panel     Status: None (Preliminary result)   Collection Time: 11/24/15  4:58 PM  Result Value Ref Range Status   Specimen Description BLOOD LEFT ARM  Final   Special Requests BOTTLES DRAWN AEROBIC AND ANAEROBIC 5CC  Final   Culture NO GROWTH < 24 HOURS  Final   Report Status PENDING  Incomplete  Clostridium Difficile by PCR     Status: None   Collection Time: 11/25/15  7:41 PM  Result Value Ref Range Status   Toxigenic C Difficile by pcr NEGATIVE NEGATIVE Final    Comment: Patient is colonized with non toxigenic C. difficile. May not need treatment unless significant symptoms are present.    Radiology Reports Dg Chest 1 View  Result Date: 11/05/2015 CLINICAL DATA:  Status post thoracentesis on the right EXAM: CHEST 1 VIEW COMPARISON:  November 04, 2015 FINDINGS: No pneumothorax evident. There has been interval resolution of pleural effusion on the right. A small  left pleural effusion remains. There is bibasilar atelectatic change with patchy airspace consolidation in the left base. Heart is borderline enlarged with pulmonary vascularity within normal limits. Patient is status post coronary artery bypass grafting and aortic valve replacement. No bone lesions. No demonstrable adenopathy. There is an old healed fracture of the left clavicle. IMPRESSION: No pneumothorax following thoracentesis on the right. Interval resolution of right pleural effusion. Small persistent left pleural effusion. Persistent left lower lobe region consolidation. Stable cardiac silhouette. Electronically Signed   By: Lowella Grip III M.D.   On: 11/05/2015 15:23   Dg Chest 2 View  Result Date: 11/14/2015 CLINICAL DATA:  Medical hx: cancer, diabetes, hypertension. Patient is lightheaded with limited mobility. EXAM: CHEST  2 VIEW COMPARISON:  11/06/2015 FINDINGS: The cardiac silhouette is mildly enlarged. Changes from cardiac surgery and valve replacement are stable. No mediastinal or hilar masses.  No convincing adenopathy. There is left greater than right lung base opacity mildly improved from the prior study. This may reflect pneumonia, atelectasis or a combination. Minimal left pleural effusion. No pneumothorax. Remainder of the lungs is clear. IMPRESSION: 1. Mild improvement in left greater than right lung base opacity. Decreased left pleural fluid. Residual lung base opacity is likely atelectasis. Pneumonia is possible. 2. No pulmonary edema.  No new abnormalities. Electronically Signed   By: Lajean Manes M.D.   On: 11/14/2015 09:27   Dg Chest 2 View  Result Date: 11/06/2015 CLINICAL DATA:  Pt states he had 2 valves replacements and an aortic aneurysm repair last week. Pt has no other symptoms or complaints and no other heart problems. Pt has diabetes. Pleural effusion, right EXAM: CHEST  2 VIEW COMPARISON:  11/05/2015 FINDINGS: Changes from cardiac surgery and valve replacement are  stable. Cardiac silhouette is normal in size. No mediastinal or hilar masses. There is left greater than right lung base opacity. On the left, this is likely due to a combination of a small pleural effusion with atelectasis. The right lung base opacity is likely atelectasis. Is no overt pulmonary edema. No pneumothorax. IMPRESSION: 1. Bibasilar lung opacity is similar to the most recent prior exam. This is consistent with a combination a left pleural effusion and bilateral lung base atelectasis. 2. No overt pulmonary edema.  No pneumothorax. Electronically Signed   By: Lajean Manes M.D.   On: 11/06/2015 13:24   Dg Chest 2 View  Result Date: 11/04/2015 CLINICAL DATA:  Hypoxia, dizziness EXAM: CHEST  2 VIEW COMPARISON:  11/02/2015 FINDINGS: Status post median sternotomy and cardiac valve replacement. Cardiomediastinal silhouette is stable. Stable title again noted some right perihilar scarring. Bilateral  small pleural effusion with bilateral basilar atelectasis or infiltrate. No pulmonary edema. IMPRESSION: No pulmonary edema. Bilateral small pleural effusion with bilateral basilar atelectasis or infiltrate. Electronically Signed   By: Lahoma Crocker M.D.   On: 11/04/2015 12:51   Dg Chest 2 View  Result Date: 11/02/2015 CLINICAL DATA:  Cardiac surgery. EXAM: CHEST  2 VIEW COMPARISON:  Nineteen 2017. FINDINGS: Prior CABG. Cardiac valve replacement. Stable cardiomegaly. Persistent bibasilar atelectasis and or infiltrate. Poor inspiration on lateral view. Bilateral pleural effusions cannot be excluded. IMPRESSION: 1. Prior CABG. Prior cardiac valve replacement. Stable cardiomegaly. No pulmonary venous congestion. 2. Stable bibasilar atelectasis and/or mild infiltrates. Poor inspiration on lateral view. Bilateral pleural effusions cannot be excluded. Electronically Signed   By: Marcello Moores  Register   On: 11/02/2015 07:36   Dg Chest 2 View  Result Date: 10/28/2015 CLINICAL DATA:  Preop chest exam for CABG. No current  chest complaints. History of recent cardiac catheterization. EXAM: CHEST  2 VIEW COMPARISON:  04/03/2014 FINDINGS: The heart size and mediastinal contours are within normal limits. No acute pulmonary infiltrate, consolidation, or effusion. Cardiomediastinal silhouette stable. Small faint nodular opacities are seen in the left upper lobe and could relate to pulmonary vessels on end. No pneumothorax. Atherosclerotic calcifications in the aorta. Probable old left mid clavicular fracture. IMPRESSION: 1. No radiographic evidence for acute cardiopulmonary abnormality 2. Faint nodular opacities in the left upper lobe, possible pulmonary vessels on end versus small nodules. Suggest radiographic follow-up. Electronically Signed   By: Donavan Foil M.D.   On: 10/28/2015 12:35   Dg Abd 1 View  Result Date: 11/14/2015 CLINICAL DATA:  Medical hx: cancer, diabetes, hypertension. Patient is lightheaded with limited mobility. EXAM: ABDOMEN - 1 VIEW COMPARISON:  None. FINDINGS: Normal bowel gas pattern. No evidence of renal or ureteral stones. Scattered aortic and iliac artery vascular calcifications. Soft tissues are otherwise unremarkable. No significant bony abnormality. IMPRESSION: No acute findings. Electronically Signed   By: Lajean Manes M.D.   On: 11/14/2015 09:28   Ct Head Wo Contrast  Result Date: 11/24/2015 CLINICAL DATA:  Headache and dizziness since bypass surgery on 10/29/2015. EXAM: CT HEAD WITHOUT CONTRAST TECHNIQUE: Contiguous axial images were obtained from the base of the skull through the vertex without intravenous contrast. COMPARISON:  11/13/2015 FINDINGS: Brain: The ventricles are normal in size and configuration. No extra-axial fluid collections are identified. The gray-white differentiation is normal. No CT findings for acute intracranial process such as hemorrhage or infarction. No mass lesions. The brainstem and cerebellum are grossly normal. Vascular: Minimal scattered vascular calcifications.  No aneurysm or hyperdense vessels. Skull: No skull fracture or bone lesions. Sinuses/Orbits: The paranasal sinuses and mastoid air cells are clear. The globes are intact. Other: No scalp lesion or hematoma. IMPRESSION: Normal and stable head CT. Electronically Signed   By: Marijo Sanes M.D.   On: 11/24/2015 15:56   Ct Head Wo Contrast  Result Date: 11/13/2015 CLINICAL DATA:  Nausea, vomiting, and diarrhea for 2 weeks. Near syncope. EXAM: CT HEAD WITHOUT CONTRAST TECHNIQUE: Contiguous axial images were obtained from the base of the skull through the vertex without intravenous contrast. COMPARISON:  None. FINDINGS: Brain: No subdural, epidural, or subarachnoid hemorrhage. Ventricles and sulci are normal. Cerebellum, brainstem, and basal cisterns are normal. No acute cortical ischemia or infarct. Vascular: Calcified atherosclerosis is seen in the intracranial portions of the carotid arteries. Skull: Normal. Negative for fracture or focal lesion. Sinuses/Orbits: No acute finding. Other: No other abnormalities. IMPRESSION: No acute intracranial abnormalities.  Electronically Signed   By: Dorise Bullion III M.D   On: 11/13/2015 11:56   Dg Chest Port 1 View  Result Date: 11/24/2015 CLINICAL DATA:  Fever and atrial fibrillation EXAM: PORTABLE CHEST 1 VIEW COMPARISON:  November 14, 2015 FINDINGS: There is airspace consolidation in the left lower lobe consistent with pneumonia. There is a small associated left pleural effusion. The lungs elsewhere are clear. Heart is upper normal in size with pulmonary vascularity within normal limits. The patient is status post aortic valve replacement. The patient is also status post coronary artery bypass grafting. No adenopathy. There is evidence of old healed fracture of the left clavicle. IMPRESSION: Lobe airspace consolidation consistent with pneumonia. Small left pleural effusion. Lungs elsewhere clear. Stable cardiac silhouette. Followup PA and lateral chest radiographs  recommended in 3-4 weeks following trial of antibiotic therapy to ensure resolution and exclude underlying malignancy. Electronically Signed   By: Lowella Grip III M.D.   On: 11/24/2015 14:00   Dg Chest Port 1 View  Result Date: 11/01/2015 CLINICAL DATA:  Status post aortic valve replacement EXAM: PORTABLE CHEST 1 VIEW COMPARISON:  10/31/2015 FINDINGS: Cardiac shadow is stable. Postoperative changes are again seen. Pericardial drain and mediastinal drain are seen. Left thoracostomy catheter has been removed in the interval. No pneumothorax is noted. Bibasilar atelectasis is seen. No bony abnormality is noted. IMPRESSION: No pneumothorax following chest tube removal. Electronically Signed   By: Inez Catalina M.D.   On: 11/01/2015 07:50   Dg Chest Port 1 View  Result Date: 10/31/2015 CLINICAL DATA:  Hx CABG and AVR on 9/15 EXAM: PORTABLE CHEST 1 VIEW COMPARISON:  10/30/2015 FINDINGS: Stable cardiac silhouette. Retraction Swan-Ganz catheter. LEFT chest tubes remain. LEFT basilar atelectasis and small effusion. RIGHT lung clear. IMPRESSION: 1. No significant change. 2. LEFT basilar atelectasis and effusion. Electronically Signed   By: Suzy Bouchard M.D.   On: 10/31/2015 07:33   Dg Chest Port 1 View  Result Date: 10/30/2015 CLINICAL DATA:  Status post aortic valve replacement and CABG surgery. EXAM: PORTABLE CHEST 1 VIEW COMPARISON:  10/29/2015 FINDINGS: Since the prior study, the endotracheal tube and the nasal/ orogastric tube have been removed. Right internal jugular Swan-Ganz catheter, left chest tubes and the mediastinal tube are stable. Cardiac silhouette is normal in size.  No mediastinal widening. Lung base opacity is stable consistent with atelectasis. No pulmonary edema. No pneumothorax. IMPRESSION: 1. No acute findings or evidence of an operative complication. 2. Stable lung base opacity consistent with atelectasis. 3. Remaining support apparatus is well positioned. Electronically Signed    By: Lajean Manes M.D.   On: 10/30/2015 09:04   Dg Chest Port 1 View  Result Date: 10/29/2015 CLINICAL DATA:  Post CABG. EXAM: PORTABLE CHEST 1 VIEW COMPARISON:  10/28/2015. FINDINGS: Cardiomegaly. Status post CABG. Status post AVR. Mediastinal widening status post aortic root replacement. Swan-Ganz catheter tip RIGHT ventricle, and should be advanced. Orogastric tube in the stomach. LEFT chest tube good position. Endotracheal tube 7 cm above carina. Mediastinal tube good position. No pneumothorax. Mild vascular congestion. IMPRESSION: Swan-Ganz catheter should be advanced into the RIGHT or LEFT pulmonary artery as it is too proximal. Postsurgical changes without other adverse features. These results will be called to the ordering clinician or representative by the Radiologist Assistant, and communication documented in the PACS or zVision Dashboard. Electronically Signed   By: Staci Righter M.D.   On: 10/29/2015 17:15   US Thoracentesis Asp Pleural Space W/img Guide  Result Date: 11/05/2015 INDICATION:  Reason CABG. Right-sided pleural effusion. Request therapeutic thoracentesis. EXAM: ULTRASOUND GUIDED RIGHT THORACENTESIS MEDICATIONS: None. COMPLICATIONS: None immediate. PROCEDURE: An ultrasound guided thoracentesis was thoroughly discussed with the patient and questions answered. The benefits, risks, alternatives and complications were also discussed. The patient understands and wishes to proceed with the procedure. Written consent was obtained. Ultrasound was performed to localize and mark an adequate pocket of fluid in the right chest. The area was then prepped and draped in the normal sterile fashion. 1% Lidocaine was used for local anesthesia. Under ultrasound guidance a Safe-T-Centesis catheter was introduced. Thoracentesis was performed. The catheter was removed and a dressing applied. FINDINGS: A total of approximately 700 mL of bloody pleural fluid was removed. IMPRESSION: Successful ultrasound  guided right thoracentesis yielding 700 mL of pleural fluid. Read by: Ascencion Dike PA-C Electronically Signed   By: Corrie Mckusick D.O.   On: 11/05/2015 15:13     CBC  Recent Labs Lab 11/24/15 1233 11/25/15 0216 11/26/15 0609  WBC 15.8* 11.5* 9.0  HGB 11.1* 10.1* 10.1*  HCT 33.8* 31.2* 31.5*  PLT 367 334 326  MCV 87.3 87.2 87.3  MCH 28.7 28.2 28.0  MCHC 32.8 32.4 32.1  RDW 14.9 14.7 14.8    Chemistries   Recent Labs Lab 11/24/15 1227 11/24/15 1233 11/25/15 0216 11/26/15 0609  NA 135  --  136 138  K 5.1  --  4.1 3.9  CL 100*  --  102 103  CO2 25  --  26 28  GLUCOSE 144*  --  124* 113*  BUN 11  --  8 <5*  CREATININE 1.55*  --  1.03 1.10  CALCIUM 9.1  --  8.6* 8.8*  MG  --  1.8 1.7  --   AST 22  --   --  24  ALT 28  --   --  23  ALKPHOS 84  --   --  75  BILITOT 1.1  --   --  0.6   ------------------------------------------------------------------------------------------------------------------ estimated creatinine clearance is 70.5 mL/min (by C-G formula based on SCr of 1.1 mg/dL). ------------------------------------------------------------------------------------------------------------------ No results for input(s): HGBA1C in the last 72 hours. ------------------------------------------------------------------------------------------------------------------ No results for input(s): CHOL, HDL, LDLCALC, TRIG, CHOLHDL, LDLDIRECT in the last 72 hours. ------------------------------------------------------------------------------------------------------------------ No results for input(s): TSH, T4TOTAL, T3FREE, THYROIDAB in the last 72 hours.  Invalid input(s): FREET3 ------------------------------------------------------------------------------------------------------------------ No results for input(s): VITAMINB12, FOLATE, FERRITIN, TIBC, IRON, RETICCTPCT in the last 72 hours.  Coagulation profile  Recent Labs Lab 11/24/15 1233  INR 2.52    No results for  input(s): DDIMER in the last 72 hours.  Cardiac Enzymes No results for input(s): CKMB, TROPONINI, MYOGLOBIN in the last 168 hours.  Invalid input(s): CK ------------------------------------------------------------------------------------------------------------------ Invalid input(s): POCBNP   CBG:  Recent Labs Lab 11/25/15 0858 11/25/15 1241 11/25/15 1731 11/25/15 2010 11/26/15 0718  GLUCAP 126* 122* 141* 168* 109*       Studies: Ct Head Wo Contrast  Result Date: 11/24/2015 CLINICAL DATA:  Headache and dizziness since bypass surgery on 10/29/2015. EXAM: CT HEAD WITHOUT CONTRAST TECHNIQUE: Contiguous axial images were obtained from the base of the skull through the vertex without intravenous contrast. COMPARISON:  11/13/2015 FINDINGS: Brain: The ventricles are normal in size and configuration. No extra-axial fluid collections are identified. The gray-white differentiation is normal. No CT findings for acute intracranial process such as hemorrhage or infarction. No mass lesions. The brainstem and cerebellum are grossly normal. Vascular: Minimal scattered vascular calcifications. No aneurysm or hyperdense vessels. Skull: No skull  fracture or bone lesions. Sinuses/Orbits: The paranasal sinuses and mastoid air cells are clear. The globes are intact. Other: No scalp lesion or hematoma. IMPRESSION: Normal and stable head CT. Electronically Signed   By: Marijo Sanes M.D.   On: 11/24/2015 15:56   Dg Chest Port 1 View  Result Date: 11/24/2015 CLINICAL DATA:  Fever and atrial fibrillation EXAM: PORTABLE CHEST 1 VIEW COMPARISON:  November 14, 2015 FINDINGS: There is airspace consolidation in the left lower lobe consistent with pneumonia. There is a small associated left pleural effusion. The lungs elsewhere are clear. Heart is upper normal in size with pulmonary vascularity within normal limits. The patient is status post aortic valve replacement. The patient is also status post coronary artery  bypass grafting. No adenopathy. There is evidence of old healed fracture of the left clavicle. IMPRESSION: Lobe airspace consolidation consistent with pneumonia. Small left pleural effusion. Lungs elsewhere clear. Stable cardiac silhouette. Followup PA and lateral chest radiographs recommended in 3-4 weeks following trial of antibiotic therapy to ensure resolution and exclude underlying malignancy. Electronically Signed   By: Lowella Grip III M.D.   On: 11/24/2015 14:00      Lab Results  Component Value Date   HGBA1C 11.2 (H) 10/29/2015   HGBA1C 7.2 (H) 12/22/2014   HGBA1C 7.7 (H) 09/09/2014   Lab Results  Component Value Date   MICROALBUR 3.9 (H) 12/29/2013   LDLCALC 115 07/23/2015   CREATININE 1.10 11/26/2015       Scheduled Meds: . amiodarone  200 mg Oral Daily  . folic acid  1 mg Oral Daily  . insulin aspart  0-5 Units Subcutaneous QHS  . insulin aspart  0-9 Units Subcutaneous TID WC  . insulin glargine  10 Units Subcutaneous QHS  . levofloxacin  750 mg Oral Daily  . midodrine  10 mg Oral TID WC  . pantoprazole  40 mg Oral Daily  . rivaroxaban  20 mg Oral Q supper  . sodium chloride flush  3 mL Intravenous Q12H   Continuous Infusions: . sodium chloride 75 mL/hr at 11/25/15 0800     LOS: 2 days    Time spent: >30 MINS    [facility]  [facility] Pager [phone]. If 7PM-7AM, please contact night-coverage at [email or web address], password [facility] 11/26/2015, 8:19 AM  LOS: 2 days

## 2015-11-27 DIAGNOSIS — I2510 Atherosclerotic heart disease of native coronary artery without angina pectoris: Secondary | ICD-10-CM

## 2015-11-27 DIAGNOSIS — I480 Paroxysmal atrial fibrillation: Principal | ICD-10-CM

## 2015-11-27 DIAGNOSIS — Z951 Presence of aortocoronary bypass graft: Secondary | ICD-10-CM

## 2015-11-27 LAB — URINE CULTURE

## 2015-11-27 LAB — GLUCOSE, CAPILLARY
GLUCOSE-CAPILLARY: 122 mg/dL — AB (ref 65–99)
Glucose-Capillary: 178 mg/dL — ABNORMAL HIGH (ref 65–99)

## 2015-11-27 MED ORDER — SACCHAROMYCES BOULARDII 250 MG PO CAPS: 250.0000 mg | ORAL_CAPSULE | Freq: Two times a day (BID) | ORAL | 0 refills | Status: DC

## 2015-11-27 MED ORDER — LEVOFLOXACIN 750 MG PO TABS: 750.0000 mg | ORAL_TABLET | Freq: Every day | ORAL | 0 refills | Status: DC

## 2015-11-27 MED ORDER — INSULIN GLARGINE 100 UNIT/ML SOLOSTAR PEN: 20.0000 [IU] | PEN_INJECTOR | Freq: Every day | SUBCUTANEOUS | 5 refills | Status: DC

## 2015-11-27 NOTE — Progress Notes (Signed)
Pt discharged to home.  Family at bedside.  Pt and family has no questions at the time of discharge.  IV removed.  Pt states he has all belongings. Pt taken off unit via wheelchair by NT.

## 2015-11-27 NOTE — Progress Notes (Addendum)
   SUBJECTIVE: The patient is doing well today.  At this time, he denies chest pain, shortness of breath, or any new concerns.  [name] Kitchen amiodarone  200 mg Oral Daily  . folic acid  1 mg Oral Daily  . insulin aspart  0-5 Units Subcutaneous QHS  . insulin aspart  0-9 Units Subcutaneous TID WC  . insulin glargine  10 Units Subcutaneous QHS  . levofloxacin  750 mg Oral Daily  . midodrine  10 mg Oral TID WC  . pantoprazole  40 mg Oral Daily  . rivaroxaban  20 mg Oral Q supper  . saccharomyces boulardii  250 mg Oral BID  . sodium chloride flush  3 mL Intravenous Q12H   . sodium chloride 75 mL/hr at 11/27/15 0859    OBJECTIVE: Physical Exam: Vitals:   11/26/15 1436 11/26/15 2037 11/27/15 0620 11/27/15 0853  BP: (!) 145/75 109/77 109/75 123/66  Pulse: 76 71 75 81  Resp: 18 16 16    Temp: 98.4 F (36.9 C) 98.8 F (37.1 C) 98.2 F (36.8 C)   TempSrc: Oral Oral Oral   SpO2: 97% 99% 99%   Weight:      Height:        Intake/Output Summary (Last 24 hours) at 11/27/15 1119 Last data filed at 11/27/15 0904  Gross per 24 hour  Intake             1613 ml  Output             2375 ml  Net             -762 ml    Telemetry reveals sinus rhythm  GEN- The patient is well appearing, alert and oriented x 3 today.   Head- normocephalic, atraumatic Eyes-  Sclera clear, conjunctiva pink Ears- hearing intact Oropharynx- clear Neck- supple,  Lungs- Clear to ausculation bilaterally, normal work of breathing Heart- Regular rate and rhythm  GI- soft, NT, ND, + BS Extremities- no clubbing, cyanosis, or edema Skin- no rash or lesion Psych- euthymic mood, full affect Neuro- strength and sensation are intact  LABS: Basic Metabolic Panel:  Recent Labs  11/24/15 1233 11/25/15 0216 11/26/15 0609  NA  --  136 138  K  --  4.1 3.9  CL  --  102 103  CO2  --  26 28  GLUCOSE  --  124* 113*  BUN  --  8 <5*  CREATININE  --  1.03 1.10  CALCIUM  --  8.6* 8.8*  MG 1.8 1.7  --    Liver Function  Tests:  Recent Labs  11/24/15 1227 11/26/15 0609  AST 22 24  ALT 28 23  ALKPHOS 84 75  BILITOT 1.1 0.6  PROT 7.3 6.7  ALBUMIN 3.2* 2.9*    Recent Labs  11/24/15 1227  LIPASE 29   CBC:  Recent Labs  11/25/15 0216 11/26/15 0609  WBC 11.5* 9.0  HGB 10.1* 10.1*  HCT 31.2* 31.5*  MCV 87.2 87.3  PLT 334 326    ASSESSMENT AND PLAN:   1. Atrial fibrillation Maintaining sinus rhythm with oral amiodarone. On xarelto for stroke prevention  2. S/p AVR/CABG Stable No change required today  3. orthostasis Encourage PO hydration  If ambulatory without symptoms, could go home today from my standpoint. No further inpatient cardiology workup.  Cardiology to see as needed while here.  I will arrange follow-up in the [facility] clinic in 3-4 days.   Thompson Grayer, MD 11/27/2015 11:19 AM

## 2015-11-27 NOTE — Discharge Summary (Addendum)
Physician Discharge Summary  [name] MRN: [id number] DOB/AGE: 1945-09-01 70 y.o.  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 Qty: 5 tablet, Refills: 0    saccharomyces boulardii (FLORASTOR) 250 MG capsule Take 1 capsule (250 mg total) by mouth 2 (two) times daily. Qty: 20 capsule, Refills: 0      CONTINUE these medications which have CHANGED   Details  Insulin Glargine (LANTUS SOLOSTAR) 100 UNIT/ML Solostar Pen Inject 20 Units into the skin daily at 10 pm. Qty: 5 pen, Refills: 5   Associated Diagnoses: Type II diabetes mellitus with neurological manifestations, uncontrolled ([place])      CONTINUE these medications which have NOT CHANGED   Details  amiodarone (PACERONE) 200 MG tablet Take 1 tablet (200 mg total) by mouth daily. Qty: 60 tablet, Refills: 1    atorvastatin (LIPITOR) 40 MG tablet Take 1 tablet (40 mg total) by mouth daily. Qty: 30 tablet, Refills: 11    famotidine (PEPCID AC) 10 MG chewable tablet Chew 1 tablet (10 mg total) by mouth 2 (two) times daily as needed for heartburn.    ferrous gluconate (FERGON) 324 MG tablet Take 1 tablet (324 mg  total) by mouth 2 (two) times daily with a meal. Qty: 60 tablet, Refills: 1    folic acid (FOLVITE) 1 MG tablet Take 1 tablet (1 mg total) by mouth daily. Qty: 30 tablet, Refills: 1    glucose blood test strip Use as instructed Qty: 100 each, Refills: 12   Associated Diagnoses: Uncontrolled type 2 diabetes mellitus with diabetic neuropathy, with long-term current use of insulin (HCC)    insulin lispro (HUMALOG KWIKPEN) 100 UNIT/ML KiwkPen Use 5 to 10 units three times daily 10-15 minutes before meals Qty: 15 mL, Refills: 5    metFORMIN (GLUCOPHAGE) 1000 MG tablet Take 1 tablet (1,000 mg total) by mouth 2 (two) times daily with a meal. Qty: 180 tablet, Refills: 1    midodrine (PROAMATINE) 10 MG tablet Take 1 tablet (10 mg total) by mouth 3 (three) times daily with meals. Qty: 90 tablet, Refills: 0    ondansetron (ZOFRAN ODT) 4 MG disintegrating tablet Take 1 tablet (4 mg total) by mouth every 8 (eight) hours as needed for nausea or vomiting. Qty: 20 tablet, Refills: 0   Associated Diagnoses: Post-operative nausea and vomiting    oxyCODONE (OXY IR/ROXICODONE) 5 MG immediate release tablet Take 1-2 tablets (5-10 mg total) by mouth every 6 (six) hours as needed for severe pain. Qty: 30 tablet, Refills: 0    rivaroxaban (XARELTO) 20 MG TABS tablet Take 1 tablet (20 mg total) by mouth daily with supper. Qty: 30 tablet, Refills: 0  Discharge Condition: *stable  Discharge Instructions Get Medicines reviewed and adjusted: Please take all your medications with you for your next visit with your Primary MD  Please request your Primary MD to go over all hospital tests and procedure/radiological results at the follow up, please ask your Primary MD to get all Hospital records sent to his/her office.  If you experience worsening of your admission symptoms, develop shortness of breath, life threatening emergency, suicidal or homicidal thoughts you must seek medical attention  immediately by calling 911 or calling your MD immediately if symptoms less severe.  You must read complete instructions/literature along with all the possible adverse reactions/side effects for all the Medicines you take and that have been prescribed to you. Take any new Medicines after you have completely understood and accpet all the possible adverse reactions/side effects.   Do not drive when taking Pain medications.   Do not take more than prescribed Pain, Sleep and Anxiety Medications  Special Instructions: If you have smoked or chewed Tobacco in the last 2 yrs please stop smoking, stop any regular Alcohol and or any Recreational drug use.  Wear Seat belts while driving.  Please note  You were cared for by a hospitalist during your hospital stay. Once you are discharged, your primary care physician will handle any further medical issues. Please note that NO REFILLS for any discharge medications will be authorized once you are discharged, as it is imperative that you return to your primary care physician (or establish a relationship with a primary care physician if you do not have one) for your aftercare needs so that they can reassess your need for medications and monitor your lab values.  Discharge Instructions    Amb referral to AFIB Clinic    Complete by:  As directed    Diet - low sodium heart healthy    Complete by:  As directed    Increase activity slowly    Complete by:  As directed        Allergies  Allergen Reactions  . No Known Allergies       Disposition: 06-Home-Health Care Svc   Consults: *cards    Significant Diagnostic Studies:  Dg Chest 1 View  Result Date: 11/05/2015 CLINICAL DATA:  Status post thoracentesis on the right EXAM: CHEST 1 VIEW COMPARISON:  November 04, 2015 FINDINGS: No pneumothorax evident. There has been interval resolution of pleural effusion on the right. A small left pleural effusion remains. There is bibasilar atelectatic change  with patchy airspace consolidation in the left base. Heart is borderline enlarged with pulmonary vascularity within normal limits. Patient is status post coronary artery bypass grafting and aortic valve replacement. No bone lesions. No demonstrable adenopathy. There is an old healed fracture of the left clavicle. IMPRESSION: No pneumothorax following thoracentesis on the right. Interval resolution of right pleural effusion. Small persistent left pleural effusion. Persistent left lower lobe region consolidation. Stable cardiac silhouette. Electronically Signed   By: Lowella Grip III M.D.   On: 11/05/2015 15:23   Dg Chest 2 View  Result Date: 11/14/2015 CLINICAL DATA:  Medical hx: cancer, diabetes, hypertension. Patient is lightheaded with limited mobility. EXAM: CHEST  2 VIEW COMPARISON:  11/06/2015 FINDINGS: The cardiac silhouette is mildly enlarged. Changes from cardiac surgery and valve replacement are stable. No mediastinal or hilar masses.  No convincing adenopathy. There is left greater than right lung base opacity mildly improved from the prior study. This may reflect pneumonia, atelectasis or a combination. Minimal left pleural  effusion. No pneumothorax. Remainder of the lungs is clear. IMPRESSION: 1. Mild improvement in left greater than right lung base opacity. Decreased left pleural fluid. Residual lung base opacity is likely atelectasis. Pneumonia is possible. 2. No pulmonary edema.  No new abnormalities. Electronically Signed   By: Lajean Manes M.D.   On: 11/14/2015 09:27   Dg Chest 2 View  Result Date: 11/06/2015 CLINICAL DATA:  Pt states he had 2 valves replacements and an aortic aneurysm repair last week. Pt has no other symptoms or complaints and no other heart problems. Pt has diabetes. Pleural effusion, right EXAM: CHEST  2 VIEW COMPARISON:  11/05/2015 FINDINGS: Changes from cardiac surgery and valve replacement are stable. Cardiac silhouette is normal in size. No mediastinal or hilar  masses. There is left greater than right lung base opacity. On the left, this is likely due to a combination of a small pleural effusion with atelectasis. The right lung base opacity is likely atelectasis. Is no overt pulmonary edema. No pneumothorax. IMPRESSION: 1. Bibasilar lung opacity is similar to the most recent prior exam. This is consistent with a combination a left pleural effusion and bilateral lung base atelectasis. 2. No overt pulmonary edema.  No pneumothorax. Electronically Signed   By: Lajean Manes M.D.   On: 11/06/2015 13:24   Dg Chest 2 View  Result Date: 11/04/2015 CLINICAL DATA:  Hypoxia, dizziness EXAM: CHEST  2 VIEW COMPARISON:  11/02/2015 FINDINGS: Status post median sternotomy and cardiac valve replacement. Cardiomediastinal silhouette is stable. Stable title again noted some right perihilar scarring. Bilateral small pleural effusion with bilateral basilar atelectasis or infiltrate. No pulmonary edema. IMPRESSION: No pulmonary edema. Bilateral small pleural effusion with bilateral basilar atelectasis or infiltrate. Electronically Signed   By: Lahoma Crocker M.D.   On: 11/04/2015 12:51   Dg Chest 2 View  Result Date: 11/02/2015 CLINICAL DATA:  Cardiac surgery. EXAM: CHEST  2 VIEW COMPARISON:  Nineteen 2017. FINDINGS: Prior CABG. Cardiac valve replacement. Stable cardiomegaly. Persistent bibasilar atelectasis and or infiltrate. Poor inspiration on lateral view. Bilateral pleural effusions cannot be excluded. IMPRESSION: 1. Prior CABG. Prior cardiac valve replacement. Stable cardiomegaly. No pulmonary venous congestion. 2. Stable bibasilar atelectasis and/or mild infiltrates. Poor inspiration on lateral view. Bilateral pleural effusions cannot be excluded. Electronically Signed   By: Marcello Moores  Register   On: 11/02/2015 07:36   Dg Chest 2 View  Result Date: 10/28/2015 CLINICAL DATA:  Preop chest exam for CABG. No current chest complaints. History of recent cardiac catheterization. EXAM:  CHEST  2 VIEW COMPARISON:  04/03/2014 FINDINGS: The heart size and mediastinal contours are within normal limits. No acute pulmonary infiltrate, consolidation, or effusion. Cardiomediastinal silhouette stable. Small faint nodular opacities are seen in the left upper lobe and could relate to pulmonary vessels on end. No pneumothorax. Atherosclerotic calcifications in the aorta. Probable old left mid clavicular fracture. IMPRESSION: 1. No radiographic evidence for acute cardiopulmonary abnormality 2. Faint nodular opacities in the left upper lobe, possible pulmonary vessels on end versus small nodules. Suggest radiographic follow-up. Electronically Signed   By: Donavan Foil M.D.   On: 10/28/2015 12:35   Dg Abd 1 View  Result Date: 11/14/2015 CLINICAL DATA:  Medical hx: cancer, diabetes, hypertension. Patient is lightheaded with limited mobility. EXAM: ABDOMEN - 1 VIEW COMPARISON:  None. FINDINGS: Normal bowel gas pattern. No evidence of renal or ureteral stones. Scattered aortic and iliac artery vascular calcifications. Soft tissues are otherwise unremarkable. No significant bony abnormality. IMPRESSION: No acute findings. Electronically Signed  By: Lajean Manes M.D.   On: 11/14/2015 09:28   Ct Head Wo Contrast  Result Date: 11/24/2015 CLINICAL DATA:  Headache and dizziness since bypass surgery on 10/29/2015. EXAM: CT HEAD WITHOUT CONTRAST TECHNIQUE: Contiguous axial images were obtained from the base of the skull through the vertex without intravenous contrast. COMPARISON:  11/13/2015 FINDINGS: Brain: The ventricles are normal in size and configuration. No extra-axial fluid collections are identified. The gray-white differentiation is normal. No CT findings for acute intracranial process such as hemorrhage or infarction. No mass lesions. The brainstem and cerebellum are grossly normal. Vascular: Minimal scattered vascular calcifications. No aneurysm or hyperdense vessels. Skull: No skull fracture or bone  lesions. Sinuses/Orbits: The paranasal sinuses and mastoid air cells are clear. The globes are intact. Other: No scalp lesion or hematoma. IMPRESSION: Normal and stable head CT. Electronically Signed   By: Marijo Sanes M.D.   On: 11/24/2015 15:56   Ct Head Wo Contrast  Result Date: 11/13/2015 CLINICAL DATA:  Nausea, vomiting, and diarrhea for 2 weeks. Near syncope. EXAM: CT HEAD WITHOUT CONTRAST TECHNIQUE: Contiguous axial images were obtained from the base of the skull through the vertex without intravenous contrast. COMPARISON:  None. FINDINGS: Brain: No subdural, epidural, or subarachnoid hemorrhage. Ventricles and sulci are normal. Cerebellum, brainstem, and basal cisterns are normal. No acute cortical ischemia or infarct. Vascular: Calcified atherosclerosis is seen in the intracranial portions of the carotid arteries. Skull: Normal. Negative for fracture or focal lesion. Sinuses/Orbits: No acute finding. Other: No other abnormalities. IMPRESSION: No acute intracranial abnormalities. Electronically Signed   By: Dorise Bullion III M.D   On: 11/13/2015 11:56   Dg Chest Port 1 View  Result Date: 11/24/2015 CLINICAL DATA:  Fever and atrial fibrillation EXAM: PORTABLE CHEST 1 VIEW COMPARISON:  November 14, 2015 FINDINGS: There is airspace consolidation in the left lower lobe consistent with pneumonia. There is a small associated left pleural effusion. The lungs elsewhere are clear. Heart is upper normal in size with pulmonary vascularity within normal limits. The patient is status post aortic valve replacement. The patient is also status post coronary artery bypass grafting. No adenopathy. There is evidence of old healed fracture of the left clavicle. IMPRESSION: Lobe airspace consolidation consistent with pneumonia. Small left pleural effusion. Lungs elsewhere clear. Stable cardiac silhouette. Followup PA and lateral chest radiographs recommended in 3-4 weeks following trial of antibiotic therapy to ensure  resolution and exclude underlying malignancy. Electronically Signed   By: Lowella Grip III M.D.   On: 11/24/2015 14:00   Dg Chest Port 1 View  Result Date: 11/01/2015 CLINICAL DATA:  Status post aortic valve replacement EXAM: PORTABLE CHEST 1 VIEW COMPARISON:  10/31/2015 FINDINGS: Cardiac shadow is stable. Postoperative changes are again seen. Pericardial drain and mediastinal drain are seen. Left thoracostomy catheter has been removed in the interval. No pneumothorax is noted. Bibasilar atelectasis is seen. No bony abnormality is noted. IMPRESSION: No pneumothorax following chest tube removal. Electronically Signed   By: Inez Catalina M.D.   On: 11/01/2015 07:50   Dg Chest Port 1 View  Result Date: 10/31/2015 CLINICAL DATA:  Hx CABG and AVR on 9/15 EXAM: PORTABLE CHEST 1 VIEW COMPARISON:  10/30/2015 FINDINGS: Stable cardiac silhouette. Retraction Swan-Ganz catheter. LEFT chest tubes remain. LEFT basilar atelectasis and small effusion. RIGHT lung clear. IMPRESSION: 1. No significant change. 2. LEFT basilar atelectasis and effusion. Electronically Signed   By: Suzy Bouchard M.D.   On: 10/31/2015 07:33   Dg Chest [facility]  Result Date: 10/30/2015 CLINICAL DATA:  Status post aortic valve replacement and CABG surgery. EXAM: PORTABLE CHEST 1 VIEW COMPARISON:  10/29/2015 FINDINGS: Since the prior study, the endotracheal tube and the nasal/ orogastric tube have been removed. Right internal jugular Swan-Ganz catheter, left chest tubes and the mediastinal tube are stable. Cardiac silhouette is normal in size.  No mediastinal widening. Lung base opacity is stable consistent with atelectasis. No pulmonary edema. No pneumothorax. IMPRESSION: 1. No acute findings or evidence of an operative complication. 2. Stable lung base opacity consistent with atelectasis. 3. Remaining support apparatus is well positioned. Electronically Signed   By: Lajean Manes M.D.   On: 10/30/2015 09:04   Dg Chest Port 1  View  Result Date: 10/29/2015 CLINICAL DATA:  Post CABG. EXAM: PORTABLE CHEST 1 VIEW COMPARISON:  10/28/2015. FINDINGS: Cardiomegaly. Status post CABG. Status post AVR. Mediastinal widening status post aortic root replacement. Swan-Ganz catheter tip RIGHT ventricle, and should be advanced. Orogastric tube in the stomach. LEFT chest tube good position. Endotracheal tube 7 cm above carina. Mediastinal tube good position. No pneumothorax. Mild vascular congestion. IMPRESSION: Swan-Ganz catheter should be advanced into the RIGHT or LEFT pulmonary artery as it is too proximal. Postsurgical changes without other adverse features. These results will be called to the ordering clinician or representative by the Radiologist Assistant, and communication documented in the PACS or zVision Dashboard. Electronically Signed   By: Staci Righter M.D.   On: 10/29/2015 17:15   US Thoracentesis Asp Pleural Space W/img Guide  Result Date: 11/05/2015 INDICATION: Reason CABG. Right-sided pleural effusion. Request therapeutic thoracentesis. EXAM: ULTRASOUND GUIDED RIGHT THORACENTESIS MEDICATIONS: None. COMPLICATIONS: None immediate. PROCEDURE: An ultrasound guided thoracentesis was thoroughly discussed with the patient and questions answered. The benefits, risks, alternatives and complications were also discussed. The patient understands and wishes to proceed with the procedure. Written consent was obtained. Ultrasound was performed to localize and mark an adequate pocket of fluid in the right chest. The area was then prepped and draped in the normal sterile fashion. 1% Lidocaine was used for local anesthesia. Under ultrasound guidance a Safe-T-Centesis catheter was introduced. Thoracentesis was performed. The catheter was removed and a dressing applied. FINDINGS: A total of approximately 700 mL of bloody pleural fluid was removed. IMPRESSION: Successful ultrasound guided right thoracentesis yielding 700 mL of pleural fluid. Read by:  Ascencion Dike PA-C Electronically Signed   By: Corrie Mckusick D.O.   On: 11/05/2015 15:13     Filed Weights   11/24/15 1205 11/24/15 1820 11/24/15 1857  Weight: 81.2 kg (179 lb) 81.6 kg (179 lb 14.3 oz) 81.6 kg (179 lb 14.3 oz)     Microbiology: Recent Results (from the past 240 hour(s))  Urine culture     Status: Abnormal   Collection Time: 11/24/15  3:51 PM  Result Value Ref Range Status   Specimen Description URINE, RANDOM  Final   Special Requests NONE  Final   Culture >=100,000 COLONIES/mL KLEBSIELLA PNEUMONIAE (A)  Final   Report Status 11/27/2015 FINAL  Final   Organism ID, Bacteria KLEBSIELLA PNEUMONIAE (A)  Final      Susceptibility   Klebsiella pneumoniae - MIC*    AMPICILLIN >=32 RESISTANT Resistant     CEFAZOLIN <=4 SENSITIVE Sensitive     CEFTRIAXONE <=1 SENSITIVE Sensitive     CIPROFLOXACIN <=0.25 SENSITIVE Sensitive     GENTAMICIN <=1 SENSITIVE Sensitive     IMIPENEM <=0.25 SENSITIVE Sensitive     NITROFURANTOIN 32 SENSITIVE Sensitive  TRIMETH/SULFA <=20 SENSITIVE Sensitive     AMPICILLIN/SULBACTAM 4 SENSITIVE Sensitive     PIP/TAZO <=4 SENSITIVE Sensitive     Extended ESBL NEGATIVE Sensitive     * >=100,000 COLONIES/mL KLEBSIELLA PNEUMONIAE  Culture, blood (Routine X 2) w Reflex to ID Panel     Status: None (Preliminary result)   Collection Time: 11/24/15  4:30 PM  Result Value Ref Range Status   Specimen Description BLOOD RIGHT ANTECUBITAL  Final   Special Requests BOTTLES DRAWN AEROBIC AND ANAEROBIC 5CC  Final   Culture NO GROWTH 3 DAYS  Final   Report Status PENDING  Incomplete  Culture, blood (Routine X 2) w Reflex to ID Panel     Status: None (Preliminary result)   Collection Time: 11/24/15  4:58 PM  Result Value Ref Range Status   Specimen Description BLOOD LEFT ARM  Final   Special Requests BOTTLES DRAWN AEROBIC AND ANAEROBIC 5CC  Final   Culture NO GROWTH 3 DAYS  Final   Report Status PENDING  Incomplete  C difficile quick scan w PCR reflex      Status: None   Collection Time: 11/25/15  6:18 PM  Result Value Ref Range Status   C Diff antigen NEGATIVE NEGATIVE Final   C Diff toxin NEGATIVE NEGATIVE Final   C Diff interpretation No C. difficile detected.  Final  Clostridium Difficile by PCR     Status: None   Collection Time: 11/25/15  7:41 PM  Result Value Ref Range Status   Toxigenic C Difficile by pcr NEGATIVE NEGATIVE Final    Comment: Patient is colonized with non toxigenic C. difficile. May not need treatment unless significant symptoms are present.       Blood Culture    Component Value Date/Time   SDES BLOOD LEFT ARM 11/24/2015 1658   SPECREQUEST BOTTLES DRAWN AEROBIC AND ANAEROBIC 5CC 11/24/2015 1658   CULT NO GROWTH 3 DAYS 11/24/2015 1658   REPTSTATUS PENDING 11/24/2015 1658      Labs: Results for orders placed or performed during the hospital encounter of 11/24/15 (from the past 48 hour(s))  Glucose, capillary     Status: Abnormal   Collection Time: 11/25/15 12:41 PM  Result Value Ref Range   Glucose-Capillary 122 (H) 65 - 99 mg/dL  Glucose, capillary     Status: Abnormal   Collection Time: 11/25/15  5:31 PM  Result Value Ref Range   Glucose-Capillary 141 (H) 65 - 99 mg/dL  C difficile quick scan w PCR reflex     Status: None   Collection Time: 11/25/15  6:18 PM  Result Value Ref Range   C Diff antigen NEGATIVE NEGATIVE   C Diff toxin NEGATIVE NEGATIVE   C Diff interpretation No C. difficile detected.   Clostridium Difficile by PCR     Status: None   Collection Time: 11/25/15  7:41 PM  Result Value Ref Range   Toxigenic C Difficile by pcr NEGATIVE NEGATIVE    Comment: Patient is colonized with non toxigenic C. difficile. May not need treatment unless significant symptoms are present.  Glucose, capillary     Status: Abnormal   Collection Time: 11/25/15  8:10 PM  Result Value Ref Range   Glucose-Capillary 168 (H) 65 - 99 mg/dL  Procalcitonin     Status: None   Collection Time: 11/26/15  6:09 AM   Result Value Ref Range   Procalcitonin <0.10 ng/mL    Comment:        Interpretation: PCT (Procalcitonin) <= 0.5  ng/mL: Systemic infection (sepsis) is not likely. Local bacterial infection is possible. (NOTE)         ICU PCT Algorithm               Non ICU PCT Algorithm    ----------------------------     ------------------------------         PCT < 0.25 ng/mL                 PCT < 0.1 ng/mL     Stopping of antibiotics            Stopping of antibiotics       strongly encouraged.               strongly encouraged.    ----------------------------     ------------------------------       PCT level decrease by               PCT < 0.25 ng/mL       >= 80% from peak PCT       OR PCT 0.25 - 0.5 ng/mL          Stopping of antibiotics                                             encouraged.     Stopping of antibiotics           encouraged.    ----------------------------     ------------------------------       PCT level decrease by              PCT >= 0.25 ng/mL       < 80% from peak PCT        AND PCT >= 0.5 ng/mL            Continuin g antibiotics                                              encouraged.       Continuing antibiotics            encouraged.    ----------------------------     ------------------------------     PCT level increase compared          PCT > 0.5 ng/mL         with peak PCT AND          PCT >= 0.5 ng/mL             Escalation of antibiotics                                          strongly encouraged.      Escalation of antibiotics        strongly encouraged.   CBC     Status: Abnormal   Collection Time: 11/26/15  6:09 AM  Result Value Ref Range   WBC 9.0 4.0 - 10.5 K/uL   RBC 3.61 (L) 4.22 - 5.81 MIL/uL   Hemoglobin 10.1 (L) 13.0 - 17.0 g/dL   HCT 31.5 (L) 39.0 - 52.0 %   MCV 87.3 78.0 - 100.0 fL   MCH  28.0 26.0 - 34.0 pg   MCHC 32.1 30.0 - 36.0 g/dL   RDW 14.8 11.5 - 15.5 %   Platelets 326 150 - 400 K/uL  Comprehensive metabolic panel     Status:  Abnormal   Collection Time: 11/26/15  6:09 AM  Result Value Ref Range   Sodium 138 135 - 145 mmol/L   Potassium 3.9 3.5 - 5.1 mmol/L   Chloride 103 101 - 111 mmol/L   CO2 28 22 - 32 mmol/L   Glucose, Bld 113 (H) 65 - 99 mg/dL   BUN <5 (L) 6 - 20 mg/dL   Creatinine, Ser 1.10 0.61 - 1.24 mg/dL   Calcium 8.8 (L) 8.9 - 10.3 mg/dL   Total Protein 6.7 6.5 - 8.1 g/dL   Albumin 2.9 (L) 3.5 - 5.0 g/dL   AST 24 15 - 41 U/L   ALT 23 17 - 63 U/L   Alkaline Phosphatase 75 38 - 126 U/L   Total Bilirubin 0.6 0.3 - 1.2 mg/dL   GFR calc non Af Amer >60 >60 mL/min   GFR calc Af Amer >60 >60 mL/min    Comment: (NOTE) The eGFR has been calculated using the CKD EPI equation. This calculation has not been validated in all clinical situations. eGFR's persistently <60 mL/min signify possible Chronic Kidney Disease.    Anion gap 7 5 - 15  Glucose, capillary     Status: Abnormal   Collection Time: 11/26/15  7:18 AM  Result Value Ref Range   Glucose-Capillary 109 (H) 65 - 99 mg/dL  Glucose, capillary     Status: Abnormal   Collection Time: 11/26/15 11:36 AM  Result Value Ref Range   Glucose-Capillary 215 (H) 65 - 99 mg/dL  Glucose, capillary     Status: Abnormal   Collection Time: 11/26/15  4:57 PM  Result Value Ref Range   Glucose-Capillary 132 (H) 65 - 99 mg/dL  Glucose, capillary     Status: Abnormal   Collection Time: 11/26/15  9:53 PM  Result Value Ref Range   Glucose-Capillary 147 (H) 65 - 99 mg/dL  Glucose, capillary     Status: Abnormal   Collection Time: 11/27/15  7:52 AM  Result Value Ref Range   Glucose-Capillary 122 (H) 65 - 99 mg/dL   Comment 1 Notify RN      Lipid Panel     Component Value Date/Time   CHOL 228 (H) 07/23/2015 0921   TRIG 77 07/23/2015 0921   HDL 98 07/23/2015 0921   CHOLHDL 2.3 07/23/2015 0921   VLDL 15 07/23/2015 0921   LDLCALC 115 07/23/2015 0921   LDLDIRECT 112.6 05/14/2012 1031     Lab Results  Component Value Date   HGBA1C 11.2 (H) 10/29/2015    HGBA1C 7.2 (H) 12/22/2014   HGBA1C 7.7 (H) 09/09/2014     Lab Results  Component Value Date   MICROALBUR 3.9 (H) 12/29/2013   LDLCALC 115 07/23/2015   CREATININE 1.10 11/26/2015     HPI   70 year old male with PMH of aortic stenosis, CAD, type II DM, essential hypertension, hyperlipidemia, recently discharged from the hospital on 11/08/15 status post 4 vessel CABG and AVR ([employer] pericardial tissue valve) for management of CAD and aortic stenosis. Postoperative course was complicated by PAF which was treated with amiodarone and Xarelto.The patient was also recently discharged on 11/20/2015 after having nine-day hospital stay for which she was treated for orthostatic hypotension, nausea and vomiting, and UTI. The patient was discharged home with amiodarone  and rivaroxaban. In addition, Midodrinewas started on 11/18/2015 and titrated up to 10 mg 3 times a day.   patient's ARB, diuretics, and beta blockers were discontinued due to his orthostasis. The patient presented again 10/11 with 2-3day history of nausea and vomiting. Patient found to have leukocytosis, HCAP, atrial flutter. Cardiology was consulted.  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 from his multiple episodes of medical issues in the past 1-2 months, vs UTI  Resumed diabetic diet, improved  -His diabetes mellitus is poorly controlled with hemoglobin A1c 11.2 on 10/29/2015 which has also contributed to his functional gastroparesis -add PPI -add reglan if no improvement  Acute kidney injury, baseline creatinine 0.92, presented with a creatinine of 1.55 -Secondary to volume depletion Resolved, 1.10 on day of DC    Pulmonary Infiltrate -Not completely convinced the patient has pneumonia as he is not hypoxemic nor short of breath -Certainly, with the patient's recurrent vomiting he may have a component of aspiration  pneumonitis -Procalcitonin 0.10 Discontinue Zosyn and switched to levofloxacin for 5 days for HCAP along with rx of UTI    Diarrhea C. difficile negative, recent GI pathogen panel was negative Started  patient on a probiotic   Near syncope  history of pericardial AVR last month. He has CAD with CABG as well. He has had atrial fib Presented this admission with atrial fibrillation and started on amiodarone drip, converted to normal sinus rhythm - Patient has had several days' history of nausea, vomiting and diarrhea,He came back to the hosp on 10/11 with N/V and atrial fib with rapid rate.  He had significant orthostatic hypotension treated with midodrine and TED hose. Antihypertensives were held. Likely related to long standing poorly controlled diabetes.  - He has been repeatedly counseled regarding measures to minimize her symptoms from orthostatic hypotension: Gradual transition from supine to sitting to standing and ambulating, adequate hydration, increasing salt intake, elevating legs up when he is sitting, wear TED hose when ambulating. He verbalizes understanding. continue HHPT , midodrine and TEDs  Uncontrolled DM 2 - Hemoglobin A1c 10/29/15: 11.2. Metformin held due to current acute illness. Placed on reduced dose Lantus and SSIIn the hospital. -Reasonable inpatient control. - Resume prior home medications at discharge.  Essential hypertension - Controlled. Orthostatic changes. Started midodrine 10/5 and titrated up to 10 MG 3 times a day today. Change lower extremity TED hoses to thigh high.  Paroxysmal A. Fib - Patient presented with atrial fibrillation with RVR -Converted to sinus rhythm with amiodarone drip -Appreciate cardiology consultation Maintaining sinus rhythm with oral amiodarone. 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  - LIMA to LAD, SVG to first and second OM, SVG to RCA.        Blood pressure 123/66, pulse 81, temperature 98.2 F (36.8 C), temperature source Oral, resp. rate 16, height 6' (1.829 m), weight 81.6 kg (179 lb 14.3 oz), SpO2 99 %.   General exam: Appears calm and comfortable  Respiratory system: Clear to auscultation. Respiratory effort normal. Cardiovascular system: S1 & S2 heard, RRR. No JVD, murmurs, rubs, gallops or clicks. No pedal edema. Gastrointestinal system: Abdomen is nondistended, soft and nontender. No organomegaly or masses felt. Normal bowel sounds heard. Central nervous system: Alert and oriented. No focal neurological deficits. Extremities:  Symmetric 5 x 5 power. Skin: No rashes, lesions or ulcers Psychiatry: Judgement and insight appear normal. Mood & affect appropriate.    Follow-up Information    Drema Pry, DO. Schedule an appointment as soon as possible for a visit in 2 day(s).   Specialty:  Internal Medicine Why:  hospital follow up Contact information: [place] [address] [phone]        Thompson Grayer, MD. Schedule an appointment as soon as possible for a visit in 2 day(s).   Specialty:  Cardiology Why:  call a fib clinic ASAP for appointment in 3-4 days Contact information: [address] [place] [address] [phone]           Signed: Reyne Dumas 11/27/2015, 11:40 AM        Time spent >45 mins

## 2015-11-28 ENCOUNTER — Other Ambulatory Visit: Payer: Self-pay | Admitting: Internal Medicine

## 2015-11-28 DIAGNOSIS — E1149 Type 2 diabetes mellitus with other diabetic neurological complication: Secondary | ICD-10-CM

## 2015-11-28 DIAGNOSIS — IMO0002 Reserved for concepts with insufficient information to code with codable children: Secondary | ICD-10-CM

## 2015-11-28 DIAGNOSIS — E1165 Type 2 diabetes mellitus with hyperglycemia: Principal | ICD-10-CM

## 2015-11-29 ENCOUNTER — Telehealth (HOSPITAL_COMMUNITY): Payer: Self-pay | Admitting: *Deleted

## 2015-11-29 ENCOUNTER — Telehealth: Payer: Self-pay | Admitting: *Deleted

## 2015-11-29 LAB — CULTURE, BLOOD (ROUTINE X 2)
CULTURE: NO GROWTH
Culture: NO GROWTH

## 2015-11-29 NOTE — Telephone Encounter (Signed)
-----   Message from Juluis Mire, RN sent at 11/29/2015  9:47 AM EDT -----   ----- Message ----- From: Thompson Grayer, MD Sent: 11/27/2015  11:18 AM To: Juluis Mire, RN  Please bring in to see Butch Penny this week for follow-up of post op afib.

## 2015-11-29 NOTE — Telephone Encounter (Signed)
Evelena Peat from [facility] called to inquire about patient's sliding scale as it was not detailed in the patient's discharge summary from his most recent hospital admission. Made Evelena Peat aware I must speak with patient to get verbal approval to speak about his care. Spoke with patient (confirmed patient by 2 identifiers) and he gave verbal approval to give information to [place] at [place].   Evelena Peat stated he will be sending paperwork to [name], NP and Dr. Elease Hashimoto about his medication management and home health orders. Made him aware that neither providers can give orders until patient comes in on Wednesday to establish with [facility]. Evelena Peat verbalized understanding and is aware patient has an appointment on Wednesday.

## 2015-11-29 NOTE — Telephone Encounter (Signed)
LMOM for pt to [facility]bk to sched appt 

## 2015-11-30 ENCOUNTER — Encounter (HOSPITAL_COMMUNITY): Payer: Self-pay

## 2015-11-30 ENCOUNTER — Inpatient Hospital Stay (HOSPITAL_COMMUNITY)
Admission: EM | Admit: 2015-11-30 | Discharge: 2015-12-03 | DRG: 392 | Disposition: A | Discharge status: Home / Self Care | Payer: Medicare Other | Attending: Internal Medicine | Admitting: Internal Medicine

## 2015-11-30 ENCOUNTER — Emergency Department (HOSPITAL_COMMUNITY): Payer: Medicare Other

## 2015-11-30 ENCOUNTER — Other Ambulatory Visit: Payer: Self-pay

## 2015-11-30 ENCOUNTER — Other Ambulatory Visit (HOSPITAL_COMMUNITY): Payer: Self-pay

## 2015-11-30 DIAGNOSIS — I25810 Atherosclerosis of coronary artery bypass graft(s) without angina pectoris: Secondary | ICD-10-CM

## 2015-11-30 DIAGNOSIS — N390 Urinary tract infection, site not specified: Secondary | ICD-10-CM

## 2015-11-30 DIAGNOSIS — N3000 Acute cystitis without hematuria: Secondary | ICD-10-CM

## 2015-11-30 DIAGNOSIS — S82142A Displaced bicondylar fracture of left tibia, initial encounter for closed fracture: Secondary | ICD-10-CM | POA: Diagnosis present

## 2015-11-30 DIAGNOSIS — Z794 Long term (current) use of insulin: Secondary | ICD-10-CM | POA: Diagnosis not present

## 2015-11-30 DIAGNOSIS — Z7901 Long term (current) use of anticoagulants: Secondary | ICD-10-CM | POA: Diagnosis not present

## 2015-11-30 DIAGNOSIS — Z9889 Other specified postprocedural states: Secondary | ICD-10-CM

## 2015-11-30 DIAGNOSIS — Z953 Presence of xenogenic heart valve: Secondary | ICD-10-CM

## 2015-11-30 DIAGNOSIS — M160 Bilateral primary osteoarthritis of hip: Secondary | ICD-10-CM | POA: Diagnosis present

## 2015-11-30 DIAGNOSIS — IMO0002 Reserved for concepts with insufficient information to code with codable children: Secondary | ICD-10-CM

## 2015-11-30 DIAGNOSIS — Z951 Presence of aortocoronary bypass graft: Secondary | ICD-10-CM | POA: Diagnosis not present

## 2015-11-30 DIAGNOSIS — I2510 Atherosclerotic heart disease of native coronary artery without angina pectoris: Secondary | ICD-10-CM | POA: Diagnosis present

## 2015-11-30 DIAGNOSIS — E1143 Type 2 diabetes mellitus with diabetic autonomic (poly)neuropathy: Secondary | ICD-10-CM | POA: Diagnosis present

## 2015-11-30 DIAGNOSIS — Z79899 Other long term (current) drug therapy: Secondary | ICD-10-CM

## 2015-11-30 DIAGNOSIS — N12 Tubulo-interstitial nephritis, not specified as acute or chronic: Secondary | ICD-10-CM | POA: Diagnosis not present

## 2015-11-30 DIAGNOSIS — E785 Hyperlipidemia, unspecified: Secondary | ICD-10-CM | POA: Diagnosis present

## 2015-11-30 DIAGNOSIS — I480 Paroxysmal atrial fibrillation: Secondary | ICD-10-CM | POA: Diagnosis present

## 2015-11-30 DIAGNOSIS — R112 Nausea with vomiting, unspecified: Secondary | ICD-10-CM

## 2015-11-30 DIAGNOSIS — E860 Dehydration: Secondary | ICD-10-CM | POA: Diagnosis present

## 2015-11-30 DIAGNOSIS — I951 Orthostatic hypotension: Secondary | ICD-10-CM

## 2015-11-30 DIAGNOSIS — J9811 Atelectasis: Secondary | ICD-10-CM | POA: Diagnosis present

## 2015-11-30 DIAGNOSIS — E1165 Type 2 diabetes mellitus with hyperglycemia: Secondary | ICD-10-CM | POA: Diagnosis present

## 2015-11-30 DIAGNOSIS — K3184 Gastroparesis: Secondary | ICD-10-CM | POA: Diagnosis present

## 2015-11-30 DIAGNOSIS — K449 Diaphragmatic hernia without obstruction or gangrene: Secondary | ICD-10-CM | POA: Diagnosis present

## 2015-11-30 DIAGNOSIS — R1110 Vomiting, unspecified: Principal | ICD-10-CM | POA: Diagnosis present

## 2015-11-30 DIAGNOSIS — I10 Essential (primary) hypertension: Secondary | ICD-10-CM | POA: Diagnosis present

## 2015-11-30 DIAGNOSIS — E1140 Type 2 diabetes mellitus with diabetic neuropathy, unspecified: Secondary | ICD-10-CM

## 2015-11-30 LAB — COMPREHENSIVE METABOLIC PANEL
ALK PHOS: 82 U/L (ref 38–126)
ALT: 18 U/L (ref 17–63)
AST: 21 U/L (ref 15–41)
Albumin: 3.4 g/dL — ABNORMAL LOW (ref 3.5–5.0)
Anion gap: 10 (ref 5–15)
BILIRUBIN TOTAL: 0.5 mg/dL (ref 0.3–1.2)
BUN: 5 mg/dL — ABNORMAL LOW (ref 6–20)
CALCIUM: 9.8 mg/dL (ref 8.9–10.3)
CO2: 27 mmol/L (ref 22–32)
CREATININE: 1.08 mg/dL (ref 0.61–1.24)
Chloride: 100 mmol/L — ABNORMAL LOW (ref 101–111)
GFR calc Af Amer: >60 mL/min (ref >60)
Glucose, Bld: 155 mg/dL — ABNORMAL HIGH (ref 65–99)
Potassium: 4.4 mmol/L (ref 3.5–5.1)
Sodium: 137 mmol/L (ref 135–145)
TOTAL PROTEIN: 7.9 g/dL (ref 6.5–8.1)

## 2015-11-30 LAB — CBC WITH DIFFERENTIAL/PLATELET
Basophils Absolute: 0 10*3/uL (ref 0.0–0.1)
Basophils Relative: 0 %
Eosinophils Absolute: 0.1 10*3/uL (ref 0.0–0.7)
Eosinophils Relative: 1 %
HEMATOCRIT: 34.7 % — AB (ref 39.0–52.0)
HEMOGLOBIN: 11.2 g/dL — AB (ref 13.0–17.0)
LYMPHS ABS: 2 10*3/uL (ref 0.7–4.0)
LYMPHS PCT: 17 %
MCH: 28.1 pg (ref 26.0–34.0)
MCHC: 32.3 g/dL (ref 30.0–36.0)
MCV: 87.2 fL (ref 78.0–100.0)
MONOS PCT: 7 %
Monocytes Absolute: 0.8 10*3/uL (ref 0.1–1.0)
NEUTROS PCT: 75 %
Neutro Abs: 8.7 10*3/uL — ABNORMAL HIGH (ref 1.7–7.7)
Platelets: 342 10*3/uL (ref 150–400)
RBC: 3.98 MIL/uL — AB (ref 4.22–5.81)
RDW: 15.1 % (ref 11.5–15.5)
WBC: 11.6 10*3/uL — AB (ref 4.0–10.5)

## 2015-11-30 LAB — URINALYSIS, ROUTINE W REFLEX MICROSCOPIC
BILIRUBIN URINE: NEGATIVE
Glucose, UA: NEGATIVE mg/dL
KETONES UR: NEGATIVE mg/dL
NITRITE: NEGATIVE
PH: 6.5 (ref 5.0–8.0)
PROTEIN: 30 mg/dL — AB
Specific Gravity, Urine: 1.011 (ref 1.005–1.030)

## 2015-11-30 LAB — URINE MICROSCOPIC-ADD ON

## 2015-11-30 LAB — PROCALCITONIN: Procalcitonin: <0.1 ng/mL

## 2015-11-30 LAB — GLUCOSE, CAPILLARY: GLUCOSE-CAPILLARY: 137 mg/dL — AB (ref 65–99)

## 2015-11-30 MED ORDER — ACETAMINOPHEN 650 MG RE SUPP: 650.0000 mg | Freq: Four times a day (QID) | RECTAL | Status: DC | PRN

## 2015-11-30 MED ORDER — ATORVASTATIN CALCIUM 40 MG PO TABS
40.0000 mg | ORAL_TABLET | Freq: Every day | ORAL | Status: DC
  Administered 2015-11-30 – 2015-12-01 (×2): 40 mg via ORAL
  Filled 2015-11-30 (×2): qty 1

## 2015-11-30 MED ORDER — INSULIN GLARGINE 100 UNIT/ML ~~LOC~~ SOLN
8.0000 [IU] | Freq: Every day | SUBCUTANEOUS | Status: DC
  Administered 2015-11-30 – 2015-12-02 (×3): 8 [IU] via SUBCUTANEOUS
  Filled 2015-11-30 (×4): qty 0.08

## 2015-11-30 MED ORDER — SODIUM CHLORIDE 0.9 % IV SOLN: INTRAVENOUS | Status: DC

## 2015-11-30 MED ORDER — RIVAROXABAN 20 MG PO TABS
20.0000 mg | ORAL_TABLET | Freq: Every day | ORAL | Status: DC
  Filled 2015-11-30: qty 1

## 2015-11-30 MED ORDER — SODIUM CHLORIDE 0.9% FLUSH
3.0000 mL | Freq: Two times a day (BID) | INTRAVENOUS | Status: DC
  Administered 2015-12-01 – 2015-12-02 (×3): 3 mL via INTRAVENOUS

## 2015-11-30 MED ORDER — FAMOTIDINE 10 MG PO TABS
10.0000 mg | ORAL_TABLET | Freq: Two times a day (BID) | ORAL | Status: DC | PRN
  Filled 2015-11-30: qty 1

## 2015-11-30 MED ORDER — METOCLOPRAMIDE HCL 5 MG/ML IJ SOLN: 5.0000 mg | Freq: Four times a day (QID) | INTRAMUSCULAR | Status: DC | PRN

## 2015-11-30 MED ORDER — ONDANSETRON HCL 4 MG/2ML IJ SOLN: 4.0000 mg | Freq: Four times a day (QID) | INTRAMUSCULAR | Status: DC | PRN

## 2015-11-30 MED ORDER — SACCHAROMYCES BOULARDII 250 MG PO CAPS
250.0000 mg | ORAL_CAPSULE | Freq: Two times a day (BID) | ORAL | Status: DC
  Administered 2015-11-30 – 2015-12-02 (×4): 250 mg via ORAL
  Filled 2015-11-30 (×4): qty 1

## 2015-11-30 MED ORDER — POTASSIUM CHLORIDE IN NACL 20-0.9 MEQ/L-% IV SOLN
INTRAVENOUS | Status: DC
  Administered 2015-11-30 – 2015-12-01 (×2): via INTRAVENOUS
  Filled 2015-11-30 (×3): qty 1000

## 2015-11-30 MED ORDER — INSULIN ASPART 100 UNIT/ML ~~LOC~~ SOLN
0.0000 [IU] | Freq: Three times a day (TID) | SUBCUTANEOUS | Status: DC
  Administered 2015-12-01: 2 [IU] via SUBCUTANEOUS
  Administered 2015-12-01: 1 [IU] via SUBCUTANEOUS

## 2015-11-30 MED ORDER — DEXTROSE 5 % IV SOLN
1.0000 g | INTRAVENOUS | Status: DC
  Administered 2015-11-30 – 2015-12-01 (×2): 1 g via INTRAVENOUS
  Filled 2015-11-30 (×3): qty 10

## 2015-11-30 MED ORDER — METOCLOPRAMIDE HCL 5 MG/ML IJ SOLN
10.0000 mg | Freq: Once | INTRAMUSCULAR | Status: AC
  Administered 2015-11-30: 10 mg via INTRAVENOUS
  Filled 2015-11-30: qty 2

## 2015-11-30 MED ORDER — ACETAMINOPHEN 325 MG PO TABS: 650.0000 mg | ORAL_TABLET | Freq: Four times a day (QID) | ORAL | Status: DC | PRN

## 2015-11-30 MED ORDER — INSULIN ASPART 100 UNIT/ML ~~LOC~~ SOLN: 0.0000 [IU] | Freq: Every day | SUBCUTANEOUS | Status: DC

## 2015-11-30 MED ORDER — SODIUM CHLORIDE 0.9 % IV BOLUS (SEPSIS)
500.0000 mL | Freq: Once | INTRAVENOUS | Status: AC
  Administered 2015-11-30: 500 mL via INTRAVENOUS

## 2015-11-30 MED ORDER — AMIODARONE HCL 200 MG PO TABS
200.0000 mg | ORAL_TABLET | Freq: Two times a day (BID) | ORAL | Status: DC
  Administered 2015-11-30 – 2015-12-02 (×4): 200 mg via ORAL
  Filled 2015-11-30 (×4): qty 1

## 2015-11-30 MED ORDER — MIDODRINE HCL 5 MG PO TABS
10.0000 mg | ORAL_TABLET | Freq: Three times a day (TID) | ORAL | Status: DC
  Administered 2015-12-01 – 2015-12-02 (×3): 10 mg via ORAL
  Filled 2015-11-30 (×3): qty 2

## 2015-11-30 MED ORDER — ONDANSETRON HCL 4 MG PO TABS: 4.0000 mg | ORAL_TABLET | Freq: Four times a day (QID) | ORAL | Status: DC | PRN

## 2015-11-30 NOTE — Progress Notes (Signed)
New Admission Note:  Arrival Method: Stretcher  Mental Orientation: Alert and oriented x 4 Telemetry: Box 21  Assessment: Completed Skin: Warm and dry. Old incision to chest with steri and bandaid IV: NSL Pain: Denies Tubes: N/A Safety Measures: Safety Fall Prevention Plan was given, discussed and signed. Admission: Completed [facility] Orientation: Patient has been orientated to the room, unit and the staff. Family: Wife   Orders have been reviewed and implemented. Will continue to monitor the patient. Call light has been placed within reach and bed alarm has been activated.   Sima Matas BSN, RN  Phone Number: [phone]

## 2015-11-30 NOTE — ED Notes (Signed)
PO fluids at bedside and pt encouraged to drink. Family at bedside

## 2015-11-30 NOTE — ED Notes (Signed)
RN aware of orthostatic vital signs

## 2015-11-30 NOTE — ED Triage Notes (Signed)
Pt arrives [facility] with c/o dizziness and nausea /vomiting with sitting at side of bed. Pt had CABG  And AAA repair 3 weeks ago. Seen here and admitted for same 1 week ago with discharge  Saturday. Pt currently on Levaquin.

## 2015-11-30 NOTE — ED Provider Notes (Signed)
[facility] DEPT Provider Note   CSN: [id number] Arrival date & time: 11/30/15  1029     History   Chief Complaint Chief Complaint  Patient presents with  . Dizziness    HPI [name] is a 70 y.o. male.  Patient with recent CABG and aortic valve replacement in 9/17, 2 subsequent hospitalizations for mild AKI, persistent vomiting, UTI/pneumonia, orthostatic hypotension, most recent hospitalization discharge 11/27/15 -- presents with c/o multiple episodes of vomiting starting this morning. Also feels very dizzy at times, described as lightheadedness. No fevers, URI sx, CP, SOB, abdominal pain. Last BM yesterday and was soft, no blood. No urinary symptoms. No rashes. Patient did notice some L chest wall tenderness in certain positions this morning. No overlying redness or drainage. He recently completed course of Levaquin for HCAP. The onset of this condition was acute. The course is constant. Aggravating factors: none. Alleviating factors: none.        Past Medical History:  Diagnosis Date  . 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 Carpal tunnel syndrome 09/24/2014   Bilateral  . 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 Diabetes mellitus, type 2 ([place])   . Erectile dysfunction   . Essential hypertension   . Hyperlipidemia     Patient Active Problem List   Diagnosis Date Noted  . HCAP (healthcare-associated pneumonia) 11/24/2015  . AKI (acute kidney injury) ([place]) 11/24/2015  . Paroxysmal atrial fibrillation ([place]) 11/24/2015  . Orthostatic hypotension   . Difficulty in walking, not elsewhere classified   . Fungus present in urine   . Acute cystitis with hematuria   . Near syncope 11/14/2015  . Hx of CABG (Sept 2017) 11/13/2015  . Atrial fibrillation (post op after CABG Sept 2017) 11/13/2015  . Nausea vomiting and diarrhea 11/13/2015  . Orthostatic syncope 11/13/2015  . Hematuria, gross 11/13/2015  .  Dehydration with hyponatremia 11/13/2015  . Elevated troponin 11/13/2015  . Coronary artery disease involving native coronary artery of native heart without angina pectoris   . Aortic stenosis s/p tissue valve (Sept 2017) 10/28/2015  . Atypical angina ([place]) 10/27/2015  . Abnormal nuclear stress test 10/27/2015  . Coronary artery disease involving native heart with angina pectoris ([place]) 10/27/2015  . Aortic aneurysm ([place]) 07/23/2015  . Carpal tunnel syndrome 09/24/2014  . Right hand pain 12/29/2013  . Left carotid bruit 05/07/2013  . Ganglion cyst 12/30/2012  . Diabetic retinopathy ([place]) 09/23/2012  . Diminished pulses in lower extremity 06/17/2012  . Chest pain 03/22/2011  . Paresthesia of foot 11/04/2010  . Aortic valve disease 04/06/2010  . HYPERTROPHY PROSTATE W/UR OBST & OTH LUTS 03/15/2009  . Hyperlipidemia 12/13/2007  . Type II diabetes mellitus with neurological manifestations, uncontrolled ([place]) 11/29/2007  . ERECTILE DYSFUNCTION 11/29/2007  . Essential hypertension 11/29/2007    Past Surgical History:  Procedure Laterality Date  . AORTIC VALVE REPLACEMENT N/A 10/29/2015   Procedure: AORTIC VALVE REPLACEMENT (AVR) WITH 23MM MAGNA EASE TISSUE VALVE.;  Surgeon: [name], MD;  Location: [facility];  Service: Open Heart Surgery;  Laterality: N/A;  . CARDIAC CATHETERIZATION N/A 10/27/2015   Procedure: Left Heart Cath and Coronary Angiography;  Surgeon: [name], MD;  Location: [facility] CV LAB;  Service: Cardiovascular;  Laterality: N/A;  . CORONARY ARTERY BYPASS GRAFT N/A 10/29/2015   Procedure: CORONARY ARTERY BYPASS GRAFTING (CABG) x Four UTILIZING THE LEFT INTERNAL MAMMARY ARTERY AND ENDOSCOPICALLY HARVESTED RIGHT SAPEHENEOUS VEINS.;  Surgeon: [name], MD;  Location: [facility];  Service: Open Heart Surgery;  Laterality: N/A;  . CYST REMOVAL HAND Right   . REPLACEMENT ASCENDING AORTA N/A 10/29/2015   Procedure: REPLACEMENT OF ASCENDING AORTA USING 34MM X 30CM  WOVEN DOUBLE VELOUR VASCULAR GRAFT.;  Surgeon: [name], MD;  Location: [facility];  Service: Open Heart Surgery;  Laterality: N/A;  . TEE WITHOUT CARDIOVERSION N/A 10/29/2015   Procedure: TRANSESOPHAGEAL ECHOCARDIOGRAM (TEE);  Surgeon: [name], MD;  Location: [facility];  Service: Open Heart Surgery;  Laterality: N/A;       Home Medications    Prior to Admission medications   Medication Sig Start Date End Date Taking? Authorizing Provider  amiodarone (PACERONE) 200 MG tablet Take 1 tablet (200 mg total) by mouth daily. Patient taking differently: Take 200 mg by mouth 2 (two) times daily.  11/23/15  Yes Thayer Headings, MD  atorvastatin (LIPITOR) 40 MG tablet Take 1 tablet (40 mg total) by mouth daily. 07/29/15  Yes Thayer Headings, MD  famotidine (PEPCID AC) 10 MG chewable tablet Chew 1 tablet (10 mg total) by mouth 2 (two) times daily as needed for heartburn. 10/11/15  Yes Thayer Headings, MD  ferrous gluconate (FERGON) 324 MG tablet Take 1 tablet (324 mg total) by mouth 2 (two) times daily with a meal. 11/08/15  Yes Wayne E Gold, PA-C  folic acid (FOLVITE) 1 MG tablet Take 1 tablet (1 mg total) by mouth daily. 11/08/15  Yes Wayne E Gold, PA-C  Insulin Glargine (LANTUS SOLOSTAR) 100 UNIT/ML Solostar Pen Inject 20 Units into the skin daily at 10 pm. 11/27/15  Yes Reyne Dumas, MD  insulin lispro (HUMALOG KWIKPEN) 100 UNIT/ML KiwkPen Use 5 to 10 units three times daily 10-15 minutes before meals Patient taking differently: Inject 5-10 Units into the skin 3 (three) times daily before meals.  09/09/14  Yes Doe-Hyun R Shawna Orleans, DO  levofloxacin (LEVAQUIN) 750 MG tablet Take 1 tablet (750 mg total) by mouth daily. 11/28/15 12/03/15 Yes Reyne Dumas, MD  metFORMIN (GLUCOPHAGE) 1000 MG tablet Take 1 tablet (1,000 mg total) by mouth 2 (two) times daily with a meal. 09/07/15  Yes [name], NP  midodrine (PROAMATINE) 10 MG tablet Take 1 tablet (10 mg total) by mouth 3 (three) times daily with meals.  11/20/15  Yes Modena Jansky, MD  ondansetron (ZOFRAN ODT) 4 MG disintegrating tablet Take 1 tablet (4 mg total) by mouth every 8 (eight) hours as needed for nausea or vomiting. 11/11/15  Yes [name], MD  rivaroxaban (XARELTO) 20 MG TABS tablet Take 1 tablet (20 mg total) by mouth daily with supper. 11/08/15  Yes Erin R [name], PA-C  saccharomyces boulardii (FLORASTOR) 250 MG capsule Take 1 capsule (250 mg total) by mouth 2 (two) times daily. 11/27/15 12/07/15 Yes Reyne Dumas, MD  B-D ULTRAFINE III SHORT PEN 31G X 8 MM MISC USE 4 TIMES DAILY AS DIRECTED 11/30/15   [name], NP  glucose blood test strip Use as instructed 12/25/14   [name], NP  oxyCODONE (OXY IR/ROXICODONE) 5 MG immediate release tablet Take 1-2 tablets (5-10 mg total) by mouth every 6 (six) hours as needed for severe pain. 11/08/15   John Giovanni, PA-C    Family History Family History  Problem Relation Age of Onset  . Cancer Father     Throat cancer  . Diabetes Sister     Social History Social History  Substance Use Topics  .  Smoking status: Never Smoker  . Smokeless tobacco: Never Used  . Alcohol use No     Allergies   No known allergies   Review of Systems Review of Systems  Constitutional: Negative for fever.  HENT: Negative for rhinorrhea and sore throat.   Eyes: Negative for redness.  Respiratory: Negative for cough.   Cardiovascular: Positive for chest pain (chest wall, L-sided).  Gastrointestinal: Positive for nausea and vomiting. Negative for abdominal pain and diarrhea.  Genitourinary: Negative for dysuria.  Musculoskeletal: Negative for myalgias.  Skin: Negative for rash.  Neurological: Negative for headaches.     Physical Exam Updated Vital Signs BP 119/72   Pulse 85   Resp 20   Ht 6' (1.829 m)   Wt 81.2 kg   SpO2 97%   BMI 24.28 kg/m   Physical Exam  Constitutional: He appears well-developed and well-nourished.  HENT:  Head: Normocephalic and atraumatic.    Mouth/Throat: Oropharynx is clear and moist and mucous membranes are normal. Mucous membranes are not dry.  Eyes: Conjunctivae are normal. Right eye exhibits no discharge. Left eye exhibits no discharge.  Neck: Trachea normal and normal range of motion. Neck supple. Normal carotid pulses and no JVD present. No muscular tenderness present. Carotid bruit is not present. No tracheal deviation present.  Cardiovascular: Normal rate, regular rhythm, S1 normal, S2 normal, normal heart sounds and intact distal pulses.  Exam reveals no distant heart sounds and no decreased pulses.   No murmur heard. Pulmonary/Chest: Effort normal and breath sounds normal. No respiratory distress. He has no wheezes. He exhibits tenderness (L pectoral, minimal swelling, no redness/warmth, surgical scar well-healing).  Abdominal: Soft. Normal aorta and bowel sounds are normal. There is no tenderness. There is no rebound and no guarding.  Musculoskeletal: He exhibits no edema.  Neurological: He is alert.  Skin: Skin is warm and dry. He is not diaphoretic. No cyanosis. No pallor.  Psychiatric: He has a normal mood and affect.  Nursing note and vitals reviewed.    ED Treatments / Results  Labs (all labs ordered are listed, but only abnormal results are displayed) Labs Reviewed  CBC WITH DIFFERENTIAL/PLATELET - Abnormal; Notable for the following:       Result Value   WBC 11.6 (*)    RBC 3.98 (*)    Hemoglobin 11.2 (*)    HCT 34.7 (*)    Neutro Abs 8.7 (*)    All other components within normal limits  COMPREHENSIVE METABOLIC PANEL - Abnormal; Notable for the following:    Chloride 100 (*)    Glucose, Bld 155 (*)    BUN 5 (*)    Albumin 3.4 (*)    All other components within normal limits  URINALYSIS, ROUTINE W REFLEX MICROSCOPIC (NOT AT [facility]) - Abnormal; Notable for the following:    APPearance HAZY (*)    Hgb urine dipstick TRACE (*)    Protein, ur 30 (*)    Leukocytes, UA LARGE (*)    All other components  within normal limits  URINE MICROSCOPIC-ADD ON - Abnormal; Notable for the following:    Squamous Epithelial / LPF 0-5 (*)    Bacteria, UA FEW (*)    Casts HYALINE CASTS (*)    All other components within normal limits    Radiology Dg Chest 2 View  Result Date: 11/30/2015 CLINICAL DATA:  Left chest wall pain EXAM: CHEST  2 VIEW COMPARISON:  11/24/2015 FINDINGS: Aortic valve replacement and CABG again noted. Heart size mildly enlarged.  Negative for heart failure Left lower lobe consolidation unchanged. Minimal right lower lobe airspace disease is improved. Probable small left effusion IMPRESSION: Left lower lobe consolidation unchanged. Mild right lower lobe airspace disease slightly improved. Electronically Signed   By: Franchot Gallo M.D.   On: 11/30/2015 12:33    Procedures Procedures (including critical care time)  Medications Ordered in ED Medications  metoCLOPramide (REGLAN) injection 10 mg (10 mg Intravenous Given 11/30/15 1252)  sodium chloride 0.9 % bolus 500 mL (0 mLs Intravenous Stopped 11/30/15 1359)  sodium chloride 0.9 % bolus 500 mL (0 mLs Intravenous Stopped 11/30/15 1519)     Initial Impression / Assessment and Plan / ED Course  I have reviewed the triage vital signs and the nursing notes.  Pertinent labs & imaging results that were available during my care of the patient were reviewed by me and considered in my medical decision making (see chart for details).  Clinical Course   Patient seen and examined. Discussed with and seen by Dr. Ashok Cordia.   Vital signs reviewed and are as follows: BP 132/73   Pulse 83   Resp 24   Ht 6' (1.829 m)   Wt 81.2 kg   SpO2 98%   BMI 24.28 kg/m    ED ECG REPORT   Date: 11/30/2015  Rate: 85  Rhythm: normal sinus rhythm  QRS Axis: normal  Intervals: normal  ST/T Wave abnormalities: nonspecific T wave changes  Conduction Disutrbances:none  Narrative Interpretation:   Old EKG Reviewed: unchanged  I have personally  reviewed the EKG tracing and agree with the computerized printout as noted.   3:56 PM Patient clinically improved. He is still lightheaded with standing. No further vomiting. Family is very concerned about returning home given recurrent vomiting without having a good understanding of what is causing his symptoms.   Discussed options with Dr. Tomi Bamberger. Will ask hospitalist for consultation.  4:51 PM Spoke with Dr. Carles Collet who will admit. Family updated and appreciative.    Final Clinical Impressions(s) / ED Diagnoses   Final diagnoses:  Nausea and vomiting, intractability of vomiting not specified, unspecified vomiting type  Acute cystitis without hematuria  Orthostatic hypotension   Admit for uncontrolled symptoms.   New Prescriptions New Prescriptions   No medications on file     Carlisle Cater, PA-C 11/30/15 [place], MD 12/03/15 1145

## 2015-11-30 NOTE — ED Notes (Signed)
Attempted to call report

## 2015-11-30 NOTE — ED Notes (Signed)
Pt ambulated in hall with use of walker and 1 stand by assist. Pt did not c/o of dizziness while walking; upon returning to the bed, this tech pt if he was feeling dizzy at all; pt stated "not that much"

## 2015-11-30 NOTE — H&P (Signed)
History and Physical  [name] DOB: May 19, 1945 DOA: 11/30/2015   PCP: Drema Pry, DO    Patient coming from: Home  Chief Complaint: recurrent vomiting  HPI:  [name] is a 70 y.o. male with medical history ofaortic stenosis, CAD, type II DM, essential hypertension, hyperlipidemia, recently discharged from the hospital on 11/08/15 status post 4 vessel CABG and AVR ([employer] pericardial tissue valve) for management of CAD and aortic stenosis. Postoperative course was complicated by PAF which was treated with amiodarone and Xarelto. The patient was also recently discharged on 11/20/2015 after having nine-day hospital stay for which he was treated for orthostatic hypotension, nausea and vomiting, and UTI. The patient was discharged home with amiodarone and rivaroxaban. In addition, Midodrine was started on 11/18/2015 and titrated up to 10 mg 3 times a day. In addition, the patient's ARB, diuretics, and beta blockers were discontinued due to his orthostasis.  Most recently, the patient had another admission from 11/24/2015 through 11/27/2015 secondary to recurrent nausea and vomiting and UTI. The patient was discharged home with levofloxacin. During that hospitalization, the patient had atrial fib with RVR, and his amiodarone dose was increased.  He presents with one-day history of nausea and vomiting that began on the evening of 11/29/2015 after he ate dinner. The patient had 2 additional episodes of emesis on the morning of 11/30/2015 which prompted him to come to the emergency department. He denies any new medications since discharge from the hospital. He denies any fevers, chills, chest pain, shortness breath, hematochezia, melena, diarrhea, abdominal pain. There is no dysuria or hematuria. He has been compliant with all his medications including antibiotics although he has not yet taken his antibiotics for today.  In the emergency department, the patient  was afebrile and hemodynamically stable with oxygen saturation 99-100 percent room air. WBC was 11.6 and serum creatinine was 1.08. Because of recurrent UTI and recurrent vomiting with dehydration, the patient was admitted for further evaluation.  Assessment/Plan: Intractable nausea and vomiting -Secondary to functional gastroparesis which has been likely induced from his multiple episodes of medical issues in the past 1-2 months including UTIs -concerned about pyelonephritis -Continue antiemetics -Clear liquids for now -His diabetes mellitus is poorly controlled with hemoglobin A1c 11.2 on 10/29/2015 which has also contributed to his functional gastroparesis -add PPI -add reglan if no improvement  UTI -pt has significant pyuria again to suggest UTI -this would be his third UTI in less than one month (each treated appropriately) -?pyelonephritis vs infected stone vs other GU anatomic abnormality -CT abdomen and pelvis -empiric ceftriaxone pending culture data -need longer treatement if pyelonephritis  Pulmonary Infiltrate -Not convinced the patient has pneumonia as he is not hypoxemic nor short of breath -Certainly, with the patient's recurrent vomiting he may have a component of aspiration pneumonitis -Procalcitonin   orthostatic hypotension - Due to nausea, vomiting and possibly DM dysautonomia -Thigh high TED hose  -Continue IV fluids  - He has been repeatedly counseled regarding measures to minimize his symptoms from orthostatic hypotension: Gradual transition from supine to sitting to standing and ambulating, adequate hydration, increasing salt intake, elevating legs up when he is sitting, where TED hose when ambulating. He verbalizes understanding.  Uncontrolled DM 2 - Hemoglobin A1c 10/29/15: 11.2. Metformin held due to current acute illness. Placed on reduced dose Lantus and SSIin  the hospital.  Essential hypertension - Controlled. Orthostatic changes.  -continue TED  hose -BP stable off anti-HTN  Paroxysmal A.  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  - LIMA to LAD, SVG to first and second OM, SVG to RCA.        Past Medical History:  Diagnosis Date  . 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 Carpal tunnel syndrome 09/24/2014   Bilateral  . 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 Diabetes mellitus, type 2 ([place])   . Erectile dysfunction   . Essential hypertension   . Hyperlipidemia    Past Surgical History:  Procedure Laterality Date  . AORTIC VALVE REPLACEMENT N/A 10/29/2015   Procedure: AORTIC VALVE REPLACEMENT (AVR) WITH 23MM MAGNA EASE TISSUE VALVE.;  Surgeon: [name], MD;  Location: [place];  Service: Open Heart Surgery;  Laterality: N/A;  . CARDIAC CATHETERIZATION N/A 10/27/2015   Procedure: Left Heart Cath and Coronary Angiography;  Surgeon: [name], MD;  Location: [facility];  Service: Cardiovascular;  Laterality: N/A;  . CORONARY ARTERY BYPASS GRAFT N/A 10/29/2015   Procedure: CORONARY ARTERY BYPASS GRAFTING (CABG) x Four UTILIZING THE LEFT INTERNAL MAMMARY ARTERY AND ENDOSCOPICALLY HARVESTED RIGHT SAPEHENEOUS VEINS.;  Surgeon: [name], MD;  Location: [place] [place];  Service: Open Heart Surgery;  Laterality: N/A;  . CYST REMOVAL HAND Right   . REPLACEMENT ASCENDING AORTA N/A 10/29/2015   Procedure: REPLACEMENT OF ASCENDING AORTA USING 34MM X 30CM WOVEN DOUBLE VELOUR VASCULAR GRAFT.;  Surgeon: [name], MD;  Location: [place];  Service: Open Heart Surgery;  Laterality: N/A;  . TEE WITHOUT CARDIOVERSION N/A 10/29/2015   Procedure: TRANSESOPHAGEAL ECHOCARDIOGRAM (TEE);   Surgeon: [name], MD;  Location: [place];  Service: Open Heart Surgery;  Laterality: N/A;   Social History:  reports that he has never smoked. He has never used smokeless tobacco. He reports that he does not drink alcohol or use drugs.   Family History  Problem Relation Age of Onset  . Cancer Father     Throat cancer  . Diabetes Sister      Allergies  Allergen Reactions  . No Known Allergies      Prior to Admission medications   Medication Sig Start Date End Date Taking? Authorizing Provider  amiodarone (PACERONE) 200 MG tablet Take 1 tablet (200 mg total) by mouth daily. Patient taking differently: Take 200 mg by mouth 2 (two) times daily.  11/23/15  Yes Thayer Headings, MD  atorvastatin (LIPITOR) 40 MG tablet Take 1 tablet (40 mg total) by mouth daily. 07/29/15  Yes Thayer Headings, MD  famotidine (PEPCID AC) 10 MG chewable tablet Chew 1 tablet (10 mg total) by mouth 2 (two) times daily as needed for heartburn. 10/11/15  Yes Thayer Headings, MD  ferrous gluconate (FERGON) 324 MG tablet Take 1 tablet (324 mg total) by mouth 2 (two) times daily with a meal. 11/08/15  Yes Wayne E Gold, PA-C  folic acid (FOLVITE) 1 MG tablet Take 1 tablet (1 mg total) by mouth daily. 11/08/15  Yes Wayne E Gold, PA-C  Insulin Glargine (LANTUS SOLOSTAR) 100 UNIT/ML Solostar Pen Inject 20 Units into the skin daily at 10 pm. 11/27/15  Yes Reyne Dumas, MD  insulin lispro (  HUMALOG KWIKPEN) 100 UNIT/ML KiwkPen Use 5 to 10 units three times daily 10-15 minutes before meals Patient taking differently: Inject 5-10 Units into the skin 3 (three) times daily before meals.  09/09/14  Yes Doe-Hyun R Shawna Orleans, DO  levofloxacin (LEVAQUIN) 750 MG tablet Take 1 tablet (750 mg total) by mouth daily. 11/28/15 12/03/15 Yes Reyne Dumas, MD  metFORMIN (GLUCOPHAGE) 1000 MG tablet Take 1 tablet (1,000 mg total) by mouth 2 (two) times daily with a meal. 09/07/15  Yes [name], NP  midodrine (PROAMATINE) 10 MG tablet Take 1  tablet (10 mg total) by mouth 3 (three) times daily with meals. 11/20/15  Yes Modena Jansky, MD  ondansetron (ZOFRAN ODT) 4 MG disintegrating tablet Take 1 tablet (4 mg total) by mouth every 8 (eight) hours as needed for nausea or vomiting. 11/11/15  Yes [name], MD  rivaroxaban (XARELTO) 20 MG TABS tablet Take 1 tablet (20 mg total) by mouth daily with supper. 11/08/15  Yes Erin R [name], PA-C  saccharomyces boulardii (FLORASTOR) 250 MG capsule Take 1 capsule (250 mg total) by mouth 2 (two) times daily. 11/27/15 12/07/15 Yes Reyne Dumas, MD  B-D ULTRAFINE III SHORT PEN 31G X 8 MM MISC USE 4 TIMES DAILY AS DIRECTED 11/30/15   [name], NP  glucose blood test strip Use as instructed 12/25/14   [name], NP  oxyCODONE (OXY IR/ROXICODONE) 5 MG immediate release tablet Take 1-2 tablets (5-10 mg total) by mouth every 6 (six) hours as needed for severe pain. 11/08/15   John Giovanni, PA-C    Review of Systems:  Constitutional:  No weight loss, night sweats, Fevers, chills, fatigue.  Head&Eyes: No headache.  No vision loss.  No eye pain or scotoma ENT:  No Difficulty swallowing,Tooth/dental problems,Sore throat,  No ear ache, post nasal drip,  Cardio-vascular:  No chest pain, Orthopnea, PND, swelling in lower extremities,  dizziness, palpitations  GI:  No  abdominal pain,  diarrhea, loss of appetite, hematochezia, melena, heartburn, indigestion, Resp:  No shortness of breath with exertion or at rest. No cough. No coughing up of blood .No wheezing.No chest wall deformity  Skin:  no rash or lesions.  GU:  no dysuria, change in color of urine, no urgency or frequency. No flank pain.  Musculoskeletal:  No joint pain or swelling. No decreased range of motion. No back pain.  Psych:  No change in mood or affect. No depression or anxiety. Neurologic: No headache, no dysesthesia, no focal weakness, no vision loss. No syncope  Physical Exam: Vitals:   11/30/15 1600 11/30/15  1615 11/30/15 1630 11/30/15 1645  BP: 133/71  129/67   Pulse: 87 86 89 88  Resp: 22 17 20 18   SpO2: 98% 96% 97% 96%  Weight:      Height:       General:  A&O x 3, NAD, nontoxic, pleasant/cooperative Head/Eye: No conjunctival hemorrhage, no icterus, [place]/AT, No nystagmus ENT:  No icterus,  No thrush, good dentition, no pharyngeal exudate Neck:  No masses, no lymphadenpathy, no bruits CV:  RRR, no rub, no gallop, no S3 Lung:  CTAB, good air movement, no wheeze, no rhonchi Abdomen: soft/NT, +BS, nondistended, no peritoneal signs Ext: No cyanosis, No rashes, No petechiae, No lymphangitis, No edema Neuro: CNII-XII intact, strength 4/5 in bilateral upper and lower extremities, no dysmetria  Labs on Admission:  Basic Metabolic Panel:  Recent Labs Lab 11/24/15 1227 11/24/15 1233 11/25/15 0216 11/26/15 0609 11/30/15 1127  NA 135  --  136 138 137  K 5.1  --  4.1 3.9 4.4  CL 100*  --  102 103 100*  CO2 25  --  26 28 27   GLUCOSE 144*  --  124* 113* 155*  BUN 11  --  8 <5* 5*  CREATININE 1.55*  --  1.03 1.10 1.08  CALCIUM 9.1  --  8.6* 8.8* 9.8  MG  --  1.8 1.7  --   --    Liver Function Tests:  Recent Labs Lab 11/24/15 1227 11/26/15 0609 11/30/15 1127  AST 22 24 21   ALT 28 23 18   ALKPHOS 84 75 82  BILITOT 1.1 0.6 0.5  PROT 7.3 6.7 7.9  ALBUMIN 3.2* 2.9* 3.4*    Recent Labs Lab 11/24/15 1227  LIPASE 29   No results for input(s): AMMONIA in the last 168 hours. CBC:  Recent Labs Lab 11/24/15 1233 11/25/15 0216 11/26/15 0609 11/30/15 1127  WBC 15.8* 11.5* 9.0 11.6*  NEUTROABS  --   --   --  8.7*  HGB 11.1* 10.1* 10.1* 11.2*  HCT 33.8* 31.2* 31.5* 34.7*  MCV 87.3 87.2 87.3 87.2  PLT 367 334 326 342   Coagulation Profile:  Recent Labs Lab 11/24/15 1233  INR 2.52   Cardiac Enzymes: No results for input(s): CKTOTAL, CKMB, CKMBINDEX, TROPONINI in the last 168 hours. BNP: Invalid input(s): POCBNP CBG:  Recent Labs Lab 11/26/15 1136 11/26/15 1657  11/26/15 2153 11/27/15 0752 11/27/15 1218  GLUCAP 215* 132* 147* 122* 178*   Urine analysis:    Component Value Date/Time   COLORURINE YELLOW 11/30/2015 1250   APPEARANCEUR HAZY (A) 11/30/2015 1250   LABSPEC 1.011 11/30/2015 1250   PHURINE 6.5 11/30/2015 1250   GLUCOSEU NEGATIVE 11/30/2015 1250   GLUCOSEU NEGATIVE 04/06/2010 1554   HGBUR TRACE (A) 11/30/2015 1250   BILIRUBINUR NEGATIVE 11/30/2015 1250   BILIRUBINUR Negative 04/03/2014 1642   KETONESUR NEGATIVE 11/30/2015 1250   PROTEINUR 30 (A) 11/30/2015 1250   UROBILINOGEN 1.0 04/03/2014 1642   UROBILINOGEN 1.0 04/03/2014 0058   NITRITE NEGATIVE 11/30/2015 1250   LEUKOCYTESUR LARGE (A) 11/30/2015 1250   Sepsis Labs: @LABRCNTIP (procalcitonin:4,lacticidven:4) ) Recent Results (from the past 240 hour(s))  Urine culture     Status: Abnormal   Collection Time: 11/24/15  3:51 PM  Result Value Ref Range Status   Specimen Description URINE, RANDOM  Final   Special Requests NONE  Final   Culture >=100,000 COLONIES/mL KLEBSIELLA PNEUMONIAE (A)  Final   Report Status 11/27/2015 FINAL  Final   Organism ID, Bacteria KLEBSIELLA PNEUMONIAE (A)  Final      Susceptibility   Klebsiella pneumoniae - MIC*    AMPICILLIN >=32 RESISTANT Resistant     CEFAZOLIN <=4 SENSITIVE Sensitive     CEFTRIAXONE <=1 SENSITIVE Sensitive     CIPROFLOXACIN <=0.25 SENSITIVE Sensitive     GENTAMICIN <=1 SENSITIVE Sensitive     IMIPENEM <=0.25 SENSITIVE Sensitive     NITROFURANTOIN 32 SENSITIVE Sensitive     TRIMETH/SULFA <=20 SENSITIVE Sensitive     AMPICILLIN/SULBACTAM 4 SENSITIVE Sensitive     PIP/TAZO <=4 SENSITIVE Sensitive     Extended ESBL NEGATIVE Sensitive     * >=100,000 COLONIES/mL KLEBSIELLA PNEUMONIAE  Culture, blood (Routine X 2) w Reflex to ID Panel     Status: None   Collection Time: 11/24/15  4:30 PM  Result Value Ref Range Status   Specimen Description BLOOD RIGHT ANTECUBITAL  Final   Special Requests BOTTLES DRAWN AEROBIC AND  ANAEROBIC  5CC  Final   Culture NO GROWTH 5 DAYS  Final   Report Status 11/29/2015 FINAL  Final  Culture, blood (Routine X 2) w Reflex to ID Panel     Status: None   Collection Time: 11/24/15  4:58 PM  Result Value Ref Range Status   Specimen Description BLOOD LEFT ARM  Final   Special Requests BOTTLES DRAWN AEROBIC AND ANAEROBIC 5CC  Final   Culture NO GROWTH 5 DAYS  Final   Report Status 11/29/2015 FINAL  Final  C difficile quick scan w PCR reflex     Status: None   Collection Time: 11/25/15  6:18 PM  Result Value Ref Range Status   C Diff antigen NEGATIVE NEGATIVE Final   C Diff toxin NEGATIVE NEGATIVE Final   C Diff interpretation No C. difficile detected.  Final  Clostridium Difficile by PCR     Status: None   Collection Time: 11/25/15  7:41 PM  Result Value Ref Range Status   Toxigenic C Difficile by pcr NEGATIVE NEGATIVE Final    Comment: Patient is colonized with non toxigenic C. difficile. May not need treatment unless significant symptoms are present.     Radiological Exams on Admission: Dg Chest 2 View  Result Date: 11/30/2015 CLINICAL DATA:  Left chest wall pain EXAM: CHEST  2 VIEW COMPARISON:  11/24/2015 FINDINGS: Aortic valve replacement and CABG again noted. Heart size mildly enlarged. Negative for heart failure Left lower lobe consolidation unchanged. Minimal right lower lobe airspace disease is improved. Probable small left effusion IMPRESSION: Left lower lobe consolidation unchanged. Mild right lower lobe airspace disease slightly improved. Electronically Signed   By: Franchot Gallo M.D.   On: 11/30/2015 12:33    EKG: Independently reviewed. Sinus rhythm, nonspecific T-wave changes.    Time spent:70 minutes Code Status:   FULL Family Communication:  Wife updated at bedside Disposition Plan: expect 2 day hospitalization Consults called: none DVT Prophylaxis:  rivaroxaban  Tekeya Geffert, DO  [facility] Pager [phone]  If 7PM-7AM, please contact  night-coverage [email or web address] Password [email or web address] 11/30/2015, 5:19 PM

## 2015-11-30 NOTE — ED Notes (Signed)
Pt states he has Chest soreness at left breast with movement. NO pain after repositioned but c/o continued tenderness and some very minimal swelling at left breast.

## 2015-11-30 NOTE — ED Notes (Signed)
MD at bedside. 

## 2015-11-30 NOTE — ED Notes (Addendum)
Patient transported to X-ray 

## 2015-12-01 ENCOUNTER — Ambulatory Visit: Payer: Self-pay | Admitting: Adult Health

## 2015-12-01 ENCOUNTER — Inpatient Hospital Stay (HOSPITAL_COMMUNITY): Payer: Medicare Other

## 2015-12-01 DIAGNOSIS — R112 Nausea with vomiting, unspecified: Secondary | ICD-10-CM

## 2015-12-01 DIAGNOSIS — N3000 Acute cystitis without hematuria: Secondary | ICD-10-CM

## 2015-12-01 DIAGNOSIS — N390 Urinary tract infection, site not specified: Secondary | ICD-10-CM

## 2015-12-01 DIAGNOSIS — Z0289 Encounter for other administrative examinations: Secondary | ICD-10-CM

## 2015-12-01 DIAGNOSIS — I25810 Atherosclerosis of coronary artery bypass graft(s) without angina pectoris: Secondary | ICD-10-CM

## 2015-12-01 LAB — CBC
HEMATOCRIT: 30.5 % — AB (ref 39.0–52.0)
HEMOGLOBIN: 10 g/dL — AB (ref 13.0–17.0)
MCH: 28.4 pg (ref 26.0–34.0)
MCHC: 32.8 g/dL (ref 30.0–36.0)
MCV: 86.6 fL (ref 78.0–100.0)
Platelets: 305 10*3/uL (ref 150–400)
RBC: 3.52 MIL/uL — ABNORMAL LOW (ref 4.22–5.81)
RDW: 15.2 % (ref 11.5–15.5)
WBC: 8.7 10*3/uL (ref 4.0–10.5)

## 2015-12-01 LAB — GLUCOSE, CAPILLARY
Glucose-Capillary: 122 mg/dL — ABNORMAL HIGH (ref 65–99)
Glucose-Capillary: 117 mg/dL — ABNORMAL HIGH (ref 65–99)
Glucose-Capillary: 154 mg/dL — ABNORMAL HIGH (ref 65–99)
Glucose-Capillary: 137 mg/dL — ABNORMAL HIGH (ref 65–99)

## 2015-12-01 LAB — BASIC METABOLIC PANEL
ANION GAP: 8 (ref 5–15)
BUN: 5 mg/dL — AB (ref 6–20)
CALCIUM: 9.4 mg/dL (ref 8.9–10.3)
CO2: 26 mmol/L (ref 22–32)
Chloride: 104 mmol/L (ref 101–111)
Creatinine, Ser: 0.96 mg/dL (ref 0.61–1.24)
GFR calc Af Amer: >60 mL/min (ref >60)
GFR calc non Af Amer: >60 mL/min (ref >60)
GLUCOSE: 124 mg/dL — AB (ref 65–99)
POTASSIUM: 4.4 mmol/L (ref 3.5–5.1)
Sodium: 138 mmol/L (ref 135–145)

## 2015-12-01 MED ORDER — IOPAMIDOL (ISOVUE-300) INJECTION 61%
INTRAVENOUS | Status: AC
  Filled 2015-12-01: qty 30

## 2015-12-01 MED ORDER — ONDANSETRON HCL 4 MG/2ML IJ SOLN: 4.0000 mg | Freq: Four times a day (QID) | INTRAMUSCULAR | Status: DC | PRN

## 2015-12-01 MED ORDER — METOCLOPRAMIDE HCL 5 MG/ML IJ SOLN: 5.0000 mg | Freq: Three times a day (TID) | INTRAMUSCULAR | Status: DC

## 2015-12-01 MED ORDER — COSYNTROPIN 0.25 MG IJ SOLR
0.2500 mg | Freq: Once | INTRAMUSCULAR | Status: AC
  Administered 2015-12-02: 0.25 mg via INTRAVENOUS
  Filled 2015-12-01 (×2): qty 0.25

## 2015-12-01 MED ORDER — PANTOPRAZOLE SODIUM 40 MG PO TBEC
40.0000 mg | DELAYED_RELEASE_TABLET | Freq: Every day | ORAL | Status: DC
  Administered 2015-12-02: 40 mg via ORAL
  Filled 2015-12-01: qty 1

## 2015-12-01 NOTE — Consult Note (Signed)
Referring Provider: Dr. Sloan Leiter Primary Care Physician:  [name], NP Primary Gastroenterologist:  Dr. Hilarie Fredrickson  Reason for Consultation:  Nausea and vomiting  HPI: [name] is a 70 y.o. male with medical history of aortic stenosis, CAD, type II DM, essential hypertension, hyperlipidemia.  Recently discharged from the hospital on 11/08/15 status post 4 vessel CABG and AVR ([employer] pericardial tissue valve) for management of CAD and aortic stenosis. Postoperative course was complicated by PAF, which was treated with amiodarone and Xarelto.The patient was also recently discharged on 11/20/2015 after having nine-day hospital stay for which he was treated for orthostatic hypotension, nausea and vomiting, and UTI. The patient was discharged home with amiodarone and rivaroxaban. In addition, Midodrinewas started on 11/18/2015 and titrated up to 10 mg 3 times a day. In addition, the patient's ARB, diuretics, and beta blockers were discontinued due to his orthostasis.  Most recently, the patient had another admission from 11/24/2015 through 11/27/2015 secondary to recurrent nausea and vomiting and UTI. The patient was discharged home with levofloxacin. During that hospitalization, the patient had atrial fib with RVR, and his amiodarone dose was increased.  He presents again now with one-day history of nausea and vomiting that began on the evening of 11/29/2015 after he ate dinner. The patient had 2 additional episodes of emesis on the morning of 11/30/2015, which prompted him to come to the emergency department.  He again appears to have a UTI.  In the emergency department, the patient was afebrile and hemodynamically stable with oxygen saturation 99-100 percent room air. WBC was 11.6 and serum creatinine was 1.08. Because of recurrent UTI and recurrent vomiting with dehydration, the patient was admitted for further evaluation.  CT scan abdomen and pelvis without contrast showed  the following:  IMPRESSION: 1. Urinary bladder diffuse wall thickening may reflect cystitis. 2. Nonobstructive 3 mm left kidney lower pole nonobstructive calculus. 3. Left lower lobe airspace opacity partially from atelectasis but partially from airspace filling process, pneumonia not excluded. Small left pleural effusion. 4. Postoperative findings including recent median sternotomy and small pericardial effusion. Two of the 3 visualized lower sternotomy wires only capture the edge of the left side of the sternotomy; this is probably incidental and dehiscence is considered unlikely, but correlate with any symptoms directly related to the sternum. 5. Dependent density in the gallbladder from sludge or gallstones. 6. Mild prominence of stool in the rectum, probably incidental. 7.  Aortoiliac atherosclerotic vascular disease. 8. Lumbar spondylosis and degenerative disc disease causing foraminal impingement at L3-4, L4-5, and L5-S1 (assumes transitional S1 vertebra). 9. Direct left inguinal hernia contains adipose tissue. 10. Degenerative arthropathy of both hips.  He's also had a CT scan of the head without contrast, which was unremarkable.  Last Hgb A1C in September was 11.2.  He is not on an acid medication regularly at home.  No NSAID's.  Says that the vomiting sometimes occurs just as his is starting to eat and sometimes wakes up from sleep and starts vomiting.  Denies any other GI complaints.    Past Medical History:  Diagnosis Date  . 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 Carpal tunnel syndrome 09/24/2014   Bilateral  . 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 Diabetes mellitus, type 2 ([place])   . Erectile dysfunction   . Essential hypertension   . Hyperlipidemia     Past Surgical History:  Procedure Laterality Date  .  AORTIC VALVE REPLACEMENT N/A 10/29/2015   Procedure: AORTIC VALVE REPLACEMENT (AVR) WITH 23MM MAGNA EASE  TISSUE VALVE.;  Surgeon: [name], MD;  Location: [place];  Service: Open Heart Surgery;  Laterality: N/A;  . CARDIAC CATHETERIZATION N/A 10/27/2015   Procedure: Left Heart Cath and Coronary Angiography;  Surgeon: [name], MD;  Location: [facility];  Service: Cardiovascular;  Laterality: N/A;  . CORONARY ARTERY BYPASS GRAFT N/A 10/29/2015   Procedure: CORONARY ARTERY BYPASS GRAFTING (CABG) x Four UTILIZING THE LEFT INTERNAL MAMMARY ARTERY AND ENDOSCOPICALLY HARVESTED RIGHT SAPEHENEOUS VEINS.;  Surgeon: [name], MD;  Location: [facility];  Service: Open Heart Surgery;  Laterality: N/A;  . CYST REMOVAL HAND Right   . REPLACEMENT ASCENDING AORTA N/A 10/29/2015   Procedure: REPLACEMENT OF ASCENDING AORTA USING 34MM X 30CM WOVEN DOUBLE VELOUR VASCULAR GRAFT.;  Surgeon: [name], MD;  Location: [facility];  Service: Open Heart Surgery;  Laterality: N/A;  . TEE WITHOUT CARDIOVERSION N/A 10/29/2015   Procedure: TRANSESOPHAGEAL ECHOCARDIOGRAM (TEE);  Surgeon: [name], MD;  Location: [place];  Service: Open Heart Surgery;  Laterality: N/A;    Prior to Admission medications   Medication Sig Start Date End Date Taking? Authorizing Provider  amiodarone (PACERONE) 200 MG tablet Take 1 tablet (200 mg total) by mouth daily. Patient taking differently: Take 200 mg by mouth 2 (two) times daily.  11/23/15  Yes Thayer Headings, MD  atorvastatin (LIPITOR) 40 MG tablet Take 1 tablet (40 mg total) by mouth daily. 07/29/15  Yes Thayer Headings, MD  famotidine (PEPCID AC) 10 MG chewable tablet Chew 1 tablet (10 mg total) by mouth 2 (two) times daily as needed for heartburn. 10/11/15  Yes Thayer Headings, MD  ferrous gluconate (FERGON) 324 MG tablet Take 1 tablet (324 mg total) by mouth 2 (two) times daily with a meal. 11/08/15  Yes Wayne E Gold, PA-C  folic acid (FOLVITE) 1 MG tablet Take 1 tablet (1 mg total) by mouth daily. 11/08/15  Yes Wayne E Gold, PA-C  Insulin Glargine (LANTUS  SOLOSTAR) 100 UNIT/ML Solostar Pen Inject 20 Units into the skin daily at 10 pm. 11/27/15  Yes Reyne Dumas, MD  insulin lispro (HUMALOG KWIKPEN) 100 UNIT/ML KiwkPen Use 5 to 10 units three times daily 10-15 minutes before meals Patient taking differently: Inject 5-10 Units into the skin 3 (three) times daily before meals.  09/09/14  Yes Doe-Hyun R Shawna Orleans, DO  levofloxacin (LEVAQUIN) 750 MG tablet Take 1 tablet (750 mg total) by mouth daily. 11/28/15 12/03/15 Yes Reyne Dumas, MD  metFORMIN (GLUCOPHAGE) 1000 MG tablet Take 1 tablet (1,000 mg total) by mouth 2 (two) times daily with a meal. 09/07/15  Yes [name], NP  midodrine (PROAMATINE) 10 MG tablet Take 1 tablet (10 mg total) by mouth 3 (three) times daily with meals. 11/20/15  Yes Modena Jansky, MD  ondansetron (ZOFRAN ODT) 4 MG disintegrating tablet Take 1 tablet (4 mg total) by mouth every 8 (eight) hours as needed for nausea or vomiting. 11/11/15  Yes [name], MD  rivaroxaban (XARELTO) 20 MG TABS tablet Take 1 tablet (20 mg total) by mouth daily with supper. 11/08/15  Yes Erin R [name], PA-C  saccharomyces boulardii (FLORASTOR) 250 MG capsule Take 1 capsule (250 mg total) by mouth 2 (two) times daily. 11/27/15 12/07/15 Yes Reyne Dumas, MD  B-D ULTRAFINE III SHORT PEN 31G X 8 MM MISC USE 4 TIMES DAILY AS DIRECTED 11/30/15  [name], NP  glucose blood test strip Use as instructed 12/25/14   [name], NP  oxyCODONE (OXY IR/ROXICODONE) 5 MG immediate release tablet Take 1-2 tablets (5-10 mg total) by mouth every 6 (six) hours as needed for severe pain. 11/08/15   John Giovanni, PA-C    Current Facility-Administered Medications  Medication Dose Route Frequency Provider Last Rate Last Dose  . 0.9 % NaCl with KCl 20 mEq/ L  infusion   Intravenous Continuous Jonetta Osgood, MD 75 mL/hr at 12/01/15 1532    . acetaminophen (TYLENOL) tablet 650 mg  650 mg Oral Q6H PRN Orson Eva, MD       Or  . acetaminophen (TYLENOL)  suppository 650 mg  650 mg Rectal Q6H PRN Orson Eva, MD      . amiodarone (PACERONE) tablet 200 mg  200 mg Oral BID Orson Eva, MD   200 mg at 12/01/15 1315  . atorvastatin (LIPITOR) tablet 40 mg  40 mg Oral Daily Orson Eva, MD   40 mg at 12/01/15 1315  . cefTRIAXone (ROCEPHIN) 1 g in dextrose 5 % 50 mL IVPB  1 g Intravenous Q24H Orson Eva, MD   1 g at 11/30/15 2132  . [START ON 12/02/2015] cosyntropin (CORTROSYN) injection 0.25 mg  0.25 mg Intravenous Once Shanker Kristeen Mans, MD      . insulin aspart (novoLOG) injection 0-5 Units  0-5 Units Subcutaneous QHS David Tat, MD      . insulin aspart (novoLOG) injection 0-9 Units  0-9 Units Subcutaneous TID WC Orson Eva, MD   1 Units at 12/01/15 [phone]  . insulin glargine (LANTUS) injection 8 Units  8 Units Subcutaneous QHS Orson Eva, MD   8 Units at 11/30/15 2153  . iopamidol (ISOVUE-300) 61 % injection           . metoCLOPramide (REGLAN) injection 5 mg  5 mg Intravenous TID AC Shanker Kristeen Mans, MD      . midodrine (PROAMATINE) tablet 10 mg  10 mg Oral TID WC Orson Eva, MD      . ondansetron [facility]) tablet 4 mg  4 mg Oral Q6H PRN Orson Eva, MD       Or  . ondansetron (ZOFRAN) injection 4 mg  4 mg Intravenous Q6H PRN Orson Eva, MD      . Derrill Memo ON 12/02/2015] pantoprazole (PROTONIX) EC tablet 40 mg  40 mg Oral Q1200 Jonetta Osgood, MD      . rivaroxaban [facility]veda Reasons) tablet 20 mg  20 mg Oral Q supper Shanon Brow Tat, MD      . saccharomyces boulardii (FLORASTOR) capsule 250 mg  250 mg Oral BID Orson Eva, MD   250 mg at 12/01/15 1315  . sodium chloride flush (NS) 0.9 % injection 3 mL  3 mL Intravenous Q12H Orson Eva, MD   3 mL at 12/01/15 1214    Allergies as of 11/30/2015 - Review Complete 11/30/2015  Allergen Reaction Noted  . No known allergies  10/28/2015    Family History  Problem Relation Age of Onset  . Cancer Father     Throat cancer  . Diabetes Sister     Social History   Social History  . Marital status: Married    Spouse name: N/A  .  Number of children: N/A  . Years of education: N/A   Occupational History  . Not on file.   Social History Main Topics  . Smoking status: Never Smoker  . Smokeless tobacco: Never Used  .  Alcohol use No  . Drug use: No  . Sexual activity: Yes   Other Topics Concern  . Not on file   Social History Narrative   Married to [name] for 27 years   Retired as Chiropodist   5 children   Never Smoked   Alcohol use- no   Caffeine use: occasional     Review of Systems: Ten point ROS is O/W negative except as mentioned in HPI.  Physical Exam: Vital signs in last 24 hours: Temp:  [98.2 F (36.8 C)-99.7 F (37.6 C)] 98.2 F (36.8 C) (10/18 1000) Pulse Rate:  [77-89] 77 (10/18 1000) Resp:  [16-25] 20 (10/18 1000) BP: (120-137)/(62-74) 121/62 (10/18 1000) SpO2:  [96 %-99 %] 98 % (10/18 1000) Weight:  [185 lb 12.8 oz (84.3 kg)] 185 lb 12.8 oz (84.3 kg) (10/17 2044) Last BM Date: 11/29/15 General:  Alert, Well-developed, well-nourished, pleasant and cooperative in NAD Head:  Normocephalic and atraumatic. Eyes:  Sclera clear, no icterus.  Conjunctiva pink. Ears:  Normal auditory acuity. Mouth:  No deformity or lesions.   Lungs:  Clear throughout to auscultation.  No wheezes, crackles, or rhonchi. Heart:  Regular rate and rhythm; no murmurs, clicks, rubs, or gallops. Abdomen:  Soft, non-distended.  BS present.  Non-tender. Rectal:  Deferred  Msk:  Symmetrical without gross deformities. Pulses:  Normal pulses noted. Extremities:  Without clubbing or edema. Neurologic:  Alert and oriented x 4;  grossly normal neurologically. Skin:  Intact without significant lesions or rashes. Psych:  Alert and cooperative. Normal mood and affect.  Intake/Output from previous day: 10/17 0701 - 10/18 0700 In: 1986.3 [P.O.:180; I.V.:756.3; IV Piggyback:1050] Out: 600 [Urine:600] Intake/Output this shift: Total I/O In: 0  Out: 800 [Urine:800]  Lab Results:  Recent Labs   11/30/15 1127 12/01/15 0750  WBC 11.6* 8.7  HGB 11.2* 10.0*  HCT 34.7* 30.5*  PLT 342 305   BMET  Recent Labs  11/30/15 1127 12/01/15 0750  NA 137 138  K 4.4 4.4  CL 100* 104  CO2 27 26  GLUCOSE 155* 124*  BUN 5* 5*  CREATININE 1.08 0.96  CALCIUM 9.8 9.4   LFT  Recent Labs  11/30/15 1127  PROT 7.9  ALBUMIN 3.4*  AST 21  ALT 18  ALKPHOS 82  BILITOT 0.5   Studies/Results: Ct Abdomen Pelvis Wo Contrast  Result Date: 12/01/2015 CLINICAL DATA:  Chronic nausea and vomiting. Prior CABG last month. Recurrent urinary tract infection. EXAM: CT ABDOMEN AND PELVIS WITHOUT CONTRAST TECHNIQUE: Multidetector CT imaging of the abdomen and pelvis was performed following the standard protocol without IV contrast. COMPARISON:  Abdominal radiograph of 11/14/2015 FINDINGS: Lower chest: Left lower lobe airspace opacity primarily from atelectasis but possibly with superimposed airspace filling process such as pneumonia. Small left pleural effusion, nonspecific for transudative or exudative etiology. Mild atelectasis in the right lower lobe. Small pericardial effusion. Aortic valve prosthesis. Coronary artery atherosclerotic calcification and prior median sternotomy. The sternotomy is still visible with about 3 mm of bony separation at the lower sternotomy site. Several of the lower sternotomy wires capture only the very margin of the left side of the sternotomy. Hepatobiliary: Dependent density in the gallbladder otherwise unremarkable. Pancreas: Unusual pancreatic contour which is smooth but thin, with little stippling of the pancreatic parenchyma ; appearance not changed from 08/27/2015. Spleen: Unremarkable Adrenals/Urinary Tract: 3 mm left kidney lower pole nonobstructive calculus. No hydronephrosis. Thick-walled urinary bladder diffusely. Stomach/Bowel: Mild prominence of stool in the rectum. Orally administered  contrast extends down through to the rectum. Appendix normal. Vascular/Lymphatic:  Aortoiliac atherosclerotic vascular disease. No pathologic adenopathy identified. Reproductive: Unremarkable Other: Unremarkable Musculoskeletal: A transitional lumbosacral vertebra is assumed to represent the S1 level. Careful correlation with this numbering strategy prior to any procedural intervention would be recommended. Lumbar spondylosis and degenerative disc disease cause bilateral foraminal stenosis at L3-4, L4-5, and L5-S1. Direct left inguinal hernia contains adipose tissue. There is degenerative arthropathy of both hips. IMPRESSION: 1. Urinary bladder diffuse wall thickening may reflect cystitis. 2. Nonobstructive 3 mm left kidney lower pole nonobstructive calculus. 3. Left lower lobe airspace opacity partially from atelectasis but partially from airspace filling process, pneumonia not excluded. Small left pleural effusion. 4. Postoperative findings including recent median sternotomy and small pericardial effusion. Two of the 3 visualized lower sternotomy wires only capture the edge of the left side of the sternotomy ; this is probably incidental and dehiscence is considered unlikely, but correlate with any symptoms directly related to the sternum. 5. Dependent density in the gallbladder from sludge or gallstones. 6. Mild prominence of stool in the rectum, probably incidental. 7.  Aortoiliac atherosclerotic vascular disease. 8. Lumbar spondylosis and degenerative disc disease causing foraminal impingement at L3-4, L4-5, and L5-S1 (assumes transitional S1 vertebra). 9. Direct left inguinal hernia contains adipose tissue. 10. Degenerative arthropathy of both hips. Electronically Signed   By: Van Clines M.D.   On: 12/01/2015 14:07   Dg Chest 2 View  Result Date: 11/30/2015 CLINICAL DATA:  Left chest wall pain EXAM: CHEST  2 VIEW COMPARISON:  11/24/2015 FINDINGS: Aortic valve replacement and CABG again noted. Heart size mildly enlarged. Negative for heart failure Left lower lobe consolidation  unchanged. Minimal right lower lobe airspace disease is improved. Probable small left effusion IMPRESSION: Left lower lobe consolidation unchanged. Mild right lower lobe airspace disease slightly improved. Electronically Signed   By: Franchot Gallo M.D.   On: 11/30/2015 12:33   IMPRESSION:  -Recurrent nausea and vomiting:  Since cardiac surgery in September.  ? Etiology.  Llikely has underlying gastroparesis from his uncontrolled DM that has been worsened by recent surgery.  ? If this is related to his recurrent UTI's.  ? If side effect of new med (amiodarone or Xarelto).  ? Ulcer or acid related.  CT scan head as well as abd/pelvis negative for source.   -Paroxysmal atrial fibrillation:  CHADSVASC = 4.  On amiodarone and Xarelto. -S/P bioprosthetic AVR, aortic root replacement & CABG (10/2015) -Type 2 DM:  Poorly controlled with A1C 11.2 one month ago -UTI:  Recurrent.  Currently on treatment.  PLAN: -Will hold Xarelto today and proceed with EGD tomorrow, 10/18 to rule out ulcer, etc. -Will discontinue Reglan for now in case he would need gastric emptying scan.  Will change to zofran 4 mg IV ATC every 6 hours. -Continue pantoprazole 40 mg daily.  Michael Ventresca D.  12/01/2015, 3:57 PM  Pager number [phone]

## 2015-12-01 NOTE — Progress Notes (Addendum)
PROGRESS NOTE        PATIENT DETAILS Name: [name] Age: 70 y.o. Sex: male Date of Birth: 1945/06/14 Admit Date: 11/30/2015 Admitting Physician Orson Eva, MD [id number] Nafziger, NP  Brief Narrative: Patient is a 70 y.o. male with recent history of 4 vessel CABG and bioprosthetic aortic valve replacement in September 2015, following which he has had 2 hospitalizations for vomiting, orthostatic hypotension and UTI-admitted again on 10/17 for recurrent vomiting.  Subjective: No vomiting today. Continues to feel dizzy on standing  No chest pain No shortness of breath  Assessment/Plan: Active Problems: Vomiting:? Etiology-could have underlying gastroparesis. No obvious gastric outlet obstruction seen on CT scan of the abdomen. Doubt related to possible UTI at this point. This is a recurrent issue-third admission alone this month-probably will require a inpatient endoscopy, will consult GI. Change Reglan to scheduled dosing. If workup continues to be negative, may need a HIDA scan with EF (sludge/small gallstones seen on CT). Check am cortisol.  Recurrent UTI: Does acknowledge frequency of urination-denies dysuria. Appears to have recurrent pyuria on repeat urinalyses. Continue Rocephin, await culture. CT of the abdomen without any major structural abnormality.  Pulmonary infiltrate: Doubt pneumonia-no symptoms. Furthermore pro-calcitonin essentially negative. On Rocephin for above which should cover in any event.  Orthostatic hypotension: Apparently developed post CABG-off all antihypertensives. Continue Midodrine-Check cortisol levels.Have counseled regarding measures to minimize his symptoms from orthostatic hypotension: Gradual transition from supine to sitting to standing and ambulating, adequate hydration, increasing salt intake, elevating legs up when he is sitting, where TED hose when ambulating. He verbalizes understanding.  Type 2 diabetes: CBGs  relatively stable, continue Lantus and SSI.  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  MEDICATIONS: Anti-infectives    Start     Dose/Rate Route Frequency Ordered Stop   11/30/15 2015  cefTRIAXone (ROCEPHIN) 1 g in dextrose 5 % 50 mL IVPB     1 g 100 mL/hr over 30 Minutes Intravenous Every 24 hours 11/30/15 2015        Scheduled Meds: . amiodarone  200 mg Oral BID  . atorvastatin  40 mg Oral Daily  . cefTRIAXone (ROCEPHIN)  IV  1 g Intravenous Q24H  . insulin aspart  0-5 Units Subcutaneous QHS  . insulin aspart  0-9 Units Subcutaneous TID WC  . insulin glargine  8 Units Subcutaneous QHS  . iopamidol      . midodrine  10 mg Oral TID WC  . rivaroxaban  20 mg Oral Q supper  . saccharomyces boulardii  250 mg Oral BID  . sodium chloride flush  3 mL Intravenous Q12H   Continuous Infusions: . 0.9 % NaCl with KCl 20 mEq / L 75 mL/hr at 11/30/15 2055   PRN Meds:.acetaminophen **OR** acetaminophen, famotidine, metoCLOPramide (REGLAN) injection, ondansetron **OR** ondansetron (ZOFRAN) IV   PHYSICAL EXAM: Vital signs: Vitals:   11/30/15 1915 11/30/15 1930  11/30/15 2044 12/01/15 1000  BP:  137/74 127/68 121/62  Pulse: 87 82 82 77  Resp: 17 22 20 20   Temp:   99.7 F (37.6 C) 98.2 F (36.8 C)  TempSrc:   Oral Oral  SpO2: 97% 98% 97% 98%  Weight:   84.3 kg (185 lb 12.8 oz)   Height:       Filed Weights   11/30/15 1032 11/30/15 2044    Weight: 81.2 kg (179 lb) 84.3 kg (185 lb 12.8 oz)   Body mass index is 25.2 kg/m.   General appearance :Awake, alert, not in any distress. Speech Clear.Chronically sick appearing Eyes:, pupils equally reactive to light and accomodation,no scleral icterus.Pink conjunctiva HEENT: Atraumatic and Normocephalic Neck: supple, no JVD. No cervical lymphadenopathy. No thyromegaly Resp:Good air entry bilaterally, no added sounds  CVS: S1 S2 regular, no murmurs.  GI: Bowel sounds present, Non tender and not distended with no gaurding, rigidity or rebound.No organomegaly Extremities: B/L Lower Ext shows no edema, both legs are warm to touch Neurology:  speech clear,Non focal, sensation is grossly intact. Psychiatric: Normal judgment and insight. Alert and oriented x 3. Normal mood. Musculoskeletal:gait appears to be normal.No digital cyanosis Skin:No Rash, warm and dry Wounds:N/A  I have personally reviewed following labs and imaging studies  LABORATORY DATA: CBC:  Recent Labs Lab 11/25/15 0216 11/26/15 0609 11/30/15 1127 12/01/15 0750  WBC 11.5* 9.0 11.6* 8.7  NEUTROABS  --   --  8.7*  --   HGB 10.1* 10.1* 11.2* 10.0*  HCT 31.2* 31.5* 34.7* 30.5*  MCV 87.2 87.3 87.2 86.6  PLT 334 326 342 [id number]    Basic Metabolic Panel:  Recent Labs Lab 11/25/15 0216 11/26/15 0609 11/30/15 1127 12/01/15 0750  NA 136 138 137 138  K 4.1 3.9 4.4 4.4  CL 102 103 100* 104  CO2 26 28 27 26   GLUCOSE 124* 113* 155* 124*  BUN 8 <5* 5* 5*  CREATININE 1.03 1.10 1.08 0.96  CALCIUM 8.6* 8.8* 9.8 9.4  MG 1.7  --   --   --     GFR: Estimated Creatinine Clearance: 80.8 mL/min (by C-G formula based on SCr of 0.96 mg/dL).  Liver Function Tests:  Recent Labs Lab 11/26/15 0609 11/30/15 1127  AST 24 21  ALT 23 18  ALKPHOS 75 82  BILITOT 0.6 0.5  PROT 6.7 7.9  ALBUMIN 2.9* 3.4*   No results for input(s): LIPASE, AMYLASE in the last 168 hours. No results for input(s): AMMONIA in the last 168  hours.  Coagulation Profile: No results for input(s): INR, PROTIME in the last 168 hours.  Cardiac Enzymes: No results for input(s): CKTOTAL, CKMB, CKMBINDEX, TROPONINI in the last 168 hours.  BNP (last 3 results) No results for input(s): PROBNP in the last 8760 hours.  HbA1C: No results for input(s): HGBA1C in the last 72 hours.  CBG:  Recent Labs Lab 11/27/15 0752 11/27/15 1218 11/30/15 2019 12/01/15 0755 12/01/15 1201  GLUCAP 122* 178* 137* 122* 117*    Lipid Profile: No results for input(s): CHOL, HDL, LDLCALC, TRIG, CHOLHDL, LDLDIRECT in the last 72 hours.  Thyroid Function Tests: No results for input(s): TSH, T4TOTAL, FREET4, T3FREE, THYROIDAB in the last 72 hours.  Anemia Panel: No results for input(s): VITAMINB12, FOLATE, FERRITIN, TIBC, IRON, RETICCTPCT in the last 72 hours.  Urine analysis:    Component Value Date/Time   COLORURINE YELLOW 11/30/2015 1250   APPEARANCEUR HAZY (A) 11/30/2015 1250   LABSPEC 1.011 11/30/2015 1250  PHURINE 6.5 11/30/2015 1250   GLUCOSEU NEGATIVE 11/30/2015 1250   GLUCOSEU NEGATIVE 04/06/2010 1554   HGBUR TRACE (A) 11/30/2015 1250   BILIRUBINUR NEGATIVE 11/30/2015 1250   BILIRUBINUR Negative 04/03/2014 1642   KETONESUR NEGATIVE 11/30/2015 1250   PROTEINUR 30 (A) 11/30/2015 1250   UROBILINOGEN 1.0 04/03/2014 1642   UROBILINOGEN 1.0 04/03/2014 0058   NITRITE NEGATIVE 11/30/2015 1250   LEUKOCYTESUR LARGE (A) 11/30/2015 1250    Sepsis Labs: Lactic Acid, Venous    Component Value Date/Time   LATICACIDVEN 0.9 11/24/2015 2125    MICROBIOLOGY: Recent Results (from the past 240 hour(s))  Urine culture     Status: Abnormal   Collection Time: 11/24/15  3:51 PM  Result Value Ref Range Status   Specimen Description URINE, RANDOM  Final   Special Requests NONE  Final   Culture >=100,000 COLONIES/mL KLEBSIELLA PNEUMONIAE (A)  Final   Report Status 11/27/2015 FINAL  Final   Organism ID, Bacteria KLEBSIELLA PNEUMONIAE (A)   Final      Susceptibility   Klebsiella pneumoniae - MIC*    AMPICILLIN >=32 RESISTANT Resistant     CEFAZOLIN <=4 SENSITIVE Sensitive     CEFTRIAXONE <=1 SENSITIVE Sensitive     CIPROFLOXACIN <=0.25 SENSITIVE Sensitive     GENTAMICIN <=1 SENSITIVE Sensitive     IMIPENEM <=0.25 SENSITIVE Sensitive     NITROFURANTOIN 32 SENSITIVE Sensitive     TRIMETH/SULFA <=20 SENSITIVE Sensitive     AMPICILLIN/SULBACTAM 4 SENSITIVE Sensitive     PIP/TAZO <=4 SENSITIVE Sensitive     Extended ESBL NEGATIVE Sensitive     * >=100,000 COLONIES/mL KLEBSIELLA PNEUMONIAE  Culture, blood (Routine X 2) w Reflex to ID Panel     Status: None   Collection Time: 11/24/15  4:30 PM  Result Value Ref Range Status   Specimen Description BLOOD RIGHT ANTECUBITAL  Final   Special Requests BOTTLES DRAWN AEROBIC AND ANAEROBIC 5CC  Final   Culture NO GROWTH 5 DAYS  Final   Report Status 11/29/2015 FINAL  Final  Culture, blood (Routine X 2) w Reflex to ID Panel     Status: None   Collection Time: 11/24/15  4:58 PM  Result Value Ref Range Status   Specimen Description BLOOD LEFT ARM  Final   Special Requests BOTTLES DRAWN AEROBIC AND ANAEROBIC 5CC  Final   Culture NO GROWTH 5 DAYS  Final   Report Status 11/29/2015 FINAL  Final  C difficile quick scan w PCR reflex     Status: None   Collection Time: 11/25/15  6:18 PM  Result Value Ref Range Status   C Diff antigen NEGATIVE NEGATIVE Final   C Diff toxin NEGATIVE NEGATIVE Final   C Diff interpretation No C. difficile detected.  Final  Clostridium Difficile by PCR     Status: None   Collection Time: 11/25/15  7:41 PM  Result Value Ref Range Status   Toxigenic C Difficile by pcr NEGATIVE NEGATIVE Final    Comment: Patient is colonized with non toxigenic C. difficile. May not need treatment unless significant symptoms are present.    RADIOLOGY STUDIES/RESULTS: Ct Abdomen Pelvis Wo Contrast  Result Date: 12/01/2015 CLINICAL DATA:  Chronic nausea and vomiting. Prior  CABG last month. Recurrent urinary tract infection. EXAM: CT ABDOMEN AND PELVIS WITHOUT CONTRAST TECHNIQUE: Multidetector CT imaging of the abdomen and pelvis was performed following the standard protocol without IV contrast. COMPARISON:  Abdominal radiograph of 11/14/2015 FINDINGS: Lower chest: Left lower lobe airspace opacity primarily from atelectasis  but possibly with superimposed airspace filling process such as pneumonia. Small left pleural effusion, nonspecific for transudative or exudative etiology. Mild atelectasis in the right lower lobe. Small pericardial effusion. Aortic valve prosthesis. Coronary artery atherosclerotic calcification and prior median sternotomy. The sternotomy is still visible with about 3 mm of bony separation at the lower sternotomy site. Several of the lower sternotomy wires capture only the very margin of the left side of the sternotomy. Hepatobiliary: Dependent density in the gallbladder otherwise unremarkable. Pancreas: Unusual pancreatic contour which is smooth but thin, with little stippling of the pancreatic parenchyma ; appearance not changed from 08/27/2015. Spleen: Unremarkable Adrenals/Urinary Tract: 3 mm left kidney lower pole nonobstructive calculus. No hydronephrosis. Thick-walled urinary bladder diffusely. Stomach/Bowel: Mild prominence of stool in the rectum. Orally administered contrast extends down through to the rectum. Appendix normal. Vascular/Lymphatic: Aortoiliac atherosclerotic vascular disease. No pathologic adenopathy identified. Reproductive: Unremarkable Other: Unremarkable Musculoskeletal: A transitional lumbosacral vertebra is assumed to represent the S1 level. Careful correlation with this numbering strategy prior to any procedural intervention would be recommended. Lumbar spondylosis and degenerative disc disease cause bilateral foraminal stenosis at L3-4, L4-5, and L5-S1. Direct left inguinal hernia contains adipose tissue. There is degenerative  arthropathy of both hips. IMPRESSION: 1. Urinary bladder diffuse wall thickening may reflect cystitis. 2. Nonobstructive 3 mm left kidney lower pole nonobstructive calculus. 3. Left lower lobe airspace opacity partially from atelectasis but partially from airspace filling process, pneumonia not excluded. Small left pleural effusion. 4. Postoperative findings including recent median sternotomy and small pericardial effusion. Two of the 3 visualized lower sternotomy wires only capture the edge of the left side of the sternotomy ; this is probably incidental and dehiscence is considered unlikely, but correlate with any symptoms directly related to the sternum. 5. Dependent density in the gallbladder from sludge or gallstones. 6. Mild prominence of stool in the rectum, probably incidental. 7.  Aortoiliac atherosclerotic vascular disease. 8. Lumbar spondylosis and degenerative disc disease causing foraminal impingement at L3-4, L4-5, and L5-S1 (assumes transitional S1 vertebra). 9. Direct left inguinal hernia contains adipose tissue. 10. Degenerative arthropathy of both hips. Electronically Signed   By: Van Clines M.D.   On: 12/01/2015 14:07   Dg Chest 1 View  Result Date: 11/05/2015 CLINICAL DATA:  Status post thoracentesis on the right EXAM: CHEST 1 VIEW COMPARISON:  November 04, 2015 FINDINGS: No pneumothorax evident. There has been interval resolution of pleural effusion on the right. A small left pleural effusion remains. There is bibasilar atelectatic change with patchy airspace consolidation in the left base. Heart is borderline enlarged with pulmonary vascularity within normal limits. Patient is status post coronary artery bypass grafting and aortic valve replacement. No bone lesions. No demonstrable adenopathy. There is an old healed fracture of the left clavicle. IMPRESSION: No pneumothorax following thoracentesis on the right. Interval resolution of right pleural effusion. Small persistent left  pleural effusion. Persistent left lower lobe region consolidation. Stable cardiac silhouette. Electronically Signed   By: Lowella Grip III M.D.   On: 11/05/2015 15:23   Dg Chest 2 View  Result Date: 11/30/2015 CLINICAL DATA:  Left chest wall pain EXAM: CHEST  2 VIEW COMPARISON:  11/24/2015 FINDINGS: Aortic valve replacement and CABG again noted. Heart size mildly enlarged. Negative for heart failure Left lower lobe consolidation unchanged. Minimal right lower lobe airspace disease is improved. Probable small left effusion IMPRESSION: Left lower lobe consolidation unchanged. Mild right lower lobe airspace disease slightly improved. Electronically Signed   By: Juanda Crumble  Carlis Abbott M.D.   On: 11/30/2015 12:33   Dg Chest 2 View  Result Date: 11/14/2015 CLINICAL DATA:  Medical hx: cancer, diabetes, hypertension. Patient is lightheaded with limited mobility. EXAM: CHEST  2 VIEW COMPARISON:  11/06/2015 FINDINGS: The cardiac silhouette is mildly enlarged. Changes from cardiac surgery and valve replacement are stable. No mediastinal or hilar masses.  No convincing adenopathy. There is left greater than right lung base opacity mildly improved from the prior study. This may reflect pneumonia, atelectasis or a combination. Minimal left pleural effusion. No pneumothorax. Remainder of the lungs is clear. IMPRESSION: 1. Mild improvement in left greater than right lung base opacity. Decreased left pleural fluid. Residual lung base opacity is likely atelectasis. Pneumonia is possible. 2. No pulmonary edema.  No new abnormalities. Electronically Signed   By: Lajean Manes M.D.   On: 11/14/2015 09:27   Dg Chest 2 View  Result Date: 11/06/2015 CLINICAL DATA:  Pt states he had 2 valves replacements and an aortic aneurysm repair last week. Pt has no other symptoms or complaints and no other heart problems. Pt has diabetes. Pleural effusion, right EXAM: CHEST  2 VIEW COMPARISON:  11/05/2015 FINDINGS: Changes from cardiac  surgery and valve replacement are stable. Cardiac silhouette is normal in size. No mediastinal or hilar masses. There is left greater than right lung base opacity. On the left, this is likely due to a combination of a small pleural effusion with atelectasis. The right lung base opacity is likely atelectasis. Is no overt pulmonary edema. No pneumothorax. IMPRESSION: 1. Bibasilar lung opacity is similar to the most recent prior exam. This is consistent with a combination a left pleural effusion and bilateral lung base atelectasis. 2. No overt pulmonary edema.  No pneumothorax. Electronically Signed   By: Lajean Manes M.D.   On: 11/06/2015 13:24   Dg Chest 2 View  Result Date: 11/04/2015 CLINICAL DATA:  Hypoxia, dizziness EXAM: CHEST  2 VIEW COMPARISON:  11/02/2015 FINDINGS: Status post median sternotomy and cardiac valve replacement. Cardiomediastinal silhouette is stable. Stable title again noted some right perihilar scarring. Bilateral small pleural effusion with bilateral basilar atelectasis or infiltrate. No pulmonary edema. IMPRESSION: No pulmonary edema. Bilateral small pleural effusion with bilateral basilar atelectasis or infiltrate. Electronically Signed   By: Lahoma Crocker M.D.   On: 11/04/2015 12:51   Dg Chest 2 View  Result Date: 11/02/2015 CLINICAL DATA:  Cardiac surgery. EXAM: CHEST  2 VIEW COMPARISON:  Nineteen 2017. FINDINGS: Prior CABG. Cardiac valve replacement. Stable cardiomegaly. Persistent bibasilar atelectasis and or infiltrate. Poor inspiration on lateral view. Bilateral pleural effusions cannot be excluded. IMPRESSION: 1. Prior CABG. Prior cardiac valve replacement. Stable cardiomegaly. No pulmonary venous congestion. 2. Stable bibasilar atelectasis and/or mild infiltrates. Poor inspiration on lateral view. Bilateral pleural effusions cannot be excluded. Electronically Signed   By: Marcello Moores  Register   On: 11/02/2015 07:36   Dg Abd 1 View  Result Date: 11/14/2015 CLINICAL DATA:   Medical hx: cancer, diabetes, hypertension. Patient is lightheaded with limited mobility. EXAM: ABDOMEN - 1 VIEW COMPARISON:  None. FINDINGS: Normal bowel gas pattern. No evidence of renal or ureteral stones. Scattered aortic and iliac artery vascular calcifications. Soft tissues are otherwise unremarkable. No significant bony abnormality. IMPRESSION: No acute findings. Electronically Signed   By: Lajean Manes M.D.   On: 11/14/2015 09:28   Ct Head Wo Contrast  Result Date: 11/24/2015 CLINICAL DATA:  Headache and dizziness since bypass surgery on 10/29/2015. EXAM: CT HEAD WITHOUT CONTRAST TECHNIQUE: Contiguous axial  images were obtained from the base of the skull through the vertex without intravenous contrast. COMPARISON:  11/13/2015 FINDINGS: Brain: The ventricles are normal in size and configuration. No extra-axial fluid collections are identified. The gray-white differentiation is normal. No CT findings for acute intracranial process such as hemorrhage or infarction. No mass lesions. The brainstem and cerebellum are grossly normal. Vascular: Minimal scattered vascular calcifications. No aneurysm or hyperdense vessels. Skull: No skull fracture or bone lesions. Sinuses/Orbits: The paranasal sinuses and mastoid air cells are clear. The globes are intact. Other: No scalp lesion or hematoma. IMPRESSION: Normal and stable head CT. Electronically Signed   By: Marijo Sanes M.D.   On: 11/24/2015 15:56   Ct Head Wo Contrast  Result Date: 11/13/2015 CLINICAL DATA:  Nausea, vomiting, and diarrhea for 2 weeks. Near syncope. EXAM: CT HEAD WITHOUT CONTRAST TECHNIQUE: Contiguous axial images were obtained from the base of the skull through the vertex without intravenous contrast. COMPARISON:  None. FINDINGS: Brain: No subdural, epidural, or subarachnoid hemorrhage. Ventricles and sulci are normal. Cerebellum, brainstem, and basal cisterns are normal. No acute cortical ischemia or infarct. Vascular: Calcified  atherosclerosis is seen in the intracranial portions of the carotid arteries. Skull: Normal. Negative for fracture or focal lesion. Sinuses/Orbits: No acute finding. Other: No other abnormalities. IMPRESSION: No acute intracranial abnormalities. Electronically Signed   By: Dorise Bullion III M.D   On: 11/13/2015 11:56   Dg Chest Port 1 View  Result Date: 11/24/2015 CLINICAL DATA:  Fever and atrial fibrillation EXAM: PORTABLE CHEST 1 VIEW COMPARISON:  November 14, 2015 FINDINGS: There is airspace consolidation in the left lower lobe consistent with pneumonia. There is a small associated left pleural effusion. The lungs elsewhere are clear. Heart is upper normal in size with pulmonary vascularity within normal limits. The patient is status post aortic valve replacement. The patient is also status post coronary artery bypass grafting. No adenopathy. There is evidence of old healed fracture of the left clavicle. IMPRESSION: Lobe airspace consolidation consistent with pneumonia. Small left pleural effusion. Lungs elsewhere clear. Stable cardiac silhouette. Followup PA and lateral chest radiographs recommended in 3-4 weeks following trial of antibiotic therapy to ensure resolution and exclude underlying malignancy. Electronically Signed   By: Lowella Grip III M.D.   On: 11/24/2015 14:00   US Thoracentesis Asp Pleural Space W/img Guide  Result Date: 11/05/2015 INDICATION: Reason CABG. Right-sided pleural effusion. Request therapeutic thoracentesis. EXAM: ULTRASOUND GUIDED RIGHT THORACENTESIS MEDICATIONS: None. COMPLICATIONS: None immediate. PROCEDURE: An ultrasound guided thoracentesis was thoroughly discussed with the patient and questions answered. The benefits, risks, alternatives and complications were also discussed. The patient understands and wishes to proceed with the procedure. Written consent was obtained. Ultrasound was performed to localize and mark an adequate pocket of fluid in the right chest.  The area was then prepped and draped in the normal sterile fashion. 1% Lidocaine was used for local anesthesia. Under ultrasound guidance a Safe-T-Centesis catheter was introduced. Thoracentesis was performed. The catheter was removed and a dressing applied. FINDINGS: A total of approximately 700 mL of bloody pleural fluid was removed. IMPRESSION: Successful ultrasound guided right thoracentesis yielding 700 mL of pleural fluid. Read by: Ascencion Dike PA-C Electronically Signed   By: Corrie Mckusick D.O.   On: 11/05/2015 15:13     LOS: 1 day   Oren Binet, MD  [facility] Pager:[phone]  If 7PM-7AM, please contact night-coverage [email or web address] Password [email or web address] 12/01/2015, 3:19 PM

## 2015-12-02 ENCOUNTER — Encounter (HOSPITAL_COMMUNITY)
Admission: EM | Disposition: A | Discharge status: Home / Self Care | Payer: Self-pay | Source: Home / Self Care | Attending: Internal Medicine

## 2015-12-02 ENCOUNTER — Encounter (HOSPITAL_COMMUNITY): Payer: Self-pay

## 2015-12-02 DIAGNOSIS — S82142A Displaced bicondylar fracture of left tibia, initial encounter for closed fracture: Secondary | ICD-10-CM | POA: Diagnosis present

## 2015-12-02 DIAGNOSIS — I480 Paroxysmal atrial fibrillation: Secondary | ICD-10-CM

## 2015-12-02 HISTORY — PX: ESOPHAGOGASTRODUODENOSCOPY: SHX5428

## 2015-12-02 LAB — URINE CULTURE: CULTURE: NO GROWTH

## 2015-12-02 LAB — ACTH STIMULATION, 3 TIME POINTS
CORTISOL 30 MIN: 22.8 ug/dL
CORTISOL 60 MIN: 25.9 ug/dL
Cortisol, Base: 11.1 ug/dL

## 2015-12-02 LAB — GLUCOSE, CAPILLARY
Glucose-Capillary: 112 mg/dL — ABNORMAL HIGH (ref 65–99)
Glucose-Capillary: 141 mg/dL — ABNORMAL HIGH (ref 65–99)
Glucose-Capillary: 112 mg/dL — ABNORMAL HIGH (ref 65–99)
Glucose-Capillary: 147 mg/dL — ABNORMAL HIGH (ref 65–99)

## 2015-12-02 LAB — PROCALCITONIN

## 2015-12-02 SURGERY — EGD (ESOPHAGOGASTRODUODENOSCOPY)
Anesthesia: Moderate Sedation

## 2015-12-02 MED ORDER — DIPHENHYDRAMINE HCL 50 MG/ML IJ SOLN
INTRAMUSCULAR | Status: AC
  Filled 2015-12-02: qty 1

## 2015-12-02 MED ORDER — BUTAMBEN-TETRACAINE-BENZOCAINE 2-2-14 % EX AERO
INHALATION_SPRAY | CUTANEOUS | Status: DC | PRN
  Administered 2015-12-02: 1 via TOPICAL

## 2015-12-02 MED ORDER — FENTANYL CITRATE (PF) 100 MCG/2ML IJ SOLN
INTRAMUSCULAR | Status: AC
  Filled 2015-12-02: qty 2

## 2015-12-02 MED ORDER — MIDAZOLAM HCL 10 MG/2ML IJ SOLN
INTRAMUSCULAR | Status: DC | PRN
  Administered 2015-12-02: 2 mg via INTRAVENOUS
  Administered 2015-12-02: 1 mg via INTRAVENOUS

## 2015-12-02 MED ORDER — SODIUM CHLORIDE 0.9 % IV SOLN
INTRAVENOUS | Status: DC
  Administered 2015-12-02: 500 mL via INTRAVENOUS

## 2015-12-02 MED ORDER — FENTANYL CITRATE (PF) 100 MCG/2ML IJ SOLN
INTRAMUSCULAR | Status: DC | PRN
  Administered 2015-12-02: 25 ug via INTRAVENOUS

## 2015-12-02 MED ORDER — MIDAZOLAM HCL 5 MG/ML IJ SOLN
INTRAMUSCULAR | Status: AC
  Filled 2015-12-02: qty 2

## 2015-12-02 MED ORDER — RIVAROXABAN 20 MG PO TABS
20.0000 mg | ORAL_TABLET | Freq: Every day | ORAL | Status: DC
  Administered 2015-12-02: 20 mg via ORAL
  Filled 2015-12-02 (×2): qty 1

## 2015-12-02 MED ORDER — DIPHENHYDRAMINE HCL 50 MG/ML IJ SOLN
INTRAMUSCULAR | Status: DC | PRN
  Administered 2015-12-02: 25 mg via INTRAVENOUS

## 2015-12-02 NOTE — Progress Notes (Signed)
PT Note: Pt being wheeled off of unit for endo.  Will check back as schedule permits. Santiago Glad L. Tamala Julian, [place] Pager [phone] 12/02/2015

## 2015-12-02 NOTE — Evaluation (Signed)
Physical Therapy Evaluation Patient Details Name: [name] MRN: [id number] DOB: 07/11/1945 Today's Date: 12/02/2015   History of Present Illness  Patient is a 70 y.o. male with recent history of 4 vessel CABG and bioprosthetic aortic valve replacement in September 2015, following which he has had 2 hospitalizations for vomiting, orthostatic hypotension and UTI-admitted again on 10/17 for recurrent vomiting. PMH inlcudes aortic stenosis, Ca, carpal tunnel syndrome, CAD, DM, heart murmu, hyperlipidemia, HTN  Clinical Impression  Pt admitted with above diagnosis. Pt currently with functional limitations due to the deficits listed below (see PT Problem List).  Pt will benefit from skilled PT to increase their independence and safety with mobility to allow discharge to the venue listed below.  Pt moving well and had no dizziness during PT session.  Pt was fatigued somewhat due to not having eaten since yesterday at dinner and therefore deferred stair assessment today. Pt reports he was currently being seen by HHPT and would like to continue.     Follow Up Recommendations Home health PT;Supervision for mobility/OOB    Equipment Recommendations  None recommended by PT    Recommendations for Other Services       Precautions / Restrictions Precautions Precautions: Sternal;Fall Restrictions Weight Bearing Restrictions: No      Mobility  Bed Mobility Overal bed mobility: Needs Assistance Bed Mobility: Supine to Sit     Supine to sit: Min guard;HOB elevated     General bed mobility comments: HOB elevated and no physical A  Transfers Overall transfer level: Needs assistance Equipment used: None Transfers: Sit to/from Stand Sit to Stand: Supervision         General transfer comment: S for safety but able to stand without UE A due to CABG last month  Ambulation/Gait Ambulation/Gait assistance: Min guard;Supervision Ambulation Distance (Feet): 300 Feet Assistive device:  Rolling walker (2 wheeled) Gait Pattern/deviations: Step-through pattern Gait velocity: decreased   General Gait Details: Pt reports no dizziness throughout entire gait session.  MIN/guard to S with no LOB noted.  Stairs            Wheelchair Mobility    Modified Rankin (Stroke Patients Only)       Balance Overall balance assessment: Needs assistance   Sitting balance-Leahy Scale: Good                                       Pertinent Vitals/Pain Pain Assessment: No/denies pain    Home Living Family/patient expects to be discharged to:: Private residence Living Arrangements: Spouse/significant other;Children Available Help at Discharge: Family;Available 24 hours/day Type of Home: House Home Access: Level entry     Home Layout: Two level;Bed/bath upstairs;1/2 bath on main level Home Equipment: Walker - 2 wheels;Bedside commode      Prior Function Level of Independence: Independent with assistive device(s)         Comments: Amb with RW since CABG 9/15. Prior to that was I with ambulation.     Hand Dominance        Extremity/Trunk Assessment   Upper Extremity Assessment: Overall WFL for tasks assessed           Lower Extremity Assessment: Overall WFL for tasks assessed         Communication   Communication: No difficulties  Cognition Arousal/Alertness: Awake/alert Behavior During Therapy: WFL for tasks assessed/performed Overall Cognitive Status: Within Functional Limits for tasks assessed  General Comments General comments (skin integrity, edema, etc.): Pt reports he has not eaten since dinner yesterday due to being NPO    Exercises     Assessment/Plan    PT Assessment Patient needs continued PT services  PT Problem List Decreased activity tolerance;Decreased balance;Decreased mobility          PT Treatment Interventions DME instruction;Gait training;Stair training;Functional mobility  training;Therapeutic activities;Therapeutic exercise;Balance training    PT Goals (Current goals can be found in the Care Plan section)  Acute Rehab PT Goals Patient Stated Goal: go home PT Goal Formulation: With patient Time For Goal Achievement: 12/09/15 Potential to Achieve Goals: Good    Frequency Min 3X/week   Barriers to discharge        Co-evaluation               End of Session Equipment Utilized During Treatment: Gait belt Activity Tolerance: Patient tolerated treatment well Patient left: in chair;with call bell/phone within reach Nurse Communication: Mobility status         Time: 1349-1405 PT Time Calculation (min) (ACUTE ONLY): 16 min   Charges:   PT Evaluation $PT Eval Moderate Complexity: 1 Procedure     PT G Codes:        Neyah Ellerman LUBECK 12/02/2015, 2:25 PM

## 2015-12-02 NOTE — Progress Notes (Signed)
PROGRESS NOTE        PATIENT DETAILS Name: [name] Age: 70 y.o. Sex: male Date of Birth: 12-29-1945 Admit Date: 11/30/2015 Admitting Physician Orson Eva, MD [id number] Nafziger, NP  Brief Narrative: Patient is a 70 y.o. male with recent history of 4 vessel CABG and bioprosthetic aortic valve replacement in September 2015, following which he has had 2 hospitalizations for vomiting, orthostatic hypotension and UTI-admitted again on 10/17 for recurrent vomiting.  Subjective: No vomiting today.  No chest pain No shortness of breath  Assessment/Plan: Active Problems: Vomiting:? Etiology-recurrent/ongoing-third admission this month alone. CT of the abdomen with no evidence of gastric outlet obstruction. GI consulted, underwent endoscopy without any findings that would account for recurrent vomiting. We will go ahead and get a gastric emptying study. Await further recommendations from GI, if workup continues to be negative, may need to rule out biliary dyskinesia.  Recurrent pyuria: Urine culture on 10/17 negative-we'll stop Rocephin and monitor off antibiotics.CT of the abdomen without any major structural abnormality.  Pulmonary infiltrate: Doubt pneumonia-no symptoms. Furthermore pro-calcitonin essentially negative.   Orthostatic hypotension: Apparently developed post CABG-off all antihypertensives. Continue Midodrine. ACTH stimulation test negative.Have counseled regarding measures to minimize his symptoms from orthostatic hypotension. Gradual transition from supine to sitting to standing and ambulating, adequate hydration, increasing salt intake, elevating legs up when he is sitting, where TED hose when ambulating. He verbalizes understanding.  Type 2 diabetes: CBGs relatively stable, continue Lantus and SSI.  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  MEDICATIONS: Anti-infectives    Start     Dose/Rate Route Frequency Ordered Stop   11/30/15 2015  cefTRIAXone (ROCEPHIN) 1 g in dextrose 5 % 50 mL IVPB     1 g 100 mL/hr over 30 Minutes Intravenous Every 24 hours 11/30/15 2015        Scheduled Meds: . amiodarone  200 mg Oral BID  . atorvastatin  40 mg Oral Daily  . cefTRIAXone (ROCEPHIN)  IV  1 g Intravenous Q24H  . insulin aspart  0-5 Units Subcutaneous QHS  . insulin aspart  0-9 Units Subcutaneous TID WC  . insulin glargine  8 Units Subcutaneous QHS  . midodrine  10 mg Oral TID WC  . pantoprazole  40 mg Oral Q1200  . saccharomyces boulardii  250 mg Oral BID  . sodium chloride flush  3 mL Intravenous Q12H   Continuous Infusions: . 0.9 % NaCl with KCl 20 mEq / L 10 mL/hr at 12/01/15 1735   PRN Meds:.acetaminophen **OR** acetaminophen, ondansetron   PHYSICAL EXAM: Vital signs: Vitals:   12/02/15 1040 12/02/15 1048 12/02/15 1050 12/02/15 1055  BP: (!) 157/66 (!) 157/72 (!) 157/72 (!) 153/68  Pulse: 91 86 88 87  Resp: (!) 26 (!) 23 (!) 27 15  Temp:  98.2 F (36.8 C)    TempSrc:  Oral    SpO2: 100%  97% 97% 97%  Weight:      Height:       Filed Weights   11/30/15 1032 11/30/15 2044 12/01/15 2043  Weight: 81.2 kg (179 lb) 84.3 kg (185 lb 12.8 oz) 84.5 kg (186 lb 4.6 oz)   Body mass index is 25.27 kg/m.   General appearance :Awake, alert, not in any distress. Speech Clear.Chronically sick appearing Eyes:, pupils equally reactive to light and accomodation,no scleral  icterus.Pink conjunctiva HEENT: Atraumatic and Normocephalic Neck: supple, no JVD. No cervical lymphadenopathy. No thyromegaly Resp:Good air entry bilaterally, no added sounds  CVS: S1 S2 regular, no murmurs.  GI: Bowel sounds present, Non tender and not distended with no gaurding, rigidity or rebound.No organomegaly Extremities: B/L Lower Ext shows no edema, both legs are warm to touch Neurology:  speech clear,Non focal, sensation is grossly intact. Psychiatric: Normal judgment and insight. Alert and oriented x 3. Normal mood. Musculoskeletal:gait appears to be normal.No digital cyanosis Skin:No Rash, warm and dry Wounds:N/A  I have personally reviewed following labs and imaging studies  LABORATORY DATA: CBC:  Recent Labs Lab 11/26/15 0609 11/30/15 1127 12/01/15 0750  WBC 9.0 11.6* 8.7  NEUTROABS  --  8.7*  --   HGB 10.1* 11.2* 10.0*  HCT 31.5* 34.7* 30.5*  MCV 87.3 87.2 86.6  PLT 326 342 [id number]    Basic Metabolic Panel:  Recent Labs Lab 11/26/15 0609 11/30/15 1127 12/01/15 0750  NA 138 137 138  K 3.9 4.4 4.4  CL 103 100* 104  CO2 28 27 26   GLUCOSE 113* 155* 124*  BUN <5* 5* 5*  CREATININE 1.10 1.08 0.96  CALCIUM 8.8* 9.8 9.4    GFR: Estimated Creatinine Clearance: 80.8 mL/min (by C-G formula based on SCr of 0.96 mg/dL).  Liver Function Tests:  Recent Labs Lab 11/26/15 0609 11/30/15 1127  AST 24 21  ALT 23 18  ALKPHOS 75 82  BILITOT 0.6 0.5  PROT 6.7 7.9  ALBUMIN 2.9* 3.4*   No results for input(s): LIPASE, AMYLASE in the last 168 hours. No results for input(s): AMMONIA in the last 168 hours.  Coagulation Profile: No results for input(s): INR, PROTIME in the last 168 hours.  Cardiac Enzymes: No results for input(s): CKTOTAL, CKMB, CKMBINDEX, TROPONINI in the last 168 hours.  BNP (last 3 results) No results for input(s): PROBNP in the last 8760 hours.  HbA1C: No results for input(s): HGBA1C in the last 72 hours.  CBG:  Recent Labs Lab  12/01/15 1201 12/01/15 1711 12/01/15 2031 12/02/15 0817 12/02/15 1131  GLUCAP 117* 154* 137* 112* 141*    Lipid Profile: No results for input(s): CHOL, HDL, LDLCALC, TRIG, CHOLHDL, LDLDIRECT in the last 72 hours.  Thyroid Function Tests: No results for input(s): TSH, T4TOTAL, FREET4, T3FREE, THYROIDAB in the last 72 hours.  Anemia Panel: No results for input(s): VITAMINB12, FOLATE, FERRITIN, TIBC, IRON, RETICCTPCT in the last 72 hours.  Urine analysis:    Component Value Date/Time   COLORURINE YELLOW 11/30/2015 1250   APPEARANCEUR HAZY (A) 11/30/2015 1250   LABSPEC 1.011 11/30/2015 1250   PHURINE 6.5 11/30/2015 1250   GLUCOSEU NEGATIVE 11/30/2015 1250   GLUCOSEU NEGATIVE 04/06/2010 1554   HGBUR TRACE (A) 11/30/2015 1250   BILIRUBINUR NEGATIVE 11/30/2015 1250   BILIRUBINUR Negative 04/03/2014 1642   KETONESUR NEGATIVE 11/30/2015 1250   PROTEINUR 30 (A) 11/30/2015 1250   UROBILINOGEN 1.0 04/03/2014 1642   UROBILINOGEN 1.0 04/03/2014 0058   NITRITE NEGATIVE 11/30/2015 1250   LEUKOCYTESUR LARGE (A) 11/30/2015  1250    Sepsis Labs: Lactic Acid, Venous    Component Value Date/Time   LATICACIDVEN 0.9 11/24/2015 2125    MICROBIOLOGY: Recent Results (from the past 240 hour(s))  Urine culture     Status: Abnormal   Collection Time: 11/24/15  3:51 PM  Result Value Ref Range Status   Specimen Description URINE, RANDOM  Final   Special Requests NONE  Final   Culture >=100,000 COLONIES/mL KLEBSIELLA PNEUMONIAE (A)  Final   Report Status 11/27/2015 FINAL  Final   Organism ID, Bacteria KLEBSIELLA PNEUMONIAE (A)  Final      Susceptibility   Klebsiella pneumoniae - MIC*    AMPICILLIN >=32 RESISTANT Resistant     CEFAZOLIN <=4 SENSITIVE Sensitive     CEFTRIAXONE <=1 SENSITIVE Sensitive     CIPROFLOXACIN <=0.25 SENSITIVE Sensitive     GENTAMICIN <=1 SENSITIVE Sensitive     IMIPENEM <=0.25 SENSITIVE Sensitive     NITROFURANTOIN 32 SENSITIVE Sensitive     TRIMETH/SULFA <=20  SENSITIVE Sensitive     AMPICILLIN/SULBACTAM 4 SENSITIVE Sensitive     PIP/TAZO <=4 SENSITIVE Sensitive     Extended ESBL NEGATIVE Sensitive     * >=100,000 COLONIES/mL KLEBSIELLA PNEUMONIAE  Culture, blood (Routine X 2) w Reflex to ID Panel     Status: None   Collection Time: 11/24/15  4:30 PM  Result Value Ref Range Status   Specimen Description BLOOD RIGHT ANTECUBITAL  Final   Special Requests BOTTLES DRAWN AEROBIC AND ANAEROBIC 5CC  Final   Culture NO GROWTH 5 DAYS  Final   Report Status 11/29/2015 FINAL  Final  Culture, blood (Routine X 2) w Reflex to ID Panel     Status: None   Collection Time: 11/24/15  4:58 PM  Result Value Ref Range Status   Specimen Description BLOOD LEFT ARM  Final   Special Requests BOTTLES DRAWN AEROBIC AND ANAEROBIC 5CC  Final   Culture NO GROWTH 5 DAYS  Final   Report Status 11/29/2015 FINAL  Final  C difficile quick scan w PCR reflex     Status: None   Collection Time: 11/25/15  6:18 PM  Result Value Ref Range Status   C Diff antigen NEGATIVE NEGATIVE Final   C Diff toxin NEGATIVE NEGATIVE Final   C Diff interpretation No C. difficile detected.  Final  Clostridium Difficile by PCR     Status: None   Collection Time: 11/25/15  7:41 PM  Result Value Ref Range Status   Toxigenic C Difficile by pcr NEGATIVE NEGATIVE Final    Comment: Patient is colonized with non toxigenic C. difficile. May not need treatment unless significant symptoms are present.  Urine culture     Status: None   Collection Time: 11/30/15 12:50 PM  Result Value Ref Range Status   Specimen Description URINE, RANDOM  Final   Special Requests NONE  Final   Culture NO GROWTH 1 DAY  Final   Report Status 12/02/2015 FINAL  Final    RADIOLOGY STUDIES/RESULTS: Ct Abdomen Pelvis Wo Contrast  Result Date: 12/01/2015 CLINICAL DATA:  Chronic nausea and vomiting. Prior CABG last month. Recurrent urinary tract infection. EXAM: CT ABDOMEN AND PELVIS WITHOUT CONTRAST TECHNIQUE: Multidetector  CT imaging of the abdomen and pelvis was performed following the standard protocol without IV contrast. COMPARISON:  Abdominal radiograph of 11/14/2015 FINDINGS: Lower chest: Left lower lobe airspace opacity primarily from atelectasis but possibly with superimposed airspace filling process such as pneumonia. Small left pleural effusion, nonspecific for transudative or  exudative etiology. Mild atelectasis in the right lower lobe. Small pericardial effusion. Aortic valve prosthesis. Coronary artery atherosclerotic calcification and prior median sternotomy. The sternotomy is still visible with about 3 mm of bony separation at the lower sternotomy site. Several of the lower sternotomy wires capture only the very margin of the left side of the sternotomy. Hepatobiliary: Dependent density in the gallbladder otherwise unremarkable. Pancreas: Unusual pancreatic contour which is smooth but thin, with little stippling of the pancreatic parenchyma ; appearance not changed from 08/27/2015. Spleen: Unremarkable Adrenals/Urinary Tract: 3 mm left kidney lower pole nonobstructive calculus. No hydronephrosis. Thick-walled urinary bladder diffusely. Stomach/Bowel: Mild prominence of stool in the rectum. Orally administered contrast extends down through to the rectum. Appendix normal. Vascular/Lymphatic: Aortoiliac atherosclerotic vascular disease. No pathologic adenopathy identified. Reproductive: Unremarkable Other: Unremarkable Musculoskeletal: A transitional lumbosacral vertebra is assumed to represent the S1 level. Careful correlation with this numbering strategy prior to any procedural intervention would be recommended. Lumbar spondylosis and degenerative disc disease cause bilateral foraminal stenosis at L3-4, L4-5, and L5-S1. Direct left inguinal hernia contains adipose tissue. There is degenerative arthropathy of both hips. IMPRESSION: 1. Urinary bladder diffuse wall thickening may reflect cystitis. 2. Nonobstructive 3 mm  left kidney lower pole nonobstructive calculus. 3. Left lower lobe airspace opacity partially from atelectasis but partially from airspace filling process, pneumonia not excluded. Small left pleural effusion. 4. Postoperative findings including recent median sternotomy and small pericardial effusion. Two of the 3 visualized lower sternotomy wires only capture the edge of the left side of the sternotomy ; this is probably incidental and dehiscence is considered unlikely, but correlate with any symptoms directly related to the sternum. 5. Dependent density in the gallbladder from sludge or gallstones. 6. Mild prominence of stool in the rectum, probably incidental. 7.  Aortoiliac atherosclerotic vascular disease. 8. Lumbar spondylosis and degenerative disc disease causing foraminal impingement at L3-4, L4-5, and L5-S1 (assumes transitional S1 vertebra). 9. Direct left inguinal hernia contains adipose tissue. 10. Degenerative arthropathy of both hips. Electronically Signed   By: Van Clines M.D.   On: 12/01/2015 14:07   Dg Chest 1 View  Result Date: 11/05/2015 CLINICAL DATA:  Status post thoracentesis on the right EXAM: CHEST 1 VIEW COMPARISON:  November 04, 2015 FINDINGS: No pneumothorax evident. There has been interval resolution of pleural effusion on the right. A small left pleural effusion remains. There is bibasilar atelectatic change with patchy airspace consolidation in the left base. Heart is borderline enlarged with pulmonary vascularity within normal limits. Patient is status post coronary artery bypass grafting and aortic valve replacement. No bone lesions. No demonstrable adenopathy. There is an old healed fracture of the left clavicle. IMPRESSION: No pneumothorax following thoracentesis on the right. Interval resolution of right pleural effusion. Small persistent left pleural effusion. Persistent left lower lobe region consolidation. Stable cardiac silhouette. Electronically Signed   By:  Lowella Grip III M.D.   On: 11/05/2015 15:23   Dg Chest 2 View  Result Date: 11/30/2015 CLINICAL DATA:  Left chest wall pain EXAM: CHEST  2 VIEW COMPARISON:  11/24/2015 FINDINGS: Aortic valve replacement and CABG again noted. Heart size mildly enlarged. Negative for heart failure Left lower lobe consolidation unchanged. Minimal right lower lobe airspace disease is improved. Probable small left effusion IMPRESSION: Left lower lobe consolidation unchanged. Mild right lower lobe airspace disease slightly improved. Electronically Signed   By: Franchot Gallo M.D.   On: 11/30/2015 12:33   Dg Chest 2 View  Result Date: 11/14/2015 CLINICAL  DATA:  Medical hx: cancer, diabetes, hypertension. Patient is lightheaded with limited mobility. EXAM: CHEST  2 VIEW COMPARISON:  11/06/2015 FINDINGS: The cardiac silhouette is mildly enlarged. Changes from cardiac surgery and valve replacement are stable. No mediastinal or hilar masses.  No convincing adenopathy. There is left greater than right lung base opacity mildly improved from the prior study. This may reflect pneumonia, atelectasis or a combination. Minimal left pleural effusion. No pneumothorax. Remainder of the lungs is clear. IMPRESSION: 1. Mild improvement in left greater than right lung base opacity. Decreased left pleural fluid. Residual lung base opacity is likely atelectasis. Pneumonia is possible. 2. No pulmonary edema.  No new abnormalities. Electronically Signed   By: Lajean Manes M.D.   On: 11/14/2015 09:27   Dg Chest 2 View  Result Date: 11/06/2015 CLINICAL DATA:  Pt states he had 2 valves replacements and an aortic aneurysm repair last week. Pt has no other symptoms or complaints and no other heart problems. Pt has diabetes. Pleural effusion, right EXAM: CHEST  2 VIEW COMPARISON:  11/05/2015 FINDINGS: Changes from cardiac surgery and valve replacement are stable. Cardiac silhouette is normal in size. No mediastinal or hilar masses. There is left  greater than right lung base opacity. On the left, this is likely due to a combination of a small pleural effusion with atelectasis. The right lung base opacity is likely atelectasis. Is no overt pulmonary edema. No pneumothorax. IMPRESSION: 1. Bibasilar lung opacity is similar to the most recent prior exam. This is consistent with a combination a left pleural effusion and bilateral lung base atelectasis. 2. No overt pulmonary edema.  No pneumothorax. Electronically Signed   By: Lajean Manes M.D.   On: 11/06/2015 13:24   Dg Chest 2 View  Result Date: 11/04/2015 CLINICAL DATA:  Hypoxia, dizziness EXAM: CHEST  2 VIEW COMPARISON:  11/02/2015 FINDINGS: Status post median sternotomy and cardiac valve replacement. Cardiomediastinal silhouette is stable. Stable title again noted some right perihilar scarring. Bilateral small pleural effusion with bilateral basilar atelectasis or infiltrate. No pulmonary edema. IMPRESSION: No pulmonary edema. Bilateral small pleural effusion with bilateral basilar atelectasis or infiltrate. Electronically Signed   By: Lahoma Crocker M.D.   On: 11/04/2015 12:51   Dg Abd 1 View  Result Date: 11/14/2015 CLINICAL DATA:  Medical hx: cancer, diabetes, hypertension. Patient is lightheaded with limited mobility. EXAM: ABDOMEN - 1 VIEW COMPARISON:  None. FINDINGS: Normal bowel gas pattern. No evidence of renal or ureteral stones. Scattered aortic and iliac artery vascular calcifications. Soft tissues are otherwise unremarkable. No significant bony abnormality. IMPRESSION: No acute findings. Electronically Signed   By: Lajean Manes M.D.   On: 11/14/2015 09:28   Ct Head Wo Contrast  Result Date: 11/24/2015 CLINICAL DATA:  Headache and dizziness since bypass surgery on 10/29/2015. EXAM: CT HEAD WITHOUT CONTRAST TECHNIQUE: Contiguous axial images were obtained from the base of the skull through the vertex without intravenous contrast. COMPARISON:  11/13/2015 FINDINGS: Brain: The ventricles  are normal in size and configuration. No extra-axial fluid collections are identified. The gray-white differentiation is normal. No CT findings for acute intracranial process such as hemorrhage or infarction. No mass lesions. The brainstem and cerebellum are grossly normal. Vascular: Minimal scattered vascular calcifications. No aneurysm or hyperdense vessels. Skull: No skull fracture or bone lesions. Sinuses/Orbits: The paranasal sinuses and mastoid air cells are clear. The globes are intact. Other: No scalp lesion or hematoma. IMPRESSION: Normal and stable head CT. Electronically Signed   By: Marijo Sanes  M.D.   On: 11/24/2015 15:56   Ct Head Wo Contrast  Result Date: 11/13/2015 CLINICAL DATA:  Nausea, vomiting, and diarrhea for 2 weeks. Near syncope. EXAM: CT HEAD WITHOUT CONTRAST TECHNIQUE: Contiguous axial images were obtained from the base of the skull through the vertex without intravenous contrast. COMPARISON:  None. FINDINGS: Brain: No subdural, epidural, or subarachnoid hemorrhage. Ventricles and sulci are normal. Cerebellum, brainstem, and basal cisterns are normal. No acute cortical ischemia or infarct. Vascular: Calcified atherosclerosis is seen in the intracranial portions of the carotid arteries. Skull: Normal. Negative for fracture or focal lesion. Sinuses/Orbits: No acute finding. Other: No other abnormalities. IMPRESSION: No acute intracranial abnormalities. Electronically Signed   By: Dorise Bullion III M.D   On: 11/13/2015 11:56   Dg Chest Port 1 View  Result Date: 11/24/2015 CLINICAL DATA:  Fever and atrial fibrillation EXAM: PORTABLE CHEST 1 VIEW COMPARISON:  November 14, 2015 FINDINGS: There is airspace consolidation in the left lower lobe consistent with pneumonia. There is a small associated left pleural effusion. The lungs elsewhere are clear. Heart is upper normal in size with pulmonary vascularity within normal limits. The patient is status post aortic valve replacement. The  patient is also status post coronary artery bypass grafting. No adenopathy. There is evidence of old healed fracture of the left clavicle. IMPRESSION: Lobe airspace consolidation consistent with pneumonia. Small left pleural effusion. Lungs elsewhere clear. Stable cardiac silhouette. Followup PA and lateral chest radiographs recommended in 3-4 weeks following trial of antibiotic therapy to ensure resolution and exclude underlying malignancy. Electronically Signed   By: Lowella Grip III M.D.   On: 11/24/2015 14:00   US Thoracentesis Asp Pleural Space W/img Guide  Result Date: 11/05/2015 INDICATION: Reason CABG. Right-sided pleural effusion. Request therapeutic thoracentesis. EXAM: ULTRASOUND GUIDED RIGHT THORACENTESIS MEDICATIONS: None. COMPLICATIONS: None immediate. PROCEDURE: An ultrasound guided thoracentesis was thoroughly discussed with the patient and questions answered. The benefits, risks, alternatives and complications were also discussed. The patient understands and wishes to proceed with the procedure. Written consent was obtained. Ultrasound was performed to localize and mark an adequate pocket of fluid in the right chest. The area was then prepped and draped in the normal sterile fashion. 1% Lidocaine was used for local anesthesia. Under ultrasound guidance a Safe-T-Centesis catheter was introduced. Thoracentesis was performed. The catheter was removed and a dressing applied. FINDINGS: A total of approximately 700 mL of bloody pleural fluid was removed. IMPRESSION: Successful ultrasound guided right thoracentesis yielding 700 mL of pleural fluid. Read by: Ascencion Dike PA-C Electronically Signed   By: Corrie Mckusick D.O.   On: 11/05/2015 15:13     LOS: 2 days   Oren Binet, MD  [facility] Pager:[phone]  If 7PM-7AM, please contact night-coverage [email or web address] Password [email or web address] 12/02/2015, 1:38 PM

## 2015-12-02 NOTE — H&P (View-Only) (Signed)
Referring Provider: Dr. Sloan Leiter Primary Care Physician:  [name], NP Primary Gastroenterologist:  Dr. Hilarie Fredrickson  Reason for Consultation:  Nausea and vomiting  HPI: [name] is a 70 y.o. male with medical history of aortic stenosis, CAD, type II DM, essential hypertension, hyperlipidemia.  Recently discharged from the hospital on 11/08/15 status post 4 vessel CABG and AVR ([employer] pericardial tissue valve) for management of CAD and aortic stenosis. Postoperative course was complicated by PAF, which was treated with amiodarone and Xarelto.The patient was also recently discharged on 11/20/2015 after having nine-day hospital stay for which he was treated for orthostatic hypotension, nausea and vomiting, and UTI. The patient was discharged home with amiodarone and rivaroxaban. In addition, Midodrinewas started on 11/18/2015 and titrated up to 10 mg 3 times a day. In addition, the patient's ARB, diuretics, and beta blockers were discontinued due to his orthostasis.  Most recently, the patient had another admission from 11/24/2015 through 11/27/2015 secondary to recurrent nausea and vomiting and UTI. The patient was discharged home with levofloxacin. During that hospitalization, the patient had atrial fib with RVR, and his amiodarone dose was increased.  He presents again now with one-day history of nausea and vomiting that began on the evening of 11/29/2015 after he ate dinner. The patient had 2 additional episodes of emesis on the morning of 11/30/2015, which prompted him to come to the emergency department.  He again appears to have a UTI.  In the emergency department, the patient was afebrile and hemodynamically stable with oxygen saturation 99-100 percent room air. WBC was 11.6 and serum creatinine was 1.08. Because of recurrent UTI and recurrent vomiting with dehydration, the patient was admitted for further evaluation.  CT scan abdomen and pelvis without contrast showed  the following:  IMPRESSION: 1. Urinary bladder diffuse wall thickening may reflect cystitis. 2. Nonobstructive 3 mm left kidney lower pole nonobstructive calculus. 3. Left lower lobe airspace opacity partially from atelectasis but partially from airspace filling process, pneumonia not excluded. Small left pleural effusion. 4. Postoperative findings including recent median sternotomy and small pericardial effusion. Two of the 3 visualized lower sternotomy wires only capture the edge of the left side of the sternotomy; this is probably incidental and dehiscence is considered unlikely, but correlate with any symptoms directly related to the sternum. 5. Dependent density in the gallbladder from sludge or gallstones. 6. Mild prominence of stool in the rectum, probably incidental. 7.  Aortoiliac atherosclerotic vascular disease. 8. Lumbar spondylosis and degenerative disc disease causing foraminal impingement at L3-4, L4-5, and L5-S1 (assumes transitional S1 vertebra). 9. Direct left inguinal hernia contains adipose tissue. 10. Degenerative arthropathy of both hips.  He's also had a CT scan of the head without contrast, which was unremarkable.  Last Hgb A1C in September was 11.2.  He is not on an acid medication regularly at home.  No NSAID's.  Says that the vomiting sometimes occurs just as his is starting to eat and sometimes wakes up from sleep and starts vomiting.  Denies any other GI complaints.    Past Medical History:  Diagnosis Date  . 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 Carpal tunnel syndrome 09/24/2014   Bilateral  . 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 Diabetes mellitus, type 2 ([place])   . Erectile dysfunction   . Essential hypertension   . Hyperlipidemia     Past Surgical History:  Procedure Laterality Date  .  AORTIC VALVE REPLACEMENT N/A 10/29/2015   Procedure: AORTIC VALVE REPLACEMENT (AVR) WITH 23MM MAGNA EASE  TISSUE VALVE.;  Surgeon: [name], MD;  Location: [place];  Service: Open Heart Surgery;  Laterality: N/A;  . CARDIAC CATHETERIZATION N/A 10/27/2015   Procedure: Left Heart Cath and Coronary Angiography;  Surgeon: [name], MD;  Location: [facility];  Service: Cardiovascular;  Laterality: N/A;  . CORONARY ARTERY BYPASS GRAFT N/A 10/29/2015   Procedure: CORONARY ARTERY BYPASS GRAFTING (CABG) x Four UTILIZING THE LEFT INTERNAL MAMMARY ARTERY AND ENDOSCOPICALLY HARVESTED RIGHT SAPEHENEOUS VEINS.;  Surgeon: [name], MD;  Location: [place];  Service: Open Heart Surgery;  Laterality: N/A;  . CYST REMOVAL HAND Right   . REPLACEMENT ASCENDING AORTA N/A 10/29/2015   Procedure: REPLACEMENT OF ASCENDING AORTA USING 34MM X 30CM WOVEN DOUBLE VELOUR VASCULAR GRAFT.;  Surgeon: [name], MD;  Location: [place];  Service: Open Heart Surgery;  Laterality: N/A;  . TEE WITHOUT CARDIOVERSION N/A 10/29/2015   Procedure: TRANSESOPHAGEAL ECHOCARDIOGRAM (TEE);  Surgeon: [name], MD;  Location: [place];  Service: Open Heart Surgery;  Laterality: N/A;    Prior to Admission medications   Medication Sig Start Date End Date Taking? Authorizing Provider  amiodarone (PACERONE) 200 MG tablet Take 1 tablet (200 mg total) by mouth daily. Patient taking differently: Take 200 mg by mouth 2 (two) times daily.  11/23/15  Yes Thayer Headings, MD  atorvastatin (LIPITOR) 40 MG tablet Take 1 tablet (40 mg total) by mouth daily. 07/29/15  Yes Thayer Headings, MD  famotidine (PEPCID AC) 10 MG chewable tablet Chew 1 tablet (10 mg total) by mouth 2 (two) times daily as needed for heartburn. 10/11/15  Yes Thayer Headings, MD  ferrous gluconate (FERGON) 324 MG tablet Take 1 tablet (324 mg total) by mouth 2 (two) times daily with a meal. 11/08/15  Yes Wayne E Gold, PA-C  folic acid (FOLVITE) 1 MG tablet Take 1 tablet (1 mg total) by mouth daily. 11/08/15  Yes Wayne E Gold, PA-C  Insulin Glargine (LANTUS  SOLOSTAR) 100 UNIT/ML Solostar Pen Inject 20 Units into the skin daily at 10 pm. 11/27/15  Yes Reyne Dumas, MD  insulin lispro (HUMALOG KWIKPEN) 100 UNIT/ML KiwkPen Use 5 to 10 units three times daily 10-15 minutes before meals Patient taking differently: Inject 5-10 Units into the skin 3 (three) times daily before meals.  09/09/14  Yes Doe-Hyun R Shawna Orleans, DO  levofloxacin (LEVAQUIN) 750 MG tablet Take 1 tablet (750 mg total) by mouth daily. 11/28/15 12/03/15 Yes Reyne Dumas, MD  metFORMIN (GLUCOPHAGE) 1000 MG tablet Take 1 tablet (1,000 mg total) by mouth 2 (two) times daily with a meal. 09/07/15  Yes [name], NP  midodrine (PROAMATINE) 10 MG tablet Take 1 tablet (10 mg total) by mouth 3 (three) times daily with meals. 11/20/15  Yes Modena Jansky, MD  ondansetron (ZOFRAN ODT) 4 MG disintegrating tablet Take 1 tablet (4 mg total) by mouth every 8 (eight) hours as needed for nausea or vomiting. 11/11/15  Yes [name], MD  rivaroxaban (XARELTO) 20 MG TABS tablet Take 1 tablet (20 mg total) by mouth daily with supper. 11/08/15  Yes Erin R [name], PA-C  saccharomyces boulardii (FLORASTOR) 250 MG capsule Take 1 capsule (250 mg total) by mouth 2 (two) times daily. 11/27/15 12/07/15 Yes Reyne Dumas, MD  B-D ULTRAFINE III SHORT PEN 31G X 8 MM MISC USE 4 TIMES DAILY AS DIRECTED 11/30/15  [name], NP  glucose blood test strip Use as instructed 12/25/14   [name], NP  oxyCODONE (OXY IR/ROXICODONE) 5 MG immediate release tablet Take 1-2 tablets (5-10 mg total) by mouth every 6 (six) hours as needed for severe pain. 11/08/15   John Giovanni, PA-C    Current Facility-Administered Medications  Medication Dose Route Frequency Provider Last Rate Last Dose  . 0.9 % NaCl with KCl 20 mEq/ L  infusion   Intravenous Continuous Jonetta Osgood, MD 75 mL/hr at 12/01/15 1532    . acetaminophen (TYLENOL) tablet 650 mg  650 mg Oral Q6H PRN Orson Eva, MD       Or  . acetaminophen (TYLENOL)  suppository 650 mg  650 mg Rectal Q6H PRN Orson Eva, MD      . amiodarone (PACERONE) tablet 200 mg  200 mg Oral BID Orson Eva, MD   200 mg at 12/01/15 1315  . atorvastatin (LIPITOR) tablet 40 mg  40 mg Oral Daily Orson Eva, MD   40 mg at 12/01/15 1315  . cefTRIAXone (ROCEPHIN) 1 g in dextrose 5 % 50 mL IVPB  1 g Intravenous Q24H Orson Eva, MD   1 g at 11/30/15 2132  . [START ON 12/02/2015] cosyntropin (CORTROSYN) injection 0.25 mg  0.25 mg Intravenous Once Shanker Kristeen Mans, MD      . insulin aspart (novoLOG) injection 0-5 Units  0-5 Units Subcutaneous QHS David Tat, MD      . insulin aspart (novoLOG) injection 0-9 Units  0-9 Units Subcutaneous TID WC Orson Eva, MD   1 Units at 12/01/15 [phone]  . insulin glargine (LANTUS) injection 8 Units  8 Units Subcutaneous QHS Orson Eva, MD   8 Units at 11/30/15 2153  . iopamidol (ISOVUE-300) 61 % injection           . metoCLOPramide (REGLAN) injection 5 mg  5 mg Intravenous TID AC Shanker Kristeen Mans, MD      . midodrine (PROAMATINE) tablet 10 mg  10 mg Oral TID WC Orson Eva, MD      . ondansetron [facility]) tablet 4 mg  4 mg Oral Q6H PRN Orson Eva, MD       Or  . ondansetron (ZOFRAN) injection 4 mg  4 mg Intravenous Q6H PRN Orson Eva, MD      . Derrill Memo ON 12/02/2015] pantoprazole (PROTONIX) EC tablet 40 mg  40 mg Oral Q1200 Jonetta Osgood, MD      . rivaroxaban [facility]veda Reasons) tablet 20 mg  20 mg Oral Q supper Shanon Brow Tat, MD      . saccharomyces boulardii (FLORASTOR) capsule 250 mg  250 mg Oral BID Orson Eva, MD   250 mg at 12/01/15 1315  . sodium chloride flush (NS) 0.9 % injection 3 mL  3 mL Intravenous Q12H Orson Eva, MD   3 mL at 12/01/15 1214    Allergies as of 11/30/2015 - Review Complete 11/30/2015  Allergen Reaction Noted  . No known allergies  10/28/2015    Family History  Problem Relation Age of Onset  . Cancer Father     Throat cancer  . Diabetes Sister     Social History   Social History  . Marital status: Married    Spouse name: N/A  .  Number of children: N/A  . Years of education: N/A   Occupational History  . Not on file.   Social History Main Topics  . Smoking status: Never Smoker  . Smokeless tobacco: Never Used  .  Alcohol use No  . Drug use: No  . Sexual activity: Yes   Other Topics Concern  . Not on file   Social History Narrative   Married to [name] for 66 years   Retired as Chiropodist   5 children   Never Smoked   Alcohol use- no   Caffeine use: occasional     Review of Systems: Ten point ROS is O/W negative except as mentioned in HPI.  Physical Exam: Vital signs in last 24 hours: Temp:  [98.2 F (36.8 C)-99.7 F (37.6 C)] 98.2 F (36.8 C) (10/18 1000) Pulse Rate:  [77-89] 77 (10/18 1000) Resp:  [16-25] 20 (10/18 1000) BP: (120-137)/(62-74) 121/62 (10/18 1000) SpO2:  [96 %-99 %] 98 % (10/18 1000) Weight:  [185 lb 12.8 oz (84.3 kg)] 185 lb 12.8 oz (84.3 kg) (10/17 2044) Last BM Date: 11/29/15 General:  Alert, Well-developed, well-nourished, pleasant and cooperative in NAD Head:  Normocephalic and atraumatic. Eyes:  Sclera clear, no icterus.  Conjunctiva pink. Ears:  Normal auditory acuity. Mouth:  No deformity or lesions.   Lungs:  Clear throughout to auscultation.  No wheezes, crackles, or rhonchi. Heart:  Regular rate and rhythm; no murmurs, clicks, rubs, or gallops. Abdomen:  Soft, non-distended.  BS present.  Non-tender. Rectal:  Deferred  Msk:  Symmetrical without gross deformities. Pulses:  Normal pulses noted. Extremities:  Without clubbing or edema. Neurologic:  Alert and oriented x 4;  grossly normal neurologically. Skin:  Intact without significant lesions or rashes. Psych:  Alert and cooperative. Normal mood and affect.  Intake/Output from previous day: 10/17 0701 - 10/18 0700 In: 1986.3 [P.O.:180; I.V.:756.3; IV Piggyback:1050] Out: 600 [Urine:600] Intake/Output this shift: Total I/O In: 0  Out: 800 [Urine:800]  Lab Results:  Recent Labs   11/30/15 1127 12/01/15 0750  WBC 11.6* 8.7  HGB 11.2* 10.0*  HCT 34.7* 30.5*  PLT 342 305   BMET  Recent Labs  11/30/15 1127 12/01/15 0750  NA 137 138  K 4.4 4.4  CL 100* 104  CO2 27 26  GLUCOSE 155* 124*  BUN 5* 5*  CREATININE 1.08 0.96  CALCIUM 9.8 9.4   LFT  Recent Labs  11/30/15 1127  PROT 7.9  ALBUMIN 3.4*  AST 21  ALT 18  ALKPHOS 82  BILITOT 0.5   Studies/Results: Ct Abdomen Pelvis Wo Contrast  Result Date: 12/01/2015 CLINICAL DATA:  Chronic nausea and vomiting. Prior CABG last month. Recurrent urinary tract infection. EXAM: CT ABDOMEN AND PELVIS WITHOUT CONTRAST TECHNIQUE: Multidetector CT imaging of the abdomen and pelvis was performed following the standard protocol without IV contrast. COMPARISON:  Abdominal radiograph of 11/14/2015 FINDINGS: Lower chest: Left lower lobe airspace opacity primarily from atelectasis but possibly with superimposed airspace filling process such as pneumonia. Small left pleural effusion, nonspecific for transudative or exudative etiology. Mild atelectasis in the right lower lobe. Small pericardial effusion. Aortic valve prosthesis. Coronary artery atherosclerotic calcification and prior median sternotomy. The sternotomy is still visible with about 3 mm of bony separation at the lower sternotomy site. Several of the lower sternotomy wires capture only the very margin of the left side of the sternotomy. Hepatobiliary: Dependent density in the gallbladder otherwise unremarkable. Pancreas: Unusual pancreatic contour which is smooth but thin, with little stippling of the pancreatic parenchyma ; appearance not changed from 08/27/2015. Spleen: Unremarkable Adrenals/Urinary Tract: 3 mm left kidney lower pole nonobstructive calculus. No hydronephrosis. Thick-walled urinary bladder diffusely. Stomach/Bowel: Mild prominence of stool in the rectum. Orally administered  contrast extends down through to the rectum. Appendix normal. Vascular/Lymphatic:  Aortoiliac atherosclerotic vascular disease. No pathologic adenopathy identified. Reproductive: Unremarkable Other: Unremarkable Musculoskeletal: A transitional lumbosacral vertebra is assumed to represent the S1 level. Careful correlation with this numbering strategy prior to any procedural intervention would be recommended. Lumbar spondylosis and degenerative disc disease cause bilateral foraminal stenosis at L3-4, L4-5, and L5-S1. Direct left inguinal hernia contains adipose tissue. There is degenerative arthropathy of both hips. IMPRESSION: 1. Urinary bladder diffuse wall thickening may reflect cystitis. 2. Nonobstructive 3 mm left kidney lower pole nonobstructive calculus. 3. Left lower lobe airspace opacity partially from atelectasis but partially from airspace filling process, pneumonia not excluded. Small left pleural effusion. 4. Postoperative findings including recent median sternotomy and small pericardial effusion. Two of the 3 visualized lower sternotomy wires only capture the edge of the left side of the sternotomy ; this is probably incidental and dehiscence is considered unlikely, but correlate with any symptoms directly related to the sternum. 5. Dependent density in the gallbladder from sludge or gallstones. 6. Mild prominence of stool in the rectum, probably incidental. 7.  Aortoiliac atherosclerotic vascular disease. 8. Lumbar spondylosis and degenerative disc disease causing foraminal impingement at L3-4, L4-5, and L5-S1 (assumes transitional S1 vertebra). 9. Direct left inguinal hernia contains adipose tissue. 10. Degenerative arthropathy of both hips. Electronically Signed   By: Van Clines M.D.   On: 12/01/2015 14:07   Dg Chest 2 View  Result Date: 11/30/2015 CLINICAL DATA:  Left chest wall pain EXAM: CHEST  2 VIEW COMPARISON:  11/24/2015 FINDINGS: Aortic valve replacement and CABG again noted. Heart size mildly enlarged. Negative for heart failure Left lower lobe consolidation  unchanged. Minimal right lower lobe airspace disease is improved. Probable small left effusion IMPRESSION: Left lower lobe consolidation unchanged. Mild right lower lobe airspace disease slightly improved. Electronically Signed   By: Franchot Gallo M.D.   On: 11/30/2015 12:33   IMPRESSION:  -Recurrent nausea and vomiting:  Since cardiac surgery in September.  ? Etiology.  Llikely has underlying gastroparesis from his uncontrolled DM that has been worsened by recent surgery.  ? If this is related to his recurrent UTI's.  ? If side effect of new med (amiodarone or Xarelto).  ? Ulcer or acid related.  CT scan head as well as abd/pelvis negative for source.   -Paroxysmal atrial fibrillation:  CHADSVASC = 4.  On amiodarone and Xarelto. -S/P bioprosthetic AVR, aortic root replacement & CABG (10/2015) -Type 2 DM:  Poorly controlled with A1C 11.2 one month ago -UTI:  Recurrent.  Currently on treatment.  PLAN: -Will hold Xarelto today and proceed with EGD tomorrow, 10/18 to rule out ulcer, etc. -Will discontinue Reglan for now in case he would need gastric emptying scan.  Will change to zofran 4 mg IV ATC every 6 hours. -Continue pantoprazole 40 mg daily.  Alianys Chacko D.  12/01/2015, 3:57 PM  Pager number [phone]

## 2015-12-02 NOTE — Interval H&P Note (Signed)
History and Physical Interval Note:  12/02/2015 9:18 AM  [name]  has presented today for surgery, with the diagnosis of Recurrent nausea and vomiting  The various methods of treatment have been discussed with the patient and family. After consideration of risks, benefits and other options for treatment, the patient has consented to  Procedure(s): ESOPHAGOGASTRODUODENOSCOPY (EGD) (N/A) as a surgical intervention .  The patient's history has been reviewed, patient examined, no change in status, stable for surgery.  I have reviewed the patient's chart and labs.  Questions were answered to the patient's satisfaction.     [name]

## 2015-12-02 NOTE — Op Note (Signed)
[facility] Patient Name: [name] Procedure Date : 12/02/2015 MRN: [id number] Attending MD: Carlota Raspberry. Armbruster MD, MD Date of Birth: 1945/05/19 CSN: [id number] Age: 70 Admit Type: Inpatient Procedure:                Upper GI endoscopy Indications:              persistent nausea with vomiting Providers:                Remo Lipps P. Armbruster MD, MD, Cleda Daub, RN,                            [facility], Technician Referring MD:              Medicines:                Fentanyl 25 micrograms IV, Midazolam 3 mg IV,                            Diphenhydramine 25 mg IV Complications:            No immediate complications. Estimated blood loss:                            None. Estimated Blood Loss:     Estimated blood loss: none. Procedure:                Pre-Anesthesia Assessment:                           - Prior to the procedure, a History and Physical                            was performed, and patient medications and                            allergies were reviewed. The patient's tolerance of                            previous anesthesia was also reviewed. The risks                            and benefits of the procedure and the sedation                            options and risks were discussed with the patient.                            All questions were answered, and informed consent                            was obtained. Prior Anticoagulants: The patient has                            taken Xarelto (rivaroxaban), last dose was 2 days  prior to procedure. ASA Grade Assessment: III - A                            patient with severe systemic disease. After                            reviewing the risks and benefits, the patient was                            deemed in satisfactory condition to undergo the                            procedure.                           After obtaining informed consent, the endoscope was                            passed under direct vision. Throughout the                            procedure, the patient's blood pressure, pulse, and                            oxygen saturations were monitored continuously. The                            [id number]) scope was introduced through the                            mouth, and advanced to the second part of duodenum.                            The upper GI endoscopy was accomplished without                            difficulty. The patient tolerated the procedure                            well. Scope In: Scope Out: Findings:      Esophagogastric landmarks were identified: the Z-line was found at 41       cm, the gastroesophageal junction was found at 41 cm and the upper       extent of the gastric folds was found at 44 cm from the incisors.      A 3 cm hiatal hernia was present.      The exam of the esophagus was otherwise normal.      A medium-sized paraesophageal hernia was found on retroflexion. There       was some retained food remnants within the hernia sac which could not be       completely visualized, but mucosa appeared normal.      The exam of the stomach was otherwise normal.      The duodenal bulb and second portion of the duodenum were normal. Impression:               -  Esophagogastric landmarks identified.                           - 3 cm hiatal hernia.                           - Medium-sized paraesophageal hernia.                           - Normal stomach                           - Normal duodenal bulb and second portion of the                            duodenum.                           Overall, no clear anatomic cause for the patient's                            nausea and vomiting which may be multifactorial -                            polypharmacy, recurrent UTI / infections, and also                            possible he has gastroparesis. Moderate Sedation:      Moderate (conscious)  sedation was administered by the endoscopy nurse       and supervised by the endoscopist. The following parameters were       monitored: oxygen saturation, heart rate, blood pressure, and response       to care. Total physician intraservice time was 15 minutes. Recommendation:           - Return patient to hospital ward for ongoing care.                           - Resume regular diet.                           - Continue present medications - standing Zofran                           - If the patient's symptoms are controlled with                            Zofran (he reports they are at this time), and if                            he can tolerate a diet, from GI perspective he can                            be discharged on standing Zofran and continue this  workup as outpatient, in which a gastric emptying                            study is recommended to rule out gastroparesis                           - If the patient remains inpatient, recommend                            gastric emptying study tomorrow to assess for                            gastroparesis. Avoid narcotics / reglan today in                            this light                           - Resume Xarelto                           - Please call with questions / concerns. Procedure Code(s):        --- Professional ---                           [id number], Esophagogastroduodenoscopy, flexible,                            transoral; diagnostic, including collection of                            specimen(s) by brushing or washing, when performed                            (separate procedure)                           99152, Moderate sedation services provided by the                            same physician or other qualified health care                            professional performing the diagnostic or                            therapeutic service that the sedation supports,                             requiring the presence of an independent trained                            observer to assist in the monitoring of the                            patient's  level of consciousness and physiological                            status; initial 15 minutes of intraservice time,                            patient age 63 years or older Diagnosis Code(s):        --- Professional ---                           K44.9, Diaphragmatic hernia without obstruction or                            gangrene                           R11.2, Nausea with vomiting, unspecified CPT copyright 2016 [employer]. All rights reserved. The codes documented in this report are preliminary and upon coder review may  be revised to meet current compliance requirements. Remo Lipps P. Armbruster MD, MD 12/02/2015 10:47:09 AM This report has been signed electronically. Number of Addenda: 0

## 2015-12-03 ENCOUNTER — Ambulatory Visit (HOSPITAL_COMMUNITY): Payer: Self-pay | Admitting: Nurse Practitioner

## 2015-12-03 ENCOUNTER — Inpatient Hospital Stay (HOSPITAL_COMMUNITY): Payer: Medicare Other

## 2015-12-03 ENCOUNTER — Encounter (HOSPITAL_COMMUNITY): Payer: Self-pay | Admitting: Gastroenterology

## 2015-12-03 LAB — GLUCOSE, CAPILLARY
GLUCOSE-CAPILLARY: 152 mg/dL — AB (ref 65–99)
Glucose-Capillary: 187 mg/dL — ABNORMAL HIGH (ref 65–99)

## 2015-12-03 MED ORDER — PANTOPRAZOLE SODIUM 40 MG PO TBEC: 40.0000 mg | DELAYED_RELEASE_TABLET | Freq: Every day | ORAL | 0 refills | Status: DC

## 2015-12-03 MED ORDER — ONDANSETRON 4 MG PO TBDP: 4.0000 mg | ORAL_TABLET | Freq: Three times a day (TID) | ORAL | 0 refills | Status: DC | PRN

## 2015-12-03 MED ORDER — TECHNETIUM TC 99M SULFUR COLLOID
2.0000 | Freq: Once | INTRAVENOUS | Status: AC | PRN
  Administered 2015-12-03: 2 via ORAL

## 2015-12-03 NOTE — Care Management Note (Signed)
Case Management Note  Patient Details  Name: [name] MRN: [id number] Date of Birth: [birth year]  Subjective/Objective:    CM following for progression and d/c planning.                 Action/Plan: 12/03/2015 Pt active with [facility] for [facility] and HHPT. [facility] to resume services. No other d/c needs identified at this time.   Expected Discharge Date:                  Expected Discharge Plan:  [place]  In-House Referral:  NA  Discharge planning Services  CM Consult  Post Acute Care Choice:  Home Health Choice offered to:  Patient  DME Arranged:  N/A DME Agency:  NA  HH Arranged:  RN, PT HH Agency:  [facility]  Status of Service:  Completed, signed off  If discussed at [place] of Stay Meetings, dates discussed:    Additional Comments:  Adron Bene, RN 12/03/2015, 11:02 AM

## 2015-12-03 NOTE — Discharge Summary (Addendum)
PATIENT DETAILS Name: [name] Age: 70 y.o. Sex: male Date of Birth: Oct 26, 1945 MRN: [id number]. Admitting Physician: Orson Eva, MD [id number] Nafziger, NP  Admit Date: 11/30/2015 Discharge date: 12/03/2015  Recommendations for Outpatient Follow-up:  1. Follow up with PCP in 1-2 weeks 2. Ensure follow-up with gastroenterology 3. Please obtain BMP/CBC in one week   Admitted From:  Home with home health services  Disposition: Home with home health [place]:  Yes  Equipment/Devices: None  Discharge Condition: Stable  CODE STATUS: FULL CODE  Diet recommendation:  Heart Healthy / Carb Modified  Brief Summary: See H&P, Labs, Consult and Test reports for all details in brief, Patient is a 70 y.o. male with recent history of 4 vessel CABG and bioprosthetic aortic valve replacement in September 2015, following which he has had 2 hospitalizations for vomiting, orthostatic hypotension and UTI-admitted again on 10/17 for recurrent vomiting.  Brief Hospital Course: Vomiting: No vomiting since admission. Diet has been advanced to regular diet at the time of discharge. Etiology remains unknown.Recurrent/ongoing-third admission this month alone. CT of the abdomen with no evidence of gastric outlet obstruction. GI consulted, underwent endoscopy without any findings that would account for recurrent vomiting. Subsequently underwent gastric emptying study that is negative as well. Abdomen is benign, no further vomiting since admission, spoke with GI PA Fabienne Bruns to discharge, and GI will arrange for outpatient follow-up. If vomiting recurs in the future, may need to do a HIDA scan with EF to rule out biliary dyskinesia-although vomiting is not related to oral intake.   ? UTI: Although UA somewhat suggestive of UTI-Urine culture on 10/17 negative-patient was briefly placed on Rocephin, however this is since been discontinued. CT of the abdomen without any major  structural abnormality.  Pulmonary infiltrate: Doubt pneumonia-suspect just atelectasis-as no symptoms of pneumonia at all. Furthermore pro-calcitonin essentially negative.   Orthostatic hypotension: Apparently developed post CABG-off all antihypertensives. Continue Midodrine. ACTH stimulation test negative.Have counseled regarding measures to minimize hissymptoms from orthostatic hypotension. Gradual transition from supine to sitting to standing and ambulating, adequate hydration, increasing salt intake, elevating legs up when he is sitting, where TED hose when ambulating. He verbalizes understanding.  Type 2 diabetes: CBGs relatively stable, continue Lantus and SSI.  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)  Procedures/Studies: EGD   Discharge Diagnoses:  Active Problems:   Nausea and vomiting   Pyelonephritis   Coronary artery disease involving coronary bypass graft of native heart without angina pectoris   Recurrent UTI    Discharge Instructions:  Activity:  As tolerated with Full fall precautions use walker/cane & assistance as needed  Discharge Instructions    Call MD for:  persistant nausea and vomiting    Complete by:  As directed    Diet - low sodium heart healthy    Complete by:  As directed    Diet Carb Modified    Complete by:  As directed    Increase activity slowly    Complete by:  As directed        Medication List    STOP taking these medications   levofloxacin 750 MG tablet Commonly known as:  LEVAQUIN   oxyCODONE 5 MG immediate release tablet Commonly known as:  Oxy IR/ROXICODONE     TAKE these medications   amiodarone 200 MG tablet Commonly known as:  PACERONE Take 1 tablet (200 mg total) by mouth daily.  What changed:  when to take this   atorvastatin 40 MG tablet Commonly known as:  LIPITOR Take 1 tablet (40 mg total) by mouth  daily.   B-D ULTRAFINE III SHORT PEN 31G X 8 MM Misc Generic drug:  Insulin Pen Needle USE 4 TIMES DAILY AS DIRECTED   famotidine 10 MG chewable tablet Commonly known as:  PEPCID AC Chew 1 tablet (10 mg total) by mouth 2 (two) times daily as needed for heartburn.   ferrous gluconate 324 MG tablet Commonly known as:  FERGON Take 1 tablet (324 mg total) by mouth 2 (two) times daily with a meal.   folic acid 1 MG tablet Commonly known as:  FOLVITE Take 1 tablet (1 mg total) by mouth daily.   glucose blood test strip Use as instructed   Insulin Glargine 100 UNIT/ML Solostar Pen Commonly known as:  LANTUS SOLOSTAR Inject 20 Units into the skin daily at 10 pm.   insulin lispro 100 UNIT/ML KiwkPen Commonly known as:  HUMALOG KWIKPEN Use 5 to 10 units three times daily 10-15 minutes before meals What changed:  how much to take  how to take this  when to take this  additional instructions   metFORMIN 1000 MG tablet Commonly known as:  GLUCOPHAGE Take 1 tablet (1,000 mg total) by mouth 2 (two) times daily with a meal.   midodrine 10 MG tablet Commonly known as:  PROAMATINE Take 1 tablet (10 mg total) by mouth 3 (three) times daily with meals.   ondansetron 4 MG disintegrating tablet Commonly known as:  ZOFRAN ODT Take 1 tablet (4 mg total) by mouth every 8 (eight) hours as needed for nausea or vomiting.   pantoprazole 40 MG tablet Commonly known as:  PROTONIX Take 1 tablet (40 mg total) by mouth daily at 12 noon.   rivaroxaban 20 MG Tabs tablet Commonly known as:  XARELTO Take 1 tablet (20 mg total) by mouth daily with supper.   saccharomyces boulardii 250 MG capsule Commonly known as:  FLORASTOR Take 1 capsule (250 mg total) by mouth 2 (two) times daily.      Follow-up Information    [name], NP. Schedule an appointment as soon as possible for a visit in 1 week(s).   Specialty:  Family Medicine Contact information: [place]  [place] [address] [phone]        Tye Savoy, NP. Schedule an appointment as soon as possible for a visit in 2 week(s).   Specialty:  Gastroenterology Contact information: [place] [address] [phone]          Allergies  Allergen Reactions  . No Known Allergies     Consultations:   GI   Other Procedures/Studies: Ct Abdomen Pelvis Wo Contrast  Result Date: 12/01/2015 CLINICAL DATA:  Chronic nausea and vomiting. Prior CABG last month. Recurrent urinary tract infection. EXAM: CT ABDOMEN AND PELVIS WITHOUT CONTRAST TECHNIQUE: Multidetector CT imaging of the abdomen and pelvis was performed following the standard protocol without IV contrast. COMPARISON:  Abdominal radiograph of 11/14/2015 FINDINGS: Lower chest: Left lower lobe airspace opacity primarily from atelectasis but possibly with superimposed airspace filling process such as pneumonia. Small left pleural effusion, nonspecific for transudative or exudative etiology. Mild atelectasis in the right lower lobe. Small pericardial effusion. Aortic valve prosthesis. Coronary artery atherosclerotic calcification and prior median sternotomy. The sternotomy is still visible with about 3 mm of bony separation at the lower sternotomy site. Several of the lower sternotomy wires  capture only the very margin of the left side of the sternotomy. Hepatobiliary: Dependent density in the gallbladder otherwise unremarkable. Pancreas: Unusual pancreatic contour which is smooth but thin, with little stippling of the pancreatic parenchyma ; appearance not changed from 08/27/2015. Spleen: Unremarkable Adrenals/Urinary Tract: 3 mm left kidney lower pole nonobstructive calculus. No hydronephrosis. Thick-walled urinary bladder diffusely. Stomach/Bowel: Mild prominence of stool in the rectum. Orally administered contrast extends down through to the rectum. Appendix normal. Vascular/Lymphatic: Aortoiliac atherosclerotic vascular disease. No  pathologic adenopathy identified. Reproductive: Unremarkable Other: Unremarkable Musculoskeletal: A transitional lumbosacral vertebra is assumed to represent the S1 level. Careful correlation with this numbering strategy prior to any procedural intervention would be recommended. Lumbar spondylosis and degenerative disc disease cause bilateral foraminal stenosis at L3-4, L4-5, and L5-S1. Direct left inguinal hernia contains adipose tissue. There is degenerative arthropathy of both hips. IMPRESSION: 1. Urinary bladder diffuse wall thickening may reflect cystitis. 2. Nonobstructive 3 mm left kidney lower pole nonobstructive calculus. 3. Left lower lobe airspace opacity partially from atelectasis but partially from airspace filling process, pneumonia not excluded. Small left pleural effusion. 4. Postoperative findings including recent median sternotomy and small pericardial effusion. Two of the 3 visualized lower sternotomy wires only capture the edge of the left side of the sternotomy ; this is probably incidental and dehiscence is considered unlikely, but correlate with any symptoms directly related to the sternum. 5. Dependent density in the gallbladder from sludge or gallstones. 6. Mild prominence of stool in the rectum, probably incidental. 7.  Aortoiliac atherosclerotic vascular disease. 8. Lumbar spondylosis and degenerative disc disease causing foraminal impingement at L3-4, L4-5, and L5-S1 (assumes transitional S1 vertebra). 9. Direct left inguinal hernia contains adipose tissue. 10. Degenerative arthropathy of both hips. Electronically Signed   By: Van Clines M.D.   On: 12/01/2015 14:07   Dg Chest 1 View  Result Date: 11/05/2015 CLINICAL DATA:  Status post thoracentesis on the right EXAM: CHEST 1 VIEW COMPARISON:  November 04, 2015 FINDINGS: No pneumothorax evident. There has been interval resolution of pleural effusion on the right. A small left pleural effusion remains. There is bibasilar  atelectatic change with patchy airspace consolidation in the left base. Heart is borderline enlarged with pulmonary vascularity within normal limits. Patient is status post coronary artery bypass grafting and aortic valve replacement. No bone lesions. No demonstrable adenopathy. There is an old healed fracture of the left clavicle. IMPRESSION: No pneumothorax following thoracentesis on the right. Interval resolution of right pleural effusion. Small persistent left pleural effusion. Persistent left lower lobe region consolidation. Stable cardiac silhouette. Electronically Signed   By: Lowella Grip III M.D.   On: 11/05/2015 15:23   Dg Chest 2 View  Result Date: 11/30/2015 CLINICAL DATA:  Left chest wall pain EXAM: CHEST  2 VIEW COMPARISON:  11/24/2015 FINDINGS: Aortic valve replacement and CABG again noted. Heart size mildly enlarged. Negative for heart failure Left lower lobe consolidation unchanged. Minimal right lower lobe airspace disease is improved. Probable small left effusion IMPRESSION: Left lower lobe consolidation unchanged. Mild right lower lobe airspace disease slightly improved. Electronically Signed   By: Franchot Gallo M.D.   On: 11/30/2015 12:33   Dg Chest 2 View  Result Date: 11/14/2015 CLINICAL DATA:  Medical hx: cancer, diabetes, hypertension. Patient is lightheaded with limited mobility. EXAM: CHEST  2 VIEW COMPARISON:  11/06/2015 FINDINGS: The cardiac silhouette is mildly enlarged. Changes from cardiac surgery and valve replacement are stable. No mediastinal or hilar masses.  No convincing adenopathy.  There is left greater than right lung base opacity mildly improved from the prior study. This may reflect pneumonia, atelectasis or a combination. Minimal left pleural effusion. No pneumothorax. Remainder of the lungs is clear. IMPRESSION: 1. Mild improvement in left greater than right lung base opacity. Decreased left pleural fluid. Residual lung base opacity is likely atelectasis.  Pneumonia is possible. 2. No pulmonary edema.  No new abnormalities. Electronically Signed   By: Lajean Manes M.D.   On: 11/14/2015 09:27   Dg Chest 2 View  Result Date: 11/06/2015 CLINICAL DATA:  Pt states he had 2 valves replacements and an aortic aneurysm repair last week. Pt has no other symptoms or complaints and no other heart problems. Pt has diabetes. Pleural effusion, right EXAM: CHEST  2 VIEW COMPARISON:  11/05/2015 FINDINGS: Changes from cardiac surgery and valve replacement are stable. Cardiac silhouette is normal in size. No mediastinal or hilar masses. There is left greater than right lung base opacity. On the left, this is likely due to a combination of a small pleural effusion with atelectasis. The right lung base opacity is likely atelectasis. Is no overt pulmonary edema. No pneumothorax. IMPRESSION: 1. Bibasilar lung opacity is similar to the most recent prior exam. This is consistent with a combination a left pleural effusion and bilateral lung base atelectasis. 2. No overt pulmonary edema.  No pneumothorax. Electronically Signed   By: Lajean Manes M.D.   On: 11/06/2015 13:24   Dg Chest 2 View  Result Date: 11/04/2015 CLINICAL DATA:  Hypoxia, dizziness EXAM: CHEST  2 VIEW COMPARISON:  11/02/2015 FINDINGS: Status post median sternotomy and cardiac valve replacement. Cardiomediastinal silhouette is stable. Stable title again noted some right perihilar scarring. Bilateral small pleural effusion with bilateral basilar atelectasis or infiltrate. No pulmonary edema. IMPRESSION: No pulmonary edema. Bilateral small pleural effusion with bilateral basilar atelectasis or infiltrate. Electronically Signed   By: Lahoma Crocker M.D.   On: 11/04/2015 12:51   Dg Abd 1 View  Result Date: 11/14/2015 CLINICAL DATA:  Medical hx: cancer, diabetes, hypertension. Patient is lightheaded with limited mobility. EXAM: ABDOMEN - 1 VIEW COMPARISON:  None. FINDINGS: Normal bowel gas pattern. No evidence of renal or  ureteral stones. Scattered aortic and iliac artery vascular calcifications. Soft tissues are otherwise unremarkable. No significant bony abnormality. IMPRESSION: No acute findings. Electronically Signed   By: Lajean Manes M.D.   On: 11/14/2015 09:28   Ct Head Wo Contrast  Result Date: 11/24/2015 CLINICAL DATA:  Headache and dizziness since bypass surgery on 10/29/2015. EXAM: CT HEAD WITHOUT CONTRAST TECHNIQUE: Contiguous axial images were obtained from the base of the skull through the vertex without intravenous contrast. COMPARISON:  11/13/2015 FINDINGS: Brain: The ventricles are normal in size and configuration. No extra-axial fluid collections are identified. The gray-white differentiation is normal. No CT findings for acute intracranial process such as hemorrhage or infarction. No mass lesions. The brainstem and cerebellum are grossly normal. Vascular: Minimal scattered vascular calcifications. No aneurysm or hyperdense vessels. Skull: No skull fracture or bone lesions. Sinuses/Orbits: The paranasal sinuses and mastoid air cells are clear. The globes are intact. Other: No scalp lesion or hematoma. IMPRESSION: Normal and stable head CT. Electronically Signed   By: Marijo Sanes M.D.   On: 11/24/2015 15:56   Ct Head Wo Contrast  Result Date: 11/13/2015 CLINICAL DATA:  Nausea, vomiting, and diarrhea for 2 weeks. Near syncope. EXAM: CT HEAD WITHOUT CONTRAST TECHNIQUE: Contiguous axial images were obtained from the base of the skull through  the vertex without intravenous contrast. COMPARISON:  None. FINDINGS: Brain: No subdural, epidural, or subarachnoid hemorrhage. Ventricles and sulci are normal. Cerebellum, brainstem, and basal cisterns are normal. No acute cortical ischemia or infarct. Vascular: Calcified atherosclerosis is seen in the intracranial portions of the carotid arteries. Skull: Normal. Negative for fracture or focal lesion. Sinuses/Orbits: No acute finding. Other: No other abnormalities.  IMPRESSION: No acute intracranial abnormalities. Electronically Signed   By: Dorise Bullion III M.D   On: 11/13/2015 11:56   Nm Gastric Emptying  Result Date: 12/03/2015 CLINICAL DATA:  Diabetic with nausea and vomiting post CABG last month. EXAM: NUCLEAR MEDICINE GASTRIC EMPTYING SCAN TECHNIQUE: After oral ingestion of radiolabeled meal, sequential abdominal images were obtained for 4 hours. Percentage of activity emptying the stomach was calculated at 1 hour, 2 hour, 3 hour, and 4 hours. RADIOPHARMACEUTICALS:  2.0 mCi Tc-46m sulfur colloid in standardized meal COMPARISON:  Abdominal CT 12/01/2015. FINDINGS: Expected location of the stomach in the left upper quadrant. Ingested meal empties the stomach gradually over the course of the study. 20.4% emptied at 1 hr ( normal >= 10%) 40.4% emptied at 2 hr ( normal >= 40%) 92.2% emptied at 3 hr ( normal >= 70%) Study was terminated due to normal emptying at 3 hours. No 4 hour and teen calculated. IMPRESSION: Normal solid gastric emptying study. Electronically Signed   By: Richardean Sale M.D.   On: 12/03/2015 11:49   Dg Chest Port 1 View  Result Date: 11/24/2015 CLINICAL DATA:  Fever and atrial fibrillation EXAM: PORTABLE CHEST 1 VIEW COMPARISON:  November 14, 2015 FINDINGS: There is airspace consolidation in the left lower lobe consistent with pneumonia. There is a small associated left pleural effusion. The lungs elsewhere are clear. Heart is upper normal in size with pulmonary vascularity within normal limits. The patient is status post aortic valve replacement. The patient is also status post coronary artery bypass grafting. No adenopathy. There is evidence of old healed fracture of the left clavicle. IMPRESSION: Lobe airspace consolidation consistent with pneumonia. Small left pleural effusion. Lungs elsewhere clear. Stable cardiac silhouette. Followup PA and lateral chest radiographs recommended in 3-4 weeks following trial of antibiotic therapy to ensure  resolution and exclude underlying malignancy. Electronically Signed   By: Lowella Grip III M.D.   On: 11/24/2015 14:00   US Thoracentesis Asp Pleural Space W/img Guide  Result Date: 11/05/2015 INDICATION: Reason CABG. Right-sided pleural effusion. Request therapeutic thoracentesis. EXAM: ULTRASOUND GUIDED RIGHT THORACENTESIS MEDICATIONS: None. COMPLICATIONS: None immediate. PROCEDURE: An ultrasound guided thoracentesis was thoroughly discussed with the patient and questions answered. The benefits, risks, alternatives and complications were also discussed. The patient understands and wishes to proceed with the procedure. Written consent was obtained. Ultrasound was performed to localize and mark an adequate pocket of fluid in the right chest. The area was then prepped and draped in the normal sterile fashion. 1% Lidocaine was used for local anesthesia. Under ultrasound guidance a Safe-T-Centesis catheter was introduced. Thoracentesis was performed. The catheter was removed and a dressing applied. FINDINGS: A total of approximately 700 mL of bloody pleural fluid was removed. IMPRESSION: Successful ultrasound guided right thoracentesis yielding 700 mL of pleural fluid. Read by: Ascencion Dike PA-C Electronically Signed   By: Corrie Mckusick D.O.   On: 11/05/2015 15:13      TODAY-DAY OF DISCHARGE:  Subjective:   Chesley Mires today has no headache,no chest abdominal pain,no new weakness tingling or numbness, feels much better wants to go home today.  Objective:   Blood pressure 128/65, pulse 80, temperature 98.5 F (36.9 C), temperature source Oral, resp. rate 20, height 6' (1.829 m), weight 84.6 kg (186 lb 8.2 oz), SpO2 99 %.  Intake/Output Summary (Last 24 hours) at 12/03/15 1439 Last data filed at 12/03/15 0603  Gross per 24 hour  Intake              600 ml  Output              900 ml  Net             -300 ml   Filed Weights   11/30/15 2044 12/01/15 2043 12/02/15 2117  Weight: 84.3 kg  (185 lb 12.8 oz) 84.5 kg (186 lb 4.6 oz) 84.6 kg (186 lb 8.2 oz)    Exam: Awake Alert, Oriented *3, No new F.N deficits, Normal affect [place].AT,PERRAL Supple Neck,No JVD, No cervical lymphadenopathy appriciated.  Symmetrical Chest wall movement, Good air movement bilaterally, CTAB RRR,No Gallops,Rubs or new Murmurs, No Parasternal Heave +ve B.Sounds, Abd Soft, Non tender, No organomegaly appriciated, No rebound -guarding or rigidity. No Cyanosis, Clubbing or edema, No new Rash or bruise   PERTINENT RADIOLOGIC STUDIES: Ct Abdomen Pelvis Wo Contrast  Result Date: 12/01/2015 CLINICAL DATA:  Chronic nausea and vomiting. Prior CABG last month. Recurrent urinary tract infection. EXAM: CT ABDOMEN AND PELVIS WITHOUT CONTRAST TECHNIQUE: Multidetector CT imaging of the abdomen and pelvis was performed following the standard protocol without IV contrast. COMPARISON:  Abdominal radiograph of 11/14/2015 FINDINGS: Lower chest: Left lower lobe airspace opacity primarily from atelectasis but possibly with superimposed airspace filling process such as pneumonia. Small left pleural effusion, nonspecific for transudative or exudative etiology. Mild atelectasis in the right lower lobe. Small pericardial effusion. Aortic valve prosthesis. Coronary artery atherosclerotic calcification and prior median sternotomy. The sternotomy is still visible with about 3 mm of bony separation at the lower sternotomy site. Several of the lower sternotomy wires capture only the very margin of the left side of the sternotomy. Hepatobiliary: Dependent density in the gallbladder otherwise unremarkable. Pancreas: Unusual pancreatic contour which is smooth but thin, with little stippling of the pancreatic parenchyma ; appearance not changed from 08/27/2015. Spleen: Unremarkable Adrenals/Urinary Tract: 3 mm left kidney lower pole nonobstructive calculus. No hydronephrosis. Thick-walled urinary bladder diffusely. Stomach/Bowel: Mild prominence of  stool in the rectum. Orally administered contrast extends down through to the rectum. Appendix normal. Vascular/Lymphatic: Aortoiliac atherosclerotic vascular disease. No pathologic adenopathy identified. Reproductive: Unremarkable Other: Unremarkable Musculoskeletal: A transitional lumbosacral vertebra is assumed to represent the S1 level. Careful correlation with this numbering strategy prior to any procedural intervention would be recommended. Lumbar spondylosis and degenerative disc disease cause bilateral foraminal stenosis at L3-4, L4-5, and L5-S1. Direct left inguinal hernia contains adipose tissue. There is degenerative arthropathy of both hips. IMPRESSION: 1. Urinary bladder diffuse wall thickening may reflect cystitis. 2. Nonobstructive 3 mm left kidney lower pole nonobstructive calculus. 3. Left lower lobe airspace opacity partially from atelectasis but partially from airspace filling process, pneumonia not excluded. Small left pleural effusion. 4. Postoperative findings including recent median sternotomy and small pericardial effusion. Two of the 3 visualized lower sternotomy wires only capture the edge of the left side of the sternotomy ; this is probably incidental and dehiscence is considered unlikely, but correlate with any symptoms directly related to the sternum. 5. Dependent density in the gallbladder from sludge or gallstones. 6. Mild prominence of stool in the rectum, probably incidental. 7.  Aortoiliac atherosclerotic vascular  disease. 8. Lumbar spondylosis and degenerative disc disease causing foraminal impingement at L3-4, L4-5, and L5-S1 (assumes transitional S1 vertebra). 9. Direct left inguinal hernia contains adipose tissue. 10. Degenerative arthropathy of both hips. Electronically Signed   By: Van Clines M.D.   On: 12/01/2015 14:07   Dg Chest 1 View  Result Date: 11/05/2015 CLINICAL DATA:  Status post thoracentesis on the right EXAM: CHEST 1 VIEW COMPARISON:  November 04, 2015 FINDINGS: No pneumothorax evident. There has been interval resolution of pleural effusion on the right. A small left pleural effusion remains. There is bibasilar atelectatic change with patchy airspace consolidation in the left base. Heart is borderline enlarged with pulmonary vascularity within normal limits. Patient is status post coronary artery bypass grafting and aortic valve replacement. No bone lesions. No demonstrable adenopathy. There is an old healed fracture of the left clavicle. IMPRESSION: No pneumothorax following thoracentesis on the right. Interval resolution of right pleural effusion. Small persistent left pleural effusion. Persistent left lower lobe region consolidation. Stable cardiac silhouette. Electronically Signed   By: Lowella Grip III M.D.   On: 11/05/2015 15:23   Dg Chest 2 View  Result Date: 11/30/2015 CLINICAL DATA:  Left chest wall pain EXAM: CHEST  2 VIEW COMPARISON:  11/24/2015 FINDINGS: Aortic valve replacement and CABG again noted. Heart size mildly enlarged. Negative for heart failure Left lower lobe consolidation unchanged. Minimal right lower lobe airspace disease is improved. Probable small left effusion IMPRESSION: Left lower lobe consolidation unchanged. Mild right lower lobe airspace disease slightly improved. Electronically Signed   By: Franchot Gallo M.D.   On: 11/30/2015 12:33   Dg Chest 2 View  Result Date: 11/14/2015 CLINICAL DATA:  Medical hx: cancer, diabetes, hypertension. Patient is lightheaded with limited mobility. EXAM: CHEST  2 VIEW COMPARISON:  11/06/2015 FINDINGS: The cardiac silhouette is mildly enlarged. Changes from cardiac surgery and valve replacement are stable. No mediastinal or hilar masses.  No convincing adenopathy. There is left greater than right lung base opacity mildly improved from the prior study. This may reflect pneumonia, atelectasis or a combination. Minimal left pleural effusion. No pneumothorax. Remainder of the lungs is  clear. IMPRESSION: 1. Mild improvement in left greater than right lung base opacity. Decreased left pleural fluid. Residual lung base opacity is likely atelectasis. Pneumonia is possible. 2. No pulmonary edema.  No new abnormalities. Electronically Signed   By: Lajean Manes M.D.   On: 11/14/2015 09:27   Dg Chest 2 View  Result Date: 11/06/2015 CLINICAL DATA:  Pt states he had 2 valves replacements and an aortic aneurysm repair last week. Pt has no other symptoms or complaints and no other heart problems. Pt has diabetes. Pleural effusion, right EXAM: CHEST  2 VIEW COMPARISON:  11/05/2015 FINDINGS: Changes from cardiac surgery and valve replacement are stable. Cardiac silhouette is normal in size. No mediastinal or hilar masses. There is left greater than right lung base opacity. On the left, this is likely due to a combination of a small pleural effusion with atelectasis. The right lung base opacity is likely atelectasis. Is no overt pulmonary edema. No pneumothorax. IMPRESSION: 1. Bibasilar lung opacity is similar to the most recent prior exam. This is consistent with a combination a left pleural effusion and bilateral lung base atelectasis. 2. No overt pulmonary edema.  No pneumothorax. Electronically Signed   By: Lajean Manes M.D.   On: 11/06/2015 13:24   Dg Chest 2 View  Result Date: 11/04/2015 CLINICAL DATA:  Hypoxia, dizziness EXAM:  CHEST  2 VIEW COMPARISON:  11/02/2015 FINDINGS: Status post median sternotomy and cardiac valve replacement. Cardiomediastinal silhouette is stable. Stable title again noted some right perihilar scarring. Bilateral small pleural effusion with bilateral basilar atelectasis or infiltrate. No pulmonary edema. IMPRESSION: No pulmonary edema. Bilateral small pleural effusion with bilateral basilar atelectasis or infiltrate. Electronically Signed   By: Lahoma Crocker M.D.   On: 11/04/2015 12:51   Dg Abd 1 View  Result Date: 11/14/2015 CLINICAL DATA:  Medical hx: cancer,  diabetes, hypertension. Patient is lightheaded with limited mobility. EXAM: ABDOMEN - 1 VIEW COMPARISON:  None. FINDINGS: Normal bowel gas pattern. No evidence of renal or ureteral stones. Scattered aortic and iliac artery vascular calcifications. Soft tissues are otherwise unremarkable. No significant bony abnormality. IMPRESSION: No acute findings. Electronically Signed   By: Lajean Manes M.D.   On: 11/14/2015 09:28   Ct Head Wo Contrast  Result Date: 11/24/2015 CLINICAL DATA:  Headache and dizziness since bypass surgery on 10/29/2015. EXAM: CT HEAD WITHOUT CONTRAST TECHNIQUE: Contiguous axial images were obtained from the base of the skull through the vertex without intravenous contrast. COMPARISON:  11/13/2015 FINDINGS: Brain: The ventricles are normal in size and configuration. No extra-axial fluid collections are identified. The gray-white differentiation is normal. No CT findings for acute intracranial process such as hemorrhage or infarction. No mass lesions. The brainstem and cerebellum are grossly normal. Vascular: Minimal scattered vascular calcifications. No aneurysm or hyperdense vessels. Skull: No skull fracture or bone lesions. Sinuses/Orbits: The paranasal sinuses and mastoid air cells are clear. The globes are intact. Other: No scalp lesion or hematoma. IMPRESSION: Normal and stable head CT. Electronically Signed   By: Marijo Sanes M.D.   On: 11/24/2015 15:56   Ct Head Wo Contrast  Result Date: 11/13/2015 CLINICAL DATA:  Nausea, vomiting, and diarrhea for 2 weeks. Near syncope. EXAM: CT HEAD WITHOUT CONTRAST TECHNIQUE: Contiguous axial images were obtained from the base of the skull through the vertex without intravenous contrast. COMPARISON:  None. FINDINGS: Brain: No subdural, epidural, or subarachnoid hemorrhage. Ventricles and sulci are normal. Cerebellum, brainstem, and basal cisterns are normal. No acute cortical ischemia or infarct. Vascular: Calcified atherosclerosis is seen in  the intracranial portions of the carotid arteries. Skull: Normal. Negative for fracture or focal lesion. Sinuses/Orbits: No acute finding. Other: No other abnormalities. IMPRESSION: No acute intracranial abnormalities. Electronically Signed   By: Dorise Bullion III M.D   On: 11/13/2015 11:56   Nm Gastric Emptying  Result Date: 12/03/2015 CLINICAL DATA:  Diabetic with nausea and vomiting post CABG last month. EXAM: NUCLEAR MEDICINE GASTRIC EMPTYING SCAN TECHNIQUE: After oral ingestion of radiolabeled meal, sequential abdominal images were obtained for 4 hours. Percentage of activity emptying the stomach was calculated at 1 hour, 2 hour, 3 hour, and 4 hours. RADIOPHARMACEUTICALS:  2.0 mCi Tc-99m sulfur colloid in standardized meal COMPARISON:  Abdominal CT 12/01/2015. FINDINGS: Expected location of the stomach in the left upper quadrant. Ingested meal empties the stomach gradually over the course of the study. 20.4% emptied at 1 hr ( normal >= 10%) 40.4% emptied at 2 hr ( normal >= 40%) 92.2% emptied at 3 hr ( normal >= 70%) Study was terminated due to normal emptying at 3 hours. No 4 hour and teen calculated. IMPRESSION: Normal solid gastric emptying study. Electronically Signed   By: Richardean Sale M.D.   On: 12/03/2015 11:49   Dg Chest Port 1 View  Result Date: 11/24/2015 CLINICAL DATA:  Fever and atrial fibrillation EXAM: PORTABLE  CHEST 1 VIEW COMPARISON:  November 14, 2015 FINDINGS: There is airspace consolidation in the left lower lobe consistent with pneumonia. There is a small associated left pleural effusion. The lungs elsewhere are clear. Heart is upper normal in size with pulmonary vascularity within normal limits. The patient is status post aortic valve replacement. The patient is also status post coronary artery bypass grafting. No adenopathy. There is evidence of old healed fracture of the left clavicle. IMPRESSION: Lobe airspace consolidation consistent with pneumonia. Small left pleural  effusion. Lungs elsewhere clear. Stable cardiac silhouette. Followup PA and lateral chest radiographs recommended in 3-4 weeks following trial of antibiotic therapy to ensure resolution and exclude underlying malignancy. Electronically Signed   By: Lowella Grip III M.D.   On: 11/24/2015 14:00   US Thoracentesis Asp Pleural Space W/img Guide  Result Date: 11/05/2015 INDICATION: Reason CABG. Right-sided pleural effusion. Request therapeutic thoracentesis. EXAM: ULTRASOUND GUIDED RIGHT THORACENTESIS MEDICATIONS: None. COMPLICATIONS: None immediate. PROCEDURE: An ultrasound guided thoracentesis was thoroughly discussed with the patient and questions answered. The benefits, risks, alternatives and complications were also discussed. The patient understands and wishes to proceed with the procedure. Written consent was obtained. Ultrasound was performed to localize and mark an adequate pocket of fluid in the right chest. The area was then prepped and draped in the normal sterile fashion. 1% Lidocaine was used for local anesthesia. Under ultrasound guidance a Safe-T-Centesis catheter was introduced. Thoracentesis was performed. The catheter was removed and a dressing applied. FINDINGS: A total of approximately 700 mL of bloody pleural fluid was removed. IMPRESSION: Successful ultrasound guided right thoracentesis yielding 700 mL of pleural fluid. Read by: Ascencion Dike PA-C Electronically Signed   By: Corrie Mckusick D.O.   On: 11/05/2015 15:13     PERTINENT LAB RESULTS: CBC:  Recent Labs  12/01/15 0750  WBC 8.7  HGB 10.0*  HCT 30.5*  PLT 305   CMET CMP     Component Value Date/Time   NA 138 12/01/2015 0750   K 4.4 12/01/2015 0750   CL 104 12/01/2015 0750   CO2 26 12/01/2015 0750   GLUCOSE 124 (H) 12/01/2015 0750   BUN 5 (L) 12/01/2015 0750   CREATININE 0.96 12/01/2015 0750   CREATININE 1.06 10/25/2015 1319   CALCIUM 9.4 12/01/2015 0750   PROT 7.9 11/30/2015 1127   ALBUMIN 3.4 (L)  11/30/2015 1127   AST 21 11/30/2015 1127   ALT 18 11/30/2015 1127   ALKPHOS 82 11/30/2015 1127   BILITOT 0.5 11/30/2015 1127   GFRNONAA >60 12/01/2015 0750   GFRAA >60 12/01/2015 0750    GFR Estimated Creatinine Clearance: 80.8 mL/min (by C-G formula based on SCr of 0.96 mg/dL). No results for input(s): LIPASE, AMYLASE in the last 72 hours. No results for input(s): CKTOTAL, CKMB, CKMBINDEX, TROPONINI in the last 72 hours. Invalid input(s): POCBNP No results for input(s): DDIMER in the last 72 hours. No results for input(s): HGBA1C in the last 72 hours. No results for input(s): CHOL, HDL, LDLCALC, TRIG, CHOLHDL, LDLDIRECT in the last 72 hours. No results for input(s): TSH, T4TOTAL, T3FREE, THYROIDAB in the last 72 hours.  Invalid input(s): FREET3 No results for input(s): VITAMINB12, FOLATE, FERRITIN, TIBC, IRON, RETICCTPCT in the last 72 hours. Coags: No results for input(s): INR in the last 72 hours.  Invalid input(s): PT Microbiology: Recent Results (from the past 240 hour(s))  Urine culture     Status: Abnormal   Collection Time: 11/24/15  3:51 PM  Result Value Ref Range Status  Specimen Description URINE, RANDOM  Final   Special Requests NONE  Final   Culture >=100,000 COLONIES/mL KLEBSIELLA PNEUMONIAE (A)  Final   Report Status 11/27/2015 FINAL  Final   Organism ID, Bacteria KLEBSIELLA PNEUMONIAE (A)  Final      Susceptibility   Klebsiella pneumoniae - MIC*    AMPICILLIN >=32 RESISTANT Resistant     CEFAZOLIN <=4 SENSITIVE Sensitive     CEFTRIAXONE <=1 SENSITIVE Sensitive     CIPROFLOXACIN <=0.25 SENSITIVE Sensitive     GENTAMICIN <=1 SENSITIVE Sensitive     IMIPENEM <=0.25 SENSITIVE Sensitive     NITROFURANTOIN 32 SENSITIVE Sensitive     TRIMETH/SULFA <=20 SENSITIVE Sensitive     AMPICILLIN/SULBACTAM 4 SENSITIVE Sensitive     PIP/TAZO <=4 SENSITIVE Sensitive     Extended ESBL NEGATIVE Sensitive     * >=100,000 COLONIES/mL KLEBSIELLA PNEUMONIAE  Culture, blood  (Routine X 2) w Reflex to ID Panel     Status: None   Collection Time: 11/24/15  4:30 PM  Result Value Ref Range Status   Specimen Description BLOOD RIGHT ANTECUBITAL  Final   Special Requests BOTTLES DRAWN AEROBIC AND ANAEROBIC 5CC  Final   Culture NO GROWTH 5 DAYS  Final   Report Status 11/29/2015 FINAL  Final  Culture, blood (Routine X 2) w Reflex to ID Panel     Status: None   Collection Time: 11/24/15  4:58 PM  Result Value Ref Range Status   Specimen Description BLOOD LEFT ARM  Final   Special Requests BOTTLES DRAWN AEROBIC AND ANAEROBIC 5CC  Final   Culture NO GROWTH 5 DAYS  Final   Report Status 11/29/2015 FINAL  Final  C difficile quick scan w PCR reflex     Status: None   Collection Time: 11/25/15  6:18 PM  Result Value Ref Range Status   C Diff antigen NEGATIVE NEGATIVE Final   C Diff toxin NEGATIVE NEGATIVE Final   C Diff interpretation No C. difficile detected.  Final  Clostridium Difficile by PCR     Status: None   Collection Time: 11/25/15  7:41 PM  Result Value Ref Range Status   Toxigenic C Difficile by pcr NEGATIVE NEGATIVE Final    Comment: Patient is colonized with non toxigenic C. difficile. May not need treatment unless significant symptoms are present.  Urine culture     Status: None   Collection Time: 11/30/15 12:50 PM  Result Value Ref Range Status   Specimen Description URINE, RANDOM  Final   Special Requests NONE  Final   Culture NO GROWTH 1 DAY  Final   Report Status 12/02/2015 FINAL  Final    FURTHER DISCHARGE INSTRUCTIONS:  Get Medicines reviewed and adjusted: Please take all your medications with you for your next visit with your Primary MD  Laboratory/radiological data: Please request your Primary MD to go over all hospital tests and procedure/radiological results at the follow up, please ask your Primary MD to get all Hospital records sent to his/her office.  In some cases, they will be blood work, cultures and biopsy results pending at the  time of your discharge. Please request that your primary care M.D. goes through all the records of your hospital data and follows up on these results.  Also Note the following: If you experience worsening of your admission symptoms, develop shortness of breath, life threatening emergency, suicidal or homicidal thoughts you must seek medical attention immediately by calling 911 or calling your MD immediately  if symptoms less  severe.  You must read complete instructions/literature along with all the possible adverse reactions/side effects for all the Medicines you take and that have been prescribed to you. Take any new Medicines after you have completely understood and accpet all the possible adverse reactions/side effects.   Do not drive when taking Pain medications or sleeping medications (Benzodaizepines)  Do not take more than prescribed Pain, Sleep and Anxiety Medications. It is not advisable to combine anxiety,sleep and pain medications without talking with your primary care practitioner  Special Instructions: If you have smoked or chewed Tobacco  in the last 2 yrs please stop smoking, stop any regular Alcohol  and or any Recreational drug use.  Wear Seat belts while driving.  Please note: You were cared for by a hospitalist during your hospital stay. Once you are discharged, your primary care physician will handle any further medical issues. Please note that NO REFILLS for any discharge medications will be authorized once you are discharged, as it is imperative that you return to your primary care physician (or establish a relationship with a primary care physician if you do not have one) for your post hospital discharge needs so that they can reassess your need for medications and monitor your lab values.  Total Time spent coordinating discharge including counseling, education and face to face time equals  45 minutes.  SignedOren Binet 12/03/2015 2:39 PM

## 2015-12-03 NOTE — Final Progress Note (Signed)
Patient discharge teaching given, including activity, diet, follow-up appoints, and medications. Patient verbalized understanding of all discharge instructions. IV access was d/c'd. Vitals are stable. Skin is intact except as charted in most recent assessments. Pt to be escorted out by NT, to be driven home by family.  Chris-RN 

## 2015-12-03 NOTE — Care Management Important Message (Signed)
Important Message  Patient Details  Name: [name] MRN: [id number] Date of Birth: [birth year]   Medicare Important Message Given:  Yes    Mang Hazelrigg [address] 12/03/2015, 4:06 PM

## 2015-12-05 ENCOUNTER — Inpatient Hospital Stay (HOSPITAL_COMMUNITY)
Admission: EM | Admit: 2015-12-05 | Discharge: 2015-12-11 | DRG: 138 | Disposition: A | Discharge status: Home Health | Payer: Medicare Other | Attending: Internal Medicine | Admitting: Internal Medicine

## 2015-12-05 ENCOUNTER — Encounter (HOSPITAL_COMMUNITY): Payer: Self-pay

## 2015-12-05 DIAGNOSIS — E1165 Type 2 diabetes mellitus with hyperglycemia: Secondary | ICD-10-CM | POA: Diagnosis present

## 2015-12-05 DIAGNOSIS — K122 Cellulitis and abscess of mouth: Principal | ICD-10-CM | POA: Diagnosis present

## 2015-12-05 DIAGNOSIS — K1379 Other lesions of oral mucosa: Secondary | ICD-10-CM | POA: Diagnosis present

## 2015-12-05 DIAGNOSIS — Z794 Long term (current) use of insulin: Secondary | ICD-10-CM

## 2015-12-05 DIAGNOSIS — I480 Paroxysmal atrial fibrillation: Secondary | ICD-10-CM | POA: Diagnosis present

## 2015-12-05 DIAGNOSIS — I10 Essential (primary) hypertension: Secondary | ICD-10-CM | POA: Diagnosis present

## 2015-12-05 DIAGNOSIS — I951 Orthostatic hypotension: Secondary | ICD-10-CM | POA: Diagnosis present

## 2015-12-05 DIAGNOSIS — R1310 Dysphagia, unspecified: Secondary | ICD-10-CM | POA: Diagnosis present

## 2015-12-05 DIAGNOSIS — R112 Nausea with vomiting, unspecified: Secondary | ICD-10-CM | POA: Diagnosis present

## 2015-12-05 DIAGNOSIS — J029 Acute pharyngitis, unspecified: Secondary | ICD-10-CM | POA: Diagnosis present

## 2015-12-05 DIAGNOSIS — R1115 Cyclical vomiting syndrome unrelated to migraine: Secondary | ICD-10-CM

## 2015-12-05 DIAGNOSIS — Z953 Presence of xenogenic heart valve: Secondary | ICD-10-CM

## 2015-12-05 DIAGNOSIS — I2510 Atherosclerotic heart disease of native coronary artery without angina pectoris: Secondary | ICD-10-CM | POA: Diagnosis present

## 2015-12-05 DIAGNOSIS — E785 Hyperlipidemia, unspecified: Secondary | ICD-10-CM | POA: Diagnosis present

## 2015-12-05 DIAGNOSIS — I350 Nonrheumatic aortic (valve) stenosis: Secondary | ICD-10-CM

## 2015-12-05 DIAGNOSIS — J020 Streptococcal pharyngitis: Secondary | ICD-10-CM | POA: Diagnosis present

## 2015-12-05 DIAGNOSIS — E1149 Type 2 diabetes mellitus with other diabetic neurological complication: Secondary | ICD-10-CM | POA: Diagnosis present

## 2015-12-05 DIAGNOSIS — Z951 Presence of aortocoronary bypass graft: Secondary | ICD-10-CM

## 2015-12-05 DIAGNOSIS — IMO0002 Reserved for concepts with insufficient information to code with codable children: Secondary | ICD-10-CM | POA: Diagnosis present

## 2015-12-05 DIAGNOSIS — Z95828 Presence of other vascular implants and grafts: Secondary | ICD-10-CM

## 2015-12-05 DIAGNOSIS — Z79899 Other long term (current) drug therapy: Secondary | ICD-10-CM

## 2015-12-05 DIAGNOSIS — Z7901 Long term (current) use of anticoagulants: Secondary | ICD-10-CM

## 2015-12-05 DIAGNOSIS — Z9889 Other specified postprocedural states: Secondary | ICD-10-CM

## 2015-12-05 DIAGNOSIS — K219 Gastro-esophageal reflux disease without esophagitis: Secondary | ICD-10-CM | POA: Diagnosis present

## 2015-12-05 DIAGNOSIS — I25810 Atherosclerosis of coronary artery bypass graft(s) without angina pectoris: Secondary | ICD-10-CM | POA: Diagnosis present

## 2015-12-05 DIAGNOSIS — K115 Sialolithiasis: Secondary | ICD-10-CM | POA: Diagnosis present

## 2015-12-05 DIAGNOSIS — L980 Pyogenic granuloma: Secondary | ICD-10-CM | POA: Diagnosis present

## 2015-12-05 MED ORDER — SODIUM CHLORIDE 0.9 % IV BOLUS (SEPSIS)
500.0000 mL | Freq: Once | INTRAVENOUS | Status: AC
  Administered 2015-12-06: 500 mL via INTRAVENOUS

## 2015-12-05 MED ORDER — ONDANSETRON HCL 4 MG/2ML IJ SOLN
4.0000 mg | Freq: Once | INTRAMUSCULAR | Status: AC
  Administered 2015-12-06: 4 mg via INTRAVENOUS
  Filled 2015-12-05: qty 2

## 2015-12-05 MED ORDER — FENTANYL CITRATE (PF) 100 MCG/2ML IJ SOLN
100.0000 ug | INTRAMUSCULAR | Status: AC | PRN
  Administered 2015-12-06 (×2): 100 ug via INTRAVENOUS
  Administered 2015-12-10: 75 ug via INTRAVENOUS
  Administered 2015-12-10: 25 ug via INTRAVENOUS
  Filled 2015-12-05 (×2): qty 2

## 2015-12-05 NOTE — ED Provider Notes (Signed)
[facility] DEPT Provider Note   CSN: [id number] Arrival date & time: 12/05/15  2111     History   Chief Complaint Chief Complaint  Patient presents with  . Post-op Problem    HPI CALVEN SANKER is a 70 y.o. male.  He presents for evaluation of sore throat, difficulty swallowing, gradually worse for 3 days following endoscopy 3 days ago. His wife noticed that his uvula was swollen, following the endoscopy, and it has gotten worse. He is unable to eat anything or take medications today. There's been no fever, cough, shortness of breath, chest pain, or dizziness. The patient has such severe pain, with talking that he can only whisper a little bit. He states that after the endoscopy, he noticed that his uvula, "looked flat". Today he was able to take his usual morning medications, and drink some fluid and eats some broth. He has not been able swallow much since about 6 PM, and is continually spitting. There are no other known modifying factors.   HPI  Past Medical History:  Diagnosis Date  . 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 Carpal tunnel syndrome 09/24/2014   Bilateral  . 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 Diabetes mellitus, type 2 ([place])   . Erectile dysfunction   . Essential hypertension   . Hyperlipidemia     Patient Active Problem List   Diagnosis Date Noted  . Tibial plateau fracture, left, closed, initial encounter 12/02/2015  . Coronary artery disease involving coronary bypass graft of native heart without angina pectoris   . Recurrent UTI   . Nausea and vomiting 11/30/2015  . Pyelonephritis 11/30/2015  . HCAP (healthcare-associated pneumonia) 11/24/2015  . AKI (acute kidney injury) ([place]) 11/24/2015  . PAF (paroxysmal atrial fibrillation) ([place]) 11/24/2015  . Orthostatic hypotension   . Difficulty in walking, not elsewhere classified   . Fungus present in urine   . Acute cystitis without  hematuria   . Near syncope 11/14/2015  . Hx of CABG (Sept 2017) 11/13/2015  . Atrial fibrillation (post op after CABG Sept 2017) 11/13/2015  . Nausea vomiting and diarrhea 11/13/2015  . Orthostatic syncope 11/13/2015  . Hematuria, gross 11/13/2015  . Dehydration with hyponatremia 11/13/2015  . Elevated troponin 11/13/2015  . Coronary artery disease involving native coronary artery of native heart without angina pectoris   . Aortic stenosis s/p tissue valve (Sept 2017) 10/28/2015  . Atypical angina ([place]) 10/27/2015  . Abnormal nuclear stress test 10/27/2015  . Coronary artery disease involving native heart with angina pectoris ([place]) 10/27/2015  . Aortic aneurysm ([place]) 07/23/2015  . Carpal tunnel syndrome 09/24/2014  . Right hand pain 12/29/2013  . Left carotid bruit 05/07/2013  . Ganglion cyst 12/30/2012  . Diabetic retinopathy ([place]) 09/23/2012  . Diminished pulses in lower extremity 06/17/2012  . Chest pain 03/22/2011  . Paresthesia of foot 11/04/2010  . Aortic valve disease 04/06/2010  . HYPERTROPHY PROSTATE W/UR OBST & OTH LUTS 03/15/2009  . Hyperlipidemia 12/13/2007  . Type II diabetes mellitus with neurological manifestations, uncontrolled ([place]) 11/29/2007  . ERECTILE DYSFUNCTION 11/29/2007  . Essential hypertension 11/29/2007    Past Surgical History:  Procedure Laterality Date  . AORTIC VALVE REPLACEMENT N/A 10/29/2015   Procedure: AORTIC VALVE REPLACEMENT (AVR) WITH 23MM MAGNA EASE TISSUE VALVE.;  Surgeon: [name], MD;  Location: [facility];  Service: Open Heart Surgery;  Laterality: N/A;  . CARDIAC CATHETERIZATION N/A 10/27/2015  Procedure: Left Heart Cath and Coronary Angiography;  Surgeon: [name], MD;  Location: [facility];  Service: Cardiovascular;  Laterality: N/A;  . CORONARY ARTERY BYPASS GRAFT N/A 10/29/2015   Procedure: CORONARY ARTERY BYPASS GRAFTING (CABG) x Four UTILIZING THE LEFT INTERNAL MAMMARY ARTERY AND ENDOSCOPICALLY HARVESTED RIGHT  SAPEHENEOUS VEINS.;  Surgeon: [name], MD;  Location: [facility];  Service: Open Heart Surgery;  Laterality: N/A;  . CYST REMOVAL HAND Right   . ESOPHAGOGASTRODUODENOSCOPY N/A 12/02/2015   Procedure: ESOPHAGOGASTRODUODENOSCOPY (EGD);  Surgeon: [name], MD;  Location: [facility];  Service: Gastroenterology;  Laterality: N/A;  . REPLACEMENT ASCENDING AORTA N/A 10/29/2015   Procedure: REPLACEMENT OF ASCENDING AORTA USING 34MM X 30CM WOVEN DOUBLE VELOUR VASCULAR GRAFT.;  Surgeon: [name], MD;  Location: [facility];  Service: Open Heart Surgery;  Laterality: N/A;  . TEE WITHOUT CARDIOVERSION N/A 10/29/2015   Procedure: TRANSESOPHAGEAL ECHOCARDIOGRAM (TEE);  Surgeon: [name], MD;  Location: [facility];  Service: Open Heart Surgery;  Laterality: N/A;       Home Medications    Prior to Admission medications   Medication Sig Start Date End Date Taking? Authorizing Provider  amiodarone (PACERONE) 200 MG tablet Take 1 tablet (200 mg total) by mouth daily. 11/23/15  Yes Thayer Headings, MD  atorvastatin (LIPITOR) 40 MG tablet Take 1 tablet (40 mg total) by mouth daily. 07/29/15  Yes Thayer Headings, MD  B-D ULTRAFINE III SHORT PEN 31G X 8 MM MISC USE 4 TIMES DAILY AS DIRECTED 11/30/15  Yes [name], NP  ferrous gluconate (FERGON) 324 MG tablet Take 1 tablet (324 mg total) by mouth 2 (two) times daily with a meal. 11/08/15  Yes Wayne E Gold, PA-C  folic acid (FOLVITE) 1 MG tablet Take 1 tablet (1 mg total) by mouth daily. 11/08/15  Yes Wayne E Gold, PA-C  glucose blood test strip Use as instructed 12/25/14  Yes [name], NP  Insulin Glargine (LANTUS SOLOSTAR) 100 UNIT/ML Solostar Pen Inject 20 Units into the skin daily at 10 pm. Patient taking differently: Inject 20 Units into the skin at bedtime.  11/27/15  Yes Reyne Dumas, MD  insulin lispro (HUMALOG KWIKPEN) 100 UNIT/ML KiwkPen Use 5 to 10 units three times daily 10-15 minutes before meals Patient taking  differently: Inject 5-10 Units into the skin 3 (three) times daily before meals.  09/09/14  Yes Doe-Hyun R Shawna Orleans, DO  metFORMIN (GLUCOPHAGE) 1000 MG tablet Take 1 tablet (1,000 mg total) by mouth 2 (two) times daily with a meal. 09/07/15  Yes [name], NP  midodrine (PROAMATINE) 10 MG tablet Take 1 tablet (10 mg total) by mouth 3 (three) times daily with meals. 11/20/15  Yes Modena Jansky, MD  ondansetron (ZOFRAN ODT) 4 MG disintegrating tablet Take 1 tablet (4 mg total) by mouth every 8 (eight) hours as needed for nausea or vomiting. 12/03/15  Yes Shanker Kristeen Mans, MD  pantoprazole (PROTONIX) 40 MG tablet Take 1 tablet (40 mg total) by mouth daily at 12 noon. 12/03/15  Yes Shanker Kristeen Mans, MD  rivaroxaban (XARELTO) 20 MG TABS tablet Take 1 tablet (20 mg total) by mouth daily with supper. 11/08/15  Yes Erin R [name], PA-C  saccharomyces boulardii (FLORASTOR) 250 MG capsule Take 1 capsule (250 mg total) by mouth 2 (two) times daily. 11/27/15 12/07/15 Yes Reyne Dumas, MD  famotidine (PEPCID AC) 10 MG chewable tablet Chew 1 tablet (10 mg total) by mouth 2 (two) times  daily as needed for heartburn. Patient not taking: Reported on 12/05/2015 10/11/15   Thayer Headings, MD    Family History Family History  Problem Relation Age of Onset  . Cancer Father     Throat cancer  . Diabetes Sister     Social History Social History  Substance Use Topics  . Smoking status: Never Smoker  . Smokeless tobacco: Never Used  . Alcohol use No     Allergies   No known allergies   Review of Systems Review of Systems  All other systems reviewed and are negative.    Physical Exam Updated Vital Signs BP 143/73   Pulse 92   Temp 99 F (37.2 C) (Oral)   Resp 20   Ht 6' (1.829 m)   Wt 185 lb (83.9 kg)   SpO2 98%   BMI 25.09 kg/m   Physical Exam  Constitutional: He is oriented to person, place, and time. He appears well-developed and well-nourished. No distress (He is uncomfortable).  HENT:   Head: Normocephalic and atraumatic.  Right Ear: External ear normal.  Left Ear: External ear normal.  Uvula, is elongated, about 3 cm, and inflamed with purple reddish discoloration, it is bleeding slightly. With swallowing, the patient has pain and prefers to expectorate. There is no associated sublingual or upper cervical swelling. There is no trismus.  Eyes: Conjunctivae and EOM are normal. Pupils are equal, round, and reactive to light.  Neck: Normal range of motion and phonation normal. Neck supple. No tracheal deviation present.  Cardiovascular: Normal rate, regular rhythm and normal heart sounds.   Pulmonary/Chest: Effort normal and breath sounds normal. No stridor. He exhibits no bony tenderness.  Abdominal: Soft. There is no tenderness.  Musculoskeletal: Normal range of motion.  Neurological: He is alert and oriented to person, place, and time. No cranial nerve deficit or sensory deficit. He exhibits normal muscle tone. Coordination normal.  Skin: Skin is warm, dry and intact.  Psychiatric: He has a normal mood and affect. His behavior is normal. Judgment and thought content normal.  Nursing note and vitals reviewed.    ED Treatments / Results  Labs (all labs ordered are listed, but only abnormal results are displayed) Labs Reviewed  CBC WITH DIFFERENTIAL/PLATELET  I-STAT CHEM 8, ED  I-STAT CG4 LACTIC ACID, ED    EKG  EKG Interpretation None       Radiology No results found.  Procedures Procedures (including critical care time)  Medications Ordered in ED Medications  sodium chloride 0.9 % bolus 500 mL (not administered)  fentaNYL (SUBLIMAZE) injection 100 mcg (not administered)  ondansetron (ZOFRAN) injection 4 mg (not administered)     Initial Impression / Assessment and Plan / ED Course  I have reviewed the triage vital signs and the nursing notes.  Pertinent labs & imaging results that were available during my care of the patient were reviewed by me and  considered in my medical decision making (see chart for details).  Clinical Course  Comment By Time  Plan: labs pending.  Pt given pain meds. Will need CT scan neck soft tissue Abigail Butts, PA-C 10/23 0017    Medications  sodium chloride 0.9 % bolus 500 mL (not administered)  fentaNYL (SUBLIMAZE) injection 100 mcg (not administered)  ondansetron (ZOFRAN) injection 4 mg (not administered)    Patient Vitals for the past 24 hrs:  BP Temp Temp src Pulse Resp SpO2 Height Weight  12/05/15 2300 143/73 - - 92 20 98 % - -  12/05/15 2151 182/96 - - (!) 126 18 97 % - -  12/05/15 2129 - - - - - - 6' (1.829 m) 185 lb (83.9 kg)  12/05/15 2128 123/68 99 F (37.2 C) Oral 110 18 97 % - -       23:55- case discussed with gastroenterology, Dr. Edison Nasuti is on-call for Dr. Havery Moros. He states that this could possibly be an endoscopy complication. However, he has not seen it happen. He suggested asking ENT for input.     Final Clinical Impressions(s) / ED Diagnoses   Final diagnoses:  Uvulitis    Nonspecific uvula swelling, pain and bleeding, post endoscopy. Possible localized trauma. Doubt bacterial infection. CT imaging ordered to evaluate for occult injury pattern, and exploration for swallowing difficulty.  Nursing Notes Reviewed/ Care Coordinated Applicable Imaging Reviewed Interpretation of Laboratory Data incorporated into ED treatment  Plan: as per oncoming provider team  New Prescriptions New Prescriptions   No medications on file     Daleen Bo, MD 12/06/15 0018

## 2015-12-05 NOTE — ED Notes (Addendum)
Family concerned over pt being brought to waiting room.  C/o "spitting up blood."  Vitals updated.  Pt to go to next available room.  Pt and family updated.

## 2015-12-05 NOTE — ED Notes (Signed)
Pt nonverbal d/t pain. Pt's family reports emesis and severe sore throat since endo 10/20.  On assessment pt's uvula extremely engorged with blood, purple in color. Pt unable to eat d/t pain Family reports seeing blood in emesis

## 2015-12-05 NOTE — ED Triage Notes (Signed)
Per EMS: Pt complaining of sore throat. Pt states had endoscopy on Thursday. Pt complaining of increased pain over last few days. Increased pain with swallowing. Pt with some redness and swelling to throat.

## 2015-12-06 ENCOUNTER — Encounter (HOSPITAL_COMMUNITY): Payer: Self-pay | Admitting: Radiology

## 2015-12-06 ENCOUNTER — Encounter: Payer: Self-pay | Admitting: Physician Assistant

## 2015-12-06 ENCOUNTER — Other Ambulatory Visit: Payer: Self-pay

## 2015-12-06 ENCOUNTER — Inpatient Hospital Stay (HOSPITAL_COMMUNITY): Payer: Medicare Other

## 2015-12-06 ENCOUNTER — Encounter: Payer: Self-pay | Admitting: Adult Health

## 2015-12-06 ENCOUNTER — Emergency Department (HOSPITAL_COMMUNITY): Payer: Medicare Other

## 2015-12-06 DIAGNOSIS — K122 Cellulitis and abscess of mouth: Principal | ICD-10-CM

## 2015-12-06 DIAGNOSIS — I480 Paroxysmal atrial fibrillation: Secondary | ICD-10-CM

## 2015-12-06 DIAGNOSIS — R1310 Dysphagia, unspecified: Secondary | ICD-10-CM

## 2015-12-06 DIAGNOSIS — J029 Acute pharyngitis, unspecified: Secondary | ICD-10-CM | POA: Diagnosis present

## 2015-12-06 DIAGNOSIS — I25810 Atherosclerosis of coronary artery bypass graft(s) without angina pectoris: Secondary | ICD-10-CM | POA: Diagnosis not present

## 2015-12-06 DIAGNOSIS — K1379 Other lesions of oral mucosa: Secondary | ICD-10-CM | POA: Diagnosis present

## 2015-12-06 DIAGNOSIS — Z951 Presence of aortocoronary bypass graft: Secondary | ICD-10-CM

## 2015-12-06 DIAGNOSIS — I10 Essential (primary) hypertension: Secondary | ICD-10-CM | POA: Diagnosis not present

## 2015-12-06 DIAGNOSIS — E785 Hyperlipidemia, unspecified: Secondary | ICD-10-CM

## 2015-12-06 DIAGNOSIS — R112 Nausea with vomiting, unspecified: Secondary | ICD-10-CM | POA: Diagnosis present

## 2015-12-06 LAB — CBC WITH DIFFERENTIAL/PLATELET
Basophils Absolute: 0 10*3/uL (ref 0.0–0.1)
Basophils Relative: 0 %
EOS PCT: 1 %
Eosinophils Absolute: 0.1 10*3/uL (ref 0.0–0.7)
HCT: 32.8 % — ABNORMAL LOW (ref 39.0–52.0)
HEMOGLOBIN: 10.9 g/dL — AB (ref 13.0–17.0)
LYMPHS ABS: 1.8 10*3/uL (ref 0.7–4.0)
LYMPHS PCT: 17 %
MCH: 28.6 pg (ref 26.0–34.0)
MCHC: 33.2 g/dL (ref 30.0–36.0)
MCV: 86.1 fL (ref 78.0–100.0)
Monocytes Absolute: 1.1 10*3/uL — ABNORMAL HIGH (ref 0.1–1.0)
Monocytes Relative: 10 %
NEUTROS PCT: 72 %
Neutro Abs: 7.9 10*3/uL — ABNORMAL HIGH (ref 1.7–7.7)
Platelets: 276 10*3/uL (ref 150–400)
RBC: 3.81 MIL/uL — AB (ref 4.22–5.81)
RDW: 14.9 % (ref 11.5–15.5)
WBC: 10.9 10*3/uL — AB (ref 4.0–10.5)

## 2015-12-06 LAB — CBC
HEMATOCRIT: 32.3 % — AB (ref 39.0–52.0)
HEMOGLOBIN: 10.4 g/dL — AB (ref 13.0–17.0)
MCH: 28 pg (ref 26.0–34.0)
MCHC: 32.2 g/dL (ref 30.0–36.0)
MCV: 86.8 fL (ref 78.0–100.0)
Platelets: 287 10*3/uL (ref 150–400)
RBC: 3.72 MIL/uL — AB (ref 4.22–5.81)
RDW: 15.1 % (ref 11.5–15.5)
WBC: 9.4 10*3/uL (ref 4.0–10.5)

## 2015-12-06 LAB — GLUCOSE, CAPILLARY
GLUCOSE-CAPILLARY: 158 mg/dL — AB (ref 65–99)
GLUCOSE-CAPILLARY: 152 mg/dL — AB (ref 65–99)
Glucose-Capillary: 142 mg/dL — ABNORMAL HIGH (ref 65–99)

## 2015-12-06 LAB — BASIC METABOLIC PANEL
ANION GAP: 7 (ref 5–15)
BUN: 7 mg/dL (ref 6–20)
CALCIUM: 9.3 mg/dL (ref 8.9–10.3)
CO2: 29 mmol/L (ref 22–32)
Chloride: 99 mmol/L — ABNORMAL LOW (ref 101–111)
Creatinine, Ser: 1.16 mg/dL (ref 0.61–1.24)
Glucose, Bld: 180 mg/dL — ABNORMAL HIGH (ref 65–99)
POTASSIUM: 4.4 mmol/L (ref 3.5–5.1)
Sodium: 135 mmol/L (ref 135–145)

## 2015-12-06 LAB — I-STAT CG4 LACTIC ACID, ED: Lactic Acid, Venous: 1.47 mmol/L (ref 0.5–1.9)

## 2015-12-06 LAB — I-STAT CHEM 8, ED
BUN: 9 mg/dL (ref 6–20)
CALCIUM ION: 1.06 mmol/L — AB (ref 1.15–1.40)
CHLORIDE: 99 mmol/L — AB (ref 101–111)
Creatinine, Ser: 1.1 mg/dL (ref 0.61–1.24)
GLUCOSE: 174 mg/dL — AB (ref 65–99)
HCT: 35 % — ABNORMAL LOW (ref 39.0–52.0)
Hemoglobin: 11.9 g/dL — ABNORMAL LOW (ref 13.0–17.0)
Potassium: 4.1 mmol/L (ref 3.5–5.1)
Sodium: 136 mmol/L (ref 135–145)
TCO2: 28 mmol/L (ref 0–100)

## 2015-12-06 LAB — RAPID STREP SCREEN (MED CTR MEBANE ONLY): Streptococcus, Group A Screen (Direct): POSITIVE — AB

## 2015-12-06 LAB — CBG MONITORING, ED: GLUCOSE-CAPILLARY: 166 mg/dL — AB (ref 65–99)

## 2015-12-06 LAB — MRSA PCR SCREENING: MRSA BY PCR: NEGATIVE

## 2015-12-06 MED ORDER — SODIUM CHLORIDE 0.9% FLUSH
3.0000 mL | Freq: Two times a day (BID) | INTRAVENOUS | Status: DC
  Administered 2015-12-06 – 2015-12-11 (×9): 3 mL via INTRAVENOUS

## 2015-12-06 MED ORDER — ONDANSETRON HCL 4 MG/2ML IJ SOLN
4.0000 mg | Freq: Three times a day (TID) | INTRAMUSCULAR | Status: DC | PRN
  Administered 2015-12-06 – 2015-12-09 (×4): 4 mg via INTRAVENOUS
  Filled 2015-12-06 (×5): qty 2

## 2015-12-06 MED ORDER — MORPHINE SULFATE (PF) 4 MG/ML IV SOLN
2.0000 mg | INTRAVENOUS | Status: DC | PRN
  Administered 2015-12-06 – 2015-12-08 (×3): 2 mg via INTRAVENOUS
  Filled 2015-12-06 (×3): qty 1

## 2015-12-06 MED ORDER — METOPROLOL TARTRATE 5 MG/5ML IV SOLN: 5.0000 mg | INTRAVENOUS | Status: DC | PRN

## 2015-12-06 MED ORDER — INSULIN ASPART 100 UNIT/ML ~~LOC~~ SOLN
0.0000 [IU] | Freq: Three times a day (TID) | SUBCUTANEOUS | Status: DC
  Administered 2015-12-06: 1 [IU] via SUBCUTANEOUS
  Administered 2015-12-06: 2 [IU] via SUBCUTANEOUS
  Administered 2015-12-07: 1 [IU] via SUBCUTANEOUS
  Administered 2015-12-07: 2 [IU] via SUBCUTANEOUS
  Administered 2015-12-08: 3 [IU] via SUBCUTANEOUS
  Administered 2015-12-09: 1 [IU] via SUBCUTANEOUS
  Administered 2015-12-10: 3 [IU] via SUBCUTANEOUS
  Administered 2015-12-10 – 2015-12-11 (×2): 1 [IU] via SUBCUTANEOUS
  Administered 2015-12-11: 2 [IU] via SUBCUTANEOUS
  Filled 2015-12-06: qty 1

## 2015-12-06 MED ORDER — ACETAMINOPHEN 650 MG RE SUPP: 650.0000 mg | Freq: Four times a day (QID) | RECTAL | Status: DC | PRN

## 2015-12-06 MED ORDER — SODIUM CHLORIDE 0.9 % IV SOLN
INTRAVENOUS | Status: AC
  Administered 2015-12-06: 08:00:00 via INTRAVENOUS

## 2015-12-06 MED ORDER — ACETAMINOPHEN 325 MG PO TABS: 650.0000 mg | ORAL_TABLET | Freq: Four times a day (QID) | ORAL | Status: DC | PRN

## 2015-12-06 MED ORDER — GADOBENATE DIMEGLUMINE 529 MG/ML IV SOLN
15.0000 mL | Freq: Once | INTRAVENOUS | Status: AC | PRN
  Administered 2015-12-06: 15 mL via INTRAVENOUS

## 2015-12-06 MED ORDER — INSULIN GLARGINE 100 UNIT/ML ~~LOC~~ SOLN
15.0000 [IU] | Freq: Every day | SUBCUTANEOUS | Status: DC
  Administered 2015-12-06 – 2015-12-10 (×5): 15 [IU] via SUBCUTANEOUS
  Filled 2015-12-06 (×6): qty 0.15

## 2015-12-06 MED ORDER — FAMOTIDINE IN NACL 20-0.9 MG/50ML-% IV SOLN
20.0000 mg | Freq: Two times a day (BID) | INTRAVENOUS | Status: DC
  Administered 2015-12-06 – 2015-12-07 (×3): 20 mg via INTRAVENOUS
  Filled 2015-12-06 (×4): qty 50

## 2015-12-06 MED ORDER — IOPAMIDOL (ISOVUE-300) INJECTION 61%
INTRAVENOUS | Status: AC
  Administered 2015-12-06: 75 mL
  Filled 2015-12-06: qty 75

## 2015-12-06 MED ORDER — PENICILLIN G BENZATHINE 1200000 UNIT/2ML IM SUSP
1.2000 10*6.[IU] | Freq: Once | INTRAMUSCULAR | Status: AC
  Administered 2015-12-06: 1.2 10*6.[IU] via INTRAMUSCULAR
  Filled 2015-12-06: qty 2

## 2015-12-06 NOTE — ED Notes (Signed)
Patient resting on stretcher talking to visitors at bedside; no needs at this time 

## 2015-12-06 NOTE — ED Provider Notes (Signed)
Care transferred from Dr. Eulis Foster.  [name] is a 70 y.o. male presents with sore throat, difficulty swallowing and bleeding from the uvula after endoscopy 3 days ago.  Pt reports he was able to take medications and swallow broth this morning, but is having difficulty handling secretions here in the ED.  Pt is anticoagulated on Xarelto after CABG in Aug.  Pt was discussed with GI who report that trauma to the uvula during the procedure was possible but less likely.    Physical Exam  BP 143/73   Pulse 92   Temp 99 F (37.2 C) (Oral)   Resp 20   Ht 6' (1.829 m)   Wt 83.9 kg   SpO2 98%   BMI 25.09 kg/m   Physical Exam  Constitutional: He appears well-developed and well-nourished. No distress.  HENT:  Head: Normocephalic.  Oozing blood from uvula Unable to swallow, using suction   Eyes: Conjunctivae are normal. No scleral icterus.  Neck: Normal range of motion.  Cardiovascular: Normal rate and intact distal pulses.   Pulmonary/Chest: Effort normal.  Musculoskeletal: Normal range of motion.  Neurological: He is alert.  Skin: Skin is warm and dry.     ED Course  Procedures  Results for orders placed or performed during the hospital encounter of 12/05/15  CBC with Differential  Result Value Ref Range   WBC 10.9 (H) 4.0 - 10.5 K/uL   RBC 3.81 (L) 4.22 - 5.81 MIL/uL   Hemoglobin 10.9 (L) 13.0 - 17.0 g/dL   HCT 32.8 (L) 39.0 - 52.0 %   MCV 86.1 78.0 - 100.0 fL   MCH 28.6 26.0 - 34.0 pg   MCHC 33.2 30.0 - 36.0 g/dL   RDW 14.9 11.5 - 15.5 %   Platelets 276 150 - 400 K/uL   Neutrophils Relative % 72 %   Neutro Abs 7.9 (H) 1.7 - 7.7 K/uL   Lymphocytes Relative 17 %   Lymphs Abs 1.8 0.7 - 4.0 K/uL   Monocytes Relative 10 %   Monocytes Absolute 1.1 (H) 0.1 - 1.0 K/uL   Eosinophils Relative 1 %   Eosinophils Absolute 0.1 0.0 - 0.7 K/uL   Basophils Relative 0 %   Basophils Absolute 0.0 0.0 - 0.1 K/uL  I-stat Chem 8, ED  Result Value Ref Range   Sodium 136 135 - 145 mmol/L    Potassium 4.1 3.5 - 5.1 mmol/L   Chloride 99 (L) 101 - 111 mmol/L   BUN 9 6 - 20 mg/dL   Creatinine, Ser 1.10 0.61 - 1.24 mg/dL   Glucose, Bld 174 (H) 65 - 99 mg/dL   Calcium, Ion 1.06 (L) 1.15 - 1.40 mmol/L   TCO2 28 0 - 100 mmol/L   Hemoglobin 11.9 (L) 13.0 - 17.0 g/dL   HCT 35.0 (L) 39.0 - 52.0 %  I-Stat CG4 Lactic Acid, ED  Result Value Ref Range   Lactic Acid, Venous 1.47 0.5 - 1.9 mmol/L    Ct Soft Tissue Neck W Contrast  Result Date: 12/06/2015 CLINICAL DATA:  Sore throat, difficulty swallowing for 3 days after endoscopy. EXAM: CT NECK WITH CONTRAST TECHNIQUE: Multidetector CT imaging of the neck was performed using the standard protocol following the bolus administration of intravenous contrast. CONTRAST:  36mL ISOVUE-300 IOPAMIDOL (ISOVUE-300) INJECTION 61% COMPARISON:  Chest radiograph November 30, 2015 and CT HEAD November 24, 2015 and CT chest August 27, 2015 FINDINGS: Pharynx and larynx: Secretions/debris in LEFT piriform sinus. Sub cm RIGHT laryngocele apposition of the  true vocal cords most compatible with phonation, however true vocal cords appeared mildly thickened. Airway is patent. Elongated RIGHT superior cornu thyroid cartilage. Salivary glands: 6 x 12 mm RIGHT sublingual gland sialolith without sialoadenitis. Major salivary glands are otherwise unremarkable. Thyroid: Normal. Lymph nodes: 14 mm LEFT supraclavicular lymph node versus venous structure. Vascular: Mild calcific atherosclerosis of the carotid bifurcations. Limited intracranial: Normal. Visualized orbits: Normal. Mastoids and visualized paranasal sinuses: Well aerated. Skeleton: Patient is edentulous. Status post median sternotomy. No destructive bony lesions. Cold LEFT clavicle fracture. Upper chest: LEFT lung volume loss with pleural effusion and prominent pulmonary vessels. LEFT calcified pleural plaques. Re- demonstration of ascending aortic aneurysm, mild cardiomegaly and coronary artery calcifications. Other:  None. IMPRESSION: Apposed vocal cords, most compatible with phonation though, there could be a component of laryngitis. Secretions in LEFT piriform sinus. 6 x 12 mm RIGHT sublingual gland sialolith without sialoadenitis. Persistent LEFT lung atelectasis, with pleural effusion. 14 mm LEFT supraclavicular lymph node versus venous structures. Recommend follow-up CT of the chest with contrast on a nonemergent basis for further characterization. Electronically Signed   By: Elon Alas M.D.   On: 12/06/2015 02:24     Clinical Course  Value Comment By Time   Plan: labs pending.  Pt given pain meds. Will need CT scan neck soft tissue Abigail Butts, PA-C 10/23 0017   Pt continues to vomit and gag.  Every time he coughs he vomits.  He remains unable to swallow.   Jarrett Soho Torence Palmeri, PA-C 10/23 0234  WBC: (!) 10.9 Leukocytosis Abigail Butts, PA-C 10/23 [id number]   Discussed with Dr. Blaine Hamper who will admit to stepdown.  Pt will need ENT consult in the AM [facility], PA-C 10/23 0258     MDM  Patient with uvulitis after EGD. CT soft neck does not show signs of abscess. Patient continues to have increased gag reflex, emesis and oozing of blood. He is unable to swallow due to pain. Patient will need observation overnight for fluids and nausea medication.    1. Uvulitis      Abigail Butts, PA-C 12/06/15 [id number]    Daleen Bo, MD 12/08/15 [phone]

## 2015-12-06 NOTE — Plan of Care (Signed)
Problem: Pain Managment: Goal: General experience of comfort will improve Outcome: Progressing Patient complaining of pain in throat and roof of mouth. Tested positive for Strep Throat. Pain relieved slightly with fentanyl.   Problem: Skin Integrity: Goal: Risk for impaired skin integrity will decrease Outcome: Progressing Patient able to turn self and encouraged to reposition.  Problem: Tissue Perfusion: Goal: Risk factors for ineffective tissue perfusion will decrease Outcome: Progressing Patient with SCD's to bilateral lower extremities.  Problem: Nutrition: Goal: Adequate nutrition will be maintained Outcome: Progressing Patient diet advanced to full liquids, however, he was not able to keep it down. With repeated nausea and vomiting. No relief with zofran. MD made aware.

## 2015-12-06 NOTE — ED Notes (Signed)
POCT CBG resulted 166; Erin, RN aware

## 2015-12-06 NOTE — Progress Notes (Signed)
PROGRESS NOTE  [name] [id number] DOB: November 26, 1945 DOA: 12/05/2015 PCP: [name], NP   LOS: 0 days   Brief Narrative: Patient is a 70 y.o. male with recent history of 4 vessel CABG and bioprosthetic aortic valve replacement in September 2015, following which he has had 2 hospitalizations for vomiting, orthostatic hypotension and UTI-admitted again on 10/17 for recurrent vomiting. He underwent further evaluation with EGD on 12/02/15 without findings to account for vomiting. CT had no significant findings. Patient discharged on 12/03/15. He presented to the ED on 12/05/15 for sore throat that has gotten progressively worse over 3 days. Patient has had dysphagia and odynophagia since the EGD procedure. He reports spitting up blood and vomited 10 times yesterday. Denies fever, chills, dyspnea, chest pain, and cough. Vomiting occurs when he talks for long periods of time. Today he was able to eat broth and drink fluids. CT of soft tissue of neck on 12/06/15 shows apposed vocal cords, most compatible with phonation though, there could be a component of laryngitis, 6 x 12 mm RIGHT sublingual gland sialolith without sialoadenitis, and persistent LEFT lung atelectasis, with pleural effusion.    Assessment & Plan: Principal Problem:   Uvular swelling Active Problems:   Type II diabetes mellitus with neurological manifestations, uncontrolled ([place])   Hyperlipidemia   Aortic stenosis s/p tissue valve (Sept 2017)   Hx of CABG (Sept 2017)   PAF (paroxysmal atrial fibrillation) ([facility])   Coronary artery disease involving coronary bypass graft of native heart without angina pectoris   Sore throat   Nausea & vomiting   Difficulty swallowing   Uvular swelling/dysphagia - EGD performed 12/02/15 - CT of soft tissue of neck on 12/06/15 shows apposed vocal cords, most compatible with phonation though, there could be a component of laryngitis, 6 x 12 mm RIGHT sublingual gland sialolith without  sialoadenitis, and persistent LEFT lung atelectasis, with pleural effusion.  - Continue tylenol  - MRSA PCR screen pending - Rapid strep positive, will treat with Penicillin single dose - d/w Dr. 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 Disposition Plan: TBD  Consultants:   ENT  Procedures:   None  Antimicrobials:  None  Subjective: 70 yo male presents with sore throat. Patient has had dysphagia and odynophagia since the EGD procedure 12/02/15. He reports spitting up blood and vomited 10 times yesterday. Denies fever, chills, dyspnea, chest pain, and cough. Vomiting occurs when he talks for long periods of time. Today he was able to eat broth and drink fluids.   Objective: Vitals:   12/06/15 0715 12/06/15 0800 12/06/15 0900 12/06/15 0915  BP: 123/67 122/67 129/70 131/74  Pulse: 81 78 77 80  Resp: 17 16 14 18   Temp:    98.1 F (36.7 C)  TempSrc:    Oral  SpO2: 96% 98% 97% 96%  Weight:      Height:        Intake/Output Summary (Last 24 hours) at 12/06/15 1020 Last data filed at 12/06/15 1015  Gross per 24 hour  Intake  503 ml  Output                0 ml  Net              503 ml   Filed Weights   12/05/15 2129  Weight: 83.9 kg (185 lb)    Examination: Constitutional: NAD Vitals:   12/06/15 0715 12/06/15 0800 12/06/15 0900 12/06/15 0915  BP: 123/67 122/67 129/70 131/74  Pulse: 81 78 77 80  Resp: 17 16 14 18   Temp:    98.1 F (36.7 C)  TempSrc:    Oral  SpO2: 96%  98% 97% 96%  Weight:      Height:       General: No acute distress or gross abnormalities. Patient is spitting up saliva in room and has difficulty speaking Eyes:  lids and conjunctivae normal ENMT: Mucous membranes are moist. Uvula is enlarged, flat, and pale. No bloody secretions.  Neck: normal, supple, no masses, no thyromegaly Respiratory: clear to auscultation bilaterally, no wheezing, no crackles. Normal respiratory effort. No accessory muscle use.  Cardiovascular: Regular rate and rhythm. 3/6 SEM, soft click  Abdomen: no tenderness. Bowel sounds positive.   Data Reviewed: I have personally reviewed following labs and imaging studies  CBC:  Recent Labs Lab 11/30/15 1127 12/01/15 0750 12/06/15 0022 12/06/15 0028 12/06/15 0440  WBC 11.6* 8.7 10.9*  --  9.4  NEUTROABS 8.7*  --  7.9*  --   --   HGB 11.2* 10.0* 10.9* 11.9* 10.4*  HCT 34.7* 30.5* 32.8* 35.0* 32.3*  MCV 87.2 86.6 86.1  --  86.8  PLT 342 305 276  --  [id number]   Basic Metabolic Panel:  Recent Labs Lab 11/30/15 1127 12/01/15 0750 12/06/15 0028 12/06/15 0440  NA 137 138 136 135  K 4.4 4.4 4.1 4.4  CL 100* 104 99* 99*  CO2 27 26  --  29  GLUCOSE 155* 124* 174* 180*  BUN 5* 5* 9 7  CREATININE 1.08 0.96 1.10 1.16  CALCIUM 9.8 9.4  --  9.3   GFR: Estimated Creatinine Clearance: 66.9 mL/min (by C-G formula based on SCr of 1.16 mg/dL). Liver Function Tests:  Recent Labs Lab 11/30/15 1127  AST 21  ALT 18  ALKPHOS 82  BILITOT 0.5  PROT 7.9  ALBUMIN 3.4*   No results for input(s): LIPASE, AMYLASE in the last 168 hours. No results for input(s): AMMONIA in the last 168 hours. Coagulation Profile: No results for input(s): INR, PROTIME in the last 168 hours. Cardiac Enzymes: No results for input(s): CKTOTAL, CKMB, CKMBINDEX, TROPONINI in the last 168 hours. BNP (last 3 results) No results for input(s): PROBNP in the last 8760 hours. HbA1C: No results for input(s): HGBA1C in the last 72  hours. CBG:  Recent Labs Lab 12/02/15 1623 12/02/15 2114 12/03/15 1132 12/03/15 1633 12/06/15 0811  GLUCAP 112* 147* 152* 187* 166*   Lipid Profile: No results for input(s): CHOL, HDL, LDLCALC, TRIG, CHOLHDL, LDLDIRECT in the last 72 hours. Thyroid Function Tests: No results for input(s): TSH, T4TOTAL, FREET4, T3FREE, THYROIDAB in the last 72 hours. Anemia Panel: No results for input(s): VITAMINB12, FOLATE, FERRITIN, TIBC, IRON, RETICCTPCT in the last 72 hours. Urine analysis:    Component Value Date/Time   COLORURINE YELLOW 11/30/2015 1250   APPEARANCEUR HAZY (A) 11/30/2015 1250   LABSPEC 1.011 11/30/2015 1250   PHURINE 6.5 11/30/2015 1250   GLUCOSEU NEGATIVE 11/30/2015 1250   GLUCOSEU NEGATIVE 04/06/2010 1554   HGBUR TRACE (A)  11/30/2015 1250   [place] 11/30/2015 1250   BILIRUBINUR Negative 04/03/2014 1642   KETONESUR NEGATIVE 11/30/2015 1250   PROTEINUR 30 (A) 11/30/2015 1250   UROBILINOGEN 1.0 04/03/2014 1642   UROBILINOGEN 1.0 04/03/2014 0058   NITRITE NEGATIVE 11/30/2015 1250   LEUKOCYTESUR LARGE (A) 11/30/2015 1250   Sepsis Labs: Invalid input(s): PROCALCITONIN, LACTICIDVEN  Recent Results (from the past 240 hour(s))  Urine culture     Status: None   Collection Time: 11/30/15 12:50 PM  Result Value Ref Range Status   Specimen Description URINE, RANDOM  Final   Special Requests NONE  Final   Culture NO GROWTH 1 DAY  Final   Report Status 12/02/2015 FINAL  Final  Rapid strep screen (not at [facility])     Status: Abnormal   Collection Time: 12/06/15  9:20 AM  Result Value Ref Range Status   Streptococcus, Group A Screen (Direct) POSITIVE (A) NEGATIVE Final      Radiology Studies: Ct Soft Tissue Neck W Contrast  Result Date: 12/06/2015 CLINICAL DATA:  Sore throat, difficulty swallowing for 3 days after endoscopy. EXAM: CT NECK WITH CONTRAST TECHNIQUE: Multidetector CT imaging of the neck was performed using the standard protocol following the  bolus administration of intravenous contrast. CONTRAST:  43mL ISOVUE-300 IOPAMIDOL (ISOVUE-300) INJECTION 61% COMPARISON:  Chest radiograph November 30, 2015 and CT HEAD November 24, 2015 and CT chest August 27, 2015 FINDINGS: Pharynx and larynx: Secretions/debris in LEFT piriform sinus. Sub cm RIGHT laryngocele apposition of the true vocal cords most compatible with phonation, however true vocal cords appeared mildly thickened. Airway is patent. Elongated RIGHT superior cornu thyroid cartilage. Salivary glands: 6 x 12 mm RIGHT sublingual gland sialolith without sialoadenitis. Major salivary glands are otherwise unremarkable. Thyroid: Normal. Lymph nodes: 14 mm LEFT supraclavicular lymph node versus venous structure. Vascular: Mild calcific atherosclerosis of the carotid bifurcations. Limited intracranial: Normal. Visualized orbits: Normal. Mastoids and visualized paranasal sinuses: Well aerated. Skeleton: Patient is edentulous. Status post median sternotomy. No destructive bony lesions. Cold LEFT clavicle fracture. Upper chest: LEFT lung volume loss with pleural effusion and prominent pulmonary vessels. LEFT calcified pleural plaques. Re- demonstration of ascending aortic aneurysm, mild cardiomegaly and coronary artery calcifications. Other: None. IMPRESSION: Apposed vocal cords, most compatible with phonation though, there could be a component of laryngitis. Secretions in LEFT piriform sinus. 6 x 12 mm RIGHT sublingual gland sialolith without sialoadenitis. Persistent LEFT lung atelectasis, with pleural effusion. 14 mm LEFT supraclavicular lymph node versus venous structures. Recommend follow-up CT of the chest with contrast on a nonemergent basis for further characterization. Electronically Signed   By: Elon Alas M.D.   On: 12/06/2015 02:24   Scheduled Meds: . sodium chloride   Intravenous STAT  . famotidine (PEPCID) IV  20 mg Intravenous Q12H  . insulin aspart  0-9 Units Subcutaneous TID WC  . insulin  glargine  15 Units Subcutaneous Q2200  . sodium chloride flush  3 mL Intravenous Q12H    Marzetta Board, MD, PhD [facility] Pager [phone]  If 7PM-7AM, please contact night-coverage [email or web address] Password [email or web address] 12/06/2015, 10:20 AM

## 2015-12-06 NOTE — H&P (Signed)
History and Physical    [name] [id number] DOB: 01/22/1946 DOA: 12/05/2015  Referring MD/NP/PA:   PCP: [name], NP   Patient coming from:  The patient is coming from home.  At baseline, pt is independent for most of ADL.   Chief Complaint:  Sore throat, difficulty swallowing  HPI: [name] is a 70 y.o. male with medical history significant of hyperlipidemia, diabetes mellitus, s/p of CABG and AVR on 10/29/15, A fib on Xarelto, who presents with sore throat and difficulty swallowing.  Pt states that he developed sore throat, difficulty swallowin after he had endoscopy 3 days ago. He also spits up blood. Pt reports he was able to take medications and swallow broth this morning, but is having difficulty handling secretions.   Patient was recently hospitalized from 10/17-10/20 due to nausea and vomiting with unclear etiology. He had an negative EGD. He states that he continues to have ausea and vomiting. He vomited more 10 times today.  Patient denies abdominal pain or diarrhea. No chest pain, shortness of breath, fever, chills, symptoms of a UTI or unilateral weakness. Pt was found to have significantly enlarged uvula and seems to have uvular hematoma on physical examination.  ED Course: pt was found to have  WBC 10.9, stable hemoglobin, lactate 1.47, electrolytes and renal function o oxygen saturation 93% on RA. Pt admitted to SDU as inpt. EDP discussed with GI who report that trauma to the uvula during the procedure was possible but less likely.    # CT of neck soft tissue showed:  apposed vocal cords, most compatible with phonation though, there could be a component of laryngitis. Secretions in LEFT piriform sinus. 6 x 12 mm RIGHT sublingual gland sialolith without sialoadenitis. persistent LEFT lung atelectasis, with pleural effusion. 14 mm LEFT supraclavicular lymph node versus venous structures.   Review of Systems:   General: no fevers, chills, no changes in body  weight, has poor appetite, has fatigue HEENT: no blurry vision, hearing changes. Has sore throat and difficult swallowing. Respiratory: no dyspnea, coughing, wheezing CV: no chest pain, no palpitations GI: has nausea, vomiting, no abdominal pain, diarrhea, constipation GU: no dysuria, burning on urination, increased urinary frequency, hematuria  Ext: no leg edema Neuro: no unilateral weakness, numbness, or tingling, no vision change or hearing loss Skin: no rash, no skin tear. MSK: No muscle spasm, no deformity, no limitation of range of movement in spin Heme: No easy bruising.  Travel history: No recent long distant travel.  Allergy:  Allergies  Allergen Reactions  . No Known Allergies     Past Medical History:  Diagnosis Date  . 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 Carpal tunnel syndrome 09/24/2014   Bilateral  . 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 Diabetes mellitus, type 2 ([place])   . Erectile dysfunction   . Essential hypertension   . Hyperlipidemia     Past Surgical History:  Procedure Laterality Date  . AORTIC VALVE REPLACEMENT N/A 10/29/2015   Procedure: AORTIC VALVE REPLACEMENT (AVR) WITH 23MM MAGNA EASE TISSUE VALVE.;  Surgeon: [name], MD;  Location: [facility];  Service: Open Heart Surgery;  Laterality: N/A;  . CARDIAC CATHETERIZATION N/A 10/27/2015   Procedure: Left Heart Cath and Coronary Angiography;  Surgeon: [name], MD;  Location: [facility];  Service: Cardiovascular;  Laterality: N/A;  . CORONARY ARTERY BYPASS GRAFT N/A 10/29/2015  Procedure: CORONARY ARTERY BYPASS GRAFTING (CABG) x Four UTILIZING THE LEFT INTERNAL MAMMARY ARTERY AND ENDOSCOPICALLY HARVESTED RIGHT SAPEHENEOUS VEINS.;  Surgeon: [name], MD;  Location: [place];  Service: Open Heart Surgery;  Laterality: N/A;  . CYST REMOVAL HAND Right   . ESOPHAGOGASTRODUODENOSCOPY N/A 12/02/2015   Procedure:  ESOPHAGOGASTRODUODENOSCOPY (EGD);  Surgeon: [name], MD;  Location: [place];  Service: Gastroenterology;  Laterality: N/A;  . REPLACEMENT ASCENDING AORTA N/A 10/29/2015   Procedure: REPLACEMENT OF ASCENDING AORTA USING 34MM X 30CM WOVEN DOUBLE VELOUR VASCULAR GRAFT.;  Surgeon: [name], MD;  Location: [facility];  Service: Open Heart Surgery;  Laterality: N/A;  . TEE WITHOUT CARDIOVERSION N/A 10/29/2015   Procedure: TRANSESOPHAGEAL ECHOCARDIOGRAM (TEE);  Surgeon: [name], MD;  Location: [place];  Service: Open Heart Surgery;  Laterality: N/A;    Social History:  reports that he has never smoked. He has never used smokeless tobacco. He reports that he does not drink alcohol or use drugs.  Family History:  Family History  Problem Relation Age of Onset  . Cancer Father     Throat cancer  . Diabetes Sister      Prior to Admission medications   Medication Sig Start Date End Date Taking? Authorizing Provider  amiodarone (PACERONE) 200 MG tablet Take 1 tablet (200 mg total) by mouth daily. 11/23/15  Yes Thayer Headings, MD  atorvastatin (LIPITOR) 40 MG tablet Take 1 tablet (40 mg total) by mouth daily. 07/29/15  Yes Thayer Headings, MD  B-D ULTRAFINE III SHORT PEN 31G X 8 MM MISC USE 4 TIMES DAILY AS DIRECTED 11/30/15  Yes [name], NP  ferrous gluconate (FERGON) 324 MG tablet Take 1 tablet (324 mg total) by mouth 2 (two) times daily with a meal. 11/08/15  Yes Wayne E Gold, PA-C  folic acid (FOLVITE) 1 MG tablet Take 1 tablet (1 mg total) by mouth daily. 11/08/15  Yes Wayne E Gold, PA-C  glucose blood test strip Use as instructed 12/25/14  Yes [name], NP  Insulin Glargine (LANTUS SOLOSTAR) 100 UNIT/ML Solostar Pen Inject 20 Units into the skin daily at 10 pm. Patient taking differently: Inject 20 Units into the skin at bedtime.  11/27/15  Yes Reyne Dumas, MD  insulin lispro (HUMALOG KWIKPEN) 100 UNIT/ML KiwkPen Use 5 to 10 units three times daily 10-15  minutes before meals Patient taking differently: Inject 5-10 Units into the skin 3 (three) times daily before meals.  09/09/14  Yes Doe-Hyun R Shawna Orleans, DO  metFORMIN (GLUCOPHAGE) 1000 MG tablet Take 1 tablet (1,000 mg total) by mouth 2 (two) times daily with a meal. 09/07/15  Yes [name], NP  midodrine (PROAMATINE) 10 MG tablet Take 1 tablet (10 mg total) by mouth 3 (three) times daily with meals. 11/20/15  Yes Modena Jansky, MD  ondansetron (ZOFRAN ODT) 4 MG disintegrating tablet Take 1 tablet (4 mg total) by mouth every 8 (eight) hours as needed for nausea or vomiting. 12/03/15  Yes Shanker Kristeen Mans, MD  pantoprazole (PROTONIX) 40 MG tablet Take 1 tablet (40 mg total) by mouth daily at 12 noon. 12/03/15  Yes Shanker Kristeen Mans, MD  rivaroxaban (XARELTO) 20 MG TABS tablet Take 1 tablet (20 mg total) by mouth daily with supper. 11/08/15  Yes Erin R [name], PA-C  saccharomyces boulardii (FLORASTOR) 250 MG capsule Take 1 capsule (250 mg total) by mouth 2 (two) times daily. 11/27/15 12/07/15 Yes Reyne Dumas, MD  famotidine (PEPCID AC) 10  MG chewable tablet Chew 1 tablet (10 mg total) by mouth 2 (two) times daily as needed for heartburn. Patient not taking: Reported on 12/05/2015 10/11/15   Thayer Headings, MD    Physical Exam: Vitals:   12/06/15 0545 12/06/15 0600 12/06/15 0615 12/06/15 0715  BP: 127/70 134/72 134/70 123/67  Pulse: 80 81 84 81  Resp: 17 17 18 17   Temp:      TempSrc:      SpO2: 95% 98% 98% 96%  Weight:      Height:       General: Not in acute distress HEENT:       Eyes: PERRL, EOMI, no scleral icterus.       ENT: No discharge from the ears and nose, no pharynx injection, no tonsillar enlargement. Uvula is enlarged, engorged and seems to have hematoma in the lower part.       Neck: No JVD, no bruit, no mass felt. Heme: No neck lymph node enlargement. Cardiac: S1/S2, RRR, No murmurs, No gallops or rubs. Respiratory: No rales, wheezing, rhonchi or rubs. GI: Soft,  nondistended, nontender, no rebound pain, no organomegaly, BS present. GU: No hematuria Ext: No pitting leg edema bilaterally. 2+DP/PT pulse bilaterally. Musculoskeletal: No joint deformities, No joint redness or warmth, no limitation of ROM in spin. Skin: No rashes.  Neuro: Alert, oriented X3, cranial nerves II-XII grossly intact, moves all extremities normally. Psych: Patient is not psychotic, no suicidal or hemocidal ideation.  Labs on Admission: I have personally reviewed following labs and imaging studies  CBC:  Recent Labs Lab 11/30/15 1127 12/01/15 0750 12/06/15 0022 12/06/15 0028 12/06/15 0440  WBC 11.6* 8.7 10.9*  --  9.4  NEUTROABS 8.7*  --  7.9*  --   --   HGB 11.2* 10.0* 10.9* 11.9* 10.4*  HCT 34.7* 30.5* 32.8* 35.0* 32.3*  MCV 87.2 86.6 86.1  --  86.8  PLT 342 305 276  --  [id number]   Basic Metabolic Panel:  Recent Labs Lab 11/30/15 1127 12/01/15 0750 12/06/15 0028 12/06/15 0440  NA 137 138 136 135  K 4.4 4.4 4.1 4.4  CL 100* 104 99* 99*  CO2 27 26  --  29  GLUCOSE 155* 124* 174* 180*  BUN 5* 5* 9 7  CREATININE 1.08 0.96 1.10 1.16  CALCIUM 9.8 9.4  --  9.3   GFR: Estimated Creatinine Clearance: 66.9 mL/min (by C-G formula based on SCr of 1.16 mg/dL). Liver Function Tests:  Recent Labs Lab 11/30/15 1127  AST 21  ALT 18  ALKPHOS 82  BILITOT 0.5  PROT 7.9  ALBUMIN 3.4*   No results for input(s): LIPASE, AMYLASE in the last 168 hours. No results for input(s): AMMONIA in the last 168 hours. Coagulation Profile: No results for input(s): INR, PROTIME in the last 168 hours. Cardiac Enzymes: No results for input(s): CKTOTAL, CKMB, CKMBINDEX, TROPONINI in the last 168 hours. BNP (last 3 results) No results for input(s): PROBNP in the last 8760 hours. HbA1C: No results for input(s): HGBA1C in the last 72 hours. CBG:  Recent Labs Lab 12/02/15 1131 12/02/15 1623 12/02/15 2114 12/03/15 1132 12/03/15 1633  GLUCAP 141* 112* 147* 152* 187*   Lipid  Profile: No results for input(s): CHOL, HDL, LDLCALC, TRIG, CHOLHDL, LDLDIRECT in the last 72 hours. Thyroid Function Tests: No results for input(s): TSH, T4TOTAL, FREET4, T3FREE, THYROIDAB in the last 72 hours. Anemia Panel: No results for input(s): VITAMINB12, FOLATE, FERRITIN, TIBC, IRON, RETICCTPCT in the last 72 hours. Urine  analysis:    Component Value Date/Time   COLORURINE YELLOW 11/30/2015 1250   APPEARANCEUR HAZY (A) 11/30/2015 1250   LABSPEC 1.011 11/30/2015 1250   PHURINE 6.5 11/30/2015 1250   GLUCOSEU NEGATIVE 11/30/2015 1250   GLUCOSEU NEGATIVE 04/06/2010 1554   HGBUR TRACE (A) 11/30/2015 1250   BILIRUBINUR NEGATIVE 11/30/2015 1250   BILIRUBINUR Negative 04/03/2014 1642   KETONESUR NEGATIVE 11/30/2015 1250   PROTEINUR 30 (A) 11/30/2015 1250   UROBILINOGEN 1.0 04/03/2014 1642   UROBILINOGEN 1.0 04/03/2014 0058   NITRITE NEGATIVE 11/30/2015 1250   LEUKOCYTESUR LARGE (A) 11/30/2015 1250   Sepsis Labs: @LABRCNTIP (procalcitonin:4,lacticidven:4) ) Recent Results (from the past 240 hour(s))  Urine culture     Status: None   Collection Time: 11/30/15 12:50 PM  Result Value Ref Range Status   Specimen Description URINE, RANDOM  Final   Special Requests NONE  Final   Culture NO GROWTH 1 DAY  Final   Report Status 12/02/2015 FINAL  Final     Radiological Exams on Admission: Ct Soft Tissue Neck W Contrast  Result Date: 12/06/2015 CLINICAL DATA:  Sore throat, difficulty swallowing for 3 days after endoscopy. EXAM: CT NECK WITH CONTRAST TECHNIQUE: Multidetector CT imaging of the neck was performed using the standard protocol following the bolus administration of intravenous contrast. CONTRAST:  74mL ISOVUE-300 IOPAMIDOL (ISOVUE-300) INJECTION 61% COMPARISON:  Chest radiograph November 30, 2015 and CT HEAD November 24, 2015 and CT chest August 27, 2015 FINDINGS: Pharynx and larynx: Secretions/debris in LEFT piriform sinus. Sub cm RIGHT laryngocele apposition of the true vocal  cords most compatible with phonation, however true vocal cords appeared mildly thickened. Airway is patent. Elongated RIGHT superior cornu thyroid cartilage. Salivary glands: 6 x 12 mm RIGHT sublingual gland sialolith without sialoadenitis. Major salivary glands are otherwise unremarkable. Thyroid: Normal. Lymph nodes: 14 mm LEFT supraclavicular lymph node versus venous structure. Vascular: Mild calcific atherosclerosis of the carotid bifurcations. Limited intracranial: Normal. Visualized orbits: Normal. Mastoids and visualized paranasal sinuses: Well aerated. Skeleton: Patient is edentulous. Status post median sternotomy. No destructive bony lesions. Cold LEFT clavicle fracture. Upper chest: LEFT lung volume loss with pleural effusion and prominent pulmonary vessels. LEFT calcified pleural plaques. Re- demonstration of ascending aortic aneurysm, mild cardiomegaly and coronary artery calcifications. Other: None. IMPRESSION: Apposed vocal cords, most compatible with phonation though, there could be a component of laryngitis. Secretions in LEFT piriform sinus. 6 x 12 mm RIGHT sublingual gland sialolith without sialoadenitis. Persistent LEFT lung atelectasis, with pleural effusion. 14 mm LEFT supraclavicular lymph node versus venous structures. Recommend follow-up CT of the chest with contrast on a nonemergent basis for further characterization. Electronically Signed   By: Elon Alas M.D.   On: 12/06/2015 02:24     EKG: Not done in ED, will get one.   Assessment/Plan Principal Problem:   Uvular swelling Active Problems:   Type II diabetes mellitus with neurological manifestations, uncontrolled ([place])   Hyperlipidemia   Aortic stenosis s/p tissue valve (Sept 2017)   Hx of CABG (Sept 2017)   PAF (paroxysmal atrial fibrillation) ([facility])   Coronary artery disease involving coronary bypass graft of native heart without angina pectoris   Sore throat   Nausea & vomiting   Difficulty  swallowing   Uvular swelling: Patient is seems to have uvular hematoma. May be due to recent endoscopy-induced injury and in the setting of taking Xarelto. Patient is spitting blood, which is most likely due to oozing blood from uvular rather than GI bleeding. Hemodynamically stable. Hemoglobin  stable. Airway is protected. Patient does not have fever. WBC is only 10.9. Clinically does not seem to have infection.  -Will admit to stepdown as inpatient. (This patient has multiple chronic comorbidities as listed in HPI. Now patient has significant uvular swelling, and the risk of airway compromise. Patient requires inpatient status due to high intensity of service, high risk for further deterioration and high frequency of surveillance required) -will check Rapid strep -hold Xarelto -please call ENT in am -hold oral meds  DM-II: Last A1c 11.2 on 10/29/15, poorly controled. Patient is taking Lantus, Humalog, metformin at home -will decrease Lantus dose from  20-15 units daily -SSI  HLD: Last LDL was 115 on 07/23/15 -Hold Lipitor  Hx of CAD, s/p AVR and CABG (Sept 2017): No CP -hold lipitor and Xarelto as above  Atrial Fibrillation: CHA2DS2-VASc Score is 4, needs oral anticoagulation. Patient is on Xarelto at home. INR is 1.47 on admission. Heart rate is ~90-100 -hold Xarelto as above -hold amiodarone (will not start IV form due to last half life) -start prn Metoprolol by IV  Nausea & vomiting: no AP. Pt had extensive workup in the recent admission. No clear etiology was found. Per previous discharge summary by Dr. Sonia Side, "CT of the abdomen with no evidence of gastric outlet obstruction. GI consulted, underwent endoscopy without any findings that would account for recurrent vomiting. Subsequently underwent gastric emptying study that is negative as well. Abdomen is benign, no further vomiting since admission, spoke with GI PA Fabienne Bruns to discharge, and GI will arrange for outpatient  follow-up. If vomiting recurs in the future, may need to do a HIDA scan with EF to rule out biliary dyskinesia-although vomiting is not related to oral intake"  -Symptomatic treatment: Zofran for nausea  -IV fluid: Normal saline 500 mL, then 75 mL per hour  GERD: -Pepcid IV  DVT ppx: SCD Code Status: Full code Family Communication:  Yes, patient's wife and daguhter at bed side Disposition Plan:  Anticipate discharge back to previous home environment Consults called:  none Admission status: SDU/inpation       Date of Service 12/06/2015    Ivor Costa [facility] Pager [phone]  If 7PM-7AM, please contact night-coverage [email or web address] Password [facility] 12/06/2015, 7:39 AM

## 2015-12-07 ENCOUNTER — Observation Stay (HOSPITAL_COMMUNITY): Payer: Medicare Other

## 2015-12-07 DIAGNOSIS — K1379 Other lesions of oral mucosa: Secondary | ICD-10-CM | POA: Diagnosis not present

## 2015-12-07 LAB — GLUCOSE, CAPILLARY
GLUCOSE-CAPILLARY: 140 mg/dL — AB (ref 65–99)
Glucose-Capillary: 111 mg/dL — ABNORMAL HIGH (ref 65–99)
Glucose-Capillary: 123 mg/dL — ABNORMAL HIGH (ref 65–99)
Glucose-Capillary: 190 mg/dL — ABNORMAL HIGH (ref 65–99)

## 2015-12-07 MED ORDER — METOPROLOL TARTRATE 12.5 MG HALF TABLET
12.5000 mg | ORAL_TABLET | Freq: Two times a day (BID) | ORAL | Status: DC
  Administered 2015-12-07 – 2015-12-11 (×8): 12.5 mg via ORAL
  Filled 2015-12-07 (×8): qty 1

## 2015-12-07 MED ORDER — FAMOTIDINE 20 MG PO TABS
20.0000 mg | ORAL_TABLET | Freq: Two times a day (BID) | ORAL | Status: DC
  Administered 2015-12-07 – 2015-12-11 (×8): 20 mg via ORAL
  Filled 2015-12-07 (×8): qty 1

## 2015-12-07 MED ORDER — RIVAROXABAN 20 MG PO TABS
20.0000 mg | ORAL_TABLET | Freq: Every day | ORAL | Status: DC
  Administered 2015-12-07: 20 mg via ORAL
  Filled 2015-12-07: qty 1

## 2015-12-07 MED ORDER — TECHNETIUM TC 99M MEBROFENIN IV KIT
5.0000 | PACK | Freq: Once | INTRAVENOUS | Status: AC | PRN
Start: 2015-12-07 — End: 2015-12-07
  Administered 2015-12-07: 5 via INTRAVENOUS

## 2015-12-07 NOTE — Progress Notes (Signed)
Patient transferred via wheelchair to 6N with belongings, including cell phone and clothing. Wife at bedside, aware of transfer. Patient will be on telemetry, CCMD notified of transfer.   Milford Cage, RN

## 2015-12-07 NOTE — Care Management Obs Status (Signed)
[place] NOTIFICATION   Patient Details  Name: [name] MRN: [id number] Date of Birth: [birth year]   Medicare Observation Status Notification Given:  Yes    MayoKym Groom, RN 12/07/2015, 11:44 AM

## 2015-12-07 NOTE — Progress Notes (Addendum)
Report given to [place], RN on 6N. Patient will be transferred when he returns from Nuclear Medicine.   Milford Cage, RN

## 2015-12-07 NOTE — Care Management CC44 (Signed)
Condition Code 44 Documentation Completed  Patient Details  Name: [name] MRN: [id number] Date of Birth: 1945/11/07   Condition Code 44 given:  Yes Patient signature on Condition Code 44 notice:  Yes Documentation of 2 MD's agreement:  Yes Code 44 added to claim:  Yes    Girard Cooter, RN 12/07/2015, 11:46 AM

## 2015-12-07 NOTE — Progress Notes (Addendum)
PROGRESS NOTE  [name] [id number] DOB: 05/29/1945 DOA: 12/05/2015 PCP: [name], NP   LOS: 1 day   Brief Narrative: Patient is a 70 y.o. male with recent history of 4 vessel CABG and bioprosthetic aortic valve replacement in September 2015, following which he has had 2 hospitalizations for vomiting, orthostatic hypotension and UTI-admitted again on 10/17 for recurrent vomiting. He underwent further evaluation with EGD on 12/02/15 without findings to account for vomiting. CT had no significant findings. Patient discharged on 12/03/15. He presented to the ED on 12/05/15 for sore throat that has gotten progressively worse over 3 days. Patient has had dysphagia and odynophagia since the EGD procedure. He reports spitting up blood and vomited 10 times yesterday. Denies fever, chills, dyspnea, chest pain, and cough. Vomiting occurs when he talks for long periods of time. Today he was able to eat broth and drink fluids. CT of soft tissue of neck on 12/06/15 shows apposed vocal cords, most compatible with phonation though, there could be a component of laryngitis, 6 x 12 mm RIGHT sublingual gland sialolith without sialoadenitis, and persistent LEFT lung atelectasis, with pleural effusion.    Assessment & Plan: Principal Problem:   Uvular swelling Active Problems:   Type II diabetes mellitus with neurological manifestations, uncontrolled ([place])   Hyperlipidemia   Aortic stenosis s/p tissue valve (Sept 2017)   Hx of CABG (Sept 2017)   PAF (paroxysmal atrial fibrillation) (HCC)   Coronary artery disease involving coronary bypass graft of native heart without angina pectoris   Sore throat   Nausea & vomiting   Difficulty swallowing  Nausea/vomiting and orthostatic hypotension - Patient has been having intractable nausea and vomiting and orthostatic hypotension ever since he had his CABG couple of months ago. He was feeling fine prior to that. He was worked up extensively in the past  without any clear answers, and patient is continuing to be symptomatic - he was worked up for adrenal insufficiency with a normal ACTH stimulation test  - GI was consulted and have seen the patient last hospitalization, patient underwent an EGD which was unremarkable  - he also had Gastric emptying study which was unremarkable as well  - Will do a HIDA scan this morning to rule out any gallbladder related issues  - Continue zofran 4mg  prn - Continue pepcid 20mg  Q12H  - ?I wonder whether this is related to Amidoarone - rare, but some case reports, will hold for now. Discussed with Dr. Acie Fredrickson, he agrees with holding amiodarone now that patient is 2 months post op. Reccomends low dose BB, will start Metoprolol 12.5 and monitor on telemetry. If his symptoms are amiodarone induced, his orthostasis should resolve when Amio is out of his system (may be few weeks). He is not hypotensive and his BP is in the 130-140s currently, continue to hold Midodrine. Will order morning orthostatics  Uvular swelling/dysphagia - EGD performed 12/02/15 ?whether this caused local irritation - patient still with difficulties this morning, feels like has a scratchy throat when eating. Able to drink and no breathing diffilcuties - Rapid strep was positive, s/p treatment with single im Penicillin dose - d/w Dr. 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 Disposition Plan: TBD  Consultants:   ENT (phone)  Procedures:    None  Antimicrobials:  Penicillin IM 1.2 mil U x 1  Subjective: -  ongoing nausea and vomiting, last episode of vomiting last night after eating tomato soup   Objective: Vitals:   12/06/15 1937 12/06/15 2356 12/07/15 0515 12/07/15 0743  BP: 130/68 (!) 143/83  118/68  Pulse: 79 91    Resp: 12 16    Temp: 98.6 F (37 C) 98.3 F (36.8 C) 98.2 F (36.8 C) 98.2 F (36.8 C)  TempSrc: Oral Oral Oral Oral  SpO2: 98% 100%    Weight:      Height:        Intake/Output Summary (Last 24 hours) at 12/07/15 1115 Last data filed at 12/07/15 0516  Gross per 24 hour  Intake              264 ml  Output              775 ml  Net             -511 ml   [facility] Weights   12/05/15 2129 12/06/15 0915  Weight: 83.9 kg (185 lb) 78.2 kg (172 lb 6.4 oz)    Examination: Constitutional: NAD Vitals:   12/06/15 1937 12/06/15 2356 12/07/15 0515 12/07/15 0743  BP: 130/68 (!) 143/83  118/68  Pulse: 79 91    Resp: 12 16    Temp: 98.6 F (37 C) 98.3 F (36.8 C) 98.2 F (36.8 C) 98.2 F (36.8 C)  TempSrc: Oral Oral Oral Oral  SpO2: 98% 100%    Weight:      Height:       General: No acute distress or gross abnormalities. Patient is spitting up saliva in room and has difficulty speaking Eyes:  lids and conjunctivae normal ENMT: Mucous membranes are moist. Uvula is enlarged, flat, and pale. No bloody secretions.  Neck: normal, supple, no masses, no thyromegaly Respiratory: clear to auscultation bilaterally, no wheezing, no crackles. Normal respiratory effort. No accessory muscle use.  Cardiovascular: Regular rate and rhythm. 3/6 SEM, soft click  Abdomen: no tenderness. Bowel sounds positive.   Data Reviewed: I have personally reviewed following labs and imaging studies  CBC:  Recent Labs Lab 11/30/15 1127 12/01/15 0750 12/06/15 0022 12/06/15 0028 12/06/15 0440  WBC 11.6* 8.7 10.9*  --  9.4  NEUTROABS 8.7*  --  7.9*  --   --   HGB 11.2* 10.0* 10.9* 11.9* 10.4*  HCT 34.7* 30.5*  32.8* 35.0* 32.3*  MCV 87.2 86.6 86.1  --  86.8  PLT 342 305 276  --  [id number]   Basic Metabolic Panel:  Recent Labs Lab 11/30/15 1127 12/01/15 0750 12/06/15 0028 12/06/15 0440  NA 137 138 136 135  K 4.4 4.4 4.1 4.4  CL 100* 104 99* 99*  CO2 27 26  --  29  GLUCOSE 155* 124* 174* 180*  BUN 5* 5* 9 7  CREATININE 1.08 0.96 1.10 1.16  CALCIUM 9.8 9.4  --  9.3   GFR: Estimated Creatinine Clearance: 66.9 mL/min (by C-G formula based on SCr of 1.16 mg/dL). Liver Function Tests:  Recent Labs Lab 11/30/15 1127  AST 21  ALT 18  ALKPHOS 82  BILITOT 0.5  PROT 7.9  ALBUMIN 3.4*   No results for input(s): LIPASE, AMYLASE in the  last 168 hours. No results for input(s): AMMONIA in the last 168 hours. Coagulation Profile: No results for input(s): INR, PROTIME in the last 168 hours. Cardiac Enzymes: No results for input(s): CKTOTAL, CKMB, CKMBINDEX, TROPONINI in the last 168 hours. BNP (last 3 results) No results for input(s): PROBNP in the last 8760 hours. HbA1C: No results for input(s): HGBA1C in the last 72 hours. CBG:  Recent Labs Lab 12/06/15 0811 12/06/15 1241 12/06/15 1713 12/06/15 1944 12/07/15 0748  GLUCAP 166* 142* 158* 152* 111*   Lipid Profile: No results for input(s): CHOL, HDL, LDLCALC, TRIG, CHOLHDL, LDLDIRECT in the last 72 hours. Thyroid Function Tests: No results for input(s): TSH, T4TOTAL, FREET4, T3FREE, THYROIDAB in the last 72 hours. Anemia Panel: No results for input(s): VITAMINB12, FOLATE, FERRITIN, TIBC, IRON, RETICCTPCT in the last 72 hours. Urine analysis:    Component Value Date/Time   COLORURINE YELLOW 11/30/2015 1250   APPEARANCEUR HAZY (A) 11/30/2015 1250   LABSPEC 1.011 11/30/2015 1250   PHURINE 6.5 11/30/2015 1250   GLUCOSEU NEGATIVE 11/30/2015 1250   GLUCOSEU NEGATIVE 04/06/2010 1554   HGBUR TRACE (A) 11/30/2015 1250   BILIRUBINUR NEGATIVE 11/30/2015 1250   BILIRUBINUR Negative 04/03/2014 1642   KETONESUR NEGATIVE 11/30/2015 1250    PROTEINUR 30 (A) 11/30/2015 1250   UROBILINOGEN 1.0 04/03/2014 1642   UROBILINOGEN 1.0 04/03/2014 0058   NITRITE NEGATIVE 11/30/2015 1250   LEUKOCYTESUR LARGE (A) 11/30/2015 1250   Sepsis Labs: Invalid input(s): PROCALCITONIN, LACTICIDVEN  Recent Results (from the past 240 hour(s))  Urine culture     Status: None   Collection Time: 11/30/15 12:50 PM  Result Value Ref Range Status   Specimen Description URINE, RANDOM  Final   Special Requests NONE  Final   Culture NO GROWTH 1 DAY  Final   Report Status 12/02/2015 FINAL  Final  Rapid strep screen (not at [facility])     Status: Abnormal   Collection Time: 12/06/15  9:20 AM  Result Value Ref Range Status   Streptococcus, Group A Screen (Direct) POSITIVE (A) NEGATIVE Final  MRSA PCR Screening     Status: None   Collection Time: 12/06/15 10:23 AM  Result Value Ref Range Status   MRSA by PCR NEGATIVE NEGATIVE Final    Comment:        The GeneXpert MRSA Assay (FDA approved for NASAL specimens only), is one component of a comprehensive MRSA colonization surveillance program. It is not intended to diagnose MRSA infection nor to guide or monitor treatment for MRSA infections.       Radiology Studies: Ct Soft Tissue Neck W Contrast  Result Date: 12/06/2015 CLINICAL DATA:  Sore throat, difficulty swallowing for 3 days after endoscopy. EXAM: CT NECK WITH CONTRAST TECHNIQUE: Multidetector CT imaging of the neck was performed using the standard protocol following the bolus administration of intravenous contrast. CONTRAST:  61mL ISOVUE-300 IOPAMIDOL (ISOVUE-300) INJECTION 61% COMPARISON:  Chest radiograph November 30, 2015 and CT HEAD November 24, 2015 and CT chest August 27, 2015 FINDINGS: Pharynx and larynx: Secretions/debris in LEFT piriform sinus. Sub cm RIGHT laryngocele apposition of the true vocal cords most compatible with phonation, however true vocal cords appeared mildly thickened. Airway is patent. Elongated RIGHT superior cornu thyroid  cartilage. Salivary glands: 6 x 12 mm RIGHT sublingual gland sialolith without sialoadenitis. Major salivary glands are otherwise unremarkable. Thyroid: Normal. Lymph nodes: 14 mm LEFT supraclavicular lymph node versus venous structure. Vascular: Mild calcific atherosclerosis of the carotid bifurcations. Limited intracranial: Normal. Visualized orbits: Normal. Mastoids and  visualized paranasal sinuses: Well aerated. Skeleton: Patient is edentulous. Status post median sternotomy. No destructive bony lesions. Cold LEFT clavicle fracture. Upper chest: LEFT lung volume loss with pleural effusion and prominent pulmonary vessels. LEFT calcified pleural plaques. Re- demonstration of ascending aortic aneurysm, mild cardiomegaly and coronary artery calcifications. Other: None. IMPRESSION: Apposed vocal cords, most compatible with phonation though, there could be a component of laryngitis. Secretions in LEFT piriform sinus. 6 x 12 mm RIGHT sublingual gland sialolith without sialoadenitis. Persistent LEFT lung atelectasis, with pleural effusion. 14 mm LEFT supraclavicular lymph node versus venous structures. Recommend follow-up CT of the chest with contrast on a nonemergent basis for further characterization. Electronically Signed   By: Elon Alas M.D.   On: 12/06/2015 02:24   Mr Jeri Cos [id number] Contrast  Result Date: 12/06/2015 CLINICAL DATA:  Intractable nausea and vomiting. Difficulty swallowing and sore throat after recent endoscopy. History of hypertension, hyperlipidemia, and diabetes. EXAM: MRI HEAD WITHOUT AND WITH CONTRAST TECHNIQUE: Multiplanar, multiecho pulse sequences of the brain and surrounding structures were obtained without and with intravenous contrast. CONTRAST:  2mL MULTIHANCE GADOBENATE DIMEGLUMINE 529 MG/ML IV SOLN COMPARISON:  Head CT 11/24/2015 FINDINGS: Brain: There is no evidence of acute infarct, mass, midline shift, or extra-axial fluid collection. Chronic microhemorrhages are present in  the left occipital lobe and left temporal lobe. Ventricles and sulci are normal in size. There are numerous small foci of T2 hyperintensity in the subcortical and deep cerebral white matter bilaterally which are mildly to moderately advanced for age. Some of these demonstrate mild T2 shine through on diffusion weighted imaging. The brainstem is normal in appearance. No abnormal enhancement is identified. Vascular: Major intracranial vascular flow voids are preserved. Skull and upper cervical spine: Unremarkable bone marrow signal. Sinuses/Orbits: Unremarkable orbits. Paranasal sinuses and mastoid air cells are clear. Other: None. IMPRESSION: 1. No acute intracranial abnormality or mass. 2. Mild-to-moderate cerebral white matter T2 signal changes, nonspecific but compatible with chronic small vessel ischemic disease. Electronically Signed   By: Logan Bores M.D.   On: 12/06/2015 19:29   Scheduled Meds: . famotidine (PEPCID) IV  20 mg Intravenous Q12H  . insulin aspart  0-9 Units Subcutaneous TID WC  . insulin glargine  15 Units Subcutaneous Q2200  . sodium chloride flush  3 mL Intravenous Q12H    Marzetta Board, MD, PhD [facility] Pager [phone]  If 7PM-7AM, please contact night-coverage [email or web address] Password [email or web address] 12/07/2015, 11:15 AM

## 2015-12-08 DIAGNOSIS — K1379 Other lesions of oral mucosa: Secondary | ICD-10-CM

## 2015-12-08 LAB — BASIC METABOLIC PANEL
Anion gap: 9 (ref 5–15)
BUN: 6 mg/dL (ref 6–20)
CALCIUM: 9.7 mg/dL (ref 8.9–10.3)
CHLORIDE: 99 mmol/L — AB (ref 101–111)
CO2: 29 mmol/L (ref 22–32)
CREATININE: 1.03 mg/dL (ref 0.61–1.24)
GFR calc Af Amer: >60 mL/min (ref >60)
GFR calc non Af Amer: >60 mL/min (ref >60)
GLUCOSE: 144 mg/dL — AB (ref 65–99)
Potassium: 3.6 mmol/L (ref 3.5–5.1)
Sodium: 137 mmol/L (ref 135–145)

## 2015-12-08 LAB — CBC
HEMATOCRIT: 32.4 % — AB (ref 39.0–52.0)
HEMOGLOBIN: 10.6 g/dL — AB (ref 13.0–17.0)
MCH: 28.4 pg (ref 26.0–34.0)
MCHC: 32.7 g/dL (ref 30.0–36.0)
MCV: 86.9 fL (ref 78.0–100.0)
Platelets: 304 10*3/uL (ref 150–400)
RBC: 3.73 MIL/uL — ABNORMAL LOW (ref 4.22–5.81)
RDW: 15 % (ref 11.5–15.5)
WBC: 8 10*3/uL (ref 4.0–10.5)

## 2015-12-08 LAB — GLUCOSE, CAPILLARY
GLUCOSE-CAPILLARY: 103 mg/dL — AB (ref 65–99)
GLUCOSE-CAPILLARY: 115 mg/dL — AB (ref 65–99)
Glucose-Capillary: 201 mg/dL — ABNORMAL HIGH (ref 65–99)
Glucose-Capillary: 129 mg/dL — ABNORMAL HIGH (ref 65–99)

## 2015-12-08 MED ORDER — LIDOCAINE VISCOUS 2 % MT SOLN
15.0000 mL | Freq: Once | OROMUCOSAL | Status: DC
  Filled 2015-12-08 (×2): qty 15

## 2015-12-08 MED ORDER — LIDOCAINE-EPINEPHRINE (PF) 1 %-1:200000 IJ SOLN
0.0000 mL | Freq: Once | INTRAMUSCULAR | Status: DC | PRN
  Filled 2015-12-08: qty 20

## 2015-12-08 MED ORDER — TRIPLE ANTIBIOTIC 3.5-400-5000 EX OINT
1.0000 "application " | TOPICAL_OINTMENT | Freq: Once | CUTANEOUS | Status: DC | PRN
  Filled 2015-12-08: qty 1

## 2015-12-08 MED ORDER — LIDOCAINE-EPINEPHRINE (PF) 1 %-1:200000 IJ SOLN
0.0000 mL | Freq: Once | INTRAMUSCULAR | Status: DC | PRN
  Filled 2015-12-08: qty 30

## 2015-12-08 MED ORDER — SILVER NITRATE-POT NITRATE 75-25 % EX MISC
1.0000 | Freq: Once | CUTANEOUS | Status: DC | PRN
  Filled 2015-12-08: qty 1

## 2015-12-08 MED ORDER — LIDOCAINE-EPINEPHRINE (PF) 2 %-1:200000 IJ SOLN
20.0000 mL | Freq: Once | INTRAMUSCULAR | Status: DC | PRN
  Filled 2015-12-08: qty 20

## 2015-12-08 MED ORDER — LIDOCAINE HCL 2 % EX GEL
1.0000 "application " | Freq: Once | CUTANEOUS | Status: DC | PRN
  Filled 2015-12-08: qty 5

## 2015-12-08 MED ORDER — LIDOCAINE HCL 4 % EX SOLN
0.0000 mL | Freq: Once | CUTANEOUS | Status: DC | PRN
  Filled 2015-12-08: qty 50

## 2015-12-08 MED ORDER — OXYMETAZOLINE HCL 0.05 % NA SOLN: 1.0000 | Freq: Once | NASAL | Status: DC | PRN

## 2015-12-08 MED ORDER — LIDOCAINE-EPINEPHRINE (PF) 1 %-1:200000 IJ SOLN
0.0000 mL | Freq: Once | INTRAMUSCULAR | Status: DC
  Filled 2015-12-08: qty 20

## 2015-12-08 NOTE — Progress Notes (Addendum)
PROGRESS NOTE  [name] [id number] DOB: 11-Jul-1945 DOA: 12/05/2015 PCP: [name], NP   LOS: 2 days   Brief Narrative: Patient is a 70 y.o. male with recent history of 4 vessel CABG and bioprosthetic aortic valve replacement in September 2015, following which he has had 2 hospitalizations for vomiting, orthostatic hypotension and UTI-admitted again on 10/17 for recurrent vomiting. He underwent further evaluation with EGD on 12/02/15 without findings to account for vomiting. CT had no significant findings. Patient discharged on 12/03/15. He presented to the ED on 12/05/15 for sore throat that has gotten progressively worse over 3 days. Patient has had dysphagia and odynophagia since the EGD procedure. He reports spitting up blood and vomited 10 times yesterday. Denies fever, chills, dyspnea, chest pain, and cough. Vomiting occurs when he talks for long periods of time. Today he was able to eat broth and drink fluids. CT of soft tissue of neck on 12/06/15 shows apposed vocal cords, most compatible with phonation though, there could be a component of laryngitis, 6 x 12 mm RIGHT sublingual gland sialolith without sialoadenitis, and persistent LEFT lung atelectasis, with pleural effusion.    Assessment & Plan: Principal Problem:   Uvular swelling Active Problems:   Type II diabetes mellitus with neurological manifestations, uncontrolled ([place])   Hyperlipidemia   Aortic stenosis s/p tissue valve (Sept 2017)   Hx of CABG (Sept 2017)   PAF (paroxysmal atrial fibrillation) (HCC)   Coronary artery disease involving coronary bypass graft of native heart without angina pectoris   Sore throat   Nausea & vomiting   Difficulty swallowing  Nausea/vomiting and orthostatic hypotension - Patient has been having intractable nausea and vomiting and orthostatic hypotension ever since he had his CABG couple of months ago. He was feeling fine prior to that. He was worked up extensively in the past  without any clear answers, and patient is continuing to be symptomatic - he was worked up for adrenal insufficiency with a normal ACTH stimulation test  - GI was consulted and have seen the patient last hospitalization, patient underwent an EGD which was unremarkable  - he also had Gastric emptying study which was unremarkable as well  -HIDA scan negative - Continue zofran 4mg  prn - Continue pepcid 20mg  Q12H  -One consideration was that   Amidoarone could cause this symptoms. Dr Cruzita Lederer Discussed with Dr. Acie Fredrickson, and he agrees with holding amiodarone now that patient is 2 months post op. Reccomends low dose BB, will start Metoprolol 12.5 and monitor on telemetry. If his symptoms are amiodarone induced, his orthostasis should resolve when Amio is out of his system (may be few weeks). He is not hypotensive and his BP is in the 130-140s currently, continue to hold Midodrine. Will order morning orthostatics  Uvular swelling/dysphagia - EGD performed 12/02/15 ?whether this caused local irritation - Rapid strep was positive, s/p treatment with single im Penicillin dose - d/w Dr. Erik Obey with ENT on Q000111Q, recommended observation and reflux treatment, he reviewed the CT - patient still complaining of difficulty with swallowing, throat scratching, feels uvula cause obstruction, amd make him cough and vomit.  -I have consulted ENT, Dr Erik Obey  Type II diabetes mellitus - Lantus 15U/Novalog 0-9U - poorly controlled, last A1C 11 - fasting 111 today, continue current regimen   Paroxysmal atrial fibrillation: CHADSVASc = 4 I will hold Xarelto, in case he needs procedure. Hold on Lovenox because he was spitting blood on admission   S/P bioprosthetic AVR, aortic root replacement & CABG (  10/2015)   DVT prophylaxis: None Code Status: Full Family Communication: Wife at bedside. Disposition Plan: TBD  Consultants:   ENT (phone)  Procedures:   None  Antimicrobials:  Penicillin IM 1.2 mil U x  1  Subjective: -  Vomiting is after he cough   Objective: Vitals:   12/07/15 2141 12/08/15 0512 12/08/15 0935 12/08/15 0936  BP: 122/69 117/61 117/67 117/67  Pulse: 84 83 87 87  Resp: 18 18    Temp: 98.6 F (37 C) 98.7 F (37.1 C)    TempSrc: Oral Oral    SpO2: 99%   100%  Weight:      Height:        Intake/Output Summary (Last 24 hours) at 12/08/15 1145 Last data filed at 12/08/15 0516  Gross per 24 hour  Intake              240 ml  Output              300 ml  Net              -60 ml   Filed Weights   12/05/15 2129 12/06/15 0915 12/07/15 1243  Weight: 83.9 kg (185 lb) 78.2 kg (172 lb 6.4 oz) 79.1 kg (174 lb 6.1 oz)    Examination: Constitutional: NAD Vitals:   12/07/15 2141 12/08/15 0512 12/08/15 0935 12/08/15 0936  BP: 122/69 117/61 117/67 117/67  Pulse: 84 83 87 87  Resp: 18 18    Temp: 98.6 F (37 C) 98.7 F (37.1 C)    TempSrc: Oral Oral    SpO2: 99%   100%  Weight:      Height:       General: No acute distress or gross abnormalities. Patient is spitting up saliva in room and has difficulty speaking Eyes:  lids and conjunctivae normal ENMT: Mucous membranes are moist. Uvula is enlarged, flat, and pale. No bloody secretions.  Neck: normal, supple, no masses, no thyromegaly Respiratory: clear to auscultation bilaterally, no wheezing, no crackles. Normal respiratory effort. No accessory muscle use.  Cardiovascular: Regular rate and rhythm. 3/6 SEM, soft click  Abdomen: no tenderness. Bowel sounds positive.   Data Reviewed: I have personally reviewed following labs and imaging studies  CBC:  Recent Labs Lab 12/06/15 0022 12/06/15 0028 12/06/15 0440  WBC 10.9*  --  9.4  NEUTROABS 7.9*  --   --   HGB 10.9* 11.9* 10.4*  HCT 32.8* 35.0* 32.3*  MCV 86.1  --  86.8  PLT 276  --  [id number]   Basic Metabolic Panel:  Recent Labs Lab 12/06/15 0028 12/06/15 0440  NA 136 135  K 4.1 4.4  CL 99* 99*  CO2  --  29  GLUCOSE 174* 180*  BUN 9 7  CREATININE  1.10 1.16  CALCIUM  --  9.3   GFR: Estimated Creatinine Clearance: 66.9 mL/min (by C-G formula based on SCr of 1.16 mg/dL). Liver Function Tests: No results for input(s): AST, ALT, ALKPHOS, BILITOT, PROT, ALBUMIN in the last 168 hours. No results for input(s): LIPASE, AMYLASE in the last 168 hours. No results for input(s): AMMONIA in the last 168 hours. Coagulation Profile: No results for input(s): INR, PROTIME in the last 168 hours. Cardiac Enzymes: No results for input(s): CKTOTAL, CKMB, CKMBINDEX, TROPONINI in the last 168 hours. BNP (last 3 results) No results for input(s): PROBNP in the last 8760 hours. HbA1C: No results for input(s): HGBA1C in the last 72 hours. CBG:  Recent Labs Lab  12/07/15 0748 12/07/15 1205 12/07/15 1742 12/07/15 2139 12/08/15 0808  GLUCAP 111* 123* 190* 140* 103*   Lipid Profile: No results for input(s): CHOL, HDL, LDLCALC, TRIG, CHOLHDL, LDLDIRECT in the last 72 hours. Thyroid Function Tests: No results for input(s): TSH, T4TOTAL, FREET4, T3FREE, THYROIDAB in the last 72 hours. Anemia Panel: No results for input(s): VITAMINB12, FOLATE, FERRITIN, TIBC, IRON, RETICCTPCT in the last 72 hours. Urine analysis:    Component Value Date/Time   COLORURINE YELLOW 11/30/2015 1250   APPEARANCEUR HAZY (A) 11/30/2015 1250   LABSPEC 1.011 11/30/2015 1250   PHURINE 6.5 11/30/2015 1250   GLUCOSEU NEGATIVE 11/30/2015 1250   GLUCOSEU NEGATIVE 04/06/2010 1554   HGBUR TRACE (A) 11/30/2015 1250   BILIRUBINUR NEGATIVE 11/30/2015 1250   BILIRUBINUR Negative 04/03/2014 1642   KETONESUR NEGATIVE 11/30/2015 1250   PROTEINUR 30 (A) 11/30/2015 1250   UROBILINOGEN 1.0 04/03/2014 1642   UROBILINOGEN 1.0 04/03/2014 0058   NITRITE NEGATIVE 11/30/2015 1250   LEUKOCYTESUR LARGE (A) 11/30/2015 1250   Sepsis Labs: Invalid input(s): PROCALCITONIN, LACTICIDVEN  Recent Results (from the past 240 hour(s))  Urine culture     Status: None   Collection Time: 11/30/15 12:50  PM  Result Value Ref Range Status   Specimen Description URINE, RANDOM  Final   Special Requests NONE  Final   Culture NO GROWTH 1 DAY  Final   Report Status 12/02/2015 FINAL  Final  Rapid strep screen (not at [facility])     Status: Abnormal   Collection Time: 12/06/15  9:20 AM  Result Value Ref Range Status   Streptococcus, Group A Screen (Direct) POSITIVE (A) NEGATIVE Final  MRSA PCR Screening     Status: None   Collection Time: 12/06/15 10:23 AM  Result Value Ref Range Status   MRSA by PCR NEGATIVE NEGATIVE Final    Comment:        The GeneXpert MRSA Assay (FDA approved for NASAL specimens only), is one component of a comprehensive MRSA colonization surveillance program. It is not intended to diagnose MRSA infection nor to guide or monitor treatment for MRSA infections.       Radiology Studies: Mr Jeri Cos [id number] Contrast  Result Date: 12/06/2015 CLINICAL DATA:  Intractable nausea and vomiting. Difficulty swallowing and sore throat after recent endoscopy. History of hypertension, hyperlipidemia, and diabetes. EXAM: MRI HEAD WITHOUT AND WITH CONTRAST TECHNIQUE: Multiplanar, multiecho pulse sequences of the brain and surrounding structures were obtained without and with intravenous contrast. CONTRAST:  96mL MULTIHANCE GADOBENATE DIMEGLUMINE 529 MG/ML IV SOLN COMPARISON:  Head CT 11/24/2015 FINDINGS: Brain: There is no evidence of acute infarct, mass, midline shift, or extra-axial fluid collection. Chronic microhemorrhages are present in the left occipital lobe and left temporal lobe. Ventricles and sulci are normal in size. There are numerous small foci of T2 hyperintensity in the subcortical and deep cerebral white matter bilaterally which are mildly to moderately advanced for age. Some of these demonstrate mild T2 shine through on diffusion weighted imaging. The brainstem is normal in appearance. No abnormal enhancement is identified. Vascular: Major intracranial vascular flow voids are  preserved. Skull and upper cervical spine: Unremarkable bone marrow signal. Sinuses/Orbits: Unremarkable orbits. Paranasal sinuses and mastoid air cells are clear. Other: None. IMPRESSION: 1. No acute intracranial abnormality or mass. 2. Mild-to-moderate cerebral white matter T2 signal changes, nonspecific but compatible with chronic small vessel ischemic disease. Electronically Signed   By: Logan Bores M.D.   On: 12/06/2015 19:29   Nm Hepatobiliary Liver Func  Result  Date: 12/07/2015 CLINICAL DATA:  Nausea and vomiting. EXAM: NUCLEAR MEDICINE HEPATOBILIARY IMAGING TECHNIQUE: Sequential images of the abdomen were obtained out to 60 minutes following intravenous administration of radiopharmaceutical. RADIOPHARMACEUTICALS:  5.0 mCi Tc-75m  Choletec IV COMPARISON:  CT scan 12/01/2015 FINDINGS: Symmetric uptake in the liver and prompt excretion into the biliary tree which is visualized by 10 minutes. The gallbladder is visualized at 15 minutes. Small bowel activity is noted at 40 minutes. IMPRESSION: Normal biliary patency study. Electronically Signed   By: Marijo Sanes M.D.   On: 12/07/2015 14:18   Scheduled Meds: . famotidine  20 mg Oral BID  . insulin aspart  0-9 Units Subcutaneous TID WC  . insulin glargine  15 Units Subcutaneous Q2200  . metoprolol tartrate  12.5 mg Oral BID  . rivaroxaban  20 mg Oral Q supper  . sodium chloride flush  3 mL Intravenous Q12H    Niel Hummer, MD [facility] Pager [phone]  If 7PM-7AM, please contact night-coverage [email or web address] Password [email or web address] 12/08/2015, 11:45 AM

## 2015-12-09 ENCOUNTER — Ambulatory Visit: Payer: Self-pay | Admitting: Cardiothoracic Surgery

## 2015-12-09 DIAGNOSIS — Z953 Presence of xenogenic heart valve: Secondary | ICD-10-CM | POA: Diagnosis not present

## 2015-12-09 DIAGNOSIS — Z79899 Other long term (current) drug therapy: Secondary | ICD-10-CM | POA: Diagnosis not present

## 2015-12-09 DIAGNOSIS — E1165 Type 2 diabetes mellitus with hyperglycemia: Secondary | ICD-10-CM | POA: Diagnosis present

## 2015-12-09 DIAGNOSIS — R1310 Dysphagia, unspecified: Secondary | ICD-10-CM | POA: Diagnosis present

## 2015-12-09 DIAGNOSIS — I2510 Atherosclerotic heart disease of native coronary artery without angina pectoris: Secondary | ICD-10-CM | POA: Diagnosis present

## 2015-12-09 DIAGNOSIS — Z7901 Long term (current) use of anticoagulants: Secondary | ICD-10-CM | POA: Diagnosis not present

## 2015-12-09 DIAGNOSIS — I951 Orthostatic hypotension: Secondary | ICD-10-CM | POA: Diagnosis present

## 2015-12-09 DIAGNOSIS — Z95828 Presence of other vascular implants and grafts: Secondary | ICD-10-CM | POA: Diagnosis not present

## 2015-12-09 DIAGNOSIS — I480 Paroxysmal atrial fibrillation: Secondary | ICD-10-CM | POA: Diagnosis present

## 2015-12-09 DIAGNOSIS — L980 Pyogenic granuloma: Secondary | ICD-10-CM | POA: Diagnosis present

## 2015-12-09 DIAGNOSIS — K122 Cellulitis and abscess of mouth: Secondary | ICD-10-CM | POA: Diagnosis present

## 2015-12-09 DIAGNOSIS — Z951 Presence of aortocoronary bypass graft: Secondary | ICD-10-CM | POA: Diagnosis not present

## 2015-12-09 DIAGNOSIS — E1149 Type 2 diabetes mellitus with other diabetic neurological complication: Secondary | ICD-10-CM | POA: Diagnosis present

## 2015-12-09 DIAGNOSIS — J020 Streptococcal pharyngitis: Secondary | ICD-10-CM | POA: Diagnosis present

## 2015-12-09 DIAGNOSIS — E785 Hyperlipidemia, unspecified: Secondary | ICD-10-CM | POA: Diagnosis present

## 2015-12-09 DIAGNOSIS — K115 Sialolithiasis: Secondary | ICD-10-CM | POA: Diagnosis present

## 2015-12-09 DIAGNOSIS — K219 Gastro-esophageal reflux disease without esophagitis: Secondary | ICD-10-CM | POA: Diagnosis present

## 2015-12-09 DIAGNOSIS — I10 Essential (primary) hypertension: Secondary | ICD-10-CM | POA: Diagnosis present

## 2015-12-09 DIAGNOSIS — Z794 Long term (current) use of insulin: Secondary | ICD-10-CM | POA: Diagnosis not present

## 2015-12-09 DIAGNOSIS — K1379 Other lesions of oral mucosa: Secondary | ICD-10-CM | POA: Diagnosis not present

## 2015-12-09 LAB — CBC
HCT: 32.5 % — ABNORMAL LOW (ref 39.0–52.0)
Hemoglobin: 10.6 g/dL — ABNORMAL LOW (ref 13.0–17.0)
MCH: 28.3 pg (ref 26.0–34.0)
MCHC: 32.6 g/dL (ref 30.0–36.0)
MCV: 86.7 fL (ref 78.0–100.0)
PLATELETS: 306 10*3/uL (ref 150–400)
RBC: 3.75 MIL/uL — ABNORMAL LOW (ref 4.22–5.81)
RDW: 14.9 % (ref 11.5–15.5)
WBC: 7.2 10*3/uL (ref 4.0–10.5)

## 2015-12-09 LAB — GLUCOSE, CAPILLARY
GLUCOSE-CAPILLARY: 148 mg/dL — AB (ref 65–99)
Glucose-Capillary: 118 mg/dL — ABNORMAL HIGH (ref 65–99)
Glucose-Capillary: 110 mg/dL — ABNORMAL HIGH (ref 65–99)
Glucose-Capillary: 212 mg/dL — ABNORMAL HIGH (ref 65–99)

## 2015-12-09 LAB — BASIC METABOLIC PANEL
ANION GAP: 9 (ref 5–15)
BUN: <5 mg/dL — ABNORMAL LOW (ref 6–20)
CALCIUM: 9.4 mg/dL (ref 8.9–10.3)
CO2: 29 mmol/L (ref 22–32)
Chloride: 98 mmol/L — ABNORMAL LOW (ref 101–111)
Creatinine, Ser: 0.98 mg/dL (ref 0.61–1.24)
GLUCOSE: 113 mg/dL — AB (ref 65–99)
Potassium: 3.5 mmol/L (ref 3.5–5.1)
Sodium: 136 mmol/L (ref 135–145)

## 2015-12-09 LAB — HEPARIN LEVEL (UNFRACTIONATED): Heparin Unfractionated: 1.03 IU/mL — ABNORMAL HIGH (ref 0.30–0.70)

## 2015-12-09 LAB — APTT
APTT: 117 s — AB (ref 24–36)
aPTT: 37 seconds — ABNORMAL HIGH (ref 24–36)

## 2015-12-09 MED ORDER — HEPARIN (PORCINE) IN NACL 100-0.45 UNIT/ML-% IJ SOLN
950.0000 [IU]/h | INTRAMUSCULAR | Status: DC
  Administered 2015-12-09: 1100 [IU]/h via INTRAVENOUS
  Filled 2015-12-09: qty 250

## 2015-12-09 MED ORDER — PHENOL 1.4 % MT LIQD
1.0000 | OROMUCOSAL | Status: DC | PRN
  Filled 2015-12-09: qty 177

## 2015-12-09 MED ORDER — ONDANSETRON HCL 4 MG/2ML IJ SOLN
4.0000 mg | Freq: Four times a day (QID) | INTRAMUSCULAR | Status: DC | PRN
  Administered 2015-12-10: 2 mg via INTRAVENOUS

## 2015-12-09 NOTE — Progress Notes (Signed)
ANTICOAGULATION CONSULT NOTE - Initial Consult  Pharmacy Consult for heparin  Indication: atrial fibrillation  Allergies  Allergen Reactions  . No Known Allergies     Patient Measurements: Height: 6' (182.9 cm) Weight: 174 lb 6.1 oz (79.1 kg) IBW/kg (Calculated) : 77.6 Heparin Dosing Weight: 79.1 kg  Vital Signs: Temp: 98.6 F (37 C) (10/26 0845) Temp Source: Oral (10/26 0845) BP: 131/62 (10/26 1105) Pulse Rate: 82 (10/26 1105)  Labs:  Recent Labs  12/08/15 1509 12/09/15 0428  HGB 10.6* 10.6*  HCT 32.4* 32.5*  PLT 304 306  CREATININE 1.03 0.98    Estimated Creatinine Clearance: 79.2 mL/min (by C-G formula based on SCr of 0.98 mg/dL).   Medical History: Past Medical History:  Diagnosis Date  . 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 Carpal tunnel syndrome 09/24/2014   Bilateral  . 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 Diabetes mellitus, type 2 ([place])   . Erectile dysfunction   . Essential hypertension   . Hyperlipidemia     Medications:  Prescriptions Prior to Admission  Medication Sig Dispense Refill Last Dose  . amiodarone (PACERONE) 200 MG tablet Take 1 tablet (200 mg total) by mouth daily. 60 tablet 1 12/05/2015 at am  . atorvastatin (LIPITOR) 40 MG tablet Take 1 tablet (40 mg total) by mouth daily. 30 tablet 11 12/04/2015 at pm  . B-D ULTRAFINE III SHORT PEN 31G X 8 MM MISC USE 4 TIMES DAILY AS DIRECTED 100 each 0   . ferrous gluconate (FERGON) 324 MG tablet Take 1 tablet (324 mg total) by mouth 2 (two) times daily with a meal. 60 tablet 1 [id number] at am  . folic acid (FOLVITE) 1 MG tablet Take 1 tablet (1 mg total) by mouth daily. 30 tablet 1 12/05/2015 at am  . glucose blood test strip Use as instructed 100 each 12 11/23/2015 at Unknown time  . Insulin Glargine (LANTUS SOLOSTAR) 100 UNIT/ML Solostar Pen Inject 20 Units into the skin daily at 10 pm. (Patient taking differently: Inject 20  Units into the skin at bedtime. ) 5 pen 5 12/04/2015 at pm  . insulin lispro (HUMALOG KWIKPEN) 100 UNIT/ML KiwkPen Use 5 to 10 units three times daily 10-15 minutes before meals (Patient taking differently: Inject 5-10 Units into the skin 3 (three) times daily before meals. ) 15 mL 5 12/05/2015 at am  . metFORMIN (GLUCOPHAGE) 1000 MG tablet Take 1 tablet (1,000 mg total) by mouth 2 (two) times daily with a meal. 180 tablet 1 12/05/2015 at am  . midodrine (PROAMATINE) 10 MG tablet Take 1 tablet (10 mg total) by mouth 3 (three) times daily with meals. 90 tablet 0 12/05/2015 at am  . ondansetron (ZOFRAN ODT) 4 MG disintegrating tablet Take 1 tablet (4 mg total) by mouth every 8 (eight) hours as needed for nausea or vomiting. 30 tablet 0 12/05/2015 at am  . pantoprazole (PROTONIX) 40 MG tablet Take 1 tablet (40 mg total) by mouth daily at 12 noon. 30 tablet 0 12/05/2015 at am  . rivaroxaban (XARELTO) 20 MG TABS tablet Take 1 tablet (20 mg total) by mouth daily with supper. 30 tablet 0 12/04/2015 at pm  . [EXPIRED] saccharomyces boulardii (FLORASTOR) 250 MG capsule Take 1 capsule (250 mg total) by mouth 2 (two) times daily. 20 capsule 0 12/05/2015 at 1200  . famotidine (PEPCID AC) 10 MG chewable tablet Chew 1 tablet (10 mg total) by mouth  2 (two) times daily as needed for heartburn. (Patient not taking: Reported on 12/05/2015)   Not Taking at Unknown time    Assessment: 70 yo M to start heparin for bridge therapy while xarelto on hold for a procedure.  Xarelto home dose 20 mg qsupper, last dose PTA 10/21.  Xarelto 20 mg was ordered in hospital Tues 10/24 but not charted as given - both pt and his wife say he was given a dose of xarelto on Tues 10/24. RN says she remembers giving it and will chart it today..  Xarelto may affect heparin levels.  CBC stable, no bleeding reported.  To have uvular tip amputated tomorrow by ENT at 0730 am and heparin to be held 4 hrs prior to procedure.  Goal of Therapy:   Heparin level 0.3-0.7 units/ml aPTT 66-102 seconds Monitor platelets by anticoagulation protocol: Yes   Plan:  Draw baseline aPTT and heparin level prior to starting heparin drip to assess xarelto effect Start heparin with no bolus at 1100 units/hr after baseline labs are drawn and draw 8 hr aPTT/HL Heparin off at 0330 am to be off 4 hrs prior to ENT procedure - d/w Dr Tyrell Antonio and pt and wife  Eudelia Bunch, Pharm.D. BP:[id number] 12/09/2015 12:30 PM

## 2015-12-09 NOTE — Progress Notes (Signed)
PROGRESS NOTE  [name] [id number] DOB: 02/28/1945 DOA: 12/05/2015 PCP: [name], NP   LOS: 1 day   Brief Narrative: Patient is a 70 y.o. male with recent history of 4 vessel CABG and bioprosthetic aortic valve replacement in September 2015, following which he has had 2 hospitalizations for vomiting, orthostatic hypotension and UTI-admitted again on 10/17 for recurrent vomiting. He underwent further evaluation with EGD on 12/02/15 without findings to account for vomiting. CT had no significant findings. Patient discharged on 12/03/15. He presented to the ED on 12/05/15 for sore throat that has gotten progressively worse over 3 days. Patient has had dysphagia and odynophagia since the EGD procedure. He reports spitting up blood and vomited 10 times yesterday. Denies fever, chills, dyspnea, chest pain, and cough. Vomiting occurs when he talks for long periods of time. Today he was able to eat broth and drink fluids. CT of soft tissue of neck on 12/06/15 shows apposed vocal cords, most compatible with phonation though, there could be a component of laryngitis, 6 x 12 mm RIGHT sublingual gland sialolith without sialoadenitis, and persistent LEFT lung atelectasis, with pleural effusion.    Assessment & Plan: Principal Problem:   Uvular swelling Active Problems:   Type II diabetes mellitus with neurological manifestations, uncontrolled ([place])   Hyperlipidemia   Aortic stenosis s/p tissue valve (Sept 2017)   Hx of CABG (Sept 2017)   PAF (paroxysmal atrial fibrillation) ([facility])   Coronary artery disease involving coronary bypass graft of native heart without angina pectoris   Sore throat   Nausea & vomiting   Difficulty swallowing  Nausea/vomiting and orthostatic hypotension - Patient has been having intractable nausea and vomiting and orthostatic hypotension ever since he had his CABG couple of months ago. He was feeling fine prior to that. He was worked up extensively in the past  without any clear answers, and patient is continuing to be symptomatic - he was worked up for adrenal insufficiency with a normal ACTH stimulation test  - GI was consulted and have seen the patient last hospitalization, patient underwent an EGD which was unremarkable  - he also had Gastric emptying study which was unremarkable as well  -HIDA scan negative - Continue zofran 4mg  prn - Continue pepcid 20mg  Q12H -One consideration was that   Amidoarone could cause this symptoms. Dr Cruzita Lederer Discussed with Dr. Acie Fredrickson, and he agrees with holding amiodarone now that patient is 2 months post op. Reccomends low dose BB, will start Metoprolol 12.5 and monitor on telemetry. If his symptoms are amiodarone induced, his orthostasis should resolve when Amio is out of his system (may be few weeks). He is not hypotensive and his BP is in the 130-140s currently, continue to hold Midodrine.   Uvular swelling/dysphagia - EGD performed 12/02/15 ?whether this caused local irritation - Rapid strep was positive, s/p treatment with single im Penicillin dose - d/w Dr. Erik Obey with ENT on [id number], recommended observation and reflux treatment, he reviewed the CT - patient still complaining of difficulty with swallowing, throat scratching, feels uvula cause obstruction, amd make him cough and vomit.  -Appreciate  Dr Erik Obey help. Plan for amputation of uvula tip.   Type II diabetes mellitus - Lantus 15U/Novalog 0-9U - poorly controlled, last A1C 11 - fasting 111 today, continue current regimen   Paroxysmal atrial fibrillation: CHADSVASc = 4 I will hold Xarelto, in case he needs procedure.  Start heparin per pharmacy to dose. Stop tomorrow 4 hours prior to procedure.   S/P bioprosthetic  AVR, aortic root replacement & CABG (10/2015)   DVT prophylaxis: None Code Status: Full Family Communication: Wife at bedside. Disposition Plan: TBD  Consultants:   ENT (phone)  Procedures:    None  Antimicrobials:  Penicillin IM 1.2 mil U x 1  Subjective: He is tolerating clears.  Feeling ok.  Agree to have uvula tip removed/  Objective: Vitals:   12/08/15 2150 12/09/15 0515 12/09/15 0845 12/09/15 1105  BP:  135/66 113/77 131/62  Pulse:  76 (!) 103 82  Resp: 17 17 16    Temp: 98.4 F (36.9 C) 98.5 F (36.9 C) 98.6 F (37 C)   TempSrc: Oral  Oral   SpO2: 98% 100% 100%   Weight:      Height:        Intake/Output Summary (Last 24 hours) at 12/09/15 1218 Last data filed at 12/09/15 1044  Gross per 24 hour  Intake                0 ml  Output              525 ml  Net             -525 ml   Filed Weights   12/05/15 2129 12/06/15 0915 12/07/15 1243  Weight: 83.9 kg (185 lb) 78.2 kg (172 lb 6.4 oz) 79.1 kg (174 lb 6.1 oz)    Examination: Constitutional: NAD Vitals:   12/08/15 2150 12/09/15 0515 12/09/15 0845 12/09/15 1105  BP:  135/66 113/77 131/62  Pulse:  76 (!) 103 82  Resp: 17 17 16    Temp: 98.4 F (36.9 C) 98.5 F (36.9 C) 98.6 F (37 C)   TempSrc: Oral  Oral   SpO2: 98% 100% 100%   Weight:      Height:       General: No acute distress or gross abnormalities. Patient is spitting up saliva in room and has difficulty speaking Eyes:  lids and conjunctivae normal ENMT: Mucous membranes are moist. Uvula is enlarged, flat, and pale. No bloody secretions.  Neck: normal, supple, no masses, no thyromegaly Respiratory: clear to auscultation bilaterally, no wheezing, no crackles. Normal respiratory effort. No accessory muscle use.  Cardiovascular: Regular rate and rhythm. 3/6 SEM, soft click  Abdomen: no tenderness. Bowel sounds positive.   Data Reviewed: I have personally reviewed following labs and imaging studies  CBC:  Recent Labs Lab 12/06/15 0022 12/06/15 0028 12/06/15 0440 12/08/15 1509 12/09/15 0428  WBC 10.9*  --  9.4 8.0 7.2  NEUTROABS 7.9*  --   --   --   --   HGB 10.9* 11.9* 10.4* 10.6* 10.6*  HCT 32.8* 35.0* 32.3* 32.4* 32.5*   MCV 86.1  --  86.8 86.9 86.7  PLT 276  --  287 304 [id number]   Basic Metabolic Panel:  Recent Labs Lab 12/06/15 0028 12/06/15 0440 12/08/15 1509 12/09/15 0428  NA 136 135 137 136  K 4.1 4.4 3.6 3.5  CL 99* 99* 99* 98*  CO2  --  29 29 29   GLUCOSE 174* 180* 144* 113*  BUN 9 7 6  <5*  CREATININE 1.10 1.16 1.03 0.98  CALCIUM  --  9.3 9.7 9.4   GFR: Estimated Creatinine Clearance: 79.2 mL/min (by C-G formula based on SCr of 0.98 mg/dL). Liver Function Tests: No results for input(s): AST, ALT, ALKPHOS, BILITOT, PROT, ALBUMIN in the last 168 hours. No results for input(s): LIPASE, AMYLASE in the last 168 hours. No results for input(s): AMMONIA in the  last 168 hours. Coagulation Profile: No results for input(s): INR, PROTIME in the last 168 hours. Cardiac Enzymes: No results for input(s): CKTOTAL, CKMB, CKMBINDEX, TROPONINI in the last 168 hours. BNP (last 3 results) No results for input(s): PROBNP in the last 8760 hours. HbA1C: No results for input(s): HGBA1C in the last 72 hours. CBG:  Recent Labs Lab 12/08/15 0808 12/08/15 1201 12/08/15 1609 12/08/15 2147 12/09/15 0808  GLUCAP 103* 201* 115* 129* 118*   Lipid Profile: No results for input(s): CHOL, HDL, LDLCALC, TRIG, CHOLHDL, LDLDIRECT in the last 72 hours. Thyroid Function Tests: No results for input(s): TSH, T4TOTAL, FREET4, T3FREE, THYROIDAB in the last 72 hours. Anemia Panel: No results for input(s): VITAMINB12, FOLATE, FERRITIN, TIBC, IRON, RETICCTPCT in the last 72 hours. Urine analysis:    Component Value Date/Time   COLORURINE YELLOW 11/30/2015 1250   APPEARANCEUR HAZY (A) 11/30/2015 1250   LABSPEC 1.011 11/30/2015 1250   PHURINE 6.5 11/30/2015 1250   GLUCOSEU NEGATIVE 11/30/2015 1250   GLUCOSEU NEGATIVE 04/06/2010 1554   HGBUR TRACE (A) 11/30/2015 1250   BILIRUBINUR NEGATIVE 11/30/2015 1250   BILIRUBINUR Negative 04/03/2014 1642   KETONESUR NEGATIVE 11/30/2015 1250   PROTEINUR 30 (A) 11/30/2015 1250    UROBILINOGEN 1.0 04/03/2014 1642   UROBILINOGEN 1.0 04/03/2014 0058   NITRITE NEGATIVE 11/30/2015 1250   LEUKOCYTESUR LARGE (A) 11/30/2015 1250   Sepsis Labs: Invalid input(s): PROCALCITONIN, LACTICIDVEN  Recent Results (from the past 240 hour(s))  Urine culture     Status: None   Collection Time: 11/30/15 12:50 PM  Result Value Ref Range Status   Specimen Description URINE, RANDOM  Final   Special Requests NONE  Final   Culture NO GROWTH 1 DAY  Final   Report Status 12/02/2015 FINAL  Final  Rapid strep screen (not at [facility])     Status: Abnormal   Collection Time: 12/06/15  9:20 AM  Result Value Ref Range Status   Streptococcus, Group A Screen (Direct) POSITIVE (A) NEGATIVE Final  MRSA PCR Screening     Status: None   Collection Time: 12/06/15 10:23 AM  Result Value Ref Range Status   MRSA by PCR NEGATIVE NEGATIVE Final    Comment:        The GeneXpert MRSA Assay (FDA approved for NASAL specimens only), is one component of a comprehensive MRSA colonization surveillance program. It is not intended to diagnose MRSA infection nor to guide or monitor treatment for MRSA infections.       Radiology Studies: No results found. Scheduled Meds: . famotidine  20 mg Oral BID  . insulin aspart  0-9 Units Subcutaneous TID WC  . insulin glargine  15 Units Subcutaneous Q2200  . lidocaine  15 mL Mouth/Throat Once  . metoprolol tartrate  12.5 mg Oral BID  . sodium chloride flush  3 mL Intravenous Q12H    Niel Hummer, MD [facility] Pager [phone]  If 7PM-7AM, please contact night-coverage [email or web address] Password [email or web address] 12/09/2015, 12:18 PM

## 2015-12-09 NOTE — Evaluation (Signed)
Physical Therapy Evaluation Patient Details Name: [name] MRN: [id number] DOB: 06/07/1945 Today's Date: 12/09/2015   History of Present Illness  Pt is a 70 y/o male admitted secondary to difficulty swallowing with uvular swelling as well as nausea and vomitting. PMH including but not limited to recent CABG (10/2015), DM and A-fib.  Clinical Impression  Pt presented supine in bed with HOB elevated, awake and willing to participate in therapy session. Prior to admission, pt reported ambulating with use of RW and participating in [facility] PT due to recent CABG (10/2015). Pt moving well during session with no LOB or issues with dizziness. Ending BP in sitting, after ambulation was 131/74. Pt would continue to benefit from skilled physical therapy services at this time while admitted and after d/c to address his below listed limitations in order to improve his overall safety and independence with functional mobility.     Follow Up Recommendations Home health PT;Supervision for mobility/OOB    Equipment Recommendations  None recommended by PT    Recommendations for Other Services       Precautions / Restrictions Precautions Precautions: Fall Precaution Comments: monitor HR and BP Restrictions Weight Bearing Restrictions: No      Mobility  Bed Mobility Overal bed mobility: Needs Assistance Bed Mobility: Supine to Sit     Supine to sit: Min guard;HOB elevated     General bed mobility comments: pt required increased time, HOB elevated, min guard for safety  Transfers Overall transfer level: Needs assistance Equipment used: Rolling walker (2 wheeled) Transfers: Sit to/from Stand Sit to Stand: Supervision         General transfer comment: pt required increased time to complete task. Despite VC'ing for bilateral hand placement, pt pulled up on RW with bilateral UEs  Ambulation/Gait Ambulation/Gait assistance: Min guard Ambulation Distance (Feet): 300 Feet Assistive device:  Rolling walker (2 wheeled) Gait Pattern/deviations: Step-through pattern;Decreased stride length Gait velocity: decreased Gait velocity interpretation: Below normal speed for age/gender General Gait Details: pt with no reports of dizziness throughout ambulation. min guard for safety. no LOB during ambulation.  Stairs            Wheelchair Mobility    Modified Rankin (Stroke Patients Only)       Balance Overall balance assessment: Needs assistance Sitting-balance support: Feet supported;No upper extremity supported Sitting balance-Leahy Scale: Good     Standing balance support: During functional activity;No upper extremity supported Standing balance-Leahy Scale: Fair                               Pertinent Vitals/Pain Pain Assessment: No/denies pain    Home Living Family/patient expects to be discharged to:: Private residence Living Arrangements: Spouse/significant other;Children Available Help at Discharge: Family;Available 24 hours/day Type of Home: House Home Access: Level entry     Home Layout: Two level;Bed/bath upstairs;1/2 bath on main level Home Equipment: Walker - 2 wheels;Bedside commode      Prior Function Level of Independence: Independent with assistive device(s)         Comments: Amb with RW since CABG 9/15. Prior to that was I with ambulation.     Hand Dominance        Extremity/Trunk Assessment   Upper Extremity Assessment: Overall WFL for tasks assessed           Lower Extremity Assessment: Overall WFL for tasks assessed      Cervical / Trunk Assessment: Kyphotic  Communication  Communication: No difficulties  Cognition Arousal/Alertness: Awake/alert Behavior During Therapy: WFL for tasks assessed/performed Overall Cognitive Status: Within Functional Limits for tasks assessed                      General Comments      Exercises     Assessment/Plan    PT Assessment Patient needs continued PT  services  PT Problem List Decreased activity tolerance;Decreased balance;Decreased mobility;Decreased coordination          PT Treatment Interventions DME instruction;Gait training;Stair training;Functional mobility training;Therapeutic activities;Therapeutic exercise;Balance training;Neuromuscular re-education;Patient/family education    PT Goals (Current goals can be found in the Care Plan section)  Acute Rehab PT Goals Patient Stated Goal: return home PT Goal Formulation: With patient/family Time For Goal Achievement: 12/23/15 Potential to Achieve Goals: Good    Frequency Min 3X/week   Barriers to discharge        Co-evaluation               End of Session Equipment Utilized During Treatment: Gait belt Activity Tolerance: Patient tolerated treatment well Patient left: in chair;with call bell/phone within reach;with family/visitor present Nurse Communication: Mobility status    Functional Assessment Tool Used: clinical judgement Functional Limitation: Mobility: Walking and moving around Mobility: Walking and Moving Around Current Status [phone]): At least 1 percent but less than 20 percent impaired, limited or restricted Mobility: Walking and Moving Around Goal Status [phone]): 0 percent impaired, limited or restricted    Time: 1720-1747 PT Time Calculation (min) (ACUTE ONLY): 27 min   Charges:   PT Evaluation $PT Eval Moderate Complexity: 1 Procedure PT Treatments $Gait Training: 8-22 mins   PT G Codes:   PT G-Codes **NOT FOR INPATIENT CLASS** Functional Assessment Tool Used: clinical judgement Functional Limitation: Mobility: Walking and moving around Mobility: Walking and Moving Around Current Status [id number]): At least 1 percent but less than 20 percent impaired, limited or restricted Mobility: Walking and Moving Around Goal Status [phone]): 0 percent impaired, limited or restricted    [facility] 12/09/2015, 5:51 PM Sherie Don, ,  DPT [phone]

## 2015-12-09 NOTE — Care Management Obs Status (Signed)
[place] NOTIFICATION   Patient Details  Name: [name] MRN: [id number] Date of Birth: [birth year]   Medicare Observation Status Notification Given:  Yes    Marilu Favre, RN 12/09/2015, 9:58 AM

## 2015-12-09 NOTE — Consult Note (Signed)
[name], [name] 70 y.o., male [id number]     Chief Complaint: throat pain and cough/vomiting  HPI: 70 yo bm underwent CABG and aortic valve replacement SEP 2017.  Post op had N,V with abdominal discomfort.  Evaluation thus far negative.  Underwent UGI endoscopy 7 days ago.  Subsequently has had throat pain, bleeding, coughing/choking, and vomiting different from his abdominal symptoms.  Wife witnessed whitish appearance to uvula and also swelling, with uvula lying on dorsum of tongue at one point.  No smoking.  No hx reflux.  No prior similar occurrence.  No breathing difficulty. Difficult and painful swallowing. Has lost 30 lbs since heart surgery.  PMH: Past Medical History:  Diagnosis Date  . 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 Carpal tunnel syndrome 09/24/2014   Bilateral  . 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 Diabetes mellitus, type 2 ([place])   . Erectile dysfunction   . Essential hypertension   . Hyperlipidemia     Surg Hx: Past Surgical History:  Procedure Laterality Date  . AORTIC VALVE REPLACEMENT N/A 10/29/2015   Procedure: AORTIC VALVE REPLACEMENT (AVR) WITH 23MM MAGNA EASE TISSUE VALVE.;  Surgeon: [name], MD;  Location: [place];  Service: Open Heart Surgery;  Laterality: N/A;  . CARDIAC CATHETERIZATION N/A 10/27/2015   Procedure: Left Heart Cath and Coronary Angiography;  Surgeon: [name], MD;  Location: [facility];  Service: Cardiovascular;  Laterality: N/A;  . CORONARY ARTERY BYPASS GRAFT N/A 10/29/2015   Procedure: CORONARY ARTERY BYPASS GRAFTING (CABG) x Four UTILIZING THE LEFT INTERNAL MAMMARY ARTERY AND ENDOSCOPICALLY HARVESTED RIGHT SAPEHENEOUS VEINS.;  Surgeon: [name], MD;  Location: [place];  Service: Open Heart Surgery;  Laterality: N/A;  . CYST REMOVAL HAND Right   . ESOPHAGOGASTRODUODENOSCOPY N/A 12/02/2015   Procedure: ESOPHAGOGASTRODUODENOSCOPY (EGD);  Surgeon:  [name], MD;  Location: [place];  Service: Gastroenterology;  Laterality: N/A;  . REPLACEMENT ASCENDING AORTA N/A 10/29/2015   Procedure: REPLACEMENT OF ASCENDING AORTA USING 34MM X 30CM WOVEN DOUBLE VELOUR VASCULAR GRAFT.;  Surgeon: [name], MD;  Location: [place];  Service: Open Heart Surgery;  Laterality: N/A;  . TEE WITHOUT CARDIOVERSION N/A 10/29/2015   Procedure: TRANSESOPHAGEAL ECHOCARDIOGRAM (TEE);  Surgeon: [name], MD;  Location: [place];  Service: Open Heart Surgery;  Laterality: N/A;    FHx:   Family History  Problem Relation Age of Onset  . Cancer Father     Throat cancer  . Diabetes Sister    SocHx:  reports that he has never smoked. He has never used smokeless tobacco. He reports that he does not drink alcohol or use drugs.  ALLERGIES:  Allergies  Allergen Reactions  . No Known Allergies     Medications Prior to Admission  Medication Sig Dispense Refill  . amiodarone (PACERONE) 200 MG tablet Take 1 tablet (200 mg total) by mouth daily. 60 tablet 1  . atorvastatin (LIPITOR) 40 MG tablet Take 1 tablet (40 mg total) by mouth daily. 30 tablet 11  . B-D ULTRAFINE III SHORT PEN 31G X 8 MM MISC USE 4 TIMES DAILY AS DIRECTED 100 each 0  . ferrous gluconate (FERGON) 324 MG tablet Take 1 tablet (324 mg total) by mouth 2 (two) times daily with a meal. 60 tablet 1  . folic acid (FOLVITE) 1 MG tablet Take 1 tablet (1 mg total) by mouth daily. 30 tablet  1  . glucose blood test strip Use as instructed 100 each 12  . Insulin Glargine (LANTUS SOLOSTAR) 100 UNIT/ML Solostar Pen Inject 20 Units into the skin daily at 10 pm. (Patient taking differently: Inject 20 Units into the skin at bedtime. ) 5 pen 5  . insulin lispro (HUMALOG KWIKPEN) 100 UNIT/ML KiwkPen Use 5 to 10 units three times daily 10-15 minutes before meals (Patient taking differently: Inject 5-10 Units into the skin 3 (three) times daily before meals. ) 15 mL 5  . metFORMIN (GLUCOPHAGE) 1000  MG tablet Take 1 tablet (1,000 mg total) by mouth 2 (two) times daily with a meal. 180 tablet 1  . midodrine (PROAMATINE) 10 MG tablet Take 1 tablet (10 mg total) by mouth 3 (three) times daily with meals. 90 tablet 0  . ondansetron (ZOFRAN ODT) 4 MG disintegrating tablet Take 1 tablet (4 mg total) by mouth every 8 (eight) hours as needed for nausea or vomiting. 30 tablet 0  . pantoprazole (PROTONIX) 40 MG tablet Take 1 tablet (40 mg total) by mouth daily at 12 noon. 30 tablet 0  . rivaroxaban (XARELTO) 20 MG TABS tablet Take 1 tablet (20 mg total) by mouth daily with supper. 30 tablet 0  . [EXPIRED] saccharomyces boulardii (FLORASTOR) 250 MG capsule Take 1 capsule (250 mg total) by mouth 2 (two) times daily. 20 capsule 0  . famotidine (PEPCID AC) 10 MG chewable tablet Chew 1 tablet (10 mg total) by mouth 2 (two) times daily as needed for heartburn. (Patient not taking: Reported on 12/05/2015)      Results for orders placed or performed during the hospital encounter of 12/05/15 (from the past 48 hour(s))  Glucose, capillary     Status: Abnormal   Collection Time: 12/07/15 12:05 PM  Result Value Ref Range   Glucose-Capillary 123 (H) 65 - 99 mg/dL  Glucose, capillary     Status: Abnormal   Collection Time: 12/07/15  5:42 PM  Result Value Ref Range   Glucose-Capillary 190 (H) 65 - 99 mg/dL   Comment 1 Notify RN   Glucose, capillary     Status: Abnormal   Collection Time: 12/07/15  9:39 PM  Result Value Ref Range   Glucose-Capillary 140 (H) 65 - 99 mg/dL  Glucose, capillary     Status: Abnormal   Collection Time: 12/08/15  8:08 AM  Result Value Ref Range   Glucose-Capillary 103 (H) 65 - 99 mg/dL  Glucose, capillary     Status: Abnormal   Collection Time: 12/08/15 12:01 PM  Result Value Ref Range   Glucose-Capillary 201 (H) 65 - 99 mg/dL  CBC     Status: Abnormal   Collection Time: 12/08/15  3:09 PM  Result Value Ref Range   WBC 8.0 4.0 - 10.5 K/uL   RBC 3.73 (L) 4.22 - 5.81 MIL/uL    Hemoglobin 10.6 (L) 13.0 - 17.0 g/dL   HCT 32.4 (L) 39.0 - 52.0 %   MCV 86.9 78.0 - 100.0 fL   MCH 28.4 26.0 - 34.0 pg   MCHC 32.7 30.0 - 36.0 g/dL   RDW 15.0 11.5 - 15.5 %   Platelets 304 150 - 400 K/uL  Basic metabolic panel     Status: Abnormal   Collection Time: 12/08/15  3:09 PM  Result Value Ref Range   Sodium 137 135 - 145 mmol/L   Potassium 3.6 3.5 - 5.1 mmol/L   Chloride 99 (L) 101 - 111 mmol/L   CO2 29 22 -  32 mmol/L   Glucose, Bld 144 (H) 65 - 99 mg/dL   BUN 6 6 - 20 mg/dL   Creatinine, Ser 1.03 0.61 - 1.24 mg/dL   Calcium 9.7 8.9 - 10.3 mg/dL   GFR calc non Af Amer >60 >60 mL/min   GFR calc Af Amer >60 >60 mL/min    Comment: (NOTE) The eGFR has been calculated using the CKD EPI equation. This calculation has not been validated in all clinical situations. eGFR's persistently <60 mL/min signify possible Chronic Kidney Disease.    Anion gap 9 5 - 15  Glucose, capillary     Status: Abnormal   Collection Time: 12/08/15  4:09 PM  Result Value Ref Range   Glucose-Capillary 115 (H) 65 - 99 mg/dL  Glucose, capillary     Status: Abnormal   Collection Time: 12/08/15  9:47 PM  Result Value Ref Range   Glucose-Capillary 129 (H) 65 - 99 mg/dL  CBC     Status: Abnormal   Collection Time: 12/09/15  4:28 AM  Result Value Ref Range   WBC 7.2 4.0 - 10.5 K/uL   RBC 3.75 (L) 4.22 - 5.81 MIL/uL   Hemoglobin 10.6 (L) 13.0 - 17.0 g/dL   HCT 32.5 (L) 39.0 - 52.0 %   MCV 86.7 78.0 - 100.0 fL   MCH 28.3 26.0 - 34.0 pg   MCHC 32.6 30.0 - 36.0 g/dL   RDW 14.9 11.5 - 15.5 %   Platelets 306 150 - 400 K/uL  Basic metabolic panel     Status: Abnormal   Collection Time: 12/09/15  4:28 AM  Result Value Ref Range   Sodium 136 135 - 145 mmol/L   Potassium 3.5 3.5 - 5.1 mmol/L   Chloride 98 (L) 101 - 111 mmol/L   CO2 29 22 - 32 mmol/L   Glucose, Bld 113 (H) 65 - 99 mg/dL   BUN <5 (L) 6 - 20 mg/dL   Creatinine, Ser 0.98 0.61 - 1.24 mg/dL   Calcium 9.4 8.9 - 10.3 mg/dL   GFR calc non Af  Amer >60 >60 mL/min   GFR calc Af Amer >60 >60 mL/min    Comment: (NOTE) The eGFR has been calculated using the CKD EPI equation. This calculation has not been validated in all clinical situations. eGFR's persistently <60 mL/min signify possible Chronic Kidney Disease.    Anion gap 9 5 - 15  Glucose, capillary     Status: Abnormal   Collection Time: 12/09/15  8:08 AM  Result Value Ref Range   Glucose-Capillary 118 (H) 65 - 99 mg/dL   Nm Hepatobiliary Liver Func  Result Date: 12/07/2015 CLINICAL DATA:  Nausea and vomiting. EXAM: NUCLEAR MEDICINE HEPATOBILIARY IMAGING TECHNIQUE: Sequential images of the abdomen were obtained out to 60 minutes following intravenous administration of radiopharmaceutical. RADIOPHARMACEUTICALS:  5.0 mCi Tc-58m Choletec IV COMPARISON:  CT scan 12/01/2015 FINDINGS: Symmetric uptake in the liver and prompt excretion into the biliary tree which is visualized by 10 minutes. The gallbladder is visualized at 15 minutes. Small bowel activity is noted at 40 minutes. IMPRESSION: Normal biliary patency study. Electronically Signed   By: PMarijo SanesM.D.   On: 12/07/2015 14:18     Blood pressure 135/66, pulse 76, temperature 98.5 F (36.9 C), resp. rate 17, height 6' (1.829 m), weight 79.1 kg (174 lb 6.1 oz), SpO2 100 %.  PHYSICAL EXAM: Overall appearance:  Trim and healthy. Soft spoken but articulate and intelligent.  Occasional throat clearing. No stridor.  Head:NCAT Ears: clear Nose: clear Oral Cavity: upper plate.  One residual RIGHT mandibular tooth root. Oral Pharynx/Hypopharynx/Larynx:  1.5 x 2 cm swollen uvular tip with whitish eschar, very elongated. Neuro: grossly intact Neck:  Clear  Using the flexible laryngoscope with informed consent and 5 ml 2% viscous xylocaine topical anesthesia, the nasopharynx is clear.  Oropharynx shows the swollen uvular which intrudes on the supraglottic larynx.  Larynx otherwise normal.  Studies Reviewed:  CT  neck    Assessment/Plan Uvular trauma likely from UGI endoscopy last week.  Possible pyogenic granuloma of the uvular tip with gagging and choking from the pharynx and larynx.    Plan:  This may heal in time (several weeks) but is impeding his overall recovery.  I recommend we amputate the uvular tip to hasten the healing process.  I discussed this with him and his wife.  Questions were answered and informed consent obtained.  We will try to do this tomorrow AM.    [name] 15/50/2714, 8:43 AM

## 2015-12-10 ENCOUNTER — Inpatient Hospital Stay (HOSPITAL_COMMUNITY): Payer: Medicare Other | Admitting: Anesthesiology

## 2015-12-10 ENCOUNTER — Encounter (HOSPITAL_COMMUNITY): Payer: Self-pay | Admitting: Anesthesiology

## 2015-12-10 ENCOUNTER — Encounter (HOSPITAL_COMMUNITY)
Admission: EM | Disposition: A | Discharge status: Home Health | Payer: Self-pay | Source: Home / Self Care | Attending: Internal Medicine

## 2015-12-10 HISTORY — PX: UVULECTOMY: SHX2631

## 2015-12-10 LAB — GLUCOSE, CAPILLARY
GLUCOSE-CAPILLARY: 232 mg/dL — AB (ref 65–99)
GLUCOSE-CAPILLARY: 169 mg/dL — AB (ref 65–99)
Glucose-Capillary: 150 mg/dL — ABNORMAL HIGH (ref 65–99)
Glucose-Capillary: 122 mg/dL — ABNORMAL HIGH (ref 65–99)
Glucose-Capillary: 144 mg/dL — ABNORMAL HIGH (ref 65–99)

## 2015-12-10 LAB — CBC
HCT: 34.2 % — ABNORMAL LOW (ref 39.0–52.0)
Hemoglobin: 11.1 g/dL — ABNORMAL LOW (ref 13.0–17.0)
MCH: 28.2 pg (ref 26.0–34.0)
MCHC: 32.5 g/dL (ref 30.0–36.0)
MCV: 87 fL (ref 78.0–100.0)
PLATELETS: 323 10*3/uL (ref 150–400)
RBC: 3.93 MIL/uL — ABNORMAL LOW (ref 4.22–5.81)
RDW: 14.9 % (ref 11.5–15.5)
WBC: 8.7 10*3/uL (ref 4.0–10.5)

## 2015-12-10 LAB — BASIC METABOLIC PANEL
Anion gap: 8 (ref 5–15)
BUN: 8 mg/dL (ref 6–20)
CHLORIDE: 99 mmol/L — AB (ref 101–111)
CO2: 29 mmol/L (ref 22–32)
CREATININE: 1.05 mg/dL (ref 0.61–1.24)
Calcium: 9.5 mg/dL (ref 8.9–10.3)
GFR calc Af Amer: >60 mL/min (ref >60)
GLUCOSE: 155 mg/dL — AB (ref 65–99)
Potassium: 3.5 mmol/L (ref 3.5–5.1)
SODIUM: 136 mmol/L (ref 135–145)

## 2015-12-10 LAB — SURGICAL PCR SCREEN
MRSA, PCR: NEGATIVE
Staphylococcus aureus: NEGATIVE

## 2015-12-10 SURGERY — UVULECTOMY
Anesthesia: General | Site: Mouth

## 2015-12-10 MED ORDER — PROPOFOL 10 MG/ML IV BOLUS
INTRAVENOUS | Status: AC
  Filled 2015-12-10: qty 40

## 2015-12-10 MED ORDER — SUCCINYLCHOLINE CHLORIDE 200 MG/10ML IV SOSY
PREFILLED_SYRINGE | INTRAVENOUS | Status: AC
  Filled 2015-12-10: qty 10

## 2015-12-10 MED ORDER — BISACODYL 10 MG RE SUPP: 10.0000 mg | Freq: Every day | RECTAL | Status: DC | PRN

## 2015-12-10 MED ORDER — HYDROMORPHONE HCL 1 MG/ML IJ SOLN: 0.2500 mg | INTRAMUSCULAR | Status: DC | PRN

## 2015-12-10 MED ORDER — SENNOSIDES-DOCUSATE SODIUM 8.6-50 MG PO TABS
1.0000 | ORAL_TABLET | Freq: Two times a day (BID) | ORAL | Status: DC
  Administered 2015-12-10 – 2015-12-11 (×3): 1 via ORAL
  Filled 2015-12-10 (×3): qty 1

## 2015-12-10 MED ORDER — FENTANYL CITRATE (PF) 100 MCG/2ML IJ SOLN
INTRAMUSCULAR | Status: AC
  Filled 2015-12-10: qty 2

## 2015-12-10 MED ORDER — PHENOL 1.4 % MT LIQD: 1.0000 | OROMUCOSAL | Status: DC | PRN

## 2015-12-10 MED ORDER — ONDANSETRON HCL 4 MG/2ML IJ SOLN
INTRAMUSCULAR | Status: AC
  Filled 2015-12-10: qty 2

## 2015-12-10 MED ORDER — 0.9 % SODIUM CHLORIDE (POUR BTL) OPTIME
TOPICAL | Status: DC | PRN
  Administered 2015-12-10: 1000 mL

## 2015-12-10 MED ORDER — LIDOCAINE HCL (CARDIAC) 20 MG/ML IV SOLN
INTRAVENOUS | Status: DC | PRN
  Administered 2015-12-10: 100 mg via INTRAVENOUS

## 2015-12-10 MED ORDER — MIDAZOLAM HCL 2 MG/2ML IJ SOLN
INTRAMUSCULAR | Status: AC
  Filled 2015-12-10: qty 2

## 2015-12-10 MED ORDER — LACTATED RINGERS IV SOLN
INTRAVENOUS | Status: DC | PRN
  Administered 2015-12-10: 07:00:00 via INTRAVENOUS

## 2015-12-10 MED ORDER — LIDOCAINE-EPINEPHRINE 1 %-1:100000 IJ SOLN
INTRAMUSCULAR | Status: AC
  Filled 2015-12-10: qty 1

## 2015-12-10 MED ORDER — RIVAROXABAN 20 MG PO TABS
20.0000 mg | ORAL_TABLET | Freq: Every day | ORAL | Status: DC
  Administered 2015-12-10: 20 mg via ORAL
  Filled 2015-12-10: qty 1

## 2015-12-10 MED ORDER — PROMETHAZINE HCL 25 MG/ML IJ SOLN: 6.2500 mg | INTRAMUSCULAR | Status: DC | PRN

## 2015-12-10 MED ORDER — LIDOCAINE 2% (20 MG/ML) 5 ML SYRINGE
INTRAMUSCULAR | Status: AC
  Filled 2015-12-10: qty 5

## 2015-12-10 MED ORDER — SUCCINYLCHOLINE CHLORIDE 20 MG/ML IJ SOLN
INTRAMUSCULAR | Status: DC | PRN
  Administered 2015-12-10: 90 mg via INTRAVENOUS

## 2015-12-10 MED ORDER — PROPOFOL 10 MG/ML IV BOLUS
INTRAVENOUS | Status: DC | PRN
  Administered 2015-12-10: 30 mg via INTRAVENOUS
  Administered 2015-12-10: 120 mg via INTRAVENOUS

## 2015-12-10 MED ORDER — LIDOCAINE-EPINEPHRINE 1 %-1:100000 IJ SOLN
INTRAMUSCULAR | Status: DC | PRN
  Administered 2015-12-10: 20 mL

## 2015-12-10 MED ORDER — BUPIVACAINE-EPINEPHRINE 0.5% -1:200000 IJ SOLN
INTRAMUSCULAR | Status: DC | PRN
  Administered 2015-12-10: 10 mL

## 2015-12-10 MED ORDER — CHLORHEXIDINE GLUCONATE 0.12 % MT SOLN
5.0000 mL | Freq: Four times a day (QID) | OROMUCOSAL | Status: DC
  Administered 2015-12-10 (×2): 5 mL via OROMUCOSAL
  Administered 2015-12-10: 18:00:00 via OROMUCOSAL
  Administered 2015-12-11: 5 mL via OROMUCOSAL
  Filled 2015-12-10 (×3): qty 15

## 2015-12-10 SURGICAL SUPPLY — 36 items
BLADE SURG 15 STRL LF DISP TIS (BLADE) IMPLANT
BLADE SURG 15 STRL SS (BLADE)
CANISTER SUCTION 2500CC (MISCELLANEOUS) ×2 IMPLANT
CLEANER TIP ELECTROSURG 2X2 (MISCELLANEOUS) ×2 IMPLANT
COAGULATOR SUCT SWTCH 10FR 6 (ELECTROSURGICAL) IMPLANT
CRADLE DONUT ADULT HEAD (MISCELLANEOUS) ×1 IMPLANT
DECANTER SPIKE VIAL GLASS SM (MISCELLANEOUS) ×1 IMPLANT
DRAPE PROXIMA HALF (DRAPES) IMPLANT
ELECT COATED BLADE 2.86 ST (ELECTRODE) ×2 IMPLANT
ELECT REM PT RETURN 9FT ADLT (ELECTROSURGICAL) ×2
ELECTRODE REM PT RTRN 9FT ADLT (ELECTROSURGICAL) ×1 IMPLANT
GLOVE BIO SURGEON STRL SZ7.5 (GLOVE) ×1 IMPLANT
GLOVE BIO SURGEON STRL SZ8 (GLOVE) ×2 IMPLANT
GLOVE BIOGEL PI IND STRL 8.5 (GLOVE) IMPLANT
GLOVE BIOGEL PI INDICATOR 8.5 (GLOVE) ×2
GLOVE ECLIPSE 8.0 STRL XLNG CF (GLOVE) ×1 IMPLANT
GOWN STRL REUS W/ TWL LRG LVL3 (GOWN DISPOSABLE) ×2 IMPLANT
GOWN STRL REUS W/ TWL XL LVL3 (GOWN DISPOSABLE) IMPLANT
GOWN STRL REUS W/TWL LRG LVL3 (GOWN DISPOSABLE)
GOWN STRL REUS W/TWL XL LVL3 (GOWN DISPOSABLE) ×6
KIT BASIN OR (CUSTOM PROCEDURE TRAY) ×2 IMPLANT
KIT ROOM TURNOVER OR (KITS) ×2 IMPLANT
NDL HYPO 25GX1X1/2 BEV (NEEDLE) ×1 IMPLANT
NEEDLE HYPO 25GX1X1/2 BEV (NEEDLE) ×2 IMPLANT
NS IRRIG 1000ML POUR BTL (IV SOLUTION) ×2 IMPLANT
PAD ARMBOARD 7.5X6 YLW CONV (MISCELLANEOUS) ×4 IMPLANT
PENCIL BUTTON HOLSTER BLD 10FT (ELECTRODE) ×2 IMPLANT
SPECIMEN JAR SMALL (MISCELLANEOUS) ×4 IMPLANT
SPONGE TONSIL 1 RF SGL (DISPOSABLE) ×2 IMPLANT
SUT CHROMIC 4 0 SH 27 (SUTURE) ×1 IMPLANT
SUT VIC AB 3-0 SH 27 (SUTURE)
SUT VIC AB 3-0 SH 27X BRD (SUTURE) ×2 IMPLANT
SYR CONTROL 10ML LL (SYRINGE) ×1 IMPLANT
TOWEL OR 17X24 6PK STRL BLUE (TOWEL DISPOSABLE) ×2 IMPLANT
TRAY ENT MC OR (CUSTOM PROCEDURE TRAY) ×2 IMPLANT
WATER STERILE IRR 1000ML POUR (IV SOLUTION) ×1 IMPLANT

## 2015-12-10 NOTE — Anesthesia Procedure Notes (Signed)
Procedure Name: Intubation Date/Time: 12/10/2015 7:43 AM Performed by: Terrill Mohr Pre-anesthesia Checklist: Patient identified, Emergency Drugs available, Suction available and Patient being monitored Patient Re-evaluated:Patient Re-evaluated prior to inductionOxygen Delivery Method: Circle system utilized Preoxygenation: Pre-oxygenation with 100% oxygen Intubation Type: IV induction Ventilation: Mask ventilation without difficulty Laryngoscope Size: Mac and 4 Grade View: Grade II Tube type: Oral Tube size: 7.0 mm Number of attempts: 1 Airway Equipment and Method: Stylet Placement Confirmation: ETT inserted through vocal cords under direct vision,  positive ETCO2 and breath sounds checked- equal and bilateral Secured at: 22 (cm at gum) cm Tube secured with: Tape Dental Injury: Teeth and Oropharynx as per pre-operative assessment

## 2015-12-10 NOTE — Anesthesia Preprocedure Evaluation (Addendum)
Anesthesia Evaluation  Patient identified by MRN, date of birth, ID band Patient awake    Reviewed: Allergy & Precautions, NPO status , Patient's Chart, lab work & pertinent test results  Airway Mallampati: II  TM Distance: >3 FB Neck ROM: Full    Dental  (+) Edentulous Upper, Edentulous Lower, Dental Advisory Given   Pulmonary    breath sounds clear to auscultation       Cardiovascular hypertension, Pt. on medications and Pt. on home beta blockers + angina + CAD and + Peripheral Vascular Disease   Rhythm:Regular Rate:Normal  Recent AVR, Ao Root Repl, CABG.   Neuro/Psych    GI/Hepatic   Endo/Other  diabetes, Well Controlled, Type 2, Oral Hypoglycemic Agents  Renal/GU      Musculoskeletal   Abdominal   Peds  Hematology   Anesthesia Other Findings   Reproductive/Obstetrics                           Anesthesia Physical Anesthesia Plan  ASA: III  Anesthesia Plan: General   Post-op Pain Management:    Induction: Intravenous  Airway Management Planned: Oral ETT  Additional Equipment:   Intra-op Plan:   Post-operative Plan: Extubation in OR  Informed Consent: I have reviewed the patients History and Physical, chart, labs and discussed the procedure including the risks, benefits and alternatives for the proposed anesthesia with the patient or authorized representative who has indicated his/her understanding and acceptance.   Dental advisory given  Plan Discussed with:   Anesthesia Plan Comments:         Anesthesia Quick Evaluation

## 2015-12-10 NOTE — Progress Notes (Signed)
12/10/2015 5:57 PM  [name], [name] [id number]  Post-Op check  Temp:  [97.7 F (36.5 C)-99.2 F (37.3 C)] 98.5 F (36.9 C) (10/27 1752) Pulse Rate:  [72-97] 81 (10/27 1752) Resp:  [16-22] 16 (10/27 1752) BP: (103-120)/(56-66) 116/61 (10/27 1752) SpO2:  [94 %-100 %] 97 % (10/27 1752),     Intake/Output Summary (Last 24 hours) at 12/10/15 1757 Last data filed at 12/10/15 1607  Gross per 24 hour  Intake              970 ml  Output             1253 ml  Net             -283 ml    Results for orders placed or performed during the hospital encounter of 12/05/15 (from the past 24 hour(s))  Glucose, capillary     Status: Abnormal   Collection Time: 12/09/15  6:14 PM  Result Value Ref Range   Glucose-Capillary 110 (H) 65 - 99 mg/dL  Glucose, capillary     Status: Abnormal   Collection Time: 12/09/15  9:07 PM  Result Value Ref Range   Glucose-Capillary 212 (H) 65 - 99 mg/dL  APTT     Status: Abnormal   Collection Time: 12/09/15 10:10 PM  Result Value Ref Range   aPTT 117 (H) 24 - 36 seconds  Surgical pcr screen     Status: None   Collection Time: 12/10/15  5:27 AM  Result Value Ref Range   MRSA, PCR NEGATIVE NEGATIVE   Staphylococcus aureus NEGATIVE NEGATIVE  Glucose, capillary     Status: Abnormal   Collection Time: 12/10/15  5:45 AM  Result Value Ref Range   Glucose-Capillary 150 (H) 65 - 99 mg/dL   Comment 1 Notify RN    Comment 2 Document in Chart   CBC     Status: Abnormal   Collection Time: 12/10/15  5:49 AM  Result Value Ref Range   WBC 8.7 4.0 - 10.5 K/uL   RBC 3.93 (L) 4.22 - 5.81 MIL/uL   Hemoglobin 11.1 (L) 13.0 - 17.0 g/dL   HCT 34.2 (L) 39.0 - 52.0 %   MCV 87.0 78.0 - 100.0 fL   MCH 28.2 26.0 - 34.0 pg   MCHC 32.5 30.0 - 36.0 g/dL   RDW 14.9 11.5 - 15.5 %   Platelets 323 150 - 400 K/uL  Basic metabolic panel     Status: Abnormal   Collection Time: 12/10/15  5:49 AM  Result Value Ref Range   Sodium 136 135 - 145 mmol/L   Potassium 3.5 3.5 - 5.1 mmol/L    Chloride 99 (L) 101 - 111 mmol/L   CO2 29 22 - 32 mmol/L   Glucose, Bld 155 (H) 65 - 99 mg/dL   BUN 8 6 - 20 mg/dL   Creatinine, Ser 1.05 0.61 - 1.24 mg/dL   Calcium 9.5 8.9 - 10.3 mg/dL   GFR calc non Af Amer >60 >60 mL/min   GFR calc Af Amer >60 >60 mL/min   Anion gap 8 5 - 15  Glucose, capillary     Status: Abnormal   Collection Time: 12/10/15  8:26 AM  Result Value Ref Range   Glucose-Capillary 122 (H) 65 - 99 mg/dL   Comment 1 Notify RN    Comment 2 Document in Chart   Glucose, capillary     Status: Abnormal   Collection Time: 12/10/15 12:02 PM  Result Value Ref Range  Glucose-Capillary 144 (H) 65 - 99 mg/dL   Comment 1 Notify RN    Comment 2 Document in Chart   Glucose, capillary     Status: Abnormal   Collection Time: 12/10/15  4:06 PM  Result Value Ref Range   Glucose-Capillary 232 (H) 65 - 99 mg/dL   Comment 1 Notify RN     SUBJECTIVE:  Sl throat pain. Already less sense of choking and gagging.  No bleeding  OBJECTIVE:   Sl uvular swelling. Voice and breathing good.  IMPRESSION:  Satisfactory check  PLAN:  Advance diet slowly.  Will recheck in AM.  [name]

## 2015-12-10 NOTE — Discharge Instructions (Signed)
Rinse throat with cool dilute salt water as desired. Advance diet as comfortable Recheck my office 2 weeks, 0000000 (Dr. Erik Obey)

## 2015-12-10 NOTE — Progress Notes (Signed)
ANTICOAGULATION CONSULT NOTE - Follow Up Consult  Pharmacy Consult for Heparin (while Xarelto on hold) Indication: atrial fibrillation  Allergies  Allergen Reactions  . No Known Allergies     Patient Measurements: Height: 6' (182.9 cm) Weight: 174 lb 6.1 oz (79.1 kg) IBW/kg (Calculated) : 77.6  Vital Signs: Temp: 99.2 F (37.3 C) (10/26 2109) Temp Source: Oral (10/26 2109) BP: 115/64 (10/26 2109) Pulse Rate: 97 (10/26 2109)  Labs:  Recent Labs  12/08/15 1509 12/09/15 0428 12/09/15 1306 12/09/15 2210  HGB 10.6* 10.6*  --   --   HCT 32.4* 32.5*  --   --   PLT 304 306  --   --   APTT  --   --  37* 117*  HEPARINUNFRC  --   --  1.03*  --   CREATININE 1.03 0.98  --   --     Estimated Creatinine Clearance: 79.2 mL/min (by C-G formula based on SCr of 0.98 mg/dL).  Assessment: Heparin while Xarelto on hold, heparin off at 0330 this AM for procedure today, aPTT is slightly elevated this AM, using aPTT to dose for now given Xarelto influence on anti-Xa, no issues per RN.   Goal of Therapy:  Heparin level 0.3-0.7 units/ml aPTT 66-102 seconds Monitor platelets by anticoagulation protocol: Yes   Plan:  -Dec heparin to 950 units/hr -Heparin off at 0330 for procedure -F/U timing of heparin re-start after procedure  Narda Bonds 12/10/2015,12:04 AM

## 2015-12-10 NOTE — Progress Notes (Signed)
Report given to ronda rn as caregiver 

## 2015-12-10 NOTE — Progress Notes (Signed)
ANTICOAGULATION CONSULT NOTE - Initial Consult  Pharmacy Consult for Xarelto Indication: atrial fibrillation  Allergies  Allergen Reactions  . No Known Allergies     Patient Measurements: Height: 6' (182.9 cm) Weight: 174 lb 6.1 oz (79.1 kg) IBW/kg (Calculated) : 77.6  Vital Signs: Temp: 98 F (36.7 C) (10/27 0953) Temp Source: Oral (10/27 0953) BP: 116/56 (10/27 0953) Pulse Rate: 72 (10/27 0953)  Labs:  Recent Labs  12/08/15 1509 12/09/15 0428 12/09/15 1306 12/09/15 2210 12/10/15 0549  HGB 10.6* 10.6*  --   --  11.1*  HCT 32.4* 32.5*  --   --  34.2*  PLT 304 306  --   --  323  APTT  --   --  37* 117*  --   HEPARINUNFRC  --   --  1.03*  --   --   CREATININE 1.03 0.98  --   --  1.05    Estimated Creatinine Clearance: 73.9 mL/min (by C-G formula based on SCr of 1.05 mg/dL).   Medical History: Past Medical History:  Diagnosis Date  . 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 Carpal tunnel syndrome 09/24/2014   Bilateral  . 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 Diabetes mellitus, type 2 ([place])   . Erectile dysfunction   . Essential hypertension   . Hyperlipidemia    Assessment: 70 year old male on Xarelto prior to admission for nonvalvular atrial fibrillation which was held and converted to heparin drip for surgery. Patient is now post-op day #0 from uvulectomy and pharmacy has been consulted by Dr. Tyrell Antonio to restart Xarelto therapy.   Concern for high bleed risk area - discussed with Dr. Tyrell Antonio and Dr. Erik Obey. Dr Erik Obey is aware of immediate effect of Xarelto but stated that he sutured wound closed and is OK for restart of Xarelto this PM.   Current CBC is stable. Platelets are within normal limits. No current bleeding noted.   Goal of Therapy:  Monitor platelets by anticoagulation protocol: Yes   Plan:  After discussion and clearance given by both Dr. Tyrell Antonio and Dr. Erik Obey, restart  Xarelto 20 mg po daily with supper this PM.  Monitor closely for signs and symptoms of bleeding.   Sloan Leiter, PharmD, BCPS Clinical Pharmacist [phone] 12/10/2015,12:03 PM

## 2015-12-10 NOTE — Anesthesia Postprocedure Evaluation (Signed)
Anesthesia Post Note  Patient: [name]  Procedure(s) Performed: Procedure(s) (LRB): UVULECTOMY (N/A)  Patient location during evaluation: PACU Anesthesia Type: General Level of consciousness: awake and alert Pain management: pain level controlled Vital Signs Assessment: post-procedure vital signs reviewed and stable Respiratory status: spontaneous breathing, nonlabored ventilation, respiratory function stable and patient connected to nasal cannula oxygen Cardiovascular status: blood pressure returned to baseline and stable Postop Assessment: no signs of nausea or vomiting Anesthetic complications: no    Last Vitals:  Vitals:   12/10/15 0910 12/10/15 0953  BP:  (!) 116/56  Pulse: 80 72  Resp: (!) 22 18  Temp: 36.7 C 36.7 C    Last Pain:  Vitals:   12/10/15 0953  TempSrc: Oral  PainSc:                  Jantz Main,JAMES TERRILL

## 2015-12-10 NOTE — Transfer of Care (Signed)
Immediate Anesthesia Transfer of Care Note  Patient: [name]  Procedure(s) Performed: Procedure(s): UVULECTOMY (N/A)  Patient Location: PACU  Anesthesia Type:General  Level of Consciousness: awake, oriented and patient cooperative  Airway & Oxygen Therapy: Patient Spontanous Breathing and Patient connected to nasal cannula oxygen  Post-op Assessment: Report given to RN and Patient moving all extremities  Post vital signs: Reviewed and stable  Last Vitals:  Vitals:   12/09/15 2109 12/10/15 0530  BP: 115/64 120/65  Pulse: 97 74  Resp: 20 18  Temp: 37.3 C 37.1 C    Last Pain:  Vitals:   12/09/15 2109  TempSrc: Oral  PainSc:       Patients Stated Pain Goal: 2 ([id number])  Complications: No apparent anesthesia complications

## 2015-12-10 NOTE — Progress Notes (Signed)
PROGRESS NOTE  [name] [id number] DOB: 01-Oct-1945 DOA: 12/05/2015 PCP: [name], NP   LOS: 2 days   Brief Narrative: Patient is a 70 y.o. male with recent history of 4 vessel CABG and bioprosthetic aortic valve replacement in September 2015, following which he has had 2 hospitalizations for vomiting, orthostatic hypotension and UTI-admitted again on 10/17 for recurrent vomiting. He underwent further evaluation with EGD on 12/02/15 without findings to account for vomiting. CT had no significant findings. Patient discharged on 12/03/15. He presented to the ED on 12/05/15 for sore throat that has gotten progressively worse over 3 days. Patient has had dysphagia and odynophagia since the EGD procedure. He reports spitting up blood and vomited 10 times yesterday. Denies fever, chills, dyspnea, chest pain, and cough. Vomiting occurs when he talks for long periods of time. Today he was able to eat broth and drink fluids. CT of soft tissue of neck on 12/06/15 shows apposed vocal cords, most compatible with phonation though, there could be a component of laryngitis, 6 x 12 mm RIGHT sublingual gland sialolith without sialoadenitis, and persistent LEFT lung atelectasis, with pleural effusion.    Assessment & Plan: Principal Problem:   Uvular swelling Active Problems:   Type II diabetes mellitus with neurological manifestations, uncontrolled ([place])   Hyperlipidemia   Aortic stenosis s/p tissue valve (Sept 2017)   Hx of CABG (Sept 2017)   PAF (paroxysmal atrial fibrillation) (H[facility])   Coronary artery disease involving coronary bypass graft of native heart without angina pectoris   Sore throat   Nausea & vomiting   Difficulty swallowing  Nausea/vomiting and orthostatic hypotension - Patient has been having intractable nausea and vomiting and orthostatic hypotension ever since he had his CABG couple of months ago. He was feeling fine prior to that. He was worked up extensively in the past  without any clear answers, and patient is continuing to be symptomatic - he was worked up for adrenal insufficiency with a normal ACTH stimulation test  - GI was consulted and have seen the patient last hospitalization, patient underwent an EGD which was unremarkable  - he also had Gastric emptying study which was unremarkable as well  -HIDA scan negative - Continue zofran 4mg  prn - Continue pepcid 20mg  Q12H -One consideration was that   Amidoarone could cause this symptoms. Dr Cruzita Lederer Discussed with Dr. Acie Fredrickson, and he agrees with holding amiodarone now that patient is 2 months post op. Reccomends low dose BB, will start Metoprolol 12.5 and monitor on telemetry. If his symptoms are amiodarone induced, his orthostasis should resolve when Amio is out of his system (may be few weeks). He is not hypotensive and his BP is in the 130-140s currently, continue to hold Midodrine.  PT evaluation.   Uvular swelling/dysphagia, Pyogenic granuloma.  - EGD performed 12/02/15 ?whether this caused local irritation - Rapid strep was positive, s/p treatment with single im Penicillin dose - d/w Dr. Erik Obey with ENT on [id number], recommended observation and reflux treatment, he reviewed the CT - patient still complaining of difficulty with swallowing, throat scratching, feels uvula cause obstruction, amd make him cough and vomit.  -Appreciate  Dr Erik Obey help. 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   Type II diabetes mellitus - Lantus 15U/Novalog 0-9U - poorly controlled, last A1C 11 - fasting 111 today, continue current regimen   Paroxysmal atrial fibrillation: CHADSVASc = 4 Discussed with Dr Erik Obey , ok to resume xarelto, risk for bleeding is minimal.   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 Disposition Plan: TBD  Consultants:   ENT   Procedures:   None  Antimicrobials:  Penicillin IM 1.2 mil U  x 1  Subjective: He just came from procedure. Feeling ok. No significant pain.   Objective: Vitals:   12/10/15 0830 12/10/15 0855 12/10/15 0910 12/10/15 0953  BP: 120/63 103/65  (!) 116/56  Pulse: 79 79 80 72  Resp: (!) 22 20 (!) 22 18  Temp:   98.1 F (36.7 C) 98 F (36.7 C)  TempSrc:    Oral  SpO2: 100% 94% 96% 99%  Weight:      Height:        Intake/Output Summary (Last 24 hours) at 12/10/15 1102 Last data filed at 12/10/15 0827  Gross per 24 hour  Intake              450 ml  Output              503 ml  Net              -53 ml   Filed Weights   12/05/15 2129 12/06/15 0915 12/07/15 1243  Weight: 83.9 kg (185 lb) 78.2 kg (172 lb 6.4 oz) 79.1 kg (174 lb 6.1 oz)    Examination: Constitutional: NAD Vitals:   12/10/15 0830 12/10/15 0855 12/10/15 0910 12/10/15 0953  BP: 120/63 103/65  (!) 116/56  Pulse: 79 79 80 72  Resp: (!) 22 20 (!) 22 18  Temp:   98.1 F (36.7 C) 98 F (36.7 C)  TempSrc:    Oral  SpO2: 100% 94% 96% 99%  Weight:      Height:       General: No acute distress or gross abnormalities. Patient is spitting up saliva in room and has difficulty speaking Eyes:  lids and conjunctivae normal ENMT: Mucous membranes are moist.  No bloody secretions.  Neck: normal, supple, no masses, no thyromegaly Respiratory: clear to auscultation bilaterally, no wheezing, no crackles. Normal respiratory effort. No accessory muscle use.  Cardiovascular: Regular rate and rhythm. 3/6 SEM, soft click  Abdomen: no tenderness. Bowel sounds positive.   Data Reviewed: I have personally reviewed following labs and imaging studies  CBC:  Recent Labs Lab 12/06/15 0022 12/06/15 0028 12/06/15 0440 12/08/15 1509 12/09/15 0428 12/10/15 0549  WBC 10.9*  --  9.4 8.0 7.2 8.7  NEUTROABS 7.9*  --   --   --   --   --   HGB 10.9* 11.9* 10.4* 10.6* 10.6* 11.1*  HCT 32.8* 35.0* 32.3* 32.4* 32.5* 34.2*  MCV 86.1  --  86.8 86.9 86.7 87.0  PLT 276  --  287 304 306 [id number]   Basic  Metabolic Panel:  Recent Labs Lab 12/06/15 0028 12/06/15 0440 12/08/15 1509 12/09/15 0428 12/10/15 0549  NA 136 135 137 136 136  K 4.1 4.4 3.6 3.5 3.5  CL 99* 99* 99* 98* 99*  CO2  --  29 29 29 29   GLUCOSE 174* 180* 144* 113* 155*  BUN 9 7 6  <5* 8  CREATININE 1.10 1.16 1.03 0.98 1.05  CALCIUM  --  9.3 9.7 9.4 9.5   GFR: Estimated Creatinine Clearance: 73.9 mL/min (by C-G formula based on SCr of 1.05 mg/dL). Liver Function Tests: No results for input(s): AST, ALT, ALKPHOS, BILITOT, PROT, ALBUMIN in the last 168 hours. No results for input(s): LIPASE, AMYLASE in the last 168 hours.  No results for input(s): AMMONIA in the last 168 hours. Coagulation Profile: No results for input(s): INR, PROTIME in the last 168 hours. Cardiac Enzymes: No results for input(s): CKTOTAL, CKMB, CKMBINDEX, TROPONINI in the last 168 hours. BNP (last 3 results) No results for input(s): PROBNP in the last 8760 hours. HbA1C: No results for input(s): HGBA1C in the last 72 hours. CBG:  Recent Labs Lab 12/09/15 1240 12/09/15 1814 12/09/15 2107 12/10/15 0545 12/10/15 0826  GLUCAP 148* 110* 212* 150* 122*   Lipid Profile: No results for input(s): CHOL, HDL, LDLCALC, TRIG, CHOLHDL, LDLDIRECT in the last 72 hours. Thyroid Function Tests: No results for input(s): TSH, T4TOTAL, FREET4, T3FREE, THYROIDAB in the last 72 hours. Anemia Panel: No results for input(s): VITAMINB12, FOLATE, FERRITIN, TIBC, IRON, RETICCTPCT in the last 72 hours. Urine analysis:    Component Value Date/Time   COLORURINE YELLOW 11/30/2015 1250   APPEARANCEUR HAZY (A) 11/30/2015 1250   LABSPEC 1.011 11/30/2015 1250   PHURINE 6.5 11/30/2015 1250   GLUCOSEU NEGATIVE 11/30/2015 1250   GLUCOSEU NEGATIVE 04/06/2010 1554   HGBUR TRACE (A) 11/30/2015 1250   BILIRUBINUR NEGATIVE 11/30/2015 1250   BILIRUBINUR Negative 04/03/2014 1642   KETONESUR NEGATIVE 11/30/2015 1250   PROTEINUR 30 (A) 11/30/2015 1250   UROBILINOGEN 1.0  04/03/2014 1642   UROBILINOGEN 1.0 04/03/2014 0058   NITRITE NEGATIVE 11/30/2015 1250   LEUKOCYTESUR LARGE (A) 11/30/2015 1250   Sepsis Labs: Invalid input(s): PROCALCITONIN, LACTICIDVEN  Recent Results (from the past 240 hour(s))  Urine culture     Status: None   Collection Time: 11/30/15 12:50 PM  Result Value Ref Range Status   Specimen Description URINE, RANDOM  Final   Special Requests NONE  Final   Culture NO GROWTH 1 DAY  Final   Report Status 12/02/2015 FINAL  Final  Rapid strep screen (not at [facility])     Status: Abnormal   Collection Time: 12/06/15  9:20 AM  Result Value Ref Range Status   Streptococcus, Group A Screen (Direct) POSITIVE (A) NEGATIVE Final  MRSA PCR Screening     Status: None   Collection Time: 12/06/15 10:23 AM  Result Value Ref Range Status   MRSA by PCR NEGATIVE NEGATIVE Final    Comment:        The GeneXpert MRSA Assay (FDA approved for NASAL specimens only), is one component of a comprehensive MRSA colonization surveillance program. It is not intended to diagnose MRSA infection nor to guide or monitor treatment for MRSA infections.   Surgical pcr screen     Status: None   Collection Time: 12/10/15  5:27 AM  Result Value Ref Range Status   MRSA, PCR NEGATIVE NEGATIVE Final   Staphylococcus aureus NEGATIVE NEGATIVE Final    Comment:        The Xpert SA Assay (FDA approved for NASAL specimens in patients over 60 years of age), is one component of a comprehensive surveillance program.  Test performance has been validated by [facility] for patients greater than or equal to 67 year old. It is not intended to diagnose infection nor to guide or monitor treatment.       Radiology Studies: No results found. Scheduled Meds: . chlorhexidine  5 mL Mouth/Throat QID  . famotidine  20 mg Oral BID  . insulin aspart  0-9 Units Subcutaneous TID WC  . insulin glargine  15 Units Subcutaneous Q2200  . metoprolol tartrate  12.5 mg Oral BID  .  senna-docusate  1 tablet Oral BID  .  sodium chloride flush  3 mL Intravenous Q12H    Niel Hummer, MD [facility] Pager [phone]  If 7PM-7AM, please contact night-coverage [email or web address] Password [email or web address] 12/10/2015, 11:02 AM

## 2015-12-10 NOTE — Op Note (Signed)
12/10/2015  8:14 AM    Chesley Mires  [id number]   Pre-Op Dx:  Uvula pyogenic granuloma  Post-op Dx: Same  Proc: Partial uvulectomy   Surg:  [name] T MD  Anes:  GOT  EBL:  None  Comp:  None  Findings:  Roughly 1 x 1.5 cm irregular tissue at the tip of the uvula grossly elongated.  Procedure: With the patient in a comfortable supine position, general orotracheal anesthesia was induced without difficulty. At an appropriate level, the table was turned 90 and the patient placed in Trendelenburg. A clean preparation and draping was accomplished.  A surgical timeout was performed in the standard fashion.  Taking care to protect lips and endotracheal tube, a McIvor mouthgag was placed suspended in the standard fashion. The findings were as described above. 1/2% Marcaine with 1-200,000 epinephrine, 2 mL's total was infiltrated in the base of the uvula for intraoperative hemostasis and postoperative anesthesia. Several minutes were allowed for this to take effect.  The uvular tip was grasped and distracted downward. An inverted "V" was incised on the anterior mucosa and carried down through the muscular uvula. The posterior mucosa was fashioned with a "V" flap. Hemostasis was observed. After removing the specimen, the flap was brought forward and secured to the anterior mucosa with 4-0 chromic sutures.   At this point the procedure was completed. Patient was returned to anesthesia, awakened, extubated, and transferred to recovery in stable condition.  Dispo:   PACU to 6 N.  Plan:  Oral wound hygiene. We should know fairly quickly if this is helping him with the protracted gagging, nausea, and vomiting.  Follow-up my office 2 weeks. Tyson Alias MD

## 2015-12-11 LAB — GLUCOSE, CAPILLARY
GLUCOSE-CAPILLARY: 143 mg/dL — AB (ref 65–99)
GLUCOSE-CAPILLARY: 187 mg/dL — AB (ref 65–99)

## 2015-12-11 MED ORDER — FAMOTIDINE 20 MG PO TABS: 20.0000 mg | ORAL_TABLET | Freq: Two times a day (BID) | ORAL | 0 refills | Status: DC

## 2015-12-11 MED ORDER — INSULIN GLARGINE 100 UNIT/ML SOLOSTAR PEN: 15.0000 [IU] | PEN_INJECTOR | Freq: Every day | SUBCUTANEOUS | 5 refills | Status: DC

## 2015-12-11 MED ORDER — ONDANSETRON 4 MG PO TBDP: 4.0000 mg | ORAL_TABLET | Freq: Three times a day (TID) | ORAL | 0 refills | Status: DC | PRN

## 2015-12-11 MED ORDER — SENNOSIDES-DOCUSATE SODIUM 8.6-50 MG PO TABS: 1.0000 | ORAL_TABLET | Freq: Two times a day (BID) | ORAL | 0 refills | Status: DC

## 2015-12-11 MED ORDER — BISACODYL 10 MG RE SUPP
10.0000 mg | Freq: Once | RECTAL | Status: AC
  Administered 2015-12-11: 10 mg via RECTAL
  Filled 2015-12-11: qty 1

## 2015-12-11 MED ORDER — METOPROLOL TARTRATE 25 MG PO TABS: 12.5000 mg | ORAL_TABLET | Freq: Two times a day (BID) | ORAL | 0 refills | Status: DC

## 2015-12-11 NOTE — Progress Notes (Signed)
12/11/2015 8:57 AM  Chesley Mires [id number]  Post-Op Day 1    Temp:  [98 F (36.7 C)-98.5 F (36.9 C)] 98.1 F (36.7 C) (10/28 0510) Pulse Rate:  [72-83] 76 (10/28 0510) Resp:  [16-22] 17 (10/28 0510) BP: (106-121)/(56-63) 121/63 (10/28 0510) SpO2:  [96 %-100 %] 100 % (10/28 0510),     Intake/Output Summary (Last 24 hours) at 12/11/15 0857 Last data filed at 12/11/15 0600  Gross per 24 hour  Intake              760 ml  Output             1525 ml  Net             -765 ml    Results for orders placed or performed during the hospital encounter of 12/05/15 (from the past 24 hour(s))  Glucose, capillary     Status: Abnormal   Collection Time: 12/10/15 12:02 PM  Result Value Ref Range   Glucose-Capillary 144 (H) 65 - 99 mg/dL   Comment 1 Notify RN    Comment 2 Document in Chart   Glucose, capillary     Status: Abnormal   Collection Time: 12/10/15  4:06 PM  Result Value Ref Range   Glucose-Capillary 232 (H) 65 - 99 mg/dL   Comment 1 Notify RN   Glucose, capillary     Status: Abnormal   Collection Time: 12/10/15  9:04 PM  Result Value Ref Range   Glucose-Capillary 169 (H) 65 - 99 mg/dL    SUBJECTIVE:  Throat feels good with no more gagging or choking.  Abdominal nausea and vomiting seems to have cleared with change in medications.  OBJECTIVE:  Sl echhymotic uvular stump, but min swelling. Voice strong.  Breathing easily. Eating solid food!  IMPRESSION:  Satisfactory check  PLAN:  Recheck my office 2-3 weeks.   Diet as comfortable  [name]

## 2015-12-11 NOTE — Discharge Summary (Signed)
Physician Discharge Summary  [name] [id number] DOB: [birth year] DOA: 12/05/2015  PCP: [name], NP  Admit date: 12/05/2015 Discharge date: 12/11/2015  Admitted From: home  Disposition:  Home   Recommendations for Outpatient Follow-up:  1. Follow up with PCP in 1-2 weeks 2. Please obtain BMP/CBC in one week 3. Needs to follow up with cardiology , needs repeat orthostatic vitals, management of A fib.   Home Health: Yes    Discharge Condition: stable.  CODE STATUS; full code.  Diet recommendation: Heart Healthy / Carb Modified   Brief/Interim Summary: Patient is a 70 y.o.malewith recent history of 4 vessel CABG and bioprosthetic aortic valve replacement in September 2015, following which he has had 2 hospitalizations for vomiting, orthostatic hypotension and UTI-admitted again on 10/17 for recurrent vomiting. He underwent further evaluation with EGD on 12/02/15 without findings to account for vomiting. CT had no significant findings. Patient discharged on 12/03/15. He presented to the ED on 12/05/15 for sore throat that has gotten progressively worse over 3 days. Patient has had dysphagia and odynophagia since the EGD procedure. He reports spitting up blood and vomited 10 times yesterday. Denies fever, chills, dyspnea, chest pain, and cough. Vomiting occurs when he talks for long periods of time. Today he was able to eat broth and drink fluids. CT of soft tissue of neck on 12/06/15 shows apposed vocal cords, most compatible with phonation though, there could be a component of laryngitis, 6 x 12 mm RIGHT sublingual gland sialolith without sialoadenitis, and persistent LEFT lung atelectasis, with pleural effusion.    Assessment & Plan: Nausea/vomiting and orthostatic hypotension - Patient has been having intractable nausea and vomiting and orthostatic hypotension ever since he had his CABG couple of months ago. He was feeling fine prior to that. He was worked up extensively  in the past without any clear answers, and patient is continuing to be symptomatic - he was worked up for adrenal insufficiency with a normal ACTH stimulation test  - GI was consulted and have seen the patient last hospitalization, patient underwent an EGD which was unremarkable  - he also had Gastric emptying study which was unremarkable as well  -HIDA scan negative - Continue zofran 4mg  prn - Continue pepcid 20mg  Q12H -One consideration was that   Amidoarone could cause this symptoms. Dr Cruzita Lederer Discussed with Dr. Acie Fredrickson, and he agrees with holding amiodarone now that patient is 2 months post op. Reccomends low dose BB. Patient was started on Metoprolol 12.5 and monitor on telemetry. If his symptoms are amiodarone induced, his orthostasis should resolve when Amio is out of his system (may be few weeks). He is not hypotensive and his BP is in the 130-140s currently, continue to hold Midodrine.  PT evaluation.  symptoms has improved.   Uvular swelling/dysphagia, Pyogenic granuloma.  - EGD performed 12/02/15 ?whether this caused local irritation - Rapid strep was positive, s/p treatment with single im Penicillin dose - d/w Dr. Erik Obey with ENT on [id number], recommended observation and reflux treatment, he reviewed the CT - patient still complaining of difficulty with swallowing, throat scratching, feels uvula cause obstruction, amd make him cough and vomit.  -Appreciate  Dr Erik Obey help. 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  patient feeling better, no significant nausea, no further vomiting.   Type II diabetes mellitus - Lantus 15U/Novalog 0-9U - poorly controlled, last A1C 11 - fasting 111 today, continue current regimen  discharge on current regimen.   Paroxysmal atrial fibrillation: CHADSVASc = 4 Discussed  with Dr Erik Obey , ok to resume xarelto, risk for bleeding is minimal.  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)  Discharge  Diagnoses:  Principal Problem:   Uvular swelling Active Problems:   Type II diabetes mellitus with neurological manifestations, uncontrolled ([place])   Hyperlipidemia   Aortic stenosis s/p tissue valve (Sept 2017)   Hx of CABG (Sept 2017)   PAF (paroxysmal atrial fibrillation) (HCC)   Coronary artery disease involving coronary bypass graft of native heart without angina pectoris   Sore throat   Nausea & vomiting   Difficulty swallowing    Discharge Instructions  Discharge Instructions    Diet - low sodium heart healthy    Complete by:  As directed    Increase activity slowly    Complete by:  As directed        Medication List    STOP taking these medications   amiodarone 200 MG tablet Commonly known as:  PACERONE   famotidine 10 MG chewable tablet Commonly known as:  PEPCID AC Replaced by:  famotidine 20 MG tablet   insulin lispro 100 UNIT/ML KiwkPen Commonly known as:  HUMALOG KWIKPEN   midodrine 10 MG tablet Commonly known as:  PROAMATINE   pantoprazole 40 MG tablet Commonly known as:  PROTONIX   saccharomyces boulardii 250 MG capsule Commonly known as:  FLORASTOR     TAKE these medications   atorvastatin 40 MG tablet Commonly known as:  LIPITOR Take 1 tablet (40 mg total) by mouth daily.   B-D ULTRAFINE III SHORT PEN 31G X 8 MM Misc Generic drug:  Insulin Pen Needle USE 4 TIMES DAILY AS DIRECTED   famotidine 20 MG tablet Commonly known as:  PEPCID Take 1 tablet (20 mg total) by mouth 2 (two) times daily. Replaces:  famotidine 10 MG chewable tablet   ferrous gluconate 324 MG tablet Commonly known as:  FERGON Take 1 tablet (324 mg total) by mouth 2 (two) times daily with a meal.   folic acid 1 MG tablet Commonly known as:  FOLVITE Take 1 tablet (1 mg total) by mouth daily.   glucose blood test strip Use as instructed   Insulin Glargine 100 UNIT/ML Solostar Pen Commonly known as:  LANTUS SOLOSTAR Inject 15 Units into the skin daily at 10  pm. What changed:  how much to take   metFORMIN 1000 MG tablet Commonly known as:  GLUCOPHAGE Take 1 tablet (1,000 mg total) by mouth 2 (two) times daily with a meal.   metoprolol tartrate 25 MG tablet Commonly known as:  LOPRESSOR Take 0.5 tablets (12.5 mg total) by mouth 2 (two) times daily.   ondansetron 4 MG disintegrating tablet Commonly known as:  ZOFRAN ODT Take 1 tablet (4 mg total) by mouth every 8 (eight) hours as needed for nausea or vomiting.   rivaroxaban 20 MG Tabs tablet Commonly known as:  XARELTO Take 1 tablet (20 mg total) by mouth daily with supper.   senna-docusate 8.6-50 MG tablet Commonly known as:  Senokot-S Take 1 tablet by mouth 2 (two) times daily.      Follow-up Information    [name], MD. Schedule an appointment as soon as possible for a visit in 2 weeks.   Specialty:  Otolaryngology Contact information: [address] [place] [address] [phone]          Allergies  Allergen Reactions  . No Known Allergies     Consultations:  Dr Erik Obey   Procedures/Studies: Ct Abdomen Pelvis Wo Contrast  Result Date: 12/01/2015 CLINICAL DATA:  Chronic nausea and vomiting. Prior CABG last month. Recurrent urinary tract infection. EXAM: CT ABDOMEN AND PELVIS WITHOUT CONTRAST TECHNIQUE: Multidetector CT imaging of the abdomen and pelvis was performed following the standard protocol without IV contrast. COMPARISON:  Abdominal radiograph of 11/14/2015 FINDINGS: Lower chest: Left lower lobe airspace opacity primarily from atelectasis but possibly with superimposed airspace filling process such as pneumonia. Small left pleural effusion, nonspecific for transudative or exudative etiology. Mild atelectasis in the right lower lobe. Small pericardial effusion. Aortic valve prosthesis. Coronary artery atherosclerotic calcification and prior median sternotomy. The sternotomy is still visible with about 3 mm of bony separation at the lower  sternotomy site. Several of the lower sternotomy wires capture only the very margin of the left side of the sternotomy. Hepatobiliary: Dependent density in the gallbladder otherwise unremarkable. Pancreas: Unusual pancreatic contour which is smooth but thin, with little stippling of the pancreatic parenchyma ; appearance not changed from 08/27/2015. Spleen: Unremarkable Adrenals/Urinary Tract: 3 mm left kidney lower pole nonobstructive calculus. No hydronephrosis. Thick-walled urinary bladder diffusely. Stomach/Bowel: Mild prominence of stool in the rectum. Orally administered contrast extends down through to the rectum. Appendix normal. Vascular/Lymphatic: Aortoiliac atherosclerotic vascular disease. No pathologic adenopathy identified. Reproductive: Unremarkable Other: Unremarkable Musculoskeletal: A transitional lumbosacral vertebra is assumed to represent the S1 level. Careful correlation with this numbering strategy prior to any procedural intervention would be recommended. Lumbar spondylosis and degenerative disc disease cause bilateral foraminal stenosis at L3-4, L4-5, and L5-S1. Direct left inguinal hernia contains adipose tissue. There is degenerative arthropathy of both hips. IMPRESSION: 1. Urinary bladder diffuse wall thickening may reflect cystitis. 2. Nonobstructive 3 mm left kidney lower pole nonobstructive calculus. 3. Left lower lobe airspace opacity partially from atelectasis but partially from airspace filling process, pneumonia not excluded. Small left pleural effusion. 4. Postoperative findings including recent median sternotomy and small pericardial effusion. Two of the 3 visualized lower sternotomy wires only capture the edge of the left side of the sternotomy ; this is probably incidental and dehiscence is considered unlikely, but correlate with any symptoms directly related to the sternum. 5. Dependent density in the gallbladder from sludge or gallstones. 6. Mild prominence of stool in the  rectum, probably incidental. 7.  Aortoiliac atherosclerotic vascular disease. 8. Lumbar spondylosis and degenerative disc disease causing foraminal impingement at L3-4, L4-5, and L5-S1 (assumes transitional S1 vertebra). 9. Direct left inguinal hernia contains adipose tissue. 10. Degenerative arthropathy of both hips. Electronically Signed   By: Van Clines M.D.   On: 12/01/2015 14:07   Dg Chest 2 View  Result Date: 11/30/2015 CLINICAL DATA:  Left chest wall pain EXAM: CHEST  2 VIEW COMPARISON:  11/24/2015 FINDINGS: Aortic valve replacement and CABG again noted. Heart size mildly enlarged. Negative for heart failure Left lower lobe consolidation unchanged. Minimal right lower lobe airspace disease is improved. Probable small left effusion IMPRESSION: Left lower lobe consolidation unchanged. Mild right lower lobe airspace disease slightly improved. Electronically Signed   By: Franchot Gallo M.D.   On: 11/30/2015 12:33   Dg Chest 2 View  Result Date: 11/14/2015 CLINICAL DATA:  Medical hx: cancer, diabetes, hypertension. Patient is lightheaded with limited mobility. EXAM: CHEST  2 VIEW COMPARISON:  11/06/2015 FINDINGS: The cardiac silhouette is mildly enlarged. Changes from cardiac surgery and valve replacement are stable. No mediastinal or hilar masses.  No convincing adenopathy. There is left greater than right lung base  opacity mildly improved from the prior study. This may reflect pneumonia, atelectasis or a combination. Minimal left pleural effusion. No pneumothorax. Remainder of the lungs is clear. IMPRESSION: 1. Mild improvement in left greater than right lung base opacity. Decreased left pleural fluid. Residual lung base opacity is likely atelectasis. Pneumonia is possible. 2. No pulmonary edema.  No new abnormalities. Electronically Signed   By: Lajean Manes M.D.   On: 11/14/2015 09:27   Dg Abd 1 View  Result Date: 11/14/2015 CLINICAL DATA:  Medical hx: cancer, diabetes, hypertension.  Patient is lightheaded with limited mobility. EXAM: ABDOMEN - 1 VIEW COMPARISON:  None. FINDINGS: Normal bowel gas pattern. No evidence of renal or ureteral stones. Scattered aortic and iliac artery vascular calcifications. Soft tissues are otherwise unremarkable. No significant bony abnormality. IMPRESSION: No acute findings. Electronically Signed   By: Lajean Manes M.D.   On: 11/14/2015 09:28   Ct Head Wo Contrast  Result Date: 11/24/2015 CLINICAL DATA:  Headache and dizziness since bypass surgery on 10/29/2015. EXAM: CT HEAD WITHOUT CONTRAST TECHNIQUE: Contiguous axial images were obtained from the base of the skull through the vertex without intravenous contrast. COMPARISON:  11/13/2015 FINDINGS: Brain: The ventricles are normal in size and configuration. No extra-axial fluid collections are identified. The gray-white differentiation is normal. No CT findings for acute intracranial process such as hemorrhage or infarction. No mass lesions. The brainstem and cerebellum are grossly normal. Vascular: Minimal scattered vascular calcifications. No aneurysm or hyperdense vessels. Skull: No skull fracture or bone lesions. Sinuses/Orbits: The paranasal sinuses and mastoid air cells are clear. The globes are intact. Other: No scalp lesion or hematoma. IMPRESSION: Normal and stable head CT. Electronically Signed   By: Marijo Sanes M.D.   On: 11/24/2015 15:56   Ct Head Wo Contrast  Result Date: 11/13/2015 CLINICAL DATA:  Nausea, vomiting, and diarrhea for 2 weeks. Near syncope. EXAM: CT HEAD WITHOUT CONTRAST TECHNIQUE: Contiguous axial images were obtained from the base of the skull through the vertex without intravenous contrast. COMPARISON:  None. FINDINGS: Brain: No subdural, epidural, or subarachnoid hemorrhage. Ventricles and sulci are normal. Cerebellum, brainstem, and basal cisterns are normal. No acute cortical ischemia or infarct. Vascular: Calcified atherosclerosis is seen in the intracranial portions  of the carotid arteries. Skull: Normal. Negative for fracture or focal lesion. Sinuses/Orbits: No acute finding. Other: No other abnormalities. IMPRESSION: No acute intracranial abnormalities. Electronically Signed   By: Dorise Bullion III M.D   On: 11/13/2015 11:56   Ct Soft Tissue Neck W Contrast  Result Date: 12/06/2015 CLINICAL DATA:  Sore throat, difficulty swallowing for 3 days after endoscopy. EXAM: CT NECK WITH CONTRAST TECHNIQUE: Multidetector CT imaging of the neck was performed using the standard protocol following the bolus administration of intravenous contrast. CONTRAST:  79mL ISOVUE-300 IOPAMIDOL (ISOVUE-300) INJECTION 61% COMPARISON:  Chest radiograph November 30, 2015 and CT HEAD November 24, 2015 and CT chest August 27, 2015 FINDINGS: Pharynx and larynx: Secretions/debris in LEFT piriform sinus. Sub cm RIGHT laryngocele apposition of the true vocal cords most compatible with phonation, however true vocal cords appeared mildly thickened. Airway is patent. Elongated RIGHT superior cornu thyroid cartilage. Salivary glands: 6 x 12 mm RIGHT sublingual gland sialolith without sialoadenitis. Major salivary glands are otherwise unremarkable. Thyroid: Normal. Lymph nodes: 14 mm LEFT supraclavicular lymph node versus venous structure. Vascular: Mild calcific atherosclerosis of the carotid bifurcations. Limited intracranial: Normal. Visualized orbits: Normal. Mastoids and visualized paranasal sinuses: Well aerated. Skeleton: Patient is edentulous. Status post median sternotomy.  No destructive bony lesions. Cold LEFT clavicle fracture. Upper chest: LEFT lung volume loss with pleural effusion and prominent pulmonary vessels. LEFT calcified pleural plaques. Re- demonstration of ascending aortic aneurysm, mild cardiomegaly and coronary artery calcifications. Other: None. IMPRESSION: Apposed vocal cords, most compatible with phonation though, there could be a component of laryngitis. Secretions in LEFT piriform  sinus. 6 x 12 mm RIGHT sublingual gland sialolith without sialoadenitis. Persistent LEFT lung atelectasis, with pleural effusion. 14 mm LEFT supraclavicular lymph node versus venous structures. Recommend follow-up CT of the chest with contrast on a nonemergent basis for further characterization. Electronically Signed   By: Elon Alas M.D.   On: 12/06/2015 02:24   Mr Jeri Cos [id number] Contrast  Result Date: 12/06/2015 CLINICAL DATA:  Intractable nausea and vomiting. Difficulty swallowing and sore throat after recent endoscopy. History of hypertension, hyperlipidemia, and diabetes. EXAM: MRI HEAD WITHOUT AND WITH CONTRAST TECHNIQUE: Multiplanar, multiecho pulse sequences of the brain and surrounding structures were obtained without and with intravenous contrast. CONTRAST:  68mL MULTIHANCE GADOBENATE DIMEGLUMINE 529 MG/ML IV SOLN COMPARISON:  Head CT 11/24/2015 FINDINGS: Brain: There is no evidence of acute infarct, mass, midline shift, or extra-axial fluid collection. Chronic microhemorrhages are present in the left occipital lobe and left temporal lobe. Ventricles and sulci are normal in size. There are numerous small foci of T2 hyperintensity in the subcortical and deep cerebral white matter bilaterally which are mildly to moderately advanced for age. Some of these demonstrate mild T2 shine through on diffusion weighted imaging. The brainstem is normal in appearance. No abnormal enhancement is identified. Vascular: Major intracranial vascular flow voids are preserved. Skull and upper cervical spine: Unremarkable bone marrow signal. Sinuses/Orbits: Unremarkable orbits. Paranasal sinuses and mastoid air cells are clear. Other: None. IMPRESSION: 1. No acute intracranial abnormality or mass. 2. Mild-to-moderate cerebral white matter T2 signal changes, nonspecific but compatible with chronic small vessel ischemic disease. Electronically Signed   By: Logan Bores M.D.   On: 12/06/2015 19:29   Nm Hepatobiliary Liver  Func  Result Date: 12/07/2015 CLINICAL DATA:  Nausea and vomiting. EXAM: NUCLEAR MEDICINE HEPATOBILIARY IMAGING TECHNIQUE: Sequential images of the abdomen were obtained out to 60 minutes following intravenous administration of radiopharmaceutical. RADIOPHARMACEUTICALS:  5.0 mCi Tc-81m  Choletec IV COMPARISON:  CT scan 12/01/2015 FINDINGS: Symmetric uptake in the liver and prompt excretion into the biliary tree which is visualized by 10 minutes. The gallbladder is visualized at 15 minutes. Small bowel activity is noted at 40 minutes. IMPRESSION: Normal biliary patency study. Electronically Signed   By: Marijo Sanes M.D.   On: 12/07/2015 14:18   Nm Gastric Emptying  Result Date: 12/03/2015 CLINICAL DATA:  Diabetic with nausea and vomiting post CABG last month. EXAM: NUCLEAR MEDICINE GASTRIC EMPTYING SCAN TECHNIQUE: After oral ingestion of radiolabeled meal, sequential abdominal images were obtained for 4 hours. Percentage of activity emptying the stomach was calculated at 1 hour, 2 hour, 3 hour, and 4 hours. RADIOPHARMACEUTICALS:  2.0 mCi Tc-82m sulfur colloid in standardized meal COMPARISON:  Abdominal CT 12/01/2015. FINDINGS: Expected location of the stomach in the left upper quadrant. Ingested meal empties the stomach gradually over the course of the study. 20.4% emptied at 1 hr ( normal >= 10%) 40.4% emptied at 2 hr ( normal >= 40%) 92.2% emptied at 3 hr ( normal >= 70%) Study was terminated due to normal emptying at 3 hours. No 4 hour and teen calculated. IMPRESSION: Normal solid gastric emptying study. Electronically Signed   By: Gwyndolyn Saxon  Lin Landsman M.D.   On: 12/03/2015 11:49   Dg Chest Port 1 View  Result Date: 11/24/2015 CLINICAL DATA:  Fever and atrial fibrillation EXAM: PORTABLE CHEST 1 VIEW COMPARISON:  November 14, 2015 FINDINGS: There is airspace consolidation in the left lower lobe consistent with pneumonia. There is a small associated left pleural effusion. The lungs elsewhere are clear. Heart  is upper normal in size with pulmonary vascularity within normal limits. The patient is status post aortic valve replacement. The patient is also status post coronary artery bypass grafting. No adenopathy. There is evidence of old healed fracture of the left clavicle. IMPRESSION: Lobe airspace consolidation consistent with pneumonia. Small left pleural effusion. Lungs elsewhere clear. Stable cardiac silhouette. Followup PA and lateral chest radiographs recommended in 3-4 weeks following trial of antibiotic therapy to ensure resolution and exclude underlying malignancy. Electronically Signed   By: Lowella Grip III M.D.   On: 11/24/2015 14:00       Subjective: Feeling well, walk in the hall, no lightheaded.  No nausea. Eating ok.  He is feeling much better after sx.   Discharge Exam: Vitals:   12/11/15 0305 12/11/15 0510  BP: (!) 117/57 121/63  Pulse: 78 76  Resp: 17 17  Temp: 98.4 F (36.9 C) 98.1 F (36.7 C)   Vitals:   12/10/15 1752 12/10/15 2059 12/11/15 0305 12/11/15 0510  BP: 116/61 110/60 (!) 117/57 121/63  Pulse: 81 83 78 76  Resp: 16 16 17 17   Temp: 98.5 F (36.9 C) 98.5 F (36.9 C) 98.4 F (36.9 C) 98.1 F (36.7 C)  TempSrc: Oral Oral Oral Oral  SpO2: 97% 98% 98% 100%  Weight:      Height:        General: Pt is alert, awake, not in acute distress Cardiovascular: RRR, S1/S2 +, no rubs, no gallops Respiratory: CTA bilaterally, no wheezing, no rhonchi Abdominal: Soft, NT, ND, bowel sounds + Extremities: no edema, no cyanosis    The results of significant diagnostics from this hospitalization (including imaging, microbiology, ancillary and laboratory) are listed below for reference.     Microbiology: Recent Results (from the past 240 hour(s))  Rapid strep screen (not at [facility])     Status: Abnormal   Collection Time: 12/06/15  9:20 AM  Result Value Ref Range Status   Streptococcus, Group A Screen (Direct) POSITIVE (A) NEGATIVE Final  MRSA PCR Screening      Status: None   Collection Time: 12/06/15 10:23 AM  Result Value Ref Range Status   MRSA by PCR NEGATIVE NEGATIVE Final    Comment:        The GeneXpert MRSA Assay (FDA approved for NASAL specimens only), is one component of a comprehensive MRSA colonization surveillance program. It is not intended to diagnose MRSA infection nor to guide or monitor treatment for MRSA infections.   Surgical pcr screen     Status: None   Collection Time: 12/10/15  5:27 AM  Result Value Ref Range Status   MRSA, PCR NEGATIVE NEGATIVE Final   Staphylococcus aureus NEGATIVE NEGATIVE Final    Comment:        The Xpert SA Assay (FDA approved for NASAL specimens in patients over 27 years of age), is one component of a comprehensive surveillance program.  Test performance has been validated by [facility] for patients greater than or equal to 79 year old. It is not intended to diagnose infection nor to guide or monitor treatment.      Labs: BNP (last  3 results) No results for input(s): BNP in the last 8760 hours. Basic Metabolic Panel:  Recent Labs Lab 12/06/15 0028 12/06/15 0440 12/08/15 1509 12/09/15 0428 12/10/15 0549  NA 136 135 137 136 136  K 4.1 4.4 3.6 3.5 3.5  CL 99* 99* 99* 98* 99*  CO2  --  29 29 29 29   GLUCOSE 174* 180* 144* 113* 155*  BUN 9 7 6  <5* 8  CREATININE 1.10 1.16 1.03 0.98 1.05  CALCIUM  --  9.3 9.7 9.4 9.5   Liver Function Tests: No results for input(s): AST, ALT, ALKPHOS, BILITOT, PROT, ALBUMIN in the last 168 hours. No results for input(s): LIPASE, AMYLASE in the last 168 hours. No results for input(s): AMMONIA in the last 168 hours. CBC:  Recent Labs Lab 12/06/15 0022 12/06/15 0028 12/06/15 0440 12/08/15 1509 12/09/15 0428 12/10/15 0549  WBC 10.9*  --  9.4 8.0 7.2 8.7  NEUTROABS 7.9*  --   --   --   --   --   HGB 10.9* 11.9* 10.4* 10.6* 10.6* 11.1*  HCT 32.8* 35.0* 32.3* 32.4* 32.5* 34.2*  MCV 86.1  --  86.8 86.9 86.7 87.0  PLT 276  --  287 304  306 323   Cardiac Enzymes: No results for input(s): CKTOTAL, CKMB, CKMBINDEX, TROPONINI in the last 168 hours. BNP: Invalid input(s): POCBNP CBG:  Recent Labs Lab 12/10/15 0545 12/10/15 0826 12/10/15 1202 12/10/15 1606 12/10/15 2104  GLUCAP 150* 122* 144* 232* 169*   D-Dimer No results for input(s): DDIMER in the last 72 hours. Hgb A1c No results for input(s): HGBA1C in the last 72 hours. Lipid Profile No results for input(s): CHOL, HDL, LDLCALC, TRIG, CHOLHDL, LDLDIRECT in the last 72 hours. Thyroid function studies No results for input(s): TSH, T4TOTAL, T3FREE, THYROIDAB in the last 72 hours.  Invalid input(s): FREET3 Anemia work up No results for input(s): VITAMINB12, FOLATE, FERRITIN, TIBC, IRON, RETICCTPCT in the last 72 hours. Urinalysis    Component Value Date/Time   COLORURINE YELLOW 11/30/2015 1250   APPEARANCEUR HAZY (A) 11/30/2015 1250   LABSPEC 1.011 11/30/2015 1250   PHURINE 6.5 11/30/2015 1250   GLUCOSEU NEGATIVE 11/30/2015 1250   GLUCOSEU NEGATIVE 04/06/2010 1554   HGBUR TRACE (A) 11/30/2015 1250   BILIRUBINUR NEGATIVE 11/30/2015 1250   BILIRUBINUR Negative 04/03/2014 1642   KETONESUR NEGATIVE 11/30/2015 1250   PROTEINUR 30 (A) 11/30/2015 1250   UROBILINOGEN 1.0 04/03/2014 1642   UROBILINOGEN 1.0 04/03/2014 0058   NITRITE NEGATIVE 11/30/2015 1250   LEUKOCYTESUR LARGE (A) 11/30/2015 1250   Sepsis Labs Invalid input(s): PROCALCITONIN,  WBC,  LACTICIDVEN Microbiology Recent Results (from the past 240 hour(s))  Rapid strep screen (not at [facility])     Status: Abnormal   Collection Time: 12/06/15  9:20 AM  Result Value Ref Range Status   Streptococcus, Group A Screen (Direct) POSITIVE (A) NEGATIVE Final  MRSA PCR Screening     Status: None   Collection Time: 12/06/15 10:23 AM  Result Value Ref Range Status   MRSA by PCR NEGATIVE NEGATIVE Final    Comment:        The GeneXpert MRSA Assay (FDA approved for NASAL specimens only), is one component of  a comprehensive MRSA colonization surveillance program. It is not intended to diagnose MRSA infection nor to guide or monitor treatment for MRSA infections.   Surgical pcr screen     Status: None   Collection Time: 12/10/15  5:27 AM  Result Value Ref Range Status  MRSA, PCR NEGATIVE NEGATIVE Final   Staphylococcus aureus NEGATIVE NEGATIVE Final    Comment:        The Xpert SA Assay (FDA approved for NASAL specimens in patients over 7 years of age), is one component of a comprehensive surveillance program.  Test performance has been validated by [facility] for patients greater than or equal to 22 year old. It is not intended to diagnose infection nor to guide or monitor treatment.      Time coordinating discharge: Over 30 minutes  SIGNED:   Elmarie Shiley, MD  [facility] 12/11/2015, 11:11 AM Pager [phone]  If 7PM-7AM, please contact night-coverage [email or web address] Password [email or web address]

## 2015-12-11 NOTE — Progress Notes (Signed)
Discharge home. Home discharge instruction given, no question verbalized. 

## 2015-12-11 NOTE — Care Management Note (Addendum)
Case Management Note  Patient Details  Name: [name] MRN: [id number] Date of Birth: 1945/07/31  Subjective/Objective:                  Uvular swelling  Action/Plan: Discharge planning Expected Discharge Date:  12/11/15               Expected Discharge Plan:  [place]  In-House Referral:     Discharge planning Services  CM Consult  Post Acute Care Choice:  Home Health Choice offered to:  Patient  DME Arranged:    DME Agency:     HH Arranged:  PT HH Agency:  [facility]  Status of Service:  Completed, signed off  If discussed at [place] of Stay Meetings, dates discussed:    Additional Comments: Pt is active with Al[facility]mings and CM called [facility] to notify of discharge and has faxed HHPT/RN orders to [facility] for resumption of care.  No other CM needs were communicated. Dellie Catholic, RN 12/11/2015, 3:02 PM

## 2015-12-13 ENCOUNTER — Encounter (HOSPITAL_COMMUNITY): Payer: Self-pay | Admitting: Otolaryngology

## 2015-12-13 ENCOUNTER — Telehealth: Payer: Self-pay

## 2015-12-13 NOTE — Telephone Encounter (Signed)
D/C: 12/11/15 To: Home Pt scheduled with [name] 12/17/15. Pt aware.   Transition Care Management Follow-up Telephone Call  How have you been since you were released from the hospital? Throat sore, good   Do you understand why you were in the hospital? yes   Do you understand the discharge instrcutions? yes  Items Reviewed:  Medications reviewed: yes  Allergies reviewed: yes  Dietary changes reviewed: yes  Referrals reviewed: yes   Functional Questionnaire:   Activities of Daily Living (ADLs):   He states they are independent in the following: ambulation, bathing and hygiene, feeding, continence, grooming, toileting and dressing States they require assistance with the following: none   Any transportation issues/concerns?: yes   Any patient concerns? no   Confirmed importance and date/time of follow-up visits scheduled: yes   Confirmed with patient if condition begins to worsen call PCP or go to the ER.  Patient was given the Call-a-Nurse line [phone]: yes

## 2015-12-16 ENCOUNTER — Ambulatory Visit (INDEPENDENT_AMBULATORY_CARE_PROVIDER_SITE_OTHER): Payer: Medicare Other | Admitting: Adult Health

## 2015-12-16 ENCOUNTER — Encounter: Payer: Self-pay | Admitting: Adult Health

## 2015-12-16 VITALS — BP 122/64 | Temp 98.6°F | Ht 72.0 in | Wt 174.2 lb

## 2015-12-16 DIAGNOSIS — I480 Paroxysmal atrial fibrillation: Secondary | ICD-10-CM | POA: Diagnosis not present

## 2015-12-16 DIAGNOSIS — K1379 Other lesions of oral mucosa: Secondary | ICD-10-CM

## 2015-12-16 DIAGNOSIS — E1140 Type 2 diabetes mellitus with diabetic neuropathy, unspecified: Secondary | ICD-10-CM

## 2015-12-16 DIAGNOSIS — Z794 Long term (current) use of insulin: Secondary | ICD-10-CM

## 2015-12-16 DIAGNOSIS — IMO0002 Reserved for concepts with insufficient information to code with codable children: Secondary | ICD-10-CM

## 2015-12-16 DIAGNOSIS — E1165 Type 2 diabetes mellitus with hyperglycemia: Secondary | ICD-10-CM

## 2015-12-16 DIAGNOSIS — R112 Nausea with vomiting, unspecified: Secondary | ICD-10-CM | POA: Diagnosis not present

## 2015-12-16 DIAGNOSIS — Z951 Presence of aortocoronary bypass graft: Secondary | ICD-10-CM

## 2015-12-16 LAB — BASIC METABOLIC PANEL
BUN: 12 mg/dL (ref 6–23)
CHLORIDE: 99 meq/L (ref 96–112)
CO2: 28 mEq/L (ref 19–32)
Calcium: 10.2 mg/dL (ref 8.4–10.5)
Creatinine, Ser: 1.12 mg/dL (ref 0.40–1.50)
GFR: 83.74 mL/min (ref >60.00)
Glucose, Bld: 207 mg/dL — ABNORMAL HIGH (ref 70–99)
POTASSIUM: 4.4 meq/L (ref 3.5–5.1)
Sodium: 137 mEq/L (ref 135–145)

## 2015-12-16 LAB — CBC
HCT: 35 % — ABNORMAL LOW (ref 39.0–52.0)
Hemoglobin: 11.6 g/dL — ABNORMAL LOW (ref 13.0–17.0)
MCHC: 33.1 g/dL (ref 30.0–36.0)
MCV: 87.3 fl (ref 78.0–100.0)
PLATELETS: 312 10*3/uL (ref 150.0–400.0)
RBC: 4.01 Mil/uL — AB (ref 4.22–5.81)
RDW: 16.5 % — ABNORMAL HIGH (ref 11.5–15.5)
WBC: 10.3 10*3/uL (ref 4.0–10.5)

## 2015-12-16 NOTE — Progress Notes (Deleted)
   Subjective:    Patient ID: [name], male    DOB: March 04, 1945, 70 y.o.   MRN: [id number]  HPI  Lab Results  Component Value Date   HGBA1C 11.2 (H) 10/29/2015     Review of Systems     Objective:   Physical Exam        Assessment & Plan:

## 2015-12-16 NOTE — Patient Instructions (Signed)
It was great seeing you today! And I am glad you are recuperating well from the hospital visits.   Please increase Lantus by 12 units to 15 units.   Write your readings down in a log and bring to your next appointment.   Please follow up with me in 2 weeks for an establish care visit.   Let me know if you need anything in the meantime.   I will follow up with you regarding your labs

## 2015-12-16 NOTE — Progress Notes (Signed)
Subjective:    Patient ID: [name], male    DOB: [birth year], 70 y.o.   MRN: [id number]  HPI  [name] is a 70 year old African-American male who  has a past medical history of Aortic stenosis; Carpal tunnel syndrome (09/24/2014); Coronary artery disease; Diabetes mellitus, type 2 ([place]); Erectile dysfunction; Essential hypertension; and Hyperlipidemia. He presents to the office today for hospital follow-up. He has had a complicated hospital course over the last few months.   September 2017  He was admitted on 10/27/2015 and discharged 11/08/2015. Per hospital note:   Dr. Servando Snare saw the patient in July of this year for an ascending aortic aneurysm. CT scan obtained at that time measured the aneurysm to be 4.7 cm which was stable from previous scan obtained in 2013. At that time, it was recommended the patient undergo close follow up with serial CT scans. The patient has been experiencing chest pain more frequently. He therefore had a stress test by Dr. Acie Fredrickson last week with was positive and he was scheduled for elective cardiac catheterization. This was done today and showed severe CAD. It was felt this combined with his aortic stenosis and aortic aneurysm he would benefit from coronary bypass grafting and TCTS consult was obtained.  Procedure (s):  1. Coronary artery bypass grafting x4 with left internal mammary to the left anterior descending coronary artery, sequential reverse saphenous vein graft to the first and second obtuse marginal, reverse saphenous vein graft to the distal right coronary artery with right vein greater saphenous endo vein harvesting. 2. Replacement of aortic valve with pericardial tissue valve [employer], model 3300 TFX 23 mm, serial [id number] supra coronary replacement of ascending aorta with 34-mm Hemashield graft,  by Dr. Servando Snare on 10/29/2015.   October 2017  He was again admitted on 11/13/2015 and discharged  11/20/2015. Per hospital note:  70 year old male with PMH of aortic stenosis, CAD, type II DM, essential hypertension, hyperlipidemia, recently discharged from the hospital on 11/08/15 status post 4 vessel CABG and AVR ([employer] pericardial tissue valve) for management of CAD and aortic stenosis. Postoperative course was complicated by PAF which was treated with amiodarone and Xarelto. He was now readmitted with 2-3 days history of nausea, nonbloody emesis, diarrhea, orthostatic hypotension, hematuria and near syncope.  Near syncope, likely related to hypotension/orthostatic hypotension - Due to several days' history of nausea, vomiting and diarrhea + medications & ? Related to recent surgery - Patient was adequately hydrated but continued to be orthostatic. After discussing with cardiology and thoracic surgery, midodrine was initiated and titrated up to 10 mg 3 times daily. ARB, diuretics and beta blockers have been held. Thigh high TED hose was placed. - With these measures, orthostatics may be slightly better but symptomatically patient is doing much better and is able to ambulate only with minimal dizziness or lightheadedness at times. No sensation of feeling like he cannot pass out. - He has been repeatedly counseled regarding measures to minimize her symptoms from orthostatic hypotension: Gradual transition from supine to sitting to standing and ambulating, adequate hydration, increasing salt intake, elevating legs up when he is sitting, where TED hose when ambulating. He verbalizes understanding. - Patient has been instructed that he should not drive for at least 6 months. He says these instructions are in place from recent cardiac surgery.  Nausea, vomiting and diarrhea - Unclear etiology.? Acute viral GE. GI pathogen panel PCR and C. difficile testing negative. Empirically started IV metronidazole was discontinued on 10/4. KUB without obstruction. -  Resolved.   Klebsiella  pneumoniae UTI/hematuria - Patient had terminal dysuria and hematuria on admission. Patient transitioned from IV ceftriaxone to oral Keflex. Completed total 7 days treatment. - Patient complains of "tea-colored" urine and urine microscopy still had hematuria.  - Repeat urine microscopy 10/4 shows yeast-infectious disease input appreciated and no need to treat. - As per patient's report, gross hematuria has resolved. Recommend outpatient follow-up with repeat urine microscopy and if he has persistent microscopic hematuria, we will need outpatient evaluation and maybe urology consultation.   Additionally he was admitted on 11/30/2015 and discharged on 12/03/2015 for recurring vomiting. CT of the abdomen show no evidence of GI obstruction he underwent endoscopy without any findings that would account for his recurrent vomiting. He underwent a gastric emptying study that was negative as well  Finally he was admitted on 12/05/2015 and discharged 12/11/2015 after he was seen in the ER for a sore throat that had become progressively worse over the previous 3 days. Patient had had dysphagia and odynophagia since his EGD procedure procedure. He reported spitting up blood and vomited 10 times the day prior to presenting to the emergency room.  CT of soft tissue of neck on 12/06/15 shows apposed vocal cords, most compatible with phonation though, there could be a component of laryngitis, 6 x 12 mm RIGHT sublingual gland sialolith without sialoadenitis, and persistent LEFT lung atelectasis, with pleural effusion.   Per discharge summary   Nausea/vomiting and orthostatic hypotension - Patient has been having intractable nausea and vomiting and orthostatic hypotension ever since he had his CABG couple of months ago. He was feeling fine prior to that. He was worked up extensively in the past without any clear answers, and patient is continuing to be symptomatic - he was worked up for adrenal insufficiency with a  normal ACTH stimulation test  - GI was consulted and have seen the patient last hospitalization, patient underwent an EGD which was unremarkable  - he also had Gastric emptying study which was unremarkable as well  -HIDA scan negative - Continue zofran 4mg  prn - Continue pepcid 20mg  Q12H -One consideration was that Amidoarone could cause this symptoms. Dr Cruzita Lederer Discussed with Dr. Acie Fredrickson, and he agrees with holding amiodarone now that patient is 2 months post op. Reccomends low dose BB. Patient was started on Metoprolol 12.5 and monitor on telemetry. If his symptoms are amiodarone induced, his orthostasis should resolve when Amio is out of his system (may be few weeks). He is not hypotensive and his BP is in the 130-140s currently, continue to hold Midodrine.  PT evaluation.  symptoms has improved.   Uvular swelling/dysphagia, Pyogenic granuloma.  - EGD performed 12/02/15 ?whether this caused local irritation - Rapid strep was positive, s/p treatment with single im Penicillin dose - d/w Dr. Erik Obey with ENT on [id number], recommended observation and reflux treatment, he reviewed the CT - patient still complaining of difficulty with swallowing, throat scratching, feels uvula cause obstruction, amd make him cough and vomit.  -Appreciate Dr Erik Obey help. 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  patient feeling better, no significant nausea, no further vomiting.   Today in the office he reports that he is feeling much better since being discharged from the hospital. He continues to have a feeling of muscle soreness in the left chest from cardiac surgery in September. He is not having any pain at site of amputation of the uvula tip. He is currently going to physical therapy and using a walker to ambulate. He  reports losing approximately 30 pounds throughout all 3 of these hospital stays.  He continues to have episodes of dizziness especially after standing for an extended period time. He reports that  although he continues to have this dizziness is much improved since amiodarone was stopped the hospital  He has an appointment with cardiology the first week of December  He is monitoring his sugars at home morning and night he reports that this morning his blood sugar was 147 when he awoke. Last night right before bed his blood sugar was in the 150s. He takes his diabetes medication daily.  He denies any chest pain, shortness of breath, fevers, nausea, vomiting, or diarrhea.  He has been eating and endorses improving his diet to a healthier diet than what is been accustomed to in the past. His last A1c approximately one month ago was 11.2. This was a jump from 7.2- 11 months prior.   Review of Systems  Constitutional: Positive for activity change, appetite change and fatigue. Negative for chills, diaphoresis, fever and unexpected weight change.  HENT: Negative.   Eyes: Negative.   Respiratory: Negative.   Cardiovascular: Negative.   Gastrointestinal: Negative.   Endocrine: Negative.   Genitourinary: Negative.   Musculoskeletal: Positive for gait problem.  Skin: Negative.   Neurological: Positive for dizziness.  Hematological: Negative.   Psychiatric/Behavioral: Negative.   All other systems reviewed and are negative.  Past Medical History:  Diagnosis Date  . 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 Carpal tunnel syndrome 09/24/2014   Bilateral  . 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 Diabetes mellitus, type 2 ([place])   . Erectile dysfunction   . Essential hypertension   . Hyperlipidemia     Social History   Social History  . Marital status: Married    Spouse name: N/A  . Number of children: N/A  . Years of education: N/A   Occupational History  . Not on file.   Social History Main Topics  . Smoking status: Never Smoker  . Smokeless tobacco: Never Used  . Alcohol use No  . Drug use: No  . Sexual  activity: Yes   Other Topics Concern  . Not on file   Social History Narrative   Married to [name] for 82 years   Retired as Chiropodist   5 children   Never Smoked   Alcohol use- no   Caffeine use: occasional     Past Surgical History:  Procedure Laterality Date  . AORTIC VALVE REPLACEMENT N/A 10/29/2015   Procedure: AORTIC VALVE REPLACEMENT (AVR) WITH 23MM MAGNA EASE TISSUE VALVE.;  Surgeon: [name], MD;  Location: [place];  Service: Open Heart Surgery;  Laterality: N/A;  . CARDIAC CATHETERIZATION N/A 10/27/2015   Procedure: Left Heart Cath and Coronary Angiography;  Surgeon: [name], MD;  Location: [place] [facility];  Service: Cardiovascular;  Laterality: N/A;  . CORONARY ARTERY BYPASS GRAFT N/A 10/29/2015   Procedure: CORONARY ARTERY BYPASS GRAFTING (CABG) x Four UTILIZING THE LEFT INTERNAL MAMMARY ARTERY AND ENDOSCOPICALLY HARVESTED RIGHT SAPEHENEOUS VEINS.;  Surgeon: [name], MD;  Location: [place];  Service: Open Heart Surgery;  Laterality: N/A;  . CYST REMOVAL HAND Right   . ESOPHAGOGASTRODUODENOSCOPY N/A 12/02/2015   Procedure: ESOPHAGOGASTRODUODENOSCOPY (EGD);  Surgeon: [name], MD;  Location: [place];  Service: Gastroenterology;  Laterality: N/A;  . REPLACEMENT ASCENDING AORTA N/A 10/29/2015  Procedure: REPLACEMENT OF ASCENDING AORTA USING 34MM X 30CM WOVEN DOUBLE VELOUR VASCULAR GRAFT.;  Surgeon: [name], MD;  Location: [place];  Service: Open Heart Surgery;  Laterality: N/A;  . TEE WITHOUT CARDIOVERSION N/A 10/29/2015   Procedure: TRANSESOPHAGEAL ECHOCARDIOGRAM (TEE);  Surgeon: [name], MD;  Location: [place];  Service: Open Heart Surgery;  Laterality: N/A;  . UVULECTOMY N/A 12/10/2015   Procedure: UVULECTOMY;  Surgeon: [name], MD;  Location[facility] OR;  Service: ENT;  Laterality: N/A;    Family History  Problem Relation Age of Onset  . Cancer Father     Throat cancer  . Diabetes Sister      Allergies  Allergen Reactions  . No Known Allergies     Current Outpatient Prescriptions on File Prior to Visit  Medication Sig Dispense Refill  . atorvastatin (LIPITOR) 40 MG tablet Take 1 tablet (40 mg total) by mouth daily. 30 tablet 11  . B-D ULTRAFINE III SHORT PEN 31G X 8 MM MISC USE 4 TIMES DAILY AS DIRECTED 100 each 0  . famotidine (PEPCID) 20 MG tablet Take 1 tablet (20 mg total) by mouth 2 (two) times daily. 30 tablet 0  . ferrous gluconate (FERGON) 324 MG tablet Take 1 tablet (324 mg total) by mouth 2 (two) times daily with a meal. 60 tablet 1  . folic acid (FOLVITE) 1 MG tablet Take 1 tablet (1 mg total) by mouth daily. 30 tablet 1  . glucose blood test strip Use as instructed 100 each 12  . Insulin Glargine (LANTUS SOLOSTAR) 100 UNIT/ML Solostar Pen Inject 15 Units into the skin daily at 10 pm. 5 pen 5  . metFORMIN (GLUCOPHAGE) 1000 MG tablet Take 1 tablet (1,000 mg total) by mouth 2 (two) times daily with a meal. 180 tablet 1  . metoprolol tartrate (LOPRESSOR) 25 MG tablet Take 0.5 tablets (12.5 mg total) by mouth 2 (two) times daily. 60 tablet 0  . ondansetron (ZOFRAN ODT) 4 MG disintegrating tablet Take 1 tablet (4 mg total) by mouth every 8 (eight) hours as needed for nausea or vomiting. 30 tablet 0  . rivaroxaban (XARELTO) 20 MG TABS tablet Take 1 tablet (20 mg total) by mouth daily with supper. 30 tablet 0  . senna-docusate (SENOKOT-S) 8.6-50 MG tablet Take 1 tablet by mouth 2 (two) times daily. 30 tablet 0   No current facility-administered medications on file prior to visit.     BP 122/64   Temp 98.6 F (37 C) (Oral)   Ht 6' (1.829 m)   Wt 174 lb 3.2 oz (79 kg)   BMI 23.63 kg/m       Objective:   Physical Exam  Constitutional: He is oriented to person, place, and time. He appears well-developed and well-nourished. No distress.  Eyes: EOM are normal. Pupils are equal, round, and reactive to light. Right eye exhibits no discharge. Left eye exhibits no  discharge.  Neck: Normal range of motion. Neck supple.  Cardiovascular: Normal rate, regular rhythm and intact distal pulses.  Exam reveals no gallop and no friction rub.   No murmur heard. Pulmonary/Chest: Effort normal and breath sounds normal. No respiratory distress. He has no wheezes. He has no rales. He exhibits no tenderness.  Abdominal: Soft. Bowel sounds are normal. He exhibits no distension and no mass. There is no tenderness. There is no rebound and no guarding.  Musculoskeletal: He exhibits no edema, tenderness or deformity.  Seen a walker to ambulate. He has a slow  but steady gait  Lymphadenopathy:    He has no cervical adenopathy.  Neurological: He is alert and oriented to person, place, and time. He has normal reflexes. He displays normal reflexes. No cranial nerve deficit. He exhibits normal muscle tone.  Skin: Skin is warm and dry. No rash noted. He is not diaphoretic. No erythema. No pallor.  No signs of infection at area of uvula amputation. Does have well-healed surgical scar on chest wall  Psychiatric: He has a normal mood and affect. His behavior is normal. Judgment and thought content normal.  Nursing note and vitals reviewed.     Assessment & Plan:  1. Uncontrolled type 2 diabetes mellitus with diabetic neuropathy, with long-term current use of insulin ([place]) - Will have him keep a log of his blood sugars - Basic metabolic panel; Future - CBC - Basic metabolic panel - Follow-up in 2 weeks for an establish care visit. At that time we will retest A1c  2. Paroxysmal atrial fibrillation (H[facility]) - Sinus rhythm in the office today - Basic metabolic panel; Future - CBC - Basic metabolic panel  3. Uvular swelling - Surgical site appears to be healing well. Advised patient to keep an eye on this area he has any signs of infection please follow-up with me or general surgery - Basic metabolic panel; Future - CBC - Basic metabolic panel  4. Nausea and vomiting,  intractability of vomiting not specified, unspecified vomiting type - Seems to have resolved - Basic metabolic panel; Future - CBC - Basic metabolic panel  5. Hx of CABG (Sept 2017) - Follow-up with cardiology in December 2017. If needed call for earlier appointment - Basic metabolic panel; Future - CBC - Basic metabolic panel  [name], NP

## 2015-12-17 ENCOUNTER — Other Ambulatory Visit: Payer: Self-pay | Admitting: Cardiothoracic Surgery

## 2015-12-17 DIAGNOSIS — Z951 Presence of aortocoronary bypass graft: Secondary | ICD-10-CM

## 2015-12-20 ENCOUNTER — Ambulatory Visit (INDEPENDENT_AMBULATORY_CARE_PROVIDER_SITE_OTHER): Payer: Self-pay | Admitting: Cardiothoracic Surgery

## 2015-12-20 ENCOUNTER — Ambulatory Visit
Admission: RE | Admit: 2015-12-20 | Discharge: 2015-12-20 | Disposition: A | Discharge status: Home / Self Care | Payer: Medicare Other | Source: Ambulatory Visit | Attending: Cardiothoracic Surgery | Admitting: Cardiothoracic Surgery

## 2015-12-20 ENCOUNTER — Encounter: Payer: Self-pay | Admitting: Cardiothoracic Surgery

## 2015-12-20 VITALS — BP 104/66 | HR 100 | Resp 20 | Ht 72.0 in | Wt 174.0 lb

## 2015-12-20 DIAGNOSIS — Z95828 Presence of other vascular implants and grafts: Secondary | ICD-10-CM

## 2015-12-20 DIAGNOSIS — Z951 Presence of aortocoronary bypass graft: Secondary | ICD-10-CM

## 2015-12-20 DIAGNOSIS — Z952 Presence of prosthetic heart valve: Secondary | ICD-10-CM

## 2015-12-20 NOTE — Progress Notes (Signed)
[address]       [place] [address]             [phone]      Alby R Padovano [facility] Medical Record [id number] Date of Birth: 01-Aug-1945  Referring: [name], [name], MD Primary Care: [name], NP  Chief Complaint:   POST OP FOLLOW UP 10/29/2015   OPERATIVE REPORT  PREOPERATIVE DIAGNOSES:  Severe 3-vessel coronary occlusive disease. Bicuspid aortic valve with moderate to severe aortic stenosis.  Dilated ascending aorta 4.7 cm. PROCEDURE PERFORMED: 1. Coronary artery bypass grafting x4 with left internal mammary to     the left anterior descending coronary artery, sequential reverse     saphenous vein graft to the first and second obtuse marginal,     reverse saphenous vein graft to the distal right coronary artery     with right vein greater saphenous endo vein harvesting. 2. Replacement of aortic valve with pericardial tissue valve Edwards     [employer], model 3300 TFX 23 mm, serial [id number] supra coronary     replacement of ascending aorta with 34-mm Hemashield graft,     Wheat procedure. SURGEON:  [name], MD.  History of Present Illness:     The patient seems to be making good progress postoperatively now, early postoperatively had trouble with nausea vomiting. Ultimately underwent endoscopy and also ENT procedure. He continues to have physical therapy come to his house but is markedly improving. He should be able to start in outpatient rehabilitation in the near future  He said no evidence of congestive heart failure or angina.     Past Surgical History:  Procedure Laterality Date  . AORTIC VALVE REPLACEMENT N/A 10/29/2015   Procedure: AORTIC VALVE REPLACEMENT (AVR) WITH 23MM MAGNA EASE TISSUE VALVE.;  Surgeon: [name], MD;  Location: [facility];  Service: Open Heart Surgery;  Laterality: N/A;  . CARDIAC CATHETERIZATION N/A 10/27/2015   Procedure: Left Heart Cath and Coronary Angiography;  Surgeon: [name],  MD;  Location: [facility];  Service: Cardiovascular;  Laterality: N/A;  . CORONARY ARTERY BYPASS GRAFT N/A 10/29/2015   Procedure: CORONARY ARTERY BYPASS GRAFTING (CABG) x Four UTILIZING THE LEFT INTERNAL MAMMARY ARTERY AND ENDOSCOPICALLY HARVESTED RIGHT SAPEHENEOUS VEINS.;  Surgeon: [name], MD;  Location: [facility];  Service: Open Heart Surgery;  Laterality: N/A;  . CYST REMOVAL HAND Right   . ESOPHAGOGASTRODUODENOSCOPY N/A 12/02/2015   Procedure: ESOPHAGOGASTRODUODENOSCOPY (EGD);  Surgeon: [name], MD;  Location: [facility];  Service: Gastroenterology;  Laterality: N/A;  . REPLACEMENT ASCENDING AORTA N/A 10/29/2015   Procedure: REPLACEMENT OF ASCENDING AORTA USING 34MM X 30CM WOVEN DOUBLE VELOUR VASCULAR GRAFT.;  Surgeon: [name], MD;  Location: [facility];  Service: Open Heart Surgery;  Laterality: N/A;  . TEE WITHOUT CARDIOVERSION N/A 10/29/2015   Procedure: TRANSESOPHAGEAL ECHOCARDIOGRAM (TEE);  Surgeon: [name], MD;  Location: [facility];  Service: Open Heart Surgery;  Laterality: N/A;  . UVULECTOMY N/A 12/10/2015   Procedure: UVULECTOMY;  Surgeon: [name], MD;  Location: [facility];  Service: ENT;  Laterality: N/A;     Past Medical History:  Diagnosis Date  . 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 Carpal tunnel syndrome 09/24/2014   Bilateral  . 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 Diabetes mellitus, type 2 ([facility])   . Erectile dysfunction   .  Essential hypertension   . Hyperlipidemia      History  Smoking Status  . Never Smoker  Smokeless Tobacco  . Never Used    History  Alcohol Use No     Allergies  Allergen Reactions  . No Known Allergies     Current Outpatient Prescriptions  Medication Sig Dispense Refill  . atorvastatin (LIPITOR) 40 MG tablet Take 1 tablet (40 mg total) by mouth daily. 30 tablet 11  . B-D ULTRAFINE III SHORT PEN 31G X 8 MM MISC USE 4 TIMES  DAILY AS DIRECTED 100 each 0  . famotidine (PEPCID) 20 MG tablet Take 1 tablet (20 mg total) by mouth 2 (two) times daily. 30 tablet 0  . ferrous gluconate (FERGON) 324 MG tablet Take 1 tablet (324 mg total) by mouth 2 (two) times daily with a meal. 60 tablet 1  . folic acid (FOLVITE) 1 MG tablet Take 1 tablet (1 mg total) by mouth daily. 30 tablet 1  . glucose blood test strip Use as instructed 100 each 12  . Insulin Glargine (LANTUS SOLOSTAR) 100 UNIT/ML Solostar Pen Inject 15 Units into the skin daily at 10 pm. 5 pen 5  . metFORMIN (GLUCOPHAGE) 1000 MG tablet Take 1 tablet (1,000 mg total) by mouth 2 (two) times daily with a meal. 180 tablet 1  . metoprolol tartrate (LOPRESSOR) 25 MG tablet Take 0.5 tablets (12.5 mg total) by mouth 2 (two) times daily. 60 tablet 0  . ondansetron (ZOFRAN ODT) 4 MG disintegrating tablet Take 1 tablet (4 mg total) by mouth every 8 (eight) hours as needed for nausea or vomiting. 30 tablet 0  . rivaroxaban (XARELTO) 20 MG TABS tablet Take 1 tablet (20 mg total) by mouth daily with supper. 30 tablet 0  . senna-docusate (SENOKOT-S) 8.6-50 MG tablet Take 1 tablet by mouth 2 (two) times daily. 30 tablet 0   No current facility-administered medications for this visit.        Physical Exam: BP 104/66 (BP Location: Left Arm, Patient Position: Sitting, Cuff Size: Normal)   Pulse 100   Resp 20   Ht 6' (1.829 m)   Wt 174 lb (78.9 kg)   SpO2 98% Comment: RA  BMI 23.60 kg/m   General appearance: alert and cooperative Neurologic: intact Heart: regular rate and rhythm, S1, S2 normal, no murmur, click, rub or gallop, valve sounds are crisp without murmur of aortic insufficiency Lungs: clear to auscultation bilaterally Abdomen: soft, non-tender; bowel sounds normal; no masses,  no organomegaly Extremities: extremities normal, atraumatic, no cyanosis or edema and Homans sign is negative, no sign of DVT Wound: Sternum is well healed   Diagnostic Studies & Laboratory  data:     Recent Radiology Findings:   Dg Chest 2 View  Result Date: 12/20/2015 CLINICAL DATA:  CABG.  Pain. EXAM: CHEST  2 VIEW COMPARISON:  11/30/2015. FINDINGS: Prior CABG. Prior cardiac valve replacement. Heart size stable. Interim resolution of left base infiltrate. Interim near complete resolution of right base subsegmental atelectasis. No focal infiltrate. No pleural effusion or pneumothorax. No acute bony abnormality . IMPRESSION: 1. Prior CABG. Prior cardiac valve replacement. Heart size stable. No CHF. 2. Interim resolution of left base infiltrate. Interim near complete resolution of right base subsegmental atelectasis. Electronically Signed   By: Marcello Moores  Register   On: 12/20/2015 15:42      Recent Lab Findings: Lab Results  Component Value Date   WBC 10.3 12/16/2015   HGB 11.6 (L) 12/16/2015   HCT  35.0 (L) 12/16/2015   PLT 312.0 12/16/2015   GLUCOSE 207 (H) 12/16/2015   CHOL 228 (H) 07/23/2015   TRIG 77 07/23/2015   HDL 98 07/23/2015   LDLDIRECT 112.6 05/14/2012   LDLCALC 115 07/23/2015   ALT 18 11/30/2015   AST 21 11/30/2015   NA 137 12/16/2015   K 4.4 12/16/2015   CL 99 12/16/2015   CREATININE 1.12 12/16/2015   BUN 12 12/16/2015   CO2 28 12/16/2015   TSH 1.86 07/23/2015   INR 2.52 11/24/2015   HGBA1C 11.2 (H) 10/29/2015      Assessment / Plan:      After a slow early postoperative course the patient seems to be making good progress now, he's increasing his physical activity appropriately. He has no signs or symptoms of congestive heart failure I've encouraged him to start in cardiac rehabilitation as soon as he completes his home PT rehabilitation I plan to see him back in 6 months, and we'll make arrangements for a follow-up CTA of the chest scan in one year.  I did discuss with the patient endocarditis prophylaxis need.  [name] MD      [address] [place] [address] Office [phone]   Beeper [phone]  12/20/2015  4:13 PM

## 2015-12-20 NOTE — Patient Instructions (Signed)
Endocarditis Information  You may be at risk for developing endocarditis since you have  an artificial heart valve  or a repaired heart valve. Endocarditis is an infection of the lining of the heart or heart valves.   Certain surgical and dental procedures may put you at risk,  such as teeth cleaning or other dental procedures or any surgery involving the respiratory, urinary, gastrointestinal tract, gallbladder or prostate.   Notify your doctor or dentist before having any invasive procedures. You will need to take antibiotics before certain procedures.   To prevent endocarditis, maintain good oral health. Seek prompt medical attention for any mouth/gum, skin or urinary tract infections.   Heart  Valve Replacement-Care After  Read the instructions outlined below and refer to this sheet for the next few weeks. These discharge instructions provide you with general information on caring for yourself after you leave the hospital. Your surgeon may also give you specific instructions. While your treatment has been planned according to the most current medical practices available, unavoidable complications occasionally occur. If you have any problems or questions after discharge, please call your surgeon. AFTER THE PROCEDURE  Full recovery from heart valve surgery can take several months.   Blood thinning (anticoagulation) treatment with warfarin is some times prescribed for 6 weeks to 3 months after surgery for those with biological valves. It is prescribed for life for those with mechanical valves.   Recovery includes healing of the surgical incision. There is a gradual building of stamina and exercise abilities. An exercise program under the direction of a physical therapist may be recommended.   Once you have an artificial valve, your heart function and your life will return to normal. You usually feel better after surgery. Shortness of breath and fatigue should lessen. If your heart was already  severely damaged before your surgery, you may continue to have problems.    Individuals with an aortic valve replacement need to take antibiotics before having dental work or other surgical procedures. This is called prophylactic antibiotic treatment. These drugs help to prevent infective endocarditis. Antibiotics are only recommended for individuals with the highest risk for developing infective endocarditis. Let your dentist and your caregiver know if you have a history of any of the following so that the necessary precautions can be taken:  Endocarditis in the past.   An artificial (prosthetic) heart valve.  HOME CARE INSTRUCTIONS   Use all medications as prescribed.   Take your temperature every morning for the first week after surgery. Record these.   Weigh yourself every morning for at least the first week after surgery and record.   Do not lift more than 15 pounds until your breastbone (sternum) has healed, about 3 months. Avoid all activities which would place strain on your incision.   You may shower as soon as directed by your caregiver after surgery. Pat incisions dry. Do not rub incisions with washcloth or towel.   Avoid driving for 4 weeks following surgery or as instructed.  Pain Control  If a prescription was given for a pain reliever, please follow your doctor's directions.   If the pain is not relieved by your medicine, becomes worse, or you have difficulty breathing, call your surgeon.  Activity  Take frequent rest periods throughout the day.   Wait one week before returning to strenuous activities such as heavy lifting (more than 10 pounds), pushing or pulling.   Talk with your doctor about when you may return to work and your exercise routine.     Do not drive while taking prescription pain medication.  Nutrition  You may resume your normal diet.   Drink plenty of fluids (6-8 glasses a day).   Eat a well-balanced diet.   Call your caregiver for persistent  nausea or vomiting.  Elimination Your normal bowel function should return. If constipation should occur, you may:  Take a mild laxative.   Add fruit and bran to your diet.   Drink more fluids.   Call your doctor if constipation is not relieved.  SEEK IMMEDIATE MEDICAL CARE IF:   You develop chest pain which is not coming from your surgical cut (incision).   You develop shortness of breath or have difficulty breathing.   You develop a temperature over 101 F (38.3 C).   You have a sudden weight gain. Let your caregiver know what the weight gain is.   You develop a rash.   You develop any reaction or side effects to medications given.   You have increased bleeding from wounds.   You see redness, swelling, or have increasing pain in wounds.   You have pus coming from your wound.   You develop lightheadedness or feel faint.   

## 2015-12-24 ENCOUNTER — Telehealth: Payer: Self-pay | Admitting: Adult Health

## 2015-12-24 NOTE — Telephone Encounter (Signed)
Per Evelena Peat pt is D/C from skill nursing and home health nursing.  Pt will continue for another week of PT. The o  Except ortostatic 154/88 sitting 108/78 standing no systems appears to be stable

## 2015-12-24 NOTE — Telephone Encounter (Signed)
FYI

## 2015-12-30 ENCOUNTER — Encounter: Payer: Self-pay | Admitting: Adult Health

## 2015-12-30 ENCOUNTER — Ambulatory Visit (INDEPENDENT_AMBULATORY_CARE_PROVIDER_SITE_OTHER): Payer: Medicare Other | Admitting: Adult Health

## 2015-12-30 VITALS — BP 118/60 | Temp 98.2°F | Wt 176.0 lb

## 2015-12-30 DIAGNOSIS — E1140 Type 2 diabetes mellitus with diabetic neuropathy, unspecified: Secondary | ICD-10-CM | POA: Diagnosis not present

## 2015-12-30 DIAGNOSIS — E1165 Type 2 diabetes mellitus with hyperglycemia: Secondary | ICD-10-CM | POA: Diagnosis not present

## 2015-12-30 DIAGNOSIS — Z794 Long term (current) use of insulin: Secondary | ICD-10-CM | POA: Diagnosis not present

## 2015-12-30 DIAGNOSIS — IMO0002 Reserved for concepts with insufficient information to code with codable children: Secondary | ICD-10-CM

## 2015-12-30 NOTE — Patient Instructions (Signed)
It was great seeing you today!  You have made great progress! Your A1c has dropped form 11.2 to 6.3 in 2 months  Keep up the good work!   Please follow up with me in 3 months for your physical

## 2015-12-30 NOTE — Progress Notes (Signed)
   Subjective:    Patient ID: [name], male    DOB: 1945-04-22, 70 y.o.   MRN: [id number]  HPI  70 year old male who  has a past medical history of Aortic stenosis; Carpal tunnel syndrome (09/24/2014); Coronary artery disease; Diabetes mellitus, type 2 ([facility]); Erectile dysfunction; Essential hypertension; and Hyperlipidemia. He presents to the office today for follow up regarding diabetes.   He currently takes Metformin 1000mg  and Insulin Lantus 15 units daily.   He reports that his blood sugars have been well controlled at home. His log shows readings in the 120-130's with one high of 230.   He feels as though he is progressing well with rehab. Monday will be his last at home PT visit and then he will go to cardiac rehab at the hospital.   His A1c two months ago was 11.2    Lab Results  Component Value Date   HGBA1C 11.2 (H) 10/29/2015    Review of Systems  Constitutional: Negative.   Respiratory: Negative.   Cardiovascular: Negative.   Genitourinary: Negative.   Neurological: Negative.   Hematological: Negative.   All other systems reviewed and are negative.      Objective:   Physical Exam  Constitutional: He is oriented to person, place, and time. He appears well-developed and well-nourished. No distress.  Cardiovascular: Normal rate, regular rhythm, normal heart sounds and intact distal pulses.  Exam reveals no gallop and no friction rub.   No murmur heard. Pulmonary/Chest: Effort normal and breath sounds normal. No respiratory distress. He has no wheezes. He has no rales. He exhibits no tenderness.  Neurological: He is alert and oriented to person, place, and time.  Skin: Skin is warm and dry. No rash noted. He is not diaphoretic. No erythema. No pallor.  Psychiatric: He has a normal mood and affect. His behavior is normal. Judgment and thought content normal.  Nursing note and vitals reviewed.      Assessment & Plan:  1. Uncontrolled type 2 diabetes mellitus  with diabetic neuropathy, with long-term current use of insulin (HCC) - Hemoglobin A1c - A1c has dropped from 11.2 to 6.3 in the last two months - Continue to monitor at home  - Continue with diet and exercise - Follow up in 3 months for CPE or sooner if needed  [name], NP

## 2016-01-20 ENCOUNTER — Ambulatory Visit (INDEPENDENT_AMBULATORY_CARE_PROVIDER_SITE_OTHER): Payer: Medicare Other | Admitting: Cardiovascular Disease

## 2016-01-20 ENCOUNTER — Encounter: Payer: Self-pay | Admitting: Cardiovascular Disease

## 2016-01-20 VITALS — BP 138/86 | HR 84 | Ht 72.0 in | Wt 177.5 lb

## 2016-01-20 DIAGNOSIS — I480 Paroxysmal atrial fibrillation: Secondary | ICD-10-CM | POA: Diagnosis not present

## 2016-01-20 DIAGNOSIS — I2510 Atherosclerotic heart disease of native coronary artery without angina pectoris: Secondary | ICD-10-CM

## 2016-01-20 NOTE — Progress Notes (Signed)
[name] Date of Birth  05/25/1945     [address]    [address]    [place], [place]  [address]        Problems: 1. Chest pain 2. Hypercholesterolemia 3. Diabetes mellitus 4. Mild dilatation of the ascending aorta by CT scan-4.4 cm 5. Coronary calcifications by CT scan 6. Pulmonary nodule 7. Mild aortic stenosis     [name] is a 70 year old gentleman who presents today for further evaluation of his chest pain. These chest pains are described as a left-sided pain. He seemed to come on with rest and exertion.  The pains last for hours.    He has been quite regularly.  He does not do any regular exercise.  He works as a Sports coach.  He had a stress Myoview study which was normal. His echocardiogram revealed  Left ventricle: The cavity size was normal. Wall thickness was increased in a pattern of mild LVH. Systolic function was normal. The estimated ejection fraction was in the range of 55% to 60%. 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 Mean gradient: 54mm Hg (S). Peak gradient: 33mm Hg (S). - Atrial septum: No defect or patent foramen ovale was identified.  He still has some occasional episodes of chest pain that he describes as a "pins and needles" like sensation.  He's able to get out and exercise without any problems.  Dec. 11, 2015:  [name] is a 70 yo who I follow for episodes of chest pain, mild aortic stenosis, mild aortic dilitation He is seen back after a 2 year absence.   He is now retired from being a Media planner,  Now semi retired - works 4 hours a day.   No CP or dyspnea.   Walks occasionally ,  A recent echo showed mild AS and trace MR.  He has mild aortic dilitation.  July 23 2015:  [name] is seen back after a 2 year absence . He's continued to have some episodes of chest pain. We performed a stress Myoview study on him in 2013 which was normal. These episodes of pain are  not associated with eating, drinking, exercise, or change of position. The pains are exacerbated by leaning forward. He's not tried any specific medications. He still works as a Sports coach at [employer] .  He notes that the chest pain will hurt if he sweeping or mopping quite a bit.  He notes some shortness of breath with exertion when he is climbing stairs. This seems to be stable.  Dec. 7, 2017:  [name] is seen back  Had CABG ( 4 vessel)  + AVR (23MM MAGNA EASE TISSUE VALVE. (N/A) on Sept.  left internal mammary to the left anterior descending coronary artery, sequential reverse SVG  to the first and second obtuse marginal, SVG to the distal right coronary artery  He has had multiple hospitalizations (5)  since that time for nausea and vomiting. Eventually his amiodarone was discontinued.  This seems to have helped   Hospitalization was complicated by trauma to the [place] - required surgical removal .   He was told by someone to stop his Xarelto and start an aspirin.  Took Xarelto for a month.      Current Outpatient Prescriptions on File Prior to Visit  Medication Sig Dispense Refill  . atorvastatin (LIPITOR) 40 MG tablet Take 1 tablet (40 mg total) by mouth daily. 30 tablet  11  . B-D ULTRAFINE III SHORT PEN 31G X 8 MM MISC USE 4 TIMES DAILY AS DIRECTED 100 each 0  . ferrous gluconate (FERGON) 324 MG tablet Take 1 tablet (324 mg total) by mouth 2 (two) times daily with a meal. 60 tablet 1  . folic acid (FOLVITE) 1 MG tablet Take 1 tablet (1 mg total) by mouth daily. 30 tablet 1  . glucose blood test strip Use as instructed 100 each 12  . Insulin Glargine (LANTUS SOLOSTAR) 100 UNIT/ML Solostar Pen Inject 15 Units into the skin daily at 10 pm. 5 pen 5  . metFORMIN (GLUCOPHAGE) 1000 MG tablet Take 1 tablet (1,000 mg total) by mouth 2 (two) times daily with a meal. 180 tablet 1  . metoprolol tartrate (LOPRESSOR) 25 MG tablet Take 0.5 tablets (12.5 mg total) by mouth 2  (two) times daily. 60 tablet 0  . ondansetron (ZOFRAN ODT) 4 MG disintegrating tablet Take 1 tablet (4 mg total) by mouth every 8 (eight) hours as needed for nausea or vomiting. 30 tablet 0  . rivaroxaban (XARELTO) 20 MG TABS tablet Take 1 tablet (20 mg total) by mouth daily with supper. 30 tablet 0  . senna-docusate (SENOKOT-S) 8.6-50 MG tablet Take 1 tablet by mouth 2 (two) times daily. 30 tablet 0   No current facility-administered medications on file prior to visit.     Allergies  Allergen Reactions  . No Known Allergies     Past Medical History:  Diagnosis Date  . 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 Carpal tunnel syndrome 09/24/2014   Bilateral  . 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 Diabetes mellitus, type 2 ([facility])   . Erectile dysfunction   . Essential hypertension   . Hyperlipidemia     Past Surgical History:  Procedure Laterality Date  . AORTIC VALVE REPLACEMENT N/A 10/29/2015   Procedure: AORTIC VALVE REPLACEMENT (AVR) WITH 23MM MAGNA EASE TISSUE VALVE.;  Surgeon: [name], MD;  Location: [facility];  Service: Open Heart Surgery;  Laterality: N/A;  . CARDIAC CATHETERIZATION N/A 10/27/2015   Procedure: Left Heart Cath and Coronary Angiography;  Surgeon: [name], MD;  Location: [facility];  Service: Cardiovascular;  Laterality: N/A;  . CORONARY ARTERY BYPASS GRAFT N/A 10/29/2015   Procedure: CORONARY ARTERY BYPASS GRAFTING (CABG) x Four UTILIZING THE LEFT INTERNAL MAMMARY ARTERY AND ENDOSCOPICALLY HARVESTED RIGHT SAPEHENEOUS VEINS.;  Surgeon: [name], MD;  Location: [facility];  Service: Open Heart Surgery;  Laterality: N/A;  . CYST REMOVAL HAND Right   . ESOPHAGOGASTRODUODENOSCOPY N/A 12/02/2015   Procedure: ESOPHAGOGASTRODUODENOSCOPY (EGD);  Surgeon: [name], MD;  Location: [facility];  Service: Gastroenterology;  Laterality: N/A;  . REPLACEMENT ASCENDING AORTA N/A  10/29/2015   Procedure: REPLACEMENT OF ASCENDING AORTA USING 34MM X 30CM WOVEN DOUBLE VELOUR VASCULAR GRAFT.;  Surgeon: [name], MD;  Location: [facility];  Service: Open Heart Surgery;  Laterality: N/A;  . TEE WITHOUT CARDIOVERSION N/A 10/29/2015   Procedure: TRANSESOPHAGEAL ECHOCARDIOGRAM (TEE);  Surgeon: [name], MD;  Location: [facility];  Service: Open Heart Surgery;  Laterality: N/A;  . UVULECTOMY N/A 12/10/2015   Procedure: UVULECTOMY;  Surgeon: [name], MD;  Location: [facility];  Service: ENT;  Laterality: N/A;    History  Smoking Status  . Never Smoker  Smokeless Tobacco  . Never Used    History  Alcohol Use No  Family History  Problem Relation Age of Onset  . Cancer Father     Throat cancer  . Diabetes Sister     Reviw of Systems:  Reviewed in the HPI.  All other systems are negative.  Physical Exam: Blood pressure 138/86, pulse 84, height 6' (1.829 m), weight 177 lb 8 oz (80.5 kg). General: Well developed, well nourished, in no acute distress.  Head: Normocephalic, atraumatic, sclera non-icteric, mucus membranes are moist,   Neck: Supple. Carotids are 2 + without bruits ( there is radiation of the systolic murmur ) . No JVD  Lungs: Clear bilaterally to auscultation.  Heart: regular rate  There is a [id number] systolic murmur with radiation up into both carotids .   Slightly blunted radial pulses.   Abdomen: Soft, non-tender, non-distended with normal bowel sounds. No hepatomegaly. No rebound/guarding. No masses.  Msk:  Strength and tone are normal  Extremities: No clubbing or cyanosis. No edema.  Distal pedal pulses are 2+ and equal bilaterally.  Neuro: Alert and oriented X 3. Moves all extremities spontaneously.  Psych:  Responds to questions appropriately with a normal affect.  ECG: July 23, 2015:   NSR at 70.  Normal .   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 Following .   3. CAD - s/p CABG:   He's feeling quite a bit better.  4. Paroxysmal atrial fib ablation. He had atrial fib following his surgery. We tried him on amiodarone but that apparently also a caused him to have severe nausea and vomiting. He was admitted to the hospital on several occasions for this nausea. He felt better after we discontinued the amiodarone. At present he clinically is in normal sinus rhythm. He has stopped his Xarelto. He is on aspirin.  5. Hyperlipidemia: will check labs at next visit .  6. Diabetes mellitus: Continue with his current medications. He'll continue to follow-up with Dr. Buddy Duty .  6. HTN:  Stable    Mertie Moores, MD  01/20/2016 2:21 PM    [address],  [place], [place]  [address] Pager [phone] Phone: [phone]; Fax: [phone]

## 2016-01-20 NOTE — Patient Instructions (Signed)
Medication Instructions:  Your physician recommends that you continue on your current medications as directed. Please refer to the Current Medication list given to you today.   Labwork: Your physician recommends that you return for lab work in: 3 months on the day of or a few days before your office visit with Dr. [name].  You will need to FAST for this appointment - nothing to eat or drink after midnight the night before except water.   Testing/Procedures: None Ordered   Follow-Up: Your physician recommends that you schedule a follow-up appointment in: 3 months with Dr. [name]   If you need a refill on your cardiac medications before your next appointment, please call your pharmacy.   Thank you for choosing [facility]! Lynzy Rawles, RN [phone]    

## 2016-01-27 DIAGNOSIS — D3709 Neoplasm of uncertain behavior of other specified sites of the oral cavity: Secondary | ICD-10-CM | POA: Insufficient documentation

## 2016-01-28 ENCOUNTER — Telehealth: Payer: Self-pay | Admitting: Cardiovascular Disease

## 2016-01-28 NOTE — Telephone Encounter (Signed)
New message      Pt had cabg in sept.  He want to know when can he return to work?  He is a Retail buyer.

## 2016-01-28 NOTE — Telephone Encounter (Signed)
Left detailed message on patient's voice mail that he needs to call Dr. Everrett Coombe office for clearance to return to work s/p CABG. I advised to call back with additional questions or concerns.

## 2016-02-01 ENCOUNTER — Encounter (HOSPITAL_COMMUNITY)
Admission: RE | Admit: 2016-02-01 | Discharge: 2016-02-01 | Disposition: A | Discharge status: Home / Self Care | Payer: Medicare Other | Source: Ambulatory Visit | Attending: Cardiovascular Disease | Admitting: Cardiovascular Disease

## 2016-02-01 ENCOUNTER — Telehealth: Payer: Self-pay | Admitting: Cardiovascular Disease

## 2016-02-01 ENCOUNTER — Encounter (HOSPITAL_COMMUNITY): Payer: Self-pay

## 2016-02-01 VITALS — BP 114/70 | HR 83 | Ht 70.5 in | Wt 179.7 lb

## 2016-02-01 DIAGNOSIS — Z951 Presence of aortocoronary bypass graft: Secondary | ICD-10-CM | POA: Diagnosis present

## 2016-02-01 DIAGNOSIS — Z952 Presence of prosthetic heart valve: Secondary | ICD-10-CM | POA: Diagnosis not present

## 2016-02-01 NOTE — Telephone Encounter (Signed)
Follow Up   [name] is calling back to find out when can he go back to work . His Job is calling wanting to know . Please call

## 2016-02-01 NOTE — Progress Notes (Signed)
Cardiac Individual Treatment Plan  Patient Details  Name: [name] MRN: [id number] Date of Birth: October 25, 1945 Referring Provider:   Flowsheet Row CARDIAC REHAB PHASE II ORIENTATION from 02/01/2016 in [facility]  Referring Provider  [name], Arnette Norris MD      Initial Encounter Date:  [place] PHASE II ORIENTATION from 02/01/2016 in [place]  Date  02/01/16  Referring Provider  [name], Arnette Norris MD      Visit Diagnosis: S/P CABG x 4  S/P AVR (aortic valve replacement)  Patient's Home Medications on Admission:  Current Outpatient Prescriptions:  .  aspirin EC 81 MG tablet, Take 81 mg by mouth daily., Disp: , Rfl:  .  atorvastatin (LIPITOR) 40 MG tablet, Take 1 tablet (40 mg total) by mouth daily., Disp: 30 tablet, Rfl: 11 .  B-D ULTRAFINE III SHORT PEN 31G X 8 MM MISC, USE 4 TIMES DAILY AS DIRECTED, Disp: 100 each, Rfl: 0 .  ferrous gluconate (FERGON) 324 MG tablet, Take 1 tablet (324 mg total) by mouth 2 (two) times daily with a meal., Disp: 60 tablet, Rfl: 1 .  folic acid (FOLVITE) 1 MG tablet, Take 1 tablet (1 mg total) by mouth daily., Disp: 30 tablet, Rfl: 1 .  glucose blood test strip, Use as instructed, Disp: 100 each, Rfl: 12 .  Insulin Glargine (LANTUS SOLOSTAR) 100 UNIT/ML Solostar Pen, Inject 15 Units into the skin daily at 10 pm. (Patient taking differently: Inject 17 Units into the skin daily at 10 pm. ), Disp: 5 pen, Rfl: 5 .  metFORMIN (GLUCOPHAGE) 1000 MG tablet, Take 1 tablet (1,000 mg total) by mouth 2 (two) times daily with a meal., Disp: 180 tablet, Rfl: 1 .  metoprolol tartrate (LOPRESSOR) 25 MG tablet, Take 0.5 tablets (12.5 mg total) by mouth 2 (two) times daily., Disp: 60 tablet, Rfl: 0 .  ondansetron (ZOFRAN ODT) 4 MG disintegrating tablet, Take 1 tablet (4 mg total) by mouth every 8 (eight) hours as needed for nausea or vomiting., Disp: 30 tablet, Rfl: 0  Past Medical  History: Past Medical History:  Diagnosis Date  . 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 Carpal tunnel syndrome 09/24/2014   Bilateral  . 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 Diabetes mellitus, type 2 ([place])   . Erectile dysfunction   . Essential hypertension   . Hyperlipidemia     Tobacco Use: History  Smoking Status  . Never Smoker  Smokeless Tobacco  . Never Used    Labs: Recent Review Flowsheet Data    Labs for ITP Cardiac and Pulmonary Rehab Latest Ref Rng & Units 10/30/2015 10/30/2015 10/30/2015 10/31/2015 12/06/2015   Cholestrol 125 - 200 mg/dL - - - - -   LDLCALC <130 mg/dL - - - - -   LDLDIRECT mg/dL - - - - -   HDL >=40 mg/dL - - - - -   Trlycerides <150 mg/dL - - - - -   Hemoglobin A1c 4.8 - 5.6 % - - - - -   PHART 7.350 - 7.450 7.343(L) - - - -   PCO2ART 32.0 - 48.0 mmHg 38.8 - - - -   HCO3 20.0 - 28.0 mmol/L 21.2 23.0 - - -   TCO2 0 - 100 mmol/L 22 24 23 25 28    ACIDBASEDEF 0.0 - 2.0 mmol/L 4.0(H) 2.0 - - -   O2SAT %  96.0 98.0 - - -      Capillary Blood Glucose: Lab Results  Component Value Date   GLUCAP 187 (H) 12/11/2015   GLUCAP 143 (H) 12/11/2015   GLUCAP 169 (H) 12/10/2015   GLUCAP 232 (H) 12/10/2015   GLUCAP 144 (H) 12/10/2015     Exercise Target Goals: Date: 02/01/16  Exercise Program Goal: Individual exercise prescription set with THRR, safety & activity barriers. Participant demonstrates ability to understand and report RPE using BORG scale, to self-measure pulse accurately, and to acknowledge the importance of the exercise prescription.  Exercise Prescription Goal: Starting with aerobic activity 30 plus minutes a day, 3 days per week for initial exercise prescription. Provide home exercise prescription and guidelines that participant acknowledges understanding prior to discharge.  Activity Barriers & Risk Stratification:     Activity Barriers & Cardiac Risk Stratification -  02/01/16 1634      Activity Barriers & Cardiac Risk Stratification   Activity Barriers None   Cardiac Risk Stratification High      6 Minute Walk:     6 Minute Walk    Row Name 02/01/16 1635 02/01/16 1638       6 Minute Walk   Phase Initial  -    Distance 1116 feet  -    Walk Time 6 minutes  -    # of Rest Breaks 0  -    MPH  - 2.12    METS  - 3.6    RPE 11  -    VO2 Peak  - 12.74    Symptoms No  -    Resting HR 83 bpm  -    Resting BP 114/70  -    Max Ex. HR 99 bpm  -    Max Ex. BP 108/70  -    2 Minute Post BP 108/64  -       Initial Exercise Prescription:     Initial Exercise Prescription - 02/01/16 1600      Date of Initial Exercise RX and Referring Provider   Date 02/01/16   Referring Provider [name], Philip MD     Treadmill   MPH 2   Grade 0   Minutes 10   METs 2.53     Bike   Level 2  upright scifit bike   Minutes 10   METs 2     NuStep   Level 2   Minutes 10   METs 2     Prescription Details   Frequency (times per week) 3   Duration Progress to 30 minutes of continuous aerobic without signs/symptoms of physical distress     Intensity   THRR 40-80% of Max Heartrate 61-122   Ratings of Perceived Exertion 11-13   Perceived Dyspnea 0-4     Progression   Progression Continue progressive overload as per policy without signs/symptoms or physical distress.     Resistance Training   Training Prescription Yes   Weight 2lbs   Reps 10-12      Perform Capillary Blood Glucose checks as needed.  Exercise Prescription Changes:   Exercise Comments:   Discharge Exercise Prescription (Final Exercise Prescription Changes):   Nutrition:  Target Goals: Understanding of nutrition guidelines, daily intake of sodium 1500mg , cholesterol 200mg , calories 30% from fat and 7% or less from saturated fats, daily to have 5 or more servings of fruits and vegetables.  Biometrics:     Pre Biometrics - 02/01/16 1636      Pre Biometrics  Waist  Circumference 34 inches   Hip Circumference 37.5 inches   Waist to Hip Ratio 0.91 %   Triceps Skinfold 17 mm   % Body Fat 24.1 %   Grip Strength 41 kg   Flexibility 7.5 in   Single Leg Stand 30 seconds       Nutrition Therapy Plan and Nutrition Goals:   Nutrition Discharge: Nutrition Scores:   Nutrition Goals Re-Evaluation:   Psychosocial: Target Goals: Acknowledge presence or absence of depression, maximize coping skills, provide positive support system. Participant is able to verbalize types and ability to use techniques and skills needed for reducing stress and depression.  Initial Review & Psychosocial Screening:     Initial Psych Review & Screening - 02/01/16 [place]? Yes   Comments  brief psychosocial assesment reveals no furhter intervention needed at this time     Barriers   Psychosocial barriers to participate in program There are no identifiable barriers or psychosocial needs.     Screening Interventions   Interventions Encouraged to exercise      Quality of Life Scores:     Quality of Life - 02/01/16 1355      Quality of Life Scores   Health/Function Pre 25.6 %   Socioeconomic Pre 27 %   Psych/Spiritual Pre 29.14 %   Family Pre 16.8 %   GLOBAL Pre 25.27 %      PHQ-9: Recent Review Flowsheet Data    Depression screen [facility] 2/9 04/03/2014 03/03/2013   Decreased Interest 0 0   Down, Depressed, Hopeless 0 0   PHQ - 2 Score 0 0      Psychosocial Evaluation and Intervention:   Psychosocial Re-Evaluation:   Vocational Rehabilitation: Provide vocational rehab assistance to qualifying candidates.   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 Participant will state understanding/return demonstration of topics presented.  Learning Barriers/Preferences:     Learning Barriers/Preferences - 02/01/16 1635      Learning Barriers/Preferences   Learning Barriers Sight   Learning Preferences Audio;Video      Education Topics: Count Your Pulse:  -Group instruction provided by verbal instruction, demonstration, patient participation and written materials to support subject.  Instructors address importance of being able to find your pulse and how to count your pulse when at home without a heart monitor.  Patients get hands on experience counting their pulse with staff help and individually.   Heart Attack, Angina, and Risk Factor Modification:  -Group instruction provided by verbal instruction, video, and written materials to support subject.  Instructors address signs and symptoms of angina and heart attacks.    Also discuss risk factors for heart disease and how to make changes to improve heart health risk factors.   Functional Fitness:  -Group instruction provided by verbal instruction, demonstration, patient participation, and written materials to support subject.  Instructors address safety measures for doing things around the house.  Discuss how to get up and down off the floor, how to pick things up properly, how to safely get out of a chair without assistance, and  balance training.   Meditation and Mindfulness:  -Group instruction provided by verbal instruction, patient participation, and written materials to support subject.  Instructor addresses importance of mindfulness and meditation practice to help reduce stress and improve awareness.  Instructor also leads participants through a meditation exercise.    Stretching for Flexibility and Mobility:  -Group instruction provided by verbal instruction, patient participation, and written  materials to support subject.  Instructors lead participants through series of stretches that are designed to increase flexibility thus improving mobility.  These stretches are additional exercise for major muscle groups that are typically performed during regular warm up and cool down.   Hands Only CPR Anytime:  -Group instruction provided by verbal instruction, video, patient participation and written materials to support subject.  Instructors co-teach with AHA video for hands only CPR.  Participants get hands on experience with mannequins.   Nutrition I class: Heart Healthy Eating:  -Group instruction provided by PowerPoint slides, verbal discussion, and written materials to support subject matter. The instructor gives an explanation and review of the Therapeutic Lifestyle Changes diet recommendations, which includes a discussion on lipid goals, dietary fat, sodium, fiber, plant stanol/sterol esters, sugar, and the components of a well-balanced, healthy diet.   Nutrition II class: Lifestyle Skills:  -Group instruction provided by PowerPoint slides, verbal discussion, and written materials to support subject matter. The instructor gives an explanation and review of label reading, grocery shopping for heart health, heart healthy recipe modifications, and ways to make healthier choices when eating out.   Diabetes Question & Answer:  -Group instruction provided by PowerPoint slides, verbal discussion, and written materials to support subject matter. The instructor gives an explanation and review of diabetes co-morbidities, pre- and post-prandial blood glucose goals, pre-exercise blood glucose goals, signs, symptoms, and treatment of hypoglycemia and hyperglycemia, and foot care basics.   Diabetes Blitz:  -Group instruction provided by PowerPoint slides, verbal discussion, and written materials to support subject matter. The instructor gives an explanation and review of the physiology behind type 1  and type 2 diabetes, diabetes medications and rational behind using different medications, pre- and post-prandial blood glucose recommendations and Hemoglobin A1c goals, diabetes diet, and exercise including blood glucose guidelines for exercising safely.    Portion Distortion:  -Group instruction provided by PowerPoint slides, verbal discussion, written materials, and food models to support subject matter. The instructor gives an explanation of serving size versus portion size, changes in portions sizes over the last 20 years, and what consists of a serving from each food group.   Stress Management:  -Group instruction provided by verbal instruction, video, and written materials to support subject matter.  Instructors review role of stress in heart disease and how to cope with stress positively.     Exercising on Your Own:  -Group instruction provided by verbal instruction, power point, and written materials to support subject.  Instructors discuss benefits of exercise, components of exercise, frequency and intensity of exercise, and end points for exercise.  Also discuss use of nitroglycerin and activating EMS.  Review options of places to exercise outside of rehab.  Review guidelines for sex with heart disease.   Cardiac Drugs I:  -Group instruction provided by verbal instruction and written materials to support subject.  Instructor reviews cardiac drug classes: antiplatelets, anticoagulants, beta blockers, and statins.  Instructor discusses reasons, side effects, and lifestyle considerations for each drug class.   Cardiac Drugs II:  -Group instruction provided by verbal instruction and written materials to support subject.  Instructor reviews cardiac drug classes: angiotensin converting enzyme inhibitors (ACE-I), angiotensin II receptor blockers (ARBs), nitrates, and calcium channel blockers.  Instructor discusses reasons, side effects, and lifestyle considerations for each drug  class.   Anatomy and Physiology of the Circulatory System:  -Group instruction provided by verbal instruction, video, and written materials to support subject.  Reviews functional anatomy of heart, how it relates to various diagnoses, and what role the heart plays in the overall system.   Knowledge Questionnaire Score:     Knowledge Questionnaire Score - 02/01/16 1354      Knowledge Questionnaire Score   Pre Score 19/24      Core Components/Risk Factors/Patient Goals at Admission:     Personal Goals and Risk Factors at Admission - 02/01/16 1723      Core Components/Risk Factors/Patient Goals on Admission    Weight Management Yes;Weight Loss   Intervention Weight Management/Obesity: Establish reasonable short term and long term weight goals.;Weight Management: Develop a combined nutrition and exercise program designed to reach desired caloric intake, while maintaining appropriate intake of nutrient and fiber, sodium and fats, and appropriate energy expenditure required for the weight goal.;Weight Management: Provide education and appropriate resources to help participant work on and attain dietary goals.;Obesity: Provide education and appropriate resources to help participant work on and attain dietary goals.   Expected Outcomes Short Term: Continue to assess and modify interventions until short term weight is achieved;Long Term: Adherence to nutrition and physical activity/exercise program aimed toward attainment of established weight goal;Weight Maintenance: Understanding of the daily nutrition guidelines, which includes 25-35% calories from fat, 7% or less cal from saturated fats, less than 200mg  cholesterol, less than 1.5gm of sodium, & 5 or more servings of fruits and vegetables daily;Weight Loss: Understanding of general recommendations for a balanced deficit meal plan, which promotes 1-2 lb weight loss per week and includes a negative energy balance of [phone] kcal/d;Understanding  recommendations for meals to include 15-35% energy as protein, 25-35% energy from fat, 35-60% energy from carbohydrates, less than 200mg  of dietary cholesterol, 20-35 gm of total fiber daily;Understanding of distribution of calorie intake throughout the day with the consumption of 4-5 meals/snacks   Increase Strength and Stamina Yes   Intervention Provide advice, education, support and counseling about physical activity/exercise needs.;Develop an individualized exercise prescription for aerobic and resistive training based on initial evaluation findings, risk stratification, comorbidities and participant's personal goals.   Expected Outcomes Achievement of increased cardiorespiratory fitness and enhanced flexibility, muscular endurance and strength shown through measurements of functional capacity and personal statement of participant.   Diabetes Yes   Intervention Provide education about signs/symptoms and action to take for hypo/hyperglycemia.;Provide education about proper nutrition, including hydration, and aerobic/resistive exercise prescription along with prescribed medications to achieve blood glucose in normal ranges: Fasting glucose 65-99 mg/dL   Expected Outcomes Short Term: Participant verbalizes understanding of the signs/symptoms and immediate care of hyper/hypoglycemia, proper foot care and importance of medication, aerobic/resistive exercise and nutrition plan for blood glucose control.;Long Term: Attainment of HbA1C < 7%.   Hypertension Yes   Intervention Provide education on lifestyle modifcations including regular physical activity/exercise, weight management, moderate sodium restriction and increased consumption of fresh fruit, vegetables, and low fat dairy, alcohol moderation, and smoking cessation.;Monitor prescription use compliance.   Expected Outcomes Short Term: Continued assessment and intervention until BP is < 140/58mm HG in hypertensive participants. < 130/4mm HG in  hypertensive participants with diabetes, heart failure or chronic kidney disease.;Long Term: Maintenance of blood pressure at  goal levels.   Lipids Yes   Intervention Provide education and support for participant on nutrition & aerobic/resistive exercise along with prescribed medications to achieve LDL 70mg , HDL >40mg .   Expected Outcomes Short Term: Participant states understanding of desired cholesterol values and is compliant with medications prescribed. Participant is following exercise prescription and nutrition guidelines.   Personal Goal Other Yes   Personal Goal Get better and improve heart health   Intervention Provide exercise programming to assist with improving cardiovascular fitness and functional capacity.   Expected Outcomes Pt will be able to increase exercise tolerance and improve heart health      Core Components/Risk Factors/Patient Goals Review:    Core Components/Risk Factors/Patient Goals at Discharge (Final Review):    ITP Comments:     ITP Comments    Row Name 02/01/16 1640           ITP Comments Dr. Fransico Him, Medical Director          Comments: Bastien attended orientation from 1330 to 1530 to review rules and guidelines for program. Completed 6 minute walk test, Intitial ITP, and exercise prescription.  VSS. Telemetry-Sinus Rhythm with occasional PVC.  Asymptomatic. [place]  Plans on beginning exercise on January 3rd.[name], RN,BSN 02/02/2016 9:03 AM

## 2016-02-01 NOTE — Progress Notes (Signed)
Cardiac Rehab Medication Review by a Pharmacist  Does the patient  feel that his/her medications are working for him/her?  yes  Has the patient been experiencing any side effects to the medications prescribed?  no  Does the patient measure his/her own blood pressure or blood glucose at home?  yes   Does the patient have any problems obtaining medications due to transportation or finances?   no  Understanding of regimen: fair Understanding of indications: fair Potential of compliance: good    Pharmacist comments: Pt presents for initial cardiac rehab visit. Pt endorses monitoring blood glucose once or twice daily, and BP regularly. Pt endorses no active issues at this time.  Arrie Senate, PharmD PGY-1 Pharmacy Resident Pager: [phone] 02/01/2016

## 2016-02-03 NOTE — Telephone Encounter (Signed)
Spoke with patient and advised him to call [employer] for return to work note.  He verbalized understanding and agreement.

## 2016-02-04 ENCOUNTER — Other Ambulatory Visit: Payer: Self-pay | Admitting: Pharmacist

## 2016-02-04 NOTE — Patient Outreach (Signed)
Outreach call to [name] regarding medication adherence to atorvastatin. Left a HIPAA compliant message on the patient's voicemail.   Harlow Asa, PharmD, [facility] [phone]

## 2016-02-09 ENCOUNTER — Ambulatory Visit (HOSPITAL_COMMUNITY): Payer: Self-pay

## 2016-02-11 ENCOUNTER — Encounter (HOSPITAL_COMMUNITY): Payer: Medicare Other

## 2016-02-14 ENCOUNTER — Encounter (HOSPITAL_COMMUNITY): Payer: Medicare Other

## 2016-02-16 ENCOUNTER — Encounter (HOSPITAL_COMMUNITY)
Admission: RE | Admit: 2016-02-16 | Discharge: 2016-02-16 | Disposition: A | Discharge status: Home / Self Care | Payer: Medicare Other | Source: Ambulatory Visit | Attending: Cardiovascular Disease | Admitting: Cardiovascular Disease

## 2016-02-16 DIAGNOSIS — Z952 Presence of prosthetic heart valve: Secondary | ICD-10-CM | POA: Diagnosis not present

## 2016-02-16 DIAGNOSIS — Z951 Presence of aortocoronary bypass graft: Secondary | ICD-10-CM | POA: Insufficient documentation

## 2016-02-16 LAB — GLUCOSE, CAPILLARY
GLUCOSE-CAPILLARY: 184 mg/dL — AB (ref 65–99)
Glucose-Capillary: 144 mg/dL — ABNORMAL HIGH (ref 65–99)

## 2016-02-16 NOTE — Progress Notes (Signed)
Daily Session Note  Patient Details  Name: [name] MRN: [id number] Date of Birth: 1946/01/06 Referring Provider:   Flowsheet Row CARDIAC REHAB PHASE II ORIENTATION from 02/01/2016 in [place]  Referring Provider  Mertie Moores MD      Encounter Date: 02/16/2016  Check In:     Session Check In - 02/16/16 1528      Check-In   Location [facility]   Staff Present Cleda Mccreedy, MS, Exercise Physiologist;Joann Rion, RN, Marga Melnick, RN, BSN   Supervising physician immediately available to respond to emergencies [facility] immediately available   Physician(s) Dr. Tana Coast    Medication changes reported     No   Fall or balance concerns reported    No   Warm-up and Cool-down Performed as group-led instruction   Resistance Training Performed No   VAD Patient? No     Pain Assessment   Currently in Pain? No/denies      Capillary Blood Glucose: Results for orders placed or performed during the hospital encounter of 02/16/16 (from the past 24 hour(s))  Glucose, capillary     Status: Abnormal   Collection Time: 02/16/16  3:14 PM  Result Value Ref Range   Glucose-Capillary 144 (H) 65 - 99 mg/dL  Glucose, capillary     Status: Abnormal   Collection Time: 02/16/16  4:10 PM  Result Value Ref Range   Glucose-Capillary 184 (H) 65 - 99 mg/dL     Goals Met:  Exercise tolerated well  Goals Unmet:  Not Applicable  Comments: Pt started cardiac rehab today.  Pt tolerated light exercise without difficulty. VSS, telemetry-Sinus Rhythm, asymptomatic.  Medication list reconciled. Pt denies barriers to medicaiton compliance.  PSYCHOSOCIAL ASSESSMENT:  PHQ-0. Pt exhibits positive coping skills, hopeful outlook with supportive family. No psychosocial needs identified at this time, no psychosocial interventions necessary.    Pt enjoys cooking and playing golf .   Pt oriented to exercise equipment and routine.    Understanding  verbalized.[name], RN,BSN 02/16/2016 4:30 PM   Dr. Fransico Him is Medical Director for Cardiac Rehab at [facility].

## 2016-02-17 NOTE — Progress Notes (Signed)
Cardiac Individual Treatment Plan  Patient Details  Name: [name] MRN: [id number] Date of Birth: 1945/05/14 Referring Provider:   Flowsheet Row CARDIAC REHAB PHASE II ORIENTATION from 02/01/2016 in [place]  Referring Provider  [name], Arnette Norris MD      Initial Encounter Date:  [place] PHASE II ORIENTATION from 02/01/2016 in [place]  Date  02/01/16  Referring Provider  [name], Arnette Norris MD      Visit Diagnosis: S/P CABG x 4  S/P AVR (aortic valve replacement)  Patient's Home Medications on Admission:  Current Outpatient Prescriptions:  .  aspirin EC 81 MG tablet, Take 81 mg by mouth daily., Disp: , Rfl:  .  atorvastatin (LIPITOR) 40 MG tablet, Take 1 tablet (40 mg total) by mouth daily., Disp: 30 tablet, Rfl: 11 .  B-D ULTRAFINE III SHORT PEN 31G X 8 MM MISC, USE 4 TIMES DAILY AS DIRECTED, Disp: 100 each, Rfl: 0 .  ferrous gluconate (FERGON) 324 MG tablet, Take 1 tablet (324 mg total) by mouth 2 (two) times daily with a meal., Disp: 60 tablet, Rfl: 1 .  glucose blood test strip, Use as instructed, Disp: 100 each, Rfl: 12 .  Insulin Glargine (LANTUS SOLOSTAR) 100 UNIT/ML Solostar Pen, Inject 15 Units into the skin daily at 10 pm. (Patient taking differently: Inject 17 Units into the skin daily at 10 pm. ), Disp: 5 pen, Rfl: 5 .  metFORMIN (GLUCOPHAGE) 1000 MG tablet, Take 1 tablet (1,000 mg total) by mouth 2 (two) times daily with a meal., Disp: 180 tablet, Rfl: 1 .  folic acid (FOLVITE) 1 MG tablet, Take 1 tablet (1 mg total) by mouth daily. (Patient not taking: Reported on 02/16/2016), Disp: 30 tablet, Rfl: 1 .  metoprolol tartrate (LOPRESSOR) 25 MG tablet, Take 0.5 tablets (12.5 mg total) by mouth 2 (two) times daily. (Patient not taking: Reported on 02/16/2016), Disp: 60 tablet, Rfl: 0 .  ondansetron (ZOFRAN ODT) 4 MG disintegrating tablet, Take 1 tablet (4 mg total) by mouth every 8 (eight)  hours as needed for nausea or vomiting. (Patient not taking: Reported on 02/16/2016), Disp: 30 tablet, Rfl: 0  Past Medical History: Past Medical History:  Diagnosis Date  . 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 Carpal tunnel syndrome 09/24/2014   Bilateral  . 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 Diabetes mellitus, type 2 ([place])   . Erectile dysfunction   . Essential hypertension   . Hyperlipidemia     Tobacco Use: History  Smoking Status  . Never Smoker  Smokeless Tobacco  . Never Used    Labs: Recent Review Flowsheet Data    Labs for ITP Cardiac and Pulmonary Rehab Latest Ref Rng & Units 10/30/2015 10/30/2015 10/30/2015 10/31/2015 12/06/2015   Cholestrol 125 - 200 mg/dL - - - - -   LDLCALC <130 mg/dL - - - - -   LDLDIRECT mg/dL - - - - -   HDL >=40 mg/dL - - - - -   Trlycerides <150 mg/dL - - - - -   Hemoglobin A1c 4.8 - 5.6 % - - - - -   PHART 7.350 - 7.450 7.343(L) - - - -   PCO2ART 32.0 - 48.0 mmHg 38.8 - - - -   HCO3 20.0 - 28.0 mmol/L 21.2 23.0 - - -   TCO2 0 - 100 mmol/L 22 24  23 25 28    ACIDBASEDEF 0.0 - 2.0 mmol/L 4.0(H) 2.0 - - -   O2SAT % 96.0 98.0 - - -      Capillary Blood Glucose: Lab Results  Component Value Date   GLUCAP 184 (H) 02/16/2016   GLUCAP 144 (H) 02/16/2016   GLUCAP 187 (H) 12/11/2015   GLUCAP 143 (H) 12/11/2015   GLUCAP 169 (H) 12/10/2015     Exercise Target Goals:    Exercise Program Goal: Individual exercise prescription set with THRR, safety & activity barriers. Participant demonstrates ability to understand and report RPE using BORG scale, to self-measure pulse accurately, and to acknowledge the importance of the exercise prescription.  Exercise Prescription Goal: Starting with aerobic activity 30 plus minutes a day, 3 days per week for initial exercise prescription. Provide home exercise prescription and guidelines that participant acknowledges understanding prior to  discharge.  Activity Barriers & Risk Stratification:     Activity Barriers & Cardiac Risk Stratification - 02/01/16 1634      Activity Barriers & Cardiac Risk Stratification   Activity Barriers None   Cardiac Risk Stratification High      6 Minute Walk:     6 Minute Walk    Row Name 02/01/16 1635 02/01/16 1638       6 Minute Walk   Phase Initial  -    Distance 1116 feet  -    Walk Time 6 minutes  -    # of Rest Breaks 0  -    MPH  - 2.12    METS  - 3.6    RPE 11  -    VO2 Peak  - 12.74    Symptoms No  -    Resting HR 83 bpm  -    Resting BP 114/70  -    Max Ex. HR 99 bpm  -    Max Ex. BP 108/70  -    2 Minute Post BP 108/64  -       Initial Exercise Prescription:     Initial Exercise Prescription - 02/01/16 1600      Date of Initial Exercise RX and Referring Provider   Date 02/01/16   Referring Provider [name], Philip MD     Treadmill   MPH 2   Grade 0   Minutes 10   METs 2.53     Bike   Level 2  upright scifit bike   Minutes 10   METs 2     NuStep   Level 2   Minutes 10   METs 2     Prescription Details   Frequency (times per week) 3   Duration Progress to 30 minutes of continuous aerobic without signs/symptoms of physical distress     Intensity   THRR 40-80% of Max Heartrate 61-122   Ratings of Perceived Exertion 11-13   Perceived Dyspnea 0-4     Progression   Progression Continue progressive overload as per policy without signs/symptoms or physical distress.     Resistance Training   Training Prescription Yes   Weight 2lbs   Reps 10-12      Perform Capillary Blood Glucose checks as needed.  Exercise Prescription Changes:   Exercise Comments:   Discharge Exercise Prescription (Final Exercise Prescription Changes):   Nutrition:  Target Goals: Understanding of nutrition guidelines, daily intake of sodium 1500mg , cholesterol 200mg , calories 30% from fat and 7% or less from saturated fats, daily to have 5 or more servings  of fruits and  vegetables.  Biometrics:     Pre Biometrics - 02/01/16 1636      Pre Biometrics   Waist Circumference 34 inches   Hip Circumference 37.5 inches   Waist to Hip Ratio 0.91 %   Triceps Skinfold 17 mm   % Body Fat 24.1 %   Grip Strength 41 kg   Flexibility 7.5 in   Single Leg Stand 30 seconds       Nutrition Therapy Plan and Nutrition Goals:   Nutrition Discharge: Nutrition Scores:   Nutrition Goals Re-Evaluation:   Psychosocial: Target Goals: Acknowledge presence or absence of depression, maximize coping skills, provide positive support system. Participant is able to verbalize types and ability to use techniques and skills needed for reducing stress and depression.  Initial Review & Psychosocial Screening:     Initial Psych Review & Screening - 02/01/16 [place]? Yes   Comments  brief psychosocial assesment reveals no furhter intervention needed at this time     Barriers   Psychosocial barriers to participate in program There are no identifiable barriers or psychosocial needs.     Screening Interventions   Interventions Encouraged to exercise      Quality of Life Scores:     Quality of Life - 02/01/16 1355      Quality of Life Scores   Health/Function Pre 25.6 %   Socioeconomic Pre 27 %   Psych/Spiritual Pre 29.14 %   Family Pre 16.8 %   GLOBAL Pre 25.27 %      PHQ-9: Recent Review Flowsheet Data    Depression screen [facility] 2/9 02/16/2016 04/03/2014 03/03/2013   Decreased Interest 0 0 0   Down, Depressed, Hopeless 0 0 0   PHQ - 2 Score 0 0 0      Psychosocial Evaluation and Intervention:   Psychosocial Re-Evaluation:   Vocational Rehabilitation: Provide vocational rehab assistance to qualifying candidates.   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 Participant will state understanding/return demonstration of topics presented.  Learning Barriers/Preferences:     Learning Barriers/Preferences - 02/01/16 1635      Learning Barriers/Preferences   Learning Barriers Sight   Learning Preferences Audio;Video      Education Topics: Count Your Pulse:  -Group instruction provided by verbal instruction, demonstration, patient participation and written materials to support subject.  Instructors address importance of being able to find your pulse and how to count your pulse when at home without a heart monitor.  Patients get hands on experience counting their pulse with staff help and individually.   Heart Attack, Angina, and Risk Factor Modification:  -Group instruction provided by verbal instruction, video, and written materials to support subject.  Instructors address signs and symptoms of angina and heart attacks.    Also discuss risk factors for heart disease and how to make changes to improve heart health risk factors.   Functional Fitness:  -Group instruction provided by verbal instruction, demonstration, patient participation, and written materials to support subject.  Instructors address safety measures for doing things around the house.  Discuss how to  get up and down off the floor, how to pick things up properly, how to safely get out of a chair without assistance, and balance training.   Meditation and Mindfulness:  -Group instruction provided by verbal instruction, patient participation, and written materials to support subject.  Instructor addresses importance of mindfulness and meditation practice to help reduce stress and improve awareness.  Instructor also leads participants through a meditation exercise.     Stretching for Flexibility and Mobility:  -Group instruction provided by verbal instruction, patient participation, and written materials to support subject.  Instructors lead participants through series of stretches that are designed to increase flexibility thus improving mobility.  These stretches are additional exercise for major muscle groups that are typically performed during regular warm up and cool down.   Hands Only CPR Anytime:  -Group instruction provided by verbal instruction, video, patient participation and written materials to support subject.  Instructors co-teach with AHA video for hands only CPR.  Participants get hands on experience with mannequins.   Nutrition I class: Heart Healthy Eating:  -Group instruction provided by PowerPoint slides, verbal discussion, and written materials to support subject matter. The instructor gives an explanation and review of the Therapeutic Lifestyle Changes diet recommendations, which includes a discussion on lipid goals, dietary fat, sodium, fiber, plant stanol/sterol esters, sugar, and the components of a well-balanced, healthy diet.   Nutrition II class: Lifestyle Skills:  -Group instruction provided by PowerPoint slides, verbal discussion, and written materials to support subject matter. The instructor gives an explanation and review of label reading, grocery shopping for heart health, heart healthy recipe modifications, and ways to make healthier choices when eating out.   Diabetes Question & Answer:  -Group instruction provided by PowerPoint slides, verbal discussion, and written materials to support subject matter. The instructor gives an explanation and review of diabetes co-morbidities, pre- and post-prandial blood glucose goals, pre-exercise blood glucose goals, signs, symptoms, and treatment of hypoglycemia and hyperglycemia, and foot care basics.   Diabetes Blitz:  -Group instruction provided by PowerPoint slides, verbal  discussion, and written materials to support subject matter. The instructor gives an explanation and review of the physiology behind type 1 and type 2 diabetes, diabetes medications and rational behind using different medications, pre- and post-prandial blood glucose recommendations and Hemoglobin A1c goals, diabetes diet, and exercise including blood glucose guidelines for exercising safely.    Portion Distortion:  -Group instruction provided by PowerPoint slides, verbal discussion, written materials, and food models to support subject matter. The instructor gives an explanation of serving size versus portion size, changes in portions sizes over the last 20 years, and what consists of a serving from each food group.   Stress Management:  -Group instruction provided by verbal instruction, video, and written materials to support subject matter.  Instructors review role of stress in heart disease and how to cope with stress positively.     Exercising on Your Own:  -Group instruction provided by verbal instruction, power point, and written materials to support subject.  Instructors discuss benefits of exercise, components of exercise, frequency and intensity of exercise, and end points for exercise.  Also discuss use of nitroglycerin and activating EMS.  Review options of places to exercise outside of rehab.  Review guidelines for sex with heart disease.   Cardiac Drugs I:  -Group instruction provided by verbal instruction and written materials to support subject.  Instructor reviews cardiac drug classes: antiplatelets, anticoagulants, beta blockers, and statins.  Instructor discusses reasons, side effects, and  lifestyle considerations for each drug class.   Cardiac Drugs II:  -Group instruction provided by verbal instruction and written materials to support subject.  Instructor reviews cardiac drug classes: angiotensin converting enzyme inhibitors (ACE-I), angiotensin II receptor blockers (ARBs),  nitrates, and calcium channel blockers.  Instructor discusses reasons, side effects, and lifestyle considerations for each drug class.   Anatomy and Physiology of the Circulatory System:  -Group instruction provided by verbal instruction, video, and written materials to support subject.  Reviews functional anatomy of heart, how it relates to various diagnoses, and what role the heart plays in the overall system.   Knowledge Questionnaire Score:     Knowledge Questionnaire Score - 02/01/16 1354      Knowledge Questionnaire Score   Pre Score 19/24      Core Components/Risk Factors/Patient Goals at Admission:     Personal Goals and Risk Factors at Admission - 02/01/16 1723      Core Components/Risk Factors/Patient Goals on Admission    Weight Management Yes;Weight Loss   Intervention Weight Management/Obesity: Establish reasonable short term and long term weight goals.;Weight Management: Develop a combined nutrition and exercise program designed to reach desired caloric intake, while maintaining appropriate intake of nutrient and fiber, sodium and fats, and appropriate energy expenditure required for the weight goal.;Weight Management: Provide education and appropriate resources to help participant work on and attain dietary goals.;Obesity: Provide education and appropriate resources to help participant work on and attain dietary goals.   Expected Outcomes Short Term: Continue to assess and modify interventions until short term weight is achieved;Long Term: Adherence to nutrition and physical activity/exercise program aimed toward attainment of established weight goal;Weight Maintenance: Understanding of the daily nutrition guidelines, which includes 25-35% calories from fat, 7% or less cal from saturated fats, less than 200mg  cholesterol, less than 1.5gm of sodium, & 5 or more servings of fruits and vegetables daily;Weight Loss: Understanding of general recommendations for a balanced deficit  meal plan, which promotes 1-2 lb weight loss per week and includes a negative energy balance of [phone] kcal/d;Understanding recommendations for meals to include 15-35% energy as protein, 25-35% energy from fat, 35-60% energy from carbohydrates, less than 200mg  of dietary cholesterol, 20-35 gm of total fiber daily;Understanding of distribution of calorie intake throughout the day with the consumption of 4-5 meals/snacks   Increase Strength and Stamina Yes   Intervention Provide advice, education, support and counseling about physical activity/exercise needs.;Develop an individualized exercise prescription for aerobic and resistive training based on initial evaluation findings, risk stratification, comorbidities and participant's personal goals.   Expected Outcomes Achievement of increased cardiorespiratory fitness and enhanced flexibility, muscular endurance and strength shown through measurements of functional capacity and personal statement of participant.   Diabetes Yes   Intervention Provide education about signs/symptoms and action to take for hypo/hyperglycemia.;Provide education about proper nutrition, including hydration, and aerobic/resistive exercise prescription along with prescribed medications to achieve blood glucose in normal ranges: Fasting glucose 65-99 mg/dL   Expected Outcomes Short Term: Participant verbalizes understanding of the signs/symptoms and immediate care of hyper/hypoglycemia, proper foot care and importance of medication, aerobic/resistive exercise and nutrition plan for blood glucose control.;Long Term: Attainment of HbA1C < 7%.   Hypertension Yes   Intervention Provide education on lifestyle modifcations including regular physical activity/exercise, weight management, moderate sodium restriction and increased consumption of fresh fruit, vegetables, and low fat dairy, alcohol moderation, and smoking cessation.;Monitor prescription use compliance.   Expected Outcomes Short  Term: Continued assessment and intervention until BP is <  140/87mm HG in hypertensive participants. < 130/80mm HG in hypertensive participants with diabetes, heart failure or chronic kidney disease.;Long Term: Maintenance of blood pressure at goal levels.   Lipids Yes   Intervention Provide education and support for participant on nutrition & aerobic/resistive exercise along with prescribed medications to achieve LDL 70mg , HDL >40mg .   Expected Outcomes Short Term: Participant states understanding of desired cholesterol values and is compliant with medications prescribed. Participant is following exercise prescription and nutrition guidelines.   Personal Goal Other Yes   Personal Goal Get better and improve heart health   Intervention Provide exercise programming to assist with improving cardiovascular fitness and functional capacity.   Expected Outcomes Pt will be able to increase exercise tolerance and improve heart health      Core Components/Risk Factors/Patient Goals Review:    Core Components/Risk Factors/Patient Goals at Discharge (Final Review):    ITP Comments:     ITP Comments    Row Name 02/01/16 1640           ITP Comments Dr. Fransico Him, Medical Director          Comments: Sedrick is making expected progress toward personal goals after completing 2 sessions. Recommend continued exercise and life style modification education including  stress management and relaxation techniques to decrease cardiac risk profile. [name], RN,BSN 02/17/2016 12:12 PM

## 2016-02-18 ENCOUNTER — Encounter (HOSPITAL_COMMUNITY)
Admission: RE | Admit: 2016-02-18 | Discharge: 2016-02-18 | Disposition: A | Discharge status: Home / Self Care | Payer: Medicare Other | Source: Ambulatory Visit | Attending: Cardiovascular Disease | Admitting: Cardiovascular Disease

## 2016-02-18 DIAGNOSIS — Z951 Presence of aortocoronary bypass graft: Secondary | ICD-10-CM

## 2016-02-18 DIAGNOSIS — Z952 Presence of prosthetic heart valve: Secondary | ICD-10-CM

## 2016-02-18 LAB — GLUCOSE, CAPILLARY
GLUCOSE-CAPILLARY: 143 mg/dL — AB (ref 65–99)
Glucose-Capillary: 129 mg/dL — ABNORMAL HIGH (ref 65–99)

## 2016-02-18 NOTE — Progress Notes (Signed)
.   Mr [name] brought his bottles in for verification. [name] is taking his metoprolol as prescribed.Will continue to monitor the patient throughout  the [place] RN BSN

## 2016-02-21 ENCOUNTER — Encounter (HOSPITAL_COMMUNITY): Payer: Medicare Other

## 2016-02-23 ENCOUNTER — Encounter (HOSPITAL_COMMUNITY)
Admission: RE | Admit: 2016-02-23 | Discharge: 2016-02-23 | Disposition: A | Discharge status: Home / Self Care | Payer: Medicare Other | Source: Ambulatory Visit | Attending: Cardiovascular Disease | Admitting: Cardiovascular Disease

## 2016-02-23 DIAGNOSIS — Z951 Presence of aortocoronary bypass graft: Secondary | ICD-10-CM

## 2016-02-23 DIAGNOSIS — Z952 Presence of prosthetic heart valve: Secondary | ICD-10-CM

## 2016-02-25 ENCOUNTER — Encounter (HOSPITAL_COMMUNITY)
Admission: RE | Admit: 2016-02-25 | Discharge: 2016-02-25 | Disposition: A | Discharge status: Home / Self Care | Payer: Medicare Other | Source: Ambulatory Visit | Attending: Cardiovascular Disease | Admitting: Cardiovascular Disease

## 2016-02-25 ENCOUNTER — Telehealth (HOSPITAL_COMMUNITY): Payer: Self-pay | Admitting: Adult Health

## 2016-02-25 DIAGNOSIS — Z951 Presence of aortocoronary bypass graft: Secondary | ICD-10-CM | POA: Diagnosis not present

## 2016-02-25 DIAGNOSIS — Z952 Presence of prosthetic heart valve: Secondary | ICD-10-CM

## 2016-02-25 LAB — GLUCOSE, CAPILLARY
Glucose-Capillary: 110 mg/dL — ABNORMAL HIGH (ref 65–99)
Glucose-Capillary: 153 mg/dL — ABNORMAL HIGH (ref 65–99)

## 2016-02-25 NOTE — Telephone Encounter (Signed)
S/w Robert at [facility] verifying [employer].  [phone] co-pay, no Deductible, $4,000 out of pocket per year with 36 visits a year, no limits reference #[id number].[name] Kitchen... KJ

## 2016-02-28 ENCOUNTER — Encounter (HOSPITAL_COMMUNITY)
Admission: RE | Admit: 2016-02-28 | Discharge: 2016-02-28 | Disposition: A | Discharge status: Home / Self Care | Payer: Medicare Other | Source: Ambulatory Visit | Attending: Cardiovascular Disease | Admitting: Cardiovascular Disease

## 2016-02-28 DIAGNOSIS — Z952 Presence of prosthetic heart valve: Secondary | ICD-10-CM

## 2016-02-28 DIAGNOSIS — Z951 Presence of aortocoronary bypass graft: Secondary | ICD-10-CM

## 2016-02-28 LAB — GLUCOSE, CAPILLARY: Glucose-Capillary: 181 mg/dL — ABNORMAL HIGH (ref 65–99)

## 2016-02-28 NOTE — Progress Notes (Signed)
Reviewed home exercise program with pt.  Discussed mode/frequency of exercise, THRR, RPE scale and weather conditions for exercising outdoors.  Also discussed signs and symptoms and when to call Dr./11.  Pt verbalized understanding.  Cleda Mccreedy, [place] 02/28/2016 16:22

## 2016-02-28 NOTE — Progress Notes (Signed)
[name] reported that he became lightheaded and fell in his closet Sunday morning before going to church. [name] said he did not sustain any injury and did not pass out. [name] said he did not check his blood sugar at the time. [name] denies having any symptoms today.  Blood pressure 118/60. CBG 181. Oxygen saturation 98% on room air. Cecilie Kicks NP called and notified. [name] said it is okay for [name] to exercise. [name] was instructed to call Dr Elmarie Shiley office if he has any more symptoms. [name] exercise without complaints or symptoms. Will continue to monitor the patient throughout  the program.

## 2016-02-28 NOTE — Progress Notes (Signed)
Quality of life questionnaire reviewed with the patient .  Ediel scored low in the family area. [name] said he was worried about his son's health and things are better now. Vikrant is enjoying participating in cardiac rehab. Will continue to monitor the patient.[name], RN,BSN 02/28/2016 4:29 PM

## 2016-03-01 ENCOUNTER — Encounter (HOSPITAL_COMMUNITY): Payer: Medicare Other

## 2016-03-03 ENCOUNTER — Encounter (HOSPITAL_COMMUNITY)
Admission: RE | Admit: 2016-03-03 | Discharge: 2016-03-03 | Disposition: A | Discharge status: Home / Self Care | Payer: Medicare Other | Source: Ambulatory Visit | Attending: Cardiovascular Disease | Admitting: Cardiovascular Disease

## 2016-03-03 ENCOUNTER — Other Ambulatory Visit: Payer: Self-pay | Admitting: Cardiothoracic Surgery

## 2016-03-03 DIAGNOSIS — I712 Thoracic aortic aneurysm, without rupture, unspecified: Secondary | ICD-10-CM

## 2016-03-03 DIAGNOSIS — Z952 Presence of prosthetic heart valve: Secondary | ICD-10-CM

## 2016-03-03 DIAGNOSIS — Z951 Presence of aortocoronary bypass graft: Secondary | ICD-10-CM

## 2016-03-06 ENCOUNTER — Encounter (HOSPITAL_COMMUNITY)
Admission: RE | Admit: 2016-03-06 | Discharge: 2016-03-06 | Disposition: A | Discharge status: Home / Self Care | Payer: Medicare Other | Source: Ambulatory Visit | Attending: Cardiovascular Disease | Admitting: Cardiovascular Disease

## 2016-03-06 DIAGNOSIS — Z952 Presence of prosthetic heart valve: Secondary | ICD-10-CM

## 2016-03-06 DIAGNOSIS — Z951 Presence of aortocoronary bypass graft: Secondary | ICD-10-CM | POA: Diagnosis not present

## 2016-03-08 ENCOUNTER — Encounter (HOSPITAL_COMMUNITY)
Admission: RE | Admit: 2016-03-08 | Discharge: 2016-03-08 | Disposition: A | Discharge status: Home / Self Care | Payer: Medicare Other | Source: Ambulatory Visit | Attending: Cardiovascular Disease | Admitting: Cardiovascular Disease

## 2016-03-08 DIAGNOSIS — Z951 Presence of aortocoronary bypass graft: Secondary | ICD-10-CM

## 2016-03-08 DIAGNOSIS — Z952 Presence of prosthetic heart valve: Secondary | ICD-10-CM

## 2016-03-10 ENCOUNTER — Encounter (HOSPITAL_COMMUNITY)
Admission: RE | Admit: 2016-03-10 | Discharge: 2016-03-10 | Disposition: A | Discharge status: Home / Self Care | Payer: Medicare Other | Source: Ambulatory Visit | Attending: Cardiovascular Disease | Admitting: Cardiovascular Disease

## 2016-03-10 DIAGNOSIS — Z951 Presence of aortocoronary bypass graft: Secondary | ICD-10-CM | POA: Diagnosis not present

## 2016-03-10 DIAGNOSIS — Z952 Presence of prosthetic heart valve: Secondary | ICD-10-CM

## 2016-03-10 LAB — GLUCOSE, CAPILLARY: GLUCOSE-CAPILLARY: 94 mg/dL (ref 65–99)

## 2016-03-13 ENCOUNTER — Encounter (HOSPITAL_COMMUNITY)
Admission: RE | Admit: 2016-03-13 | Discharge: 2016-03-13 | Disposition: A | Discharge status: Home / Self Care | Payer: Medicare Other | Source: Ambulatory Visit | Attending: Cardiovascular Disease | Admitting: Cardiovascular Disease

## 2016-03-13 DIAGNOSIS — Z951 Presence of aortocoronary bypass graft: Secondary | ICD-10-CM | POA: Diagnosis not present

## 2016-03-13 DIAGNOSIS — Z952 Presence of prosthetic heart valve: Secondary | ICD-10-CM

## 2016-03-15 ENCOUNTER — Encounter (HOSPITAL_COMMUNITY)
Admission: RE | Admit: 2016-03-15 | Discharge: 2016-03-15 | Disposition: A | Discharge status: Home / Self Care | Payer: Medicare Other | Source: Ambulatory Visit | Attending: Cardiovascular Disease | Admitting: Cardiovascular Disease

## 2016-03-15 DIAGNOSIS — Z951 Presence of aortocoronary bypass graft: Secondary | ICD-10-CM

## 2016-03-15 DIAGNOSIS — Z952 Presence of prosthetic heart valve: Secondary | ICD-10-CM

## 2016-03-15 NOTE — Progress Notes (Signed)
[name] 71 y.o. male Nutrition Note Spoke with pt. Nutrition Plan and Nutrition Survey goals reviewed with pt. Pt is following Step 2 of the Therapeutic Lifestyle Changes diet. Pt wants to maintain his current wt. Pt  is diabetic. Last A1c per NP note was 6.3 and indicates blood glucose well-controlled. Pt feels CBG's better controlled since he decreased his portion sizes consumed and is exercising regularly. Pt checks CBG's 3-4 times a day; usually before meals and 2-hr post-prandial. Pt expressed understanding of the information reviewed. Pt aware of nutrition education classes offered.  Lab Results  Component Value Date   HGBA1C 11.2 (H) 10/29/2015   Wt Readings from Last 3 Encounters:  02/01/16 179 lb 10.8 oz (81.5 kg)  01/20/16 177 lb 8 oz (80.5 kg)  12/30/15 176 lb (79.8 kg)   Nutrition Diagnosis ? Food-and nutrition-related knowledge deficit related to lack of exposure to information as related to diagnosis of: ? CVD ? DM Nutrition Intervention ? Pt's individual nutrition plan reviewed with pt. ? Benefits of adopting Therapeutic Lifestyle Changes discussed when Medficts reviewed. ? Pt to attend the Portion Distortion class ? Pt to attend the Diabetes Q & A class ? Pt given handouts for: ? Nutrition I class ? Nutrition II class ? Diabetes Blitz Class ? Continue client-centered nutrition education by RD, as part of interdisciplinary care. Goal(s) ? Pt to identify food quantities necessary to achieve weight loss of 6-24 lb (2.7-10.9 kg) at graduation from cardiac rehab.  ? Use pre-meal and post-meal CBG's and A1c to determine whether adjustments in food/meal planning will be beneficial or if any meds need to be combined with nutrition therapy. Monitor and Evaluate progress toward nutrition goal with team. [name], M.Ed, RD, LDN, CDE 03/15/2016 4:20 PM

## 2016-03-17 ENCOUNTER — Encounter (HOSPITAL_COMMUNITY)
Admission: RE | Admit: 2016-03-17 | Discharge: 2016-03-17 | Disposition: A | Discharge status: Home / Self Care | Payer: Medicare Other | Source: Ambulatory Visit | Attending: Cardiovascular Disease | Admitting: Cardiovascular Disease

## 2016-03-17 DIAGNOSIS — Z951 Presence of aortocoronary bypass graft: Secondary | ICD-10-CM

## 2016-03-17 DIAGNOSIS — Z952 Presence of prosthetic heart valve: Secondary | ICD-10-CM

## 2016-03-17 NOTE — Progress Notes (Signed)
Cardiac Individual Treatment Plan  Patient Details  Name: [name] MRN: [id number] Date of Birth: January 15, 1946 Referring Provider:   Flowsheet Row CARDIAC REHAB PHASE II ORIENTATION from 02/01/2016 in [place]  Referring Provider  [name], Arnette Norris MD      Initial Encounter Date:  [place] PHASE II ORIENTATION from 02/01/2016 in [place]  Date  02/01/16  Referring Provider  [name], Arnette Norris MD      Visit Diagnosis: S/P AVR (aortic valve replacement)  S/P CABG x 4  Patient's Home Medications on Admission:  Current Outpatient Prescriptions:  .  aspirin EC 81 MG tablet, Take 81 mg by mouth daily., Disp: , Rfl:  .  atorvastatin (LIPITOR) 40 MG tablet, Take 1 tablet (40 mg total) by mouth daily., Disp: 30 tablet, Rfl: 11 .  B-D ULTRAFINE III SHORT PEN 31G X 8 MM MISC, USE 4 TIMES DAILY AS DIRECTED, Disp: 100 each, Rfl: 0 .  ferrous gluconate (FERGON) 324 MG tablet, Take 1 tablet (324 mg total) by mouth 2 (two) times daily with a meal., Disp: 60 tablet, Rfl: 1 .  folic acid (FOLVITE) 1 MG tablet, Take 1 tablet (1 mg total) by mouth daily. (Patient not taking: Reported on 02/18/2016), Disp: 30 tablet, Rfl: 1 .  glucose blood test strip, Use as instructed, Disp: 100 each, Rfl: 12 .  Insulin Glargine (LANTUS SOLOSTAR) 100 UNIT/ML Solostar Pen, Inject 15 Units into the skin daily at 10 pm. (Patient taking differently: Inject 17 Units into the skin daily at 10 pm. ), Disp: 5 pen, Rfl: 5 .  metFORMIN (GLUCOPHAGE) 1000 MG tablet, Take 1 tablet (1,000 mg total) by mouth 2 (two) times daily with a meal., Disp: 180 tablet, Rfl: 1 .  metoprolol tartrate (LOPRESSOR) 25 MG tablet, Take 0.5 tablets (12.5 mg total) by mouth 2 (two) times daily., Disp: 60 tablet, Rfl: 0 .  ondansetron (ZOFRAN ODT) 4 MG disintegrating tablet, Take 1 tablet (4 mg total) by mouth every 8 (eight) hours as needed for nausea or vomiting., Disp:  30 tablet, Rfl: 0  Past Medical History: Past Medical History:  Diagnosis Date  . 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 Carpal tunnel syndrome 09/24/2014   Bilateral  . 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 Diabetes mellitus, type 2 ([place])   . Erectile dysfunction   . Essential hypertension   . Hyperlipidemia     Tobacco Use: History  Smoking Status  . Never Smoker  Smokeless Tobacco  . Never Used    Labs: Recent Review Flowsheet Data    Labs for ITP Cardiac and Pulmonary Rehab Latest Ref Rng & Units 10/30/2015 10/30/2015 10/30/2015 10/31/2015 12/06/2015   Cholestrol 125 - 200 mg/dL - - - - -   LDLCALC <130 mg/dL - - - - -   LDLDIRECT mg/dL - - - - -   HDL >=40 mg/dL - - - - -   Trlycerides <150 mg/dL - - - - -   Hemoglobin A1c 4.8 - 5.6 % - - - - -   PHART 7.350 - 7.450 7.343(L) - - - -   PCO2ART 32.0 - 48.0 mmHg 38.8 - - - -   HCO3 20.0 - 28.0 mmol/L 21.2 23.0 - - -   TCO2 0 - 100 mmol/L 22 24 23 25 28    ACIDBASEDEF 0.0 - 2.0 mmol/L 4.0(H) 2.0 - - -  O2SAT % 96.0 98.0 - - -      Capillary Blood Glucose: Lab Results  Component Value Date   GLUCAP 94 03/10/2016   GLUCAP 181 (H) 02/28/2016   GLUCAP 153 (H) 02/25/2016   GLUCAP 110 (H) 02/25/2016   GLUCAP 129 (H) 02/18/2016     Exercise Target Goals:    Exercise Program Goal: Individual exercise prescription set with THRR, safety & activity barriers. Participant demonstrates ability to understand and report RPE using BORG scale, to self-measure pulse accurately, and to acknowledge the importance of the exercise prescription.  Exercise Prescription Goal: Starting with aerobic activity 30 plus minutes a day, 3 days per week for initial exercise prescription. Provide home exercise prescription and guidelines that participant acknowledges understanding prior to discharge.  Activity Barriers & Risk Stratification:     Activity Barriers & Cardiac Risk  Stratification - 02/01/16 1634      Activity Barriers & Cardiac Risk Stratification   Activity Barriers None   Cardiac Risk Stratification High      6 Minute Walk:     6 Minute Walk    Row Name 02/01/16 1635 02/01/16 1638       6 Minute Walk   Phase Initial  -    Distance 1116 feet  -    Walk Time 6 minutes  -    # of Rest Breaks 0  -    MPH  - 2.12    METS  - 3.6    RPE 11  -    VO2 Peak  - 12.74    Symptoms No  -    Resting HR 83 bpm  -    Resting BP 114/70  -    Max Ex. HR 99 bpm  -    Max Ex. BP 108/70  -    2 Minute Post BP 108/64  -       Initial Exercise Prescription:     Initial Exercise Prescription - 02/01/16 1600      Date of Initial Exercise RX and Referring Provider   Date 02/01/16   Referring Provider [name], Philip MD     Treadmill   MPH 2   Grade 0   Minutes 10   METs 2.53     Bike   Level 2  upright scifit bike   Minutes 10   METs 2     NuStep   Level 2   Minutes 10   METs 2     Prescription Details   Frequency (times per week) 3   Duration Progress to 30 minutes of continuous aerobic without signs/symptoms of physical distress     Intensity   THRR 40-80% of Max Heartrate 61-122   Ratings of Perceived Exertion 11-13   Perceived Dyspnea 0-4     Progression   Progression Continue progressive overload as per policy without signs/symptoms or physical distress.     Resistance Training   Training Prescription Yes   Weight 2lbs   Reps 10-12      Perform Capillary Blood Glucose checks as needed.  Exercise Prescription Changes:     Exercise Prescription Changes    Row Name 03/13/16 1600             Response to Exercise   Blood Pressure (Admit) 118/68       Blood Pressure (Exercise) 148/68       Blood Pressure (Exit) 104/64       Heart Rate (Admit) 91 bpm  Heart Rate (Exercise) 126 bpm       Heart Rate (Exit) 96 bpm       Rating of Perceived Exertion (Exercise) 9       Duration Progress to 45 minutes of  aerobic exercise without signs/symptoms of physical distress       Intensity THRR unchanged         Progression   Progression Continue to progress workloads to maintain intensity without signs/symptoms of physical distress.       Average METs 5.9         Resistance Training   Training Prescription Yes       Weight 6lb       Reps 10-12         Treadmill   MPH 3       Grade 2       Minutes 10       METs 4.12         Bike   Level 3.5  upright scifit bike       Minutes 10       METs 6.3         NuStep   Level 4       Minutes 10       METs 7.2         Home Exercise Plan   Plans to continue exercise at Home       Frequency Add 4 additional days to program exercise sessions.          Exercise Comments:     Exercise Comments    Row Name 03/13/16 1622           Exercise Comments Reviewed METs and goals with pt.  Pt is dong great with exercise!          Discharge Exercise Prescription (Final Exercise Prescription Changes):     Exercise Prescription Changes - 03/13/16 1600      Response to Exercise   Blood Pressure (Admit) 118/68   Blood Pressure (Exercise) 148/68   Blood Pressure (Exit) 104/64   Heart Rate (Admit) 91 bpm   Heart Rate (Exercise) 126 bpm   Heart Rate (Exit) 96 bpm   Rating of Perceived Exertion (Exercise) 9   Duration Progress to 45 minutes of aerobic exercise without signs/symptoms of physical distress   Intensity THRR unchanged     Progression   Progression Continue to progress workloads to maintain intensity without signs/symptoms of physical distress.   Average METs 5.9     Resistance Training   Training Prescription Yes   Weight 6lb   Reps 10-12     Treadmill   MPH 3   Grade 2   Minutes 10   METs 4.12     Bike   Level 3.5  upright scifit bike   Minutes 10   METs 6.3     NuStep   Level 4   Minutes 10   METs 7.2     Home Exercise Plan   Plans to continue exercise at Home   Frequency Add 4 additional days to program  exercise sessions.      Nutrition:  Target Goals: Understanding of nutrition guidelines, daily intake of sodium 1500mg , cholesterol 200mg , calories 30% from fat and 7% or less from saturated fats, daily to have 5 or more servings of fruits and vegetables.  Biometrics:     Pre Biometrics - 02/01/16 1636      Pre Biometrics   Waist Circumference 34 inches  Hip Circumference 37.5 inches   Waist to Hip Ratio 0.91 %   Triceps Skinfold 17 mm   % Body Fat 24.1 %   Grip Strength 41 kg   Flexibility 7.5 in   Single Leg Stand 30 seconds       Nutrition Therapy Plan and Nutrition Goals:     Nutrition Therapy & Goals - 03/15/16 1617      Nutrition Therapy   Diet Carb Modified, Therapeutic Lifestyle Changes     Personal Nutrition Goals   Personal Goal #1 Maintain wt while in [place], educate and counsel regarding individualized specific dietary modifications aiming towards targeted core components such as weight, hypertension, lipid management, diabetes, heart failure and other comorbidities.   Expected Outcomes Short Term Goal: Understand basic principles of dietary content, such as calories, fat, sodium, cholesterol and nutrients.;Long Term Goal: Adherence to prescribed nutrition plan.      Nutrition Discharge: Nutrition Scores:     Nutrition Assessments - 03/15/16 1617      MEDFICTS Scores   Pre Score 32      Nutrition Goals Re-Evaluation:   Psychosocial: Target Goals: Acknowledge presence or absence of depression, maximize coping skills, provide positive support system. Participant is able to verbalize types and ability to use techniques and skills needed for reducing stress and depression.  Initial Review & Psychosocial Screening:     Initial Psych Review & Screening - 02/01/16 [place]? Yes   Comments  brief psychosocial assesment reveals no furhter intervention  needed at this time     Barriers   Psychosocial barriers to participate in program There are no identifiable barriers or psychosocial needs.     Screening Interventions   Interventions Encouraged to exercise      Quality of Life Scores:     Quality of Life - 02/28/16 1629      Quality of Life Scores   Family Pre --  worried about Son's HTN says he is better now.      PHQ-9: Recent Review Flowsheet Data    Depression screen [facility] 2/9 02/16/2016 04/03/2014 03/03/2013   Decreased Interest 0 0 0   Down, Depressed, Hopeless 0 0 0   PHQ - 2 Score 0 0 0      Psychosocial Evaluation and Intervention:   Psychosocial Re-Evaluation:     Psychosocial Re-Evaluation    Row Name 03/17/16 0906             Psychosocial Re-Evaluation   Interventions Encouraged to attend Cardiac Rehabilitation for the exercise       Continued Psychosocial Services Needed No          Vocational Rehabilitation: Provide vocational rehab assistance to qualifying candidates.   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 Participant will state understanding/return demonstration of topics presented.  Learning Barriers/Preferences:     Learning Barriers/Preferences - 02/01/16 1635      Learning Barriers/Preferences   Learning Barriers Sight   Learning Preferences Audio;Video      Education  Topics: Count Your Pulse:  -Group instruction provided by verbal instruction, demonstration, patient participation and written materials to support subject.  Instructors address importance of being able to find your pulse and how to count your pulse when at home  without a heart monitor.  Patients get hands on experience counting their pulse with staff help and individually. Flowsheet Row CARDIAC REHAB PHASE II EXERCISE from 03/15/2016 in [place]  Date  02/18/16  Educator  Maurice Small, RN  Instruction Review Code  2- meets goals/outcomes      Heart Attack, Angina, and Risk Factor Modification:  -Group instruction provided by verbal instruction, video, and written materials to support subject.  Instructors address signs and symptoms of angina and heart attacks.    Also discuss risk factors for heart disease and how to make changes to improve heart health risk factors.   Functional Fitness:  -Group instruction provided by verbal instruction, demonstration, patient participation, and written materials to support subject.  Instructors address safety measures for doing things around the house.  Discuss how to get up and down off the floor, how to pick things up properly, how to safely get out of a chair without assistance, and balance training.   Meditation and Mindfulness:  -Group instruction provided by verbal instruction, patient participation, and written materials to support subject.  Instructor addresses importance of mindfulness and meditation practice to help reduce stress and improve awareness.  Instructor also leads participants through a meditation exercise.    Stretching for Flexibility and Mobility:  -Group instruction provided by verbal instruction, patient participation, and written materials to support subject.  Instructors lead participants through series of stretches that are designed to increase flexibility thus improving mobility.  These stretches are additional exercise for major muscle groups that are typically performed during regular warm up and cool down.   Hands Only CPR Anytime:  -Group instruction provided by verbal instruction, video, patient participation and written materials to support  subject.  Instructors co-teach with AHA video for hands only CPR.  Participants get hands on experience with mannequins.   Nutrition I class: Heart Healthy Eating:  -Group instruction provided by PowerPoint slides, verbal discussion, and written materials to support subject matter. The instructor gives an explanation and review of the Therapeutic Lifestyle Changes diet recommendations, which includes a discussion on lipid goals, dietary fat, sodium, fiber, plant stanol/sterol esters, sugar, and the components of a well-balanced, healthy diet. Flowsheet Row CARDIAC REHAB PHASE II EXERCISE from 03/15/2016 in [place]  Date  03/15/16  Educator  RD  Instruction Review Code  Not applicable [Class handouts given]      Nutrition II class: Lifestyle Skills:  -Group instruction provided by PowerPoint slides, verbal discussion, and written materials to support subject matter. The instructor gives an explanation and review of label reading, grocery shopping for heart health, heart healthy recipe modifications, and ways to make healthier choices when eating out. Flowsheet Row CARDIAC REHAB PHASE II EXERCISE from 03/15/2016 in [place]  Date  03/15/16  Educator  RD  Instruction Review Code  Not applicable [Class handouts given]      Diabetes Question & Answer:  -Group instruction provided by PowerPoint slides, verbal discussion, and written materials to support subject matter. The instructor gives an explanation and review of diabetes co-morbidities, pre- and post-prandial blood glucose goals, pre-exercise blood glucose goals, signs, symptoms, and treatment of hypoglycemia and hyperglycemia, and foot care basics.  Diabetes Blitz:  -Group instruction provided by PowerPoint slides, verbal discussion, and written materials to support subject matter. The instructor gives an explanation and review of the physiology behind type 1 and type 2  diabetes, diabetes medications and rational behind using different medications, pre- and post-prandial blood glucose recommendations and Hemoglobin A1c goals, diabetes diet, and exercise including blood glucose guidelines for exercising safely.  Flowsheet Row CARDIAC REHAB PHASE II EXERCISE from 03/15/2016 in [place]  Date  03/15/16  Educator  RD  Instruction Review Code  Not applicable [Class handouts given]      Portion Distortion:  -Group instruction provided by PowerPoint slides, verbal discussion, written materials, and food models to support subject matter. The instructor gives an explanation of serving size versus portion size, changes in portions sizes over the last 20 years, and what consists of a serving from each food group.   Stress Management:  -Group instruction provided by verbal instruction, video, and written materials to support subject matter.  Instructors review role of stress in heart disease and how to cope with stress positively.   Flowsheet Row CARDIAC REHAB PHASE II EXERCISE from 03/15/2016 in [place]  Date  03/03/16  Instruction Review Code  2- meets goals/outcomes      Exercising on Your Own:  -Group instruction provided by verbal instruction, power point, and written materials to support subject.  Instructors discuss benefits of exercise, components of exercise, frequency and intensity of exercise, and end points for exercise.  Also discuss use of nitroglycerin and activating EMS.  Review options of places to exercise outside of rehab.  Review guidelines for sex with heart disease.   Cardiac Drugs I:  -Group instruction provided by verbal instruction and written materials to support subject.  Instructor reviews cardiac drug classes: antiplatelets, anticoagulants, beta blockers, and statins.  Instructor discusses reasons, side effects, and lifestyle considerations for each drug class.   Cardiac  Drugs II:  -Group instruction provided by verbal instruction and written materials to support subject.  Instructor reviews cardiac drug classes: angiotensin converting enzyme inhibitors (ACE-I), angiotensin II receptor blockers (ARBs), nitrates, and calcium channel blockers.  Instructor discusses reasons, side effects, and lifestyle considerations for each drug class. Flowsheet Row CARDIAC REHAB PHASE II EXERCISE from 03/15/2016 in [place]  Date  02/23/16  Instruction Review Code  2- meets goals/outcomes      Anatomy and Physiology of the Circulatory System:  -Group instruction provided by verbal instruction, video, and written materials to support subject.  Reviews functional anatomy of heart, how it relates to various diagnoses, and what role the heart plays in the overall system.   Knowledge Questionnaire Score:     Knowledge Questionnaire Score - 02/01/16 1354      Knowledge Questionnaire Score   Pre Score 19/24      Core Components/Risk Factors/Patient Goals at Admission:     Personal Goals and Risk Factors at Admission - 02/01/16 1723      Core Components/Risk Factors/Patient Goals on Admission    Weight Management Yes;Weight Loss   Intervention Weight Management/Obesity: Establish reasonable short term and long term weight goals.;Weight Management: Develop a combined nutrition and exercise program designed to reach desired caloric intake, while maintaining appropriate intake of nutrient and fiber, sodium and fats, and appropriate energy expenditure required for the weight goal.;Weight Management: Provide education and appropriate resources to help participant work on and attain dietary goals.;Obesity: Provide education and appropriate  resources to help participant work on and attain dietary goals.   Expected Outcomes Short Term: Continue to assess and modify interventions until short term weight is achieved;Long Term: Adherence to nutrition and  physical activity/exercise program aimed toward attainment of established weight goal;Weight Maintenance: Understanding of the daily nutrition guidelines, which includes 25-35% calories from fat, 7% or less cal from saturated fats, less than 200mg  cholesterol, less than 1.5gm of sodium, & 5 or more servings of fruits and vegetables daily;Weight Loss: Understanding of general recommendations for a balanced deficit meal plan, which promotes 1-2 lb weight loss per week and includes a negative energy balance of [phone] kcal/d;Understanding recommendations for meals to include 15-35% energy as protein, 25-35% energy from fat, 35-60% energy from carbohydrates, less than 200mg  of dietary cholesterol, 20-35 gm of total fiber daily;Understanding of distribution of calorie intake throughout the day with the consumption of 4-5 meals/snacks   Increase Strength and Stamina Yes   Intervention Provide advice, education, support and counseling about physical activity/exercise needs.;Develop an individualized exercise prescription for aerobic and resistive training based on initial evaluation findings, risk stratification, comorbidities and participant's personal goals.   Expected Outcomes Achievement of increased cardiorespiratory fitness and enhanced flexibility, muscular endurance and strength shown through measurements of functional capacity and personal statement of participant.   Diabetes Yes   Intervention Provide education about signs/symptoms and action to take for hypo/hyperglycemia.;Provide education about proper nutrition, including hydration, and aerobic/resistive exercise prescription along with prescribed medications to achieve blood glucose in normal ranges: Fasting glucose 65-99 mg/dL   Expected Outcomes Short Term: Participant verbalizes understanding of the signs/symptoms and immediate care of hyper/hypoglycemia, proper foot care and importance of medication, aerobic/resistive exercise and nutrition plan  for blood glucose control.;Long Term: Attainment of HbA1C < 7%.   Hypertension Yes   Intervention Provide education on lifestyle modifcations including regular physical activity/exercise, weight management, moderate sodium restriction and increased consumption of fresh fruit, vegetables, and low fat dairy, alcohol moderation, and smoking cessation.;Monitor prescription use compliance.   Expected Outcomes Short Term: Continued assessment and intervention until BP is < 140/81mm HG in hypertensive participants. < 130/61mm HG in hypertensive participants with diabetes, heart failure or chronic kidney disease.;Long Term: Maintenance of blood pressure at goal levels.   Lipids Yes   Intervention Provide education and support for participant on nutrition & aerobic/resistive exercise along with prescribed medications to achieve LDL 70mg , HDL >40mg .   Expected Outcomes Short Term: Participant states understanding of desired cholesterol values and is compliant with medications prescribed. Participant is following exercise prescription and nutrition guidelines.   Personal Goal Other Yes   Personal Goal Get better and improve heart health   Intervention Provide exercise programming to assist with improving cardiovascular fitness and functional capacity.   Expected Outcomes Pt will be able to increase exercise tolerance and improve heart health      Core Components/Risk Factors/Patient Goals Review:      Goals and Risk Factor Review    Row Name 03/13/16 1622             Core Components/Risk Factors/Patient Goals Review   Review Pt states he can tell a differnece in his energy level and he's noticed he's had more stamina throughout the day.        Expected Outcomes Continue with exercise routine and gradual workload increases in order to continuously improve cardiorespiratory fitness           Core Components/Risk Factors/Patient Goals at Discharge (Final Review):  Goals and Risk Factor Review  - 03/13/16 1622      Core Components/Risk Factors/Patient Goals Review   Review Pt states he can tell a differnece in his energy level and he's noticed he's had more stamina throughout the day.    Expected Outcomes Continue with exercise routine and gradual workload increases in order to continuously improve cardiorespiratory fitness       ITP Comments:     ITP Comments    Row Name 02/01/16 1640           ITP Comments Dr. Fransico Him, Medical Director          Comments: Klay is making expected progress toward personal goals after completing 11 sessions. Recommend continued exercise and life style modification education including  stress management and relaxation techniques to decrease cardiac risk profile. Aarya is enjoying participating in phase 2 cardiac rehab.[name], RN,BSN 03/17/2016 9:09 AM

## 2016-03-20 ENCOUNTER — Encounter (HOSPITAL_COMMUNITY)
Admission: RE | Admit: 2016-03-20 | Discharge: 2016-03-20 | Disposition: A | Discharge status: Home / Self Care | Payer: Medicare Other | Source: Ambulatory Visit | Attending: Cardiovascular Disease | Admitting: Cardiovascular Disease

## 2016-03-20 DIAGNOSIS — Z951 Presence of aortocoronary bypass graft: Secondary | ICD-10-CM

## 2016-03-20 DIAGNOSIS — Z952 Presence of prosthetic heart valve: Secondary | ICD-10-CM

## 2016-03-22 ENCOUNTER — Encounter (HOSPITAL_COMMUNITY): Payer: Medicare Other

## 2016-03-24 ENCOUNTER — Encounter (HOSPITAL_COMMUNITY)
Admission: RE | Admit: 2016-03-24 | Discharge: 2016-03-24 | Disposition: A | Discharge status: Home / Self Care | Payer: Medicare Other | Source: Ambulatory Visit | Attending: Cardiovascular Disease | Admitting: Cardiovascular Disease

## 2016-03-24 ENCOUNTER — Other Ambulatory Visit (INDEPENDENT_AMBULATORY_CARE_PROVIDER_SITE_OTHER): Payer: Medicare Other

## 2016-03-24 DIAGNOSIS — Z951 Presence of aortocoronary bypass graft: Secondary | ICD-10-CM | POA: Diagnosis not present

## 2016-03-24 DIAGNOSIS — Z0000 Encounter for general adult medical examination without abnormal findings: Secondary | ICD-10-CM

## 2016-03-24 DIAGNOSIS — Z125 Encounter for screening for malignant neoplasm of prostate: Secondary | ICD-10-CM

## 2016-03-24 DIAGNOSIS — Z952 Presence of prosthetic heart valve: Secondary | ICD-10-CM

## 2016-03-24 LAB — LIPID PANEL
Cholesterol: 176 mg/dL (ref 0–200)
HDL: 77.8 mg/dL (ref >39.00)
LDL Cholesterol: 87 mg/dL (ref 0–99)
NONHDL: 98.51
Total CHOL/HDL Ratio: 2
Triglycerides: 60 mg/dL (ref 0.0–149.0)
VLDL: 12 mg/dL (ref 0.0–40.0)

## 2016-03-24 LAB — CBC WITH DIFFERENTIAL/PLATELET
BASOS ABS: 0.1 10*3/uL (ref 0.0–0.1)
Basophils Relative: 0.5 % (ref 0.0–3.0)
EOS PCT: 1.5 % (ref 0.0–5.0)
Eosinophils Absolute: 0.2 10*3/uL (ref 0.0–0.7)
HCT: 36.7 % — ABNORMAL LOW (ref 39.0–52.0)
Hemoglobin: 12.1 g/dL — ABNORMAL LOW (ref 13.0–17.0)
LYMPHS ABS: 3.9 10*3/uL (ref 0.7–4.0)
Lymphocytes Relative: 32.3 % (ref 12.0–46.0)
MCHC: 33.1 g/dL (ref 30.0–36.0)
MCV: 88.2 fl (ref 78.0–100.0)
MONOS PCT: 7.7 % (ref 3.0–12.0)
Monocytes Absolute: 0.9 10*3/uL (ref 0.1–1.0)
NEUTROS PCT: 58 % (ref 43.0–77.0)
Neutro Abs: 7.1 10*3/uL (ref 1.4–7.7)
PLATELETS: 251 10*3/uL (ref 150.0–400.0)
RBC: 4.16 Mil/uL — ABNORMAL LOW (ref 4.22–5.81)
RDW: 15.8 % — ABNORMAL HIGH (ref 11.5–15.5)
WBC: 12.2 10*3/uL — ABNORMAL HIGH (ref 4.0–10.5)

## 2016-03-24 LAB — POC URINALSYSI DIPSTICK (AUTOMATED)
Bilirubin, UA: NEGATIVE
Blood, UA: NEGATIVE
Glucose, UA: NEGATIVE
KETONES UA: NEGATIVE
Leukocytes, UA: NEGATIVE
Nitrite, UA: NEGATIVE
PH UA: 5.5
PROTEIN UA: NEGATIVE
SPEC GRAV UA: 1.02
Urobilinogen, UA: 0.2

## 2016-03-24 LAB — BASIC METABOLIC PANEL
BUN: 21 mg/dL (ref 6–23)
CALCIUM: 9.6 mg/dL (ref 8.4–10.5)
CO2: 30 meq/L (ref 19–32)
Chloride: 102 mEq/L (ref 96–112)
Creatinine, Ser: 0.96 mg/dL (ref 0.40–1.50)
GFR: 99.96 mL/min (ref >60.00)
Glucose, Bld: 89 mg/dL (ref 70–99)
Potassium: 4.5 mEq/L (ref 3.5–5.1)
SODIUM: 140 meq/L (ref 135–145)

## 2016-03-24 LAB — HEPATIC FUNCTION PANEL
ALBUMIN: 4.4 g/dL (ref 3.5–5.2)
ALT: 12 U/L (ref 0–53)
AST: 16 U/L (ref 0–37)
Alkaline Phosphatase: 66 U/L (ref 39–117)
Bilirubin, Direct: 0.1 mg/dL (ref 0.0–0.3)
TOTAL PROTEIN: 7.3 g/dL (ref 6.0–8.3)
Total Bilirubin: 0.5 mg/dL (ref 0.2–1.2)

## 2016-03-24 LAB — MICROALBUMIN / CREATININE URINE RATIO
Creatinine,U: 137.5 mg/dL
MICROALB UR: 1.3 mg/dL (ref 0.0–1.9)
MICROALB/CREAT RATIO: 0.9 mg/g (ref 0.0–30.0)

## 2016-03-24 LAB — HEMOGLOBIN A1C: Hgb A1c MFr Bld: 7.4 % — ABNORMAL HIGH (ref 4.6–6.5)

## 2016-03-24 LAB — PSA: PSA: 0.86 ng/mL (ref 0.10–4.00)

## 2016-03-24 LAB — TSH: TSH: 3.09 u[IU]/mL (ref 0.35–4.50)

## 2016-03-27 ENCOUNTER — Encounter (HOSPITAL_COMMUNITY)
Admission: RE | Admit: 2016-03-27 | Discharge: 2016-03-27 | Disposition: A | Discharge status: Home / Self Care | Payer: Medicare Other | Source: Ambulatory Visit | Attending: Cardiovascular Disease | Admitting: Cardiovascular Disease

## 2016-03-27 DIAGNOSIS — Z952 Presence of prosthetic heart valve: Secondary | ICD-10-CM

## 2016-03-27 DIAGNOSIS — Z951 Presence of aortocoronary bypass graft: Secondary | ICD-10-CM

## 2016-03-29 ENCOUNTER — Encounter (HOSPITAL_COMMUNITY)
Admission: RE | Admit: 2016-03-29 | Discharge: 2016-03-29 | Disposition: A | Discharge status: Home / Self Care | Payer: Medicare Other | Source: Ambulatory Visit | Attending: Cardiovascular Disease | Admitting: Cardiovascular Disease

## 2016-03-29 DIAGNOSIS — Z952 Presence of prosthetic heart valve: Secondary | ICD-10-CM

## 2016-03-29 DIAGNOSIS — Z951 Presence of aortocoronary bypass graft: Secondary | ICD-10-CM | POA: Diagnosis not present

## 2016-03-31 ENCOUNTER — Ambulatory Visit (INDEPENDENT_AMBULATORY_CARE_PROVIDER_SITE_OTHER): Payer: Medicare Other | Admitting: Adult Health

## 2016-03-31 ENCOUNTER — Encounter: Payer: Self-pay | Admitting: Adult Health

## 2016-03-31 ENCOUNTER — Encounter (HOSPITAL_COMMUNITY): Payer: Medicare Other

## 2016-03-31 VITALS — BP 124/70 | Temp 98.3°F | Ht 70.5 in | Wt 184.2 lb

## 2016-03-31 DIAGNOSIS — Z760 Encounter for issue of repeat prescription: Secondary | ICD-10-CM

## 2016-03-31 DIAGNOSIS — IMO0002 Reserved for concepts with insufficient information to code with codable children: Secondary | ICD-10-CM

## 2016-03-31 DIAGNOSIS — Z0000 Encounter for general adult medical examination without abnormal findings: Secondary | ICD-10-CM | POA: Diagnosis not present

## 2016-03-31 DIAGNOSIS — E1140 Type 2 diabetes mellitus with diabetic neuropathy, unspecified: Secondary | ICD-10-CM | POA: Diagnosis not present

## 2016-03-31 DIAGNOSIS — I10 Essential (primary) hypertension: Secondary | ICD-10-CM | POA: Diagnosis not present

## 2016-03-31 DIAGNOSIS — Z794 Long term (current) use of insulin: Secondary | ICD-10-CM | POA: Diagnosis not present

## 2016-03-31 DIAGNOSIS — E1165 Type 2 diabetes mellitus with hyperglycemia: Secondary | ICD-10-CM | POA: Diagnosis not present

## 2016-03-31 DIAGNOSIS — E785 Hyperlipidemia, unspecified: Secondary | ICD-10-CM | POA: Diagnosis not present

## 2016-03-31 MED ORDER — FERROUS GLUCONATE 324 (38 FE) MG PO TABS: 324.0000 mg | ORAL_TABLET | Freq: Every day | ORAL | 3 refills | Status: DC

## 2016-03-31 MED ORDER — GLUCOSE BLOOD VI STRP: ORAL_STRIP | 12 refills | Status: DC

## 2016-03-31 NOTE — Progress Notes (Signed)
Subjective:    Patient ID: [name], male    DOB: 03-31-1945, 71 y.o.   MRN: [id number]  HPI  Patient presents for yearly preventative medicine examination. He is a pleasant 71 year old male who  has a past medical history of Aortic stenosis; Carpal tunnel syndrome (09/24/2014); Coronary artery disease; Diabetes mellitus, type 2 ([place]); Erectile dysfunction; Essential hypertension; and Hyperlipidemia.  All immunizations and health maintenance protocols were reviewed with the patient and needed orders were placed.  Medication reconciliation,  past medical history, social history, problem list and allergies were reviewed in detail with the patient  Goals were established with regard to weight loss, exercise, and  diet in compliance with medications. He is eating a heart healthy diet Continues to participate in cardiac rehabilitation, which she'll be doing for at least 1 more month. He feels as though is continuing to make progress after mitral valve replacement  End of life planning was discussed. He has an advanced directive and living willl   He is up to date on his colonoscopy. He has not seen his eye doctor nor his dentist.   He denies any acute issues  Review of Systems  Constitutional: Negative.   HENT: Negative.   Eyes: Negative.   Respiratory: Negative.   Cardiovascular: Negative.   Gastrointestinal: Negative.   Endocrine: Negative.   Genitourinary: Negative.   Musculoskeletal: Negative.   Skin: Negative.   Allergic/Immunologic: Negative.   Neurological: Negative.   Hematological: Negative.   Psychiatric/Behavioral: Negative.   All other systems reviewed and are negative.  Past Medical History:  Diagnosis Date  . 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 Carpal tunnel syndrome 09/24/2014   Bilateral  . 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 Diabetes mellitus, type 2 ([place])   . Erectile dysfunction   .  Essential hypertension   . Hyperlipidemia     Social History   Social History  . Marital status: Married    Spouse name: N/A  . Number of children: N/A  . Years of education: N/A   Occupational History  . Not on file.   Social History Main Topics  . Smoking status: Never Smoker  . Smokeless tobacco: Never Used  . Alcohol use No  . Drug use: No  . Sexual activity: Yes   Other Topics Concern  . Not on file   Social History Narrative   Married to [name] for 52 years   Retired as Chiropodist   5 children   Never Smoked   Alcohol use- no   Caffeine use: occasional     Past Surgical History:  Procedure Laterality Date  . AORTIC VALVE REPLACEMENT N/A 10/29/2015   Procedure: AORTIC VALVE REPLACEMENT (AVR) WITH 23MM MAGNA EASE TISSUE VALVE.;  Surgeon: [name], MD;  Location: [facility];  Service: Open Heart Surgery;  Laterality: N/A;  . CARDIAC CATHETERIZATION N/A 10/27/2015   Procedure: Left Heart Cath and Coronary Angiography;  Surgeon: [name], MD;  Location: [facility];  Service: Cardiovascular;  Laterality: N/A;  . CORONARY ARTERY BYPASS GRAFT N/A 10/29/2015   Procedure: CORONARY ARTERY BYPASS GRAFTING (CABG) x Four UTILIZING THE LEFT INTERNAL MAMMARY ARTERY AND ENDOSCOPICALLY HARVESTED RIGHT SAPEHENEOUS VEINS.;  Surgeon: [name], MD;  Location: [facility];  Service: Open Heart Surgery;  Laterality: N/A;  . CYST REMOVAL HAND Right   . ESOPHAGOGASTRODUODENOSCOPY N/A 12/02/2015  Procedure: ESOPHAGOGASTRODUODENOSCOPY (EGD);  Surgeon: [name], MD;  Location: [facility];  Service: Gastroenterology;  Laterality: N/A;  . REPLACEMENT ASCENDING AORTA N/A 10/29/2015   Procedure: REPLACEMENT OF ASCENDING AORTA USING 34MM X 30CM WOVEN DOUBLE VELOUR VASCULAR GRAFT.;  Surgeon: [name], MD;  Location: [facility];  Service: Open Heart Surgery;  Laterality: N/A;  . TEE WITHOUT CARDIOVERSION N/A 10/29/2015   Procedure:  TRANSESOPHAGEAL ECHOCARDIOGRAM (TEE);  Surgeon: [name], MD;  Location: [facility];  Service: Open Heart Surgery;  Laterality: N/A;  . UVULECTOMY N/A 12/10/2015   Procedure: UVULECTOMY;  Surgeon: [name], MD;  Location: [facility];  Service: ENT;  Laterality: N/A;    Family History  Problem Relation Age of Onset  . Cancer Father     Throat cancer  . Diabetes Sister     Allergies  Allergen Reactions  . Amiodarone Nausea And Vomiting    Current Outpatient Prescriptions on File Prior to Visit  Medication Sig Dispense Refill  . aspirin EC 81 MG tablet Take 81 mg by mouth daily.    [name] Kitchen atorvastatin (LIPITOR) 40 MG tablet Take 1 tablet (40 mg total) by mouth daily. 30 tablet 11  . B-D ULTRAFINE III SHORT PEN 31G X 8 MM MISC USE 4 TIMES DAILY AS DIRECTED 100 each 0  . ferrous gluconate (FERGON) 324 MG tablet Take 1 tablet (324 mg total) by mouth 2 (two) times daily with a meal. 60 tablet 1  . folic acid (FOLVITE) 1 MG tablet Take 1 tablet (1 mg total) by mouth daily. 30 tablet 1  . glucose blood test strip Use as instructed 100 each 12  . Insulin Glargine (LANTUS SOLOSTAR) 100 UNIT/ML Solostar Pen Inject 15 Units into the skin daily at 10 pm. (Patient taking differently: Inject 17 Units into the skin daily at 10 pm. ) 5 pen 5  . metFORMIN (GLUCOPHAGE) 1000 MG tablet Take 1 tablet (1,000 mg total) by mouth 2 (two) times daily with a meal. 180 tablet 1  . metoprolol tartrate (LOPRESSOR) 25 MG tablet Take 0.5 tablets (12.5 mg total) by mouth 2 (two) times daily. 60 tablet 0  . ondansetron (ZOFRAN ODT) 4 MG disintegrating tablet Take 1 tablet (4 mg total) by mouth every 8 (eight) hours as needed for nausea or vomiting. 30 tablet 0   No current facility-administered medications on file prior to visit.     BP 124/70   Temp 98.3 F (36.8 C) (Oral)   Ht 5' 10.5" (1.791 m)   Wt 184 lb 3.2 oz (83.6 kg)   BMI 26.06 kg/m       Objective:   Physical Exam  Constitutional: He is oriented  to person, place, and time. Vital signs are normal. He appears well-developed and well-nourished. No distress.  HENT:  Head: Normocephalic and atraumatic.  Right Ear: External ear normal.  Left Ear: External ear normal.  Nose: Nose normal.  Mouth/Throat: Oropharynx is clear and moist. No oropharyngeal exudate.  Eyes: Conjunctivae and EOM are normal. Pupils are equal, round, and reactive to light. Right eye exhibits no discharge. Left eye exhibits no discharge. No scleral icterus.  Neck: Normal range of motion. Neck supple. No JVD present. Carotid bruit is not present. No tracheal deviation present. No thyromegaly present.  Cardiovascular: Normal rate, regular rhythm, normal heart sounds and intact distal pulses.  Exam reveals no gallop and no friction rub.   No murmur heard. Pulmonary/Chest: Effort normal and breath sounds normal. No stridor. No respiratory  distress. He has no wheezes. He has no rales. He exhibits no tenderness.  Abdominal: Soft. Bowel sounds are normal. He exhibits no distension and no mass. There is no tenderness. There is no rebound and no guarding.  Genitourinary: Prostate normal. Rectal exam shows guaiac negative stool.  Musculoskeletal: Normal range of motion. He exhibits no edema, tenderness or deformity.  Lymphadenopathy:    He has no cervical adenopathy.  Neurological: He is alert and oriented to person, place, and time. He has normal reflexes. He displays normal reflexes. No cranial nerve deficit. He exhibits normal muscle tone. Coordination normal.  Skin: Skin is warm and dry. No rash noted. He is not diaphoretic. No erythema. No pallor.  Psychiatric: He has a normal mood and affect. Judgment and thought content normal.  Nursing note and vitals reviewed.      Assessment & Plan:  1. Routine general medical examination at a health care facility - Viewed labs in detail with patient and all questions answered - Continue  to work on diabetic diet and get frequent  exercise  2. Uncontrolled type 2 diabetes mellitus with diabetic neuropathy, with long-term current use of insulin (HCC) - Latest A1c 7.4. Is up from 6.9- 3 months ago. Have him increase Lantus from 17 units to 19 units - glucose blood test strip; Use as instructed  Dispense: 100 each; Refill: 12 - Follow-up in 3 months  3. Essential hypertension - At goal. - No change in medication at this time  4. Hyperlipidemia, unspecified hyperlipidemia type - Has improved Lab Results  Component Value Date   CHOL 176 03/24/2016   HDL 77.80 03/24/2016   LDLCALC 87 03/24/2016   LDLDIRECT 112.6 05/14/2012   TRIG 60.0 03/24/2016   CHOLHDL 2 03/24/2016   - Continue current dose of Lipitor  5. Medication refill  - glucose blood test strip; Use as instructed  Dispense: 100 each; Refill: 12 - ferrous gluconate (FERGON) 324 MG tablet; Take 1 tablet (324 mg total) by mouth daily with breakfast.  Dispense: 90 tablet; Refill: 3  [name], NP

## 2016-03-31 NOTE — Patient Instructions (Signed)
Go up on Lantus to 19 units   Make an appointment with your eye doctor   Follow up with me in 3 months

## 2016-04-03 ENCOUNTER — Encounter (HOSPITAL_COMMUNITY)
Admission: RE | Admit: 2016-04-03 | Discharge: 2016-04-03 | Disposition: A | Discharge status: Home / Self Care | Payer: Medicare Other | Source: Ambulatory Visit | Attending: Cardiovascular Disease | Admitting: Cardiovascular Disease

## 2016-04-03 DIAGNOSIS — Z951 Presence of aortocoronary bypass graft: Secondary | ICD-10-CM

## 2016-04-03 DIAGNOSIS — Z952 Presence of prosthetic heart valve: Secondary | ICD-10-CM

## 2016-04-05 ENCOUNTER — Encounter (HOSPITAL_COMMUNITY)
Admission: RE | Admit: 2016-04-05 | Discharge: 2016-04-05 | Disposition: A | Discharge status: Home / Self Care | Payer: Medicare Other | Source: Ambulatory Visit | Attending: Cardiovascular Disease | Admitting: Cardiovascular Disease

## 2016-04-05 DIAGNOSIS — Z952 Presence of prosthetic heart valve: Secondary | ICD-10-CM

## 2016-04-05 DIAGNOSIS — Z951 Presence of aortocoronary bypass graft: Secondary | ICD-10-CM | POA: Diagnosis not present

## 2016-04-05 LAB — GLUCOSE, CAPILLARY: GLUCOSE-CAPILLARY: 120 mg/dL — AB (ref 65–99)

## 2016-04-07 ENCOUNTER — Encounter (HOSPITAL_COMMUNITY): Payer: Medicare Other

## 2016-04-10 ENCOUNTER — Encounter (HOSPITAL_COMMUNITY): Payer: Medicare Other

## 2016-04-12 ENCOUNTER — Encounter (HOSPITAL_COMMUNITY)
Admission: RE | Admit: 2016-04-12 | Discharge: 2016-04-12 | Disposition: A | Discharge status: Home / Self Care | Payer: Medicare Other | Source: Ambulatory Visit | Attending: Cardiovascular Disease | Admitting: Cardiovascular Disease

## 2016-04-12 DIAGNOSIS — Z951 Presence of aortocoronary bypass graft: Secondary | ICD-10-CM | POA: Diagnosis not present

## 2016-04-12 DIAGNOSIS — Z952 Presence of prosthetic heart valve: Secondary | ICD-10-CM

## 2016-04-13 ENCOUNTER — Ambulatory Visit (INDEPENDENT_AMBULATORY_CARE_PROVIDER_SITE_OTHER): Payer: Medicare Other | Admitting: Cardiothoracic Surgery

## 2016-04-13 ENCOUNTER — Ambulatory Visit
Admission: RE | Admit: 2016-04-13 | Discharge: 2016-04-13 | Disposition: A | Discharge status: Home / Self Care | Payer: Medicare Other | Source: Ambulatory Visit | Attending: Cardiothoracic Surgery | Admitting: Cardiothoracic Surgery

## 2016-04-13 ENCOUNTER — Encounter: Payer: Self-pay | Admitting: Cardiothoracic Surgery

## 2016-04-13 VITALS — BP 142/78 | HR 84 | Resp 16 | Ht 72.0 in | Wt 184.8 lb

## 2016-04-13 DIAGNOSIS — Z95828 Presence of other vascular implants and grafts: Secondary | ICD-10-CM

## 2016-04-13 DIAGNOSIS — Z951 Presence of aortocoronary bypass graft: Secondary | ICD-10-CM

## 2016-04-13 DIAGNOSIS — I712 Thoracic aortic aneurysm, without rupture, unspecified: Secondary | ICD-10-CM

## 2016-04-13 DIAGNOSIS — Z952 Presence of prosthetic heart valve: Secondary | ICD-10-CM

## 2016-04-13 MED ORDER — IOPAMIDOL (ISOVUE-300) INJECTION 61%
75.0000 mL | Freq: Once | INTRAVENOUS | Status: AC | PRN
  Administered 2016-04-13: 75 mL via INTRAVENOUS

## 2016-04-13 NOTE — Progress Notes (Signed)
Cardiac Individual Treatment Plan  Patient Details  Name: [name] MRN: [id number] Date of Birth: [birth year] Referring Provider:   Flowsheet Row CARDIAC REHAB PHASE II ORIENTATION from 02/01/2016 in [facility]  Referring Provider  [name], Arnette Norris MD      Initial Encounter Date:  [place] PHASE II ORIENTATION from 02/01/2016 in [facility]  Date  02/01/16  Referring Provider  [name], Arnette Norris MD      Visit Diagnosis: S/P CABG x 4  S/P AVR (aortic valve replacement)  Patient's Home Medications on Admission:  Current Outpatient Prescriptions:  .  aspirin EC 81 MG tablet, Take 81 mg by mouth daily., Disp: , Rfl:  .  atorvastatin (LIPITOR) 40 MG tablet, Take 1 tablet (40 mg total) by mouth daily., Disp: 30 tablet, Rfl: 11 .  B-D ULTRAFINE III SHORT PEN 31G X 8 MM MISC, USE 4 TIMES DAILY AS DIRECTED, Disp: 100 each, Rfl: 0 .  ferrous gluconate (FERGON) 324 MG tablet, Take 1 tablet (324 mg total) by mouth daily with breakfast., Disp: 90 tablet, Rfl: 3 .  folic acid (FOLVITE) 1 MG tablet, Take 1 tablet (1 mg total) by mouth daily., Disp: 30 tablet, Rfl: 1 .  glucose blood test strip, Use as instructed, Disp: 100 each, Rfl: 12 .  Insulin Glargine (LANTUS SOLOSTAR) 100 UNIT/ML Solostar Pen, Inject 15 Units into the skin daily at 10 pm. (Patient taking differently: Inject 19 Units into the skin daily at 10 pm. ), Disp: 5 pen, Rfl: 5 .  metFORMIN (GLUCOPHAGE) 1000 MG tablet, Take 1 tablet (1,000 mg total) by mouth 2 (two) times daily with a meal., Disp: 180 tablet, Rfl: 1 .  metoprolol tartrate (LOPRESSOR) 25 MG tablet, Take 0.5 tablets (12.5 mg total) by mouth 2 (two) times daily., Disp: 60 tablet, Rfl: 0 .  ondansetron (ZOFRAN ODT) 4 MG disintegrating tablet, Take 1 tablet (4 mg total) by mouth every 8 (eight) hours as needed for nausea or vomiting., Disp: 30 tablet, Rfl: 0 No current facility-administered  medications for this encounter.   Facility-Administered Medications Ordered in Other Encounters:  .  iopamidol (ISOVUE-300) 61 % injection 75 mL, 75 mL, Intravenous, Once PRN, [name], MD  Past Medical History: Past Medical History:  Diagnosis Date  . 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 Carpal tunnel syndrome 09/24/2014   Bilateral  . 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 Diabetes mellitus, type 2 ([place])   . Erectile dysfunction   . Essential hypertension   . Hyperlipidemia     Tobacco Use: History  Smoking Status  . Never Smoker  Smokeless Tobacco  . Never Used    Labs: Recent Review Flowsheet Data    Labs for ITP Cardiac and Pulmonary Rehab Latest Ref Rng & Units 10/30/2015 10/30/2015 10/31/2015 12/06/2015 03/24/2016   Cholestrol 0 - 200 mg/dL - - - - 176   LDLCALC 0 - 99 mg/dL - - - - 87   LDLDIRECT mg/dL - - - - -   HDL >39.00 mg/dL - - - - 77.80   Trlycerides 0.0 - 149.0 mg/dL - - - - 60.0   Hemoglobin A1c 4.6 - 6.5 % - - - - 7.4(H)   PHART 7.350 - 7.450 - - - - -   PCO2ART 32.0 - 48.0 mmHg - - - - -   HCO3 20.0 -  28.0 mmol/L 23.0 - - - -   TCO2 0 - 100 mmol/L '24 23 25 28 ' -   ACIDBASEDEF 0.0 - 2.0 mmol/L 2.0 - - - -   O2SAT % 98.0 - - - -      Capillary Blood Glucose: Lab Results  Component Value Date   GLUCAP 120 (H) 04/05/2016   GLUCAP 94 03/10/2016   GLUCAP 181 (H) 02/28/2016   GLUCAP 153 (H) 02/25/2016   GLUCAP 110 (H) 02/25/2016     Exercise Target Goals:    Exercise Program Goal: Individual exercise prescription set with THRR, safety & activity barriers. Participant demonstrates ability to understand and report RPE using BORG scale, to self-measure pulse accurately, and to acknowledge the importance of the exercise prescription.  Exercise Prescription Goal: Starting with aerobic activity 30 plus minutes a day, 3 days per week for initial exercise prescription. Provide home  exercise prescription and guidelines that participant acknowledges understanding prior to discharge.  Activity Barriers & Risk Stratification:     Activity Barriers & Cardiac Risk Stratification - 02/01/16 1634      Activity Barriers & Cardiac Risk Stratification   Activity Barriers None   Cardiac Risk Stratification High      6 Minute Walk:     6 Minute Walk    Row Name 02/01/16 1635 02/01/16 1638       6 Minute Walk   Phase Initial  -    Distance 1116 feet  -    Walk Time 6 minutes  -    # of Rest Breaks 0  -    MPH  - 2.12    METS  - 3.6    RPE 11  -    VO2 Peak  - 12.74    Symptoms No  -    Resting HR 83 bpm  -    Resting BP 114/70  -    Max Ex. HR 99 bpm  -    Max Ex. BP 108/70  -    2 Minute Post BP 108/64  -       Oxygen Initial Assessment:   Oxygen Re-Evaluation:   Oxygen Discharge (Final Oxygen Re-Evaluation):   Initial Exercise Prescription:     Initial Exercise Prescription - 02/01/16 1600      Date of Initial Exercise RX and Referring Provider   Date 02/01/16   Referring Provider [name], Philip MD     Treadmill   MPH 2   Grade 0   Minutes 10   METs 2.53     Bike   Level 2  upright scifit bike   Minutes 10   METs 2     NuStep   Level 2   Minutes 10   METs 2     Prescription Details   Frequency (times per week) 3   Duration Progress to 30 minutes of continuous aerobic without signs/symptoms of physical distress     Intensity   THRR 40-80% of Max Heartrate 61-122   Ratings of Perceived Exertion 11-13   Perceived Dyspnea 0-4     Progression   Progression Continue progressive overload as per policy without signs/symptoms or physical distress.     Resistance Training   Training Prescription Yes   Weight 2lbs   Reps 10-12      Perform Capillary Blood Glucose checks as needed.  Exercise Prescription Changes:     Exercise Prescription Changes    Row Name 03/13/16 1600 04/12/16 1400  Response to Exercise    Blood Pressure (Admit) 118/68 116/72      Blood Pressure (Exercise) 148/68 146/70      Blood Pressure (Exit) 104/64 120/80      Heart Rate (Admit) 91 bpm 81 bpm      Heart Rate (Exercise) 126 bpm 140 bpm      Heart Rate (Exit) 96 bpm 103 bpm      Rating of Perceived Exertion (Exercise) 9 7      Duration Progress to 45 minutes of aerobic exercise without signs/symptoms of physical distress Progress to 45 minutes of aerobic exercise without signs/symptoms of physical distress      Intensity THRR unchanged THRR unchanged        Progression   Progression Continue to progress workloads to maintain intensity without signs/symptoms of physical distress. Continue to progress workloads to maintain intensity without signs/symptoms of physical distress.      Average METs 5.9 6.7        Resistance Training   Training Prescription Yes Yes      Weight 6lb 8lb      Reps 10-12 10-15        Treadmill   MPH 3 3.1      Grade 2 2      Minutes 10 10      METs 4.12 4.23        Bike   Level 3.5  upright scifit bike 3.5  upright scifit bike      Minutes 10 10      METs 6.3 6.7        NuStep   Level 4 4      Minutes 10 10      METs 7.2 9.3        Home Exercise Plan   Plans to continue exercise at [facility] (comment)      Frequency Add 4 additional days to program exercise sessions. Add 4 additional days to program exercise sessions.         Exercise Comments:     Exercise Comments    Row Name 03/13/16 1622 04/12/16 1427         Exercise Comments Reviewed METs and goals with pt.  Pt is dong great with exercise! Reviewed METs and goals with pt.  Pt is doing excellent with exercise!         Exercise Goals and Review:     Exercise Goals    Row Name 04/12/16 1422             Exercise Goals   Increase Physical Activity Yes       Intervention Provide advice, education, support and counseling about physical activity/exercise needs.;Develop an individualized exercise prescription  for aerobic and resistive training based on initial evaluation findings, risk stratification, comorbidities and participant's personal goals.       Expected Outcomes Achievement of increased cardiorespiratory fitness and enhanced flexibility, muscular endurance and strength shown through measurements of functional capacity and personal statement of participant.       Increase Strength and Stamina Yes       Intervention Provide advice, education, support and counseling about physical activity/exercise needs.;Develop an individualized exercise prescription for aerobic and resistive training based on initial evaluation findings, risk stratification, comorbidities and participant's personal goals.       Expected Outcomes Achievement of increased cardiorespiratory fitness and enhanced flexibility, muscular endurance and strength shown through measurements of functional capacity and personal statement of participant.  Exercise Goals Re-Evaluation :     Exercise Goals Re-Evaluation    Row Name 04/12/16 1422 04/12/16 1423           Exercise Goal Re-Evaluation   Exercise Goals Review Increase Physical Activity;Increase Strenth and Stamina  -      Comments Pt has improved tremendously since beginning the program.   Pt has improved tremendously since beginning the program and he is gradually increasing his MET level each week.  He is handling workload increases very well and he is walking everyday at home for 20-30 minutes      Expected Outcomes  - Continue with CR exercise prescription and HEP in order to progressively increase cardiorespiratory fitness and strength.            Discharge Exercise Prescription (Final Exercise Prescription Changes):     Exercise Prescription Changes - 04/12/16 1400      Response to Exercise   Blood Pressure (Admit) 116/72   Blood Pressure (Exercise) 146/70   Blood Pressure (Exit) 120/80   Heart Rate (Admit) 81 bpm   Heart Rate (Exercise) 140 bpm    Heart Rate (Exit) 103 bpm   Rating of Perceived Exertion (Exercise) 7   Duration Progress to 45 minutes of aerobic exercise without signs/symptoms of physical distress   Intensity THRR unchanged     Progression   Progression Continue to progress workloads to maintain intensity without signs/symptoms of physical distress.   Average METs 6.7     Resistance Training   Training Prescription Yes   Weight 8lb   Reps 10-15     Treadmill   MPH 3.1   Grade 2   Minutes 10   METs 4.23     Bike   Level 3.5  upright scifit bike   Minutes 10   METs 6.7     NuStep   Level 4   Minutes 10   METs 9.3     Home Exercise Plan   Plans to continue exercise at Home (comment)   Frequency Add 4 additional days to program exercise sessions.      Nutrition:  Target Goals: Understanding of nutrition guidelines, daily intake of sodium <1564m, cholesterol <2031m calories 30% from fat and 7% or less from saturated fats, daily to have 5 or more servings of fruits and vegetables.  Biometrics:     Pre Biometrics - 02/01/16 1636      Pre Biometrics   Waist Circumference 34 inches   Hip Circumference 37.5 inches   Waist to Hip Ratio 0.91 %   Triceps Skinfold 17 mm   % Body Fat 24.1 %   Grip Strength 41 kg   Flexibility 7.5 in   Single Leg Stand 30 seconds       Nutrition Therapy Plan and Nutrition Goals:     Nutrition Therapy & Goals - 03/15/16 1617      Nutrition Therapy   Diet Carb Modified, Therapeutic Lifestyle Changes     Personal Nutrition Goals   Nutrition Goal Maintain wt while in Cardiac Rehab     Intervention Plan   Intervention Prescribe, educate and counsel regarding individualized specific dietary modifications aiming towards targeted core components such as weight, hypertension, lipid management, diabetes, heart failure and other comorbidities.   Expected Outcomes Short Term Goal: Understand basic principles of dietary content, such as calories, fat, sodium,  cholesterol and nutrients.;Long Term Goal: Adherence to prescribed nutrition plan.      Nutrition Discharge: Nutrition Scores:  Nutrition Assessments - 03/15/16 1617      MEDFICTS Scores   Pre Score 32      Nutrition Goals Re-Evaluation:   Nutrition Goals Re-Evaluation:   Nutrition Goals Discharge (Final Nutrition Goals Re-Evaluation):   Psychosocial: Target Goals: Acknowledge presence or absence of significant depression and/or stress, maximize coping skills, provide positive support system. Participant is able to verbalize types and ability to use techniques and skills needed for reducing stress and depression.  Initial Review & Psychosocial Screening:     Initial Psych Review & Screening - 02/01/16 [place]? Yes   Comments  brief psychosocial assesment reveals no furhter intervention needed at this time     Barriers   Psychosocial barriers to participate in program There are no identifiable barriers or psychosocial needs.     Screening Interventions   Interventions Encouraged to exercise      Quality of Life Scores:     Quality of Life - 02/28/16 1629      Quality of Life Scores   Family Pre --  worried about Son's HTN says he is better now.      PHQ-9: Recent Review Flowsheet Data    Depression screen [facility] 2/9 02/16/2016 04/03/2014 03/03/2013   Decreased Interest 0 0 0   Down, Depressed, Hopeless 0 0 0   PHQ - 2 Score 0 0 0     Interpretation of Total Score  Total Score Depression Severity:  1-4 = Minimal depression, 5-9 = Mild depression, 10-14 = Moderate depression, 15-19 = Moderately severe depression, 20-27 = Severe depression   Psychosocial Evaluation and Intervention:   Psychosocial Re-Evaluation:     Psychosocial Re-Evaluation    Row Name 03/17/16 0906 04/13/16 1434           Psychosocial Re-Evaluation   Current issues with  - None Identified      Interventions Encouraged to attend Cardiac  Rehabilitation for the exercise Encouraged to attend Cardiac Rehabilitation for the exercise      Continue Psychosocial Services  No No Follow up required         Psychosocial Discharge (Final Psychosocial Re-Evaluation):     Psychosocial Re-Evaluation - 04/13/16 1434      Psychosocial Re-Evaluation   Current issues with None Identified   Interventions Encouraged to attend Cardiac Rehabilitation for the exercise   Continue Psychosocial Services  No Follow up required      Vocational Rehabilitation: Provide vocational rehab assistance to qualifying candidates.   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 Participant will state understanding/return demonstration of topics presented.  Learning Barriers/Preferences:     Learning Barriers/Preferences - 02/01/16 1635      Learning Barriers/Preferences   Learning Barriers Sight   Learning Preferences Audio;Video      Education Topics: Count Your Pulse:  -Group instruction provided by verbal instruction, demonstration, patient participation and written materials to support subject.  Instructors address importance of being able to find your pulse and how to count your pulse when at home without a heart monitor.  Patients get hands on experience  counting their pulse with staff help and individually. Flowsheet Row CARDIAC REHAB PHASE II EXERCISE from 04/12/2016 in [facility]  Date  02/18/16  Educator  Maurice Small, RN  Instruction Review Code  2- meets goals/outcomes      Heart Attack, Angina, and Risk Factor Modification:  -Group instruction  provided by verbal instruction, video, and written materials to support subject.  Instructors address signs and symptoms of angina and heart attacks.    Also discuss risk factors for heart disease and how to make changes to improve heart health risk factors.   Functional Fitness:  -Group instruction provided by verbal instruction, demonstration, patient participation, and written materials to support subject.  Instructors address safety measures for doing things around the house.  Discuss how to get up and down off the floor, how to pick things up properly, how to safely get out of a chair without assistance, and balance training.   Meditation and Mindfulness:  -Group instruction provided by verbal instruction, patient participation, and written materials to support subject.  Instructor addresses importance of mindfulness and meditation practice to help reduce stress and improve awareness.  Instructor also leads participants through a meditation exercise.    Stretching for Flexibility and Mobility:  -Group instruction provided by verbal instruction, patient participation, and written materials to support subject.  Instructors lead participants through series of stretches that are designed to increase flexibility thus improving mobility.  These stretches are additional exercise for major muscle groups that are typically performed during regular warm up and cool down.   Hands Only CPR Anytime:  -Group instruction provided by verbal instruction, video, patient participation and written materials to support subject.  Instructors co-teach with [facility] video for hands only CPR.  Participants get hands on experience with mannequins.   Nutrition I class: Heart Healthy Eating:  -Group instruction provided by PowerPoint slides, verbal discussion, and written materials to support subject matter. The instructor gives an explanation and review of the Therapeutic Lifestyle Changes diet recommendations, which  includes a discussion on lipid goals, dietary fat, sodium, fiber, plant stanol/sterol esters, sugar, and the components of a well-balanced, healthy diet. Flowsheet Row CARDIAC REHAB PHASE II EXERCISE from 04/12/2016 in [facility]  Date  03/15/16  Educator  RD  Instruction Review Code  Not applicable [Class handouts given]      Nutrition II class: Lifestyle Skills:  -Group instruction provided by PowerPoint slides, verbal discussion, and written materials to support subject matter. The instructor gives an explanation and review of label reading, grocery shopping for heart health, heart healthy recipe modifications, and ways to make healthier choices when eating out. Flowsheet Row CARDIAC REHAB PHASE II EXERCISE from 04/12/2016 in [facility]  Date  03/15/16  Educator  RD  Instruction Review Code  Not applicable [Class handouts given]      Diabetes Question & Answer:  -Group instruction provided by PowerPoint slides, verbal discussion, and written materials to support subject matter. The instructor gives an explanation and review of diabetes co-morbidities, pre- and post-prandial blood glucose goals, pre-exercise blood glucose goals, signs, symptoms, and treatment of hypoglycemia and hyperglycemia, and foot care basics. Flowsheet Row CARDIAC REHAB PHASE II EXERCISE from 04/12/2016 in [facility]  Date  03/24/16  Educator  RD  Instruction Review Code  2- meets goals/outcomes      Diabetes Blitz:  -Group instruction provided by PowerPoint slides, verbal discussion, and written materials  to support subject matter. The instructor gives an explanation and review of the physiology behind type 1 and type 2 diabetes, diabetes medications and rational behind using different medications, pre- and post-prandial blood glucose recommendations and Hemoglobin A1c goals, diabetes diet, and exercise including blood  glucose guidelines for exercising safely.  Flowsheet Row CARDIAC REHAB PHASE II EXERCISE from 04/12/2016 in [facility]  Date  03/15/16  Educator  RD  Instruction Review Code  Not applicable [Class handouts given]      Portion Distortion:  -Group instruction provided by PowerPoint slides, verbal discussion, written materials, and food models to support subject matter. The instructor gives an explanation of serving size versus portion size, changes in portions sizes over the last 20 years, and what consists of a serving from each food group. Flowsheet Row CARDIAC REHAB PHASE II EXERCISE from 04/12/2016 in [facility]  Date  04/12/16  Educator  RD  Instruction Review Code  2- meets goals/outcomes      Stress Management:  -Group instruction provided by verbal instruction, video, and written materials to support subject matter.  Instructors review role of stress in heart disease and how to cope with stress positively.   Flowsheet Row CARDIAC REHAB PHASE II EXERCISE from 04/12/2016 in [facility]  Date  03/03/16  Instruction Review Code  2- meets goals/outcomes      Exercising on Your Own:  -Group instruction provided by verbal instruction, power point, and written materials to support subject.  Instructors discuss benefits of exercise, components of exercise, frequency and intensity of exercise, and end points for exercise.  Also discuss use of nitroglycerin and activating EMS.  Review options of places to exercise outside of rehab.  Review guidelines for sex with heart disease.   Cardiac Drugs I:  -Group instruction provided by verbal instruction and written materials to support subject.  Instructor reviews cardiac drug classes: antiplatelets, anticoagulants, beta blockers, and statins.  Instructor discusses reasons, side effects, and lifestyle considerations for each drug class. Flowsheet Row  CARDIAC REHAB PHASE II EXERCISE from 04/12/2016 in [facility]  Date  03/29/16  Instruction Review Code  2- meets goals/outcomes      Cardiac Drugs II:  -Group instruction provided by verbal instruction and written materials to support subject.  Instructor reviews cardiac drug classes: angiotensin converting enzyme inhibitors (ACE-I), angiotensin II receptor blockers (ARBs), nitrates, and calcium channel blockers.  Instructor discusses reasons, side effects, and lifestyle considerations for each drug class. Flowsheet Row CARDIAC REHAB PHASE II EXERCISE from 04/12/2016 in [place]  Date  02/23/16  Instruction Review Code  2- meets goals/outcomes      Anatomy and Physiology of the Circulatory System:  -Group instruction provided by verbal instruction, video, and written materials to support subject.  Reviews functional anatomy of heart, how it relates to various diagnoses, and what role the heart plays in the overall system.   Knowledge Questionnaire Score:     Knowledge Questionnaire Score - 02/01/16 1354      Knowledge Questionnaire Score   Pre Score 19/24      Core Components/Risk Factors/Patient Goals at Admission:     Personal Goals and Risk Factors at Admission - 02/01/16 1723      Core Components/Risk Factors/Patient Goals on Admission    Weight Management Yes;Weight Loss   Intervention Weight Management/Obesity: Establish reasonable short term and long term weight goals.;Weight Management:  Develop a combined nutrition and exercise program designed to reach desired caloric intake, while maintaining appropriate intake of nutrient and fiber, sodium and fats, and appropriate energy expenditure required for the weight goal.;Weight Management: Provide education and appropriate resources to help participant work on and attain dietary goals.;Obesity: Provide education and appropriate resources to help participant work on  and attain dietary goals.   Expected Outcomes Short Term: Continue to assess and modify interventions until short term weight is achieved;Long Term: Adherence to nutrition and physical activity/exercise program aimed toward attainment of established weight goal;Weight Maintenance: Understanding of the daily nutrition guidelines, which includes 25-35% calories from fat, 7% or less cal from saturated fats, less than 255m cholesterol, less than 1.5gm of sodium, & 5 or more servings of fruits and vegetables daily;Weight Loss: Understanding of general recommendations for a balanced deficit meal plan, which promotes 1-2 lb weight loss per week and includes a negative energy balance of [phone] kcal/d;Understanding recommendations for meals to include 15-35% energy as protein, 25-35% energy from fat, 35-60% energy from carbohydrates, less than 2058mof dietary cholesterol, 20-35 gm of total fiber daily;Understanding of distribution of calorie intake throughout the day with the consumption of 4-5 meals/snacks   Increase Strength and Stamina Yes   Intervention Provide advice, education, support and counseling about physical activity/exercise needs.;Develop an individualized exercise prescription for aerobic and resistive training based on initial evaluation findings, risk stratification, comorbidities and participant's personal goals.   Expected Outcomes Achievement of increased cardiorespiratory fitness and enhanced flexibility, muscular endurance and strength shown through measurements of functional capacity and personal statement of participant.   Diabetes Yes   Intervention Provide education about signs/symptoms and action to take for hypo/hyperglycemia.;Provide education about proper nutrition, including hydration, and aerobic/resistive exercise prescription along with prescribed medications to achieve blood glucose in normal ranges: Fasting glucose 65-99 mg/dL   Expected Outcomes Short Term: Participant  verbalizes understanding of the signs/symptoms and immediate care of hyper/hypoglycemia, proper foot care and importance of medication, aerobic/resistive exercise and nutrition plan for blood glucose control.;Long Term: Attainment of HbA1C < 7%.   Hypertension Yes   Intervention Provide education on lifestyle modifcations including regular physical activity/exercise, weight management, moderate sodium restriction and increased consumption of fresh fruit, vegetables, and low fat dairy, alcohol moderation, and smoking cessation.;Monitor prescription use compliance.   Expected Outcomes Short Term: Continued assessment and intervention until BP is < 140/9012mG in hypertensive participants. < 130/71m33m in hypertensive participants with diabetes, heart failure or chronic kidney disease.;Long Term: Maintenance of blood pressure at goal levels.   Lipids Yes   Intervention Provide education and support for participant on nutrition & aerobic/resistive exercise along with prescribed medications to achieve LDL <70mg32mL >40mg.72mxpected Outcomes Short Term: Participant states understanding of desired cholesterol values and is compliant with medications prescribed. Participant is following exercise prescription and nutrition guidelines.   Personal Goal Other Yes   Personal Goal Get better and improve heart health   Intervention Provide exercise programming to assist with improving cardiovascular fitness and functional capacity.   Expected Outcomes Pt will be able to increase exercise tolerance and improve heart health      Core Components/Risk Factors/Patient Goals Review:      Goals and Risk Factor Review    Row Name 03/13/16 1622             Core Components/Risk Factors/Patient Goals Review   Review Pt states he can tell a differnece in his energy level and he's noticed  he's had more stamina throughout the day.        Expected Outcomes Continue with exercise routine and gradual workload increases  in order to continuously improve cardiorespiratory fitness           Core Components/Risk Factors/Patient Goals at Discharge (Final Review):      Goals and Risk Factor Review - 03/13/16 1622      Core Components/Risk Factors/Patient Goals Review   Review Pt states he can tell a differnece in his energy level and he's noticed he's had more stamina throughout the day.    Expected Outcomes Continue with exercise routine and gradual workload increases in order to continuously improve cardiorespiratory fitness       ITP Comments:     ITP Comments    Row Name 02/01/16 1640 03/17/16 1622         ITP Comments Dr. Fransico Him, Medical Director Attended Hypertension education class, expected outcome met         Comments: Yu is making expected progress toward personal goals after completing 19 sessions. Recommend continued exercise and life style modification education including  stress management and relaxation techniques to decrease cardiac risk profile. Ladarrell works hard when participates in cardiac rehab and is enjoying coming to the program. Belal's blood sugars have been good. Ruffin checks his blood sugars with his own meter.[name], RN,BSN 04/13/2016 2:44 PM

## 2016-04-13 NOTE — Progress Notes (Signed)
Hi, Thanks for the note.  I reviewed his CT and EGD findings. On his EGD he had what I thought was a paraesophageal hernia in this area with some retained food - it's hard to determine endoscopically if this is a diverticulum or paraesophageal hernia as they can look similar on endoscopy. Either way, it appears stable over time. If he's not having symptoms from it, then I don't think he needs anything further done. If he is having symptoms from it, I'd be happy to see him in follow up to discuss options. Thanks,  Richardson Landry

## 2016-04-13 NOTE — Progress Notes (Signed)
[address]       [place] [address]             [phone]      Arian R Aja [facility] Medical Record [id number] Date of Birth: 04-01-1945  Referring: [name], [name], MD Primary Care: [name], NP  Chief Complaint:   POST OP FOLLOW UP 10/29/2015   OPERATIVE REPORT  PREOPERATIVE DIAGNOSES:  Severe 3-vessel coronary occlusive disease. Bicuspid aortic valve with moderate to severe aortic stenosis.  Dilated ascending aorta 4.7 cm. PROCEDURE PERFORMED: 1. Coronary artery bypass grafting x4 with left internal mammary to     the left anterior descending coronary artery, sequential reverse     saphenous vein graft to the first and second obtuse marginal,     reverse saphenous vein graft to the distal right coronary artery     with right vein greater saphenous endo vein harvesting. 2. Replacement of aortic valve with pericardial tissue valve Edwards     [employer], model 3300 TFX 23 mm, serial [id number] supra coronary     replacement of ascending aorta with 34-mm Hemashield graft,     Wheat procedure. SURGEON:  [name], MD.  History of Present Illness:     The patient has no signs or symptoms of s congestive heart failure or angina. Since his amiodarone was stopped he said no further GI symptoms, no nausea or vomiting.      Past Surgical History:  Procedure Laterality Date  . AORTIC VALVE REPLACEMENT N/A 10/29/2015   Procedure: AORTIC VALVE REPLACEMENT (AVR) WITH 23MM MAGNA EASE TISSUE VALVE.;  Surgeon: [name], MD;  Location: [facility];  Service: Open Heart Surgery;  Laterality: N/A;  . CARDIAC CATHETERIZATION N/A 10/27/2015   Procedure: Left Heart Cath and Coronary Angiography;  Surgeon: [name], MD;  Location: [facility];  Service: Cardiovascular;  Laterality: N/A;  . CORONARY ARTERY BYPASS GRAFT N/A 10/29/2015   Procedure: CORONARY ARTERY BYPASS GRAFTING (CABG) x Four UTILIZING THE LEFT INTERNAL MAMMARY ARTERY  AND ENDOSCOPICALLY HARVESTED RIGHT SAPEHENEOUS VEINS.;  Surgeon: [name], MD;  Location: [facility];  Service: Open Heart Surgery;  Laterality: N/A;  . CYST REMOVAL HAND Right   . ESOPHAGOGASTRODUODENOSCOPY N/A 12/02/2015   Procedure: ESOPHAGOGASTRODUODENOSCOPY (EGD);  Surgeon: [name], MD;  Location: [facility];  Service: Gastroenterology;  Laterality: N/A;  . REPLACEMENT ASCENDING AORTA N/A 10/29/2015   Procedure: REPLACEMENT OF ASCENDING AORTA USING 34MM X 30CM WOVEN DOUBLE VELOUR VASCULAR GRAFT.;  Surgeon: [name], MD;  Location: [facility];  Service: Open Heart Surgery;  Laterality: N/A;  . TEE WITHOUT CARDIOVERSION N/A 10/29/2015   Procedure: TRANSESOPHAGEAL ECHOCARDIOGRAM (TEE);  Surgeon: [name], MD;  Location: [facility];  Service: Open Heart Surgery;  Laterality: N/A;  . UVULECTOMY N/A 12/10/2015   Procedure: UVULECTOMY;  Surgeon: [name], MD;  Location: [facility];  Service: ENT;  Laterality: N/A;     Past Medical History:  Diagnosis Date  . 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 Carpal tunnel syndrome 09/24/2014   Bilateral  . 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 Diabetes mellitus, type 2 ([place])   . Erectile dysfunction   . Essential hypertension   . Hyperlipidemia      History  Smoking Status  . Never Smoker  Smokeless Tobacco  . Never Used    History  Alcohol  Use No     Allergies  Allergen Reactions  . Amiodarone Nausea And Vomiting    Current Outpatient Prescriptions  Medication Sig Dispense Refill  . aspirin EC 81 MG tablet Take 81 mg by mouth daily.    [name] Kitchen atorvastatin (LIPITOR) 40 MG tablet Take 1 tablet (40 mg total) by mouth daily. 30 tablet 11  . B-D ULTRAFINE III SHORT PEN 31G X 8 MM MISC USE 4 TIMES DAILY AS DIRECTED 100 each 0  . ferrous gluconate (FERGON) 324 MG tablet Take 1 tablet (324 mg total) by mouth daily with breakfast. 90 tablet 3  . folic acid  (FOLVITE) 1 MG tablet Take 1 tablet (1 mg total) by mouth daily. 30 tablet 1  . glucose blood test strip Use as instructed 100 each 12  . Insulin Glargine (LANTUS SOLOSTAR) 100 UNIT/ML Solostar Pen Inject 15 Units into the skin daily at 10 pm. (Patient taking differently: Inject 19 Units into the skin daily at 10 pm. ) 5 pen 5  . metFORMIN (GLUCOPHAGE) 1000 MG tablet Take 1 tablet (1,000 mg total) by mouth 2 (two) times daily with a meal. 180 tablet 1  . metoprolol tartrate (LOPRESSOR) 25 MG tablet Take 0.5 tablets (12.5 mg total) by mouth 2 (two) times daily. 60 tablet 0  . ondansetron (ZOFRAN ODT) 4 MG disintegrating tablet Take 1 tablet (4 mg total) by mouth every 8 (eight) hours as needed for nausea or vomiting. 30 tablet 0   No current facility-administered medications for this visit.        Physical Exam: BP (!) 142/78 (BP Location: Right Arm, Patient Position: Sitting, Cuff Size: Large)   Pulse 84   Resp 16   Ht 6' (1.829 m)   Wt 184 lb 12.8 oz (83.8 kg)   SpO2 96% Comment: ON RA  BMI 25.06 kg/m   General appearance: alert and cooperative Neurologic: intact Heart: regular rate and rhythm, S1, S2 normal, no murmur, click, rub or gallop, valve sounds are crisp without murmur of aortic insufficiency Lungs: clear to auscultation bilaterally Abdomen: soft, non-tender; bowel sounds normal; no masses,  no organomegaly Extremities: extremities normal, atraumatic, no cyanosis or edema and Homans sign is negative, no sign of DVT Wound: Sternum is well healed   Diagnostic Studies & Laboratory data:     Recent Radiology Findings:  Ct Angio Chest Aorta W/cm &/or Wo/cm  Result Date: 04/13/2016 CLINICAL DATA:  Follow-up thoracic aortic aneurysm. History of open heart surgery. EXAM: CT ANGIOGRAPHY CHEST WITH CONTRAST TECHNIQUE: Multidetector CT imaging of the chest was performed using the standard protocol during bolus administration of intravenous contrast. Multiplanar CT image  reconstructions and MIPs were obtained to evaluate the vascular anatomy. CONTRAST:  6mL ISOVUE-300 IOPAMIDOL (ISOVUE-300) INJECTION 61% COMPARISON:  08/27/2015; 03/31/2011 FINDINGS: Vascular Findings: Post median sternotomy, CABG, open aortic valve and ascending thoracic aortic repair. The patient is undergone open aortic valve repair as well as replacement of the ascending thoracic aorta without evidence of complication, specifically, no evidence of extravasation or vessel dissection. There is grossly unchanged aneurysmal dilatation of the residual ascending thoracic aorta as well as the proximal aspect of the aortic arch with measurements as follows. The thoracic aorta tapers to a normal caliber at the level of the mid/ distal aspect of the aortic arch. Minimal amount of slightly irregular mixed calcified and noncalcified atherosclerotic plaque within normal caliber descending thoracic aorta. Bovine configuration of the aortic arch. The branch vessels of the aortic arch appear widely patent throughout  their imaged course. Borderline cardiomegaly. Calcifications within the native coronary arteries. Trace amount of pericardial fluid, presumably physiologic. Although this examination was not tailored for the evaluation the pulmonary arteries, there are no discrete filling defects within the central pulmonary arterial tree to suggest central pulmonary embolism. Normal caliber the main pulmonary artery. ------------------------------------------------------------- Thoracic aortic measurements: Sinotubular junction 34 mm as measured in greatest oblique coronal dimension. Proximal ascending aorta 36 mm as measured in greatest oblique axial dimension at the level of the main pulmonary artery. 44 mm at the level of the more cranial ascending thoracic aorta (sagittal image 79, series 602), unchanged since the 2013 examination, previously, 47 mm Aortic arch aorta 30 mm as measured in greatest oblique sagittal dimension.  Proximal descending thoracic aorta 28 mm as measured in greatest oblique axial dimension at the level of the main pulmonary artery. Distal descending thoracic aorta 23 mm as measured in greatest oblique axial dimension at the level of the diaphragmatic hiatus. Review of the MIP images confirms the above findings. ------------------------------------------------------------- Non-Vascular Findings: Mediastinum/Lymph Nodes: No bulky mediastinal, hilar axillary lymphadenopathy. Lungs/Pleura: Slightly nodular pleuroparenchymal thickening about the anterior nondependent portion of the bilateral upper lobes with associated scattered pleural calcifications are similar to the 03/2011 examination. Punctate (approximately 0.4 cm) noncalcified subpleural nodule within the superior segment of the right lower lobe (image 49, series 4), similar to the 03/2011 examination. No new focal airspace opacities. No pleural effusion or pneumothorax. The central pulmonary airways appear widely patent. 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 Stigmata of DISH within the caudal aspect of the thoracic spine. Mild bilateral gynecomastia.  Regional soft tissues appear normal. IMPRESSION: 1. Post open repair of the ascending thoracic aorta without evidence of complication. 2. 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 3. Stable pleural calcifications, likely the sequela of prior asbestos exposure and similar to the 03/2011 examination. 4. 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 5.   Aortic Atherosclerosis (ICD10-170.0) 6.  Aortic aneurysm NOS (ICD10-I71.9) Electronically Signed   By: Sandi Mariscal M.D.   On: 04/13/2016 15:45     Recent Lab Findings: Lab Results  Component Value Date   WBC 12.2 (H) 03/24/2016   HGB 12.1 (L) 03/24/2016   HCT 36.7 (L) 03/24/2016   PLT 251.0 03/24/2016   GLUCOSE 89 03/24/2016   CHOL 176 03/24/2016   TRIG 60.0 03/24/2016   HDL 77.80 03/24/2016   LDLDIRECT 112.6 05/14/2012   LDLCALC 87 03/24/2016   ALT 12 03/24/2016   AST 16 03/24/2016   NA 140 03/24/2016   K 4.5 03/24/2016   CL 102 03/24/2016   CREATININE 0.96 03/24/2016   BUN 21 03/24/2016   CO2 30 03/24/2016   TSH 3.09 03/24/2016   INR 2.52 11/24/2015   HGBA1C 7.4 (H) 03/24/2016      Assessment / Plan:     Patient stable following replacement of his aortic valve and ascending aorta in September 2017. Patient had significant GI complaints likely associated with amiodarone therapy, they have resolved since he stopped taking amiodarone With his GI workup the patient did have a upper  GI endoscopy done 12/01/2005, no abnormalities in the stomach were noted We'll plan to see him back in one year with CTA of the chest   I did discuss with the patient endocarditis prophylaxis need.  [name] MD      [address] [place] [address] Office [phone]   Beeper [phone]  04/13/2016 4:17 PM

## 2016-04-14 ENCOUNTER — Encounter (HOSPITAL_COMMUNITY)
Admission: RE | Admit: 2016-04-14 | Discharge: 2016-04-14 | Disposition: A | Discharge status: Home / Self Care | Payer: Medicare Other | Source: Ambulatory Visit | Attending: Cardiovascular Disease | Admitting: Cardiovascular Disease

## 2016-04-14 ENCOUNTER — Encounter: Payer: Self-pay | Admitting: Cardiovascular Disease

## 2016-04-14 DIAGNOSIS — Z952 Presence of prosthetic heart valve: Secondary | ICD-10-CM | POA: Insufficient documentation

## 2016-04-14 DIAGNOSIS — Z951 Presence of aortocoronary bypass graft: Secondary | ICD-10-CM | POA: Diagnosis not present

## 2016-04-17 ENCOUNTER — Encounter (HOSPITAL_COMMUNITY)
Admission: RE | Admit: 2016-04-17 | Discharge: 2016-04-17 | Disposition: A | Discharge status: Home / Self Care | Payer: Medicare Other | Source: Ambulatory Visit | Attending: Cardiovascular Disease | Admitting: Cardiovascular Disease

## 2016-04-17 DIAGNOSIS — Z952 Presence of prosthetic heart valve: Secondary | ICD-10-CM

## 2016-04-17 DIAGNOSIS — Z951 Presence of aortocoronary bypass graft: Secondary | ICD-10-CM

## 2016-04-17 NOTE — Progress Notes (Signed)
[name] 71 y.o. male Nutrition Note Spoke with pt. Pt's pre-exercise CBG was 94 mg/dL on his personal glucometer. Per protocol, pt CBG must be > 110 mg/dL for pt on insulin to exercise. Pt states he ate a hot dog, a few shoe string fries, and a couple of sips of a Diet Dr. [name]. Pt's A1c noted to have increased from the last A1c of 6.9 (3 months ago) per NP note 03/31/16. Suspect A1c may have been low due to intensive insulin regimen used post-operatively. Pt's Lantus increased to 19 units per day, which medication review shows pt was reportedly taking anyway. Pt continues to take Metformin BID. Pt does not appear to have changed his diet at this time despite NP encouragement to do so. Pt expressed understanding of the information reviewed. Pt aware of nutrition education classes offered.  Lab Results  Component Value Date   HGBA1C 7.4 (H) 03/24/2016   Wt Readings from Last 3 Encounters:  04/13/16 184 lb 12.8 oz (83.8 kg)  03/31/16 184 lb 3.2 oz (83.6 kg)  02/01/16 179 lb 10.8 oz (81.5 kg)   Nutrition Diagnosis ? Food-and nutrition-related knowledge deficit related to lack of exposure to information as related to diagnosis of: ? CVD ? DM Nutrition Intervention ? Pt's individual nutrition plan reviewed with pt. ? Pt to try holding Metformin on the days he comes to Cardiac Rehab. Will notify NP re: results prn.  ? Pt to attend the Portion Distortion class - met 04/12/16 ? Pt to attend the Diabetes Q & A class - met 03/24/16 ? Continue client-centered nutrition education by RD, as part of interdisciplinary care. Goal(s) ? Pt to identify food quantities necessary to achieve weight loss of 6-24 lb (2.7-10.9 kg) at graduation from cardiac rehab.  ? Use pre-meal and post-meal CBG's and A1c to determine whether adjustments in food/meal planning will be beneficial or if any meds need to be combined with nutrition therapy. Monitor and Evaluate progress toward nutrition goal with team. [name], M.Ed, RD, LDN, CDE 04/17/2016 2:55 PM

## 2016-04-19 ENCOUNTER — Encounter (HOSPITAL_COMMUNITY)
Admission: RE | Admit: 2016-04-19 | Discharge: 2016-04-19 | Disposition: A | Discharge status: Home / Self Care | Payer: Medicare Other | Source: Ambulatory Visit | Attending: Cardiovascular Disease | Admitting: Cardiovascular Disease

## 2016-04-19 DIAGNOSIS — Z951 Presence of aortocoronary bypass graft: Secondary | ICD-10-CM

## 2016-04-19 DIAGNOSIS — Z952 Presence of prosthetic heart valve: Secondary | ICD-10-CM

## 2016-04-21 ENCOUNTER — Encounter (HOSPITAL_COMMUNITY)
Admission: RE | Admit: 2016-04-21 | Discharge: 2016-04-21 | Disposition: A | Discharge status: Home / Self Care | Payer: Medicare Other | Source: Ambulatory Visit

## 2016-04-21 DIAGNOSIS — Z952 Presence of prosthetic heart valve: Secondary | ICD-10-CM

## 2016-04-21 DIAGNOSIS — Z951 Presence of aortocoronary bypass graft: Secondary | ICD-10-CM

## 2016-04-24 ENCOUNTER — Ambulatory Visit (INDEPENDENT_AMBULATORY_CARE_PROVIDER_SITE_OTHER): Payer: Medicare Other | Admitting: Cardiovascular Disease

## 2016-04-24 ENCOUNTER — Encounter (HOSPITAL_COMMUNITY): Payer: Medicare Other

## 2016-04-24 ENCOUNTER — Encounter: Payer: Self-pay | Admitting: Cardiovascular Disease

## 2016-04-24 ENCOUNTER — Other Ambulatory Visit: Payer: Medicare Other | Admitting: *Deleted

## 2016-04-24 VITALS — BP 130/68 | HR 72 | Ht 72.0 in | Wt 185.4 lb

## 2016-04-24 DIAGNOSIS — I350 Nonrheumatic aortic (valve) stenosis: Secondary | ICD-10-CM

## 2016-04-24 DIAGNOSIS — I480 Paroxysmal atrial fibrillation: Secondary | ICD-10-CM | POA: Diagnosis not present

## 2016-04-24 DIAGNOSIS — I25810 Atherosclerosis of coronary artery bypass graft(s) without angina pectoris: Secondary | ICD-10-CM

## 2016-04-24 DIAGNOSIS — E7800 Pure hypercholesterolemia, unspecified: Secondary | ICD-10-CM

## 2016-04-24 DIAGNOSIS — I10 Essential (primary) hypertension: Secondary | ICD-10-CM

## 2016-04-24 NOTE — Patient Instructions (Signed)
Your physician recommends that you continue on your current medications as directed. Please refer to the Current Medication list given to you today.  Your physician wants you to follow-up in: 6 MONTHS WITH DR [name]  You will receive a reminder letter in the mail two months in advance. If you don't receive a letter, please call our office to schedule the follow-up appointment.  

## 2016-04-24 NOTE — Addendum Note (Signed)
Addended by: Eulis Foster on: 04/24/2016 09:16 AM   Modules accepted: Orders

## 2016-04-24 NOTE — Progress Notes (Signed)
[name] Date of Birth  04-09-1945     [address]    [address]    [place], [place]  [address]        Problems: 1. Chest pain 2. Hypercholesterolemia 3. Diabetes mellitus 4. Mild dilatation of the ascending aorta by CT scan-4.4 cm 5. Coronary calcifications by CT scan 6. Pulmonary nodule 7. Mild aortic stenosis   Previous notes.:    Bannon is a 71 year old gentleman who presents today for further evaluation of his chest pain. These chest pains are described as a left-sided pain. He seemed to come on with rest and exertion.  The pains last for hours.    He has been quite regularly.  He does not do any regular exercise.  He works as a Sports coach.  He had a stress Myoview study which was normal. His echocardiogram revealed  Left ventricle: The cavity size was normal. Wall thickness was increased in a pattern of mild LVH. Systolic function was normal. The estimated ejection fraction was in the range of 55% to 60%. 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 Mean gradient: 52mm Hg (S). Peak gradient: 61mm Hg (S). - Atrial septum: No defect or patent foramen ovale was identified.  He still has some occasional episodes of chest pain that he describes as a "pins and needles" like sensation.  He's able to get out and exercise without any problems.  Dec. 11, 2015:  Mathias is a 71 yo who I follow for episodes of chest pain, mild aortic stenosis, mild aortic dilitation He is seen back after a 2 year absence.   He is now retired from being a Media planner,  Now semi retired - works 4 hours a day.   No CP or dyspnea.   Walks occasionally ,  A recent echo showed mild AS and trace MR.  He has mild aortic dilitation.  July 23 2015:  Kaven is seen back after a 2 year absence . He's continued to have some episodes of chest pain. We performed a stress Myoview study on him in 2013 which was normal. These  episodes of pain are not associated with eating, drinking, exercise, or change of position. The pains are exacerbated by leaning forward. He's not tried any specific medications. He still works as a Sports coach at [employer] .  He notes that the chest pain will hurt if he sweeping or mopping quite a bit.  He notes some shortness of breath with exertion when he is climbing stairs. This seems to be stable.  Dec. 7, 2017:  Jesselee is seen back  Had CABG ( 4 vessel)  + AVR (23MM MAGNA EASE TISSUE VALVE. (N/A) on Sept.  left internal mammary to the left anterior descending coronary artery, sequential reverse SVG  to the first and second obtuse marginal, SVG to the distal right coronary artery  He has had multiple hospitalizations (5)  since that time for nausea and vomiting. Eventually his amiodarone was discontinued.  This seems to have helped   Hospitalization was complicated by trauma to the [place] - required surgical removal .   He was told by someone to stop his Xarelto and start an aspirin.  Took Xarelto for a month.   April 24, 2016:  Amel is seen today .  No CP or dyspnea.  Healing up from his CABG in Sept. 2017.   Current Outpatient Prescriptions on File Prior  to Visit  Medication Sig Dispense Refill  . aspirin EC 81 MG tablet Take 81 mg by mouth daily.    [name] Kitchen atorvastatin (LIPITOR) 40 MG tablet Take 1 tablet (40 mg total) by mouth daily. 30 tablet 11  . B-D ULTRAFINE III SHORT PEN 31G X 8 MM MISC USE 4 TIMES DAILY AS DIRECTED 100 each 0  . ferrous gluconate (FERGON) 324 MG tablet Take 1 tablet (324 mg total) by mouth daily with breakfast. 90 tablet 3  . folic acid (FOLVITE) 1 MG tablet Take 1 tablet (1 mg total) by mouth daily. 30 tablet 1  . glucose blood test strip Use as instructed 100 each 12  . Insulin Glargine (LANTUS SOLOSTAR) 100 UNIT/ML Solostar Pen Inject 15 Units into the skin daily at 10 pm. (Patient taking differently: Inject 19 Units into the skin  daily at 10 pm. ) 5 pen 5  . metFORMIN (GLUCOPHAGE) 1000 MG tablet Take 1 tablet (1,000 mg total) by mouth 2 (two) times daily with a meal. 180 tablet 1  . metoprolol tartrate (LOPRESSOR) 25 MG tablet Take 0.5 tablets (12.5 mg total) by mouth 2 (two) times daily. 60 tablet 0  . ondansetron (ZOFRAN ODT) 4 MG disintegrating tablet Take 1 tablet (4 mg total) by mouth every 8 (eight) hours as needed for nausea or vomiting. 30 tablet 0   No current facility-administered medications on file prior to visit.     Allergies  Allergen Reactions  . Amiodarone Nausea And Vomiting    Past Medical History:  Diagnosis Date  . 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 Carpal tunnel syndrome 09/24/2014   Bilateral  . 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 Diabetes mellitus, type 2 ([place])   . Erectile dysfunction   . Essential hypertension   . Hyperlipidemia     Past Surgical History:  Procedure Laterality Date  . AORTIC VALVE REPLACEMENT N/A 10/29/2015   Procedure: AORTIC VALVE REPLACEMENT (AVR) WITH 23MM MAGNA EASE TISSUE VALVE.;  Surgeon: [name], MD;  Location: [facility];  Service: Open Heart Surgery;  Laterality: N/A;  . CARDIAC CATHETERIZATION N/A 10/27/2015   Procedure: Left Heart Cath and Coronary Angiography;  Surgeon: [name], MD;  Location: [facility];  Service: Cardiovascular;  Laterality: N/A;  . CORONARY ARTERY BYPASS GRAFT N/A 10/29/2015   Procedure: CORONARY ARTERY BYPASS GRAFTING (CABG) x Four UTILIZING THE LEFT INTERNAL MAMMARY ARTERY AND ENDOSCOPICALLY HARVESTED RIGHT SAPEHENEOUS VEINS.;  Surgeon: [name], MD;  Location: [facility];  Service: Open Heart Surgery;  Laterality: N/A;  . CYST REMOVAL HAND Right   . ESOPHAGOGASTRODUODENOSCOPY N/A 12/02/2015   Procedure: ESOPHAGOGASTRODUODENOSCOPY (EGD);  Surgeon: [name], MD;  Location: [facility];  Service: Gastroenterology;  Laterality:  N/A;  . REPLACEMENT ASCENDING AORTA N/A 10/29/2015   Procedure: REPLACEMENT OF ASCENDING AORTA USING 34MM X 30CM WOVEN DOUBLE VELOUR VASCULAR GRAFT.;  Surgeon: [name], MD;  Location: [facility];  Service: Open Heart Surgery;  Laterality: N/A;  . TEE WITHOUT CARDIOVERSION N/A 10/29/2015   Procedure: TRANSESOPHAGEAL ECHOCARDIOGRAM (TEE);  Surgeon: [name], MD;  Location: [facility];  Service: Open Heart Surgery;  Laterality: N/A;  . UVULECTOMY N/A 12/10/2015   Procedure: UVULECTOMY;  Surgeon: [name], MD;  Location: [facility];  Service: ENT;  Laterality: N/A;    History  Smoking Status  . Never Smoker  Smokeless Tobacco  . Never  Used    History  Alcohol Use No    Family History  Problem Relation Age of Onset  . Cancer Father     Throat cancer  . Diabetes Sister     Reviw of Systems:  Reviewed in the HPI.  All other systems are negative.  Physical Exam: Blood pressure 130/68, pulse 72, height 6' (1.829 m), weight 185 lb 6.4 oz (84.1 kg). General: Well developed, well nourished, in no acute distress.  Head: Normocephalic, atraumatic, sclera non-icteric, mucus membranes are moist,   Neck: Supple. Carotids are 2 + without bruits ( there is radiation of the systolic murmur ) . No JVD  Lungs: Clear bilaterally to auscultation.  Heart: regular rate  There is a 1-0/6 systolic murmur with radiation up into both carotids .   Slightly blunted radial pulses.   Abdomen: Soft, non-tender, non-distended with normal bowel sounds. No hepatomegaly. No rebound/guarding. No masses.  Msk:  Strength and tone are normal  Extremities: No clubbing or cyanosis. No edema.  Distal pedal pulses are 2+ and equal bilaterally.  Neuro: Alert and oriented X 3. Moves all extremities spontaneously.  Psych:  Responds to questions appropriately with a normal affect.  ECG: July 23, 2015:   NSR at 72.  Normal .   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  Ascending aortic  aneurism:   4.5 cm by last echo. Following .   3. CAD - s/p CABG:   He's feeling quite a bit better.  4. Paroxysmal atrial fib:  . He had atrial fib following his surgery. We tried him on amiodarone but that apparently also a caused him to have severe nausea and vomiting. He was admitted to the hospital on several occasions for this nausea. He felt better after we discontinued the amiodarone.   He is on aspirin.  5. Hyperlipidemia: will check labs at next visit .  6. Diabetes mellitus: Continue with his current medications. He'll continue to follow-up with Dr. Buddy Duty .  6. HTN:  Stable    Mertie Moores, MD  04/24/2016 9:45 AM    [address] [facility] [address],  [place], [place]  [address] Pager [phone] Phone: [phone]; Fax: [phone]

## 2016-04-26 ENCOUNTER — Encounter (HOSPITAL_COMMUNITY)
Admission: RE | Admit: 2016-04-26 | Discharge: 2016-04-26 | Disposition: A | Discharge status: Home / Self Care | Payer: Medicare Other | Source: Ambulatory Visit | Attending: Cardiovascular Disease | Admitting: Cardiovascular Disease

## 2016-04-26 DIAGNOSIS — Z951 Presence of aortocoronary bypass graft: Secondary | ICD-10-CM

## 2016-04-26 DIAGNOSIS — Z952 Presence of prosthetic heart valve: Secondary | ICD-10-CM

## 2016-04-28 ENCOUNTER — Telehealth (HOSPITAL_COMMUNITY): Payer: Self-pay | Admitting: *Deleted

## 2016-04-28 ENCOUNTER — Encounter (HOSPITAL_COMMUNITY): Admission: RE | Admit: 2016-04-28 | Payer: Medicare Other | Source: Ambulatory Visit

## 2016-05-01 ENCOUNTER — Encounter (HOSPITAL_COMMUNITY): Payer: Medicare Other

## 2016-05-03 ENCOUNTER — Encounter (HOSPITAL_COMMUNITY)
Admission: RE | Admit: 2016-05-03 | Discharge: 2016-05-03 | Disposition: A | Discharge status: Home / Self Care | Payer: Medicare Other | Source: Ambulatory Visit

## 2016-05-05 ENCOUNTER — Telehealth (HOSPITAL_COMMUNITY): Payer: Self-pay | Admitting: *Deleted

## 2016-05-05 ENCOUNTER — Encounter (HOSPITAL_COMMUNITY): Admission: RE | Admit: 2016-05-05 | Payer: Medicare Other | Source: Ambulatory Visit

## 2016-05-08 ENCOUNTER — Encounter (HOSPITAL_COMMUNITY)
Admission: RE | Admit: 2016-05-08 | Discharge: 2016-05-08 | Disposition: A | Discharge status: Home / Self Care | Payer: Medicare Other | Source: Ambulatory Visit | Attending: Cardiovascular Disease | Admitting: Cardiovascular Disease

## 2016-05-08 DIAGNOSIS — Z951 Presence of aortocoronary bypass graft: Secondary | ICD-10-CM | POA: Diagnosis not present

## 2016-05-08 DIAGNOSIS — Z952 Presence of prosthetic heart valve: Secondary | ICD-10-CM

## 2016-05-10 ENCOUNTER — Encounter (HOSPITAL_COMMUNITY)
Admission: RE | Admit: 2016-05-10 | Discharge: 2016-05-10 | Disposition: A | Discharge status: Home / Self Care | Payer: Medicare Other | Source: Ambulatory Visit | Attending: Cardiovascular Disease | Admitting: Cardiovascular Disease

## 2016-05-10 DIAGNOSIS — Z951 Presence of aortocoronary bypass graft: Secondary | ICD-10-CM | POA: Diagnosis not present

## 2016-05-10 DIAGNOSIS — Z952 Presence of prosthetic heart valve: Secondary | ICD-10-CM

## 2016-05-10 NOTE — Progress Notes (Signed)
Cardiac Individual Treatment Plan  Patient Details  Name: [name] MRN: [id number] Date of Birth: Jul 09, 1945 Referring Provider:     CARDIAC REHAB PHASE II ORIENTATION from 02/01/2016 in [place]  Referring Provider  Mertie Moores MD      Initial Encounter Date:    CARDIAC REHAB PHASE II ORIENTATION from 02/01/2016 in [place]  Date  02/01/16  Referring Provider  [name], Arnette Norris MD      Visit Diagnosis: S/P CABG x 4  S/P AVR (aortic valve replacement)  Patient's Home Medications on Admission:  Current Outpatient Prescriptions:  .  aspirin EC 81 MG tablet, Take 81 mg by mouth daily., Disp: , Rfl:  .  atorvastatin (LIPITOR) 40 MG tablet, Take 1 tablet (40 mg total) by mouth daily., Disp: 30 tablet, Rfl: 11 .  B-D ULTRAFINE III SHORT PEN 31G X 8 MM MISC, USE 4 TIMES DAILY AS DIRECTED, Disp: 100 each, Rfl: 0 .  ferrous gluconate (FERGON) 324 MG tablet, Take 1 tablet (324 mg total) by mouth daily with breakfast., Disp: 90 tablet, Rfl: 3 .  folic acid (FOLVITE) 1 MG tablet, Take 1 tablet (1 mg total) by mouth daily., Disp: 30 tablet, Rfl: 1 .  glucose blood test strip, Use as instructed, Disp: 100 each, Rfl: 12 .  Insulin Glargine (LANTUS SOLOSTAR) 100 UNIT/ML Solostar Pen, Inject 15 Units into the skin daily at 10 pm. (Patient taking differently: Inject 19 Units into the skin daily at 10 pm. ), Disp: 5 pen, Rfl: 5 .  metFORMIN (GLUCOPHAGE) 1000 MG tablet, Take 1 tablet (1,000 mg total) by mouth 2 (two) times daily with a meal., Disp: 180 tablet, Rfl: 1 .  metoprolol tartrate (LOPRESSOR) 25 MG tablet, Take 0.5 tablets (12.5 mg total) by mouth 2 (two) times daily., Disp: 60 tablet, Rfl: 0 .  ondansetron (ZOFRAN ODT) 4 MG disintegrating tablet, Take 1 tablet (4 mg total) by mouth every 8 (eight) hours as needed for nausea or vomiting., Disp: 30 tablet, Rfl: 0  Past Medical History: Past Medical History:  Diagnosis  Date  . 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 Carpal tunnel syndrome 09/24/2014   Bilateral  . 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 Diabetes mellitus, type 2 ([place])   . Erectile dysfunction   . Essential hypertension   . Hyperlipidemia     Tobacco Use: History  Smoking Status  . Never Smoker  Smokeless Tobacco  . Never Used    Labs: Recent Review Flowsheet Data    Labs for ITP Cardiac and Pulmonary Rehab Latest Ref Rng & Units 10/30/2015 10/30/2015 10/31/2015 12/06/2015 03/24/2016   Cholestrol 0 - 200 mg/dL - - - - 176   LDLCALC 0 - 99 mg/dL - - - - 87   LDLDIRECT mg/dL - - - - -   HDL >39.00 mg/dL - - - - 77.80   Trlycerides 0.0 - 149.0 mg/dL - - - - 60.0   Hemoglobin A1c 4.6 - 6.5 % - - - - 7.4(H)   PHART 7.350 - 7.450 - - - - -   PCO2ART 32.0 - 48.0 mmHg - - - - -   HCO3 20.0 - 28.0 mmol/L 23.0 - - - -   TCO2 0 - 100 mmol/L _0 -   ACIDBASEDEF 0.0 - 2.0 mmol/L 2.0 - - - -   O2SAT %  98.0 - - - -      Capillary Blood Glucose: Lab Results  Component Value Date   GLUCAP 120 (H) 04/05/2016   GLUCAP 94 03/10/2016   GLUCAP 181 (H) 02/28/2016   GLUCAP 153 (H) 02/25/2016   GLUCAP 110 (H) 02/25/2016     Exercise Target Goals:    Exercise Program Goal: Individual exercise prescription set with THRR, safety & activity barriers. Participant demonstrates ability to understand and report RPE using BORG scale, to self-measure pulse accurately, and to acknowledge the importance of the exercise prescription.  Exercise Prescription Goal: Starting with aerobic activity 30 plus minutes a day, 3 days per week for initial exercise prescription. Provide home exercise prescription and guidelines that participant acknowledges understanding prior to discharge.  Activity Barriers & Risk Stratification:     Activity Barriers & Cardiac Risk Stratification - 02/01/16 1634      Activity Barriers & Cardiac Risk  Stratification   Activity Barriers None   Cardiac Risk Stratification High      6 Minute Walk:     6 Minute Walk    Row Name 02/01/16 1635 02/01/16 1638       6 Minute Walk   Phase Initial  -    Distance 1116 feet  -    Walk Time 6 minutes  -    # of Rest Breaks 0  -    MPH  - 2.12    METS  - 3.6    RPE 11  -    VO2 Peak  - 12.74    Symptoms No  -    Resting HR 83 bpm  -    Resting BP 114/70  -    Max Ex. HR 99 bpm  -    Max Ex. BP 108/70  -    2 Minute Post BP 108/64  -       Oxygen Initial Assessment:   Oxygen Re-Evaluation:   Oxygen Discharge (Final Oxygen Re-Evaluation):   Initial Exercise Prescription:     Initial Exercise Prescription - 02/01/16 1600      Date of Initial Exercise RX and Referring Provider   Date 02/01/16   Referring Provider [name], Philip MD     Treadmill   MPH 2   Grade 0   Minutes 10   METs 2.53     Bike   Level 2  upright scifit bike   Minutes 10   METs 2     NuStep   Level 2   Minutes 10   METs 2     Prescription Details   Frequency (times per week) 3   Duration Progress to 30 minutes of continuous aerobic without signs/symptoms of physical distress     Intensity   THRR 40-80% of Max Heartrate 61-122   Ratings of Perceived Exertion 11-13   Perceived Dyspnea 0-4     Progression   Progression Continue progressive overload as per policy without signs/symptoms or physical distress.     Resistance Training   Training Prescription Yes   Weight 2lbs   Reps 10-12      Perform Capillary Blood Glucose checks as needed.  Exercise Prescription Changes:     Exercise Prescription Changes    Row Name 03/13/16 1600 04/12/16 1400 05/01/16 1700         Response to Exercise   Blood Pressure (Admit) 118/68 116/72 150/92     Blood Pressure (Exercise) 148/68 146/70 174/82     Blood Pressure (Exit) 104/64 120/80  108/62     Heart Rate (Admit) 91 bpm 81 bpm 86 bpm     Heart Rate (Exercise) 126 bpm 140 bpm 157 bpm      Heart Rate (Exit) 96 bpm 103 bpm 95 bpm     Rating of Perceived Exertion (Exercise) _0 Duration Progress to 45 minutes of aerobic exercise without signs/symptoms of physical distress Progress to 45 minutes of aerobic exercise without signs/symptoms of physical distress Progress to 45 minutes of aerobic exercise without signs/symptoms of physical distress     Intensity THRR unchanged THRR unchanged THRR unchanged       Progression   Progression Continue to progress workloads to maintain intensity without signs/symptoms of physical distress. Continue to progress workloads to maintain intensity without signs/symptoms of physical distress. Continue to progress workloads to maintain intensity without signs/symptoms of physical distress.     Average METs 5.9 6.7 7.3       Resistance Training   Training Prescription Yes Yes Yes     Weight 6lb 8lb 8lb     Reps 10-12 10-15 10-15       Treadmill   MPH 3 3.1 3.3     Grade _1 Minutes _2 METs 4.12 4.23 4.43       Bike   Level 3.5  upright scifit bike 3.5  upright scifit bike 3.8  upright scifit bike     Minutes _3 METs 6.3 6.7 8.8       NuStep   Level _4 Minutes _5 METs 7.2 9.3 8.8       Home Exercise Plan   Plans to continue exercise at [place] (comment) Home (comment)     Frequency Add 4 additional days to program exercise sessions. Add 4 additional days to program exercise sessions. Add 4 additional days to program exercise sessions.        Exercise Comments:     Exercise Comments    Row Name 03/13/16 1622 04/12/16 1427 05/08/16 1633       Exercise Comments Reviewed METs and goals with pt.  Pt is dong great with exercise! Reviewed METs and goals with pt.  Pt is doing excellent with exercise! Reviewed METs and goals with pt. Pt continues to do great with exercise.         Exercise Goals and Review:     Exercise Goals    Row Name 04/12/16 1422             Exercise  Goals   Increase Physical Activity Yes       Intervention Provide advice, education, support and counseling about physical activity/exercise needs.;Develop an individualized exercise prescription for aerobic and resistive training based on initial evaluation findings, risk stratification, comorbidities and participant's personal goals.       Expected Outcomes Achievement of increased cardiorespiratory fitness and enhanced flexibility, muscular endurance and strength shown through measurements of functional capacity and personal statement of participant.       Increase Strength and Stamina Yes       Intervention Provide advice, education, support and counseling about physical activity/exercise needs.;Develop an individualized exercise prescription for aerobic and resistive training based on initial evaluation findings, risk stratification, comorbidities and participant's personal goals.       Expected Outcomes Achievement of increased cardiorespiratory fitness and enhanced  flexibility, muscular endurance and strength shown through measurements of functional capacity and personal statement of participant.          Exercise Goals Re-Evaluation :     Exercise Goals Re-Evaluation    Row Name 04/12/16 1422 04/12/16 1423 05/08/16 1632         Exercise Goal Re-Evaluation   Exercise Goals Review Increase Physical Activity;Increase Strenth and Stamina  - Increase Physical Activity;Increase Strenth and Stamina     Comments Pt has improved tremendously since beginning the program.   Pt has improved tremendously since beginning the program and he is gradually increasing his MET level each week.  He is handling workload increases very well and he is walking everyday at home for 20-30 minutes Pt is doing outstanding with exercise.  His MET level has improved by approximately 2 METs within the past month.  He works hard when he comes to CR and he is walking at home everyday for 30 min/day.     Expected Outcomes   - Continue with CR exercise prescription and HEP in order to progressively increase cardiorespiratory fitness and strength.   Continue with CR exercise prescription and HEP in order to continue to increase cardiorespiratory fitness and strength.           Discharge Exercise Prescription (Final Exercise Prescription Changes):     Exercise Prescription Changes - 05/01/16 1700      Response to Exercise   Blood Pressure (Admit) 150/92   Blood Pressure (Exercise) 174/82   Blood Pressure (Exit) 108/62   Heart Rate (Admit) 86 bpm   Heart Rate (Exercise) 157 bpm   Heart Rate (Exit) 95 bpm   Rating of Perceived Exertion (Exercise) 7   Duration Progress to 45 minutes of aerobic exercise without signs/symptoms of physical distress   Intensity THRR unchanged     Progression   Progression Continue to progress workloads to maintain intensity without signs/symptoms of physical distress.   Average METs 7.3     Resistance Training   Training Prescription Yes   Weight 8lb   Reps 10-15     Treadmill   MPH 3.3   Grade 2   Minutes 10   METs 4.43     Bike   Level 3.8  upright scifit bike   Minutes 10   METs 8.8     NuStep   Level 5   Minutes 10   METs 8.8     Home Exercise Plan   Plans to continue exercise at Home (comment)   Frequency Add 4 additional days to program exercise sessions.      Nutrition:  Target Goals: Understanding of nutrition guidelines, daily intake of sodium <1528m, cholesterol <2033m calories 30% from fat and 7% or less from saturated fats, daily to have 5 or more servings of fruits and vegetables.  Biometrics:     Pre Biometrics - 02/01/16 1636      Pre Biometrics   Waist Circumference 34 inches   Hip Circumference 37.5 inches   Waist to Hip Ratio 0.91 %   Triceps Skinfold 17 mm   % Body Fat 24.1 %   Grip Strength 41 kg   Flexibility 7.5 in   Single Leg Stand 30 seconds       Nutrition Therapy Plan and Nutrition Goals:     Nutrition  Therapy & Goals - 03/15/16 1617      Nutrition Therapy   Diet Carb Modified, Therapeutic Lifestyle Changes     Personal Nutrition Goals  Nutrition Goal Maintain wt while in Cardiac Rehab     Intervention Plan   Intervention Prescribe, educate and counsel regarding individualized specific dietary modifications aiming towards targeted core components such as weight, hypertension, lipid management, diabetes, heart failure and other comorbidities.   Expected Outcomes Short Term Goal: Understand basic principles of dietary content, such as calories, fat, sodium, cholesterol and nutrients.;Long Term Goal: Adherence to prescribed nutrition plan.      Nutrition Discharge: Nutrition Scores:     Nutrition Assessments - 03/15/16 1617      MEDFICTS Scores   Pre Score 32      Nutrition Goals Re-Evaluation:   Nutrition Goals Re-Evaluation:   Nutrition Goals Discharge (Final Nutrition Goals Re-Evaluation):   Psychosocial: Target Goals: Acknowledge presence or absence of significant depression and/or stress, maximize coping skills, provide positive support system. Participant is able to verbalize types and ability to use techniques and skills needed for reducing stress and depression.  Initial Review & Psychosocial Screening:     Initial Psych Review & Screening - 02/01/16 [place]? Yes   Comments  brief psychosocial assesment reveals no furhter intervention needed at this time     Barriers   Psychosocial barriers to participate in program There are no identifiable barriers or psychosocial needs.     Screening Interventions   Interventions Encouraged to exercise      Quality of Life Scores:     Quality of Life - 02/28/16 1629      Quality of Life Scores   Family Pre --  worried about Son's HTN says he is better now.      PHQ-9: Recent Review Flowsheet Data    Depression screen [facility] 2/9 02/16/2016 04/03/2014 03/03/2013   Decreased  Interest 0 0 0   Down, Depressed, Hopeless 0 0 0   PHQ - 2 Score 0 0 0     Interpretation of Total Score  Total Score Depression Severity:  1-4 = Minimal depression, 5-9 = Mild depression, 10-14 = Moderate depression, 15-19 = Moderately severe depression, 20-27 = Severe depression   Psychosocial Evaluation and Intervention:   Psychosocial Re-Evaluation:     Psychosocial Re-Evaluation    Row Name 03/17/16 0906 04/13/16 1434 05/10/16 1703         Psychosocial Re-Evaluation   Current issues with  - None Identified None Identified     Interventions Encouraged to attend Cardiac Rehabilitation for the exercise Encouraged to attend Cardiac Rehabilitation for the exercise Encouraged to attend Cardiac Rehabilitation for the exercise;Stress management education     Continue Psychosocial Services  No No Follow up required No Follow up required        Psychosocial Discharge (Final Psychosocial Re-Evaluation):     Psychosocial Re-Evaluation - 05/10/16 1703      Psychosocial Re-Evaluation   Current issues with None Identified   Interventions Encouraged to attend Cardiac Rehabilitation for the exercise;Stress management education   Continue Psychosocial Services  No Follow up required      Vocational Rehabilitation: Provide vocational rehab assistance to qualifying candidates.   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 Participant will state understanding/return demonstration of topics presented.  Learning Barriers/Preferences:     Learning Barriers/Preferences - 02/01/16 1635      Learning Barriers/Preferences   Learning  Barriers Sight   Learning Preferences Audio;Video      Education Topics: Count Your Pulse:  -Group instruction provided by verbal instruction, demonstration, patient participation and written materials to support subject.  Instructors address importance of being able to find your pulse and how to count your pulse when at home without a heart monitor.  Patients get hands on experience counting their pulse with staff help and individually.   CARDIAC REHAB PHASE II EXERCISE from 04/26/2016 in [place]  Date  02/18/16  Educator  Maurice Small, RN  Instruction Review Code  2- meets goals/outcomes      Heart Attack, Angina, and Risk Factor Modification:  -Group instruction provided by verbal instruction, video, and written materials to support subject.  Instructors address signs and symptoms of angina and heart attacks.    Also discuss risk factors for heart disease and how to make changes to improve heart health risk factors.   CARDIAC REHAB PHASE II EXERCISE from 05/10/2016 in [place]  Date  05/10/16  Instruction Review Code  2- meets goals/outcomes      Functional Fitness:  -Group instruction provided by verbal instruction, demonstration, patient participation, and written materials to support subject.  Instructors address safety measures for doing things around the house.  Discuss how to get up and down off the floor, how to pick things up properly, how to safely get out of a chair without assistance, and balance training.   Meditation and Mindfulness:  -Group instruction provided by verbal instruction, patient participation, and written materials to support subject.  Instructor addresses importance of mindfulness and meditation practice to help reduce stress and improve awareness.  Instructor also leads participants through a meditation exercise.    Stretching for Flexibility and Mobility:  -Group instruction  provided by verbal instruction, patient participation, and written materials to support subject.  Instructors lead participants through series of stretches that are designed to increase flexibility thus improving mobility.  These stretches are additional exercise for major muscle groups that are typically performed during regular warm up and cool down.   Hands Only CPR Anytime:  -Group instruction provided by verbal instruction, video, patient participation and written materials to support subject.  Instructors co-teach with AHA video for hands only CPR.  Participants get hands on experience with mannequins.   Nutrition I class: Heart Healthy Eating:  -Group instruction provided by PowerPoint slides, verbal discussion, and written materials to support subject matter. The instructor gives an explanation and review of the Therapeutic Lifestyle Changes diet recommendations, which includes a discussion on lipid goals, dietary fat, sodium, fiber, plant stanol/sterol esters, sugar, and the components of a well-balanced, healthy diet.   CARDIAC REHAB PHASE II EXERCISE from 04/26/2016 in [place]  Date  03/15/16  Educator  RD  Instruction Review Code  Not applicable [Class handouts given]      Nutrition II class: Lifestyle Skills:  -Group instruction provided by PowerPoint slides, verbal discussion, and written materials to support subject matter. The instructor gives an explanation and review of label reading, grocery shopping for heart health, heart healthy recipe modifications, and ways to make healthier choices when eating out.   CARDIAC REHAB PHASE II EXERCISE from 04/26/2016 in [place]  Date  03/15/16  Educator  RD  Instruction Review Code  Not applicable [Class handouts given]      Diabetes Question & Answer:  -Group instruction provided by PowerPoint slides, verbal discussion, and written materials to support subject matter.  The instructor gives an explanation and review of diabetes co-morbidities, pre- and post-prandial blood glucose goals, pre-exercise blood glucose goals, signs, symptoms, and treatment of hypoglycemia and hyperglycemia, and foot care basics.   CARDIAC REHAB PHASE II EXERCISE from 04/26/2016 in [place]  Date  03/24/16  Educator  RD  Instruction Review Code  2- meets goals/outcomes      Diabetes Blitz:  -Group instruction provided by PowerPoint slides, verbal discussion, and written materials to support subject matter. The instructor gives an explanation and review of the physiology behind type 1 and type 2 diabetes, diabetes medications and rational behind using different medications, pre- and post-prandial blood glucose recommendations and Hemoglobin A1c goals, diabetes diet, and exercise including blood glucose guidelines for exercising safely.    CARDIAC REHAB PHASE II EXERCISE from 04/26/2016 in [place]  Date  03/15/16  Educator  RD  Instruction Review Code  Not applicable [Class handouts given]      Portion Distortion:  -Group instruction provided by PowerPoint slides, verbal discussion, written materials, and food models to support subject matter. The instructor gives an explanation of serving size versus portion size, changes in portions sizes over the last 20 years, and what consists of a serving from each food group.   CARDIAC REHAB PHASE II EXERCISE from 04/26/2016 in [place]  Date  04/12/16  Educator  RD  Instruction Review Code  2- meets goals/outcomes      Stress Management:  -Group instruction provided by verbal instruction, video, and written materials to support subject matter.  Instructors review role of stress in heart disease and how to cope with stress positively.     CARDIAC REHAB PHASE II EXERCISE from 04/26/2016 in [place]  Date   04/19/16  Instruction Review Code  2- meets goals/outcomes      Exercising on Your Own:  -Group instruction provided by verbal instruction, power point, and written materials to support subject.  Instructors discuss benefits of exercise, components of exercise, frequency and intensity of exercise, and end points for exercise.  Also discuss use of nitroglycerin and activating EMS.  Review options of places to exercise outside of rehab.  Review guidelines for sex with heart disease.   Cardiac Drugs I:  -Group instruction provided by verbal instruction and written materials to support subject.  Instructor reviews cardiac drug classes: antiplatelets, anticoagulants, beta blockers, and statins.  Instructor discusses reasons, side effects, and lifestyle considerations for each drug class.   CARDIAC REHAB PHASE II EXERCISE from 04/26/2016 in [place]  Date  03/29/16  Instruction Review Code  2- meets goals/outcomes      Cardiac Drugs II:  -Group instruction provided by verbal instruction and written materials to support subject.  Instructor reviews cardiac drug classes: angiotensin converting enzyme inhibitors (ACE-I), angiotensin II receptor blockers (ARBs), nitrates, and calcium channel blockers.  Instructor discusses reasons, side effects, and lifestyle considerations for each drug class.   CARDIAC REHAB PHASE II EXERCISE from 04/26/2016 in [place]  Date  04/26/16  Instruction Review Code  2- meets goals/outcomes  Anatomy and Physiology of the Circulatory System:  -Group instruction provided by verbal instruction, video, and written materials to support subject.  Reviews functional anatomy of heart, how it relates to various diagnoses, and what role the heart plays in the overall system.   Knowledge Questionnaire Score:     Knowledge Questionnaire Score - 02/01/16 1354      Knowledge Questionnaire Score   Pre Score  19/24      Core Components/Risk Factors/Patient Goals at Admission:     Personal Goals and Risk Factors at Admission - 02/01/16 1723      Core Components/Risk Factors/Patient Goals on Admission    Weight Management Yes;Weight Loss   Intervention Weight Management/Obesity: Establish reasonable short term and long term weight goals.;Weight Management: Develop a combined nutrition and exercise program designed to reach desired caloric intake, while maintaining appropriate intake of nutrient and fiber, sodium and fats, and appropriate energy expenditure required for the weight goal.;Weight Management: Provide education and appropriate resources to help participant work on and attain dietary goals.;Obesity: Provide education and appropriate resources to help participant work on and attain dietary goals.   Expected Outcomes Short Term: Continue to assess and modify interventions until short term weight is achieved;Long Term: Adherence to nutrition and physical activity/exercise program aimed toward attainment of established weight goal;Weight Maintenance: Understanding of the daily nutrition guidelines, which includes 25-35% calories from fat, 7% or less cal from saturated fats, less than 210m cholesterol, less than 1.5gm of sodium, & 5 or more servings of fruits and vegetables daily;Weight Loss: Understanding of general recommendations for a balanced deficit meal plan, which promotes 1-2 lb weight loss per week and includes a negative energy balance of [phone] kcal/d;Understanding recommendations for meals to include 15-35% energy as protein, 25-35% energy from fat, 35-60% energy from carbohydrates, less than 2012mof dietary cholesterol, 20-35 gm of total fiber daily;Understanding of distribution of calorie intake throughout the day with the consumption of 4-5 meals/snacks   Increase Strength and Stamina Yes   Intervention Provide advice, education, support and counseling about physical activity/exercise  needs.;Develop an individualized exercise prescription for aerobic and resistive training based on initial evaluation findings, risk stratification, comorbidities and participant's personal goals.   Expected Outcomes Achievement of increased cardiorespiratory fitness and enhanced flexibility, muscular endurance and strength shown through measurements of functional capacity and personal statement of participant.   Diabetes Yes   Intervention Provide education about signs/symptoms and action to take for hypo/hyperglycemia.;Provide education about proper nutrition, including hydration, and aerobic/resistive exercise prescription along with prescribed medications to achieve blood glucose in normal ranges: Fasting glucose 65-99 mg/dL   Expected Outcomes Short Term: Participant verbalizes understanding of the signs/symptoms and immediate care of hyper/hypoglycemia, proper foot care and importance of medication, aerobic/resistive exercise and nutrition plan for blood glucose control.;Long Term: Attainment of HbA1C < 7%.   Hypertension Yes   Intervention Provide education on lifestyle modifcations including regular physical activity/exercise, weight management, moderate sodium restriction and increased consumption of fresh fruit, vegetables, and low fat dairy, alcohol moderation, and smoking cessation.;Monitor prescription use compliance.   Expected Outcomes Short Term: Continued assessment and intervention until BP is < 140/9040mG in hypertensive participants. < 130/80m40m in hypertensive participants with diabetes, heart failure or chronic kidney disease.;Long Term: Maintenance of blood pressure at goal levels.   Lipids Yes   Intervention Provide education and support for participant on nutrition & aerobic/resistive exercise along with prescribed medications to achieve LDL <70mg35mL >40mg.40mxpected Outcomes Short Term:  Participant states understanding of desired cholesterol values and is compliant with  medications prescribed. Participant is following exercise prescription and nutrition guidelines.   Personal Goal Other Yes   Personal Goal Get better and improve heart health   Intervention Provide exercise programming to assist with improving cardiovascular fitness and functional capacity.   Expected Outcomes Pt will be able to increase exercise tolerance and improve heart health      Core Components/Risk Factors/Patient Goals Review:      Goals and Risk Factor Review    Row Name 03/13/16 1622             Core Components/Risk Factors/Patient Goals Review   Review Pt states he can tell a differnece in his energy level and he's noticed he's had more stamina throughout the day.        Expected Outcomes Continue with exercise routine and gradual workload increases in order to continuously improve cardiorespiratory fitness           Core Components/Risk Factors/Patient Goals at Discharge (Final Review):      Goals and Risk Factor Review - 03/13/16 1622      Core Components/Risk Factors/Patient Goals Review   Review Pt states he can tell a differnece in his energy level and he's noticed he's had more stamina throughout the day.    Expected Outcomes Continue with exercise routine and gradual workload increases in order to continuously improve cardiorespiratory fitness       ITP Comments:     ITP Comments    Row Name 02/01/16 1640 03/17/16 1622         ITP Comments Dr. Fransico Him, Medical Director Attended Hypertension education class, expected outcome met         Comments: Husain is making expected progress toward personal goals after completing 25 sessions. Recommend continued exercise and life style modification education including  stress management and relaxation techniques to decrease cardiac risk profile. Ewan works hard when he is here and enjoys participation in phase 2 cardiac rehab. Deitrick's home CBG's have been stable.[name], RN,BSN 05/10/2016 5:13 PM

## 2016-05-12 ENCOUNTER — Encounter (HOSPITAL_COMMUNITY)
Admission: RE | Admit: 2016-05-12 | Discharge: 2016-05-12 | Disposition: A | Discharge status: Home / Self Care | Payer: Medicare Other | Source: Ambulatory Visit | Attending: Cardiovascular Disease | Admitting: Cardiovascular Disease

## 2016-05-12 DIAGNOSIS — Z951 Presence of aortocoronary bypass graft: Secondary | ICD-10-CM

## 2016-05-12 DIAGNOSIS — Z952 Presence of prosthetic heart valve: Secondary | ICD-10-CM

## 2016-05-15 ENCOUNTER — Encounter (HOSPITAL_COMMUNITY): Payer: Medicare Other

## 2016-05-17 ENCOUNTER — Encounter (HOSPITAL_COMMUNITY): Payer: Medicare Other

## 2016-05-19 ENCOUNTER — Encounter (HOSPITAL_COMMUNITY)
Admission: RE | Admit: 2016-05-19 | Discharge: 2016-05-19 | Disposition: A | Discharge status: Home / Self Care | Payer: Medicare Other | Source: Ambulatory Visit | Attending: Cardiovascular Disease | Admitting: Cardiovascular Disease

## 2016-05-19 VITALS — Ht 70.5 in | Wt 190.7 lb

## 2016-05-19 DIAGNOSIS — Z952 Presence of prosthetic heart valve: Secondary | ICD-10-CM

## 2016-05-19 DIAGNOSIS — Z951 Presence of aortocoronary bypass graft: Secondary | ICD-10-CM | POA: Diagnosis not present

## 2016-05-19 NOTE — Progress Notes (Signed)
Discharge Summary  Patient Details  Name: [name] MRN: [id number] Date of Birth: 21-Sep-1945 Referring Provider:     CARDIAC REHAB PHASE II ORIENTATION from 02/01/2016 in [facility]  Referring Provider  [name], Arnette Norris MD       Number of Visits: 28  Reason for Discharge:  Early Exit:  Back to work  Smoking History:  History  Smoking Status  . Never Smoker  Smokeless Tobacco  . Never Used    Diagnosis:  S/P CABG x 4  S/P AVR (aortic valve replacement)  ADL UCSD:   Initial Exercise Prescription:     Initial Exercise Prescription - 02/01/16 1600      Date of Initial Exercise RX and Referring Provider   Date 02/01/16   Referring Provider [name], Arnette Norris MD     Treadmill   MPH 2   Grade 0   Minutes 10   METs 2.53     Bike   Level 2  upright scifit bike   Minutes 10   METs 2     NuStep   Level 2   Minutes 10   METs 2     Prescription Details   Frequency (times per week) 3   Duration Progress to 30 minutes of continuous aerobic without signs/symptoms of physical distress     Intensity   THRR 40-80% of Max Heartrate 61-122   Ratings of Perceived Exertion 11-13   Perceived Dyspnea 0-4     Progression   Progression Continue progressive overload as per policy without signs/symptoms or physical distress.     Resistance Training   Training Prescription Yes   Weight 2lbs   Reps 10-12      Discharge Exercise Prescription (Final Exercise Prescription Changes):     Exercise Prescription Changes - 05/19/16 1400      Response to Exercise   Blood Pressure (Admit) 118/68   Blood Pressure (Exercise) 142/80   Blood Pressure (Exit) 112/68   Heart Rate (Admit) 80 bpm   Heart Rate (Exercise) 146 bpm   Heart Rate (Exit) 77 bpm   Rating of Perceived Exertion (Exercise) 11   Duration Progress to 45 minutes of aerobic exercise without signs/symptoms of physical distress   Intensity THRR unchanged     Progression   Progression Continue to progress workloads to maintain intensity without signs/symptoms of physical distress.   Average METs 7.3     Resistance Training   Training Prescription Yes   Weight 8lb   Reps 10-15     Treadmill   MPH 3.3   Grade 6   Minutes 10   METs 6.65     Bike   Level 3.8  upright scifit bike   Minutes 10   METs 7.8     NuStep   Level 5   Minutes 10   METs 5.9     Home Exercise Plan   Plans to continue exercise at Home (comment)   Frequency Add 4 additional days to program exercise sessions.      Functional Capacity:     6 Minute Walk    Row Name 02/01/16 1635 02/01/16 1638 05/19/16 1424     6 Minute Walk   Phase Initial  - (P)  Discharge   Distance 1116 feet  - (P)  1758 feet   Distance % Change  -  - (P)  57.5 %   Walk Time 6 minutes  - (P)  6 minutes   # of Rest Breaks  0  - (P)  0   MPH  - 2.12 (P)  3.3   METS  - 3.6 (P)  4.2   RPE 11  - (P)  7   VO2 Peak  - 12.74 (P)  14.6   Symptoms No  - (P)  No   Resting HR 83 bpm  -  -   Resting BP 114/70  -  -   Max Ex. HR 99 bpm  -  -   Max Ex. BP 108/70  -  -   2 Minute Post BP 108/64  -  -   Row Name 05/19/16 1450         6 Minute Walk   Phase Discharge     Distance 1758 feet     Distance % Change 57.5 %     Walk Time 6 minutes     # of Rest Breaks 0     MPH 3.3     METS 4.2     RPE 7     VO2 Peak 14.6     Symptoms No     Resting HR 96 bpm     Resting BP 138/80     Max Ex. HR 110 bpm     Max Ex. BP 152/78     2 Minute Post BP 128/80        Psychological, QOL, Others - Outcomes: PHQ 2/9: Depression screen [facility] 2/9 05/19/2016 02/16/2016 04/03/2014 03/03/2013  Decreased Interest 0 0 0 0  Down, Depressed, Hopeless 0 0 0 0  PHQ - 2 Score 0 0 0 0    Quality of Life:     Quality of Life - 02/28/16 1629      Quality of Life Scores   Family Pre --  worried about Son's HTN says he is better now.      Personal Goals: Goals established at orientation with interventions provided to  work toward goal.     Personal Goals and Risk Factors at Admission - 02/01/16 1723      Core Components/Risk Factors/Patient Goals on Admission    Weight Management Yes;Weight Loss   Intervention Weight Management/Obesity: Establish reasonable short term and long term weight goals.;Weight Management: Develop a combined nutrition and exercise program designed to reach desired caloric intake, while maintaining appropriate intake of nutrient and fiber, sodium and fats, and appropriate energy expenditure required for the weight goal.;Weight Management: Provide education and appropriate resources to help participant work on and attain dietary goals.;Obesity: Provide education and appropriate resources to help participant work on and attain dietary goals.   Expected Outcomes Short Term: Continue to assess and modify interventions until short term weight is achieved;Long Term: Adherence to nutrition and physical activity/exercise program aimed toward attainment of established weight goal;Weight Maintenance: Understanding of the daily nutrition guidelines, which includes 25-35% calories from fat, 7% or less cal from saturated fats, less than 242m cholesterol, less than 1.5gm of sodium, & 5 or more servings of fruits and vegetables daily;Weight Loss: Understanding of general recommendations for a balanced deficit meal plan, which promotes 1-2 lb weight loss per week and includes a negative energy balance of [phone] kcal/d;Understanding recommendations for meals to include 15-35% energy as protein, 25-35% energy from fat, 35-60% energy from carbohydrates, less than 2050mof dietary cholesterol, 20-35 gm of total fiber daily;Understanding of distribution of calorie intake throughout the day with the consumption of 4-5 meals/snacks   Increase Strength and Stamina Yes   Intervention Provide advice, education, support  and counseling about physical activity/exercise needs.;Develop an individualized exercise  prescription for aerobic and resistive training based on initial evaluation findings, risk stratification, comorbidities and participant's personal goals.   Expected Outcomes Achievement of increased cardiorespiratory fitness and enhanced flexibility, muscular endurance and strength shown through measurements of functional capacity and personal statement of participant.   Diabetes Yes   Intervention Provide education about signs/symptoms and action to take for hypo/hyperglycemia.;Provide education about proper nutrition, including hydration, and aerobic/resistive exercise prescription along with prescribed medications to achieve blood glucose in normal ranges: Fasting glucose 65-99 mg/dL   Expected Outcomes Short Term: Participant verbalizes understanding of the signs/symptoms and immediate care of hyper/hypoglycemia, proper foot care and importance of medication, aerobic/resistive exercise and nutrition plan for blood glucose control.;Long Term: Attainment of HbA1C < 7%.   Hypertension Yes   Intervention Provide education on lifestyle modifcations including regular physical activity/exercise, weight management, moderate sodium restriction and increased consumption of fresh fruit, vegetables, and low fat dairy, alcohol moderation, and smoking cessation.;Monitor prescription use compliance.   Expected Outcomes Short Term: Continued assessment and intervention until BP is < 140/47m HG in hypertensive participants. < 130/891mHG in hypertensive participants with diabetes, heart failure or chronic kidney disease.;Long Term: Maintenance of blood pressure at goal levels.   Lipids Yes   Intervention Provide education and support for participant on nutrition & aerobic/resistive exercise along with prescribed medications to achieve LDL <7031mHDL >43m58m Expected Outcomes Short Term: Participant states understanding of desired cholesterol values and is compliant with medications prescribed. Participant is  following exercise prescription and nutrition guidelines.   Personal Goal Other Yes   Personal Goal Get better and improve heart health   Intervention Provide exercise programming to assist with improving cardiovascular fitness and functional capacity.   Expected Outcomes Pt will be able to increase exercise tolerance and improve heart health       Personal Goals Discharge:     Goals and Risk Factor Review    Row Name 03/13/16 1622             Core Components/Risk Factors/Patient Goals Review   Review Pt states he can tell a differnece in his energy level and he's noticed he's had more stamina throughout the day.        Expected Outcomes Continue with exercise routine and gradual workload increases in order to continuously improve cardiorespiratory fitness           Nutrition & Weight - Outcomes:     Pre Biometrics - 02/01/16 1636      Pre Biometrics   Waist Circumference 34 inches   Hip Circumference 37.5 inches   Waist to Hip Ratio 0.91 %   Triceps Skinfold 17 mm   % Body Fat 24.1 %   Grip Strength 41 kg   Flexibility 7.5 in   Single Leg Stand 30 seconds         Post Biometrics - 05/24/16 1637       Post  Biometrics   Height 5' 10.5" (1.791 m)   Weight 190 lb 11.2 oz (86.5 kg)   Waist Circumference 36 inches   Hip Circumference 41 inches   Waist to Hip Ratio 0.88 %   BMI (Calculated) 27   Triceps Skinfold 22 mm   % Body Fat 26.7 %   Grip Strength 44.5 kg   Flexibility 7.5 in   Single Leg Stand 23 seconds      Nutrition:     Nutrition Therapy & Goals -  03/15/16 1617      Nutrition Therapy   Diet Carb Modified, Therapeutic Lifestyle Changes     Personal Nutrition Goals   Nutrition Goal Maintain wt while in Cardiac Rehab     Intervention Plan   Intervention Prescribe, educate and counsel regarding individualized specific dietary modifications aiming towards targeted core components such as weight, hypertension, lipid management, diabetes, heart  failure and other comorbidities.   Expected Outcomes Short Term Goal: Understand basic principles of dietary content, such as calories, fat, sodium, cholesterol and nutrients.;Long Term Goal: Adherence to prescribed nutrition plan.      Nutrition Discharge:     Nutrition Assessments - 06/14/16 0914      MEDFICTS Scores   Pre Score 32   Post Score 9   Score Difference -23      Education Questionnaire Score:     Knowledge Questionnaire Score - 05/19/16 1424      Knowledge Questionnaire Score   Post Score 20/24      Goals reviewed with patient; copy given to patient. Bea[place]ont graduated from cardiac rehab program today with completion of 36 exercise sessions in Phase II. Pt maintained good attendance and progressed nicely during his participation in rehab as evidenced by increased MET level.   Medication list reconciled. Repeat  PHQ score- 0 .  Pt has made significant lifestyle changes and should be commended for his success. Pt feels he has achieved his goals during cardiac rehab.   Pt plans to continue exercise by walking and using his treadmill at home. Ferris enjoyed participating in the program. We are proud of Manford's progress! [name], RN,BSN 06/14/2016 12:10 PM

## 2016-05-22 ENCOUNTER — Encounter (HOSPITAL_COMMUNITY): Payer: Medicare Other

## 2016-05-24 ENCOUNTER — Encounter (HOSPITAL_COMMUNITY): Payer: Medicare Other

## 2016-05-25 ENCOUNTER — Other Ambulatory Visit: Payer: Self-pay | Admitting: Adult Health

## 2016-05-26 ENCOUNTER — Encounter (HOSPITAL_COMMUNITY): Payer: Medicare Other

## 2016-05-29 ENCOUNTER — Encounter (HOSPITAL_COMMUNITY): Payer: Medicare Other

## 2016-05-31 ENCOUNTER — Encounter (HOSPITAL_COMMUNITY): Payer: Medicare Other

## 2016-05-31 ENCOUNTER — Other Ambulatory Visit: Payer: Self-pay

## 2016-05-31 DIAGNOSIS — E1140 Type 2 diabetes mellitus with diabetic neuropathy, unspecified: Secondary | ICD-10-CM

## 2016-05-31 DIAGNOSIS — Z760 Encounter for issue of repeat prescription: Secondary | ICD-10-CM

## 2016-05-31 DIAGNOSIS — IMO0002 Reserved for concepts with insufficient information to code with codable children: Secondary | ICD-10-CM

## 2016-05-31 DIAGNOSIS — E1165 Type 2 diabetes mellitus with hyperglycemia: Principal | ICD-10-CM

## 2016-05-31 DIAGNOSIS — Z794 Long term (current) use of insulin: Principal | ICD-10-CM

## 2016-05-31 MED ORDER — GLUCOSE BLOOD VI STRP: ORAL_STRIP | 12 refills | Status: DC

## 2016-06-02 ENCOUNTER — Encounter (HOSPITAL_COMMUNITY): Payer: Medicare Other

## 2016-06-05 ENCOUNTER — Encounter (HOSPITAL_COMMUNITY): Payer: Medicare Other

## 2016-06-09 LAB — HM DIABETES EYE EXAM

## 2016-06-13 ENCOUNTER — Encounter: Payer: Self-pay | Admitting: Family Medicine

## 2016-06-14 NOTE — Addendum Note (Signed)
Encounter addended by: Meta Hatchet on: 06/14/2016  2:57 PM<BR>    Actions taken: Visit Navigator Flowsheet section accepted

## 2016-06-14 NOTE — Addendum Note (Signed)
Encounter addended by: Jewel Baize, RD on: 06/14/2016  9:18 AM<BR>    Actions taken: Flowsheet data copied forward, Visit Navigator Flowsheet section accepted

## 2016-06-22 ENCOUNTER — Encounter: Payer: Medicare Other | Admitting: Cardiothoracic Surgery

## 2016-06-30 ENCOUNTER — Ambulatory Visit: Payer: Medicare Other | Admitting: Adult Health

## 2016-07-07 ENCOUNTER — Ambulatory Visit (INDEPENDENT_AMBULATORY_CARE_PROVIDER_SITE_OTHER): Payer: Medicare Other | Admitting: Adult Health

## 2016-07-07 ENCOUNTER — Encounter: Payer: Self-pay | Admitting: Adult Health

## 2016-07-07 VITALS — BP 110/64 | Temp 98.1°F | Ht 70.5 in | Wt 193.6 lb

## 2016-07-07 DIAGNOSIS — Z794 Long term (current) use of insulin: Secondary | ICD-10-CM

## 2016-07-07 DIAGNOSIS — E1140 Type 2 diabetes mellitus with diabetic neuropathy, unspecified: Secondary | ICD-10-CM | POA: Diagnosis not present

## 2016-07-07 DIAGNOSIS — E1165 Type 2 diabetes mellitus with hyperglycemia: Secondary | ICD-10-CM | POA: Diagnosis not present

## 2016-07-07 DIAGNOSIS — IMO0002 Reserved for concepts with insufficient information to code with codable children: Secondary | ICD-10-CM

## 2016-07-07 LAB — POCT GLYCOSYLATED HEMOGLOBIN (HGB A1C): HEMOGLOBIN A1C: 7.3

## 2016-07-07 MED ORDER — BASAGLAR KWIKPEN 100 UNIT/ML ~~LOC~~ SOPN: 23.0000 [IU] | PEN_INJECTOR | Freq: Every day | SUBCUTANEOUS | 11 refills | Status: DC

## 2016-07-07 NOTE — Patient Instructions (Addendum)
I am going to switch you from lantus to Jackson - same medication just a different name. Please increase the insulin to 23 units   Follow up with me in three months

## 2016-07-07 NOTE — Progress Notes (Signed)
Subjective:    Patient ID: [name], male    DOB: 1945/05/21, 71 y.o.   MRN: [id number]  HPI  71 year old male who  has a past medical history of Aortic stenosis; Carpal tunnel syndrome (09/24/2014); Coronary artery disease; Diabetes mellitus, type 2 ([place]); Erectile dysfunction; Essential hypertension; and Hyperlipidemia. He presents to the office today for three month follow up regarding diabetes. During his last visit his A1c had increased from 6.9 to 7.4. I had him increase his lantus from 17 units to 19 units.   Today in the office he reports that his blood sugars have been " ok" . He has not had any readings above 200 on his meter.   Wt Readings from Last 3 Encounters:  07/07/16 193 lb 9.6 oz (87.8 kg)  05/24/16 190 lb 11.2 oz (86.5 kg)  04/24/16 185 lb 6.4 oz (84.1 kg)     Review of Systems See HPI   Past Medical History:  Diagnosis Date  . 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 Carpal tunnel syndrome 09/24/2014   Bilateral  . 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 Diabetes mellitus, type 2 ([place])   . Erectile dysfunction   . Essential hypertension   . Hyperlipidemia     Social History   Social History  . Marital status: Married    Spouse name: N/A  . Number of children: N/A  . Years of education: N/A   Occupational History  . Not on file.   Social History Main Topics  . Smoking status: Never Smoker  . Smokeless tobacco: Never Used  . Alcohol use No  . Drug use: No  . Sexual activity: Yes   Other Topics Concern  . Not on file   Social History Narrative   Married to [name] for 46 years   Retired as Chiropodist   5 children   Never Smoked   Alcohol use- no   Caffeine use: occasional     Past Surgical History:  Procedure Laterality Date  . AORTIC VALVE REPLACEMENT N/A 10/29/2015   Procedure: AORTIC VALVE REPLACEMENT (AVR) WITH 23MM MAGNA EASE TISSUE VALVE.;  Surgeon:  [name], MD;  Location: [facility];  Service: Open Heart Surgery;  Laterality: N/A;  . CARDIAC CATHETERIZATION N/A 10/27/2015   Procedure: Left Heart Cath and Coronary Angiography;  Surgeon: [name], MD;  Location: [facility];  Service: Cardiovascular;  Laterality: N/A;  . CORONARY ARTERY BYPASS GRAFT N/A 10/29/2015   Procedure: CORONARY ARTERY BYPASS GRAFTING (CABG) x Four UTILIZING THE LEFT INTERNAL MAMMARY ARTERY AND ENDOSCOPICALLY HARVESTED RIGHT SAPEHENEOUS VEINS.;  Surgeon: [name], MD;  Location: [facility];  Service: Open Heart Surgery;  Laterality: N/A;  . CYST REMOVAL HAND Right   . ESOPHAGOGASTRODUODENOSCOPY N/A 12/02/2015   Procedure: ESOPHAGOGASTRODUODENOSCOPY (EGD);  Surgeon: [name], MD;  Location: [facility];  Service: Gastroenterology;  Laterality: N/A;  . REPLACEMENT ASCENDING AORTA N/A 10/29/2015   Procedure: REPLACEMENT OF ASCENDING AORTA USING 34MM X 30CM WOVEN DOUBLE VELOUR VASCULAR GRAFT.;  Surgeon: [name], MD;  Location: [facility];  Service: Open Heart Surgery;  Laterality: N/A;  . TEE WITHOUT CARDIOVERSION N/A 10/29/2015   Procedure: TRANSESOPHAGEAL ECHOCARDIOGRAM (TEE);  Surgeon: [name], MD;  Location: [facility];  Service: Open Heart Surgery;  Laterality: N/A;  . UVULECTOMY N/A 12/10/2015   Procedure: UVULECTOMY;  Surgeon: [name],  MD;  Location: [facility];  Service: ENT;  Laterality: N/A;    Family History  Problem Relation Age of Onset  . Cancer Father        Throat cancer  . Diabetes Sister     Allergies  Allergen Reactions  . Amiodarone Nausea And Vomiting    Current Outpatient Prescriptions on File Prior to Visit  Medication Sig Dispense Refill  . aspirin EC 81 MG tablet Take 81 mg by mouth daily.    [name] Kitchen atorvastatin (LIPITOR) 40 MG tablet Take 1 tablet (40 mg total) by mouth daily. 30 tablet 11  . B-D ULTRAFINE III SHORT PEN 31G X 8 MM MISC USE 4 TIMES DAILY AS DIRECTED 100 each 0  . ferrous  gluconate (FERGON) 324 MG tablet Take 1 tablet (324 mg total) by mouth daily with breakfast. 90 tablet 3  . folic acid (FOLVITE) 1 MG tablet Take 1 tablet (1 mg total) by mouth daily. 30 tablet 1  . glucose blood test strip Test blood sugars daily. Dx: E11.9 100 each 12  . Insulin Glargine (LANTUS SOLOSTAR) 100 UNIT/ML Solostar Pen Inject 15 Units into the skin daily at 10 pm. (Patient taking differently: Inject 19 Units into the skin daily at 10 pm. ) 5 pen 5  . metFORMIN (GLUCOPHAGE) 1000 MG tablet Take 1 tablet (1,000 mg total) by mouth 2 (two) times daily with a meal. 180 tablet 1  . metoprolol tartrate (LOPRESSOR) 25 MG tablet TAKE 3 TABLETS BY MOUTH TWICE A DAY 180 tablet 1  . ondansetron (ZOFRAN ODT) 4 MG disintegrating tablet Take 1 tablet (4 mg total) by mouth every 8 (eight) hours as needed for nausea or vomiting. 30 tablet 0   No current facility-administered medications on file prior to visit.     BP 110/64 (BP Location: Left Arm, Patient Position: Sitting, Cuff Size: Normal)   Temp 98.1 F (36.7 C) (Oral)   Ht 5' 10.5" (1.791 m)   Wt 193 lb 9.6 oz (87.8 kg)   BMI 27.39 kg/m       Objective:   Physical Exam  Constitutional: He is oriented to person, place, and time. He appears well-developed and well-nourished. No distress.  Cardiovascular: Normal rate, regular rhythm, normal heart sounds and intact distal pulses.  Exam reveals no gallop and no friction rub.   No murmur heard. Pulmonary/Chest: Effort normal and breath sounds normal. No respiratory distress. He has no wheezes. He has no rales. He exhibits no tenderness.  Neurological: He is alert and oriented to person, place, and time.  Skin: Skin is warm and dry. No rash noted. He is not diaphoretic. No erythema. No pallor.  Psychiatric: He has a normal mood and affect. His behavior is normal. Judgment and thought content normal.  Nursing note and vitals reviewed.     Assessment & Plan:  1. Uncontrolled type 2 diabetes  mellitus with diabetic neuropathy, with long-term current use of insulin (HCC)  - POC HgB A1c - 7.3. Improved from 7.4. Will switch him from Lantus to [name] due to insurance reasons. Increase from 19 to 23 units.  - Educated on the importance of a diabetic diet and frequent exercise  - Insulin Glargine ([name] KWIKPEN) 100 UNIT/ML SOPN; Inject 0.23 mLs (23 Units total) into the skin at bedtime.  Dispense: 3 mL; Refill: 11   [name], NP

## 2016-07-11 ENCOUNTER — Telehealth: Payer: Self-pay

## 2016-07-11 NOTE — Telephone Encounter (Signed)
Received PA request for [name]. PA submitted & pending. Key: [id number]

## 2016-07-12 NOTE — Telephone Encounter (Signed)
See below

## 2016-07-12 NOTE — Telephone Encounter (Signed)
I left message for patient to return phone call.   

## 2016-07-12 NOTE — Telephone Encounter (Signed)
Insurance needs to know why patient is unable to use Lantus or Toujeo.

## 2016-07-12 NOTE — Telephone Encounter (Signed)
If lantus is cheaper than that is fine if he stays on Lantus

## 2016-07-18 NOTE — Telephone Encounter (Signed)
I left detailed message for patient regarding this information, and advised that he contact us to let us know about Lantus, and if he is ok with this medication.

## 2016-07-19 ENCOUNTER — Other Ambulatory Visit: Payer: Self-pay | Admitting: Adult Health

## 2016-10-03 ENCOUNTER — Telehealth: Payer: Self-pay

## 2016-10-03 NOTE — Telephone Encounter (Signed)
Received PA request for [name]. PA submitted & pending. Key: [id number]

## 2016-10-03 NOTE — Telephone Encounter (Signed)
PA approved, form faxed back to pharmacy. 

## 2016-10-06 ENCOUNTER — Ambulatory Visit (INDEPENDENT_AMBULATORY_CARE_PROVIDER_SITE_OTHER): Payer: Medicare Other | Admitting: Adult Health

## 2016-10-06 VITALS — BP 130/70 | Temp 98.0°F | Ht 70.5 in | Wt 196.0 lb

## 2016-10-06 DIAGNOSIS — E1165 Type 2 diabetes mellitus with hyperglycemia: Secondary | ICD-10-CM | POA: Diagnosis not present

## 2016-10-06 DIAGNOSIS — IMO0002 Reserved for concepts with insufficient information to code with codable children: Secondary | ICD-10-CM

## 2016-10-06 DIAGNOSIS — Z794 Long term (current) use of insulin: Secondary | ICD-10-CM

## 2016-10-06 DIAGNOSIS — E1140 Type 2 diabetes mellitus with diabetic neuropathy, unspecified: Secondary | ICD-10-CM

## 2016-10-06 LAB — POCT GLYCOSYLATED HEMOGLOBIN (HGB A1C): HEMOGLOBIN A1C: 9.3

## 2016-10-06 MED ORDER — GLIPIZIDE ER 5 MG PO TB24: 5.0000 mg | ORAL_TABLET | Freq: Every day | ORAL | 3 refills | Status: DC

## 2016-10-06 NOTE — Progress Notes (Signed)
Subjective:    Patient ID: [name], male    DOB: [birth year], 71 y.o.   MRN: [id number]  HPI  71 year old male who  has a past medical history of Aortic stenosis; Carpal tunnel syndrome (09/24/2014); Coronary artery disease; Diabetes mellitus, type 2 ([place]); Erectile dysfunction; Essential hypertension; and Hyperlipidemia.   He presents to the office today for three month follow up regarding diabetes. During his last visit his A1c was 7.3, this was down from 7.4 the month prior. I increased his Lantus from 19 to 23 units   Lab Results  Component Value Date   HGBA1C 7.3 07/07/2016   Today in the office he reports that his blood sugars have been in the 140's. He denies any episodes of hypoglycemia. He is not eating sweets but his carb intake has increased.    Review of Systems See HPI   Past Medical History:  Diagnosis Date  . 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 Carpal tunnel syndrome 09/24/2014   Bilateral  . 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 Diabetes mellitus, type 2 ([place])   . Erectile dysfunction   . Essential hypertension   . Hyperlipidemia     Social History   Social History  . Marital status: Married    Spouse name: N/A  . Number of children: N/A  . Years of education: N/A   Occupational History  . Not on file.   Social History Main Topics  . Smoking status: Never Smoker  . Smokeless tobacco: Never Used  . Alcohol use No  . Drug use: No  . Sexual activity: Yes   Other Topics Concern  . Not on file   Social History Narrative   Married to [name] for 57 years   Retired as Chiropodist   5 children   Never Smoked   Alcohol use- no   Caffeine use: occasional     Past Surgical History:  Procedure Laterality Date  . AORTIC VALVE REPLACEMENT N/A 10/29/2015   Procedure: AORTIC VALVE REPLACEMENT (AVR) WITH 23MM MAGNA EASE TISSUE VALVE.;  Surgeon: [name], MD;   Location: [facility];  Service: Open Heart Surgery;  Laterality: N/A;  . CARDIAC CATHETERIZATION N/A 10/27/2015   Procedure: Left Heart Cath and Coronary Angiography;  Surgeon: [name], MD;  Location: [facility];  Service: Cardiovascular;  Laterality: N/A;  . CORONARY ARTERY BYPASS GRAFT N/A 10/29/2015   Procedure: CORONARY ARTERY BYPASS GRAFTING (CABG) x Four UTILIZING THE LEFT INTERNAL MAMMARY ARTERY AND ENDOSCOPICALLY HARVESTED RIGHT SAPEHENEOUS VEINS.;  Surgeon: [name], MD;  Location: [facility];  Service: Open Heart Surgery;  Laterality: N/A;  . CYST REMOVAL HAND Right   . ESOPHAGOGASTRODUODENOSCOPY N/A 12/02/2015   Procedure: ESOPHAGOGASTRODUODENOSCOPY (EGD);  Surgeon: [name], MD;  Location: [facility];  Service: Gastroenterology;  Laterality: N/A;  . REPLACEMENT ASCENDING AORTA N/A 10/29/2015   Procedure: REPLACEMENT OF ASCENDING AORTA USING 34MM X 30CM WOVEN DOUBLE VELOUR VASCULAR GRAFT.;  Surgeon: [name], MD;  Location: [facility];  Service: Open Heart Surgery;  Laterality: N/A;  . TEE WITHOUT CARDIOVERSION N/A 10/29/2015   Procedure: TRANSESOPHAGEAL ECHOCARDIOGRAM (TEE);  Surgeon: [name], MD;  Location: [facility];  Service: Open Heart Surgery;  Laterality: N/A;  . UVULECTOMY N/A 12/10/2015   Procedure: UVULECTOMY;  Surgeon: [name], MD;  Location: [facility];  Service: ENT;  Laterality: N/A;  Family History  Problem Relation Age of Onset  . Cancer Father        Throat cancer  . Diabetes Sister     Allergies  Allergen Reactions  . Amiodarone Nausea And Vomiting    Current Outpatient Prescriptions on File Prior to Visit  Medication Sig Dispense Refill  . aspirin EC 81 MG tablet Take 81 mg by mouth daily.    [name] Kitchen atorvastatin (LIPITOR) 40 MG tablet Take 1 tablet (40 mg total) by mouth daily. 30 tablet 11  . B-D ULTRAFINE III SHORT PEN 31G X 8 MM MISC USE 4 TIMES DAILY AS DIRECTED 100 each 0  . glucose blood test strip Test blood  sugars daily. Dx: E11.9 100 each 12  . Insulin Glargine ([name] KWIKPEN) 100 UNIT/ML SOPN Inject 0.23 mLs (23 Units total) into the skin at bedtime. 3 mL 11  . metFORMIN (GLUCOPHAGE) 1000 MG tablet TAKE 1 TABLET (1,000 MG TOTAL) BY MOUTH 2 (TWO) TIMES DAILY WITH A MEAL. 180 tablet 1  . metoprolol tartrate (LOPRESSOR) 25 MG tablet TAKE 3 TABLETS BY MOUTH TWICE A DAY 180 tablet 1  . ferrous gluconate (FERGON) 324 MG tablet Take 1 tablet (324 mg total) by mouth daily with breakfast. (Patient not taking: Reported on 10/06/2016) 90 tablet 3   No current facility-administered medications on file prior to visit.     BP 130/70 (BP Location: Left Arm)   Temp 98 F (36.7 C) (Oral)   Ht 5' 10.5" (1.791 m)   Wt 196 lb (88.9 kg)   BMI 27.73 kg/m       Objective:   Physical Exam  Constitutional: He is oriented to person, place, and time. He appears well-developed and well-nourished. No distress.  Cardiovascular: Normal rate, regular rhythm, normal heart sounds and intact distal pulses.  Exam reveals no gallop and no friction rub.   No murmur heard. Pulmonary/Chest: Effort normal and breath sounds normal. No respiratory distress. He has no wheezes. He has no rales. He exhibits no tenderness.  Neurological: He is alert and oriented to person, place, and time.  Skin: Skin is warm and dry. No rash noted. He is not diaphoretic. No erythema. No pallor.  Psychiatric: He has a normal mood and affect. His behavior is normal. Judgment and thought content normal.  Nursing note and vitals reviewed.     Assessment & Plan:  1. Uncontrolled type 2 diabetes mellitus with diabetic neuropathy, with long-term current use of insulin (HCC) - POCT A1C - 9.3, has increased. Will prescribe Glipizide and he is to restrict his carbs  - glipiZIDE (GLUCOTROL XL) 5 MG 24 hr tablet; Take 1 tablet (5 mg total) by mouth daily with breakfast.  Dispense: 30 tablet; Refill: 3 - Follow up in 3 months   [name], NP

## 2016-10-06 NOTE — Patient Instructions (Signed)
It was great seeing you today   Your A1c is 9.3 today   I am going to prescribe a medication called Glipizide 5 mg, take this once a day at night   Follow up in 3 months. No carbs!

## 2016-10-20 ENCOUNTER — Other Ambulatory Visit: Payer: Self-pay | Admitting: Cardiovascular Disease

## 2016-11-03 ENCOUNTER — Encounter: Payer: Self-pay | Admitting: Adult Health

## 2016-11-14 ENCOUNTER — Ambulatory Visit (INDEPENDENT_AMBULATORY_CARE_PROVIDER_SITE_OTHER): Payer: Medicare Other | Admitting: Cardiovascular Disease

## 2016-11-14 ENCOUNTER — Encounter: Payer: Self-pay | Admitting: Cardiovascular Disease

## 2016-11-14 VITALS — BP 144/82 | HR 65 | Ht 72.0 in | Wt 196.8 lb

## 2016-11-14 DIAGNOSIS — I25810 Atherosclerosis of coronary artery bypass graft(s) without angina pectoris: Secondary | ICD-10-CM

## 2016-11-14 DIAGNOSIS — I10 Essential (primary) hypertension: Secondary | ICD-10-CM | POA: Diagnosis not present

## 2016-11-14 DIAGNOSIS — E785 Hyperlipidemia, unspecified: Secondary | ICD-10-CM

## 2016-11-14 DIAGNOSIS — Z952 Presence of prosthetic heart valve: Secondary | ICD-10-CM

## 2016-11-14 DIAGNOSIS — I350 Nonrheumatic aortic (valve) stenosis: Secondary | ICD-10-CM

## 2016-11-14 LAB — COMPREHENSIVE METABOLIC PANEL
ALBUMIN: 4.6 g/dL (ref 3.6–4.8)
ALT: 22 IU/L (ref 0–44)
AST: 28 IU/L (ref 0–40)
Albumin/Globulin Ratio: 1.7 (ref 1.2–2.2)
Alkaline Phosphatase: 77 IU/L (ref 39–117)
BUN / CREAT RATIO: 13 (ref 10–24)
BUN: 14 mg/dL (ref 8–27)
Bilirubin Total: 0.5 mg/dL (ref 0.0–1.2)
CALCIUM: 9.5 mg/dL (ref 8.6–10.2)
CO2: 25 mmol/L (ref 20–29)
CREATININE: 1.05 mg/dL (ref 0.76–1.27)
Chloride: 104 mmol/L (ref 96–106)
GFR calc Af Amer: 83 mL/min/{1.73_m2} (ref >59)
GFR, EST NON AFRICAN AMERICAN: 72 mL/min/{1.73_m2} (ref >59)
GLOBULIN, TOTAL: 2.7 g/dL (ref 1.5–4.5)
Glucose: 161 mg/dL — ABNORMAL HIGH (ref 65–99)
Potassium: 4.5 mmol/L (ref 3.5–5.2)
SODIUM: 141 mmol/L (ref 134–144)
Total Protein: 7.3 g/dL (ref 6.0–8.5)

## 2016-11-14 LAB — LIPID PANEL
Chol/HDL Ratio: 2 ratio (ref 0.0–5.0)
Cholesterol, Total: 188 mg/dL (ref 100–199)
HDL: 92 mg/dL (ref >39)
LDL CALC: 85 mg/dL (ref 0–99)
TRIGLYCERIDES: 56 mg/dL (ref 0–149)
VLDL CHOLESTEROL CAL: 11 mg/dL (ref 5–40)

## 2016-11-14 MED ORDER — HYDROCHLOROTHIAZIDE 25 MG PO TABS: 25.0000 mg | ORAL_TABLET | Freq: Every day | ORAL | 1 refills | Status: DC

## 2016-11-14 MED ORDER — POTASSIUM CHLORIDE ER 10 MEQ PO TBCR: 10.0000 meq | EXTENDED_RELEASE_TABLET | Freq: Every day | ORAL | 1 refills | Status: DC

## 2016-11-14 NOTE — Progress Notes (Signed)
[name] Date of Birth  Sep 02, 1945     [address]    [address]    [place], [place]  [address]        Problems: 1. Chest pain 2. CAD - s/p CABG  - Sept. 2017  3. Aortic stenosis - s/p AVR Sept. 2017.  4. Hypercholesterolemia 5. Diabetes mellitus 6. Mild dilatation of the ascending aorta by CT scan-4.4 cm    Previous notes.:    [name] is a 71 year old gentleman who presents today for further evaluation of his chest pain. These chest pains are described as a left-sided pain. He seemed to come on with rest and exertion.  The pains last for hours.    He has been quite regularly.  He does not do any regular exercise.  He works as a Sports coach.  He had a stress Myoview study which was normal. His echocardiogram revealed  Left ventricle: The cavity size was normal. Wall thickness was increased in a pattern of mild LVH. Systolic function was normal. The estimated ejection fraction was in the range of 55% to 60%. 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 Mean gradient: 36m Hg (S). Peak gradient: 347mHg (S). - Atrial septum: No defect or patent foramen ovale was identified.  He still has some occasional episodes of chest pain that he describes as a "pins and needles" like sensation.  He's able to get out and exercise without any problems.  Dec. 11, 2015:  [name] a 89o who I follow for episodes of chest pain, mild aortic stenosis, mild aortic dilitation He is seen back after a 2 year absence.   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   No CP or dyspnea.   Walks occasionally ,  A recent echo showed mild AS and trace MR.  He has mild aortic dilitation.  July 23 2015:  [name] seen back after a 2 year absence . He's continued to have some episodes of chest pain. We performed a stress Myoview study on him in 2013 which was normal. These episodes  of pain are not associated with eating, drinking, exercise, or change of position. The pains are exacerbated by leaning forward. He's not tried any specific medications. 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  He notes some shortness of breath with exertion when he is climbing stairs. This seems to be stable.  Dec. 7, 2017:  [name] seen back  Had CABG ( 4 vessel)  + AVR (23MM MAGNA EASE TISSUE VALVE. (N/A) on Sept.  left internal mammary to the left anterior descending coronary artery, sequential reverse SVG  to the first and second obtuse marginal, SVG to the distal right coronary artery  He has had multiple hospitalizations (5)  since that time for nausea and vomiting. Eventually his amiodarone was discontinued.  This seems to have helped   Hospitalization was complicated by trauma to the [place] - required surgical removal .   He was told by someone to stop his Xarelto and start an aspirin.  Took Xarelto for a month.   April 24, 2016:  [name] seen today .  No CP or dyspnea.  Healing up from his CABG in Sept. 2017.  Oct. 2, 2018:    [name] is seen for follow-up of his coronary artery disease, hyperlipidemia, hypertension and aortic valve placement. He still has dilatation of his ascending aorta.  has some chest soreness at times , when he picks up something a bit heavy .  Not angina ,   A soreness.  Getting some regular exercise  BP is slightly elevated  Still eating some salty foods , canned green beans  No syncope or presyncope. He denies any chest pain or shortness of breath.    Current Outpatient Prescriptions on File Prior to Visit  Medication Sig Dispense Refill  . aspirin EC 81 MG tablet Take 81 mg by mouth daily.    [name] Kitchen atorvastatin (LIPITOR) 40 MG tablet TAKE 1 TABLET (40 MG TOTAL) BY MOUTH DAILY. 30 tablet 11  . B-D ULTRAFINE III SHORT PEN 31G X 8 MM MISC USE 4  TIMES DAILY AS DIRECTED 100 each 0  . ferrous gluconate (FERGON) 324 MG tablet Take 1 tablet (324 mg total) by mouth daily with breakfast. 90 tablet 3  . glipiZIDE (GLUCOTROL XL) 5 MG 24 hr tablet Take 1 tablet (5 mg total) by mouth daily with breakfast. 30 tablet 3  . glucose blood test strip Test blood sugars daily. Dx: E11.9 100 each 12  . Insulin Glargine ([name] KWIKPEN) 100 UNIT/ML SOPN Inject 0.23 mLs (23 Units total) into the skin at bedtime. 3 mL 11  . metFORMIN (GLUCOPHAGE) 1000 MG tablet TAKE 1 TABLET (1,000 MG TOTAL) BY MOUTH 2 (TWO) TIMES DAILY WITH A MEAL. 180 tablet 1  . metoprolol tartrate (LOPRESSOR) 25 MG tablet TAKE 3 TABLETS BY MOUTH TWICE A DAY 180 tablet 1   No current facility-administered medications on file prior to visit.     Allergies  Allergen Reactions  . Amiodarone Nausea And Vomiting    Past Medical History:  Diagnosis Date  . 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 Carpal tunnel syndrome 09/24/2014   Bilateral  . 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 Diabetes mellitus, type 2 ([place])   . Erectile dysfunction   . Essential hypertension   . Hyperlipidemia     Past Surgical History:  Procedure Laterality Date  . AORTIC VALVE REPLACEMENT N/A 10/29/2015   Procedure: AORTIC VALVE REPLACEMENT (AVR) WITH 23MM MAGNA EASE TISSUE VALVE.;  Surgeon: [name], MD;  Location: [facility];  Service: Open Heart Surgery;  Laterality: N/A;  . CARDIAC CATHETERIZATION N/A 10/27/2015   Procedure: Left Heart Cath and Coronary Angiography;  Surgeon: [name], MD;  Location: [facility];  Service: Cardiovascular;  Laterality: N/A;  . CORONARY ARTERY BYPASS GRAFT N/A 10/29/2015   Procedure: CORONARY ARTERY BYPASS GRAFTING (CABG) x Four UTILIZING THE LEFT INTERNAL MAMMARY ARTERY AND ENDOSCOPICALLY HARVESTED RIGHT SAPEHENEOUS VEINS.;  Surgeon: [name], MD;  Location: [facility];  Service: Open  Heart Surgery;  Laterality: N/A;  . CYST REMOVAL HAND Right   . ESOPHAGOGASTRODUODENOSCOPY N/A 12/02/2015   Procedure: ESOPHAGOGASTRODUODENOSCOPY (EGD);  Surgeon: [name], MD;  Location: [facility];  Service: Gastroenterology;  Laterality: N/A;  . REPLACEMENT ASCENDING AORTA N/A 10/29/2015   Procedure: REPLACEMENT OF ASCENDING AORTA USING 34MM X 30CM WOVEN DOUBLE VELOUR VASCULAR GRAFT.;  Surgeon: [name], MD;  Location: [facility];  Service: Open Heart Surgery;  Laterality: N/A;  . TEE WITHOUT CARDIOVERSION N/A 10/29/2015   Procedure: TRANSESOPHAGEAL ECHOCARDIOGRAM (TEE);  Surgeon:  [name], MD;  Location: [facility];  Service: Open Heart Surgery;  Laterality: N/A;  . UVULECTOMY N/A 12/10/2015   Procedure: UVULECTOMY;  Surgeon: [name], MD;  Location[facility];  Service: ENT;  Laterality: N/A;    History  Smoking Status  . Never Smoker  Smokeless Tobacco  . Never Used    History  Alcohol Use No    Family History  Problem Relation Age of Onset  . Cancer Father        Throat cancer  . Diabetes Sister     Reviw of Systems:  Reviewed in the HPI.  All other systems are negative.  Physical Exam: Blood pressure (!) 144/82, pulse 65, height 6' (1.829 m), weight 196 lb 12.8 oz (89.3 kg).  GEN:  Well nourished, well developed in no acute distress HEENT: Normal NECK: No JVD; No carotid bruits LYMPHATICS: No lymphadenopathy CARDIAC: RR , normal S1, loud S2 , no murmurs, rubs, gallops RESPIRATORY:  Clear to auscultation without rales, wheezing or rhonchi  ABDOMEN: Soft, non-tender, non-distended MUSCULOSKELETAL:  No edema; No deformity  SKIN: Warm and dry NEUROLOGIC:  Alert and oriented x 3     ECG: Oct. 2, 2018:   NSR at 65.  Normal ECG .   Assessment / Plan:   1. Bicupsid AV:   Status post aortic valve replacement  2.  Ascending aortic aneurism:   Followed by Dr. Servando Snare. He'll be seeing Dr. Servando Snare soon.  3. CAD - . He status post coronary  artery bypass grafting. He's not having any episodes of angina  4. Paroxysmal atrial fib:  He has maintained normal sinus rhythm. He is off amiodarone  5. Hyperlipidemia:  Continue atorvastatin. His LDL goal is 70  6. HTN:  He still eats some salty foods on occasion. We will add hydrochlorothiazide 25 g a day and potassium chloride 10 mEq a day. We'll check a basic medical profile in 3 weeks.   Mertie Moores, MD  11/14/2016 9:06 AM    [place] [place] [address],  [place], [place]  [address] Pager [phone] Phone: [phone]; Fax: [phone]

## 2016-11-14 NOTE — Patient Instructions (Addendum)
Medication Instructions:   START TAKING HYDROCHLOROTHIAZIDE 25 MG ONCE DAILY  START TAKING POTASSIUM CHLORIDE 10 mEq ONCE DAILY     Labwork:  TODAY--CMET AND LIPIDS  IN 3 WEEKS (ON 12/05/16) AT OUR OFFICE TO CHECK---BMET     Follow-Up:  Your physician wants you to follow-up in: [place] DR. Acie Fredrickson You will receive a reminder letter in the mail two months in advance. If you don't receive a letter, please call our office to schedule the follow-up appointment.        If you need a refill on your cardiac medications before your next appointment, please call your pharmacy.

## 2016-12-01 ENCOUNTER — Telehealth: Payer: Self-pay | Admitting: Nurse Practitioner

## 2016-12-01 DIAGNOSIS — E782 Mixed hyperlipidemia: Secondary | ICD-10-CM

## 2016-12-01 MED ORDER — ATORVASTATIN CALCIUM 80 MG PO TABS: 80.0000 mg | ORAL_TABLET | Freq: Every day | ORAL | 3 refills | Status: DC

## 2016-12-01 NOTE — Telephone Encounter (Signed)
Reviewed lab results and plan of care to increase atorvastatin to 80 mg daily. He is scheduled to return for fasting lab work on 03/08/17. I advised him to call back with questions or concerns prior to that appointment. He thanked me for the call.

## 2016-12-01 NOTE — Telephone Encounter (Signed)
-----   Message from Thayer Headings, MD sent at 11/15/2016 10:11 AM EDT ----- Labs are stable LDL is 85 - goal is 70 Increase atrovastatin to 80 mg a day  Check labs in 3 months

## 2016-12-05 ENCOUNTER — Other Ambulatory Visit: Payer: Medicare Other

## 2016-12-05 DIAGNOSIS — I25810 Atherosclerosis of coronary artery bypass graft(s) without angina pectoris: Secondary | ICD-10-CM

## 2016-12-05 DIAGNOSIS — E785 Hyperlipidemia, unspecified: Secondary | ICD-10-CM

## 2016-12-05 DIAGNOSIS — I350 Nonrheumatic aortic (valve) stenosis: Secondary | ICD-10-CM

## 2016-12-05 DIAGNOSIS — Z952 Presence of prosthetic heart valve: Secondary | ICD-10-CM

## 2016-12-05 DIAGNOSIS — I10 Essential (primary) hypertension: Secondary | ICD-10-CM

## 2016-12-05 LAB — BASIC METABOLIC PANEL
BUN / CREAT RATIO: 15 (ref 10–24)
BUN: 17 mg/dL (ref 8–27)
CHLORIDE: 100 mmol/L (ref 96–106)
CO2: 25 mmol/L (ref 20–29)
Calcium: 9.5 mg/dL (ref 8.6–10.2)
Creatinine, Ser: 1.15 mg/dL (ref 0.76–1.27)
GFR calc Af Amer: 75 mL/min/{1.73_m2} (ref >59)
GFR calc non Af Amer: 65 mL/min/{1.73_m2} (ref >59)
GLUCOSE: 157 mg/dL — AB (ref 65–99)
Potassium: 4.5 mmol/L (ref 3.5–5.2)
SODIUM: 138 mmol/L (ref 134–144)

## 2016-12-12 ENCOUNTER — Other Ambulatory Visit: Payer: Self-pay | Admitting: Adult Health

## 2016-12-13 NOTE — Telephone Encounter (Signed)
Sent to the pharmacy by e-scribe. 

## 2017-01-09 ENCOUNTER — Encounter: Payer: Self-pay | Admitting: Adult Health

## 2017-01-09 ENCOUNTER — Ambulatory Visit: Payer: Medicare Other | Admitting: Adult Health

## 2017-01-09 VITALS — BP 140/64 | Temp 98.7°F | Wt 200.0 lb

## 2017-01-09 DIAGNOSIS — E1165 Type 2 diabetes mellitus with hyperglycemia: Secondary | ICD-10-CM

## 2017-01-09 DIAGNOSIS — Z794 Long term (current) use of insulin: Secondary | ICD-10-CM | POA: Diagnosis not present

## 2017-01-09 DIAGNOSIS — IMO0002 Reserved for concepts with insufficient information to code with codable children: Secondary | ICD-10-CM

## 2017-01-09 DIAGNOSIS — E1140 Type 2 diabetes mellitus with diabetic neuropathy, unspecified: Secondary | ICD-10-CM

## 2017-01-09 LAB — POCT GLYCOSYLATED HEMOGLOBIN (HGB A1C): HEMOGLOBIN A1C: 7.1

## 2017-01-09 MED ORDER — GLIPIZIDE ER 5 MG PO TB24: 5.0000 mg | ORAL_TABLET | Freq: Every day | ORAL | 3 refills | Status: DC

## 2017-01-09 NOTE — Progress Notes (Signed)
Subjective:    Patient ID: [name], male    DOB: 04-05-1945, 71 y.o.   MRN: [id number]  HPI 71 year old male who  has a past medical history of Aortic stenosis, Carpal tunnel syndrome (09/24/2014), Coronary artery disease, Diabetes mellitus, type 2 ([place]), Erectile dysfunction, Essential hypertension, and Hyperlipidemia. He presents to the office today for three month follow up regarding diabetes mellitus. During his last visit in August, his A1c had increased to 9.3 from 7.3. The decision was made to add glipizide XL 5 mg to his regimen of Metformin 1000 mg BID and [name] 23 units nightly.   Today in the office he reports that his blood sugars have been better controlled at home, most of his readings have been in the 120-140 range. He has had one episode of a feeling of hypoglycemia over the last three months.   He has cut out rice and pasta and has not been eating sweets.     Review of Systems See HPI   Past Medical History:  Diagnosis Date  . 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 Carpal tunnel syndrome 09/24/2014   Bilateral  . 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 Diabetes mellitus, type 2 ([place])   . Erectile dysfunction   . Essential hypertension   . Hyperlipidemia     Social History   Socioeconomic History  . Marital status: Married    Spouse name: Not on file  . Number of children: Not on file  . Years of education: Not on file  . Highest education level: Not on file  Social Needs  . Financial resource strain: Not on file  . Food insecurity - worry: Not on file  . Food insecurity - inability: Not on file  . Transportation needs - medical: Not on file  . Transportation needs - non-medical: Not on file  Occupational History  . Not on file  Tobacco Use  . Smoking status: Never Smoker  . Smokeless tobacco: Never Used  Substance and Sexual Activity  . Alcohol use: No  . Drug use: No  . Sexual  activity: Yes  Other Topics Concern  . 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 AORTIC VALVE REPLACEMENT N/A 10/29/2015   Procedure: AORTIC VALVE REPLACEMENT (AVR) WITH 23MM MAGNA EASE TISSUE VALVE.;  Surgeon: [name], MD;  Location: [facility];  Service: Open Heart Surgery;  Laterality: N/A;  . CARDIAC CATHETERIZATION N/A 10/27/2015   Procedure: Left Heart Cath and Coronary Angiography;  Surgeon: [name], MD;  Location: [facility];  Service: Cardiovascular;  Laterality: N/A;  . CORONARY ARTERY BYPASS GRAFT N/A 10/29/2015   Procedure: CORONARY ARTERY BYPASS GRAFTING (CABG) x Four UTILIZING THE LEFT INTERNAL MAMMARY ARTERY AND ENDOSCOPICALLY HARVESTED RIGHT SAPEHENEOUS VEINS.;  Surgeon: [name], MD;  Location: [facility];  Service: Open Heart Surgery;  Laterality: N/A;  . CYST REMOVAL HAND Right   . ESOPHAGOGASTRODUODENOSCOPY N/A 12/02/2015   Procedure: ESOPHAGOGASTRODUODENOSCOPY (EGD);  Surgeon: [name], MD;  Location: [facility];  Service: Gastroenterology;  Laterality: N/A;  . REPLACEMENT ASCENDING AORTA N/A 10/29/2015   Procedure: REPLACEMENT OF ASCENDING AORTA  USING 34MM X 30CM WOVEN DOUBLE VELOUR VASCULAR GRAFT.;  Surgeon: [name], MD;  Location: [facility];  Service: Open Heart Surgery;  Laterality: N/A;  . TEE WITHOUT CARDIOVERSION N/A 10/29/2015   Procedure: TRANSESOPHAGEAL ECHOCARDIOGRAM (TEE);  Surgeon: [name], MD;  Location: [facility];  Service: Open Heart Surgery;  Laterality: N/A;  . UVULECTOMY N/A 12/10/2015   Procedure: UVULECTOMY;  Surgeon: [name], MD;  Location: [facility];  Service: ENT;  Laterality: N/A;    Family History  Problem Relation Age of Onset  . Cancer Father        Throat cancer  . Diabetes Sister      Allergies  Allergen Reactions  . Amiodarone Nausea And Vomiting    Current Outpatient Medications on File Prior to Visit  Medication Sig Dispense Refill  . aspirin EC 81 MG tablet Take 81 mg by mouth daily.    [name] Kitchen atorvastatin (LIPITOR) 80 MG tablet Take 1 tablet (80 mg total) by mouth daily. 90 tablet 3  . B-D ULTRAFINE III SHORT PEN 31G X 8 MM MISC USE 4 TIMES DAILY AS DIRECTED 100 each 0  . glucose blood test strip Test blood sugars daily. Dx: E11.9 100 each 12  . hydrochlorothiazide (HYDRODIURIL) 25 MG tablet Take 1 tablet (25 mg total) by mouth daily. 90 tablet 1  . Insulin Glargine ([name] KWIKPEN) 100 UNIT/ML SOPN Inject 0.23 mLs (23 Units total) into the skin at bedtime. 3 mL 11  . metFORMIN (GLUCOPHAGE) 1000 MG tablet TAKE 1 TABLET (1,000 MG TOTAL) BY MOUTH 2 (TWO) TIMES DAILY WITH A MEAL. 180 tablet 1  . metoprolol tartrate (LOPRESSOR) 25 MG tablet TAKE 3 TABLETS BY MOUTH TWICE A DAY 180 tablet 0  . potassium chloride (K-DUR) 10 MEQ tablet Take 1 tablet (10 mEq total) by mouth daily. 90 tablet 1   No current facility-administered medications on file prior to visit.     BP 140/64 (BP Location: Left Arm)   Temp 98.7 F (37.1 C) (Oral)   Wt 200 lb (90.7 kg)   BMI 27.12 kg/m       Objective:   Physical Exam  Constitutional: He is oriented to person, place, and time. He appears well-developed and well-nourished. No distress.  Cardiovascular: Normal rate, regular rhythm, normal heart sounds and intact distal pulses. Exam reveals no gallop and no friction rub.  No murmur heard. Pulmonary/Chest: Effort normal and breath sounds normal. No respiratory distress. He has no wheezes. He has no rales. He exhibits no tenderness.  Musculoskeletal: Normal range of motion. He exhibits no edema, tenderness or deformity.  Neurological: He is alert and oriented to person, place, and time.  Skin: Skin is warm and dry. No rash noted. He is not diaphoretic. No erythema. No pallor.   Psychiatric: He has a normal mood and affect. His behavior is normal. Judgment and thought content normal.  Nursing note and vitals reviewed.      Assessment & Plan:  1. Uncontrolled type 2 diabetes mellitus with diabetic neuropathy, with long-term current use of insulin (HCC)  - POCT A1C- has improved from 9.3 to 7.1  - glipiZIDE (GLUCOTROL XL) 5 MG 24 hr tablet; Take 1 tablet (5 mg total) by mouth daily with breakfast.  Dispense: 90 tablet; Refill: 3 - No change in dose  - Continue low carb diet  - Drink plenty of water and exercise frequntly - Follow up in 3 months   [name], NP

## 2017-01-14 ENCOUNTER — Other Ambulatory Visit: Payer: Self-pay | Admitting: Adult Health

## 2017-01-16 NOTE — Telephone Encounter (Signed)
Sent to the pharmacy by e-scribe. 

## 2017-03-07 ENCOUNTER — Other Ambulatory Visit: Payer: Medicare Other | Admitting: *Deleted

## 2017-03-07 DIAGNOSIS — E782 Mixed hyperlipidemia: Secondary | ICD-10-CM

## 2017-03-07 LAB — LIPID PANEL
CHOLESTEROL TOTAL: 188 mg/dL (ref 100–199)
Chol/HDL Ratio: 2.1 ratio (ref 0.0–5.0)
HDL: 91 mg/dL (ref >39)
LDL CALC: 84 mg/dL (ref 0–99)
TRIGLYCERIDES: 64 mg/dL (ref 0–149)
VLDL CHOLESTEROL CAL: 13 mg/dL (ref 5–40)

## 2017-03-07 LAB — HEPATIC FUNCTION PANEL
ALK PHOS: 90 IU/L (ref 39–117)
ALT: 23 IU/L (ref 0–44)
AST: 27 IU/L (ref 0–40)
Albumin: 4.5 g/dL (ref 3.6–4.8)
Bilirubin Total: 0.5 mg/dL (ref 0.0–1.2)
Bilirubin, Direct: 0.12 mg/dL (ref 0.00–0.40)
Total Protein: 7.3 g/dL (ref 6.0–8.5)

## 2017-03-27 ENCOUNTER — Other Ambulatory Visit: Payer: Self-pay | Admitting: *Deleted

## 2017-03-27 DIAGNOSIS — I712 Thoracic aortic aneurysm, without rupture, unspecified: Secondary | ICD-10-CM

## 2017-03-30 ENCOUNTER — Other Ambulatory Visit: Payer: Self-pay | Admitting: Family Medicine

## 2017-03-30 DIAGNOSIS — E1165 Type 2 diabetes mellitus with hyperglycemia: Principal | ICD-10-CM

## 2017-03-30 DIAGNOSIS — IMO0002 Reserved for concepts with insufficient information to code with codable children: Secondary | ICD-10-CM

## 2017-03-30 DIAGNOSIS — Z794 Long term (current) use of insulin: Principal | ICD-10-CM

## 2017-03-30 DIAGNOSIS — E1140 Type 2 diabetes mellitus with diabetic neuropathy, unspecified: Secondary | ICD-10-CM

## 2017-03-30 MED ORDER — BASAGLAR KWIKPEN 100 UNIT/ML ~~LOC~~ SOPN: 23.0000 [IU] | PEN_INJECTOR | Freq: Every day | SUBCUTANEOUS | 0 refills | Status: DC

## 2017-03-30 NOTE — Telephone Encounter (Signed)
Sent to the pharmacy by e-scribe. 

## 2017-04-09 ENCOUNTER — Other Ambulatory Visit: Payer: Self-pay | Admitting: Adult Health

## 2017-04-11 NOTE — Telephone Encounter (Signed)
Sent to the pharmacy by e-scribe.  Pt now scheduled for cpx on 04/24/17.

## 2017-04-24 ENCOUNTER — Ambulatory Visit (INDEPENDENT_AMBULATORY_CARE_PROVIDER_SITE_OTHER): Payer: Medicare Other | Admitting: Adult Health

## 2017-04-24 ENCOUNTER — Ambulatory Visit (INDEPENDENT_AMBULATORY_CARE_PROVIDER_SITE_OTHER): Payer: Medicare Other

## 2017-04-24 ENCOUNTER — Encounter: Payer: Self-pay | Admitting: Adult Health

## 2017-04-24 VITALS — BP 120/68 | Temp 98.4°F | Ht 70.0 in | Wt 193.0 lb

## 2017-04-24 VITALS — BP 120/68 | Ht 70.0 in | Wt 193.0 lb

## 2017-04-24 DIAGNOSIS — Z23 Encounter for immunization: Secondary | ICD-10-CM | POA: Diagnosis not present

## 2017-04-24 DIAGNOSIS — E785 Hyperlipidemia, unspecified: Secondary | ICD-10-CM

## 2017-04-24 DIAGNOSIS — I10 Essential (primary) hypertension: Secondary | ICD-10-CM

## 2017-04-24 DIAGNOSIS — I25810 Atherosclerosis of coronary artery bypass graft(s) without angina pectoris: Secondary | ICD-10-CM

## 2017-04-24 DIAGNOSIS — E1149 Type 2 diabetes mellitus with other diabetic neurological complication: Secondary | ICD-10-CM | POA: Diagnosis not present

## 2017-04-24 DIAGNOSIS — IMO0002 Reserved for concepts with insufficient information to code with codable children: Secondary | ICD-10-CM

## 2017-04-24 DIAGNOSIS — Z0000 Encounter for general adult medical examination without abnormal findings: Secondary | ICD-10-CM

## 2017-04-24 DIAGNOSIS — Z125 Encounter for screening for malignant neoplasm of prostate: Secondary | ICD-10-CM | POA: Diagnosis not present

## 2017-04-24 DIAGNOSIS — E1165 Type 2 diabetes mellitus with hyperglycemia: Secondary | ICD-10-CM

## 2017-04-24 LAB — CBC WITH DIFFERENTIAL/PLATELET
BASOS PCT: 0.4 % (ref 0.0–3.0)
Basophils Absolute: 0 10*3/uL (ref 0.0–0.1)
EOS PCT: 0.9 % (ref 0.0–5.0)
Eosinophils Absolute: 0.1 10*3/uL (ref 0.0–0.7)
HCT: 35.5 % — ABNORMAL LOW (ref 39.0–52.0)
Hemoglobin: 11.9 g/dL — ABNORMAL LOW (ref 13.0–17.0)
LYMPHS ABS: 1.8 10*3/uL (ref 0.7–4.0)
Lymphocytes Relative: 20.5 % (ref 12.0–46.0)
MCHC: 33.6 g/dL (ref 30.0–36.0)
MCV: 90.9 fl (ref 78.0–100.0)
MONOS PCT: 7.1 % (ref 3.0–12.0)
Monocytes Absolute: 0.6 10*3/uL (ref 0.1–1.0)
NEUTROS ABS: 6.3 10*3/uL (ref 1.4–7.7)
NEUTROS PCT: 71.1 % (ref 43.0–77.0)
PLATELETS: 297 10*3/uL (ref 150.0–400.0)
RBC: 3.91 Mil/uL — ABNORMAL LOW (ref 4.22–5.81)
RDW: 13.9 % (ref 11.5–15.5)
WBC: 8.8 10*3/uL (ref 4.0–10.5)

## 2017-04-24 LAB — LIPID PANEL
Cholesterol: 178 mg/dL (ref 0–200)
HDL: 71.3 mg/dL (ref >39.00)
LDL Cholesterol: 96 mg/dL (ref 0–99)
NONHDL: 106.24
Total CHOL/HDL Ratio: 2
Triglycerides: 53 mg/dL (ref 0.0–149.0)
VLDL: 10.6 mg/dL (ref 0.0–40.0)

## 2017-04-24 LAB — MICROALBUMIN / CREATININE URINE RATIO
CREATININE, U: 284 mg/dL
MICROALB/CREAT RATIO: 2.9 mg/g (ref 0.0–30.0)
Microalb, Ur: 8.2 mg/dL — ABNORMAL HIGH (ref 0.0–1.9)

## 2017-04-24 LAB — BASIC METABOLIC PANEL
BUN: 14 mg/dL (ref 6–23)
CO2: 30 meq/L (ref 19–32)
Calcium: 9.9 mg/dL (ref 8.4–10.5)
Chloride: 103 mEq/L (ref 96–112)
Creatinine, Ser: 0.94 mg/dL (ref 0.40–1.50)
GFR: 102.09 mL/min (ref >60.00)
GLUCOSE: 90 mg/dL (ref 70–99)
POTASSIUM: 4.5 meq/L (ref 3.5–5.1)
SODIUM: 140 meq/L (ref 135–145)

## 2017-04-24 LAB — PSA: PSA: 1.37 ng/mL (ref 0.10–4.00)

## 2017-04-24 LAB — HEMOGLOBIN A1C: HEMOGLOBIN A1C: 9.2 % — AB (ref 4.6–6.5)

## 2017-04-24 LAB — HEPATIC FUNCTION PANEL
ALBUMIN: 4.3 g/dL (ref 3.5–5.2)
ALK PHOS: 63 U/L (ref 39–117)
ALT: 14 U/L (ref 0–53)
AST: 17 U/L (ref 0–37)
BILIRUBIN TOTAL: 0.6 mg/dL (ref 0.2–1.2)
Bilirubin, Direct: 0.1 mg/dL (ref 0.0–0.3)
Total Protein: 7.4 g/dL (ref 6.0–8.3)

## 2017-04-24 LAB — TSH: TSH: 1.13 u[IU]/mL (ref 0.35–4.50)

## 2017-04-24 NOTE — Progress Notes (Signed)
Subjective:    Patient ID: [name], male    DOB: 12/14/1945, 72 y.o.   MRN: [id number]  HPI  Patient presents for yearly preventative medicine examination. He is a pleasant 72 year old male  has a past medical history of Aortic stenosis, Carpal tunnel syndrome (09/24/2014), Coronary artery disease, Diabetes mellitus, type 2 ([place]), Erectile dysfunction, Essential hypertension, and Hyperlipidemia.  All immunizations and health maintenance protocols were reviewed with the patient and needed orders were placed. He is due for his influenza vaccination   Appropriate screening laboratory values were ordered for the patient including screening of hyperlipidemia, renal function and hepatic function. If indicated by BPH, a PSA was ordered.  Medication reconciliation,  past medical history, social history, problem list and allergies were reviewed in detail with the patient  Goals were established with regard to weight loss, exercise, and  diet in compliance with medications  End of life planning was discussed. He was given information on a HCPOA and living will.   He takes Lipitor 80 mg for hyperlipidemia   He takes glipizide 5 mg XL, metformin 1000 mg BID, and [name] 23 units QHS for diabetes. He has not been checking his blood sugars on a regular basis.  Lab Results  Component Value Date   HGBA1C 7.1 01/09/2017    Her takes Lopressor 75 mg BID for hypertension   He is followed by Cardiology d/t h/o CAD s/p CABG - Sept 2017 and Aortic Stenosis s/p AVR Sept 2017.   He has a follow up CT next week d/t AA - last was 4.4 cm  He is up to date on his colonoscopy and eye exam.   He denies any acute complaints.   Review of Systems  Constitutional: Negative.   HENT: Negative.   Eyes: Negative.   Respiratory: Negative.   Cardiovascular: Negative.   Gastrointestinal: Negative.   Endocrine: Negative.   Genitourinary: Negative.   Musculoskeletal: Negative.   Skin: Negative.     Allergic/Immunologic: Negative.   Neurological: Negative.   Hematological: Negative.   Psychiatric/Behavioral: Negative.   All other systems reviewed and are negative.  Past Medical History:  Diagnosis Date  . 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 Carpal tunnel syndrome 09/24/2014   Bilateral  . 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 Diabetes mellitus, type 2 ([place])   . Erectile dysfunction   . Essential hypertension   . Hyperlipidemia     Social History   Socioeconomic History  . Marital status: Married    Spouse name: Not on file  . Number of children: Not on file  . Years of education: Not on file  . Highest education level: Not on file  Social Needs  . Financial resource strain: Not on file  . Food insecurity - worry: Not on file  . Food insecurity - inability: Not on file  . Transportation needs - medical: Not on file  . Transportation needs - non-medical: Not on file  Occupational History  . Not on file  Tobacco Use  . Smoking status: Never Smoker  . Smokeless tobacco: Never Used  Substance and Sexual Activity  . Alcohol use: No  . Drug use: No  . Sexual activity: Yes  Other Topics Concern  . Not on file  Social History Narrative   Married to [name] for 35 years   Retired as Chiropodist   5 children  Never Smoked   Alcohol use- no   Caffeine use: occasional     Past Surgical History:  Procedure Laterality Date  . AORTIC VALVE REPLACEMENT N/A 10/29/2015   Procedure: AORTIC VALVE REPLACEMENT (AVR) WITH 23MM MAGNA EASE TISSUE VALVE.;  Surgeon: [name], MD;  Location: [place];  Service: Open Heart Surgery;  Laterality: N/A;  . CARDIAC CATHETERIZATION N/A 10/27/2015   Procedure: Left Heart Cath and Coronary Angiography;  Surgeon: [name], MD;  Location: [place] [facility];  Service: Cardiovascular;  Laterality: N/A;  . CORONARY ARTERY BYPASS GRAFT N/A 10/29/2015    Procedure: CORONARY ARTERY BYPASS GRAFTING (CABG) x Four UTILIZING THE LEFT INTERNAL MAMMARY ARTERY AND ENDOSCOPICALLY HARVESTED RIGHT SAPEHENEOUS VEINS.;  Surgeon: [name], MD;  Location: [place];  Service: Open Heart Surgery;  Laterality: N/A;  . CYST REMOVAL HAND Right   . ESOPHAGOGASTRODUODENOSCOPY N/A 12/02/2015   Procedure: ESOPHAGOGASTRODUODENOSCOPY (EGD);  Surgeon: [name], MD;  Location: [place];  Service: Gastroenterology;  Laterality: N/A;  . REPLACEMENT ASCENDING AORTA N/A 10/29/2015   Procedure: REPLACEMENT OF ASCENDING AORTA USING 34MM X 30CM WOVEN DOUBLE VELOUR VASCULAR GRAFT.;  Surgeon: [name], MD;  Location: [place];  Service: Open Heart Surgery;  Laterality: N/A;  . TEE WITHOUT CARDIOVERSION N/A 10/29/2015   Procedure: TRANSESOPHAGEAL ECHOCARDIOGRAM (TEE);  Surgeon: [name], MD;  Location: [facility];  Service: Open Heart Surgery;  Laterality: N/A;  . UVULECTOMY N/A 12/10/2015   Procedure: UVULECTOMY;  Surgeon: [name], MD;  Location: [facility] OR;  Service: ENT;  Laterality: N/A;    Family History  Problem Relation Age of Onset  . Cancer Father        Throat cancer  . Diabetes Sister     Allergies  Allergen Reactions  . Amiodarone Nausea And Vomiting    Current Outpatient Medications on File Prior to Visit  Medication Sig Dispense Refill  . aspirin EC 81 MG tablet Take 81 mg by mouth daily.    . B-D ULTRAFINE III SHORT PEN 31G X 8 MM MISC USE 4 TIMES DAILY AS DIRECTED 100 each 0  . glipiZIDE (GLUCOTROL XL) 5 MG 24 hr tablet Take 1 tablet (5 mg total) by mouth daily with breakfast. 90 tablet 3  . glucose blood test strip Test blood sugars daily. Dx: E11.9 100 each 12  . Insulin Glargine ([name] KWIKPEN) 100 UNIT/ML SOPN Inject 0.23 mLs (23 Units total) into the skin at bedtime. 5 pen 0  . metFORMIN (GLUCOPHAGE) 1000 MG tablet TAKE 1 TABLET (1,000 MG TOTAL) BY MOUTH 2 (TWO) TIMES DAILY WITH A MEAL. 180 tablet 0  . metoprolol  tartrate (LOPRESSOR) 25 MG tablet TAKE 3 TABLETS BY MOUTH TWICE A DAY 180 tablet 0  . atorvastatin (LIPITOR) 80 MG tablet Take 1 tablet (80 mg total) by mouth daily. 90 tablet 3  . hydrochlorothiazide (HYDRODIURIL) 25 MG tablet Take 1 tablet (25 mg total) by mouth daily. 90 tablet 1  . potassium chloride (K-DUR) 10 MEQ tablet Take 1 tablet (10 mEq total) by mouth daily. 90 tablet 1   No current facility-administered medications on file prior to visit.     BP 120/68 (BP Location: Left Arm)   Temp 98.4 F (36.9 C) (Oral)   Ht 5\' 10"  (1.778 m)   Wt 193 lb (87.5 kg)   BMI 27.69 kg/m       Objective:   Physical Exam  Constitutional: He is oriented to person, place, and time.  He appears well-developed and well-nourished. No distress.  HENT:  Head: Normocephalic and atraumatic.  Right Ear: External ear normal.  Left Ear: External ear normal.  Nose: Nose normal.  Mouth/Throat: Oropharynx is clear and moist. No oropharyngeal exudate.  Eyes: Conjunctivae and EOM are normal. Pupils are equal, round, and reactive to light. Right eye exhibits no discharge. Left eye exhibits no discharge. No scleral icterus.  Neck: Normal range of motion. Neck supple. No JVD present. Carotid bruit is not present. No tracheal deviation present. No thyroid mass and no thyromegaly present.  Cardiovascular: Normal rate, regular rhythm, normal heart sounds and intact distal pulses. Exam reveals no gallop and no friction rub.  No murmur heard. Pulmonary/Chest: Effort normal and breath sounds normal. No respiratory distress. He has no wheezes. He has no rales. He exhibits no tenderness.  Abdominal: Soft. Bowel sounds are normal. He exhibits no distension and no mass. There is no tenderness. There is no rebound and no guarding.  Genitourinary:  Genitourinary Comments: Refused DRE  Musculoskeletal: Normal range of motion. He exhibits no edema, tenderness or deformity.  Lymphadenopathy:    He has no cervical adenopathy.   Neurological: He is alert and oriented to person, place, and time. He has normal reflexes. No cranial nerve deficit. Coordination normal.  Skin: Skin is warm and dry. No rash noted. He is not diaphoretic. No erythema. No pallor.  Psychiatric: He has a normal mood and affect. His behavior is normal. Judgment and thought content normal.  Vitals reviewed.     Assessment & Plan:  1. Routine general medical examination at a health care facility  - Basic metabolic panel - CBC with Differential/Platelet - Hemoglobin A1c - Hepatic function panel - Lipid panel - TSH - PSA - Microalbumin / creatinine urine ratio  2. Coronary artery disease involving coronary bypass graft of native heart without angina pectoris  - Basic metabolic panel - CBC with Differential/Platelet - Hemoglobin A1c - Hepatic function panel - Lipid panel - TSH - PSA - Microalbumin / creatinine urine ratio  3. Essential hypertension - No change in medication at this time - Basic metabolic panel - CBC with Differential/Platelet - Hemoglobin A1c - Hepatic function panel - Lipid panel - TSH - PSA - Microalbumin / creatinine urine ratio  4. Hyperlipidemia, unspecified hyperlipidemia type - Basic metabolic panel - CBC with Differential/Platelet - Hemoglobin A1c - Hepatic function panel - Lipid panel - TSH - PSA - Microalbumin / creatinine urine ratio  5. Type II diabetes mellitus with neurological manifestations, uncontrolled ([place]) - Consider increase in [name].  - Encouraged frequent exercise and heart healthy diet  - Basic metabolic panel - CBC with Differential/Platelet - Hemoglobin A1c - Hepatic function panel - Lipid panel - TSH - PSA - Microalbumin / creatinine urine ratio  6. Need for prophylactic vaccination and inoculation against influenza  - Flu vaccine HIGH DOSE PF (Fluzone High dose)   [name], NP

## 2017-04-24 NOTE — Progress Notes (Signed)
Subjective:    Patient ID: [name], male    DOB: Apr 01, 1945, 72 y.o.   MRN: [id number]  HPI Patient presents for yearly preventative medicine examination. He has recently recovered from an upper respiratory infection and is still having a productive cough.  This cough has not warranted any OTC treatment. Otherwise he does not have complaints. 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  He is married.  His wife is still working full-time at [employer].     All immunizations and health maintenance protocols were reviewed with the patient and needed orders were placed. He is due annual influenza vaccine.   Appropriate screening laboratory values were ordered for the patient including screening of hyperlipidemia, renal function and hepatic function. If indicated by BPH, a PSA was ordered.  Medication reconciliation,  past medical history, social history, problem list and allergies were reviewed in detail with the patient. He does not need any medication refills at this time.   Goals were established with regard to weight loss, exercise, and  diet in compliance with medications. He does not get any regular exercise outside of work.  He is eating a balanced diet though he is only eating 2 meals a day and rarely 3 meals a day.  He is having some episodes of feeling hypoglycemic.  I have encouraged him to eat regular meals. He does not check his blood sugars routinely.  I have asked him to check these when he is having episodes consistent with hypoglycemia.   End of life planning was discussed. Send home with AD information.   He is due his eye exam in April.  He does not routinely see the dentist.     Review of Systems  Constitutional: Negative for chills, fatigue, fever and unexpected weight change.  HENT: Negative for congestion, rhinorrhea and sneezing.   Respiratory: Positive for cough and shortness of breath. Negative for chest tightness and  wheezing.   Cardiovascular: Negative for chest pain, palpitations and leg swelling.  Gastrointestinal: Negative for abdominal pain, blood in stool, constipation, diarrhea, nausea and vomiting.  Genitourinary: Negative for decreased urine volume, difficulty urinating, frequency, hematuria and urgency.  Musculoskeletal: Negative.   Neurological: Positive for dizziness. Negative for syncope, weakness, numbness and headaches.   Past Medical History:  Diagnosis Date  . 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 Carpal tunnel syndrome 09/24/2014   Bilateral  . 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 Diabetes mellitus, type 2 ([place])   . Erectile dysfunction   . Essential hypertension   . Hyperlipidemia     Social History   Socioeconomic History  . Marital status: Married    Spouse name: Not on file  . Number of children: Not on file  . Years of education: Not on file  . Highest education level: Not on file  Social Needs  . Financial resource strain: Not on file  . Food insecurity - worry: Not on file  . Food insecurity - inability: Not on file  . Transportation needs - medical: Not on file  . Transportation needs - non-medical: Not on file  Occupational History  . Not on file  Tobacco Use  . Smoking status: Never Smoker  . Smokeless tobacco: Never Used  Substance and Sexual Activity  . Alcohol use: No  . Drug use: No  . Sexual activity: Yes  Other Topics  Concern  . 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 AORTIC VALVE REPLACEMENT N/A 10/29/2015   Procedure: AORTIC VALVE REPLACEMENT (AVR) WITH 23MM MAGNA EASE TISSUE VALVE.;  Surgeon: [name], MD;  Location: [place];  Service: Open Heart Surgery;  Laterality:  N/A;  . CARDIAC CATHETERIZATION N/A 10/27/2015   Procedure: Left Heart Cath and Coronary Angiography;  Surgeon: [name], MD;  Location: [place] [facility];  Service: Cardiovascular;  Laterality: N/A;  . CORONARY ARTERY BYPASS GRAFT N/A 10/29/2015   Procedure: CORONARY ARTERY BYPASS GRAFTING (CABG) x Four UTILIZING THE LEFT INTERNAL MAMMARY ARTERY AND ENDOSCOPICALLY HARVESTED RIGHT SAPEHENEOUS VEINS.;  Surgeon: [name], MD;  Location: [place];  Service: Open Heart Surgery;  Laterality: N/A;  . CYST REMOVAL HAND Right   . ESOPHAGOGASTRODUODENOSCOPY N/A 12/02/2015   Procedure: ESOPHAGOGASTRODUODENOSCOPY (EGD);  Surgeon: [name], MD;  Location: [place];  Service: Gastroenterology;  Laterality: N/A;  . REPLACEMENT ASCENDING AORTA N/A 10/29/2015   Procedure: REPLACEMENT OF ASCENDING AORTA USING 34MM X 30CM WOVEN DOUBLE VELOUR VASCULAR GRAFT.;  Surgeon: [name], MD;  Location: [place];  Service: Open Heart Surgery;  Laterality: N/A;  . TEE WITHOUT CARDIOVERSION N/A 10/29/2015   Procedure: TRANSESOPHAGEAL ECHOCARDIOGRAM (TEE);  Surgeon: [name], MD;  Location: [place];  Service: Open Heart Surgery;  Laterality: N/A;  . UVULECTOMY N/A 12/10/2015   Procedure: UVULECTOMY;  Surgeon: [name], MD;  Location: [facility] OR;  Service: ENT;  Laterality: N/A;    Family History  Problem Relation Age of Onset  . Cancer Father        Throat cancer  . Diabetes Sister     Allergies  Allergen Reactions  . Amiodarone Nausea And Vomiting    Current Outpatient Medications on File Prior to Visit  Medication Sig Dispense Refill  . aspirin EC 81 MG tablet Take 81 mg by mouth daily.    . B-D ULTRAFINE III SHORT PEN 31G X 8 MM MISC USE 4 TIMES DAILY AS DIRECTED 100 each 0  . glipiZIDE (GLUCOTROL XL) 5 MG 24 hr tablet Take 1 tablet (5 mg total) by mouth daily with breakfast. 90 tablet 3  . glucose blood test strip Test blood sugars daily. Dx: E11.9 100 each 12  .  Insulin Glargine ([name] KWIKPEN) 100 UNIT/ML SOPN Inject 0.23 mLs (23 Units total) into the skin at bedtime. 5 pen 0  . metFORMIN (GLUCOPHAGE) 1000 MG tablet TAKE 1 TABLET (1,000 MG TOTAL) BY MOUTH 2 (TWO) TIMES DAILY WITH A MEAL. 180 tablet 0  . metoprolol tartrate (LOPRESSOR) 25 MG tablet TAKE 3 TABLETS BY MOUTH TWICE A DAY 180 tablet 0  . atorvastatin (LIPITOR) 80 MG tablet Take 1 tablet (80 mg total) by mouth daily. 90 tablet 3  . hydrochlorothiazide (HYDRODIURIL) 25 MG tablet Take 1 tablet (25 mg total) by mouth daily. 90 tablet 1  . potassium chloride (K-DUR) 10 MEQ tablet Take 1 tablet (10 mEq total) by mouth daily. 90 tablet 1   No current facility-administered medications on file prior to visit.     BP 120/68 (BP Location: Left Arm)   Temp 98.4 F (36.9 C) (Oral)   Ht 5\' 10"  (1.778 m)  Wt 193 lb (87.5 kg)   BMI 27.69 kg/m       Objective:   Physical Exam  Constitutional: He is oriented to person, place, and time. He appears well-developed and well-nourished. No distress.  HENT:  Head: Normocephalic and atraumatic.  Right Ear: Tympanic membrane normal.  Left Ear: Tympanic membrane normal.  Nose: Mucosal edema present. No rhinorrhea.  Mouth/Throat: Uvula is midline, oropharynx is clear and moist and mucous membranes are normal. No oropharyngeal exudate, posterior oropharyngeal edema or posterior oropharyngeal erythema.  Inferior turbinates are erythematous.  Eyes: Pupils are equal, round, and reactive to light. Right eye exhibits no discharge. Left eye exhibits no discharge.  Neck: Normal range of motion. Neck supple. No thyromegaly present.  Cardiovascular: Normal rate, regular rhythm and intact distal pulses. Exam reveals no gallop and no friction rub.  Murmur heard. Pulmonary/Chest: Effort normal and breath sounds normal. No respiratory distress. He has no wheezes. He has no rales.  Abdominal: Soft. Bowel sounds are normal. He exhibits no distension and no mass. There  is no tenderness. There is no rebound and no guarding.  Genitourinary:  Genitourinary Comments: DRE deferred per patient.   Musculoskeletal: Normal range of motion. He exhibits no edema or tenderness.  Lymphadenopathy:    He has cervical adenopathy.  Neurological: He is alert and oriented to person, place, and time. He displays normal reflexes. He exhibits normal muscle tone.  Skin: Skin is warm and dry. No rash noted. He is not diaphoretic. No erythema.  Psychiatric: He has a normal mood and affect. His behavior is normal. Judgment and thought content normal.  Nursing note and vitals reviewed.     Assessment & Plan:  1. Routine general medical examination at a health care facility Will check labs and notify of results. Continue to work on diet and exercise.  - Basic metabolic panel - CBC with Differential/Platelet - Hemoglobin A1c - Hepatic function panel - Lipid panel - TSH - PSA - Microalbumin / creatinine urine ratio  2. Coronary artery disease involving coronary bypass graft of native heart without angina pectoris No active chest pain.  Followed by cardiology. Will check labs and notify of results.  - Basic metabolic panel - CBC with Differential/Platelet - Hemoglobin A1c - Hepatic function panel - Lipid panel - TSH - PSA - Microalbumin / creatinine urine ratio  3. Essential hypertension Blood pressure is well controlled.  Will continue to monitor. - Basic metabolic panel - CBC with Differential/Platelet - Hemoglobin A1c - Hepatic function panel - Lipid panel - TSH - PSA - Microalbumin / creatinine urine ratio  4. Hyperlipidemia, unspecified hyperlipidemia type Continue to work on diet and exercise.  Will check labs and notify.  - Basic metabolic panel - CBC with Differential/Platelet - Hemoglobin A1c - Hepatic function panel - Lipid panel - TSH - PSA - Microalbumin / creatinine urine ratio  5. Type II diabetes mellitus with neurological manifestations,  uncontrolled ([place]) Does not routinely check blood sugars at home. Please check fasting blood sugar. - Basic metabolic panel - CBC with Differential/Platelet - Hemoglobin A1c - Hepatic function panel - Lipid panel - TSH - PSA - Microalbumin / creatinine urine ratio  6. Need for prophylactic vaccination and inoculation against influenza Will give influenza vaccine.  - Flu vaccine HIGH DOSE PF (Fluzone High dose)  Andrews Tener C Maleeya Peterkin BSN RN NP student

## 2017-04-24 NOTE — Progress Notes (Signed)
I have reviewed and agree with this plan  

## 2017-04-24 NOTE — Progress Notes (Signed)
Subjective:   [name] is a 72 y.o. male who presents for Medicare Annual/Initial preventive examination.  Reports health as pretty good Sometimes he gets dizzy;  Type 2 DM with Diabetic retinopathy CABG 2017  Aortic Valve replacement 10/2015 Difficult recovery; had to go back to the hospital x 6.   Diet Eats 2 meals a day but does carry a snack when he gets up early am and goes to work  Chol/hdl ratio 2.1  A1c 7.1  Checked 12/2016/ rechecking today BMI 27.7 Gets up at 4am  Works 4 hours at a school Off at [employer];  Eats at [employer] - eggs and toast;  He cooks; lamb-chops; salmon; broccoli  Adds beans and corn bread and cabbage Checks BS during the day sometimes;  Am 4am 140;  Checks it 12 noon; 128 Goes up some in the evening  Sometimes takes a peanut butter sandwich  Keeps pepsi around if he needs it or other sugar snack   Exercise Works for school 4 hours on his feet Also walks in the gym x [place] Maintenance Due  Topic Date Due  . URINE MICROALBUMIN  03/24/2017   Ur Microalbumin drawn today  Diabetic eye exam 05/2016 Has an apt coming up  Colonoscopy 08/2015 - 2022  PSA 03/2016  Educated regarding shingrix   Meds has to pay 100.00 co-pay  Sometimes he does not have the money to pay it  [name] to fup with [employer] rep if still available     Objective:    Vitals: BP 120/68   Ht 5\' 10"  (1.778 m)   Wt 193 lb (87.5 kg)   BMI 27.69 kg/m   Body mass index is 27.69 kg/m.  Advanced Directives 02/01/2016 12/05/2015 11/30/2015 11/24/2015 11/24/2015 11/13/2015 10/27/2015  Does Patient Have a Medical Advance Directive? No No No No No No No  Would patient like information on creating a medical advance directive? - No - patient declined information No - patient declined information No - patient declined information No - patient declined information - Yes - Scientist, clinical (histocompatibility and immunogenetics) given   Reviewed by [name]   Tobacco Social History   Tobacco Use  Smoking  Status Never Smoker  Smokeless Tobacco Never Used     Counseling given: Yes   Clinical Intake:     Past Medical History:  Diagnosis Date  . 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 Carpal tunnel syndrome 09/24/2014   Bilateral  . 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 Diabetes mellitus, type 2 ([place])   . Erectile dysfunction   . Essential hypertension   . Hyperlipidemia    Past Surgical History:  Procedure Laterality Date  . AORTIC VALVE REPLACEMENT N/A 10/29/2015   Procedure: AORTIC VALVE REPLACEMENT (AVR) WITH 23MM MAGNA EASE TISSUE VALVE.;  Surgeon: [name], MD;  Location: [place];  Service: Open Heart Surgery;  Laterality: N/A;  . CARDIAC CATHETERIZATION N/A 10/27/2015   Procedure: Left Heart Cath and Coronary Angiography;  Surgeon: [name], MD;  Location: [place] [facility];  Service: Cardiovascular;  Laterality: N/A;  . CORONARY ARTERY BYPASS GRAFT N/A 10/29/2015   Procedure: CORONARY ARTERY BYPASS GRAFTING (CABG) x Four UTILIZING THE LEFT INTERNAL MAMMARY ARTERY AND ENDOSCOPICALLY HARVESTED RIGHT SAPEHENEOUS VEINS.;  Surgeon: [name], MD;  Location: [facility];  Service: Open Heart Surgery;  Laterality: N/A;  . CYST REMOVAL HAND  Right   . ESOPHAGOGASTRODUODENOSCOPY N/A 12/02/2015   Procedure: ESOPHAGOGASTRODUODENOSCOPY (EGD);  Surgeon: [name], MD;  Location: [facility];  Service: Gastroenterology;  Laterality: N/A;  . REPLACEMENT ASCENDING AORTA N/A 10/29/2015   Procedure: REPLACEMENT OF ASCENDING AORTA USING 34MM X 30CM WOVEN DOUBLE VELOUR VASCULAR GRAFT.;  Surgeon: [name], MD;  Location: [place];  Service: Open Heart Surgery;  Laterality: N/A;  . TEE WITHOUT CARDIOVERSION N/A 10/29/2015   Procedure: TRANSESOPHAGEAL ECHOCARDIOGRAM (TEE);  Surgeon: [name], MD;  Location: [facility];  Service: Open Heart Surgery;  Laterality: N/A;  . UVULECTOMY N/A 12/10/2015    Procedure: UVULECTOMY;  Surgeon: [name], MD;  [facility];  Service: ENT;  Laterality: N/A;   Family History  Problem Relation Age of Onset  . Cancer Father        Throat cancer  . Diabetes Sister    Social History   Socioeconomic History  . Marital status: Married    Spouse name: Not on file  . Number of children: Not on file  . Years of education: Not on file  . Highest education level: Not on file  Social Needs  . Financial resource strain: Not on file  . Food insecurity - worry: Not on file  . Food insecurity - inability: Not on file  . Transportation needs - medical: Not on file  . Transportation needs - non-medical: Not on file  Occupational History  . Not on file  Tobacco Use  . Smoking status: Never Smoker  . Smokeless tobacco: Never Used  Substance and Sexual Activity  . Alcohol use: No  . Drug use: No  . Sexual activity: Yes  Other Topics Concern  . Not on file  Social History Narrative   Married to [name] for 63 years   Retired as Chiropodist   5 children   Never Smoked   Alcohol use- no   Caffeine use: occasional       Lost one child a few months ago    73 his sister, then son and then nephew - all within 2 weeks apart   Son had a stroke at 38    Was planning to get married last month in Feb.     Outpatient Encounter Medications as of 04/24/2017  Medication Sig  . aspirin EC 81 MG tablet Take 81 mg by mouth daily.  . B-D ULTRAFINE III SHORT PEN 31G X 8 MM MISC USE 4 TIMES DAILY AS DIRECTED  . glipiZIDE (GLUCOTROL XL) 5 MG 24 hr tablet Take 1 tablet (5 mg total) by mouth daily with breakfast.  . glucose blood test strip Test blood sugars daily. Dx: E11.9  . Insulin Glargine ([name] KWIKPEN) 100 UNIT/ML SOPN Inject 0.23 mLs (23 Units total) into the skin at bedtime.  . metFORMIN (GLUCOPHAGE) 1000 MG tablet TAKE 1 TABLET (1,000 MG TOTAL) BY MOUTH 2 (TWO) TIMES DAILY WITH A MEAL.  . metoprolol tartrate (LOPRESSOR) 25 MG  tablet TAKE 3 TABLETS BY MOUTH TWICE A DAY  . atorvastatin (LIPITOR) 80 MG tablet Take 1 tablet (80 mg total) by mouth daily.  . hydrochlorothiazide (HYDRODIURIL) 25 MG tablet Take 1 tablet (25 mg total) by mouth daily.  . potassium chloride (K-DUR) 10 MEQ tablet Take 1 tablet (10 mEq total) by mouth daily.   No facility-administered encounter medications on file as of 04/24/2017.     Activities of Daily Living In your present state of health, do you have any difficulty performing  the following activities: 04/24/2017  Hearing? N  Vision? N  Difficulty concentrating or making decisions? N  Walking or climbing stairs? N  Comment had stairs in home   Dressing or bathing? N  Doing errands, shopping? N  Preparing Food and eating ? N  Using the Toilet? N  In the past six months, have you accidently leaked urine? N  Do you have problems with loss of bowel control? N  Managing your Medications? N  Comment insulin is expensive   Managing your Finances? N  Housekeeping or managing your Housekeeping? N  Some recent data might be hidden    Patient Care Team: [name], NP as PCP - General (Family Medicine) Alda Berthold, DO as Consulting Physician (Neurology) Berle Mull, MD as Consulting Physician (Family Medicine) [name], [name], MD as Consulting Physician (Cardiology)   Assessment:   This is a routine wellness examination for [place].  Exercise Activities and Dietary recommendations Current Exercise Habits: Home exercise routine, Type of exercise: walking, Time (Minutes): 60, Frequency (Times/Week): 4, Weekly Exercise (Minutes/Week): 240, Intensity: Moderate  Goals    . Patient Stated     Keep walking and going forward        Fall Risk Fall Risk  04/24/2017 04/24/2017 04/03/2014 03/03/2013  Falls in the past year? No No No No     Depression Screen PHQ 2/9 Scores 04/24/2017 04/24/2017 05/19/2016 02/16/2016  PHQ - 2 Score 0 0 0 0    Cognitive Function MMSE - Mini Mental  State Exam 04/24/2017  Not completed: (No Data)   Ad8 score reviewed for issues:  Issues making decisions:  Less interest in hobbies / activities:  Repeats questions, stories (family complaining):  Trouble using ordinary gadgets (microwave, computer, phone):  Forgets the month or year:   Mismanaging finances:   Remembering appts:  Daily problems with thinking and/or memory: Ad8 score is=0          Immunization History  Administered Date(s) Administered  . Influenza Whole 03/15/2009, 04/06/2010  . Influenza, High Dose Seasonal PF 12/29/2013, 12/25/2014, 04/24/2017  . Influenza,inj,Quad PF,6+ Mos 11/12/2012, 11/25/2015  . Pneumococcal Conjugate-13 04/24/2014  . Pneumococcal Polysaccharide-23 12/13/2007, 11/25/2015  . Td 09/13/2008     Screening Tests Health Maintenance  Topic Date Due  . URINE MICROALBUMIN  03/24/2017  . OPHTHALMOLOGY EXAM  06/09/2017  . HEMOGLOBIN A1C  07/09/2017  . FOOT EXAM  04/25/2018  . TETANUS/TDAP  09/14/2018  . COLONOSCOPY  09/01/2020  . INFLUENZA VACCINE  Completed  . Hepatitis C Screening  Completed  . PNA vac Low Risk Adult  Completed       Plan:      PCP Notes   Health Maintenance Educated regarding shingrix  Diabetic eye exam due in April and thinks this has been scheduled  If he happens to need a tetanus, educated to take the tdap although it is not due now.   Abnormal Screens  none  Referrals  none  Patient concerns; The patient states he is missing doses of [name] as it is 100.00 copy and can't pay all the time. Will fup for assistance   Nurse Concerns; As noted  Next PCP apt Seen today       I have personally reviewed and noted the following in the patient's chart:   . Medical and social history . Use of alcohol, tobacco or illicit drugs  . Current medications and supplements . Functional ability and status . Nutritional status . Physical activity . Advanced directives .  List of other  physicians . Hospitalizations, surgeries, and ER visits in previous 12 months . Vitals . Screenings to include cognitive, depression, and falls . Referrals and appointments  In addition, I have reviewed and discussed with patient certain preventive protocols, quality metrics, and best practice recommendations. A written personalized care plan for preventive services as well as general preventive health recommendations were provided to patient.     Wynetta Fines, RN  04/24/2017

## 2017-04-24 NOTE — Patient Instructions (Addendum)
Mr. Bera , Thank you for taking time to come for your Medicare Wellness Visit. I appreciate your ongoing commitment to your health goals. Please review the following plan we discussed and let me know if I can assist you in the future.   You can see if there is patient assistance for [name] from [place] [name] will outreach the representative    Diabetic eye exam due in April   Shingrix is a vaccine for the prevention of Shingles in Adults 70 and older.  If you are on Medicare, you can request a prescription from your doctor to be filled at a pharmacy.  Please check with your benefits regarding applicable copays or out of pocket expenses.  The Shingrix is given in 2 vaccines approx 8 weeks apart. You must receive the 2nd dose prior to 6 months from receipt of the first.   May consider bathroom remodel for walk in shower   A Tetanus is recommended every 10 years. Medicare covers a tetanus if you have a cut or wound; otherwise, there may be a charge. If you had not had a tetanus with pertusses, known as the Tdap, you can take this anytime.     These are the goals we discussed: Goals    . Patient Stated     Keep walking and going forward        This is a list of the screening recommended for you and due dates:  Health Maintenance  Topic Date Due  . Urine Protein Check  03/24/2017  . Eye exam for diabetics  06/09/2017  . Hemoglobin A1C  07/09/2017  . Complete foot exam   04/25/2018  . Tetanus Vaccine  09/14/2018  . Colon Cancer Screening  09/01/2020  . Flu Shot  Completed  .  Hepatitis C: One time screening is recommended by Center for Disease Control  (CDC) for  adults born from [birth year] through 1965.   Completed  . Pneumonia vaccines  Completed     Prevention of falls: Remove rugs or any tripping hazards in the home Use Non slip mats in bathtubs and showers Placing grab bars next to the toilet and or shower Placing handrails on both sides of the stair  way Adding extra lighting in the home.   Personal safety issues reviewed:  1. Consider starting a community watch program per [facility] 2.  Changes batteries is smoke detector and/or carbon monoxide detector  3.  If you have firearms; keep them in a safe place 4.  Wear protection when in the sun; Always wear sunscreen or a hat; It is good to have your doctor check your skin annually or review any new areas of concern 5. Driving safety; Keep in the right lane; stay 3 car lengths behind the car in front of you on the highway; look 3 times prior to pulling out; carry your cell phone everywhere you go!    Learn about the Yellow Dot program:  The program allows first responders at your emergency to have access to who your physician is, as well as your medications and medical conditions.  Citizens requesting the Yellow Dot Packages should contact Master Corporal Nunzio Cobbs at the [facility] [phone] for the first week of the program and beginning the week after Easter citizens should contact their Scientist, physiological.    Fall Prevention in the Home Falls can cause injuries. They can happen to people of all ages. There are many things  you can do to make your home safe and to help prevent falls. What can I do on the outside of my home?  Regularly fix the edges of walkways and driveways and fix any cracks.  Remove anything that might make you trip as you walk through a door, such as a raised step or threshold.  Trim any bushes or trees on the path to your home.  Use bright outdoor lighting.  Clear any walking paths of anything that might make someone trip, such as rocks or tools.  Regularly check to see if handrails are loose or broken. Make sure that both sides of any steps have handrails.  Any raised decks and porches should have guardrails on the edges.  Have any leaves, snow, or ice cleared regularly.  Use sand or salt on walking  paths during winter.  Clean up any spills in your garage right away. This includes oil or grease spills. What can I do in the bathroom?  Use night lights.  Install grab bars by the toilet and in the tub and shower. Do not use towel bars as grab bars.  Use non-skid mats or decals in the tub or shower.  If you need to sit down in the shower, use a plastic, non-slip stool.  Keep the floor dry. Clean up any water that spills on the floor as soon as it happens.  Remove soap buildup in the tub or shower regularly.  Attach bath mats securely with double-sided non-slip rug tape.  Do not have throw rugs and other things on the floor that can make you trip. What can I do in the bedroom?  Use night lights.  Make sure that you have a light by your bed that is easy to reach.  Do not use any sheets or blankets that are too big for your bed. They should not hang down onto the floor.  Have a firm chair that has side arms. You can use this for support while you get dressed.  Do not have throw rugs and other things on the floor that can make you trip. What can I do in the kitchen?  Clean up any spills right away.  Avoid walking on wet floors.  Keep items that you use a lot in easy-to-reach places.  If you need to reach something above you, use a strong step stool that has a grab bar.  Keep electrical cords out of the way.  Do not use floor polish or wax that makes floors slippery. If you must use wax, use non-skid floor wax.  Do not have throw rugs and other things on the floor that can make you trip. What can I do with my stairs?  Do not leave any items on the stairs.  Make sure that there are handrails on both sides of the stairs and use them. Fix handrails that are broken or loose. Make sure that handrails are as long as the stairways.  Check any carpeting to make sure that it is firmly attached to the stairs. Fix any carpet that is loose or worn.  Avoid having throw rugs at  the top or bottom of the stairs. If you do have throw rugs, attach them to the floor with carpet tape.  Make sure that you have a light switch at the top of the stairs and the bottom of the stairs. If you do not have them, ask someone to add them for you. What else can I do to help prevent falls?  Wear shoes that: ? Do not have high heels. ? Have rubber bottoms. ? Are comfortable and fit you well. ? Are closed at the toe. Do not wear sandals.  If you use a stepladder: ? Make sure that it is fully opened. Do not climb a closed stepladder. ? Make sure that both sides of the stepladder are locked into place. ? Ask someone to hold it for you, if possible.  Clearly mark and make sure that you can see: ? Any grab bars or handrails. ? First and last steps. ? Where the edge of each step is.  Use tools that help you move around (mobility aids) if they are needed. These include: ? Canes. ? Walkers. ? Scooters. ? Crutches.  Turn on the lights when you go into a dark area. Replace any light bulbs as soon as they burn out.  Set up your furniture so you have a clear path. Avoid moving your furniture around.  If any of your floors are uneven, fix them.  If there are any pets around you, be aware of where they are.  Review your medicines with your doctor. Some medicines can make you feel dizzy. This can increase your chance of falling. Ask your doctor what other things that you can do to help prevent falls. This information is not intended to replace advice given to you by your health care provider. Make sure you discuss any questions you have with your health care provider. Document Released: 11/26/2008 Document Revised: 07/08/2015 Document Reviewed: 03/06/2014 Elsevier Interactive Patient Education  2018 [place] Maintenance, Male A healthy lifestyle and preventive care is important for your health and wellness. Ask your health care provider about what schedule of regular  examinations is right for you. What should I know about weight and diet? Eat a Healthy Diet  Eat plenty of vegetables, fruits, whole grains, low-fat dairy products, and lean protein.  Do not eat a lot of foods high in solid fats, added sugars, or salt.  Maintain a Healthy Weight Regular exercise can help you achieve or maintain a healthy weight. You should:  Do at least 150 minutes of exercise each week. The exercise should increase your heart rate and make you sweat (moderate-intensity exercise).  Do strength-training exercises at least twice a week.  Watch Your Levels of Cholesterol and Blood Lipids  Have your blood tested for lipids and cholesterol every 5 years starting at 70 years of age. If you are at high risk for heart disease, you should start having your blood tested when you are 70 years old. You may need to have your cholesterol levels checked more often if: ? Your lipid or cholesterol levels are high. ? You are older than 70 years of age. ? You are at high risk for heart disease.  What should I know about cancer screening? Many types of cancers can be detected early and may often be prevented. Lung Cancer  You should be screened every year for lung cancer if: ? You are a current smoker who has smoked for at least 30 years. ? You are a former smoker who has quit within the past 15 years.  Talk to your health care provider about your screening options, when you should start screening, and how often you should be screened.  Colorectal Cancer  Routine colorectal cancer screening usually begins at 70 years of age and should be repeated every 5-10 years until you are 70 years old. You may need to be  screened more often if early forms of precancerous polyps or small growths are found. Your health care provider may recommend screening at an earlier age if you have risk factors for colon cancer.  Your health care provider may recommend using home test kits to check for hidden  blood in the stool.  A small camera at the end of a tube can be used to examine your colon (sigmoidoscopy or colonoscopy). This checks for the earliest forms of colorectal cancer.  Prostate and Testicular Cancer  Depending on your age and overall health, your health care provider may do certain tests to screen for prostate and testicular cancer.  Talk to your health care provider about any symptoms or concerns you have about testicular or prostate cancer.  Skin Cancer  Check your skin from head to toe regularly.  Tell your health care provider about any new moles or changes in moles, especially if: ? There is a change in a mole's size, shape, or color. ? You have a mole that is larger than a pencil eraser.  Always use sunscreen. Apply sunscreen liberally and repeat throughout the day.  Protect yourself by wearing long sleeves, pants, a wide-brimmed hat, and sunglasses when outside.  What should I know about heart disease, diabetes, and high blood pressure?  If you are 10-19 years of age, have your blood pressure checked every 3-5 years. If you are 77 years of age or older, have your blood pressure checked every year. You should have your blood pressure measured twice-once when you are at a hospital or clinic, and once when you are not at a hospital or clinic. Record the average of the two measurements. To check your blood pressure when you are not at a hospital or clinic, you can use: ? An automated blood pressure machine at a pharmacy. ? A home blood pressure monitor.  Talk to your health care provider about your target blood pressure.  If you are between 46-56 years old, ask your health care provider if you should take aspirin to prevent heart disease.  Have regular diabetes screenings by checking your fasting blood sugar level. ? If you are at a normal weight and have a low risk for diabetes, have this test once every three years after the age of 100. ? If you are overweight and  have a high risk for diabetes, consider being tested at a younger age or more often.  A one-time screening for abdominal aortic aneurysm (AAA) by ultrasound is recommended for men aged 65-75 years who are current or former smokers. What should I know about preventing infection? Hepatitis B If you have a higher risk for hepatitis B, you should be screened for this virus. Talk with your health care provider to find out if you are at risk for hepatitis B infection. Hepatitis C Blood testing is recommended for:  Everyone born from [birth year] through 1963.  Anyone with known risk factors for hepatitis C.  Sexually Transmitted Diseases (STDs)  You should be screened each year for STDs including gonorrhea and chlamydia if: ? You are sexually active and are younger than 70 years of age. ? You are older than 70 years of age and your health care provider tells you that you are at risk for this type of infection. ? Your sexual activity has changed since you were last screened and you are at an increased risk for chlamydia or gonorrhea. Ask your health care provider if you are at risk.  Talk with your health  care provider about whether you are at high risk of being infected with HIV. Your health care provider may recommend a prescription medicine to help prevent HIV infection.  What else can I do?  Schedule regular health, dental, and eye exams.  Stay current with your vaccines (immunizations).  Do not use any tobacco products, such as cigarettes, chewing tobacco, and e-cigarettes. If you need help quitting, ask your health care provider.  Limit alcohol intake to no more than 2 drinks per day. One drink equals 12 ounces of beer, 5 ounces of wine, or 1 ounces of hard liquor.  Do not use street drugs.  Do not share needles.  Ask your health care provider for help if you need support or information about quitting drugs.  Tell your health care provider if you often feel depressed.  Tell your  health care provider if you have ever been abused or do not feel safe at home. This information is not intended to replace advice given to you by your health care provider. Make sure you discuss any questions you have with your health care provider. Document Released: 07/29/2007 Document Revised: 09/29/2015 Document Reviewed: 11/03/2014 Elsevier Interactive Patient Education  [employer].

## 2017-04-25 ENCOUNTER — Telehealth: Payer: Self-pay

## 2017-04-25 NOTE — Telephone Encounter (Signed)
[name] in for AWV yesterday. States at times, he does not pick up his [name] due to cost. (100.00 ) for copay.  Noted [name] savings card online through this year. Call to rep (DAra) to see if he can use this.  Tonette Bihari [phone]

## 2017-04-27 ENCOUNTER — Other Ambulatory Visit: Payer: Self-pay | Admitting: Family Medicine

## 2017-04-27 ENCOUNTER — Encounter: Payer: Self-pay | Admitting: Family Medicine

## 2017-04-27 MED ORDER — GLIPIZIDE ER 10 MG PO TB24: 10.0000 mg | ORAL_TABLET | Freq: Every day | ORAL | 0 refills | Status: DC

## 2017-04-27 NOTE — Telephone Encounter (Signed)
Outreach to Mr. Corvin but -no answer to give him the [employer] patient assistance number; [phone].  Zachery Dauer is assisting as needed with samples and will discuss other options if the patient assistance is not helpful  Wynetta Fines RN

## 2017-05-01 ENCOUNTER — Ambulatory Visit: Payer: Self-pay | Admitting: Cardiothoracic Surgery

## 2017-05-01 ENCOUNTER — Ambulatory Visit
Admission: RE | Admit: 2017-05-01 | Discharge: 2017-05-01 | Disposition: A | Discharge status: Home / Self Care | Payer: Medicare Other | Source: Ambulatory Visit | Attending: Cardiothoracic Surgery | Admitting: Cardiothoracic Surgery

## 2017-05-01 ENCOUNTER — Other Ambulatory Visit: Payer: Self-pay

## 2017-05-01 DIAGNOSIS — I712 Thoracic aortic aneurysm, without rupture, unspecified: Secondary | ICD-10-CM

## 2017-05-01 MED ORDER — IOPAMIDOL (ISOVUE-370) INJECTION 76%
75.0000 mL | Freq: Once | INTRAVENOUS | Status: AC | PRN
  Administered 2017-05-01: 75 mL via INTRAVENOUS

## 2017-05-03 NOTE — Telephone Encounter (Signed)
Outreach to mr. Morson and had to leave a message on VM with the patient assistance number at [facility] for assistance with insulin. I will be available tomorrow at [facility] for further questions. Left my direct line [phone] Wynetta Fines RN

## 2017-05-07 ENCOUNTER — Other Ambulatory Visit: Payer: Self-pay | Admitting: Adult Health

## 2017-05-07 ENCOUNTER — Ambulatory Visit: Payer: Medicare Other | Admitting: Cardiothoracic Surgery

## 2017-05-08 ENCOUNTER — Other Ambulatory Visit: Payer: Self-pay

## 2017-05-08 ENCOUNTER — Ambulatory Visit: Payer: Medicare Other | Admitting: Cardiothoracic Surgery

## 2017-05-08 ENCOUNTER — Encounter: Payer: Self-pay | Admitting: Cardiothoracic Surgery

## 2017-05-08 VITALS — BP 114/70 | HR 76 | Resp 18 | Ht 70.0 in | Wt 193.4 lb

## 2017-05-08 DIAGNOSIS — I712 Thoracic aortic aneurysm, without rupture, unspecified: Secondary | ICD-10-CM

## 2017-05-08 NOTE — Progress Notes (Signed)
[address]       [place]is,[place] [address]             [phone]      [name]  Medical Record #[id number] Date of Birth: Mar 02, 1945  Referring: [name], [name], MD Primary Care: [name], NP  Chief Complaint:   POST OP FOLLOW UP 10/29/2015   OPERATIVE REPORT  PREOPERATIVE DIAGNOSES:  Severe 3-vessel coronary occlusive disease. Bicuspid aortic valve with moderate to severe aortic stenosis.  Dilated ascending aorta 4.7 cm. PROCEDURE PERFORMED: 1. Coronary artery bypass grafting x4 with left internal mammary to     the left anterior descending coronary artery, sequential reverse     saphenous vein graft to the first and second obtuse marginal,     reverse saphenous vein graft to the distal right coronary artery     with right vein greater saphenous endo vein harvesting. 2. Replacement of aortic valve with pericardial tissue valve Edwards     [employer], model 3300 TFX 23 mm, serial #[id number] supra coronary     replacement of ascending aorta with 34-mm Hemashield graft,     Wheat procedure. SURGEON:  [name], MD.  History of Present Illness:     The patient has no signs or symptoms of s congestive heart failure or angina.  Patient denies any signs or symptoms of congestive heart failure or angina.  He notes that his activity level remains good.    Past Surgical History:  Procedure Laterality Date  . AORTIC VALVE REPLACEMENT N/A 10/29/2015   Procedure: AORTIC VALVE REPLACEMENT (AVR) WITH 23MM MAGNA EASE TISSUE VALVE.;  Surgeon: [name], MD;  Location: [place];  Service: Open Heart Surgery;  Laterality: N/A;  . CARDIAC CATHETERIZATION N/A 10/27/2015   Procedure: Left Heart Cath and Coronary Angiography;  Surgeon: [name], MD;  Location: [place] [facility];  Service: Cardiovascular;  Laterality: N/A;  . CORONARY ARTERY BYPASS GRAFT N/A 10/29/2015   Procedure: CORONARY ARTERY BYPASS GRAFTING (CABG) x Four  UTILIZING THE LEFT INTERNAL MAMMARY ARTERY AND ENDOSCOPICALLY HARVESTED RIGHT SAPEHENEOUS VEINS.;  Surgeon: [name], MD;  Location: [place];  Service: Open Heart Surgery;  Laterality: N/A;  . CYST REMOVAL HAND Right   . ESOPHAGOGASTRODUODENOSCOPY N/A 12/02/2015   Procedure: ESOPHAGOGASTRODUODENOSCOPY (EGD);  Surgeon: [name], MD;  Location: [place];  Service: Gastroenterology;  Laterality: N/A;  . REPLACEMENT ASCENDING AORTA N/A 10/29/2015   Procedure: REPLACEMENT OF ASCENDING AORTA USING 34MM X 30CM WOVEN DOUBLE VELOUR VASCULAR GRAFT.;  Surgeon: [name], MD;  Location: [place];  Service: Open Heart Surgery;  Laterality: N/A;  . TEE WITHOUT CARDIOVERSION N/A 10/29/2015   Procedure: TRANSESOPHAGEAL ECHOCARDIOGRAM (TEE);  Surgeon: [name], MD;  Location: [place];  Service: Open Heart Surgery;  Laterality: N/A;  . UVULECTOMY N/A 12/10/2015   Procedure: UVULECTOMY;  Surgeon: [name], MD;  Location: [place];  Service: ENT;  Laterality: N/A;     Past Medical History:  Diagnosis Date  . 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 Carpal tunnel syndrome 09/24/2014   Bilateral  . 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 Diabetes mellitus, type 2 ([place])   . Erectile dysfunction   . Essential hypertension   . Hyperlipidemia      Social History   Tobacco Use  Smoking Status Never Smoker  Smokeless Tobacco Never Used  Social History   Substance and Sexual Activity  Alcohol Use No     Allergies  Allergen Reactions  . Amiodarone Nausea And Vomiting    Current Outpatient Medications  Medication Sig Dispense Refill  . aspirin EC 81 MG tablet Take 81 mg by mouth daily.    . B-D ULTRAFINE III SHORT PEN 31G X 8 MM MISC USE 4 TIMES DAILY AS DIRECTED 100 each 0  . glipiZIDE (GLIPIZIDE XL) 10 MG 24 hr tablet Take 1 tablet (10 mg total) by mouth daily with breakfast. 90 tablet 0  . glucose  blood test strip Test blood sugars daily. Dx: E11.9 100 each 12  . Insulin Glargine ([name] KWIKPEN) 100 UNIT/ML SOPN Inject 0.23 mLs (23 Units total) into the skin at bedtime. 5 pen 0  . metoprolol tartrate (LOPRESSOR) 25 MG tablet TAKE 3 TABLETS BY MOUTH TWICE A DAY 180 tablet 0  . atorvastatin (LIPITOR) 80 MG tablet Take 1 tablet (80 mg total) by mouth daily. 90 tablet 3  . hydrochlorothiazide (HYDRODIURIL) 25 MG tablet Take 1 tablet (25 mg total) by mouth daily. 90 tablet 1  . metFORMIN (GLUCOPHAGE) 1000 MG tablet TAKE 1 TABLET (1,000 MG TOTAL) BY MOUTH 2 (TWO) TIMES DAILY WITH A MEAL. 180 tablet 0  . potassium chloride (K-DUR) 10 MEQ tablet Take 1 tablet (10 mEq total) by mouth daily. 90 tablet 1   No current facility-administered medications for this visit.     Review of Systems  Constitutional: Negative.   HENT: Negative.   Eyes: Negative.   Respiratory: Negative.   Cardiovascular: Negative.   Gastrointestinal: Negative.   Genitourinary: Negative.   Musculoskeletal: Negative.   Skin: Negative.   Neurological: Negative.   Endo/Heme/Allergies: Negative.   Psychiatric/Behavioral: Negative.       Physical Exam: BP 114/70 (BP Location: Left Arm, Patient Position: Sitting, Cuff Size: Large)   Pulse 76   Resp 18   Ht 5\' 10"  (1.778 m)   Wt 193 lb 6.4 oz (87.7 kg)   SpO2 98% Comment: RA  BMI 27.75 kg/m   General appearance: alert and cooperative Head: Normocephalic, without obvious abnormality, atraumatic Neck: no adenopathy, no carotid bruit, no JVD, supple, symmetrical, trachea midline and thyroid not enlarged, symmetric, no tenderness/mass/nodules Lymph nodes: Cervical, supraclavicular, and axillary nodes normal. Resp: clear to auscultation bilaterally Back: symmetric, no curvature. ROM normal. No CVA tenderness. Cardio: regular rate and rhythm, S1, S2 normal, no murmur, click, rub or gallop GI: soft, non-tender; bowel sounds normal; no masses,  no  organomegaly Extremities: extremities normal, atraumatic, no cyanosis or edema and Homans sign is negative, no sign of DVT Neurologic: Grossly normal   Diagnostic Studies & Laboratory data:     Recent Radiology Findings:   Ct Angio Chest Aorta W/cm &/or Wo/cm  Result Date: 05/01/2017 CLINICAL DATA:  Follow-up thoracic aorta following aortic valve repair and ascending aorta repair EXAM: CT ANGIOGRAPHY CHEST WITH CONTRAST TECHNIQUE: Multidetector CT imaging of the chest was performed using the standard protocol during bolus administration of intravenous contrast. Multiplanar CT image reconstructions and MIPs were obtained to evaluate the vascular anatomy. CONTRAST:  22mL ISOVUE-370 IOPAMIDOL (ISOVUE-370) INJECTION 76% COMPARISON:  04/13/2016 FINDINGS: Cardiovascular: There again noted changes consistent with prior median sternotomy as well as aortic valve repair and tube graft replacement of the ascending aorta. The aorta measures 3.4 cm at the sino-tubular junction stable from the prior exam. The ascending aorta at the level of the main pulmonary artery measures approximately 3.5 cm in  diameter stable from the prior exam. The native ascending aorta just before the origin of the truncus measures approximately 4.3 cm in greatest dimension similar to that noted on the prior exam. Aortic arch is stable in appearance. The proximal descending aorta measures 2.8 cm similar to that noted on the prior exam. The more distal descending thoracic aorta tapers in a normal fashion with scattered atherosclerotic changes. No evidence of dissection is seen. Bovine configuration of the aortic arch is again noted and stable. Native coronary calcifications are seen. The pulmonary artery as visualized is within normal limits. Mediastinum/Nodes: Thoracic inlet is within normal limits. No significant hilar or mediastinal adenopathy is noted. The esophagus is unremarkable. Lungs/Pleura: Lungs are well aerated bilaterally.  Scattered stable partially calcified and noncalcified pleural plaques are noted bilaterally. No focal infiltrate or sizable effusion is seen. No sizable parenchymal nodule is noted. Upper Abdomen: The visualized upper abdomen is stable. 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 Musculoskeletal: Degenerative changes of the thoracic spine are noted. No acute bony abnormality is seen. Review of the MIP images confirms the above findings. IMPRESSION: Status post aortic valve replacement as well as tube graft in the ascending aorta. The overall appearance is stable from the prior exam. Stable aneurysmal dilatation to 4.3 cm in the distal aspect of the native ascending aorta similar to that seen on the prior exam. Stable calcified and noncalcified pleural plaques. Stable gastric fundal diverticulum. No acute abnormality is noted. Aortic aneurysm NOS (ICD10-I71.9). Aortic Atherosclerosis (ICD10-170.0) Electronically Signed   By: Inez Catalina M.D.   On: 05/01/2017 14:49  I have independently reviewed the above radiology studies  and reviewed the findings with the patient.  Ct Angio Chest Aorta W/cm &/or Wo/cm  Result Date: 04/13/2016 CLINICAL DATA:  Follow-up thoracic aortic aneurysm. History of open heart surgery. EXAM: CT ANGIOGRAPHY CHEST WITH CONTRAST TECHNIQUE: Multidetector CT imaging of the chest was performed using the standard protocol during bolus administration of intravenous contrast. Multiplanar CT image reconstructions and MIPs were obtained to evaluate the vascular anatomy. CONTRAST:  18mL ISOVUE-300 IOPAMIDOL (ISOVUE-300) INJECTION 61% COMPARISON:  08/27/2015; 03/31/2011 FINDINGS: Vascular Findings: Post median sternotomy, CABG, open aortic valve and ascending thoracic aortic repair. The patient is undergone open aortic valve repair as well as replacement of the ascending thoracic aorta without evidence of complication, specifically, no evidence of extravasation or vessel dissection. There is grossly  unchanged aneurysmal dilatation of the residual ascending thoracic aorta as well as the proximal aspect of the aortic arch with measurements as follows. The thoracic aorta tapers to a normal caliber at the level of the mid/ distal aspect of the aortic arch. Minimal amount of slightly irregular mixed calcified and noncalcified atherosclerotic plaque within normal caliber descending thoracic aorta. Bovine configuration of the aortic arch. The branch vessels of the aortic arch appear widely patent throughout their imaged course. Borderline cardiomegaly. Calcifications within the native coronary arteries. Trace amount of pericardial fluid, presumably physiologic. Although this examination was not tailored for the evaluation the pulmonary arteries, there are no discrete filling defects within the central pulmonary arterial tree to suggest central pulmonary embolism. Normal caliber the main pulmonary artery. ------------------------------------------------------------- Thoracic aortic measurements: Sinotubular junction 34 mm as measured in greatest oblique coronal dimension. Proximal ascending aorta 36 mm as measured in greatest oblique axial dimension at the level of the main pulmonary artery. 44 mm at the level of the more cranial ascending thoracic aorta (sagittal image 79, series 602), unchanged since the 2013 examination, previously, 47 mm  Aortic arch aorta 30 mm as measured in greatest oblique sagittal dimension. Proximal descending thoracic aorta 28 mm as measured in greatest oblique axial dimension at the level of the main pulmonary artery. Distal descending thoracic aorta 23 mm as measured in greatest oblique axial dimension at the level of the diaphragmatic hiatus. Review of the MIP images confirms the above findings. ------------------------------------------------------------- Non-Vascular Findings: Mediastinum/Lymph Nodes: No bulky mediastinal, hilar axillary lymphadenopathy. Lungs/Pleura: Slightly nodular  pleuroparenchymal thickening about the anterior nondependent portion of the bilateral upper lobes with associated scattered pleural calcifications are similar to the 03/2011 examination. Punctate (approximately 0.4 cm) noncalcified subpleural nodule within the superior segment of the right lower lobe (image 49, series 4), similar to the 03/2011 examination. No new focal airspace opacities. No pleural effusion or pneumothorax. The central pulmonary airways appear widely patent. 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 Stigmata of DISH within the caudal aspect of the thoracic spine. Mild bilateral gynecomastia.  Regional soft tissues appear normal. IMPRESSION: 1. Post open repair of the ascending thoracic aorta without evidence of complication. 2. 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 3. Stable pleural calcifications, likely the sequela of prior asbestos exposure and similar to the 03/2011 examination. 4. 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 5.  Aortic Atherosclerosis (ICD10-170.0) 6.  Aortic aneurysm NOS (ICD10-I71.9) Electronically Signed   By: Sandi Mariscal M.D.   On: 04/13/2016 15:45     Recent Lab Findings: Lab Results  Component Value Date   WBC 8.8 04/24/2017   HGB 11.9 (L) 04/24/2017   HCT 35.5 (L) 04/24/2017   PLT 297.0 04/24/2017   GLUCOSE 90 04/24/2017   CHOL 178 04/24/2017   TRIG 53.0 04/24/2017   HDL 71.30 04/24/2017   LDLDIRECT 112.6 05/14/2012   LDLCALC 96 04/24/2017   ALT 14 04/24/2017   AST 17 04/24/2017   NA 140  04/24/2017   K 4.5 04/24/2017   CL 103 04/24/2017   CREATININE 0.94 04/24/2017   BUN 14 04/24/2017   CO2 30 04/24/2017   TSH 1.13 04/24/2017   INR 2.52 11/24/2015   HGBA1C 9.2 (H) 04/24/2017      Assessment / Plan:  Patient stable replaced of aortic valve and ascending aorta and coronary artery bypass grafting in September 2017, current CT scan shows stable mild dilatation of the aortic arch unchanged from CT scan 1 year ago.  Patient is without symptoms of congestive heart failure or angina. We will plan to see him back with a follow-up CTA of the chest in 18 months. The patient was again reminded about antibiotic prophylaxis around invasive procedures with his prosthetic valve.         [name] MD      [address] [place] [address] Office [phone]   Beeper [phone]  05/08/2017 8:56 PM

## 2017-05-08 NOTE — Telephone Encounter (Signed)
Sent to the pharmacy by e-scribe. 

## 2017-06-16 ENCOUNTER — Other Ambulatory Visit: Payer: Self-pay | Admitting: Adult Health

## 2017-06-19 NOTE — Telephone Encounter (Signed)
Sent to the pharmacy by e-scribe. 

## 2017-07-10 ENCOUNTER — Ambulatory Visit (INDEPENDENT_AMBULATORY_CARE_PROVIDER_SITE_OTHER): Payer: Medicare Other | Admitting: Adult Health

## 2017-07-10 ENCOUNTER — Telehealth: Payer: Self-pay

## 2017-07-10 DIAGNOSIS — E1149 Type 2 diabetes mellitus with other diabetic neurological complication: Secondary | ICD-10-CM

## 2017-07-10 DIAGNOSIS — E1165 Type 2 diabetes mellitus with hyperglycemia: Secondary | ICD-10-CM

## 2017-07-10 NOTE — Telephone Encounter (Signed)
Discussed getting patient assistance for [name] at his AWV. Spoke to [place], the representative who told me to have [name] call [phone] and they will assist him with getting his basaglar.  ( to note, this health coach had left him 2 to 3 messages which he stated he did not get due to changing cell phones.)  Given the above information and set and placed the call today.

## 2017-07-10 NOTE — Telephone Encounter (Signed)
Mr. Hubert in today and stated he no longer has any [name] left  Mr. Grape placed a call Call to [place] and spoke to a rep today He will receive a voucher tomorrow am for a box of [name] pens  He has to call [phone] to request a application to request assistance with this medicine. He verbally agreed to fup   I was unable to print the patient assistance form online.  Mr. Casaus agrees to fup with Tommi Rumps and apt scheduled for early June.  BS have running in the am 128, 130 at home. States his BS have not been elevated to the extreme.  Tommi Rumps gave him a sample today

## 2017-07-11 NOTE — Progress Notes (Signed)
Entered in error

## 2017-07-12 ENCOUNTER — Telehealth: Payer: Self-pay | Admitting: Cardiovascular Disease

## 2017-07-12 NOTE — Telephone Encounter (Signed)
Spoke with patient who complains of soreness in his chest from midsternum to left breast and that left breast is swollen. He currently takes HCTZ. He states SOB with going up stairs is similar to the way he felt before he had CABG. He c/o incision swelling that occurs with the SOB and decreases when he is not feeling the discomfort.  Complains of occasional dizziness and diaphoresis without nausea. States has not checked his BP recently. Denies use of NTG, states he does not have any.  He states BS has been well controlled with the exception of a BS >200 yesterday. He had an appointment with PCP earlier this week but it was cancelled. He requests an appointment with Dr. Acie Fredrickson. I advised that I will review his complaints with Dr. Acie Fredrickson and call back with his advice. Patient verbalized understanding and agreement.

## 2017-07-12 NOTE — Telephone Encounter (Signed)
Reviewed patient's concerns with Dr. Acie Fredrickson who advised patient may come in to be seen tomorrow at 10:00 am. I called patient and advised him of the appointment and he verbalized agreement. He denies high sodium diet and states the chest tenderness and the SOB do not occur simultaneously. I advised that we will see him in the office tomorrow and he thanked me for the call.

## 2017-07-12 NOTE — Telephone Encounter (Signed)
New Message    Pt c/o of Chest Pain: STAT if CP now or developed within 24 hours  1. Are you having CP right now?  No pain just sore.    2. Are you experiencing any other symptoms (ex. SOB, nausea, vomiting, sweating)?  Only experiences SOB on when coming up the steps   3. How long have you been experiencing CP? For about a week   4. Is your CP continuous or coming and going? continous pain   5. Have you taken Nitroglycerin? No  ?

## 2017-07-12 NOTE — Telephone Encounter (Signed)
Attempted to call patient, no answer and unable to leave message due to mail box full. 

## 2017-07-13 ENCOUNTER — Ambulatory Visit: Payer: Medicare Other | Admitting: Cardiovascular Disease

## 2017-07-13 ENCOUNTER — Encounter: Payer: Self-pay | Admitting: Cardiovascular Disease

## 2017-07-13 ENCOUNTER — Telehealth: Payer: Self-pay | Admitting: Cardiovascular Disease

## 2017-07-13 VITALS — BP 130/84 | HR 75 | Ht 70.0 in | Wt 194.0 lb

## 2017-07-13 DIAGNOSIS — R0789 Other chest pain: Secondary | ICD-10-CM

## 2017-07-13 DIAGNOSIS — I2510 Atherosclerotic heart disease of native coronary artery without angina pectoris: Secondary | ICD-10-CM

## 2017-07-13 MED ORDER — HYDROCHLOROTHIAZIDE 25 MG PO TABS: 25.0000 mg | ORAL_TABLET | Freq: Every day | ORAL | 3 refills | Status: DC

## 2017-07-13 MED ORDER — ROSUVASTATIN CALCIUM 20 MG PO TABS: 20.0000 mg | ORAL_TABLET | Freq: Every day | ORAL | 3 refills | Status: DC

## 2017-07-13 MED ORDER — POTASSIUM CHLORIDE ER 10 MEQ PO TBCR: 10.0000 meq | EXTENDED_RELEASE_TABLET | Freq: Every day | ORAL | 3 refills | Status: DC

## 2017-07-13 NOTE — Telephone Encounter (Signed)
New Message    Is the patient suppose to continue taking the fluid pills ? He doesn't know the name of them if he is to continue them please call a refill into [facility] in [place] 90 day supply

## 2017-07-13 NOTE — Telephone Encounter (Signed)
Refill of patient's HCTZ sent to [facility] per patient request

## 2017-07-13 NOTE — Progress Notes (Signed)
[name] Date of Birth  Sep 02, 1945     [address]    [address]    [place], [place]  [address]        Problems: 1. Chest pain 2. CAD - s/p CABG  - Sept. 2017  3. Aortic stenosis - s/p AVR Sept. 2017.  4. Hypercholesterolemia 5. Diabetes mellitus 6. Mild dilatation of the ascending aorta by CT scan-4.4 cm    Previous notes.:    [name] is a 72 year old gentleman who presents today for further evaluation of his chest pain. These chest pains are described as a left-sided pain. He seemed to come on with rest and exertion.  The pains last for hours.    He has been quite regularly.  He does not do any regular exercise.  He works as a [employer].  He had a stress Myoview study which was normal. His echocardiogram revealed  Left ventricle: The cavity size was normal. Wall thickness was increased in a pattern of mild LVH. Systolic function was normal. The estimated ejection fraction was in the range of 55% to 60%. 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 Mean gradient: 36m Hg (S). Peak gradient: 347mHg (S). - Atrial septum: No defect or patent foramen ovale was identified.  He still has some occasional episodes of chest pain that he describes as a "pins and needles" like sensation.  He's able to get out and exercise without any problems.  Dec. 11, 2015:  [name] a 89o who I follow for episodes of chest pain, mild aortic stenosis, mild aortic dilitation He is seen back after a 2 year absence.   He is now retired from being a [name] Now semi retired - works 4 hours a day.   No CP or dyspnea.   Walks occasionally ,  A recent echo showed mild AS and trace MR.  He has mild aortic dilitation.  July 23 2015:  [name] seen back after a 2 year absence . He's continued to have some episodes of chest pain. We performed a stress Myoview study on him in 2013 which was normal. These episodes  of pain are not associated with eating, drinking, exercise, or change of position. The pains are exacerbated by leaning forward. He's not tried any specific medications. He still works as a [employer]  He notes that the chest pain will hurt if he sweeping or mopping quite a bit.  He notes some shortness of breath with exertion when he is climbing stairs. This seems to be stable.  Dec. 7, 2017:  [name] seen back  Had CABG ( 4 vessel)  + AVR (23MM MAGNA EASE TISSUE VALVE. (N/A) on Sept.  left internal mammary to the left anterior descending coronary artery, sequential reverse SVG  to the first and second obtuse marginal, SVG to the distal right coronary artery  He has had multiple hospitalizations (5)  since that time for nausea and vomiting. Eventually his amiodarone was discontinued.  This seems to have helped   Hospitalization was complicated by trauma to the [place] - required surgical removal .   He was told by someone to stop his Xarelto and start an aspirin.  Took Xarelto for a month.   April 24, 2016:  [name] seen today .  No CP or dyspnea.  Healing up from his CABG in Sept. 2017.  Oct. 2, 2018:    [name] is seen for follow-up of his coronary artery disease, hyperlipidemia, hypertension and aortic valve placement. He still has dilatation of his ascending aorta.  has some chest soreness at times , when he picks up something a bit heavy .  Not angina ,   A soreness.  Getting some regular exercise  BP is slightly elevated  Still eating some salty foods , canned green beans  No syncope or presyncope. He denies any chest pain or shortness of breath.  Jul 13, 2017: Seen for follow up of his CAD and AVR in Sept 2017.    Has had some chest wall tenderness.   Has been present for several months   Also has some left breast swelling that has been present for several weeks.   Associated with itching .   Has not discussed with primrary MD    Breathing has been good .   Some DOE with climbing steps .  Has not been exercising      Current Outpatient Medications on File Prior to Visit  Medication Sig Dispense Refill  . aspirin EC 81 MG tablet Take 81 mg by mouth daily.    [name] Kitchen atorvastatin (LIPITOR) 80 MG tablet Take 80 mg by mouth daily.    . B-D ULTRAFINE III SHORT PEN 31G X 8 MM MISC USE 4 TIMES DAILY AS DIRECTED 100 each 0  . glipiZIDE (GLIPIZIDE XL) 10 MG 24 hr tablet Take 1 tablet (10 mg total) by mouth daily with breakfast. 90 tablet 0  . glucose blood test strip Test blood sugars daily. Dx: E11.9 100 each 12  . hydrochlorothiazide (HYDRODIURIL) 25 MG tablet Take 25 mg by mouth daily.    . Insulin Glargine ([name] KWIKPEN) 100 UNIT/ML SOPN Inject 0.23 mLs (23 Units total) into the skin at bedtime. 5 pen 0  . metFORMIN (GLUCOPHAGE) 1000 MG tablet TAKE 1 TABLET (1,000 MG TOTAL) BY MOUTH 2 (TWO) TIMES DAILY WITH A MEAL. 180 tablet 0  . metoprolol tartrate (LOPRESSOR) 25 MG tablet TAKE 3 TABLETS BY MOUTH TWICE A DAY 540 tablet 2  . potassium chloride (K-DUR) 10 MEQ tablet Take 10 mEq by mouth daily.     No current facility-administered medications on file prior to visit.     Allergies  Allergen Reactions  . Amiodarone Nausea And Vomiting    Past Medical History:  Diagnosis Date  . 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 Carpal tunnel syndrome 09/24/2014   Bilateral  . 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 Diabetes mellitus, type 2 ([place])   . Erectile dysfunction   . Essential hypertension   . Hyperlipidemia     Past Surgical History:  Procedure Laterality Date  . AORTIC VALVE REPLACEMENT N/A 10/29/2015   Procedure: AORTIC VALVE REPLACEMENT (AVR) WITH 23MM MAGNA EASE TISSUE VALVE.;  Surgeon: [name], MD;  Location: [facility];  Service: Open Heart Surgery;  Laterality: N/A;  . CARDIAC CATHETERIZATION N/A 10/27/2015   Procedure: Left Heart Cath  and Coronary Angiography;  Surgeon: [name], MD;  Location: [facility];  Service: Cardiovascular;  Laterality: N/A;  . CORONARY ARTERY BYPASS GRAFT N/A 10/29/2015   Procedure: CORONARY ARTERY BYPASS GRAFTING (CABG) x Four UTILIZING THE LEFT INTERNAL MAMMARY ARTERY AND ENDOSCOPICALLY HARVESTED RIGHT SAPEHENEOUS VEINS.;  Surgeon: [name], MD;  Location: [facility];  Service: Open Heart Surgery;  Laterality: N/A;  .  CYST REMOVAL HAND Right   . ESOPHAGOGASTRODUODENOSCOPY N/A 12/02/2015   Procedure: ESOPHAGOGASTRODUODENOSCOPY (EGD);  Surgeon: [name], MD;  Location: [facility];  Service: Gastroenterology;  Laterality: N/A;  . REPLACEMENT ASCENDING AORTA N/A 10/29/2015   Procedure: REPLACEMENT OF ASCENDING AORTA USING 34MM X 30CM WOVEN DOUBLE VELOUR VASCULAR GRAFT.;  Surgeon: [name], MD;  Location: [facility];  Service: Open Heart Surgery;  Laterality: N/A;  . TEE WITHOUT CARDIOVERSION N/A 10/29/2015   Procedure: TRANSESOPHAGEAL ECHOCARDIOGRAM (TEE);  Surgeon: [name], MD;  Location: [facility];  Service: Open Heart Surgery;  Laterality: N/A;  . UVULECTOMY N/A 12/10/2015   Procedure: UVULECTOMY;  Surgeon: [name], MD;  [facility];  Service: ENT;  Laterality: N/A;    Social History   Tobacco Use  Smoking Status Never Smoker  Smokeless Tobacco Never Used    Social History   Substance and Sexual Activity  Alcohol Use No    Family History  Problem Relation Age of Onset  . Cancer Father        Throat cancer  . Diabetes Sister     Physical Exam: Blood pressure 130/84, pulse 75, height 5\' 10"  (1.778 m), weight 194 lb (88 kg), SpO2 99 %.  GEN: Elderly gentleman, no acute distress HEENT: Normal NECK: No JVD; No carotid bruits LYMPHATICS: No lymphadenopathy CARDIAC: Regular rate, S1-S2.  He is very tender along his sternum and along the ribs one left side of his chest. Left breast appears to be normal size, no tender .  RESPIRATORY:   Clear to auscultation without rales, wheezing or rhonchi  ABDOMEN: Soft, non-tender, non-distended MUSCULOSKELETAL:  No edema; No deformity  SKIN: Warm and dry NEUROLOGIC:  Alert and oriented x 3    ECG: Jul 13, 2017: Normal sinus rhythm at 75 beats minute.  Nonspecific T wave changes in leads I and aVL. Assessment / Plan:   1. Bicupsid AV:   Status post aortic valve replacement-seems to be doing very well.  He is having lots of chest wall tenderness.  I suspect that this is costochondritis.  I do not think that he has sternal nonunion.  We will have him try Motrin 400 mg 3 times a day for the next week or 2.  Pain continues to have advised him to ask Dr. Servando Snare for further advice.  2.  Ascending aortic aneurism:   Followed by Dr. Servando Snare. He'll be seeing Dr. Servando Snare soon.   3. CAD -  No angina   4. Paroxysmal atrial fib:   maintainine NSR   5. Hyperlipidemia:   Last LDL was 96.  This is on atorvastatin 80 mg a day.  His goal LDL is 70.  Will discontinue the atorvastatin and start him on rosuvastatin 20 mg a day.  6. HTN:   BP is well controlled.   Mertie Moores, MD  07/13/2017 11:18 AM    [address] [place],  [place],   [address] Pager [phone] Phone: [phone]; Fax: [phone]

## 2017-07-13 NOTE — Patient Instructions (Signed)
Medication Instructions:  Your physician has recommended you make the following change in your medication:   STOP Atorvastatin (Lipitor) START Rosuvastatin (Crestor) 20 mg once daily TAKE Motrin (Ibuprofen) 400 mg 3 times per day for 1-2 weeks to see if this helps with your chest tenderness   Labwork: Your physician recommends that you return for lab work in: 3 months  You will need to FAST for this appointment - nothing to eat or drink after midnight the night before except water.   Testing/Procedures: None Ordered   Follow-Up: Your physician wants you to follow-up in: 1 year with Dr. Acie Fredrickson. You will receive a reminder letter in the mail two months in advance. If you don't receive a letter, please call our office to schedule the follow-up appointment.   If you need a refill on your cardiac medications before your next appointment, please call your pharmacy.   Thank you for choosing [facility]! Christen Bame, RN [phone]

## 2017-07-14 ENCOUNTER — Other Ambulatory Visit: Payer: Self-pay | Admitting: Adult Health

## 2017-07-14 DIAGNOSIS — E1165 Type 2 diabetes mellitus with hyperglycemia: Principal | ICD-10-CM

## 2017-07-14 DIAGNOSIS — IMO0002 Reserved for concepts with insufficient information to code with codable children: Secondary | ICD-10-CM

## 2017-07-14 DIAGNOSIS — E1140 Type 2 diabetes mellitus with diabetic neuropathy, unspecified: Secondary | ICD-10-CM

## 2017-07-14 DIAGNOSIS — Z794 Long term (current) use of insulin: Principal | ICD-10-CM

## 2017-07-17 NOTE — Telephone Encounter (Signed)
Sent to the pharmacy by e-scribe. 

## 2017-07-26 ENCOUNTER — Ambulatory Visit (INDEPENDENT_AMBULATORY_CARE_PROVIDER_SITE_OTHER): Payer: Medicare Other | Admitting: Adult Health

## 2017-07-26 ENCOUNTER — Encounter: Payer: Self-pay | Admitting: Adult Health

## 2017-07-26 VITALS — BP 128/68 | Temp 97.9°F | Wt 191.0 lb

## 2017-07-26 DIAGNOSIS — E1149 Type 2 diabetes mellitus with other diabetic neurological complication: Secondary | ICD-10-CM | POA: Diagnosis not present

## 2017-07-26 DIAGNOSIS — IMO0002 Reserved for concepts with insufficient information to code with codable children: Secondary | ICD-10-CM

## 2017-07-26 DIAGNOSIS — E1165 Type 2 diabetes mellitus with hyperglycemia: Secondary | ICD-10-CM | POA: Diagnosis not present

## 2017-07-26 LAB — POCT GLYCOSYLATED HEMOGLOBIN (HGB A1C): HEMOGLOBIN A1C: 7.6 % — AB (ref 4.0–5.6)

## 2017-07-26 NOTE — Progress Notes (Signed)
Subjective:    Patient ID: [name], male    DOB: 1945/11/08, 72 y.o.   MRN: [id number]  HPI  72 year old male who  has a past medical history of Aortic stenosis, Carpal tunnel syndrome (09/24/2014), Coronary artery disease, Diabetes mellitus, type 2 ([place]), Erectile dysfunction, Essential hypertension, and Hyperlipidemia.   He presents to the office today for follow up regarding diabetes mellitus. His last A1c was 9.2 in 04/2017. He is currently prescribed metformin 1000 mg BID, Glipzide XR 10 mg, and [name] 23 units QHS. He reports that he is taking his medication daily. He has been watching his diet but has not been exercising ( his wife is ill with an unknown medical problem).   Overall he feels well and has no acute complaints.   Lab Results  Component Value Date   HGBA1C 9.2 (H) 04/24/2017   Wt Readings from Last 3 Encounters:  07/26/17 191 lb (86.6 kg)  07/13/17 194 lb (88 kg)  05/08/17 193 lb 6.4 oz (87.7 kg)    Review of Systems See HPI   Past Medical History:  Diagnosis Date  . 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 Carpal tunnel syndrome 09/24/2014   Bilateral  . 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 Diabetes mellitus, type 2 ([place])   . Erectile dysfunction   . Essential hypertension   . Hyperlipidemia     Social History   Socioeconomic History  . Marital status: Married    Spouse name: Not on file  . Number of children: Not on file  . Years of education: Not on file  . Highest education level: Not on file  Occupational History  . Not on file  Social Needs  . Financial resource strain: Not on file  . Food insecurity:    Worry: Not on file    Inability: Not on file  . Transportation needs:    Medical: Not on file    Non-medical: Not on file  Tobacco Use  . Smoking status: Never Smoker  . Smokeless tobacco: Never Used  Substance and Sexual Activity  . Alcohol use: No  . Drug  use: No  . Sexual activity: Yes  Lifestyle  . Physical activity:    Days per week: Not on file    Minutes per session: Not on file  . Stress: Not on file  Relationships  . Social connections:    Talks on phone: Not on file    Gets together: Not on file    Attends religious service: Not on file    Active member of club or organization: Not on file    Attends meetings of clubs or organizations: Not on file    Relationship status: Not on file  . Intimate partner violence:    Fear of current or ex partner: Not on file    Emotionally abused: Not on file    Physically abused: Not on file    Forced sexual activity: Not on file  Other Topics Concern  . Not on file  Social History Narrative   Married to [name] for 18 years   Retired as Chiropodist   5 children   Never Smoked   Alcohol use- no   Caffeine use: occasional       Lost one child a few months ago    Lost his sister, then son and then nephew - all within 2 weeks  apart   Son had a stroke at 37    Was planning to get married last month in Feb.     Past Surgical History:  Procedure Laterality Date  . AORTIC VALVE REPLACEMENT N/A 10/29/2015   Procedure: AORTIC VALVE REPLACEMENT (AVR) WITH 23MM MAGNA EASE TISSUE VALVE.;  Surgeon: [name], MD;  Location: [facility];  Service: Open Heart Surgery;  Laterality: N/A;  . CARDIAC CATHETERIZATION N/A 10/27/2015   Procedure: Left Heart Cath and Coronary Angiography;  Surgeon: [name], MD;  Location: [facility];  Service: Cardiovascular;  Laterality: N/A;  . CORONARY ARTERY BYPASS GRAFT N/A 10/29/2015   Procedure: CORONARY ARTERY BYPASS GRAFTING (CABG) x Four UTILIZING THE LEFT INTERNAL MAMMARY ARTERY AND ENDOSCOPICALLY HARVESTED RIGHT SAPEHENEOUS VEINS.;  Surgeon: [name], MD;  Location: [facility];  Service: Open Heart Surgery;  Laterality: N/A;  . CYST REMOVAL HAND Right   . ESOPHAGOGASTRODUODENOSCOPY N/A 12/02/2015   Procedure:  ESOPHAGOGASTRODUODENOSCOPY (EGD);  Surgeon: [name], MD;  Location: [facility];  Service: Gastroenterology;  Laterality: N/A;  . REPLACEMENT ASCENDING AORTA N/A 10/29/2015   Procedure: REPLACEMENT OF ASCENDING AORTA USING 34MM X 30CM WOVEN DOUBLE VELOUR VASCULAR GRAFT.;  Surgeon: [name], MD;  Location: [facility];  Service: Open Heart Surgery;  Laterality: N/A;  . TEE WITHOUT CARDIOVERSION N/A 10/29/2015   Procedure: TRANSESOPHAGEAL ECHOCARDIOGRAM (TEE);  Surgeon: [name], MD;  Location: [facility];  Service: Open Heart Surgery;  Laterality: N/A;  . UVULECTOMY N/A 12/10/2015   Procedure: UVULECTOMY;  Surgeon: [name], MD;  Location: [facility];  Service: ENT;  Laterality: N/A;    Family History  Problem Relation Age of Onset  . Cancer Father        Throat cancer  . Diabetes Sister     Allergies  Allergen Reactions  . Amiodarone Nausea And Vomiting    Current Outpatient Medications on File Prior to Visit  Medication Sig Dispense Refill  . aspirin EC 81 MG tablet Take 81 mg by mouth daily.    . B-D ULTRAFINE III SHORT PEN 31G X 8 MM MISC USE 4 TIMES DAILY AS DIRECTED 100 each 0  . glipiZIDE (GLIPIZIDE XL) 10 MG 24 hr tablet Take 1 tablet (10 mg total) by mouth daily with breakfast. 90 tablet 0  . glucose blood test strip Test blood sugars daily. Dx: E11.9 100 each 12  . hydrochlorothiazide (HYDRODIURIL) 25 MG tablet Take 1 tablet (25 mg total) by mouth daily. 90 tablet 3  . Insulin Glargine ([name] KWIKPEN) 100 UNIT/ML SOPN INJECT 0.23 MLS (23 UNITS TOTAL) INTO THE SKIN AT BEDTIME. 5 pen 1  . metFORMIN (GLUCOPHAGE) 1000 MG tablet TAKE 1 TABLET (1,000 MG TOTAL) BY MOUTH 2 (TWO) TIMES DAILY WITH A MEAL. 180 tablet 0  . metoprolol tartrate (LOPRESSOR) 25 MG tablet TAKE 3 TABLETS BY MOUTH TWICE A DAY 540 tablet 2  . potassium chloride (K-DUR) 10 MEQ tablet Take 1 tablet (10 mEq total) by mouth daily. 90 tablet 3  . rosuvastatin (CRESTOR) 20 MG tablet Take 1  tablet (20 mg total) by mouth daily. 90 tablet 3   No current facility-administered medications on file prior to visit.     BP 128/68   Temp 97.9 F (36.6 C) (Oral)   Wt 191 lb (86.6 kg)   BMI 27.41 kg/m       Objective:   Physical Exam  Constitutional: He appears well-developed and well-nourished. No distress.  Eyes: Pupils  are equal, round, and reactive to light. EOM are normal.  Cardiovascular: Normal rate, regular rhythm, normal heart sounds and intact distal pulses. Exam reveals no gallop and no friction rub.  No murmur heard. Pulmonary/Chest: Effort normal and breath sounds normal. No stridor. No respiratory distress. He has no wheezes. He has no rales. He exhibits no tenderness.  Musculoskeletal: Normal range of motion.  Neurological: He is alert.  Skin: Skin is warm and dry. He is not diaphoretic.  Psychiatric: He has a normal mood and affect. His behavior is normal. Judgment and thought content normal.  Nursing note and vitals reviewed.     Assessment & Plan:  1. Type II diabetes mellitus with neurological manifestations, uncontrolled (HCC) - POCT A1C- 7.6 - has improved from 9.2  - Congratulated on the improvement  - Continue to work on heart healthy diet  - I would like him to get some form of exercise in daily  - Follow up in three months or sooner if needed  [name], NP

## 2017-08-05 ENCOUNTER — Other Ambulatory Visit: Payer: Self-pay | Admitting: Adult Health

## 2017-08-06 NOTE — Telephone Encounter (Signed)
Sent to the pharmacy by e-scribe. 

## 2017-08-09 ENCOUNTER — Ambulatory Visit: Payer: Self-pay | Admitting: Cardiovascular Disease

## 2017-08-09 ENCOUNTER — Other Ambulatory Visit: Payer: Self-pay | Admitting: Adult Health

## 2017-08-09 DIAGNOSIS — Z794 Long term (current) use of insulin: Principal | ICD-10-CM

## 2017-08-09 DIAGNOSIS — Z760 Encounter for issue of repeat prescription: Secondary | ICD-10-CM

## 2017-08-09 DIAGNOSIS — IMO0002 Reserved for concepts with insufficient information to code with codable children: Secondary | ICD-10-CM

## 2017-08-09 DIAGNOSIS — E1165 Type 2 diabetes mellitus with hyperglycemia: Principal | ICD-10-CM

## 2017-08-09 DIAGNOSIS — E1140 Type 2 diabetes mellitus with diabetic neuropathy, unspecified: Secondary | ICD-10-CM

## 2017-08-13 NOTE — Telephone Encounter (Signed)
Patient calling to check the status of this refill. Please advise.

## 2017-08-14 NOTE — Telephone Encounter (Signed)
Sent to the pharmacy by e-scribe. 

## 2017-08-31 ENCOUNTER — Other Ambulatory Visit: Payer: Self-pay | Admitting: Adult Health

## 2017-08-31 DIAGNOSIS — E1165 Type 2 diabetes mellitus with hyperglycemia: Principal | ICD-10-CM

## 2017-08-31 DIAGNOSIS — IMO0002 Reserved for concepts with insufficient information to code with codable children: Secondary | ICD-10-CM

## 2017-08-31 DIAGNOSIS — Z794 Long term (current) use of insulin: Principal | ICD-10-CM

## 2017-08-31 DIAGNOSIS — E1140 Type 2 diabetes mellitus with diabetic neuropathy, unspecified: Secondary | ICD-10-CM

## 2017-08-31 NOTE — Telephone Encounter (Signed)
Sent to the pharmacy by e-scribe. 

## 2017-09-20 ENCOUNTER — Other Ambulatory Visit: Payer: Medicare Other | Admitting: *Deleted

## 2017-09-20 DIAGNOSIS — I2510 Atherosclerotic heart disease of native coronary artery without angina pectoris: Secondary | ICD-10-CM

## 2017-09-20 DIAGNOSIS — R0789 Other chest pain: Secondary | ICD-10-CM

## 2017-09-20 LAB — LIPID PANEL
CHOL/HDL RATIO: 2 ratio (ref 0.0–5.0)
Cholesterol, Total: 187 mg/dL (ref 100–199)
HDL: 95 mg/dL (ref >39)
LDL Calculated: 77 mg/dL (ref 0–99)
Triglycerides: 75 mg/dL (ref 0–149)
VLDL CHOLESTEROL CAL: 15 mg/dL (ref 5–40)

## 2017-09-20 LAB — BASIC METABOLIC PANEL
BUN / CREAT RATIO: 13 (ref 10–24)
BUN: 15 mg/dL (ref 8–27)
CHLORIDE: 102 mmol/L (ref 96–106)
CO2: 23 mmol/L (ref 20–29)
Calcium: 9.7 mg/dL (ref 8.6–10.2)
Creatinine, Ser: 1.15 mg/dL (ref 0.76–1.27)
GFR calc Af Amer: 74 mL/min/{1.73_m2} (ref >59)
GFR calc non Af Amer: 64 mL/min/{1.73_m2} (ref >59)
GLUCOSE: 109 mg/dL — AB (ref 65–99)
POTASSIUM: 4.2 mmol/L (ref 3.5–5.2)
SODIUM: 141 mmol/L (ref 134–144)

## 2017-09-20 LAB — HEPATIC FUNCTION PANEL
ALK PHOS: 72 IU/L (ref 39–117)
ALT: 19 IU/L (ref 0–44)
AST: 21 IU/L (ref 0–40)
Albumin: 4.5 g/dL (ref 3.5–4.8)
Bilirubin Total: 0.3 mg/dL (ref 0.0–1.2)
Bilirubin, Direct: 0.1 mg/dL (ref 0.00–0.40)
TOTAL PROTEIN: 7.1 g/dL (ref 6.0–8.5)

## 2017-09-21 ENCOUNTER — Telehealth: Payer: Self-pay | Admitting: Cardiovascular Disease

## 2017-09-21 NOTE — Telephone Encounter (Signed)
Reviewed lab results and plan to continue current medications with patient who verbalized undertstanding

## 2017-09-21 NOTE — Telephone Encounter (Signed)
New Message: ° ° ° ° ° °Pt is returning a call for lab results. °

## 2017-10-26 ENCOUNTER — Ambulatory Visit: Payer: Medicare Other | Admitting: Adult Health

## 2017-11-06 LAB — HM DIABETES EYE EXAM

## 2017-11-09 ENCOUNTER — Encounter: Payer: Self-pay | Admitting: Adult Health

## 2018-01-02 ENCOUNTER — Other Ambulatory Visit: Payer: Self-pay | Admitting: Adult Health

## 2018-01-02 NOTE — Telephone Encounter (Signed)
Sent to the pharmacy by e-scribe for 30 days.  I contacted the pt to set him up for a return visit for his diabetes.  He will come in on 01-16-18 to see [facility].

## 2018-01-16 ENCOUNTER — Ambulatory Visit: Payer: Medicare Other | Admitting: Adult Health

## 2018-01-16 ENCOUNTER — Encounter: Payer: Self-pay | Admitting: Adult Health

## 2018-01-16 VITALS — BP 130/80 | Temp 98.6°F | Wt 201.0 lb

## 2018-01-16 DIAGNOSIS — E1165 Type 2 diabetes mellitus with hyperglycemia: Secondary | ICD-10-CM | POA: Diagnosis not present

## 2018-01-16 DIAGNOSIS — E1149 Type 2 diabetes mellitus with other diabetic neurological complication: Secondary | ICD-10-CM

## 2018-01-16 DIAGNOSIS — Z23 Encounter for immunization: Secondary | ICD-10-CM

## 2018-01-16 DIAGNOSIS — IMO0002 Reserved for concepts with insufficient information to code with codable children: Secondary | ICD-10-CM

## 2018-01-16 LAB — POCT GLYCOSYLATED HEMOGLOBIN (HGB A1C): HBA1C, POC (CONTROLLED DIABETIC RANGE): 8.4 % — AB (ref 0.0–7.0)

## 2018-01-16 NOTE — Patient Instructions (Addendum)
It was great seeing you today   As we discussed your weight is up 10 pounds over the last 6 months. This has caused your A1c to go from 7.6 to 8.4   I am going to to go up on your Lantus from 23 units to 25 units.   Please follow up for your physical in March

## 2018-01-16 NOTE — Progress Notes (Signed)
Subjective:    Patient ID: [name], male    DOB: 05/06/1945, 72 y.o.   MRN: [id number]  HPI   72 year old male who  has a past medical history of Aortic stenosis, Carpal tunnel syndrome (09/24/2014), Coronary artery disease, Diabetes mellitus, type 2 ([place]), Erectile dysfunction, Essential hypertension, and Hyperlipidemia.   Resents to the office today for follow-up regarding diabetes.  He is currently maintained on metformin 1000 mg twice daily, glipizide extended release 10 mg, and [name] 23 units nightly.  Does take his medications on a daily basis.  His last A1c was 7.6 this was down from 9.2 in March 2019.  He reports that his blood sugars have been " ok", in the 130-150's. Denies BS above 200.   He denies any feelings of hypoglycemia.   His wife is ill and he has found it hard to work on exercise and eat a heart healthy diet   Wt Readings from Last 3 Encounters:  01/16/18 201 lb (91.2 kg)  07/26/17 191 lb (86.6 kg)  07/13/17 194 lb (88 kg)    Review of Systems See HPI   Past Medical History:  Diagnosis Date  . 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 Carpal tunnel syndrome 09/24/2014   Bilateral  . 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 Diabetes mellitus, type 2 ([place])   . Erectile dysfunction   . Essential hypertension   . Hyperlipidemia     Social History   Socioeconomic History  . Marital status: Married    Spouse name: Not on file  . Number of children: Not on file  . Years of education: Not on file  . Highest education level: Not on file  Occupational History  . Not on file  Social Needs  . Financial resource strain: Not on file  . Food insecurity:    Worry: Not on file    Inability: Not on file  . Transportation needs:    Medical: Not on file    Non-medical: Not on file  Tobacco Use  . Smoking status: Never Smoker  . Smokeless tobacco: Never Used  Substance and Sexual Activity  .  Alcohol use: No  . Drug use: No  . Sexual activity: Yes  Lifestyle  . Physical activity:    Days per week: Not on file    Minutes per session: Not on file  . Stress: Not on file  Relationships  . Social connections:    Talks on phone: Not on file    Gets together: Not on file    Attends religious service: Not on file    Active member of club or organization: Not on file    Attends meetings of clubs or organizations: Not on file    Relationship status: Not on file  . Intimate partner violence:    Fear of current or ex partner: Not on file    Emotionally abused: Not on file    Physically abused: Not on file    Forced sexual activity: Not on file  Other Topics Concern  . Not on file  Social History Narrative   Married to [name] for 66 years   Retired as Chiropodist   5 children   Never Smoked   Alcohol use- no   Caffeine use: occasional       Lost one child a few months ago    Lost his sister, then  son and then nephew - all within 2 weeks apart   Son had a stroke at 75    Was planning to get married last month in Feb.     Past Surgical History:  Procedure Laterality Date  . AORTIC VALVE REPLACEMENT N/A 10/29/2015   Procedure: AORTIC VALVE REPLACEMENT (AVR) WITH 23MM MAGNA EASE TISSUE VALVE.;  Surgeon: [name], MD;  Location: [facility];  Service: Open Heart Surgery;  Laterality: N/A;  . CARDIAC CATHETERIZATION N/A 10/27/2015   Procedure: Left Heart Cath and Coronary Angiography;  Surgeon: [name], MD;  Location: [place] [facility];  Service: Cardiovascular;  Laterality: N/A;  . CORONARY ARTERY BYPASS GRAFT N/A 10/29/2015   Procedure: CORONARY ARTERY BYPASS GRAFTING (CABG) x Four UTILIZING THE LEFT INTERNAL MAMMARY ARTERY AND ENDOSCOPICALLY HARVESTED RIGHT SAPEHENEOUS VEINS.;  Surgeon: [name], MD;  Location: [place];  Service: Open Heart Surgery;  Laterality: N/A;  . CYST REMOVAL HAND Right   . ESOPHAGOGASTRODUODENOSCOPY N/A 12/02/2015    Procedure: ESOPHAGOGASTRODUODENOSCOPY (EGD);  Surgeon: [name], MD;  Location: [facility];  Service: Gastroenterology;  Laterality: N/A;  . REPLACEMENT ASCENDING AORTA N/A 10/29/2015   Procedure: REPLACEMENT OF ASCENDING AORTA USING 34MM X 30CM WOVEN DOUBLE VELOUR VASCULAR GRAFT.;  Surgeon: [name], MD;  Location: [facility];  Service: Open Heart Surgery;  Laterality: N/A;  . TEE WITHOUT CARDIOVERSION N/A 10/29/2015   Procedure: TRANSESOPHAGEAL ECHOCARDIOGRAM (TEE);  Surgeon: [name], MD;  Location: [facility];  Service: Open Heart Surgery;  Laterality: N/A;  . UVULECTOMY N/A 12/10/2015   Procedure: UVULECTOMY;  Surgeon: [name], MD;  [facility];  Service: ENT;  Laterality: N/A;    Family History  Problem Relation Age of Onset  . Cancer Father        Throat cancer  . Diabetes Sister     Allergies  Allergen Reactions  . Amiodarone Nausea And Vomiting    Current Outpatient Medications on File Prior to Visit  Medication Sig Dispense Refill  . aspirin EC 81 MG tablet Take 81 mg by mouth daily.    . B-D ULTRAFINE III SHORT PEN 31G X 8 MM MISC USE 4 TIMES DAILY AS DIRECTED 100 each 0  . glipiZIDE (GLUCOTROL XL) 10 MG 24 hr tablet TAKE 1 TABLET (10 MG TOTAL) BY MOUTH DAILY WITH BREAKFAST. 30 tablet 0  . hydrochlorothiazide (HYDRODIURIL) 25 MG tablet Take 1 tablet (25 mg total) by mouth daily. 90 tablet 3  . Insulin Glargine ([name] KWIKPEN) 100 UNIT/ML SOPN INJECT 0.23 MLS (23 UNITS TOTAL) INTO THE SKIN AT BEDTIME. 5 pen 1  . Insulin Glargine ([name] KWIKPEN) 100 UNIT/ML SOPN INJECT 23 UNITS TOTAL INTO THE SKIN AT BEDTIME. 15 mL 0  . metFORMIN (GLUCOPHAGE) 1000 MG tablet TAKE 1 TABLET (1,000 MG TOTAL) BY MOUTH 2 (TWO) TIMES DAILY WITH A MEAL. 60 tablet 0  . metoprolol tartrate (LOPRESSOR) 25 MG tablet TAKE 3 TABLETS BY MOUTH TWICE A DAY 540 tablet 2  . ONETOUCH VERIO test strip TEST BLOOD SUGARS DAILY. DX: E11.9 100 each 3  . potassium chloride (K-DUR)  10 MEQ tablet Take 1 tablet (10 mEq total) by mouth daily. 90 tablet 3  . rosuvastatin (CRESTOR) 20 MG tablet Take 1 tablet (20 mg total) by mouth daily. 90 tablet 3   No current facility-administered medications on file prior to visit.     BP 130/80   Temp 98.6 F (37 C)   Wt 201 lb (91.2  kg)   BMI 28.84 kg/m       Objective:   Physical Exam  Constitutional: He appears well-developed and well-nourished. No distress.  Cardiovascular: Normal rate, regular rhythm, normal heart sounds and intact distal pulses.  Pulmonary/Chest: Effort normal and breath sounds normal.  Neurological: He is alert.  Skin: Skin is warm and dry. Capillary refill takes less than 2 seconds. He is not diaphoretic.  Psychiatric: He has a normal mood and affect. His behavior is normal. Judgment and thought content normal.  Nursing note and vitals reviewed.     Assessment & Plan:  1. Type II diabetes mellitus with neurological manifestations, uncontrolled (HCC) - Weight is up 10 pounds  - POCT A1C - 8.4  - Spoke at length about lifestyle modifications  - Increase Lantus to 25 units  - needs to work on lifestyle modifications    [name], NP

## 2018-01-16 NOTE — Addendum Note (Signed)
Addended by: Miles Costain T on: 01/16/2018 03:15 PM   Modules accepted: Orders

## 2018-02-03 ENCOUNTER — Other Ambulatory Visit: Payer: Self-pay | Admitting: Adult Health

## 2018-02-04 NOTE — Telephone Encounter (Signed)
Sent to the pharmacy by e-scribe.  Pt due for cpx in March 2020.

## 2018-02-10 IMAGING — DX DG CHEST 2V
2 series · 2 of 2 positions shown · non-contrast
Comparison: 04/03/2014

CLINICAL DATA: Preop chest exam for CABG. No current chest
complaints. History of recent cardiac catheterization.

EXAM:
CHEST  2 VIEW

[chest lat]
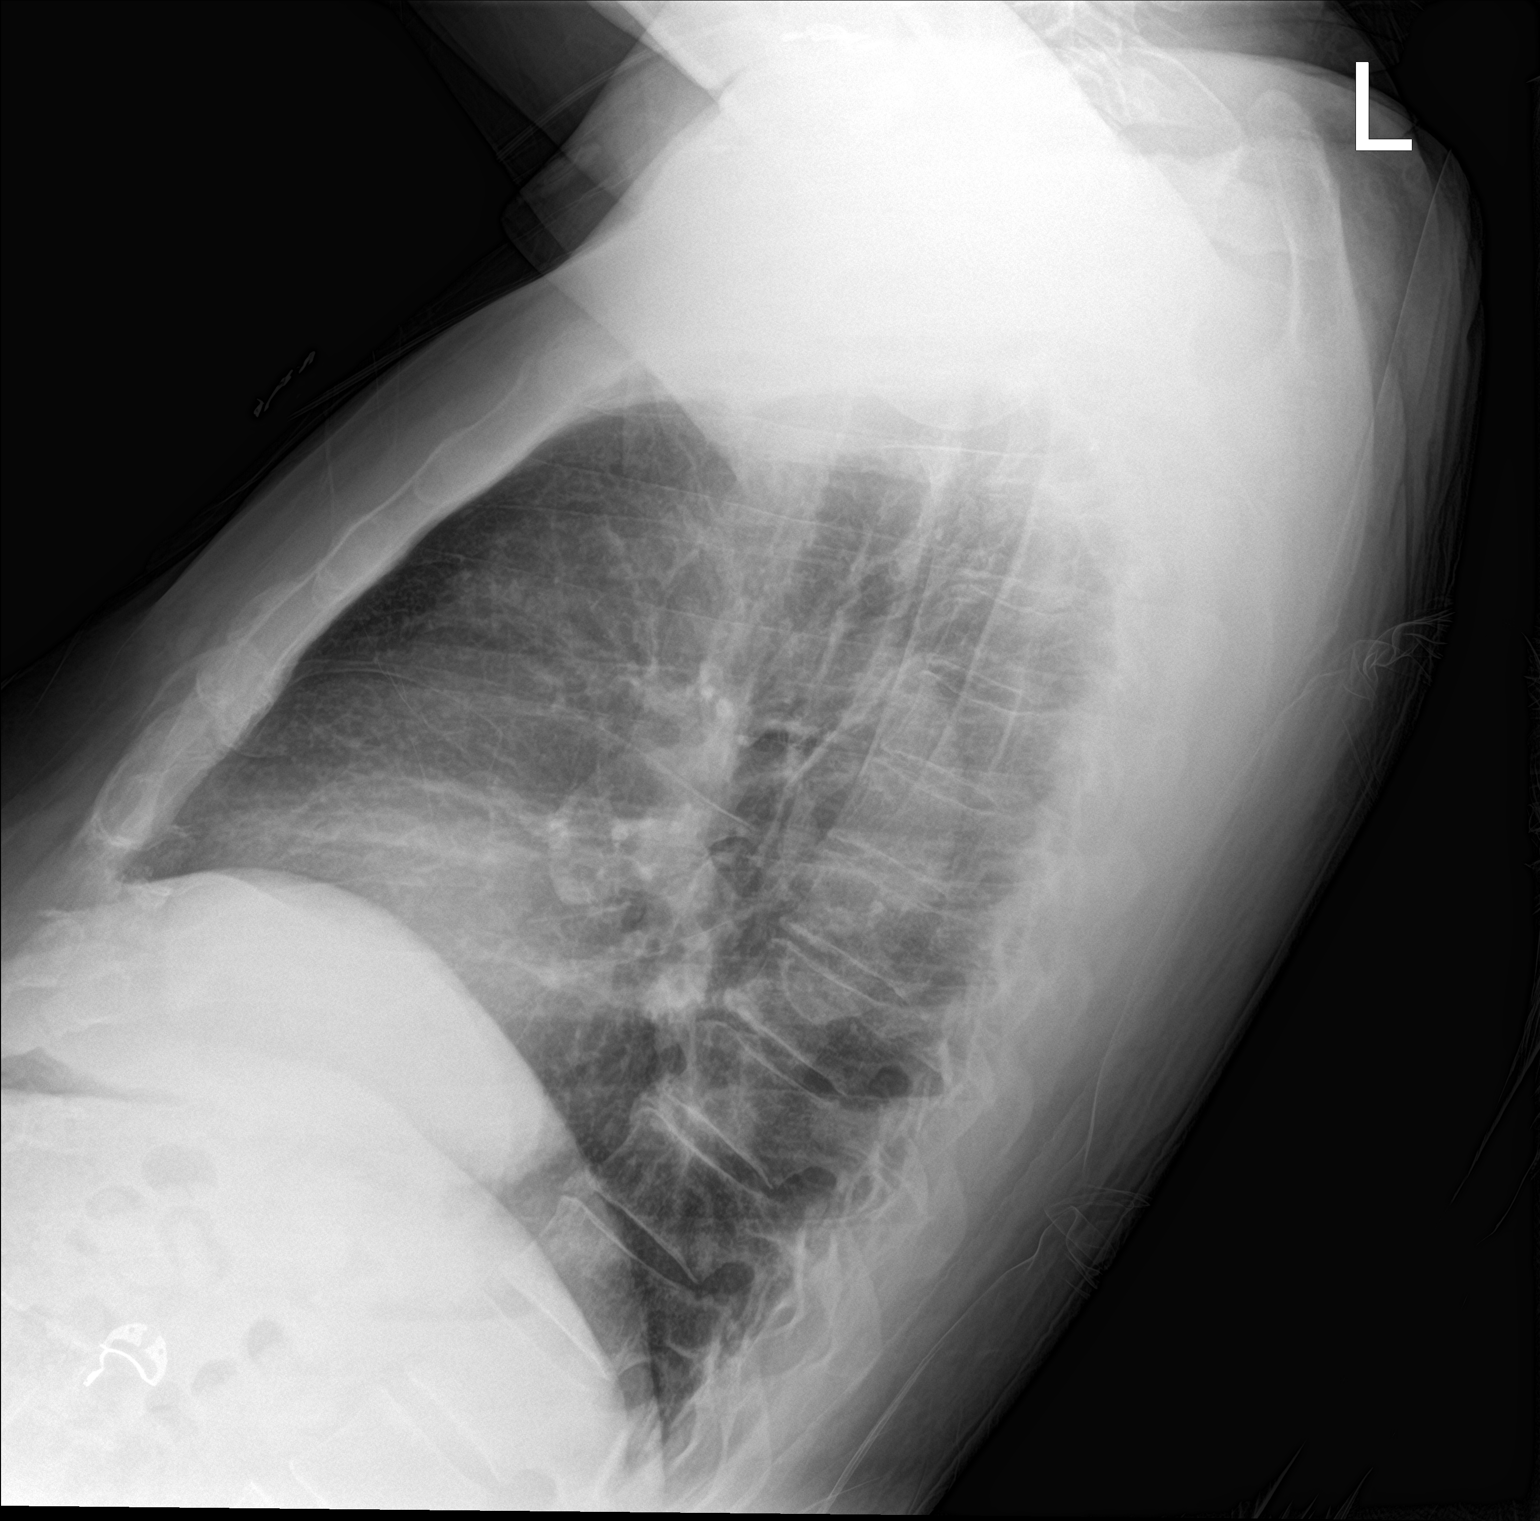

[chest ap]
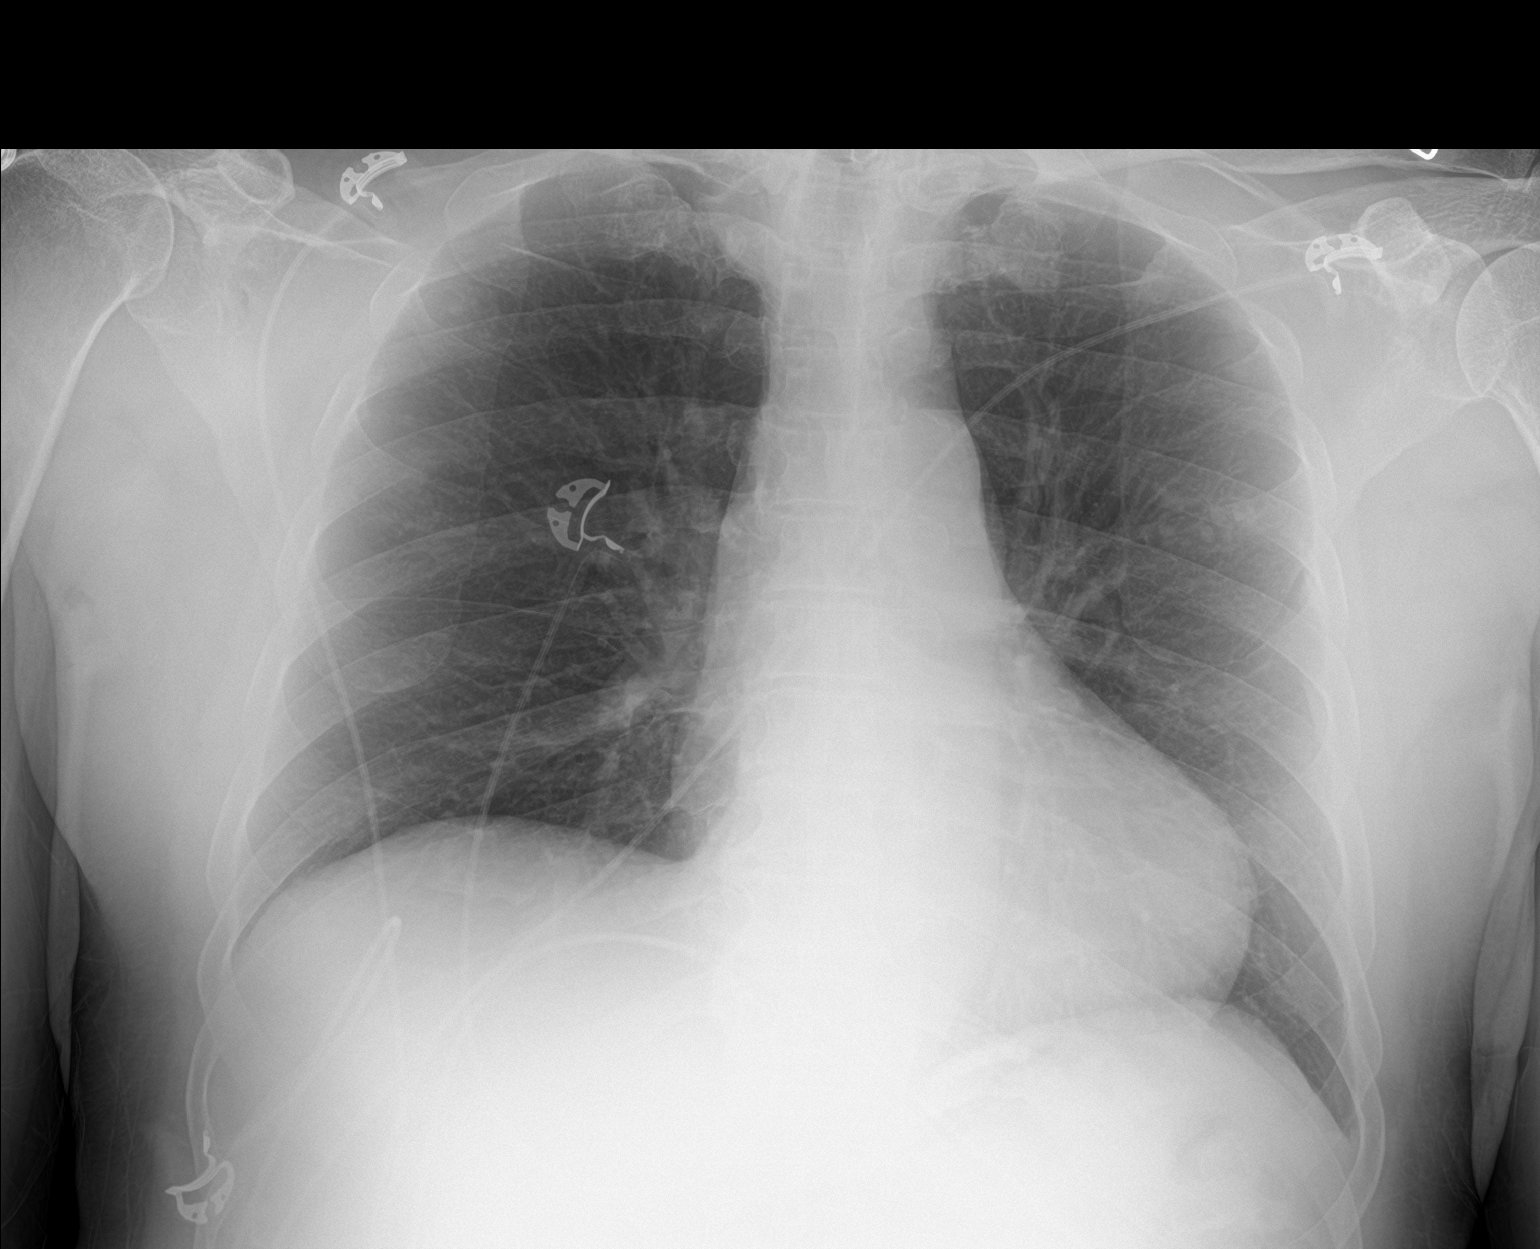

[2 of 2 positions shown; findings below may reference images not displayed]

FINDINGS: The heart size and mediastinal contours are within normal limits. No
acute pulmonary infiltrate, consolidation, or effusion.
Cardiomediastinal silhouette stable. Small faint nodular opacities
are seen in the left upper lobe and could relate to pulmonary
vessels on end. No pneumothorax. Atherosclerotic calcifications in
the aorta. Probable old left mid clavicular fracture.
IMPRESSION: 1. No radiographic evidence for acute cardiopulmonary abnormality
2. Faint nodular opacities in the left upper lobe, possible
pulmonary vessels on end versus small nodules. Suggest radiographic
follow-up.

## 2018-02-11 IMAGING — CR DG CHEST 1V PORT
1 series · 1 of 1 positions shown · non-contrast
Comparison: 10/28/2015.

CLINICAL DATA: Post CABG.

EXAM:
PORTABLE CHEST 1 VIEW

[AP]
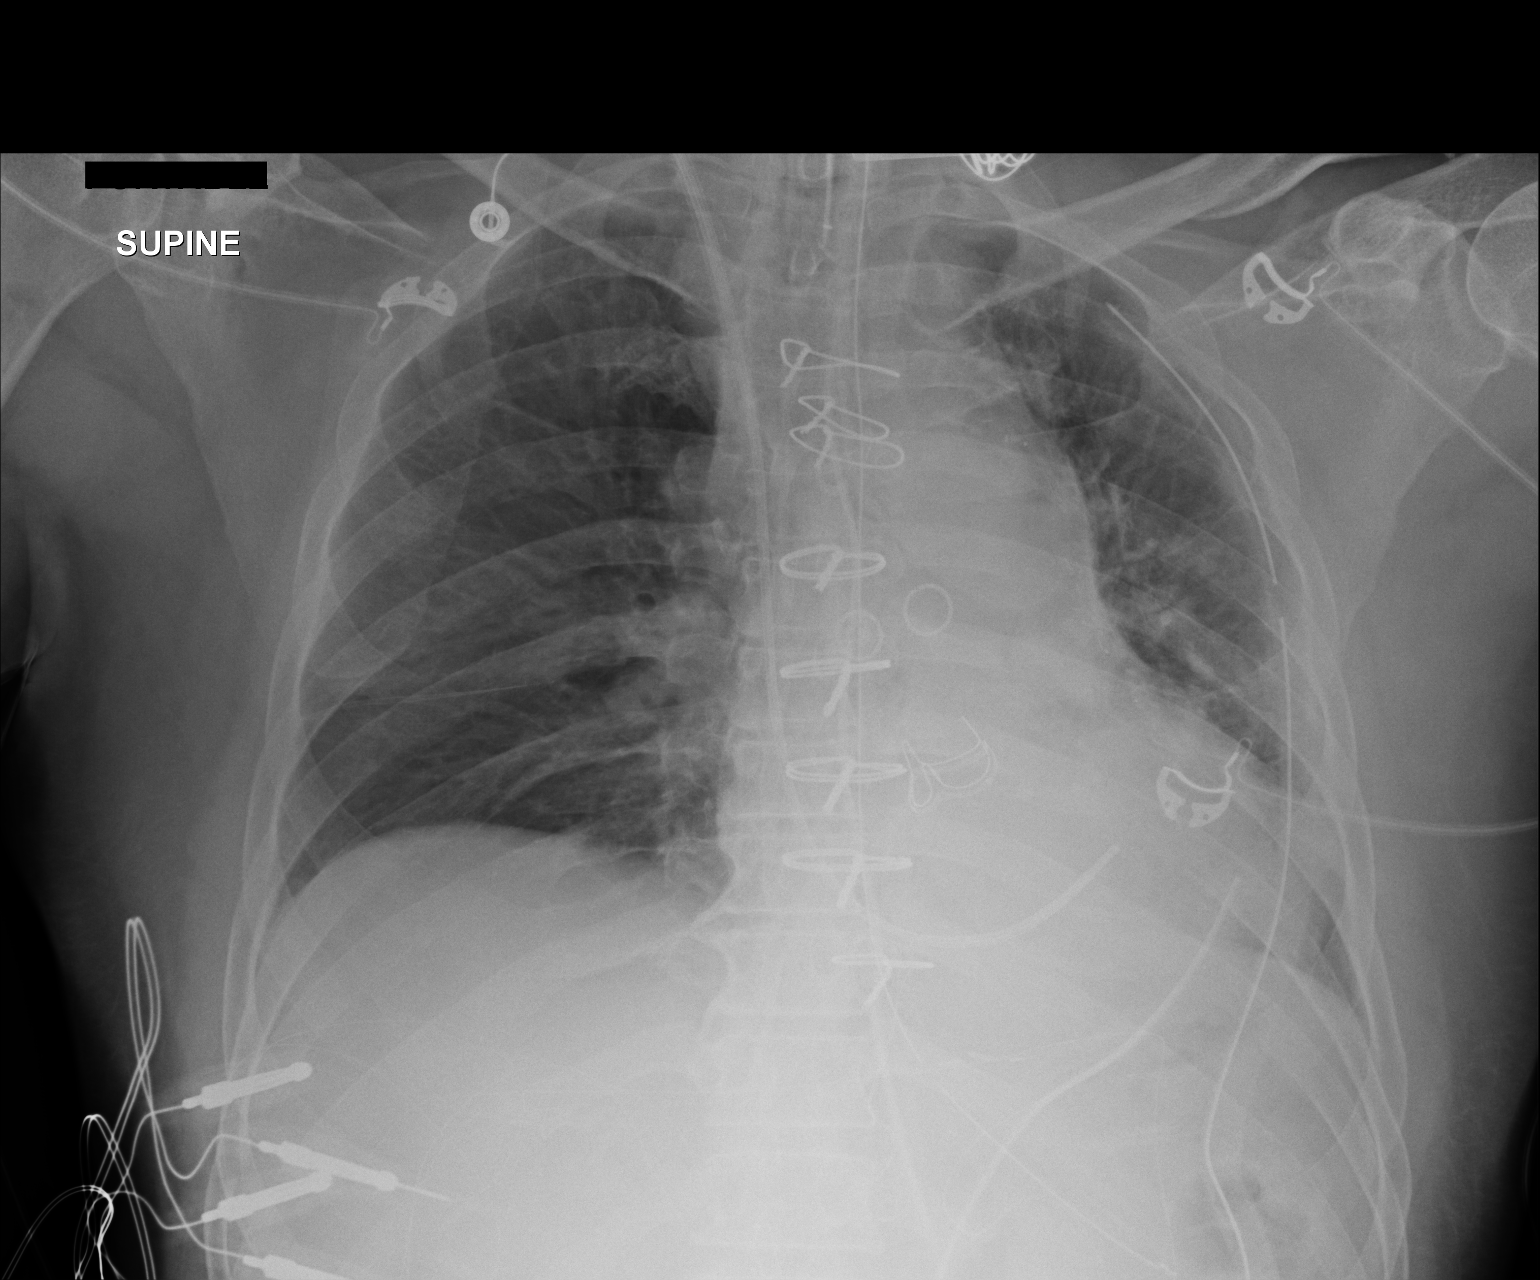

[1 of 1 positions shown; findings below may reference images not displayed]

FINDINGS: Cardiomegaly. Status post CABG. Status post AVR. Mediastinal
widening status post aortic root replacement. Swan-Ganz catheter tip
RIGHT ventricle, and should be advanced. Orogastric tube in the
stomach. LEFT chest tube good position. Endotracheal tube 7 cm above
carina. Mediastinal tube good position. No pneumothorax. Mild
vascular congestion.
IMPRESSION: Swan-Ganz catheter should be advanced into the RIGHT or LEFT
pulmonary artery as it is too proximal. Postsurgical changes without
other adverse features.

These results will be called to the ordering clinician or
representative by the Radiologist Assistant, and communication
documented in the PACS or zVision Dashboard.

## 2018-02-12 IMAGING — CR DG CHEST 1V PORT
1 series · 1 of 1 positions shown · non-contrast
Comparison: 10/29/2015

CLINICAL DATA: Status post aortic valve replacement and CABG
surgery.

EXAM:
PORTABLE CHEST 1 VIEW

[AP]
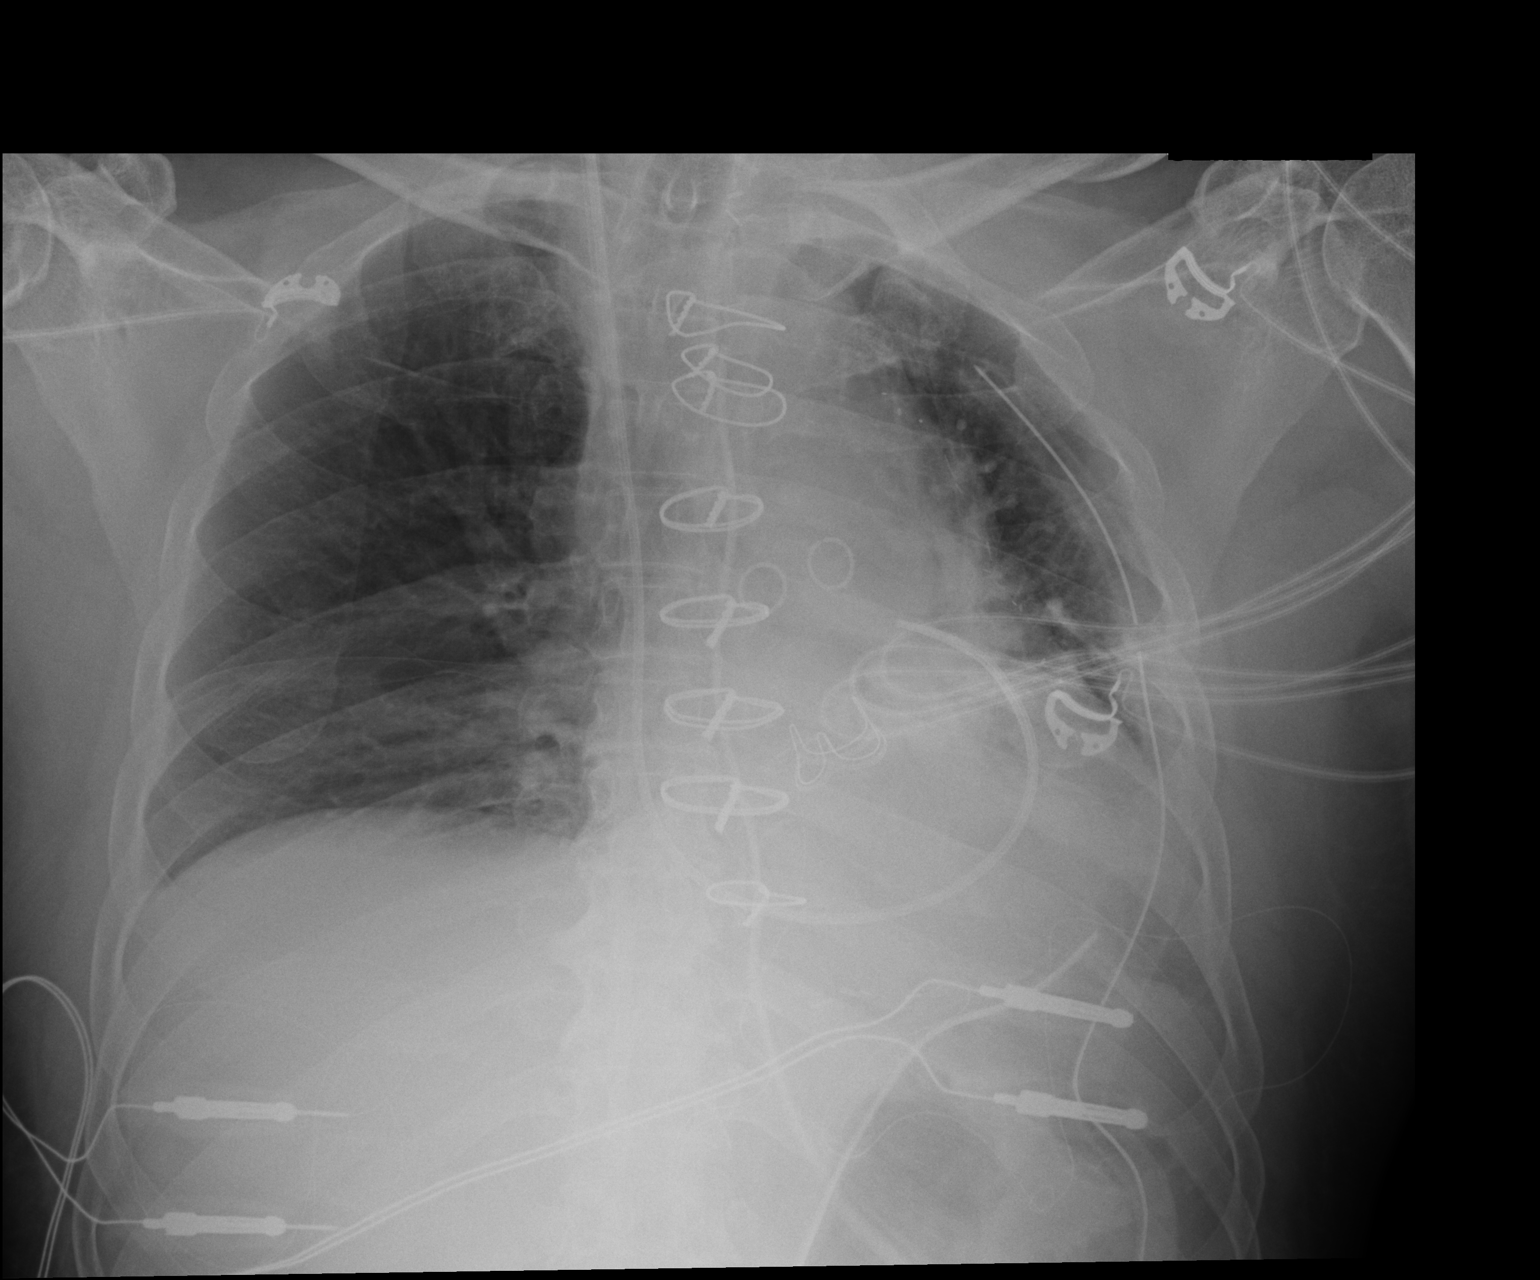

[1 of 1 positions shown; findings below may reference images not displayed]

FINDINGS: Since the prior study, the endotracheal tube and the nasal/
orogastric tube have been removed.

Right internal jugular Swan-Ganz catheter, left chest tubes and the
mediastinal tube are stable.

Cardiac silhouette is normal in size.  No mediastinal widening.

Lung base opacity is stable consistent with atelectasis. No
pulmonary edema. No pneumothorax.
IMPRESSION: 1. No acute findings or evidence of an operative complication.
2. Stable lung base opacity consistent with atelectasis.
3. Remaining support apparatus is well positioned.

## 2018-02-15 ENCOUNTER — Telehealth: Payer: Self-pay | Admitting: Family Medicine

## 2018-02-15 NOTE — Telephone Encounter (Signed)
Pt needs to be notified that he can call the [facility] to receive help paying for the [place].  That telephone number is [phone].  Left a message for the pt to return my call.  [employer] agent may inform the pt.

## 2018-02-20 NOTE — Telephone Encounter (Signed)
Left a message for a return call.

## 2018-02-22 NOTE — Telephone Encounter (Signed)
Spoke to the pt and gave him the below information.  Nothing else needed at this time.

## 2018-02-26 IMAGING — CT CT HEAD W/O CM
4 series · 16 of 47 positions shown, 18 images · non-contrast
Comparison: None.

CLINICAL DATA: Nausea, vomiting, and diarrhea for 2 weeks. Near
syncope.

EXAM:
CT HEAD WITHOUT CONTRAST
TECHNIQUE: Contiguous axial images were obtained from the base of the skull
through the vertex without intravenous contrast.

[Series 2: head without · axial · non-contrast · 0.44mm/px · z∈[-93,+32]mm · 7 of 35 slices shown, 9 images]
[im 5/35  brain]
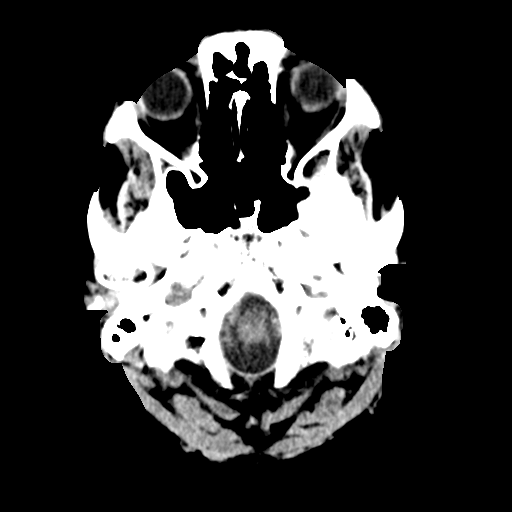
[im 5/35  bone]
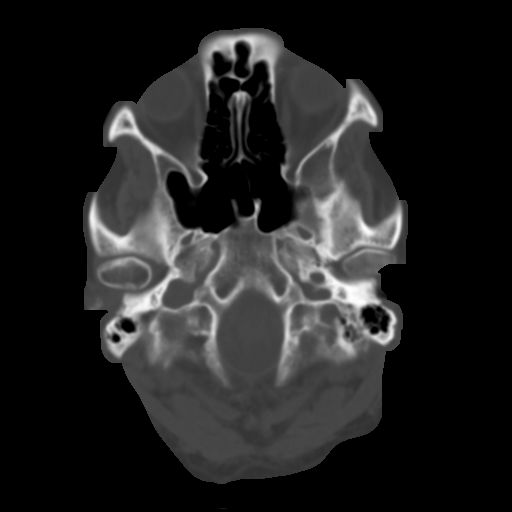
[im 9/35  brain]
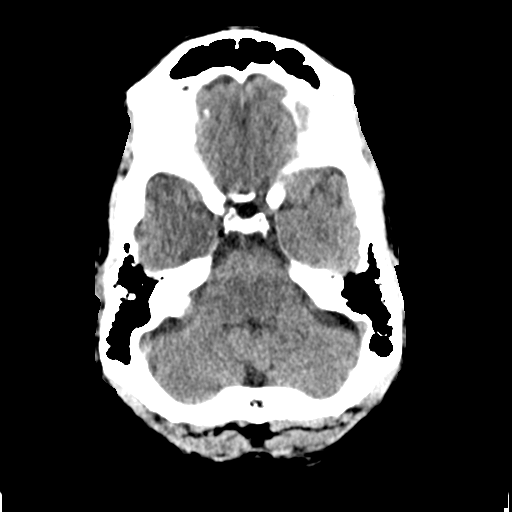
[im 13/35  brain]
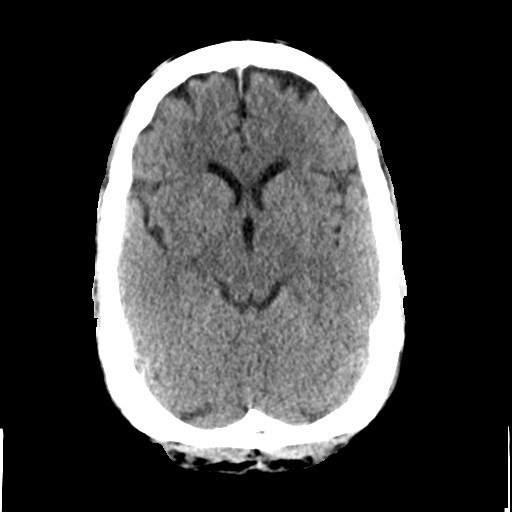
[im 18/35  brain]
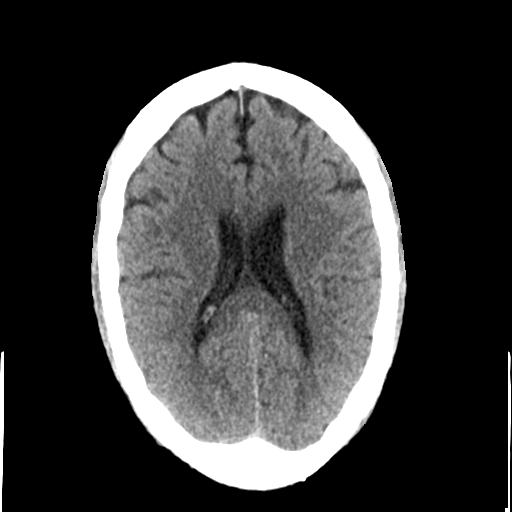
[im 22/35  brain]
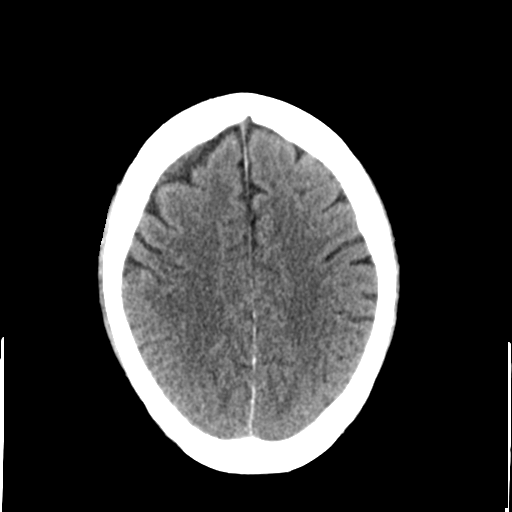
[im 22/35  bone]
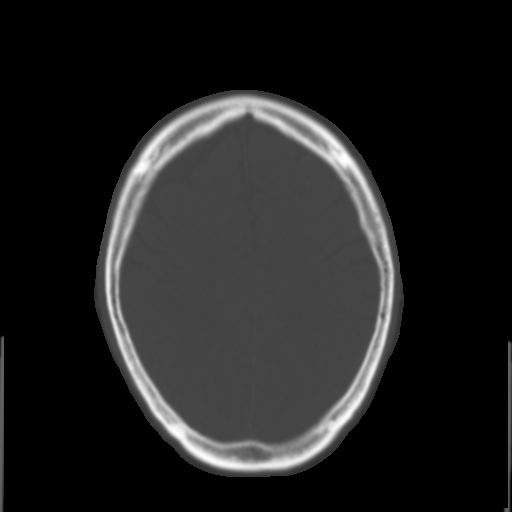
[im 26/35  brain]
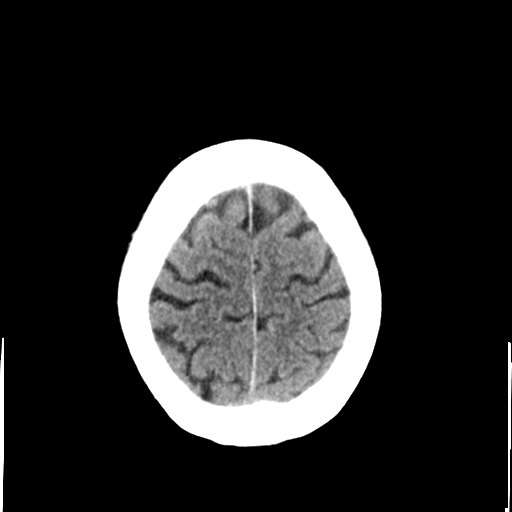
[im 30/35  brain]
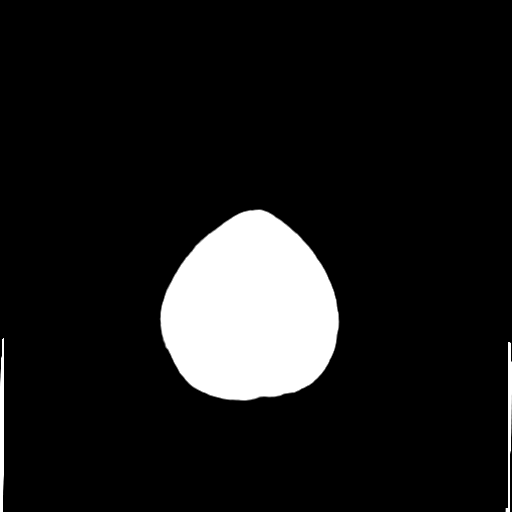

[Series 3: head bone · axial · 0.44mm/px · z∈[-97,-63]mm · 3 of 86 slices shown]
[im 9/86  bone]
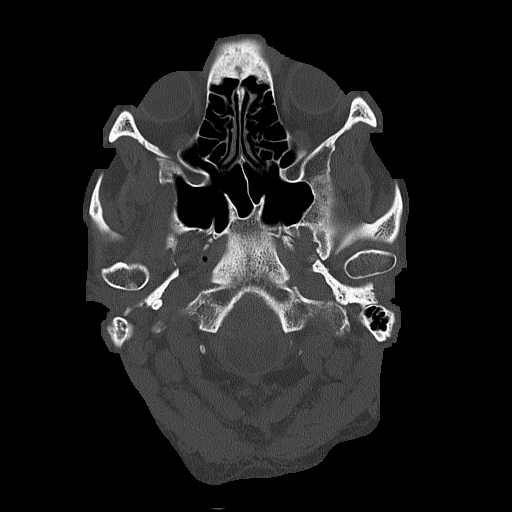
[im 18/86  bone]
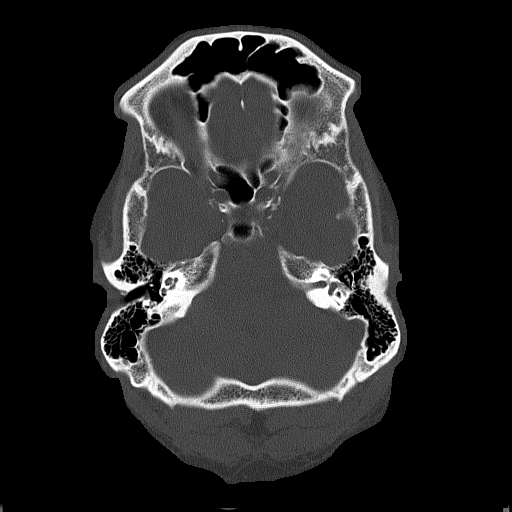
[im 26/86  bone]
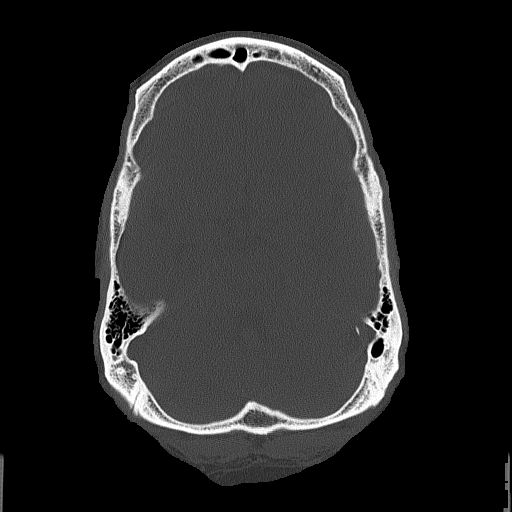

[Series 4: head without cor · coronal · non-contrast · 0.28mm/px · 3 of 67 slices shown]
[im 23/67  brain]
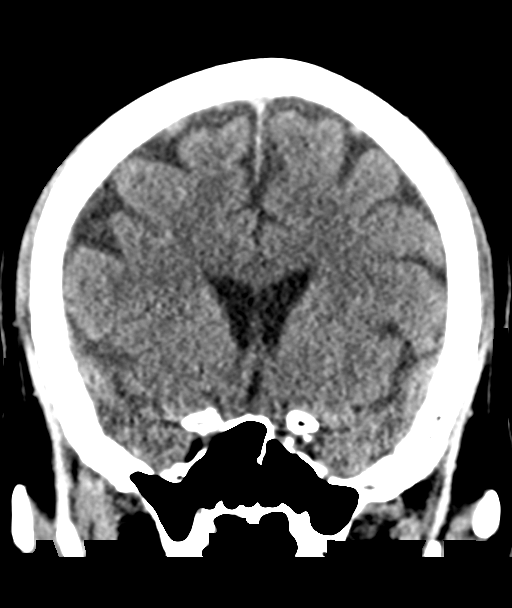
[im 30/67  brain]
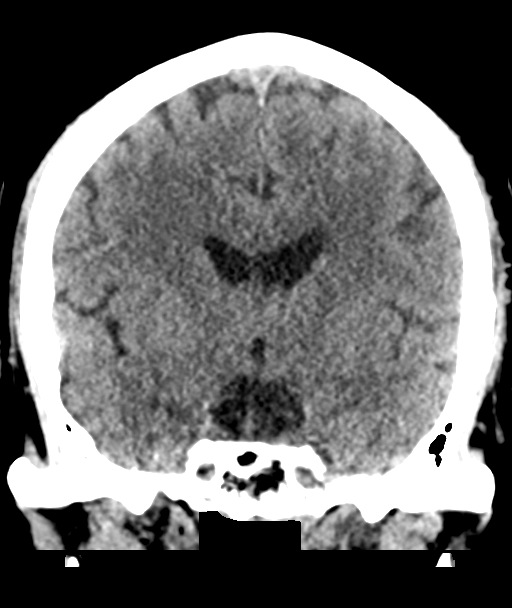
[im 37/67  brain]
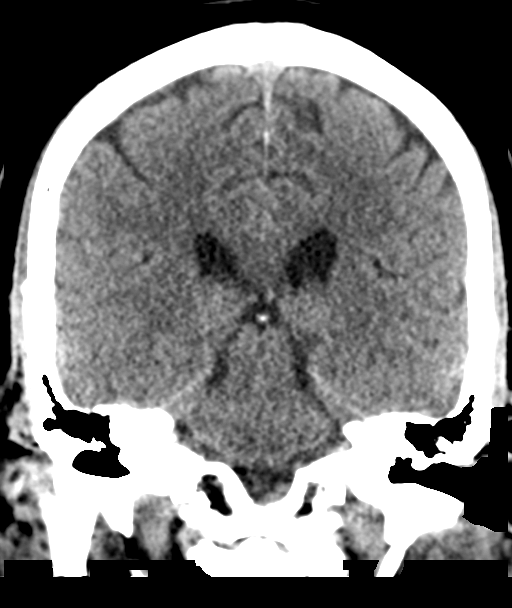

[Series 5: head without sag · sagittal · non-contrast · 0.33mm/px · 3 of 66 slices shown]
[im 22/66  brain]
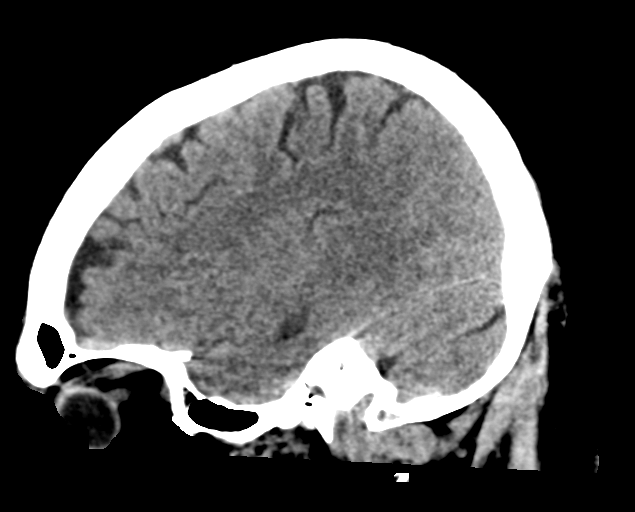
[im 33/66  brain]
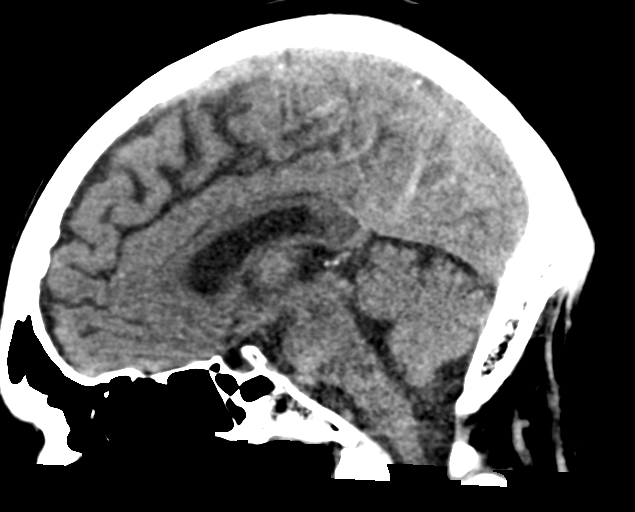
[im 44/66  brain]
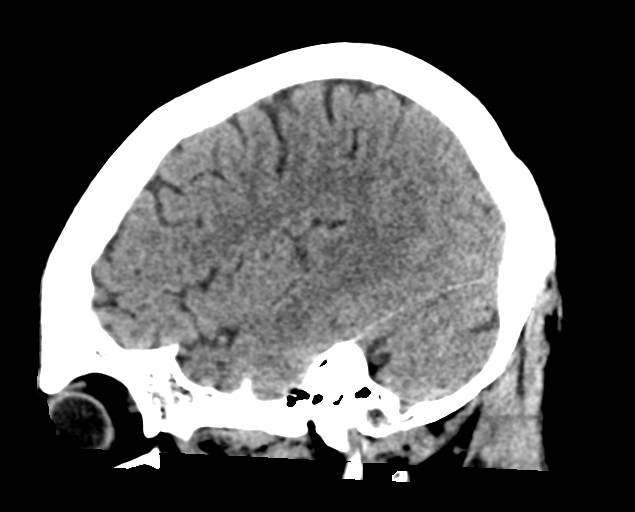

[16 of 47 positions shown; findings below may reference images not displayed]

FINDINGS: Brain: No subdural, epidural, or subarachnoid hemorrhage. Ventricles
and sulci are normal. Cerebellum, brainstem, and basal cisterns are
normal. No acute cortical ischemia or infarct.

Vascular: Calcified atherosclerosis is seen in the intracranial
portions of the carotid arteries.

Skull: Normal. Negative for fracture or focal lesion.

Sinuses/Orbits: No acute finding.

Other: No other abnormalities.
IMPRESSION: No acute intracranial abnormalities.

## 2018-03-19 ENCOUNTER — Other Ambulatory Visit: Payer: Self-pay | Admitting: Adult Health

## 2018-03-19 DIAGNOSIS — E1165 Type 2 diabetes mellitus with hyperglycemia: Principal | ICD-10-CM

## 2018-03-19 DIAGNOSIS — IMO0002 Reserved for concepts with insufficient information to code with codable children: Secondary | ICD-10-CM

## 2018-03-19 DIAGNOSIS — E1140 Type 2 diabetes mellitus with diabetic neuropathy, unspecified: Secondary | ICD-10-CM

## 2018-03-19 DIAGNOSIS — Z794 Long term (current) use of insulin: Principal | ICD-10-CM

## 2018-03-20 NOTE — Telephone Encounter (Signed)
Sent to the pharmacy by e-scribe. 

## 2018-03-30 ENCOUNTER — Other Ambulatory Visit: Payer: Self-pay | Admitting: Adult Health

## 2018-04-02 NOTE — Telephone Encounter (Signed)
Sent to the pharmacy by e-scribe. 

## 2018-04-17 ENCOUNTER — Encounter: Payer: Medicare Other | Admitting: Adult Health

## 2018-04-25 NOTE — Progress Notes (Deleted)
Subjective:   [name] is a 73 y.o. male who presents for Medicare Annual/Subsequent preventive examination.  Review of Systems:  No ROS.  Medicare Wellness Visit. Additional risk factors are reflected in the social history.    Sleep patterns: {SX; SLEEP PATTERNS:18802::"feels rested on waking","does not get up to void","gets up *** times nightly to void","sleeps *** hours nightly"}.    Home Safety/Smoke Alarms: Feels safe in home. Smoke alarms in place.  Living environment; residence and Firearm Safety: {Rehab home environment / accessibility:30080::"no firearms","firearms stored safely"}. Seat Belt Safety/Bike Helmet: Wears seat belt.    Male:   CCS- 08/2015, due 08/2020?     PSA-  Lab Results  Component Value Date   PSA 1.37 04/24/2017   PSA 0.86 03/24/2016   PSA 0.64 09/23/2012       Objective:    Vitals: There were no vitals taken for this visit.  There is no height or weight on file to calculate BMI.  Advanced Directives 02/01/2016 12/05/2015 11/30/2015 11/24/2015 11/24/2015 11/13/2015 10/27/2015  Does Patient Have a Medical Advance Directive? No No No No No No No  Would patient like information on creating a medical advance directive? - No - patient declined information No - patient declined information No - patient declined information No - patient declined information - Yes Higher education careers adviser given    Tobacco Social History   Tobacco Use  Smoking Status Never Smoker  Smokeless Tobacco Never Used     Counseling given: Not Answered    Past Medical History:  Diagnosis Date  . 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 Carpal tunnel syndrome 09/24/2014   Bilateral  . 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 Diabetes mellitus, type 2 ([place])   . Erectile dysfunction   . Essential hypertension   . Hyperlipidemia    Past Surgical History:  Procedure Laterality Date  . AORTIC VALVE REPLACEMENT  N/A 10/29/2015   Procedure: AORTIC VALVE REPLACEMENT (AVR) WITH 23MM MAGNA EASE TISSUE VALVE.;  Surgeon: [name], MD;  Location: [place];  Service: Open Heart Surgery;  Laterality: N/A;  . CARDIAC CATHETERIZATION N/A 10/27/2015   Procedure: Left Heart Cath and Coronary Angiography;  Surgeon: [name], MD;  Location: [place] [facility];  Service: Cardiovascular;  Laterality: N/A;  . CORONARY ARTERY BYPASS GRAFT N/A 10/29/2015   Procedure: CORONARY ARTERY BYPASS GRAFTING (CABG) x Four UTILIZING THE LEFT INTERNAL MAMMARY ARTERY AND ENDOSCOPICALLY HARVESTED RIGHT SAPEHENEOUS VEINS.;  Surgeon: [name], MD;  Location: [place];  Service: Open Heart Surgery;  Laterality: N/A;  . CYST REMOVAL HAND Right   . ESOPHAGOGASTRODUODENOSCOPY N/A 12/02/2015   Procedure: ESOPHAGOGASTRODUODENOSCOPY (EGD);  Surgeon: [name], MD;  Location: [place];  Service: Gastroenterology;  Laterality: N/A;  . REPLACEMENT ASCENDING AORTA N/A 10/29/2015   Procedure: REPLACEMENT OF ASCENDING AORTA USING 34MM X 30CM WOVEN DOUBLE VELOUR VASCULAR GRAFT.;  Surgeon: [name], MD;  Location: [place];  Service: Open Heart Surgery;  Laterality: N/A;  . TEE WITHOUT CARDIOVERSION N/A 10/29/2015   Procedure: TRANSESOPHAGEAL ECHOCARDIOGRAM (TEE);  Surgeon: [name], MD;  Location: [place] [facility];  Service: Open Heart Surgery;  Laterality: N/A;  . UVULECTOMY N/A 12/10/2015   Procedure: UVULECTOMY;  Surgeon: [name], MD;  Location: [facility] OR;  Service: ENT;  Laterality: N/A;   Family History  Problem Relation Age of Onset  . Cancer Father  Throat cancer  . Diabetes Sister    Social History   Socioeconomic History  . Marital status: Married    Spouse name: Not on file  . Number of children: Not on file  . Years of education: Not on file  . Highest education level: Not on file  Occupational History  . Not on file  Social Needs  . Financial resource strain: Not on file  . Food  insecurity:    Worry: Not on file    Inability: Not on file  . Transportation needs:    Medical: Not on file    Non-medical: Not on file  Tobacco Use  . Smoking status: Never Smoker  . Smokeless tobacco: Never Used  Substance and Sexual Activity  . Alcohol use: No  . Drug use: No  . Sexual activity: Yes  Lifestyle  . Physical activity:    Days per week: Not on file    Minutes per session: Not on file  . Stress: Not on file  Relationships  . Social connections:    Talks on phone: Not on file    Gets together: Not on file    Attends religious service: Not on file    Active member of club or organization: Not on file    Attends meetings of clubs or organizations: Not on file    Relationship status: Not on file  Other Topics Concern  . 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     Outpatient Encounter Medications as of 04/26/2018  Medication Sig  . aspirin EC 81 MG tablet Take 81 mg by mouth daily.  . B-D ULTRAFINE III SHORT PEN 31G X 8 MM MISC USE 4 TIMES DAILY AS DIRECTED  . glipiZIDE (GLUCOTROL XL) 10 MG 24 hr tablet TAKE 1 TABLET (10 MG TOTAL) BY MOUTH DAILY WITH BREAKFAST.  . hydrochlorothiazide (HYDRODIURIL) 25 MG tablet Take 1 tablet (25 mg total) by mouth daily.  . Insulin Glargine ([name] KWIKPEN) 100 UNIT/ML SOPN INJECT 0.23 MLS (23 UNITS TOTAL) INTO THE SKIN AT BEDTIME. (Patient taking differently: Inject 25 Units into the skin at bedtime. )  . Insulin Glargine ([name] KWIKPEN) 100 UNIT/ML SOPN INJECT 23 UNITS TOTAL INTO THE SKIN AT BEDTIME.  . metFORMIN (GLUCOPHAGE) 1000 MG tablet TAKE 1 TABLET (1,000 MG TOTAL) BY MOUTH 2 (TWO) TIMES  DAILY WITH A MEAL.  . metoprolol tartrate (LOPRESSOR) 25 MG tablet TAKE 3 TABLETS BY MOUTH TWICE A DAY  . ONETOUCH VERIO test strip TEST BLOOD SUGARS DAILY. DX: E11.9  . potassium chloride (K-DUR) 10 MEQ tablet Take 1 tablet (10 mEq total) by mouth daily.  . rosuvastatin (CRESTOR) 20 MG tablet Take 1 tablet (20 mg total) by mouth daily.   No facility-administered encounter medications on file as of 04/26/2018.     Activities of Daily Living No flowsheet data found.  Patient Care Team: [name], NP as PCP - General (Family Medicine) [name], [name], MD as PCP - Cardiology (Cardiology) Alda Berthold, DO as Consulting Physician (  Neurology) Berle Mull, MD as Consulting Physician (Family Medicine)   Assessment:   This is a routine wellness examination for [place]. Physical assessment deferred to PCP.   Exercise Activities and Dietary recommendations   Diet (meal preparation, eat out, water intake, caffeinated beverages, dairy products, fruits and vegetables): {Desc; diets:16563}       Goals    . Patient Stated     Keep walking and going forward        Fall Risk Fall Risk  04/24/2017 04/24/2017 04/03/2014 03/03/2013  Falls in the past year? No No No No     Depression Screen PHQ 2/9 Scores 04/24/2017 04/24/2017 05/19/2016 02/16/2016  PHQ - 2 Score 0 0 0 0    Cognitive Function MMSE - Mini Mental State Exam 04/24/2017  Not completed: (No Data)        Immunization History  Administered Date(s) Administered  . Influenza Whole 03/15/2009, 04/06/2010  . Influenza, High Dose Seasonal PF 12/29/2013, 12/25/2014, 04/24/2017, 01/16/2018  . Influenza,inj,Quad PF,6+ Mos 11/12/2012, 11/25/2015  . Pneumococcal Conjugate-13 04/24/2014  . Pneumococcal Polysaccharide-23 12/13/2007, 11/25/2015  . Td 09/13/2008    Qualifies for Shingles Vaccine?   Screening Tests Health Maintenance  Topic Date Due  . FOOT EXAM  04/25/2018  . URINE MICROALBUMIN  04/25/2018  . HEMOGLOBIN A1C   07/18/2018  . TETANUS/TDAP  09/14/2018  . OPHTHALMOLOGY EXAM  11/07/2018  . COLONOSCOPY  09/01/2020  . INFLUENZA VACCINE  Completed  . Hepatitis C Screening  Completed  . PNA vac Low Risk Adult  Completed        Plan:     I have personally reviewed and noted the following in the patient's chart:   . Medical and social history . Use of alcohol, tobacco or illicit drugs  . Current medications and supplements . Functional ability and status . Nutritional status . Physical activity . Advanced directives . List of other physicians . Vitals . Screenings to include cognitive, depression, and falls . Referrals and appointments  In addition, I have reviewed and discussed with patient certain preventive protocols, quality metrics, and best practice recommendations. A written personalized care plan for preventive services as well as general preventive health recommendations were provided to patient.     Alphia Moh, RN  04/25/2018

## 2018-04-26 ENCOUNTER — Ambulatory Visit: Payer: Medicare Other

## 2018-04-29 ENCOUNTER — Telehealth: Payer: Self-pay

## 2018-04-29 NOTE — Telephone Encounter (Signed)
Pt has rescheduled his AWV for 05/01/2018

## 2018-04-29 NOTE — Telephone Encounter (Signed)
Author phoned pt. to see if he would like to reschedule awv as pt. No-showed on 3/13. No answer; author left generic VM asking for return call to reschedule.

## 2018-05-01 ENCOUNTER — Telehealth: Payer: Self-pay

## 2018-05-01 ENCOUNTER — Ambulatory Visit: Payer: Medicare Other

## 2018-05-01 NOTE — Telephone Encounter (Signed)
Author phoned pt. To offer to see earlier today and/or reschedule amidst covid-19 concerns. No answer, author left detailed VM asking for return call.

## 2018-05-07 ENCOUNTER — Telehealth: Payer: Self-pay

## 2018-05-07 NOTE — Telephone Encounter (Signed)
Author phoned pt. To offer to schedule virtual awv. No answer, unable to leave VM d/t full inbox. Does not appear pt. use mychart. Will re-attempt.

## 2018-06-05 ENCOUNTER — Ambulatory Visit: Payer: Medicare Other

## 2018-06-28 ENCOUNTER — Encounter: Payer: Self-pay | Admitting: Adult Health

## 2018-06-28 LAB — HM DIABETES EYE EXAM

## 2018-07-18 ENCOUNTER — Other Ambulatory Visit: Payer: Self-pay | Admitting: Adult Health

## 2018-07-25 ENCOUNTER — Other Ambulatory Visit: Payer: Self-pay | Admitting: Cardiovascular Disease

## 2018-09-02 ENCOUNTER — Other Ambulatory Visit: Payer: Self-pay | Admitting: Adult Health

## 2018-09-02 DIAGNOSIS — IMO0002 Reserved for concepts with insufficient information to code with codable children: Secondary | ICD-10-CM

## 2018-09-02 DIAGNOSIS — E1140 Type 2 diabetes mellitus with diabetic neuropathy, unspecified: Secondary | ICD-10-CM

## 2018-09-19 ENCOUNTER — Other Ambulatory Visit: Payer: Self-pay | Admitting: Cardiothoracic Surgery

## 2018-09-19 DIAGNOSIS — I712 Thoracic aortic aneurysm, without rupture, unspecified: Secondary | ICD-10-CM

## 2018-09-19 DIAGNOSIS — I719 Aortic aneurysm of unspecified site, without rupture: Secondary | ICD-10-CM

## 2018-09-27 LAB — HEMOGLOBIN A1C: Hemoglobin A1C: 10.8

## 2018-10-16 ENCOUNTER — Other Ambulatory Visit: Payer: Self-pay | Admitting: Cardiovascular Disease

## 2018-10-16 ENCOUNTER — Other Ambulatory Visit: Payer: Self-pay | Admitting: Adult Health

## 2018-10-17 ENCOUNTER — Encounter: Payer: Self-pay | Admitting: Family Medicine

## 2018-10-17 NOTE — Telephone Encounter (Signed)
Filled for 30 days.  Letter sent by mail. 

## 2018-10-26 ENCOUNTER — Other Ambulatory Visit: Payer: Self-pay | Admitting: Adult Health

## 2018-10-26 DIAGNOSIS — E1140 Type 2 diabetes mellitus with diabetic neuropathy, unspecified: Secondary | ICD-10-CM

## 2018-10-26 DIAGNOSIS — IMO0002 Reserved for concepts with insufficient information to code with codable children: Secondary | ICD-10-CM

## 2018-11-01 ENCOUNTER — Other Ambulatory Visit: Payer: Self-pay | Admitting: Adult Health

## 2018-11-05 NOTE — Telephone Encounter (Signed)
Denied.  Not seen for A1C since 01/2018.

## 2018-11-07 ENCOUNTER — Ambulatory Visit: Payer: Medicare Other | Admitting: Cardiothoracic Surgery

## 2018-11-07 ENCOUNTER — Ambulatory Visit
Admission: RE | Admit: 2018-11-07 | Discharge: 2018-11-07 | Disposition: A | Discharge status: Home / Self Care | Payer: Medicare Other | Source: Ambulatory Visit | Attending: Cardiothoracic Surgery | Admitting: Cardiothoracic Surgery

## 2018-11-07 ENCOUNTER — Encounter: Payer: Self-pay | Admitting: Cardiothoracic Surgery

## 2018-11-07 ENCOUNTER — Other Ambulatory Visit: Payer: Self-pay

## 2018-11-07 VITALS — BP 169/83 | HR 69 | Temp 97.9°F | Resp 16 | Ht 70.0 in | Wt 194.0 lb

## 2018-11-07 DIAGNOSIS — Z952 Presence of prosthetic heart valve: Secondary | ICD-10-CM | POA: Diagnosis not present

## 2018-11-07 DIAGNOSIS — Z951 Presence of aortocoronary bypass graft: Secondary | ICD-10-CM

## 2018-11-07 DIAGNOSIS — I712 Thoracic aortic aneurysm, without rupture, unspecified: Secondary | ICD-10-CM

## 2018-11-07 MED ORDER — IOPAMIDOL (ISOVUE-370) INJECTION 76%
75.0000 mL | Freq: Once | INTRAVENOUS | Status: AC | PRN
  Administered 2018-11-07: 75 mL via INTRAVENOUS

## 2018-11-07 NOTE — Progress Notes (Signed)
[address]       [place] [address]             [phone]      [facility] Medical Record [id number] Date of Birth: [birth year]  Referring: [name], [name], MD Primary Care: [name], NP  Chief Complaint:   POST OP FOLLOW UP 10/29/2015   OPERATIVE REPORT  PREOPERATIVE DIAGNOSES:  Severe 3-vessel coronary occlusive disease. Bicuspid aortic valve with moderate to severe aortic stenosis.  Dilated ascending aorta 4.7 cm. PROCEDURE PERFORMED: 1. Coronary artery bypass grafting x4 with left internal mammary to     the left anterior descending coronary artery, sequential reverse     saphenous vein graft to the first and second obtuse marginal,     reverse saphenous vein graft to the distal right coronary artery     with right vein greater saphenous endo vein harvesting. 2. Replacement of aortic valve with pericardial tissue valve Edwards     [employer], model 3300 TFX 23 mm, serial [id number] supra coronary     replacement of ascending aorta with 34-mm Hemashield graft,     Wheat procedure. SURGEON:  [name], MD.  History of Present Illness:     Patient returns for follow-up after replacement of his ascending aorta and aortic valve and coronary artery bypass grafting x4 done September 2017.  Since last seen 18 months ago he has been doing well without recurrent angina or evidence of congestive heart failure.  He noted that 3 months ago his wife died of complications of abdominal bowel problems, was in and out of current hospital and [facility].  He is now living alone.     Past Surgical History:  Procedure Laterality Date  . AORTIC VALVE REPLACEMENT N/A 10/29/2015   Procedure: AORTIC VALVE REPLACEMENT (AVR) WITH 23MM MAGNA EASE TISSUE VALVE.;  Surgeon: [name], MD;  Location: [facility];  Service: Open Heart Surgery;  Laterality: N/A;  . CARDIAC CATHETERIZATION N/A 10/27/2015   Procedure: Left Heart Cath and Coronary  Angiography;  Surgeon: [name], MD;  Location: [facility];  Service: Cardiovascular;  Laterality: N/A;  . CORONARY ARTERY BYPASS GRAFT N/A 10/29/2015   Procedure: CORONARY ARTERY BYPASS GRAFTING (CABG) x Four UTILIZING THE LEFT INTERNAL MAMMARY ARTERY AND ENDOSCOPICALLY HARVESTED RIGHT SAPEHENEOUS VEINS.;  Surgeon: [name], MD;  Location: [facility];  Service: Open Heart Surgery;  Laterality: N/A;  . CYST REMOVAL HAND Right   . ESOPHAGOGASTRODUODENOSCOPY N/A 12/02/2015   Procedure: ESOPHAGOGASTRODUODENOSCOPY (EGD);  Surgeon: [name], MD;  Location: [facility];  Service: Gastroenterology;  Laterality: N/A;  . REPLACEMENT ASCENDING AORTA N/A 10/29/2015   Procedure: REPLACEMENT OF ASCENDING AORTA USING 34MM X 30CM WOVEN DOUBLE VELOUR VASCULAR GRAFT.;  Surgeon: [name], MD;  Location: [facility];  Service: Open Heart Surgery;  Laterality: N/A;  . TEE WITHOUT CARDIOVERSION N/A 10/29/2015   Procedure: TRANSESOPHAGEAL ECHOCARDIOGRAM (TEE);  Surgeon: [name], MD;  Location: [facility];  Service: Open Heart Surgery;  Laterality: N/A;  . UVULECTOMY N/A 12/10/2015   Procedure: UVULECTOMY;  Surgeon: [name], MD;  Location: [facility];  Service: ENT;  Laterality: N/A;     Past Medical History:  Diagnosis Date  . 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 Carpal tunnel syndrome 09/24/2014   Bilateral  . 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 Diabetes  mellitus, type 2 ([place])   . Erectile dysfunction   . Essential hypertension   . Hyperlipidemia      Social History   Tobacco Use  Smoking Status Never Smoker  Smokeless Tobacco Never Used    Social History   Substance and Sexual Activity  Alcohol Use No     Allergies  Allergen Reactions  . Amiodarone Nausea And Vomiting    Current Outpatient Medications  Medication Sig Dispense Refill  . aspirin EC 81 MG tablet Take 81 mg by mouth  daily.    . B-D ULTRAFINE III SHORT PEN 31G X 8 MM MISC USE 4 TIMES DAILY AS DIRECTED 100 each 0  . glipiZIDE (GLUCOTROL XL) 10 MG 24 hr tablet TAKE 1 TABLET (10 MG TOTAL) BY MOUTH DAILY WITH BREAKFAST. 30 tablet 0  . hydrochlorothiazide (HYDRODIURIL) 25 MG tablet Take 1 tablet (25 mg total) by mouth daily. 90 tablet 3  . Insulin Glargine ([name] KWIKPEN) 100 UNIT/ML SOPN INJECT 0.23 MLS (23 UNITS TOTAL) INTO THE SKIN AT BEDTIME. (Patient taking differently: Inject 25 Units into the skin at bedtime. ) 5 pen 1  . metFORMIN (GLUCOPHAGE) 1000 MG tablet TAKE 1 TABLET (1,000 MG TOTAL) BY MOUTH 2 (TWO) TIMES DAILY WITH A MEAL. 180 tablet 0  . metoprolol tartrate (LOPRESSOR) 25 MG tablet Take 3 tablets by mouth BID.  **Due for Physical** 180 tablet 0  . ONETOUCH VERIO test strip TEST BLOOD SUGARS DAILY. DX: E11.9 100 each 3  . potassium chloride (K-DUR) 10 MEQ tablet Take 1 tablet (10 mEq total) by mouth daily. Please make overdue appt with Dr. Acie Fredrickson before anymore refills. 1st attempt 30 tablet 0  . rosuvastatin (CRESTOR) 20 MG tablet TAKE 1 TABLET BY MOUTH EVERY DAY 90 tablet 0   No current facility-administered medications for this visit.        Physical Exam: BP (!) 169/83 (BP Location: Left Arm, Patient Position: Sitting, Cuff Size: Normal) Comment: HASN'T TAKEN MEDS TODAY  Pulse 69   Temp 97.9 F (36.6 C)   Resp 16   Ht 5\' 10"  (1.778 m)   Wt 194 lb (88 kg)   SpO2 97% Comment: RA  BMI 27.84 kg/m   General appearance: alert, cooperative and no distress Head: Normocephalic, without obvious abnormality, atraumatic Neck: no adenopathy, no carotid bruit, no JVD, supple, symmetrical, trachea midline and thyroid not enlarged, symmetric, no tenderness/mass/nodules Lymph nodes: Cervical, supraclavicular, and axillary nodes normal. Resp: clear to auscultation bilaterally Cardio: regular rate and rhythm, S1, S2 normal, no murmur, click, rub or gallop GI: soft, non-tender; bowel sounds  normal; no masses,  no organomegaly Extremities: extremities normal, atraumatic, no cyanosis or edema and Homans sign is negative, no sign of DVT Neurologic: Grossly normal    Diagnostic Studies & Laboratory data:     Recent Radiology Findings:   Ct Angio Chest Aorta W &/or Wo Contrast  Result Date: 11/07/2018 CLINICAL DATA:  73 year old male with history of thoracic aortic aneurysm. EXAM: CT ANGIOGRAPHY CHEST WITH CONTRAST TECHNIQUE: Multidetector CT imaging of the chest was performed using the standard protocol during bolus administration of intravenous contrast. Multiplanar CT image reconstructions and MIPs were obtained to evaluate the vascular anatomy. CONTRAST:  106mL ISOVUE-370 IOPAMIDOL (ISOVUE-370) INJECTION 76% COMPARISON:  Chest CT 05/02/2018. FINDINGS: Cardiovascular: Status post median sternotomy for replacement of the ascending thoracic aorta and aortic valve, as well as CABG including LIMA to the LAD. Ectasia of the proximal aortic arch measuring 4.2 cm in diameter. Bovine type  thoracic aortic arch (normal anatomical variant) incidentally noted. There is aortic atherosclerosis, as well as atherosclerosis of the great vessels of the mediastinum and the coronary arteries, including calcified atherosclerotic plaque in the left main, left anterior descending, left circumflex and right coronary arteries. Mediastinum/Nodes: No pathologically enlarged mediastinal or hilar lymph nodes. Esophagus is unremarkable in appearance. No axillary lymphadenopathy. Lungs/Pleura: No suspicious appearing pulmonary nodules or masses are noted. No acute consolidative airspace disease. No pleural effusions. 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 Aortic atherosclerosis. Musculoskeletal: Median sternotomy wires. There are no aggressive appearing lytic or blastic lesions noted in the visualized portions of the skeleton. Review of the MIP images confirms the  above findings. IMPRESSION: 1. Postoperative changes of [place] procedure, without acute complicating features. 2. Ectasia of proximal aortic arch measuring 4.2 cm in diameter, similar to the prior examination. 3. Aortic atherosclerosis, in addition to left main and 3 vessel coronary artery disease. Status post median sternotomy for CABG including LIMA to the LAD. Aortic Atherosclerosis (ICD10-I70.0). Electronically Signed   By: Vinnie Langton M.D.   On: 11/07/2018 14:42     I have independently reviewed the above radiology studies  and reviewed the findings with the patient.  Ct Angio Chest Aorta W/cm &/or Wo/cm  Result Date: 04/13/2016 CLINICAL DATA:  Follow-up thoracic aortic aneurysm. History of open heart surgery. EXAM: CT ANGIOGRAPHY CHEST WITH CONTRAST TECHNIQUE: Multidetector CT imaging of the chest was performed using the standard protocol during bolus administration of intravenous contrast. Multiplanar CT image reconstructions and MIPs were obtained to evaluate the vascular anatomy. CONTRAST:  16mL ISOVUE-300 IOPAMIDOL (ISOVUE-300) INJECTION 61% COMPARISON:  08/27/2015; 03/31/2011 FINDINGS: Vascular Findings: Post median sternotomy, CABG, open aortic valve and ascending thoracic aortic repair. The patient is undergone open aortic valve repair as well as replacement of the ascending thoracic aorta without evidence of complication, specifically, no evidence of extravasation or vessel dissection. There is grossly unchanged aneurysmal dilatation of the residual ascending thoracic aorta as well as the proximal aspect of the aortic arch with measurements as follows. The thoracic aorta tapers to a normal caliber at the level of the mid/ distal aspect of the aortic arch. Minimal amount of slightly irregular mixed calcified and noncalcified atherosclerotic plaque within normal caliber descending thoracic aorta. Bovine configuration of the aortic arch. The branch vessels of the aortic arch appear widely  patent throughout their imaged course. Borderline cardiomegaly. Calcifications within the native coronary arteries. Trace amount of pericardial fluid, presumably physiologic. Although this examination was not tailored for the evaluation the pulmonary arteries, there are no discrete filling defects within the central pulmonary arterial tree to suggest central pulmonary embolism. Normal caliber the main pulmonary artery. ------------------------------------------------------------- Thoracic aortic measurements: Sinotubular junction 34 mm as measured in greatest oblique coronal dimension. Proximal ascending aorta 36 mm as measured in greatest oblique axial dimension at the level of the main pulmonary artery. 44 mm at the level of the more cranial ascending thoracic aorta (sagittal image 79, series 602), unchanged since the 2013 examination, previously, 47 mm Aortic arch aorta 30 mm as measured in greatest oblique sagittal dimension. Proximal descending thoracic aorta 28 mm as measured in greatest oblique axial dimension at the level of the main pulmonary artery. Distal descending thoracic aorta 23 mm as measured in greatest oblique axial dimension at the level of the diaphragmatic hiatus. Review of the MIP images confirms the above findings. ------------------------------------------------------------- Non-Vascular Findings: Mediastinum/Lymph Nodes: No bulky mediastinal, hilar axillary lymphadenopathy.  Lungs/Pleura: Slightly nodular pleuroparenchymal thickening about the anterior nondependent portion of the bilateral upper lobes with associated scattered pleural calcifications are similar to the 03/2011 examination. Punctate (approximately 0.4 cm) noncalcified subpleural nodule within the superior segment of the right lower lobe (image 49, series 4), similar to the 03/2011 examination. No new focal airspace opacities. No pleural effusion or pneumothorax. The central pulmonary airways appear widely patent. Upper abdomen:  Limited early arterial phase evaluation of the upper abdomen demonstrates a large (at least 5.0 x 5.4 cm) diverticulum arising from the posterior aspect of the gastric fundus (image 90, series 3), similar to the 03/2011 exam Musculoskeletal: No acute or aggressive osseous abnormalities. Stigmata of DISH within the caudal aspect of the thoracic spine. Mild bilateral gynecomastia.  Regional soft tissues appear normal. IMPRESSION: 1. Post open repair of the ascending thoracic aorta without evidence of complication. 2. 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 3. Stable pleural calcifications, likely the sequela of prior asbestos exposure and similar to the 03/2011 examination. 4. 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 5.  Aortic Atherosclerosis (ICD10-170.0) 6.  Aortic aneurysm NOS (ICD10-I71.9) Electronically Signed   By: Sandi Mariscal M.D.   On: 04/13/2016 15:45     Recent Lab Findings: Lab Results  Component Value Date   WBC 8.8 04/24/2017   HGB 11.9 (L) 04/24/2017   HCT 35.5 (L) 04/24/2017   PLT 297.0 04/24/2017   GLUCOSE 109 (H) 09/20/2017   CHOL 187 09/20/2017   TRIG 75 09/20/2017   HDL 95 09/20/2017   LDLDIRECT 112.6 05/14/2012   LDLCALC 77 09/20/2017   ALT 19 09/20/2017   AST 21 09/20/2017   NA 141 09/20/2017   K 4.2 09/20/2017   CL 102 09/20/2017   CREATININE 1.15 09/20/2017   BUN 15 09/20/2017   CO2 23 09/20/2017   TSH 1.13 04/24/2017   INR 2.52 11/24/2015   HGBA1C 8.4 (A) 01/16/2018      Assessment / Plan:  1/The patient was again reminded about antibiotic prophylaxis around invasive procedures with his prosthetic valve.-He notes that he has a full set of dentures 2/stable ascending aortic replacement and stable aortic arch following aortic valve replacement , and  supra coronary replacement of the ascending aorta and coronary artery bypass grafting  Plan CTA of the chest 18 months.  Patient was reminded to be diligent about his blood pressure medications      [name] MD      [address] [place] [address] Office [phone]   [facility] [phone]  11/07/2018 3:10 PM

## 2018-11-08 ENCOUNTER — Encounter: Payer: Self-pay | Admitting: Cardiology

## 2018-11-08 ENCOUNTER — Ambulatory Visit (INDEPENDENT_AMBULATORY_CARE_PROVIDER_SITE_OTHER): Payer: Medicare Other | Admitting: Cardiology

## 2018-11-08 VITALS — BP 120/78 | HR 65 | Ht 70.0 in | Wt 195.8 lb

## 2018-11-08 DIAGNOSIS — I480 Paroxysmal atrial fibrillation: Secondary | ICD-10-CM | POA: Diagnosis not present

## 2018-11-08 DIAGNOSIS — I712 Thoracic aortic aneurysm, without rupture, unspecified: Secondary | ICD-10-CM

## 2018-11-08 DIAGNOSIS — E1149 Type 2 diabetes mellitus with other diabetic neurological complication: Secondary | ICD-10-CM

## 2018-11-08 DIAGNOSIS — I10 Essential (primary) hypertension: Secondary | ICD-10-CM | POA: Diagnosis not present

## 2018-11-08 DIAGNOSIS — IMO0002 Reserved for concepts with insufficient information to code with codable children: Secondary | ICD-10-CM

## 2018-11-08 DIAGNOSIS — E1165 Type 2 diabetes mellitus with hyperglycemia: Secondary | ICD-10-CM

## 2018-11-08 DIAGNOSIS — Z952 Presence of prosthetic heart valve: Secondary | ICD-10-CM

## 2018-11-08 DIAGNOSIS — I2510 Atherosclerotic heart disease of native coronary artery without angina pectoris: Secondary | ICD-10-CM

## 2018-11-08 DIAGNOSIS — E785 Hyperlipidemia, unspecified: Secondary | ICD-10-CM

## 2018-11-08 DIAGNOSIS — Z79899 Other long term (current) drug therapy: Secondary | ICD-10-CM

## 2018-11-08 NOTE — Progress Notes (Signed)
Cardiology Office Note:    Date:  11/08/2018   ID:  [name] 1946/01/05, MRN [id number]  PCP:  [name], NP  Cardiologist:  Mertie Moores, MD  Referring MD: [name], NP   Chief Complaint  Patient presents with   Follow-up    AVR   Coronary Artery Disease    History of Present Illness:    [name] is a 73 y.o. male with a past medical history significant for CAD s/p CABG 2017 with aortic valve replacement for aortic stenosis, hyperlipidemia, diabetes type 2, mild dilatation of the ascending aorta.  Patient has a history of bioprosthetic AVR in 2017 and also underwent CABG with LIMA to LAD, sequential reverse SVG to first and second OM, SVG to distal RCA.  His hospitalization was complicated by postop A. fib, initially treated with amiodarone.  He had several hospitalizations following that for nausea and vomiting and amiodarone was eventually discontinued.  He remained in normal sinus rhythm and anticoagulation was stopped.  He was continued on aspirin.  The patient was seen in follow-up by the cardiovascular surgeon yesterday and was noted to be doing well.  He was reminded of antibiotic prophylaxis around invasive procedures with his prosthetic valve.  He noted that he has a full set of dentures.  He was noted to have stable ascending aortic replacement and stable aortic arch following aortic valve replacement and supra coronary replacement of the ascending aorta.  He is planned for CTA of the chest in 18 months.  The patient was reminded to be diligent about his blood pressure medications.  The patient is here today for annual follow-up, last office visit was in 06/2016. 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 The patient's wife died earlier this year of complications of abdominal bowel issues.  His 2 daughters live with him now. He walks in his neighborhood about 3-4 times per week about a mile. He also push mows his  grass. He denies chest pain or shortness of breath. He denies orthopnea, PND or edema.   He has occasional left chest wall muscle pain, "only once in a while".  Does not bother him much.   Cardiac studies   Chest CT 11/07/2018  IMPRESSION: 1. Postoperative changes of Bent[place] procedure, without acute complicating features. 2. Ectasia of proximal aortic arch measuring 4.2 cm in diameter, similar to the prior examination. 3. Aortic atherosclerosis, in addition to left main and 3 vessel coronary artery disease. Status post median sternotomy for CABG including LIMA to the LAD.   Echocardiogram 11/16/2015 Study Conclusions  - Left ventricle: The cavity size was normal. Wall thickness was   increased in a pattern of moderate LVH. Systolic function was   normal. The estimated ejection fraction was in the range of 55%   to 60%. Wall motion was normal; there were no regional wall   motion abnormalities. Features are consistent with a pseudonormal   left ventricular filling pattern, with concomitant abnormal   relaxation and increased filling pressure (grade 2 diastolic   dysfunction). - Aortic valve: A bioprosthesis was present. Valve area (VTI): 1.25   cm^2. Valve area (Vmax): 1.19 cm^2. Valve area (Vmean): 1.08   cm^2.  Impressions: - Normal LV function   There is a mild gradient across the bioprosthetic AV.   This gradient is normal for this valve.   Past Medical History:  Diagnosis Date   Aortic stenosis    Status post pericardial  AVR September 2017   Carpal tunnel syndrome 09/24/2014   Bilateral   Coronary artery disease    Multivessel status post CABG September 2017   Diabetes mellitus, type 2 [facility])    Erectile dysfunction    Essential hypertension    Hyperlipidemia     Past Surgical History:  Procedure Laterality Date   AORTIC VALVE REPLACEMENT N/A 10/29/2015   Procedure: AORTIC VALVE REPLACEMENT (AVR) WITH 23MM MAGNA EASE TISSUE VALVE.;  Surgeon: [name], MD;  Location: [place] [facility];  Service: Open Heart Surgery;  Laterality: N/A;   CARDIAC CATHETERIZATION N/A 10/27/2015   Procedure: Left Heart Cath and Coronary Angiography;  Surgeon: [name], MD;  Location: [place] [facility];  Service: Cardiovascular;  Laterality: N/A;   CORONARY ARTERY BYPASS GRAFT N/A 10/29/2015   Procedure: CORONARY ARTERY BYPASS GRAFTING (CABG) x Four UTILIZING THE LEFT INTERNAL MAMMARY ARTERY AND ENDOSCOPICALLY HARVESTED RIGHT SAPEHENEOUS VEINS.;  Surgeon: [name], MD;  Location: [place];  Service: Open Heart Surgery;  Laterality: N/A;   CYST REMOVAL HAND Right    ESOPHAGOGASTRODUODENOSCOPY N/A 12/02/2015   Procedure: ESOPHAGOGASTRODUODENOSCOPY (EGD);  Surgeon: [name], MD;  Location: [place];  Service: Gastroenterology;  Laterality: N/A;   REPLACEMENT ASCENDING AORTA N/A 10/29/2015   Procedure: REPLACEMENT OF ASCENDING AORTA USING 34MM X 30CM WOVEN DOUBLE VELOUR VASCULAR GRAFT.;  Surgeon: [name], MD;  Location: [place];  Service: Open Heart Surgery;  Laterality: N/A;   TEE WITHOUT CARDIOVERSION N/A 10/29/2015   Procedure: TRANSESOPHAGEAL ECHOCARDIOGRAM (TEE);  Surgeon: [name], MD;  Location: [place];  Service: Open Heart Surgery;  Laterality: N/A;   UVULECTOMY N/A 12/10/2015   Procedure: UVULECTOMY;  Surgeon: [name], MD;  Location: [facility] OR;  Service: ENT;  Laterality: N/A;    Current Medications: Current Meds  Medication Sig   aspirin EC 81 MG tablet Take 81 mg by mouth daily.   B-D ULTRAFINE III SHORT PEN 31G X 8 MM MISC USE 4 TIMES DAILY AS DIRECTED   glipiZIDE (GLUCOTROL XL) 10 MG 24 hr tablet TAKE 1 TABLET (10 MG TOTAL) BY MOUTH DAILY WITH BREAKFAST.   hydrochlorothiazide (HYDRODIURIL) 25 MG tablet Take 1 tablet (25 mg total) by mouth daily.   Insulin Glargine ([name] KWIKPEN) 100 UNIT/ML SOPN INJECT 0.23 MLS (23 UNITS TOTAL) INTO THE SKIN AT BEDTIME. (Patient taking differently: Inject 25  Units into the skin at bedtime. )   metFORMIN (GLUCOPHAGE) 1000 MG tablet TAKE 1 TABLET (1,000 MG TOTAL) BY MOUTH 2 (TWO) TIMES DAILY WITH A MEAL.   metoprolol tartrate (LOPRESSOR) 25 MG tablet Take 3 tablets by mouth BID.  **Due for Physical**   ONETOUCH VERIO test strip TEST BLOOD SUGARS DAILY. DX: E11.9   potassium chloride (K-DUR) 10 MEQ tablet Take 1 tablet (10 mEq total) by mouth daily. Please make overdue appt with Dr. Acie Fredrickson before anymore refills. 1st attempt   rosuvastatin (CRESTOR) 20 MG tablet TAKE 1 TABLET BY MOUTH EVERY DAY     Allergies:   Amiodarone and Quinolones   Social History   Socioeconomic History   Marital status: Married    Spouse name: Not on file   Number of children: Not on file   Years of education: Not on file   Highest education level: Not on file  Occupational History   Not on file  Social Needs   Financial resource strain: Not on file   Food insecurity    Worry: Not on file  Inability: Not on file   Transportation needs    Medical: Not on file    Non-medical: Not on file  Tobacco Use   Smoking status: Never Smoker   Smokeless tobacco: Never Used  Substance and Sexual Activity   Alcohol use: No   Drug use: No   Sexual activity: Yes  Lifestyle   Physical activity    Days per week: Not on file    Minutes per session: Not on file   Stress: Not on file  Relationships   Social connections    Talks on phone: Not on file    Gets together: Not on file    Attends religious service: Not on file    Active member of club or organization: Not on file    Attends meetings of clubs or organizations: Not on file    Relationship status: Not on file  Other Topics Concern   Not on file  Social History Narrative   Married to [name] for 64 years   Retired as Chiropodist   5 children   Never Smoked   Alcohol use- no   Caffeine use: occasional       Lost one child a few months ago    Lost his sister, then son  and then nephew - all within 2 weeks apart   Son had a stroke at 78    Was planning to get married last month in Feb.      Family History: The patient's family history includes Cancer in his father; Diabetes in his sister. ROS:   Please see the history of present illness.     All other systems reviewed and are negative.   EKG:  EKG is ordered today.  The ekg ordered today demonstrates normal sinus rhythm, 65 bpm, no changes from previous EKGs  Recent Labs: No results found for requested labs within last 8760 hours.   Recent Lipid Panel    Component Value Date/Time   CHOL 187 09/20/2017 0836   TRIG 75 09/20/2017 0836   HDL 95 09/20/2017 0836   CHOLHDL 2.0 09/20/2017 0836   CHOLHDL 2 04/24/2017 1355   VLDL 10.6 04/24/2017 1355   LDLCALC 77 09/20/2017 0836   LDLDIRECT 112.6 05/14/2012 1031    Physical Exam:    VS:  BP 120/78    Pulse 65    Ht 5\' 10"  (1.778 m)    Wt 195 lb 12.8 oz (88.8 kg)    SpO2 97%    BMI 28.09 kg/m     Wt Readings from Last 6 Encounters:  11/08/18 195 lb 12.8 oz (88.8 kg)  11/07/18 194 lb (88 kg)  01/16/18 201 lb (91.2 kg)  07/26/17 191 lb (86.6 kg)  07/13/17 194 lb (88 kg)  05/08/17 193 lb 6.4 oz (87.7 kg)     Physical Exam  Constitutional: He is oriented to person, place, and time. He appears well-developed and well-nourished. No distress.  HENT:  Head: Normocephalic and atraumatic.  Neck: Normal range of motion. Neck supple. No JVD present.  Cardiovascular: Normal rate, regular rhythm, normal heart sounds and intact distal pulses. Exam reveals no gallop and no friction rub.  No murmur heard. Pulmonary/Chest: Effort normal and breath sounds normal. No respiratory distress. He has no wheezes. He has no rales.  Abdominal: Soft. Bowel sounds are normal.  Musculoskeletal: Normal range of motion.        General: No edema.  Neurological: He is alert and oriented to person, place, and time.  Skin: Skin is warm and dry.  Psychiatric: He has a  normal mood and affect. His behavior is normal. Judgment and thought content normal.    ASSESSMENT:    1. S/P AVR (aortic valve replacement)   2. Thoracic aortic aneurysm without rupture ([place])   3. Coronary artery disease involving native coronary artery of native heart without angina pectoris   4. Essential (primary) hypertension   5. Paroxysmal atrial fibrillation (HCC)   6. Hyperlipidemia, unspecified hyperlipidemia type   7. Type II diabetes mellitus with neurological manifestations, uncontrolled ([place])   8. Medication management    PLAN:    In order of problems listed above:  History of bioprosthetic aortic valve replacement in 2017 -Pt is doing well and is active.  -SBE prophylaxis.  Not needed for dental as patient has dentures. -No heart failure type symptoms.   Ascending aortic aneurysm -Patient was seen yesterday by Dr. Servando Snare and felt to be stable.  He is planned for CTA of the chest in 18 months.   CAD -Status post CABG 2017 -Guideline directed medical therapy includes aspirin, statin, beta-blocker. -pt is active without anginal symptoms.  Hypertension -Patient needs good blood pressure control in the setting of aortic aneurysm. -Patient is on hydrochlorothiazide 25 mg daily, Lopressor 75 mg twice daily -BP well controlled today. -Last labs in 09/2017 showed normal renal function and potassium. -We will update metabolic panel today.  Paroxysmal atrial fibrillation -Patient had postoperative A. fib after his bypass/AVR in 2017.  No apparent recurrences of A. fib.  Patient is no longer on anticoagulation. -No further episodes of afib.   Hyperlipidemia, goal LDL <70 -On rosuvastatin 20 mg daily -Lipid panel in 09/2017 showed LDL of 77 which is near goal. -We will update lipid panel today.  Diabetes type 2 -Followed by PCP.  On glipizide, metformin and insulin -A1c was 8.4 in 01/2018.  Advised improved diabetes management with target A1c less than 7 to reduce  future cardiac risk. Pt says A1c had been over 11. He has been working to get it down.    Medication Adjustments/Labs and Tests Ordered: Current medicines are reviewed at length with the patient today.  Concerns regarding medicines are outlined above. Labs and tests ordered and medication changes are outlined in the patient instructions below:  Patient Instructions  Medication Instructions:  Your physician recommends that you continue on your current medications as directed. Please refer to the Current Medication list given to you today.  If you need a refill on your cardiac medications before your next appointment, please call your pharmacy.   Lab work: LIPIDS AND CMET TODAY   If you have labs (blood work) drawn today and your tests are completely normal, you will receive your results only by:  MyChart Message (if you have MyChart) OR  A paper copy in the mail If you have any lab test that is abnormal or we need to change your treatment, we will call you to review the results.  Testing/Procedures: NONE  Follow-Up: At [facility], you and your health needs are our priority.  As part of our continuing mission to provide you with exceptional heart care, we have created designated Provider Care Teams.  These Care Teams include your primary Cardiologist (physician) and Advanced Practice Providers (APPs -  Physician Assistants and Nurse Practitioners) who all work together to provide you with the care you need, when you need it. You will need a follow up appointment in:  1 years.  Please call our  office 2 months in advance to schedule this appointment.  You may see Mertie Moores, MD or one of the following Advanced Practice Providers on your designated Care Team: Richardson Dopp, PA-C [place], [place]  Daune Perch, NP  Any Other Special Instructions Will Be Listed Below (If Applicable).   Lifestyle Modifications to Prevent and Treat Heart Disease -Recommend heart  healthy/Mediterranean diet, with whole grains, fruits, vegetables, fish, lean meats, nuts, olive oil and avocado oil.  -Limit salt intake to less than 2000 mg per day.  -Recommend moderate walking, starting slowly with a few minutes and working up to 3-5 times/week for 30-50 minutes each session. Aim for at least 150 minutes.week. Goal should be pace of 3 miles/hours, or walking 1.5 miles in 30 minutes -Recommend avoidance of tobacco products. Avoid excess alcohol. -Keep blood pressure well controlled, ideally less than 130/80.  =======================================   Mediterranean Diet A Mediterranean diet refers to food and lifestyle choices that are based on the traditions of countries located on the [employer]. This way of eating has been shown to help prevent certain conditions and improve outcomes for people who have chronic diseases, like kidney disease and heart disease. What are tips for following this plan? Lifestyle  Cook and eat meals together with your family, when possible.  Drink enough fluid to keep your urine clear or pale yellow.  Be physically active every day. This includes: ? Aerobic exercise like running or swimming. ? Leisure activities like gardening, walking, or housework.  Get 7-8 hours of sleep each night.  If recommended by your health care provider, drink red wine in moderation. This means 1 glass a day for nonpregnant women and 2 glasses a day for men. A glass of wine equals 5 oz (150 mL). Reading food labels   Check the serving size of packaged foods. For foods such as rice and pasta, the serving size refers to the amount of cooked product, not dry.  Check the total fat in packaged foods. Avoid foods that have saturated fat or trans fats.  Check the ingredients list for added sugars, such as corn syrup. Shopping  At the grocery store, buy most of your food from the areas near the walls of the store. This includes: ? Fresh fruits and  vegetables (produce). ? Grains, beans, nuts, and seeds. Some of these may be available in unpackaged forms or large amounts (in bulk). ? Fresh seafood. ? Poultry and eggs. ? Low-fat dairy products.  Buy whole ingredients instead of prepackaged foods.  Buy fresh fruits and vegetables in-season from local farmers markets.  Buy frozen fruits and vegetables in resealable bags.  If you do not have access to quality fresh seafood, buy precooked frozen shrimp or canned fish, such as tuna, salmon, or sardines.  Buy small amounts of raw or cooked vegetables, salads, or olives from the deli or salad bar at your store.  Stock your pantry so you always have certain foods on hand, such as olive oil, canned tuna, canned tomatoes, rice, pasta, and beans. Cooking  Cook foods with extra-virgin olive oil instead of using butter or other vegetable oils.  Have meat as a side dish, and have vegetables or grains as your main dish. This means having meat in small portions or adding small amounts of meat to foods like pasta or stew.  Use beans or vegetables instead of meat in common dishes like chili or lasagna.  Experiment with different cooking methods. Try roasting or broiling vegetables instead of steaming  or sauteing them.  Add frozen vegetables to soups, stews, pasta, or rice.  Add nuts or seeds for added healthy fat at each meal. You can add these to yogurt, salads, or vegetable dishes.  Marinate fish or vegetables using olive oil, lemon juice, garlic, and fresh herbs. Meal planning   Plan to eat 1 vegetarian meal one day each week. Try to work up to 2 vegetarian meals, if possible.  Eat seafood 2 or more times a week.  Have healthy snacks readily available, such as: ? Vegetable sticks with hummus. ? [place] yogurt. ? Fruit and nut trail mix.  Eat balanced meals throughout the week. This includes: ? Fruit: 2-3 servings a day ? Vegetables: 4-5 servings a day ? Low-fat dairy: 2 servings a  day ? Fish, poultry, or lean meat: 1 serving a day ? Beans and legumes: 2 or more servings a week ? Nuts and seeds: 1-2 servings a day ? Whole grains: 6-8 servings a day ? Extra-virgin olive oil: 3-4 servings a day  Limit red meat and sweets to only a few servings a month What are my food choices?  Mediterranean diet ? Recommended  Grains: Whole-grain pasta. Brown rice. Bulgar wheat. Polenta. Couscous. Whole-wheat bread. [place].  Vegetables: Artichokes. Beets. Broccoli. Cabbage. Carrots. Eggplant. Green beans. Chard. Kale. Spinach. Onions. Leeks. Peas. Squash. Tomatoes. Peppers. Radishes.  Fruits: Apples. Apricots. Avocado. Berries. Bananas. Cherries. Dates. Figs. Grapes. Lemons. Melon. Oranges. Peaches. Plums. Pomegranate.  Meats and other protein foods: Beans. Almonds. Sunflower seeds. Pine nuts. Peanuts. [place]. Salmon. Scallops. Shrimp. [place]. Tilapia. Clams. Oysters. Eggs.  Dairy: Low-fat milk. Cheese. Greek yogurt.  Beverages: Water. Red wine. Herbal tea.  Fats and oils: Extra virgin olive oil. Avocado oil. Grape seed oil.  Sweets and desserts: [place] yogurt with honey. Baked apples. Poached pears. Trail mix.  Seasoning and other foods: Basil. Cilantro. Coriander. Cumin. Mint. Parsley. Sage. Rosemary. Tarragon. Garlic. Oregano. Thyme. Pepper. Balsalmic vinegar. Tahini. Hummus. Tomato sauce. Olives. Mushrooms. ? Limit these  Grains: Prepackaged pasta or rice dishes. Prepackaged cereal with added sugar.  Vegetables: Deep fried potatoes (french fries).  Fruits: Fruit canned in syrup.  Meats and other protein foods: Beef. Pork. Lamb. Poultry with skin. Hot dogs. [place] Salines.  Dairy: Ice cream. Sour cream. Whole milk.  Beverages: Juice. Sugar-sweetened soft drinks. Beer. Liquor and spirits.  Fats and oils: Butter. Canola oil. Vegetable oil. Beef fat (tallow). Lard.  Sweets and desserts: Cookies. Cakes. Pies. Candy.  Seasoning and other foods: Mayonnaise. Premade sauces  and marinades. The items listed may not be a complete list. Talk with your dietitian about what dietary choices are right for you. Summary  The Mediterranean diet includes both food and lifestyle choices.  Eat a variety of fresh fruits and vegetables, beans, nuts, seeds, and whole grains.  Limit the amount of red meat and sweets that you eat.  Talk with your health care provider about whether it is safe for you to drink red wine in moderation. This means 1 glass a day for nonpregnant women and 2 glasses a day for men. A glass of wine equals 5 oz (150 mL). This information is not intended to replace advice given to you by your health care provider. Make sure you discuss any questions you have with your health care provider. Document Released: 09/23/2015 Document Revised: 09/30/2015 Document Reviewed: 09/23/2015 Elsevier Patient Education  2020 [employer], Daune Perch, NP  11/08/2018 5:05 PM    [place]

## 2018-11-08 NOTE — Patient Instructions (Addendum)
Medication Instructions:  Your physician recommends that you continue on your current medications as directed. Please refer to the Current Medication list given to you today.  If you need a refill on your cardiac medications before your next appointment, please call your pharmacy.   Lab work: LIPIDS AND CMET TODAY   If you have labs (blood work) drawn today and your tests are completely normal, you will receive your results only by: [name] Kitchen MyChart Message (if you have MyChart) OR . A paper copy in the mail If you have any lab test that is abnormal or we need to change your treatment, we will call you to review the results.  Testing/Procedures: NONE  Follow-Up: At [facility], you and your health needs are our priority.  As part of our continuing mission to provide you with exceptional heart care, we have created designated Provider Care Teams.  These Care Teams include your primary Cardiologist (physician) and Advanced Practice Providers (APPs -  Physician Assistants and Nurse Practitioners) who all work together to provide you with the care you need, when you need it. You will need a follow up appointment in:  1 years.  Please call our office 2 months in advance to schedule this appointment.  You may see Mertie Moores, MD or one of the following Advanced Practice Providers on your designated Care Team: Richardson Dopp, PA-C [place], [place] . Daune Perch, NP  Any Other Special Instructions Will Be Listed Below (If Applicable).   Lifestyle Modifications to Prevent and Treat Heart Disease -Recommend heart healthy/Mediterranean diet, with whole grains, fruits, vegetables, fish, lean meats, nuts, olive oil and avocado oil.  -Limit salt intake to less than 2000 mg per day.  -Recommend moderate walking, starting slowly with a few minutes and working up to 3-5 times/week for 30-50 minutes each session. Aim for at least 150 minutes.week. Goal should be pace of 3 miles/hours, or walking 1.5 miles  in 30 minutes -Recommend avoidance of tobacco products. Avoid excess alcohol. -Keep blood pressure well controlled, ideally less than 130/80.  =======================================   Mediterranean Diet A Mediterranean diet refers to food and lifestyle choices that are based on the traditions of countries located on the [employer]. This way of eating has been shown to help prevent certain conditions and improve outcomes for people who have chronic diseases, like kidney disease and heart disease. What are tips for following this plan? Lifestyle  Cook and eat meals together with your family, when possible.  Drink enough fluid to keep your urine clear or pale yellow.  Be physically active every day. This includes: ? Aerobic exercise like running or swimming. ? Leisure activities like gardening, walking, or housework.  Get 7-8 hours of sleep each night.  If recommended by your health care provider, drink red wine in moderation. This means 1 glass a day for nonpregnant women and 2 glasses a day for men. A glass of wine equals 5 oz (150 mL). Reading food labels   Check the serving size of packaged foods. For foods such as rice and pasta, the serving size refers to the amount of cooked product, not dry.  Check the total fat in packaged foods. Avoid foods that have saturated fat or trans fats.  Check the ingredients list for added sugars, such as corn syrup. Shopping  At the grocery store, buy most of your food from the areas near the walls of the store. This includes: ? Fresh fruits and vegetables (produce). ? Grains, beans, nuts, and seeds.  Some of these may be available in unpackaged forms or large amounts (in bulk). ? Fresh seafood. ? Poultry and eggs. ? Low-fat dairy products.  Buy whole ingredients instead of prepackaged foods.  Buy fresh fruits and vegetables in-season from local farmers markets.  Buy frozen fruits and vegetables in resealable bags.  If you do not  have access to quality fresh seafood, buy precooked frozen shrimp or canned fish, such as tuna, salmon, or sardines.  Buy small amounts of raw or cooked vegetables, salads, or olives from the deli or salad bar at your store.  Stock your pantry so you always have certain foods on hand, such as olive oil, canned tuna, canned tomatoes, rice, pasta, and beans. Cooking  Cook foods with extra-virgin olive oil instead of using butter or other vegetable oils.  Have meat as a side dish, and have vegetables or grains as your main dish. This means having meat in small portions or adding small amounts of meat to foods like pasta or stew.  Use beans or vegetables instead of meat in common dishes like chili or lasagna.  Experiment with different cooking methods. Try roasting or broiling vegetables instead of steaming or sauteing them.  Add frozen vegetables to soups, stews, pasta, or rice.  Add nuts or seeds for added healthy fat at each meal. You can add these to yogurt, salads, or vegetable dishes.  Marinate fish or vegetables using olive oil, lemon juice, garlic, and fresh herbs. Meal planning   Plan to eat 1 vegetarian meal one day each week. Try to work up to 2 vegetarian meals, if possible.  Eat seafood 2 or more times a week.  Have healthy snacks readily available, such as: ? Vegetable sticks with hummus. ? [place] yogurt. ? Fruit and nut trail mix.  Eat balanced meals throughout the week. This includes: ? Fruit: 2-3 servings a day ? Vegetables: 4-5 servings a day ? Low-fat dairy: 2 servings a day ? Fish, poultry, or lean meat: 1 serving a day ? Beans and legumes: 2 or more servings a week ? Nuts and seeds: 1-2 servings a day ? Whole grains: 6-8 servings a day ? Extra-virgin olive oil: 3-4 servings a day  Limit red meat and sweets to only a few servings a month What are my food choices?  Mediterranean diet ? Recommended  Grains: Whole-grain pasta. Brown rice. Bulgar wheat.  Polenta. Couscous. Whole-wheat bread. [place].  Vegetables: Artichokes. Beets. Broccoli. Cabbage. Carrots. Eggplant. Green beans. Chard. Kale. Spinach. Onions. Leeks. Peas. Squash. Tomatoes. Peppers. Radishes.  Fruits: Apples. Apricots. Avocado. Berries. Bananas. Cherries. Dates. Figs. Grapes. Lemons. Melon. Oranges. Peaches. Plums. Pomegranate.  Meats and other protein foods: Beans. Almonds. Sunflower seeds. Pine nuts. Peanuts. [place]. Salmon. Scallops. Shrimp. [place]. Tilapia. Clams. Oysters. Eggs.  Dairy: Low-fat milk. Cheese. Greek yogurt.  Beverages: Water. Red wine. Herbal tea.  Fats and oils: Extra virgin olive oil. Avocado oil. Grape seed oil.  Sweets and desserts: [place] yogurt with honey. Baked apples. Poached pears. Trail mix.  Seasoning and other foods: Basil. Cilantro. Coriander. Cumin. Mint. Parsley. Sage. Rosemary. Tarragon. Garlic. Oregano. Thyme. Pepper. Balsalmic vinegar. Tahini. Hummus. Tomato sauce. Olives. Mushrooms. ? Limit these  Grains: Prepackaged pasta or rice dishes. Prepackaged cereal with added sugar.  Vegetables: Deep fried potatoes (french fries).  Fruits: Fruit canned in syrup.  Meats and other protein foods: Beef. Pork. Lamb. Poultry with skin. Hot dogs. Bernie[place] Salines.  Dairy: Ice cream. Sour cream. Whole milk.  Beverages: Juice. Sugar-sweetened soft drinks. Beer.  Liquor and spirits.  Fats and oils: Butter. Canola oil. Vegetable oil. Beef fat (tallow). Lard.  Sweets and desserts: Cookies. Cakes. Pies. Candy.  Seasoning and other foods: Mayonnaise. Premade sauces and marinades. The items listed may not be a complete list. Talk with your dietitian about what dietary choices are right for you. Summary  The Mediterranean diet includes both food and lifestyle choices.  Eat a variety of fresh fruits and vegetables, beans, nuts, seeds, and whole grains.  Limit the amount of red meat and sweets that you eat.  Talk with your health care provider about  whether it is safe for you to drink red wine in moderation. This means 1 glass a day for nonpregnant women and 2 glasses a day for men. A glass of wine equals 5 oz (150 mL). This information is not intended to replace advice given to you by your health care provider. Make sure you discuss any questions you have with your health care provider. Document Released: 09/23/2015 Document Revised: 09/30/2015 Document Reviewed: 09/23/2015 Elsevier Patient Education  2020 [employer].

## 2018-11-09 ENCOUNTER — Other Ambulatory Visit: Payer: Self-pay | Admitting: Adult Health

## 2018-11-09 ENCOUNTER — Other Ambulatory Visit: Payer: Self-pay | Admitting: Cardiovascular Disease

## 2018-11-09 LAB — COMPREHENSIVE METABOLIC PANEL
ALT: 43 IU/L (ref 0–44)
AST: 45 IU/L — ABNORMAL HIGH (ref 0–40)
Albumin/Globulin Ratio: 1.6 (ref 1.2–2.2)
Albumin: 4.5 g/dL (ref 3.7–4.7)
Alkaline Phosphatase: 77 IU/L (ref 39–117)
BUN/Creatinine Ratio: 15 (ref 10–24)
BUN: 17 mg/dL (ref 8–27)
Bilirubin Total: 0.5 mg/dL (ref 0.0–1.2)
CO2: 25 mmol/L (ref 20–29)
Calcium: 9.9 mg/dL (ref 8.6–10.2)
Chloride: 100 mmol/L (ref 96–106)
Creatinine, Ser: 1.17 mg/dL (ref 0.76–1.27)
GFR calc Af Amer: 72 mL/min/{1.73_m2} (ref >59)
GFR calc non Af Amer: 62 mL/min/{1.73_m2} (ref >59)
Globulin, Total: 2.8 g/dL (ref 1.5–4.5)
Glucose: 125 mg/dL — ABNORMAL HIGH (ref 65–99)
Potassium: 4.2 mmol/L (ref 3.5–5.2)
Sodium: 139 mmol/L (ref 134–144)
Total Protein: 7.3 g/dL (ref 6.0–8.5)

## 2018-11-09 LAB — LIPID PANEL
Chol/HDL Ratio: 2 ratio (ref 0.0–5.0)
Cholesterol, Total: 193 mg/dL (ref 100–199)
HDL: 98 mg/dL (ref >39)
LDL Chol Calc (NIH): 81 mg/dL (ref 0–99)
Triglycerides: 75 mg/dL (ref 0–149)
VLDL Cholesterol Cal: 14 mg/dL (ref 5–40)

## 2018-11-12 NOTE — Telephone Encounter (Signed)
Denied.  Needs a1c and appointment.

## 2018-11-19 ENCOUNTER — Telehealth: Payer: Self-pay | Admitting: Cardiology

## 2018-11-19 NOTE — Telephone Encounter (Signed)
Patient returned call for his lab results.  

## 2018-11-21 ENCOUNTER — Encounter: Payer: Self-pay | Admitting: Family Medicine

## 2018-11-26 ENCOUNTER — Telehealth (INDEPENDENT_AMBULATORY_CARE_PROVIDER_SITE_OTHER): Payer: Medicare Other | Admitting: Adult Health

## 2018-11-26 ENCOUNTER — Other Ambulatory Visit: Payer: Self-pay

## 2018-11-26 DIAGNOSIS — K119 Disease of salivary gland, unspecified: Secondary | ICD-10-CM

## 2018-11-26 NOTE — Progress Notes (Signed)
Virtual Visit via Telephone Note  I connected with [name] on 11/26/18 at  1:30 PM EDT by telephone and verified that I am speaking with the correct person using two identifiers.   I discussed the limitations, risks, security and privacy concerns of performing an evaluation and management service by telephone and the availability of in person appointments. I also discussed with the patient that there may be a patient responsible charge related to this service. The patient expressed understanding and agreed to proceed.  Location patient: home Location provider: work or home office Participants present for the call: patient, provider Patient did not have a visit in the prior 7 days to address this/these issue(s).   History of Present Illness: Patient is being seen today for an acute issue.  His symptoms have been present for 5 days.  He reports a swollen "lump" underneath his chin and he feels it under his tongue.  Swelling becomes worse and experiences discomfort while eating, which causes some discomfort with swallowing.  The lump feels "hard".  The swelling usually retreats slightly after meals.  He denies fevers or chills and does not feel acutely ill.  Does report having a foul taste in his mouth when he wakes up from time to time but nothing during the day   Observations/Objective: Patient sounds cheerful and well on the phone. I do not appreciate any SOB. Speech and thought processing are grossly intact. Patient reported vitals:  Assessment and Plan:  1. Salivary gland disorder History of uncontrolled diabetes - cannot r/o  Sialosis  -Advised to suck on sour candies, drink 8 to 10 glasses of water a day, use warm compress and gentle massage.  If no improvement in the next 3 to 5 days and follow-up in the office - May need to refer to ENT    Follow Up Instructions:  I did not refer this patient for an OV in the next 24 hours for this/these issue(s).  I discussed the  assessment and treatment plan with the patient. The patient was provided an opportunity to ask questions and all were answered. The patient agreed with the plan and demonstrated an understanding of the instructions.   The patient was advised to call back or seek an in-person evaluation if the symptoms worsen or if the condition fails to improve as anticipated.  I provided 15 minutes of non-face-to-face time during this encounter.   [name], NP

## 2018-12-15 ENCOUNTER — Other Ambulatory Visit: Payer: Self-pay | Admitting: Adult Health

## 2019-01-01 LAB — HM DIABETES EYE EXAM

## 2019-01-19 ENCOUNTER — Other Ambulatory Visit: Payer: Self-pay | Admitting: Adult Health

## 2019-01-19 DIAGNOSIS — E1140 Type 2 diabetes mellitus with diabetic neuropathy, unspecified: Secondary | ICD-10-CM

## 2019-01-19 DIAGNOSIS — IMO0002 Reserved for concepts with insufficient information to code with codable children: Secondary | ICD-10-CM

## 2019-01-21 NOTE — Telephone Encounter (Signed)
Denied.  Needs A1C and follow up.

## 2019-01-27 ENCOUNTER — Ambulatory Visit (INDEPENDENT_AMBULATORY_CARE_PROVIDER_SITE_OTHER): Payer: Medicare Other

## 2019-01-27 VITALS — Ht 70.0 in | Wt 194.0 lb

## 2019-01-27 DIAGNOSIS — Z0000 Encounter for general adult medical examination without abnormal findings: Secondary | ICD-10-CM

## 2019-01-27 NOTE — Progress Notes (Signed)
This visit is being conducted via phone call due to the COVID-19 pandemic. This patient has given me verbal consent via phone to conduct this visit, patient states they are participating from their home address. Some vital signs may be absent or patient reported.   Patient identification: identified by name, DOB, and current address.  Location provider: [facility], Office Persons participating in the virtual visit: Mr. Gerrel Salmela and Franne Forts, LPN.    Subjective:   [name] is a 73 y.o. male who presents for Medicare Annual/Subsequent preventive examination.  Review of Systems:  Mr. Rosauer wife passed away a few months ago and 2 of his daughters are living with him. 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 He states his "daughters took all of the sweets out of the house" and he no longer is eating sweets. He states his blood sugars have recently been 120-130. He walks several days per week and also walks a good bit at work.  Cardiac Risk Factors include: advanced age (>41men, >26 women);diabetes mellitus;dyslipidemia;hypertension;male gender;sedentary lifestyle    Objective:    Vitals: Ht 5\' 10"  (1.778 m)   Wt 194 lb (88 kg)   BMI 27.84 kg/m   Body mass index is 27.84 kg/m.  Advanced Directives 01/27/2019 02/01/2016 12/05/2015 11/30/2015 11/24/2015 11/24/2015 11/13/2015  Does Patient Have a Medical Advance Directive? No No No No No No No  Would patient like information on creating a medical advance directive? Yes (MAU/Ambulatory/Procedural Areas - Information given) - No - patient declined information No - patient declined information No - patient declined information No - patient declined information -    Tobacco Social History   Tobacco Use  Smoking Status Never Smoker  Smokeless Tobacco Never Used     Counseling given: Not Answered   Clinical Intake:  Pre-visit preparation completed: Yes  Pain : No/denies pain    Nutritional Status:  BMI 25 -29 Overweight Nutritional Risks: None Diabetes: Yes CBG done?: (patient reports blood sugars 120-130 recently and denies hypoglycemic episodes)  How often do you need to have someone help you when you read instructions, pamphlets, or other written materials from your doctor or pharmacy?: 1 - Never What is the last grade level you completed in school?: 10th grade  Interpreter Needed?: No  Information entered by :: Franne Forts, LPN.  Past Medical History:  Diagnosis Date  . 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 Carpal tunnel syndrome 09/24/2014   Bilateral  . 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 Diabetes mellitus, type 2 ([place])   . Erectile dysfunction   . Essential hypertension   . Hyperlipidemia    Past Surgical History:  Procedure Laterality Date  . AORTIC VALVE REPLACEMENT N/A 10/29/2015   Procedure: AORTIC VALVE REPLACEMENT (AVR) WITH 23MM MAGNA EASE TISSUE VALVE.;  Surgeon: [name], MD;  Location: [place];  Service: Open Heart Surgery;  Laterality: N/A;  . CARDIAC CATHETERIZATION N/A 10/27/2015   Procedure: Left Heart Cath and Coronary Angiography;  Surgeon: [name], MD;  Location: [facility];  Service: Cardiovascular;  Laterality: N/A;  . CORONARY ARTERY BYPASS GRAFT N/A 10/29/2015   Procedure: CORONARY ARTERY BYPASS GRAFTING (CABG) x Four UTILIZING THE LEFT INTERNAL MAMMARY ARTERY AND ENDOSCOPICALLY HARVESTED RIGHT SAPEHENEOUS VEINS.;  Surgeon: [name], MD;  Location: [place];  Service: Open Heart Surgery;  Laterality: N/A;  . CYST  REMOVAL HAND Right   . ESOPHAGOGASTRODUODENOSCOPY N/A 12/02/2015   Procedure: ESOPHAGOGASTRODUODENOSCOPY (EGD);  Surgeon: [name], MD;  Location: [place];  Service: Gastroenterology;  Laterality: N/A;  . REPLACEMENT ASCENDING AORTA N/A 10/29/2015   Procedure: REPLACEMENT OF ASCENDING AORTA USING 34MM X 30CM WOVEN DOUBLE  VELOUR VASCULAR GRAFT.;  Surgeon: [name], MD;  Location: [place];  Service: Open Heart Surgery;  Laterality: N/A;  . TEE WITHOUT CARDIOVERSION N/A 10/29/2015   Procedure: TRANSESOPHAGEAL ECHOCARDIOGRAM (TEE);  Surgeon: [name], MD;  Location: [facility];  Service: Open Heart Surgery;  Laterality: N/A;  . UVULECTOMY N/A 12/10/2015   Procedure: UVULECTOMY;  Surgeon: [name], MD;  Location: [facility];  Service: ENT;  Laterality: N/A;   Family History  Problem Relation Age of Onset  . Cancer Father        Throat cancer  . Diabetes Sister    Social History   Socioeconomic History  . Marital status: Widowed    Spouse name: Not on file  . Number of children: 5  . Years of education: 10   . Highest education level: 10th grade  Occupational History  . Occupation: custodian    Comment: PT  Tobacco Use  . Smoking status: Never Smoker  . Smokeless tobacco: Never Used  Substance and Sexual Activity  . Alcohol use: Yes    Alcohol/week: 1.0 standard drinks    Types: 1 Glasses of wine per week    Comment: 1 x month  . Drug use: No  . Sexual activity: Yes  Other Topics Concern  . Not on file  Social History Narrative   Widowed to [name] for 82 years; 2 daughters liver with him now   Works part time as Chiropodist   5 children; 1 son deceased   Never Smoked   Alcohol use- no   Caffeine use: occasional       Lost one child a few months ago    Lost his sister, then son and then nephew - all within 2 weeks apart   Son had a stroke at 59    Was planning to get married last month in Feb.    Social Determinants of Health   Financial Resource Strain:   . Difficulty of Paying Living Expenses: Not on file  Food Insecurity:   . Worried About Charity fundraiser in the Last Year: Not on file  . Ran Out of Food in the Last Year: Not on file  Transportation Needs:   . Lack of Transportation (Medical): Not on file  . Lack of Transportation (Non-Medical): Not  on file  Physical Activity:   . Days of Exercise per Week: Not on file  . Minutes of Exercise per Session: Not on file  Stress:   . Feeling of Stress : Not on file  Social Connections:   . Frequency of Communication with Friends and Family: Not on file  . Frequency of Social Gatherings with Friends and Family: Not on file  . Attends Religious Services: Not on file  . Active Member of Clubs or Organizations: Not on file  . Attends Archivist Meetings: Not on file  . Marital Status: Not on file    Outpatient Encounter Medications as of 01/27/2019  Medication Sig  . aspirin EC 81 MG tablet Take 81 mg by mouth daily.  . B-D ULTRAFINE III SHORT PEN 31G X 8 MM MISC USE 4 TIMES DAILY AS DIRECTED  . glipiZIDE (GLUCOTROL  XL) 10 MG 24 hr tablet TAKE 1 TABLET (10 MG TOTAL) BY MOUTH DAILY WITH BREAKFAST.  . hydrochlorothiazide (HYDRODIURIL) 25 MG tablet Take 1 tablet (25 mg total) by mouth daily.  . Insulin Glargine ([name] KWIKPEN) 100 UNIT/ML SOPN INJECT 0.23 MLS (23 UNITS TOTAL) INTO THE SKIN AT BEDTIME. (Patient taking differently: Inject 25 Units into the skin at bedtime. )  . metFORMIN (GLUCOPHAGE) 1000 MG tablet TAKE 1 TABLET (1,000 MG TOTAL) BY MOUTH 2 (TWO) TIMES DAILY WITH A MEAL.  . metoprolol tartrate (LOPRESSOR) 25 MG tablet TAKE 3 TABLETS BY MOUTH TWICE A DAY  . ONETOUCH VERIO test strip TEST BLOOD SUGARS DAILY. DX: E11.9  . potassium chloride (K-DUR) 10 MEQ tablet 1 TAB EVERY DAY  . rosuvastatin (CRESTOR) 20 MG tablet TAKE 1 TABLET BY MOUTH EVERY DAY   No facility-administered encounter medications on file as of 01/27/2019.    Activities of Daily Living In your present state of health, do you have any difficulty performing the following activities: 01/27/2019  Hearing? N  Vision? N  Difficulty concentrating or making decisions? N  Walking or climbing stairs? N  Dressing or bathing? N  Doing errands, shopping? N  Preparing Food and eating ? N  Using the Toilet? N   In the past six months, have you accidently leaked urine? N  Do you have problems with loss of bowel control? N  Managing your Medications? N  Managing your Finances? N  Housekeeping or managing your Housekeeping? N  Some recent data might be hidden    Patient Care Team: [name], NP as PCP - General (Family Medicine) [name], [name], MD as PCP - Cardiology (Cardiology) Alda Berthold, DO as Consulting Physician (Neurology) Berle Mull, MD as Consulting Physician (Family Medicine)   Assessment:   This is a routine wellness examination for [place].  Exercise Activities and Dietary recommendations Current Exercise Habits: Home exercise routine(walks 3-4 times per week), Type of exercise: walking, Time (Minutes): 60, Frequency (Times/Week): 4, Weekly Exercise (Minutes/Week): 240, Intensity: Moderate, Exercise limited by: None identified  Goals    . Patient Stated     Keep walking and maintain health        Fall Risk Fall Risk  01/27/2019 04/24/2017 04/24/2017 04/03/2014 03/03/2013  Falls in the past year? 0 No No No No   Is the patient's home free of loose throw rugs in walkways, pet beds, electrical cords, etc?   yes      Grab bars in the bathroom? yes      Handrails on the stairs?   yes      Adequate lighting?   yes  Timed Get Up and Go Performed: N/A due to telephone   Depression Screen PHQ 2/9 Scores 01/27/2019 04/24/2017 04/24/2017 05/19/2016  PHQ - 2 Score 0 0 0 0    Cognitive Function MMSE - Mini Mental State Exam 04/24/2017  Not completed: (No Data)     6CIT Screen 01/27/2019  What Year? 0 points  What month? 0 points  What time? 0 points  Count back from 20 0 points  Months in reverse 0 points  Repeat phrase 0 points  Total Score 0    Immunization History  Administered Date(s) Administered  . Influenza Whole 03/15/2009, 04/06/2010  . Influenza, High Dose Seasonal PF 12/29/2013, 12/25/2014, 04/24/2017, 01/16/2018  . Influenza,inj,Quad PF,6+ Mos  11/12/2012, 11/25/2015  . Pneumococcal Conjugate-13 04/24/2014  . Pneumococcal Polysaccharide-23 12/13/2007, 11/25/2015  . Td 09/13/2008    Qualifies for Shingles  Vaccine? Yes, Patient will check on cost at his pharmacy  Screening Tests Health Maintenance  Topic Date Due  . FOOT EXAM  04/25/2018  . URINE MICROALBUMIN  04/25/2018  . INFLUENZA VACCINE  09/14/2018  . TETANUS/TDAP  09/14/2018  . HEMOGLOBIN A1C  03/30/2019  . OPHTHALMOLOGY EXAM  06/28/2019  . COLONOSCOPY  09/01/2020  . Hepatitis C Screening  Completed  . PNA vac Low Risk Adult  Completed   Cancer Screenings: Lung: Low Dose CT Chest recommended if Age 25-80 years, 30 pack-year currently smoking OR have quit w/in 15years. Patient does not qualify. Colorectal: Yes  Additional Screenings:  Hepatitis C Screening: completed 11/19/2015      Plan:   Mr. Martines is going to check with pharmacy for out of pocket costs for shingrix vaccine. He was instructed to make an appointment with PCP for a follow up on diabetes with a diabetic foot exam.   I have personally reviewed and noted the following in the patient's chart:   . Medical and social history . Use of alcohol, tobacco or illicit drugs  . Current medications and supplements . Functional ability and status . Nutritional status . Physical activity . Advanced directives . List of other physicians . Hospitalizations, surgeries, and ER visits in previous 12 months . Vitals . Screenings to include cognitive, depression, and falls . Referrals and appointments  In addition, I have reviewed and discussed with patient certain preventive protocols, quality metrics, and best practice recommendations. A written personalized care plan for preventive services as well as general preventive health recommendations were provided to patient.     Franne Forts, LPN  [id number]

## 2019-01-27 NOTE — Patient Instructions (Signed)
[name] , Thank you for taking time to participate in your Medicare Wellness Visit. I appreciate your ongoing commitment to your health goals. Please review the following plan we discussed and let me know if I can assist you in the future.   Screening recommendations/referrals: Colorectal Screening: performed 09/02/2015; up to date; due again 09/02/2020.  Vision and Dental Exams: Recommended annual ophthalmology exams for early detection of glaucoma and other disorders of the eye Recommended annual dental exams for proper oral hygiene. Patient states he had annual eye exam 2-3 weeks ago.  Diabetic Exams: Diabetic Eye Exam: Up to date Diabetic Foot Exam: Past due; will be done at next PCP appointment  Vaccinations: Influenza vaccine: past due; patient can make nurse appointment here at PCP office or have done at pharmacy. Pneumococcal vaccine: completed 3/112016 & 11/25/2015. Up to date. Tdap vaccine: Last one 09/13/2008; DUE NOW. Please check cost at local pharmacy as this may be cheapest option. Shingles vaccine: Please contact your ipharmacy to determine your out of pocket expense for the Shingrix vaccine. You may receive this vaccine at your local pharmacy (series of 2 injections).  Advanced directives: Advance directives discussed with you today. I have provided a copy for you to complete at home and have notarized. Once this is complete please bring a copy in to our office so we can scan it into your chart.   Goals: Recommend to drink at least 6-8 8oz glasses of water per day. Recommend to exercise for at least 150 minutes per week. Recommend to remove any items from the home that may cause slips or trips. Recommend to begin low sodium diet as directed below   Next appointment: Please schedule your Annual Wellness Visit with your Nurse Health Advisor in one year.  Preventive Care 3 Years and Older, Male Preventive care refers to lifestyle choices and visits with your health care  provider that can promote health and wellness. What does preventive care include?  A yearly physical exam. This is also called an annual well check.  Dental exams once or twice a year.  Routine eye exams. Ask your health care provider how often you should have your eyes checked.  Personal lifestyle choices, including:  Daily care of your teeth and gums.  Regular physical activity.  Eating a healthy diet.  Avoiding tobacco and drug use.  Limiting alcohol use.  Practicing safe sex.  Taking low doses of aspirin every day if recommended by your health care provider..  Taking vitamin and mineral supplements as recommended by your health care provider. What happens during an annual well check? The services and screenings done by your health care provider during your annual well check will depend on your age, overall health, lifestyle risk factors, and family history of disease. Counseling  Your health care provider may ask you questions about your:  Alcohol use.  Tobacco use.  Drug use.  Emotional well-being.  Home and relationship well-being.  Sexual activity.  Eating habits.  History of falls.  Memory and ability to understand (cognition).  Work and work Statistician. Screening  You may have the following tests or measurements:  Height, weight, and BMI.  Blood pressure.  Lipid and cholesterol levels. These may be checked every 5 years, or more frequently if you are over 21 years old.  Skin check.  Lung cancer screening. You may have this screening every year starting at age 19 if you have a 30-pack-year history of smoking and currently smoke or have quit within the  past 15 years.  Fecal occult blood test (FOBT) of the stool. You may have this test every year starting at age 18.  Flexible sigmoidoscopy or colonoscopy. You may have a sigmoidoscopy every 5 years or a colonoscopy every 10 years starting at age 25.  Prostate cancer screening. Recommendations  will vary depending on your family history and other risks.  Hepatitis C blood test.  Hepatitis B blood test.  Sexually transmitted disease (STD) testing.  Diabetes screening. This is done by checking your blood sugar (glucose) after you have not eaten for a while (fasting). You may have this done every 1-3 years.  Abdominal aortic aneurysm (AAA) screening. You may need this if you are a current or former smoker.  Osteoporosis. You may be screened starting at age 58 if you are at high risk. Talk with your health care provider about your test results, treatment options, and if necessary, the need for more tests. Vaccines  Your health care provider may recommend certain vaccines, such as:  Influenza vaccine. This is recommended every year.  Tetanus, diphtheria, and acellular pertussis (Tdap, Td) vaccine. You may need a Td booster every 10 years.  Zoster vaccine. You may need this after age 27.  Pneumococcal 13-valent conjugate (PCV13) vaccine. One dose is recommended after age 52.  Pneumococcal polysaccharide (PPSV23) vaccine. One dose is recommended after age 71. Talk to your health care provider about which screenings and vaccines you need and how often you need them. This information is not intended to replace advice given to you by your health care provider. Make sure you discuss any questions you have with your health care provider. Document Released: 02/26/2015 Document Revised: 10/20/2015 Document Reviewed: 12/01/2014 Elsevier Interactive Patient Education  2017 [employer] Prevention in the Home Falls can cause injuries. They can happen to people of all ages. There are many things you can do to make your home safe and to help prevent falls. What can I do on the outside of my home?  Regularly fix the edges of walkways and driveways and fix any cracks.  Remove anything that might make you trip as you walk through a door, such as a raised step or threshold.  Trim  any bushes or trees on the path to your home.  Use bright outdoor lighting.  Clear any walking paths of anything that might make someone trip, such as rocks or tools.  Regularly check to see if handrails are loose or broken. Make sure that both sides of any steps have handrails.  Any raised decks and porches should have guardrails on the edges.  Have any leaves, snow, or ice cleared regularly.  Use sand or salt on walking paths during winter.  Clean up any spills in your garage right away. This includes oil or grease spills. What can I do in the bathroom?  Use night lights.  Install grab bars by the toilet and in the tub and shower. Do not use towel bars as grab bars.  Use non-skid mats or decals in the tub or shower.  If you need to sit down in the shower, use a plastic, non-slip stool.  Keep the floor dry. Clean up any water that spills on the floor as soon as it happens.  Remove soap buildup in the tub or shower regularly.  Attach bath mats securely with double-sided non-slip rug tape.  Do not have throw rugs and other things on the floor that can make you trip. What can I do in  the bedroom?  Use night lights.  Make sure that you have a light by your bed that is easy to reach.  Do not use any sheets or blankets that are too big for your bed. They should not hang down onto the floor.  Have a firm chair that has side arms. You can use this for support while you get dressed.  Do not have throw rugs and other things on the floor that can make you trip. What can I do in the kitchen?  Clean up any spills right away.  Avoid walking on wet floors.  Keep items that you use a lot in easy-to-reach places.  If you need to reach something above you, use a strong step stool that has a grab bar.  Keep electrical cords out of the way.  Do not use floor polish or wax that makes floors slippery. If you must use wax, use non-skid floor wax.  Do not have throw rugs and other  things on the floor that can make you trip. What can I do with my stairs?  Do not leave any items on the stairs.  Make sure that there are handrails on both sides of the stairs and use them. Fix handrails that are broken or loose. Make sure that handrails are as long as the stairways.  Check any carpeting to make sure that it is firmly attached to the stairs. Fix any carpet that is loose or worn.  Avoid having throw rugs at the top or bottom of the stairs. If you do have throw rugs, attach them to the floor with carpet tape.  Make sure that you have a light switch at the top of the stairs and the bottom of the stairs. If you do not have them, ask someone to add them for you. What else can I do to help prevent falls?  Wear shoes that:  Do not have high heels.  Have rubber bottoms.  Are comfortable and fit you well.  Are closed at the toe. Do not wear sandals.  If you use a stepladder:  Make sure that it is fully opened. Do not climb a closed stepladder.  Make sure that both sides of the stepladder are locked into place.  Ask someone to hold it for you, if possible.  Clearly mark and make sure that you can see:  Any grab bars or handrails.  First and last steps.  Where the edge of each step is.  Use tools that help you move around (mobility aids) if they are needed. These include:  Canes.  Walkers.  Scooters.  Crutches.  Turn on the lights when you go into a dark area. Replace any light bulbs as soon as they burn out.  Set up your furniture so you have a clear path. Avoid moving your furniture around.  If any of your floors are uneven, fix them.  If there are any pets around you, be aware of where they are.  Review your medicines with your doctor. Some medicines can make you feel dizzy. This can increase your chance of falling. Ask your doctor what other things that you can do to help prevent falls. This information is not intended to replace advice given to  you by your health care provider. Make sure you discuss any questions you have with your health care provider. Document Released: 11/26/2008 Document Revised: 07/08/2015 Document Reviewed: 03/06/2014 Elsevier Interactive Patient Education  2017 [employer].

## 2019-01-31 ENCOUNTER — Other Ambulatory Visit: Payer: Self-pay | Admitting: Adult Health

## 2019-02-01 ENCOUNTER — Other Ambulatory Visit: Payer: Self-pay | Admitting: Cardiovascular Disease

## 2019-02-04 NOTE — Telephone Encounter (Signed)
Ok to increase to 150 mg tab, take 1/2 tab BID

## 2019-02-05 NOTE — Telephone Encounter (Signed)
No 150 mg tab available.  Sent in 50 mg and will have pt take 1.5 tabs.

## 2019-02-08 ENCOUNTER — Emergency Department (HOSPITAL_COMMUNITY)
Admission: EM | Admit: 2019-02-08 | Discharge: 2019-02-09 | Disposition: A | Discharge status: Home / Self Care | Payer: Medicare Other | Attending: Emergency Medicine | Admitting: Emergency Medicine

## 2019-02-08 ENCOUNTER — Other Ambulatory Visit: Payer: Self-pay

## 2019-02-08 ENCOUNTER — Encounter (HOSPITAL_COMMUNITY): Payer: Self-pay | Admitting: Emergency Medicine

## 2019-02-08 DIAGNOSIS — R05 Cough: Secondary | ICD-10-CM | POA: Diagnosis present

## 2019-02-08 DIAGNOSIS — I10 Essential (primary) hypertension: Secondary | ICD-10-CM | POA: Insufficient documentation

## 2019-02-08 DIAGNOSIS — Z951 Presence of aortocoronary bypass graft: Secondary | ICD-10-CM | POA: Insufficient documentation

## 2019-02-08 DIAGNOSIS — Z79899 Other long term (current) drug therapy: Secondary | ICD-10-CM | POA: Insufficient documentation

## 2019-02-08 DIAGNOSIS — Z794 Long term (current) use of insulin: Secondary | ICD-10-CM | POA: Insufficient documentation

## 2019-02-08 DIAGNOSIS — U071 COVID-19: Secondary | ICD-10-CM | POA: Diagnosis not present

## 2019-02-08 DIAGNOSIS — I2510 Atherosclerotic heart disease of native coronary artery without angina pectoris: Secondary | ICD-10-CM | POA: Insufficient documentation

## 2019-02-08 DIAGNOSIS — Z7982 Long term (current) use of aspirin: Secondary | ICD-10-CM | POA: Diagnosis not present

## 2019-02-08 DIAGNOSIS — E119 Type 2 diabetes mellitus without complications: Secondary | ICD-10-CM | POA: Insufficient documentation

## 2019-02-08 LAB — URINALYSIS, ROUTINE W REFLEX MICROSCOPIC
Bacteria, UA: NONE SEEN
Bilirubin Urine: NEGATIVE
Glucose, UA: >=500 mg/dL — AB
Hgb urine dipstick: NEGATIVE
Ketones, ur: NEGATIVE mg/dL
Leukocytes,Ua: NEGATIVE
Nitrite: NEGATIVE
Protein, ur: 100 mg/dL — AB
Specific Gravity, Urine: 1.029 (ref 1.005–1.030)
pH: 5 (ref 5.0–8.0)

## 2019-02-08 LAB — CBC WITH DIFFERENTIAL/PLATELET
Abs Immature Granulocytes: 0.01 10*3/uL (ref 0.00–0.07)
Basophils Absolute: 0 10*3/uL (ref 0.0–0.1)
Basophils Relative: 0 %
Eosinophils Absolute: 0 10*3/uL (ref 0.0–0.5)
Eosinophils Relative: 0 %
HCT: 40.7 % (ref 39.0–52.0)
Hemoglobin: 13.7 g/dL (ref 13.0–17.0)
Immature Granulocytes: 0 %
Lymphocytes Relative: 23 %
Lymphs Abs: 1.4 10*3/uL (ref 0.7–4.0)
MCH: 30.4 pg (ref 26.0–34.0)
MCHC: 33.7 g/dL (ref 30.0–36.0)
MCV: 90.4 fL (ref 80.0–100.0)
Monocytes Absolute: 0.6 10*3/uL (ref 0.1–1.0)
Monocytes Relative: 11 %
Neutro Abs: 4 10*3/uL (ref 1.7–7.7)
Neutrophils Relative %: 66 %
Platelets: 169 10*3/uL (ref 150–400)
RBC: 4.5 MIL/uL (ref 4.22–5.81)
RDW: 12.7 % (ref 11.5–15.5)
WBC: 6.1 10*3/uL (ref 4.0–10.5)
nRBC: 0 % (ref 0.0–0.2)

## 2019-02-08 MED ORDER — SODIUM CHLORIDE 0.9% FLUSH
3.0000 mL | Freq: Once | INTRAVENOUS | Status: AC
  Administered 2019-02-09: 3 mL via INTRAVENOUS

## 2019-02-08 MED ORDER — ACETAMINOPHEN 325 MG PO TABS
650.0000 mg | ORAL_TABLET | Freq: Once | ORAL | Status: DC
  Filled 2019-02-08: qty 2

## 2019-02-08 NOTE — ED Triage Notes (Signed)
Pt to ED with c/o elevated temp off and on today.  Also st's he has had a nonproductive cough x's 1 week

## 2019-02-09 ENCOUNTER — Emergency Department (HOSPITAL_COMMUNITY): Payer: Medicare Other

## 2019-02-09 LAB — POC SARS CORONAVIRUS 2 AG -  ED: SARS Coronavirus 2 Ag: POSITIVE — AB

## 2019-02-09 LAB — COMPREHENSIVE METABOLIC PANEL
ALT: 26 U/L (ref 0–44)
AST: 36 U/L (ref 15–41)
Albumin: 3.9 g/dL (ref 3.5–5.0)
Alkaline Phosphatase: 63 U/L (ref 38–126)
Anion gap: 10 (ref 5–15)
BUN: 15 mg/dL (ref 8–23)
CO2: 25 mmol/L (ref 22–32)
Calcium: 9 mg/dL (ref 8.9–10.3)
Chloride: 98 mmol/L (ref 98–111)
Creatinine, Ser: 1.28 mg/dL — ABNORMAL HIGH (ref 0.61–1.24)
GFR calc Af Amer: >60 mL/min (ref >60)
GFR calc non Af Amer: 56 mL/min — ABNORMAL LOW (ref >60)
Glucose, Bld: 318 mg/dL — ABNORMAL HIGH (ref 70–99)
Potassium: 4.8 mmol/L (ref 3.5–5.1)
Sodium: 133 mmol/L — ABNORMAL LOW (ref 135–145)
Total Bilirubin: 0.6 mg/dL (ref 0.3–1.2)
Total Protein: 7.5 g/dL (ref 6.5–8.1)

## 2019-02-09 LAB — LACTIC ACID, PLASMA
Lactic Acid, Venous: 2.4 mmol/L (ref 0.5–1.9)
Lactic Acid, Venous: 1.2 mmol/L (ref 0.5–1.9)

## 2019-02-09 LAB — SARS CORONAVIRUS 2 (TAT 6-24 HRS): SARS Coronavirus 2: POSITIVE — AB

## 2019-02-09 MED ORDER — SODIUM CHLORIDE 0.9 % IV BOLUS
1000.0000 mL | Freq: Once | INTRAVENOUS | Status: AC
  Administered 2019-02-09: 1000 mL via INTRAVENOUS

## 2019-02-09 NOTE — ED Provider Notes (Signed)
Care assumed from [place], PA-C at shift change with labs and pulse ox pending.  In brief, this patient is a 73 y.o. M who presents for evaluation of fever and cough.  No vomiting, diarrhea.  No shortness of breath, chest pain.  No known exposure to COVID-19.  Please see note from previous provider for full history/physical exam.    Physical Exam  BP 132/68   Pulse 63   Temp 98.5 F (36.9 C) (Oral)   Resp (!) 22   Ht 6' (1.829 m)   Wt 88 kg   SpO2 95%   BMI 26.31 kg/m   Physical Exam Please see previous provider's physical exam.   No distress.   ED Course/Procedures     Procedures  Results for orders placed or performed during the hospital encounter of 02/08/19 (from the past 24 hour(s))  Blood culture (routine x 2)     Status: None (Preliminary result)   Collection Time: 02/08/19 10:45 PM   Specimen: BLOOD RIGHT ARM  Result Value Ref Range   Specimen Description BLOOD RIGHT ARM    Special Requests      BOTTLES DRAWN AEROBIC AND ANAEROBIC Blood Culture results may not be optimal due to an excessive volume of blood received in culture bottles   Culture      NO GROWTH < 12 HOURS Performed at [facility] [address] [place], [place] [address]    Report Status PENDING   Urinalysis, Routine w reflex microscopic     Status: Abnormal   Collection Time: 02/08/19 10:58 PM  Result Value Ref Range   Color, Urine YELLOW YELLOW   APPearance HAZY (A) CLEAR   Specific Gravity, Urine 1.029 1.005 - 1.030   pH 5.0 5.0 - 8.0   Glucose, UA >=500 (A) NEGATIVE mg/dL   Hgb urine dipstick NEGATIVE NEGATIVE   Bilirubin Urine NEGATIVE NEGATIVE   Ketones, ur NEGATIVE NEGATIVE mg/dL   Protein, ur 100 (A) NEGATIVE mg/dL   Nitrite NEGATIVE NEGATIVE   Leukocytes,Ua NEGATIVE NEGATIVE   RBC / HPF 0-5 0 - 5 RBC/hpf   WBC, UA 0-5 0 - 5 WBC/hpf   Bacteria, UA NONE SEEN NONE SEEN   Squamous Epithelial / LPF 0-5 0 - 5   Mucus PRESENT   Blood culture (routine x 2)     Status:  None (Preliminary result)   Collection Time: 02/08/19 11:10 PM   Specimen: BLOOD RIGHT HAND  Result Value Ref Range   Specimen Description BLOOD RIGHT HAND    Special Requests      BOTTLES DRAWN AEROBIC ONLY Blood Culture adequate volume   Culture      NO GROWTH < 12 HOURS Performed at [facility], [address] [place], [place] [address]    Report Status PENDING   Lactic acid, plasma     Status: Abnormal   Collection Time: 02/08/19 11:23 PM  Result Value Ref Range   Lactic Acid, Venous 2.4 (HH) 0.5 - 1.9 mmol/L  Comprehensive metabolic panel     Status: Abnormal   Collection Time: 02/08/19 11:23 PM  Result Value Ref Range   Sodium 133 (L) 135 - 145 mmol/L   Potassium 4.8 3.5 - 5.1 mmol/L   Chloride 98 98 - 111 mmol/L   CO2 25 22 - 32 mmol/L   Glucose, Bld 318 (H) 70 - 99 mg/dL   BUN 15 8 - 23 mg/dL   Creatinine, Ser 1.28 (H) 0.61 - 1.24 mg/dL  Calcium 9.0 8.9 - 10.3 mg/dL   Total Protein 7.5 6.5 - 8.1 g/dL   Albumin 3.9 3.5 - 5.0 g/dL   AST 36 15 - 41 U/L   ALT 26 0 - 44 U/L   Alkaline Phosphatase 63 38 - 126 U/L   Total Bilirubin 0.6 0.3 - 1.2 mg/dL   GFR calc non Af Amer 56 (L) >60 mL/min   GFR calc Af Amer >60 >60 mL/min   Anion gap 10 5 - 15  CBC with Differential     Status: None   Collection Time: 02/08/19 11:23 PM  Result Value Ref Range   WBC 6.1 4.0 - 10.5 K/uL   RBC 4.50 4.22 - 5.81 MIL/uL   Hemoglobin 13.7 13.0 - 17.0 g/dL   HCT 40.7 39.0 - 52.0 %   MCV 90.4 80.0 - 100.0 fL   MCH 30.4 26.0 - 34.0 pg   MCHC 33.7 30.0 - 36.0 g/dL   RDW 12.7 11.5 - 15.5 %   Platelets 169 150 - 400 K/uL   nRBC 0.0 0.0 - 0.2 %   Neutrophils Relative % 66 %   Neutro Abs 4.0 1.7 - 7.7 K/uL   Lymphocytes Relative 23 %   Lymphs Abs 1.4 0.7 - 4.0 K/uL   Monocytes Relative 11 %   Monocytes Absolute 0.6 0.1 - 1.0 K/uL   Eosinophils Relative 0 %   Eosinophils Absolute 0.0 0.0 - 0.5 K/uL   Basophils Relative 0 %   Basophils Absolute 0.0 0.0 - 0.1 K/uL   Immature  Granulocytes 0 %   Abs Immature Granulocytes 0.01 0.00 - 0.07 K/uL  POC SARS Coronavirus 2 Ag-ED - Nasal Swab (BD Veritor Kit)     Status: Abnormal   Collection Time: 02/09/19  6:04 AM  Result Value Ref Range   SARS Coronavirus 2 Ag POSITIVE (A) NEGATIVE  Lactic acid, plasma     Status: None   Collection Time: 02/09/19  6:33 AM  Result Value Ref Range   Lactic Acid, Venous 1.2 0.5 - 1.9 mmol/L     MDM   PLAN: Patient work-up was overall reassuring.  His lactic was slightly elevated.  He is getting fluids and will repeat a lactic.  Additionally, plan to ambulate while testing pulse ox.  If lactic returns normal and ambulation with pulse ox shows no hypoxia, patient stable for discharge.  MDM:  Initial lactic was 2.4.  After liter fluid, it has improved to 1.2.  Patient ambulated with O2 sats between 97-100%.  At this time, given reassuring work-up, hemodynamic stability as follows lack of hypoxia, I feel that he is appropriate for discharge home. I discussed this personally with patient. He is agreeable.  Encouraged at home supportive care measures. At this time, patient exhibits no emergent life-threatening condition that require further evaluation in ED or admission. Patient had ample opportunity for questions and discussion. All patient's questions were answered with full understanding. Strict return precautions discussed. Patient expresses understanding and agreement to plan.   [name] was evaluated in Emergency Department on 02/09/2019 for the symptoms described in the history of present illness. He was evaluated in the context of the global COVID-19 pandemic, which necessitated consideration that the patient might be at risk for infection with the SARS-CoV-2 virus that causes COVID-19. Institutional protocols and algorithms that pertain to the evaluation of patients at risk for COVID-19 are in a state of rapid change based on information released by regulatory bodies including the  CDC  and federal and state organizations. These policies and algorithms were followed during the patient's care in the ED.  Portions of this note were generated with [employer]. Dictation errors may occur despite best attempts at proofreading.  1. COVID-19 virus infection        Volanda Napoleon, PA-C 02/09/19 0759    Lajean Saver, MD 02/09/19 1004
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
## 2019-02-09 NOTE — ED Notes (Signed)
Pt ambulated with pulse ox with no assistance, O2 sat remained between 97-100% while ambulating and after returning to bed. Pt did not complain of any shortness of breath.

## 2019-02-09 NOTE — ED Provider Notes (Signed)
[facility] EMERGENCY DEPARTMENT Provider Note   CSN: [id number] Arrival date & time: 02/08/19  2243     History Chief Complaint  [name] with  . Fever  . Cough    [name] is a 73 y.o. male.  [name] to the emergency department with a chief complaint of fever and cough.  [name] states that [name] began running a fever today.  [name] reports associated cough.  [name] denies that [name] nausea, vomiting, or diarrhea.  Denies any shortness of breath.  Denies any chest pain.  [name] has not taken anything for symptoms.  Denies any known exposures to coronavirus.  Denies any other associated symptoms.  The history is provided by the [name]. No language interpreter was used.       Past Medical History:  Diagnosis Date  . 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 Carpal tunnel syndrome 09/24/2014   Bilateral  . 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 Diabetes mellitus, type 2 ([place])   . Erectile dysfunction   . Essential hypertension   . Hyperlipidemia     [name] Active Problem List   Diagnosis Date Noted  . S/P AVR (aortic valve replacement) 11/14/2016  . 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 Uvular swelling 12/06/2015  . Tibial plateau fracture, left, closed, initial encounter 12/02/2015  . Coronary artery disease involving coronary bypass graft of native heart without angina pectoris   . PAF (paroxysmal atrial fibrillation) ([place]) 11/24/2015  . Orthostatic hypotension   . Fungus present in urine   . Hx of CABG (Sept 2017) 11/13/2015  . Atrial fibrillation (post op after CABG Sept 2017) 11/13/2015  . Orthostatic syncope 11/13/2015  . Elevated troponin 11/13/2015  . Coronary artery disease involving native coronary artery of native heart without angina pectoris   . Aortic stenosis s/p tissue valve (Sept 2017) 10/28/2015  . Atypical angina ([place]) 10/27/2015  . Abnormal  nuclear stress test 10/27/2015  . Coronary artery disease involving native heart with angina pectoris ([place]) 10/27/2015  . Aortic aneurysm ([place]) 07/23/2015  . Carpal tunnel syndrome 09/24/2014  . Left carotid bruit 05/07/2013  . Diabetic retinopathy ([place]) 09/23/2012  . Diminished pulses in lower extremity 06/17/2012  . Chest pain 03/22/2011  . Paresthesia of foot 11/04/2010  . Aortic valve disease 04/06/2010  . HYPERTROPHY PROSTATE W/UR OBST & OTH LUTS 03/15/2009  . Hyperlipidemia 12/13/2007  . Type II diabetes mellitus with neurological manifestations, uncontrolled ([place]) 11/29/2007  . ERECTILE DYSFUNCTION 11/29/2007  . Essential hypertension 11/29/2007    Past Surgical History:  Procedure Laterality Date  . AORTIC VALVE REPLACEMENT N/A 10/29/2015   Procedure: AORTIC VALVE REPLACEMENT (AVR) WITH 23MM MAGNA EASE TISSUE VALVE.;  Surgeon: [name], MD;  Location: [facility];  Service: Open Heart Surgery;  Laterality: N/A;  . CARDIAC CATHETERIZATION N/A 10/27/2015   Procedure: Left Heart Cath and Coronary Angiography;  Surgeon: [name], MD;  Location: [facility];  Service: Cardiovascular;  Laterality: N/A;  . CORONARY ARTERY BYPASS GRAFT N/A 10/29/2015   Procedure: CORONARY ARTERY BYPASS GRAFTING (CABG) x Four UTILIZING THE LEFT INTERNAL MAMMARY ARTERY AND ENDOSCOPICALLY HARVESTED RIGHT SAPEHENEOUS VEINS.;  Surgeon: [name], MD;  Location: [facility];  Service: Open Heart Surgery;  Laterality: N/A;  . CYST REMOVAL HAND Right   . ESOPHAGOGASTRODUODENOSCOPY N/A 12/02/2015   Procedure: ESOPHAGOGASTRODUODENOSCOPY (EGD);  Surgeon: Renelda Loma Armbruster,  MD;  Location: [facility];  Service: Gastroenterology;  Laterality: N/A;  . REPLACEMENT ASCENDING AORTA N/A 10/29/2015   Procedure: REPLACEMENT OF ASCENDING AORTA USING 34MM X 30CM WOVEN DOUBLE VELOUR VASCULAR GRAFT.;  Surgeon: [name], MD;  Location: [facility];  Service: Open Heart Surgery;  Laterality: N/A;  . TEE  WITHOUT CARDIOVERSION N/A 10/29/2015   Procedure: TRANSESOPHAGEAL ECHOCARDIOGRAM (TEE);  Surgeon: [name], MD;  Location: [facility];  Service: Open Heart Surgery;  Laterality: N/A;  . UVULECTOMY N/A 12/10/2015   Procedure: UVULECTOMY;  Surgeon: [name], MD;  Location: [facility];  Service: ENT;  Laterality: N/A;       Family History  Problem Relation Age of Onset  . Cancer Father        Throat cancer  . Diabetes Sister     Social History   Tobacco Use  . Smoking status: Never Smoker  . Smokeless tobacco: Never Used  Substance Use Topics  . Alcohol use: Yes    Alcohol/week: 1.0 standard drinks    Types: 1 Glasses of wine per week    Comment: 1 x month  . Drug use: No    Home Medications Prior to Admission medications   Medication Sig Start Date End Date Taking? Authorizing Provider  aspirin EC 81 MG tablet Take 81 mg by mouth daily.    [provider]  B-D ULTRAFINE III SHORT PEN 31G X 8 MM MISC USE 4 TIMES DAILY AS DIRECTED 11/30/15   Nafziger, Tommi Rumps, NP  glipiZIDE (GLUCOTROL XL) 10 MG 24 hr tablet TAKE 1 TABLET (10 MG TOTAL) BY MOUTH DAILY WITH BREAKFAST. 12/17/18   Nafziger, Tommi Rumps, NP  hydrochlorothiazide (HYDRODIURIL) 25 MG tablet Take 1 tablet (25 mg total) by mouth daily. 07/13/17   [name], [name], MD  Insulin Glargine ([name] KWIKPEN) 100 UNIT/ML SOPN INJECT 0.23 MLS (23 UNITS TOTAL) INTO THE SKIN AT BEDTIME. [name] taking differently: Inject 25 Units into the skin at bedtime.  07/17/17   Nafziger, Tommi Rumps, NP  metFORMIN (GLUCOPHAGE) 1000 MG tablet TAKE 1 TABLET (1,000 MG TOTAL) BY MOUTH 2 (TWO) TIMES DAILY WITH A MEAL. 02/04/18   Nafziger, Tommi Rumps, NP  metoprolol tartrate (LOPRESSOR) 50 MG tablet Take 1.5 tablets (75 mg total) by mouth 2 (two) times daily. 02/05/19   Nafziger, Tommi Rumps, NP  ONETOUCH VERIO test strip TEST BLOOD SUGARS DAILY. DX: E11.9 08/14/17   Nafziger, Tommi Rumps, NP  potassium chloride (K-DUR) 10 MEQ tablet 1 TAB EVERY DAY 11/11/18   [name], [name], MD    rosuvastatin (CRESTOR) 20 MG tablet TAKE 1 TABLET BY MOUTH EVERY DAY 02/03/19   [name], [name], MD    Allergies    Amiodarone and Quinolones  Review of Systems   Review of Systems  All other systems reviewed and are negative.   Physical Exam Updated Vital Signs BP (!) 152/70   Pulse (!) 59   Temp 98.5 F (36.9 C) (Oral)   Resp 19   Ht 6' (1.829 m)   Wt 88 kg   SpO2 100%   BMI 26.31 kg/m   Physical Exam Vitals and nursing note reviewed.  Constitutional:      Appearance: [name] is well-developed.  HENT:     Head: Normocephalic and atraumatic.  Eyes:     Conjunctiva/sclera: Conjunctivae normal.  Cardiovascular:     Rate and Rhythm: Normal rate and regular rhythm.     Heart sounds: No murmur.  Pulmonary:     Effort: Pulmonary effort is normal.  No respiratory distress.     Breath sounds: Normal breath sounds.  Abdominal:     Palpations: Abdomen is soft.     Tenderness: There is no abdominal tenderness.  Musculoskeletal:        General: Normal range of motion.     Cervical back: Neck supple.  Skin:    General: Skin is warm and dry.  Neurological:     Mental Status: [name] is alert and oriented to person, place, and time.  Psychiatric:        Mood and Affect: Mood normal.        Behavior: Behavior normal.     ED Results / Procedures / Treatments   Labs (all labs ordered are listed, but only abnormal results are displayed) Labs Reviewed  LACTIC ACID, PLASMA - Abnormal; Notable for the following components:      Result Value   Lactic Acid, Venous 2.4 (*)    All other components within normal limits  COMPREHENSIVE METABOLIC PANEL - Abnormal; Notable for the following components:   Sodium 133 (*)    Glucose, Bld 318 (*)    Creatinine, Ser 1.28 (*)    GFR calc non Af Amer 56 (*)    All other components within normal limits  URINALYSIS, ROUTINE W REFLEX MICROSCOPIC - Abnormal; Notable for the following components:   APPearance HAZY (*)    Glucose, UA >=500 (*)     Protein, ur 100 (*)    All other components within normal limits  CULTURE, BLOOD (ROUTINE X 2)  CULTURE, BLOOD (ROUTINE X 2)  SARS CORONAVIRUS 2 (TAT 6-24 HRS)  CBC WITH DIFFERENTIAL/PLATELET  LACTIC ACID, PLASMA  POC SARS CORONAVIRUS 2 AG -  ED    EKG None  Radiology DG Chest Portable 1 View  Result Date: 02/09/2019 CLINICAL DATA:  Fever and cough EXAM: PORTABLE CHEST 1 VIEW COMPARISON:  12/20/2015 FINDINGS: Unchanged remote median sternotomy for CABG. Prosthetic aortic valve. No focal airspace consolidation or pulmonary edema. No pleural effusion or pneumothorax. IMPRESSION: No active cardiopulmonary disease. Electronically Signed   By: Ulyses Jarred M.D.   On: 02/09/2019 01:10    Procedures Procedures (including critical care time)  Medications Ordered in ED Medications  sodium chloride flush (NS) 0.9 % injection 3 mL (has no administration in time range)  acetaminophen (TYLENOL) tablet 650 mg (has no administration in time range)    ED Course  I have reviewed the triage vital signs and the nursing notes.  Pertinent labs & imaging results that were available during my care of the [name] were reviewed by me and considered in my medical decision making (see chart for details).    MDM Rules/Calculators/A&P                       [name] with fever and cough.  Onset of symptoms was 1 day ago.  No treatments prior to arrival.  Cough is nonproductive.  Chest x-ray shows no evidence of infection.  [name] does not have a leukocytosis, but lactic acid level is 2.4.  Will give some fluid.  Creatinine is mildly elevated at 1.28.  Urinalysis is negative.  Highly suspicious for Covid.  6:18 AM Coronavirus test is positive.  [name] is saturating 96 to 98% on room air.  Will recheck lactic acid level after fluids.  If normalizing, and [name] remains well-appearing with normal vitals cardiovascular discharged home.  [name] signed out to oncoming team, who will discharge pending  reassuring repeat lactic  acid hypoxia.  AYDEEN STRATHMAN was evaluated in Emergency Department on 02/09/2019 for the symptoms described in the history of present illness. [name] was evaluated in the context of the global COVID-19 pandemic, which necessitated consideration that the [name] might be at risk for infection with the SARS-CoV-2 virus that causes COVID-19. Institutional protocols and algorithms that pertain to the evaluation of patients at risk for COVID-19 are in a state of rapid change based on information released by regulatory bodies including the CDC and federal and state organizations. These policies and algorithms were followed during the [name]'s care in the ED.       Final Clinical Impression(s) / ED Diagnoses Final diagnoses:  COVID-19 virus infection    Rx / DC Orders ED Discharge Orders    None       Montine Circle, PA-C 02/09/19 [id number]    Veryl Speak, MD 02/09/19 2300

## 2019-02-09 NOTE — Discharge Instructions (Addendum)
You need to isolate at home for 14 days.  Take Tylenol for fever.  If you develop shortness of breath or worsening symptoms, please return to the emergency department.

## 2019-02-10 ENCOUNTER — Telehealth: Payer: Self-pay | Admitting: Nurse Practitioner

## 2019-02-10 NOTE — Telephone Encounter (Signed)
Called to Discuss with patient about Covid symptoms and the use of bamlanivimab, a monoclonal antibody infusion for those with mild to moderate Covid symptoms and at a high risk of hospitalization.     Pt is qualified for this infusion at the [facility] due to co-morbid conditions and/or a member of an at-risk group.     Unable to reach pt  

## 2019-02-11 ENCOUNTER — Telehealth (HOSPITAL_COMMUNITY): Payer: Self-pay

## 2019-02-11 ENCOUNTER — Telehealth: Payer: Self-pay | Admitting: *Deleted

## 2019-02-11 NOTE — Telephone Encounter (Signed)
Rest and stay hydrated as much as possible. Eat heart healthy foods.

## 2019-02-11 NOTE — Telephone Encounter (Signed)
Copied from [place] [id number]. Topic: General - Other >> Feb 11, 2019  1:04 PM Rainey Pines A wrote: Patient daughter called and wanted to know what he could do to assist patient in boosting immune system since he has tested positive for covid. Please advise

## 2019-02-11 NOTE — Telephone Encounter (Signed)
No DPR on file for [name].  I called [name] and informed him of below message.  Nothing further needed.

## 2019-02-12 ENCOUNTER — Encounter: Payer: Self-pay | Admitting: Family Medicine

## 2019-02-13 LAB — CULTURE, BLOOD (ROUTINE X 2)
Culture: NO GROWTH
Culture: NO GROWTH
Special Requests: ADEQUATE

## 2019-02-15 ENCOUNTER — Other Ambulatory Visit: Payer: Self-pay

## 2019-02-15 ENCOUNTER — Emergency Department (HOSPITAL_COMMUNITY): Payer: Medicare PPO

## 2019-02-15 ENCOUNTER — Inpatient Hospital Stay (HOSPITAL_COMMUNITY)
Admission: EM | Admit: 2019-02-15 | Discharge: 2019-02-23 | DRG: 177 | Disposition: A | Discharge status: Home Health | Payer: Medicare PPO | Attending: Internal Medicine | Admitting: Internal Medicine

## 2019-02-15 ENCOUNTER — Encounter (HOSPITAL_COMMUNITY): Payer: Self-pay | Admitting: Emergency Medicine

## 2019-02-15 DIAGNOSIS — Z808 Family history of malignant neoplasm of other organs or systems: Secondary | ICD-10-CM | POA: Diagnosis not present

## 2019-02-15 DIAGNOSIS — Z951 Presence of aortocoronary bypass graft: Secondary | ICD-10-CM

## 2019-02-15 DIAGNOSIS — Z7982 Long term (current) use of aspirin: Secondary | ICD-10-CM | POA: Diagnosis not present

## 2019-02-15 DIAGNOSIS — I2510 Atherosclerotic heart disease of native coronary artery without angina pectoris: Secondary | ICD-10-CM | POA: Diagnosis present

## 2019-02-15 DIAGNOSIS — Z833 Family history of diabetes mellitus: Secondary | ICD-10-CM | POA: Diagnosis not present

## 2019-02-15 DIAGNOSIS — I5032 Chronic diastolic (congestive) heart failure: Secondary | ICD-10-CM | POA: Diagnosis present

## 2019-02-15 DIAGNOSIS — Z79899 Other long term (current) drug therapy: Secondary | ICD-10-CM | POA: Diagnosis not present

## 2019-02-15 DIAGNOSIS — I110 Hypertensive heart disease with heart failure: Secondary | ICD-10-CM | POA: Diagnosis present

## 2019-02-15 DIAGNOSIS — J9601 Acute respiratory failure with hypoxia: Secondary | ICD-10-CM | POA: Diagnosis not present

## 2019-02-15 DIAGNOSIS — I5031 Acute diastolic (congestive) heart failure: Secondary | ICD-10-CM | POA: Diagnosis not present

## 2019-02-15 DIAGNOSIS — R7989 Other specified abnormal findings of blood chemistry: Secondary | ICD-10-CM | POA: Diagnosis present

## 2019-02-15 DIAGNOSIS — Z794 Long term (current) use of insulin: Secondary | ICD-10-CM

## 2019-02-15 DIAGNOSIS — E1165 Type 2 diabetes mellitus with hyperglycemia: Secondary | ICD-10-CM | POA: Diagnosis present

## 2019-02-15 DIAGNOSIS — U071 COVID-19: Principal | ICD-10-CM | POA: Diagnosis present

## 2019-02-15 DIAGNOSIS — J1282 Pneumonia due to coronavirus disease 2019: Secondary | ICD-10-CM | POA: Diagnosis present

## 2019-02-15 DIAGNOSIS — E785 Hyperlipidemia, unspecified: Secondary | ICD-10-CM | POA: Diagnosis present

## 2019-02-15 DIAGNOSIS — I480 Paroxysmal atrial fibrillation: Secondary | ICD-10-CM | POA: Diagnosis present

## 2019-02-15 DIAGNOSIS — Z953 Presence of xenogenic heart valve: Secondary | ICD-10-CM

## 2019-02-15 HISTORY — DX: COVID-19: U07.1

## 2019-02-15 LAB — BASIC METABOLIC PANEL
Anion gap: 12 (ref 5–15)
BUN: 17 mg/dL (ref 8–23)
CO2: 25 mmol/L (ref 22–32)
Calcium: 8.8 mg/dL — ABNORMAL LOW (ref 8.9–10.3)
Chloride: 98 mmol/L (ref 98–111)
Creatinine, Ser: 1.1 mg/dL (ref 0.61–1.24)
GFR calc Af Amer: >60 mL/min (ref >60)
GFR calc non Af Amer: >60 mL/min (ref >60)
Glucose, Bld: 302 mg/dL — ABNORMAL HIGH (ref 70–99)
Potassium: 4.4 mmol/L (ref 3.5–5.1)
Sodium: 135 mmol/L (ref 135–145)

## 2019-02-15 LAB — COMPREHENSIVE METABOLIC PANEL
ALT: 144 U/L — ABNORMAL HIGH (ref 0–44)
AST: 158 U/L — ABNORMAL HIGH (ref 15–41)
Albumin: 3.1 g/dL — ABNORMAL LOW (ref 3.5–5.0)
Alkaline Phosphatase: 141 U/L — ABNORMAL HIGH (ref 38–126)
Anion gap: 13 (ref 5–15)
BUN: 19 mg/dL (ref 8–23)
CO2: 26 mmol/L (ref 22–32)
Calcium: 9 mg/dL (ref 8.9–10.3)
Chloride: 96 mmol/L — ABNORMAL LOW (ref 98–111)
Creatinine, Ser: 1.08 mg/dL (ref 0.61–1.24)
GFR calc Af Amer: >60 mL/min (ref >60)
GFR calc non Af Amer: >60 mL/min (ref >60)
Glucose, Bld: 294 mg/dL — ABNORMAL HIGH (ref 70–99)
Potassium: 4.4 mmol/L (ref 3.5–5.1)
Sodium: 135 mmol/L (ref 135–145)
Total Bilirubin: 0.9 mg/dL (ref 0.3–1.2)
Total Protein: 7.6 g/dL (ref 6.5–8.1)

## 2019-02-15 LAB — C-REACTIVE PROTEIN: CRP: 19.6 mg/dL — ABNORMAL HIGH (ref <1.0)

## 2019-02-15 LAB — TROPONIN I (HIGH SENSITIVITY)
Troponin I (High Sensitivity): 8 ng/L (ref <18)
Troponin I (High Sensitivity): 9 ng/L (ref <18)

## 2019-02-15 LAB — D-DIMER, QUANTITATIVE: D-Dimer, Quant: 2.46 ug/mL-FEU — ABNORMAL HIGH (ref 0.00–0.50)

## 2019-02-15 LAB — PROCALCITONIN: Procalcitonin: 0.28 ng/mL

## 2019-02-15 LAB — TRIGLYCERIDES: Triglycerides: 160 mg/dL — ABNORMAL HIGH (ref <150)

## 2019-02-15 LAB — FERRITIN: Ferritin: 3913 ng/mL — ABNORMAL HIGH (ref 24–336)

## 2019-02-15 LAB — CBC
HCT: 40.4 % (ref 39.0–52.0)
Hemoglobin: 13.5 g/dL (ref 13.0–17.0)
MCH: 29.6 pg (ref 26.0–34.0)
MCHC: 33.4 g/dL (ref 30.0–36.0)
MCV: 88.6 fL (ref 80.0–100.0)
Platelets: 287 10*3/uL (ref 150–400)
RBC: 4.56 MIL/uL (ref 4.22–5.81)
RDW: 13.4 % (ref 11.5–15.5)
WBC: 8.6 10*3/uL (ref 4.0–10.5)
nRBC: 0 % (ref 0.0–0.2)

## 2019-02-15 LAB — LACTIC ACID, PLASMA
Lactic Acid, Venous: 1.8 mmol/L (ref 0.5–1.9)
Lactic Acid, Venous: 1.2 mmol/L (ref 0.5–1.9)

## 2019-02-15 LAB — LACTATE DEHYDROGENASE: LDH: 543 U/L — ABNORMAL HIGH (ref 98–192)

## 2019-02-15 LAB — CBG MONITORING, ED
Glucose-Capillary: 255 mg/dL — ABNORMAL HIGH (ref 70–99)
Glucose-Capillary: 256 mg/dL — ABNORMAL HIGH (ref 70–99)

## 2019-02-15 LAB — FIBRINOGEN: Fibrinogen: >800 mg/dL — ABNORMAL HIGH (ref 210–475)

## 2019-02-15 MED ORDER — HYDROCHLOROTHIAZIDE 25 MG PO TABS
25.0000 mg | ORAL_TABLET | Freq: Every day | ORAL | Status: DC
  Administered 2019-02-15 – 2019-02-17 (×3): 25 mg via ORAL
  Filled 2019-02-15 (×3): qty 1

## 2019-02-15 MED ORDER — ALBUTEROL SULFATE HFA 108 (90 BASE) MCG/ACT IN AERS
1.0000 | INHALATION_SPRAY | RESPIRATORY_TRACT | Status: DC | PRN
  Filled 2019-02-15: qty 6.7

## 2019-02-15 MED ORDER — BASAGLAR KWIKPEN 100 UNIT/ML ~~LOC~~ SOPN: 25.0000 [IU] | PEN_INJECTOR | Freq: Every day | SUBCUTANEOUS | Status: DC

## 2019-02-15 MED ORDER — POLYETHYLENE GLYCOL 3350 17 G PO PACK: 17.0000 g | PACK | Freq: Every day | ORAL | Status: DC | PRN

## 2019-02-15 MED ORDER — SODIUM CHLORIDE 0.9% FLUSH
3.0000 mL | Freq: Once | INTRAVENOUS | Status: AC
  Administered 2019-02-15: 3 mL via INTRAVENOUS

## 2019-02-15 MED ORDER — METOPROLOL TARTRATE 50 MG PO TABS
75.0000 mg | ORAL_TABLET | Freq: Two times a day (BID) | ORAL | Status: DC
  Administered 2019-02-15 – 2019-02-16 (×3): 75 mg via ORAL
  Filled 2019-02-15 (×2): qty 3
  Filled 2019-02-15: qty 1

## 2019-02-15 MED ORDER — ZINC SULFATE 220 (50 ZN) MG PO CAPS
220.0000 mg | ORAL_CAPSULE | Freq: Every day | ORAL | Status: DC
  Administered 2019-02-15 – 2019-02-23 (×9): 220 mg via ORAL
  Filled 2019-02-15 (×9): qty 1

## 2019-02-15 MED ORDER — SODIUM CHLORIDE 0.9 % IV SOLN
500.0000 mg | INTRAVENOUS | Status: DC
  Administered 2019-02-15: 19:00:00 500 mg via INTRAVENOUS
  Filled 2019-02-15 (×2): qty 500

## 2019-02-15 MED ORDER — ACETAMINOPHEN 325 MG PO TABS: 650.0000 mg | ORAL_TABLET | Freq: Four times a day (QID) | ORAL | Status: DC | PRN

## 2019-02-15 MED ORDER — ONDANSETRON HCL 4 MG PO TABS: 4.0000 mg | ORAL_TABLET | Freq: Four times a day (QID) | ORAL | Status: DC | PRN

## 2019-02-15 MED ORDER — ASCORBIC ACID 500 MG PO TABS
500.0000 mg | ORAL_TABLET | Freq: Every day | ORAL | Status: DC
  Administered 2019-02-15 – 2019-02-23 (×9): 500 mg via ORAL
  Filled 2019-02-15 (×9): qty 1

## 2019-02-15 MED ORDER — INSULIN ASPART 100 UNIT/ML ~~LOC~~ SOLN
0.0000 [IU] | SUBCUTANEOUS | Status: DC
  Administered 2019-02-15 (×2): 8 [IU] via SUBCUTANEOUS
  Administered 2019-02-16: 04:00:00 5 [IU] via SUBCUTANEOUS
  Administered 2019-02-16 (×2): 3 [IU] via SUBCUTANEOUS
  Administered 2019-02-16 (×2): 5 [IU] via SUBCUTANEOUS
  Administered 2019-02-17: 3 [IU] via SUBCUTANEOUS
  Administered 2019-02-17: 5 [IU] via SUBCUTANEOUS
  Administered 2019-02-17 (×2): 3 [IU] via SUBCUTANEOUS

## 2019-02-15 MED ORDER — TRAMADOL HCL 50 MG PO TABS: 50.0000 mg | ORAL_TABLET | Freq: Four times a day (QID) | ORAL | Status: DC | PRN

## 2019-02-15 MED ORDER — SODIUM CHLORIDE 0.9 % IV SOLN
200.0000 mg | Freq: Once | INTRAVENOUS | Status: AC
  Administered 2019-02-15: 200 mg via INTRAVENOUS
  Filled 2019-02-15: qty 40

## 2019-02-15 MED ORDER — DEXAMETHASONE SODIUM PHOSPHATE 10 MG/ML IJ SOLN
6.0000 mg | INTRAMUSCULAR | Status: DC
  Administered 2019-02-15 – 2019-02-21 (×7): 6 mg via INTRAVENOUS
  Filled 2019-02-15 (×7): qty 1

## 2019-02-15 MED ORDER — ASPIRIN EC 81 MG PO TBEC
81.0000 mg | DELAYED_RELEASE_TABLET | Freq: Every day | ORAL | Status: DC
  Administered 2019-02-15 – 2019-02-23 (×9): 81 mg via ORAL
  Filled 2019-02-15 (×9): qty 1

## 2019-02-15 MED ORDER — FAMOTIDINE 20 MG PO TABS
20.0000 mg | ORAL_TABLET | Freq: Two times a day (BID) | ORAL | Status: DC
  Administered 2019-02-15 – 2019-02-23 (×16): 20 mg via ORAL
  Filled 2019-02-15 (×16): qty 1

## 2019-02-15 MED ORDER — INSULIN GLARGINE 100 UNIT/ML ~~LOC~~ SOLN
25.0000 [IU] | Freq: Every day | SUBCUTANEOUS | Status: DC
  Administered 2019-02-15 – 2019-02-17 (×3): 25 [IU] via SUBCUTANEOUS
  Filled 2019-02-15 (×4): qty 0.25

## 2019-02-15 MED ORDER — GUAIFENESIN-DM 100-10 MG/5ML PO SYRP: 10.0000 mL | ORAL_SOLUTION | ORAL | Status: DC | PRN

## 2019-02-15 MED ORDER — SODIUM CHLORIDE 0.9 % IV SOLN
100.0000 mg | INTRAVENOUS | Status: AC
  Administered 2019-02-16 – 2019-02-19 (×4): 100 mg via INTRAVENOUS
  Filled 2019-02-15 (×4): qty 20

## 2019-02-15 MED ORDER — ENOXAPARIN SODIUM 40 MG/0.4ML ~~LOC~~ SOLN
40.0000 mg | SUBCUTANEOUS | Status: DC
  Administered 2019-02-15 – 2019-02-22 (×8): 40 mg via SUBCUTANEOUS
  Filled 2019-02-15 (×8): qty 0.4

## 2019-02-15 MED ORDER — ONDANSETRON HCL 4 MG/2ML IJ SOLN: 4.0000 mg | Freq: Four times a day (QID) | INTRAMUSCULAR | Status: DC | PRN

## 2019-02-15 MED ORDER — ROSUVASTATIN CALCIUM 20 MG PO TABS
20.0000 mg | ORAL_TABLET | Freq: Every day | ORAL | Status: DC
  Administered 2019-02-15 – 2019-02-23 (×9): 20 mg via ORAL
  Filled 2019-02-15 (×10): qty 1

## 2019-02-15 MED ORDER — IPRATROPIUM-ALBUTEROL 20-100 MCG/ACT IN AERS
1.0000 | INHALATION_SPRAY | Freq: Four times a day (QID) | RESPIRATORY_TRACT | Status: DC
  Administered 2019-02-15 – 2019-02-16 (×3): 1 via RESPIRATORY_TRACT
  Filled 2019-02-15: qty 4

## 2019-02-15 MED ORDER — SODIUM CHLORIDE 0.9 % IV SOLN
1.0000 g | INTRAVENOUS | Status: DC
  Administered 2019-02-15: 18:00:00 1 g via INTRAVENOUS
  Filled 2019-02-15 (×2): qty 10

## 2019-02-15 MED ORDER — BISACODYL 10 MG RE SUPP: 10.0000 mg | Freq: Every day | RECTAL | Status: DC | PRN

## 2019-02-15 MED ORDER — DEXAMETHASONE SODIUM PHOSPHATE 10 MG/ML IJ SOLN
10.0000 mg | Freq: Once | INTRAMUSCULAR | Status: AC
  Administered 2019-02-15: 15:00:00 10 mg via INTRAVENOUS
  Filled 2019-02-15: qty 1

## 2019-02-15 MED ORDER — HYDROCOD POLST-CPM POLST ER 10-8 MG/5ML PO SUER: 5.0000 mL | Freq: Two times a day (BID) | ORAL | Status: DC | PRN

## 2019-02-15 NOTE — ED Provider Notes (Signed)
[facility]ERG[facility]Y DEPARTMENT Provider Note   CSN: [id number] Arrival date & time: 02/15/19  1238     History Chief Complaint  Patient presents with  . COVID +  . Shortness of Breath  . Emesis  . Chest Pain    [name] is a 74 y.o. male.  The history is provided by the patient.  Shortness of Breath Severity:  Moderate Onset quality:  Gradual Timing:  Constant Progression:  Worsening Chronicity:  New Context: URI (COVID positive)   Relieved by:  Nothing Worsened by:  Exertion Associated symptoms: cough and fever   Associated symptoms: no abdominal pain, no chest pain, no ear pain, no rash, no sore throat and no vomiting   Risk factors comment:  CAD      Past Medical History:  Diagnosis Date  . 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 Carpal tunnel syndrome 09/24/2014   Bilateral  . 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 COVID-19   . Diabetes mellitus, type 2 ([place])   . Erectile dysfunction   . Essential hypertension   . Hyperlipidemia     Patient Active Problem List   Diagnosis Date Noted  . S/P AVR (aortic valve replacement) 11/14/2016  . 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 Uvular swelling 12/06/2015  . Tibial plateau fracture, left, closed, initial encounter 12/02/2015  . Coronary artery disease involving coronary bypass graft of native heart without angina pectoris   . PAF (paroxysmal atrial fibrillation) ([place]) 11/24/2015  . Orthostatic hypotension   . Fungus present in urine   . Hx of CABG (Sept 2017) 11/13/2015  . Atrial fibrillation (post op after CABG Sept 2017) 11/13/2015  . Orthostatic syncope 11/13/2015  . Elevated troponin 11/13/2015  . Coronary artery disease involving native coronary artery of native heart without angina pectoris   . Aortic stenosis s/p tissue valve (Sept 2017) 10/28/2015  . Atypical angina ([place]) 10/27/2015  .  Abnormal nuclear stress test 10/27/2015  . Coronary artery disease involving native heart with angina pectoris ([place]) 10/27/2015  . Aortic aneurysm ([place]) 07/23/2015  . Carpal tunnel syndrome 09/24/2014  . Left carotid bruit 05/07/2013  . Diabetic retinopathy ([place]) 09/23/2012  . Diminished pulses in lower extremity 06/17/2012  . Chest pain 03/22/2011  . Paresthesia of foot 11/04/2010  . Aortic valve disease 04/06/2010  . HYPERTROPHY PROSTATE W/UR OBST & OTH LUTS 03/15/2009  . Hyperlipidemia 12/13/2007  . Type II diabetes mellitus with neurological manifestations, uncontrolled ([place]) 11/29/2007  . ERECTILE DYSFUNCTION 11/29/2007  . Essential hypertension 11/29/2007    Past Surgical History:  Procedure Laterality Date  . AORTIC VALVE REPLACEMENT N/A 10/29/2015   Procedure: AORTIC VALVE REPLACEMENT (AVR) WITH 23MM MAGNA EASE TISSUE VALVE.;  Surgeon: [name], MD;  Location: [facility];  Service: Open Heart Surgery;  Laterality: N/A;  . CARDIAC CATHETERIZATION N/A 10/27/2015   Procedure: Left Heart Cath and Coronary Angiography;  Surgeon: [name], MD;  Location: [facility];  Service: Cardiovascular;  Laterality: N/A;  . CORONARY ARTERY BYPASS GRAFT N/A 10/29/2015   Procedure: CORONARY ARTERY BYPASS GRAFTING (CABG) x Four UTILIZING THE LEFT INTERNAL MAMMARY ARTERY AND ENDOSCOPICALLY HARVESTED RIGHT SAPEHENEOUS VEINS.;  Surgeon: [name], MD;  Location: [facility];  Service: Open Heart Surgery;  Laterality: N/A;  . CYST REMOVAL HAND Right   . ESOPHAGOGASTRODUODENOSCOPY N/A 12/02/2015   Procedure: ESOPHAGOGASTRODUODENOSCOPY (EGD);  Surgeon: [name], MD;  Location: [facility];  Service: Gastroenterology;  Laterality: N/A;  . REPLACEMENT ASCENDING AORTA N/A 10/29/2015   Procedure: REPLACEMENT OF ASCENDING AORTA USING 34MM X 30CM WOVEN DOUBLE VELOUR VASCULAR GRAFT.;  Surgeon: [name], MD;  Location: [facility];  Service: Open Heart Surgery;  Laterality: N/A;    . TEE WITHOUT CARDIOVERSION N/A 10/29/2015   Procedure: TRANSESOPHAGEAL ECHOCARDIOGRAM (TEE);  Surgeon: [name], MD;  Location: [facility];  Service: Open Heart Surgery;  Laterality: N/A;  . UVULECTOMY N/A 12/10/2015   Procedure: UVULECTOMY;  Surgeon: [name], MD;  Location: [facility];  Service: ENT;  Laterality: N/A;       Family History  Problem Relation Age of Onset  . Cancer Father        Throat cancer  . Diabetes Sister     Social History   Tobacco Use  . Smoking status: Never Smoker  . Smokeless tobacco: Never Used  Substance Use Topics  . Alcohol use: Yes    Alcohol/week: 1.0 standard drinks    Types: 1 Glasses of wine per week    Comment: 1 x month  . Drug use: No    Home Medications Prior to Admission medications   Medication Sig Start Date End Date Taking? Authorizing Provider  aspirin EC 81 MG tablet Take 81 mg by mouth daily.    [provider]  B-D ULTRAFINE III SHORT PEN 31G X 8 MM MISC USE 4 TIMES DAILY AS DIRECTED 11/30/15   Nafziger, Tommi Rumps, NP  glipiZIDE (GLUCOTROL XL) 10 MG 24 hr tablet TAKE 1 TABLET (10 MG TOTAL) BY MOUTH DAILY WITH BREAKFAST. 12/17/18   Nafziger, Tommi Rumps, NP  hydrochlorothiazide (HYDRODIURIL) 25 MG tablet Take 1 tablet (25 mg total) by mouth daily. 07/13/17   [name], [name], MD  Insulin Glargine ([name] KWIKPEN) 100 UNIT/ML SOPN INJECT 0.23 MLS (23 UNITS TOTAL) INTO THE SKIN AT BEDTIME. Patient taking differently: Inject 25 Units into the skin at bedtime.  07/17/17   Nafziger, Tommi Rumps, NP  metFORMIN (GLUCOPHAGE) 1000 MG tablet TAKE 1 TABLET (1,000 MG TOTAL) BY MOUTH 2 (TWO) TIMES DAILY WITH A MEAL. 02/04/18   Nafziger, Tommi Rumps, NP  metoprolol tartrate (LOPRESSOR) 50 MG tablet Take 1.5 tablets (75 mg total) by mouth 2 (two) times daily. 02/05/19   Nafziger, Tommi Rumps, NP  ONETOUCH VERIO test strip TEST BLOOD SUGARS DAILY. DX: E11.9 08/14/17   Nafziger, Tommi Rumps, NP  potassium chloride (K-DUR) 10 MEQ tablet 1 TAB EVERY DAY 11/11/18   [name],  [name], MD  rosuvastatin (CRESTOR) 20 MG tablet TAKE 1 TABLET BY MOUTH EVERY DAY 02/03/19   [name], [name], MD    Allergies    Amiodarone and Quinolones  Review of Systems   Review of Systems  Constitutional: Positive for fever. Negative for chills.  HENT: Negative for ear pain and sore throat.   Eyes: Negative for pain and visual disturbance.  Respiratory: Positive for cough and shortness of breath.   Cardiovascular: Negative for chest pain and palpitations.  Gastrointestinal: Negative for abdominal pain and vomiting.  Genitourinary: Negative for dysuria and hematuria.  Musculoskeletal: Negative for arthralgias and back pain.  Skin: Negative for color change and rash.  Neurological: Negative for seizures and syncope.  All other systems reviewed and are negative.   Physical Exam Updated Vital Signs  ED Triage Vitals  Enc Vitals Group     BP 02/15/19 1244 (!) 137/52     Pulse Rate 02/15/19 1244 100  Resp 02/15/19 1244 14     Temp 02/15/19 1244 99.2 F (37.3 C)     Temp Source 02/15/19 1244 Oral     SpO2 02/15/19 1244 (!) 84 %     Weight --      Height --      Head Circumference --      Peak Flow --      Pain Score 02/15/19 1248 2     Pain Loc --      Pain Edu? --      Excl. in [place]? --     Physical Exam Vitals and nursing note reviewed.  Constitutional:      General: He is not in acute distress.    Appearance: He is well-developed. He is not ill-appearing.  HENT:     Head: Normocephalic and atraumatic.  Eyes:     Conjunctiva/sclera: Conjunctivae normal.     Pupils: Pupils are equal, round, and reactive to light.  Cardiovascular:     Rate and Rhythm: Normal rate and regular rhythm.     Pulses: Normal pulses.     Heart sounds: Normal heart sounds. No murmur.  Pulmonary:     Effort: Tachypnea present. No respiratory distress.     Breath sounds: Decreased breath sounds present.  Abdominal:     Palpations: Abdomen is soft.     Tenderness: There is no  abdominal tenderness.  Musculoskeletal:        General: Normal range of motion.     Cervical back: Normal range of motion and neck supple.     Right lower leg: No edema.     Left lower leg: No edema.  Skin:    General: Skin is warm and dry.  Neurological:     General: No focal deficit present.     Mental Status: He is alert.     Cranial Nerves: No cranial nerve deficit.     Motor: No weakness.  Psychiatric:        Mood and Affect: Mood normal.     ED Results / Procedures / Treatments   Labs (all labs ordered are listed, but only abnormal results are displayed) Labs Reviewed  BASIC METABOLIC PANEL - Abnormal; Notable for the following components:      Result Value   Glucose, Bld 302 (*)    Calcium 8.8 (*)    All other components within normal limits  COMPREHENSIVE METABOLIC PANEL - Abnormal; Notable for the following components:   Chloride 96 (*)    Glucose, Bld 294 (*)    Albumin 3.1 (*)    AST 158 (*)    ALT 144 (*)    Alkaline Phosphatase 141 (*)    All other components within normal limits  LACTATE DEHYDROGENASE - Abnormal; Notable for the following components:   LDH 543 (*)    All other components within normal limits  D-DIMER, QUANTITATIVE (NOT AT [facility]) - Abnormal; Notable for the following components:   D-Dimer, Quant 2.46 (*)    All other components within normal limits  FIBRINOGEN - Abnormal; Notable for the following components:   Fibrinogen >800 (*)    All other components within normal limits  TRIGLYCERIDES - Abnormal; Notable for the following components:   Triglycerides 160 (*)    All other components within normal limits  CULTURE, BLOOD (ROUTINE X 2)  CULTURE, BLOOD (ROUTINE X 2)  CBC  LACTIC ACID, PLASMA  LACTIC ACID, PLASMA  PROCALCITONIN  FERRITIN  C-REACTIVE PROTEIN  TROPONIN I (HIGH  SENSITIVITY)  TROPONIN I (HIGH SENSITIVITY)    EKG EKG Interpretation  Date/Time:  Saturday February 15 2019 12:50:05 EST Ventricular Rate:  95 PR  Interval:  152 QRS Duration: 84 QT Interval:  364 QTC Calculation: 457 R Axis:   50 Text Interpretation: Normal sinus rhythm Anterior infarct , age undetermined Abnormal ECG Confirmed by Lennice Sites [phone]) on 02/15/2019 2:39:45 PM   Radiology DG Chest Port 1 View  Result Date: 02/15/2019 CLINICAL DATA:  COVID-19 positivity and shortness of breath EXAM: PORTABLE CHEST 1 VIEW COMPARISON:  02/09/2019 FINDINGS: Cardiac shadow is stable. Postsurgical changes are noted. Opacities are noted in the mid and lower left lung as well as the right lung base consistent with the given clinical history. No bony abnormality is noted. IMPRESSION: Patchy opacities consistent with the given clinical history. Electronically Signed   By: Inez Catalina M.D.   On: 02/15/2019 15:52    Procedures .Critical Care Performed by: Lennice Sites, DO Authorized by: Lennice Sites, DO   Critical care provider statement:    Critical care time (minutes):  35   Critical care was necessary to treat or prevent imminent or life-threatening deterioration of the following conditions:  Respiratory failure   Critical care was time spent personally by me on the following activities:  Blood draw for specimens, development of treatment plan with patient or surrogate, discussions with primary provider, evaluation of patient's response to treatment, ordering and performing treatments and interventions, ordering and review of radiographic studies, ordering and review of laboratory studies, pulse oximetry, re-evaluation of patient's condition, review of old charts, obtaining history from patient or surrogate and examination of patient   I assumed direction of critical care for this patient from another provider in my specialty: no     (including critical care time)  Medications Ordered in ED Medications  sodium chloride flush (NS) 0.9 % injection 3 mL (3 mLs Intravenous Given 02/15/19 1501)  dexamethasone (DECADRON) injection 10 mg (10 mg  Intravenous Given 02/15/19 1512)    ED Course  I have reviewed the triage vital signs and the nursing notes.  Pertinent labs & imaging results that were available during my care of the patient were reviewed by me and considered in my medical decision making (see chart for details).    MDM Rules/Calculators/A&P  [name] is a 74 year old male with history of CAD, diabetes, hypertension, high cholesterol who presents to the ED with shortness of breath.  Diagnosed with Covid earlier this week.  Hypoxic upon arrival in triage and continues to be hypoxic upon my evaluation as well.  Placed on 2 L of oxygen.  Mildly tachypneic.  Likely from coronavirus.  EKG shows sinus rhythm.  Patient does not have any chest pain.  Continues to have intermittent cough, myalgias, fever.  CBC and BMP show no significant leukocytosis, anemia, electrolyte abnormality.  Troponin is normal.  Awaiting inflammatory markers and chest x-ray but will admit for hypoxia likely in the setting of coronavirus.  Patient was given a dose of Decadron.  Chest x-ray consistent with coronavirus.  Inflammatory markers elevated.  Will admit for further care with hospitalist service.  Stable on 2 L of oxygen.  This chart was dictated using voice recognition software.  Despite best efforts to proofread,  errors can occur which can change the documentation meaning.  [name] was evaluated in Emergency Department on 02/15/2019 for the symptoms described in the history of present illness. He was evaluated in the context of the global  COVID-19 pandemic, which necessitated consideration that the patient might be at risk for infection with the SARS-CoV-2 virus that causes COVID-19. Institutional protocols and algorithms that pertain to the evaluation of patients at risk for COVID-19 are in a state of rapid change based on information released by regulatory bodies including the CDC and federal and state organizations. These policies and  algorithms were followed during the patient's care in the ED.   Final Clinical Impression(s) / ED Diagnoses Final diagnoses:  COVID-19  Acute respiratory failure with hypoxia [facility])    Rx / DC Orders ED Discharge Orders    None       Lennice Sites, DO 02/15/19 1558

## 2019-02-15 NOTE — ED Triage Notes (Signed)
COVID + 12/26.  Reports intermittent pain to center of chest, SOB, and vomiting x 2-3 days.  Room air sats 84% on arrival.  Pt placed on [place] @ 3 liters.

## 2019-02-15 NOTE — H&P (Signed)
History and Physical    [name] [id number] DOB: [birth year] DOA: 02/15/2019  PCP: [name], NP   Patient coming from: Home  Chief Complaint: Cough, Nausea and Vomiting, SOB  HPI: [name] is a 74 y.o. 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  He presented to the ED initially on 1226 on 20 with a chief complaint of a fever and a cough.  He began running a fever that day and associated having cough and did not take anything for the symptoms and did not know of any exposures.  At that time he tested positive for COVID-19 disease but his O2 saturations were 96 to 98% and he was discharged home but subsequently had worsening symptoms and developed worsening cough, nausea and emesis as well as dry heaving and one episode of diarrhea.  Presented today after significant amount of coughing with pain in his chest with coughing, shortness of breath and worsening for the last 2 to 3 days.  He was found to be hypoxic and had worsening symptoms.  Work-up revealed that she opacities in the mid and lower left lung as well as the right lung base consistent with his COVID-19 disease and significantly elevated inflammatory markers.  [facility] was asked admit this patient for acute hypoxic respiratory failure as well as pneumonia secondary to COVID-19 disease  ED Course: In the ED had basic blood work done including a CBC, CMP, elevated inflammatory markers, chest x-ray and blood cultures drawn as well as an EKG.  Given a dose of dexamethasone and [facility] was called to admit this patient  Review of Systems: As per HPI otherwise all other systems  reviewed and negative.   Past Medical History:  Diagnosis Date  . 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 Carpal tunnel syndrome 09/24/2014   Bilateral  . 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 COVID-19   . Diabetes mellitus, type 2 ([place])   . Erectile dysfunction   . Essential hypertension   . Hyperlipidemia    Past Surgical History:  Procedure Laterality Date  . AORTIC VALVE REPLACEMENT N/A 10/29/2015   Procedure: AORTIC VALVE REPLACEMENT (AVR) WITH 23MM MAGNA EASE TISSUE VALVE.;  Surgeon: [name], MD;  Location: [facility];  Service: Open Heart Surgery;  Laterality: N/A;  . CARDIAC CATHETERIZATION N/A 10/27/2015   Procedure: Left Heart Cath and Coronary Angiography;  Surgeon: [name], MD;  Location: [facility];  Service: Cardiovascular;  Laterality: N/A;  . CORONARY ARTERY BYPASS GRAFT N/A 10/29/2015   Procedure: CORONARY ARTERY BYPASS GRAFTING (CABG) x Four UTILIZING THE LEFT INTERNAL MAMMARY ARTERY AND ENDOSCOPICALLY HARVESTED RIGHT SAPEHENEOUS VEINS.;  Surgeon: [name], MD;  Location: [facility];  Service: Open Heart Surgery;  Laterality: N/A;  . CYST REMOVAL HAND Right   . ESOPHAGOGASTRODUODENOSCOPY N/A 12/02/2015   Procedure: ESOPHAGOGASTRODUODENOSCOPY (EGD);  Surgeon: [name], MD;  Location: [facility];  Service: Gastroenterology;  Laterality: N/A;  . REPLACEMENT ASCENDING AORTA N/A 10/29/2015   Procedure: REPLACEMENT OF ASCENDING AORTA USING 34MM X 30CM WOVEN DOUBLE VELOUR VASCULAR  GRAFT.;  Surgeon: [name], MD;  Location: [facility];  Service: Open Heart Surgery;  Laterality: N/A;  . TEE WITHOUT CARDIOVERSION N/A 10/29/2015   Procedure: TRANSESOPHAGEAL ECHOCARDIOGRAM (TEE);  Surgeon: [name], MD;  Location: [facility];  Service: Open Heart Surgery;  Laterality: N/A;  . UVULECTOMY N/A 12/10/2015   Procedure: UVULECTOMY;  Surgeon: [name], MD;  Location: [facility];   Service: ENT;  Laterality: N/A;   SOCIAL HISTORY  reports that he has never smoked. He has never used smokeless tobacco. He reports current alcohol use of about 1.0 standard drinks of alcohol per week. He reports that he does not use drugs.  Allergies  Allergen Reactions  . Amiodarone Nausea And Vomiting  . Quinolones     Patient was warned about not using Cipro and similar antibiotics. Recent studies have raised concern that fluoroquinolone antibiotics could be associated with an increased risk of aortic aneurysm Fluoroquinolones have non-antimicrobial properties that might jeopardise the integrity of the extracellular matrix of the vascular wall In a  propensity score matched cohort study in [place], there was a 66% increased rate of aortic aneurysm or dissection associated with oral fluoroquinolone use, compared wit    Family History  Problem Relation Age of Onset  . Cancer Father        Throat cancer  . Diabetes Sister    Prior to Admission medications   Medication Sig Start Date End Date Taking? Authorizing Provider  aspirin EC 81 MG tablet Take 81 mg by mouth daily.    [provider]  B-D ULTRAFINE III SHORT PEN 31G X 8 MM MISC USE 4 TIMES DAILY AS DIRECTED 11/30/15   Nafziger, Tommi Rumps, NP  glipiZIDE (GLUCOTROL XL) 10 MG 24 hr tablet TAKE 1 TABLET (10 MG TOTAL) BY MOUTH DAILY WITH BREAKFAST. 12/17/18   Nafziger, Tommi Rumps, NP  hydrochlorothiazide (HYDRODIURIL) 25 MG tablet Take 1 tablet (25 mg total) by mouth daily. 07/13/17   [name], [name], MD  Insulin Glargine ([name] KWIKPEN) 100 UNIT/ML SOPN INJECT 0.23 MLS (23 UNITS TOTAL) INTO THE SKIN AT BEDTIME. Patient taking differently: Inject 25 Units into the skin at bedtime.  07/17/17   Nafziger, Tommi Rumps, NP  metFORMIN (GLUCOPHAGE) 1000 MG tablet TAKE 1 TABLET (1,000 MG TOTAL) BY MOUTH 2 (TWO) TIMES DAILY WITH A MEAL. 02/04/18   Nafziger, Tommi Rumps, NP  metoprolol tartrate (LOPRESSOR) 50 MG tablet Take 1.5 tablets (75 mg total) by mouth 2  (two) times daily. 02/05/19   Nafziger, Tommi Rumps, NP  ONETOUCH VERIO test strip TEST BLOOD SUGARS DAILY. DX: E11.9 08/14/17   Nafziger, Tommi Rumps, NP  potassium chloride (K-DUR) 10 MEQ tablet 1 TAB EVERY DAY 11/11/18   [name], [name], MD  rosuvastatin (CRESTOR) 20 MG tablet TAKE 1 TABLET BY MOUTH EVERY DAY 02/03/19   [name], [name], MD   Physical Exam: Vitals:   02/15/19 1700 02/15/19 1715 02/15/19 1730 02/15/19 1745  BP: (!) 162/73 (!) 163/77 (!) 167/82 (!) 170/82  Pulse: 71 71 76 78  Resp: (!) 34 (!) 30 (!) 31 (!) 22  Temp:      TempSrc:      SpO2: 97% 96% 98% 96%   Constitutional: WN/WD overweight African-American male currently in mild distress appears calm but slightly uncomfortable and has been coughing with a dry cough Eyes: Lids and conjunctivae normal, sclerae anicteric  ENMT: External Ears, Nose appear normal. Grossly normal hearing.  Neck: Appears normal, supple, no cervical masses, normal ROM, no appreciable thyromegaly; no JVD  Respiratory: Diminished to auscultation bilaterally with coarse breath sounds and mild rhonchi but no appreciable wheezing, crackles. Normal respiratory effort and patient is not tachypenic. No accessory muscle use.  But is wearing supplemental oxygen via nasal cannula 3 L Cardiovascular: RRR, no murmurs / rubs / gallops. S1 and S2 auscultated. No extremity edema.  Abdomen: Soft, non-tender, distended slightly. Bowel sounds positive x4.  GU: Deferred. Musculoskeletal: No clubbing / cyanosis of digits/nails. No joint deformity upper and lower extremities.  Skin: No rashes, lesions, ulcers on limited skin evaluation but has a mid chest scar from his prior CABG. No induration; Warm and dry.  Neurologic: CN 2-12 grossly intact with no focal deficits. Romberg sign and cerebellar reflexes not assessed.  Psychiatric: Normal judgment and insight. Alert and oriented x 3. Normal mood and appropriate affect.   Labs on Admission: I have personally reviewed following labs  and imaging studies  CBC: Recent Labs  Lab 02/08/19 2323 02/15/19 1300  WBC 6.1 8.6  NEUTROABS 4.0  --   HGB 13.7 13.5  HCT 40.7 40.4  MCV 90.4 88.6  PLT 169 [id number]   Basic Metabolic Panel: Recent Labs  Lab 02/08/19 2323 02/15/19 1300 02/15/19 1500  NA 133* 135 135  K 4.8 4.4 4.4  CL 98 98 96*  CO2 25 25 26   GLUCOSE 318* 302* 294*  BUN 15 17 19   CREATININE 1.28* 1.10 1.08  CALCIUM 9.0 8.8* 9.0   GFR: Estimated Creatinine Clearance: 68.9 mL/min (by C-G formula based on SCr of 1.08 mg/dL). Liver Function Tests: Recent Labs  Lab 02/08/19 2323 02/15/19 1500  AST 36 158*  ALT 26 144*  ALKPHOS 63 141*  BILITOT 0.6 0.9  PROT 7.5 7.6  ALBUMIN 3.9 3.1*   No results for input(s): LIPASE, AMYLASE in the last 168 hours. No results for input(s): AMMONIA in the last 168 hours. Coagulation Profile: No results for input(s): INR, PROTIME in the last 168 hours. Cardiac Enzymes: No results for input(s): CKTOTAL, CKMB, CKMBINDEX, TROPONINI in the last 168 hours. BNP (last 3 results) No results for input(s): PROBNP in the last 8760 hours. HbA1C: No results for input(s): HGBA1C in the last 72 hours. CBG: No results for input(s): GLUCAP in the last 168 hours. Lipid Profile: Recent Labs    02/15/19 1500  TRIG 160*   Thyroid Function Tests: No results for input(s): TSH, T4TOTAL, FREET4, T3FREE, THYROIDAB in the last 72 hours. Anemia Panel: Recent Labs    02/15/19 1500  FERRITIN 3,913*   Urine analysis:    Component Value Date/Time   COLORURINE YELLOW 02/08/2019 2258   APPEARANCEUR HAZY (A) 02/08/2019 2258   LABSPEC 1.029 02/08/2019 2258   PHURINE 5.0 02/08/2019 2258   GLUCOSEU >=500 (A) 02/08/2019 2258   GLUCOSEU NEGATIVE 04/06/2010 1554   HGBUR NEGATIVE 02/08/2019 2258   BILIRUBINUR NEGATIVE 02/08/2019 2258   BILIRUBINUR N 03/24/2016 0905   KETONESUR NEGATIVE 02/08/2019 2258   PROTEINUR 100 (A) 02/08/2019 2258   UROBILINOGEN 0.2 03/24/2016 0905   UROBILINOGEN  1.0 04/03/2014 0058   NITRITE NEGATIVE 02/08/2019 2258   LEUKOCYTESUR NEGATIVE 02/08/2019 2258   Sepsis Labs: !!!!!!!!!!!!!!!!!!!!!!!!!!!!!!!!!!!!!!!!!!!! @LABRCNTIP (procalcitonin:4,lacticidven:4) ) Recent Results (from the past 240 hour(s))  Blood culture (routine x 2)     Status: None   Collection Time: 02/08/19 10:45 PM   Specimen: BLOOD RIGHT ARM  Result Value Ref Range Status   Specimen Description BLOOD RIGHT ARM  Final   Special Requests   Final    BOTTLES DRAWN AEROBIC AND  ANAEROBIC Blood Culture results may not be optimal due to an excessive volume of blood received in culture bottles   Culture   Final    NO GROWTH 5 DAYS Performed at [facility] [address], [place], [place] [address]    Report Status 02/13/2019 FINAL  Final  Blood culture (routine x 2)     Status: None   Collection Time: 02/08/19 11:10 PM   Specimen: BLOOD RIGHT HAND  Result Value Ref Range Status   Specimen Description BLOOD RIGHT HAND  Final   Special Requests   Final    BOTTLES DRAWN AEROBIC ONLY Blood Culture adequate volume   Culture   Final    NO GROWTH 5 DAYS Performed at [facility], [place] [address] [place], [place] [address]    Report Status 02/13/2019 FINAL  Final  SARS CORONAVIRUS 2 (TAT 6-24 HRS) Nasopharyngeal Nasopharyngeal Swab     Status: Abnormal   Collection Time: 02/09/19  5:33 AM   Specimen: Nasopharyngeal Swab  Result Value Ref Range Status   SARS Coronavirus 2 POSITIVE (A) NEGATIVE Final    Comment: EMAILED TO Aretta Nip RN.@1453  ON 12.27.2020 BY TCALDWELL MT. (NOTE) SARS-CoV-2 target nucleic acids are DETECTED. The SARS-CoV-2 RNA is generally detectable in upper and lower respiratory specimens during the acute phase of infection. Positive results are indicative of the presence of SARS-CoV-2 RNA. Clinical correlation with patient history and other diagnostic information is  necessary to determine patient infection status. Positive results do not rule out  bacterial infection or co-infection with other viruses.  The expected result is Negative. Fact Sheet for Patients: SugarRoll.be Fact Sheet for Healthcare Providers: https://www.woods-mathews.com/ This test is not yet approved or cleared by the [place] FDA and  has been authorized for detection and/or diagnosis of SARS-CoV-2 by FDA under an Emergency Use Authorization (EUA). This EUA will remain  in effect (meaning this test can be used) for the duration of the COVID- 19 declaration under Section 564(b)(1) of the Act, 21 U.S.C. section 360bbb-3(b)(1), unless the authorization is terminated or revoked sooner. Performed at [facility], [place] [address], [place], [place] [address]      Radiological Exams on Admission: DG Chest Port 1 View  Result Date: 02/15/2019 CLINICAL DATA:  COVID-19 positivity and shortness of breath EXAM: PORTABLE CHEST 1 VIEW COMPARISON:  02/09/2019 FINDINGS: Cardiac shadow is stable. Postsurgical changes are noted. Opacities are noted in the mid and lower left lung as well as the right lung base consistent with the given clinical history. No bony abnormality is noted. IMPRESSION: Patchy opacities consistent with the given clinical history. Electronically Signed   By: Inez Catalina M.D.   On: 02/15/2019 15:52    EKG: Independently reviewed.  Showed a normal sinus rhythm rate of 95 and a QTC of 457 but no evidence of ST elevation on my interpretation  Assessment/Plan Active Problems:   Pneumonia due to COVID-19 virus  Acute hypoxic respiratory failure in the setting of Covid Pneumonia 2/2 to COVID-19 Disease -Presented with worsening dyspnea, cough, nausea and emesis as well as some chest pain related to his cough -Chest x-ray personally reviewed and it did show bibasilar patchy opacities and I am in agreement with the reading of the radiologist -Now on Supplemental O2 via [place] due to him being 84% and placed on 3 L  supplemental oxygen via nasal cannula -Continuous Pulse Oximetry and Maintain O2 Saturations >92 -Continue Supplemental O2 via [place] and Wean O2  as Tolerated -Checked Baseline Inflammatory Markers and Trend Daily: - Recent Labs    02/15/19 1500 02/15/19 1504  DDIMER  --  2.46*  FERRITIN 3,913*  --   LDH 543*  --   CRP 19.6*  --   -Patient's triglycerides were 160  Lab Results  Component Value Date   SARSCOV2NAA POSITIVE (A) 02/09/2019  SpO2: 96 % O2 Flow Rate (L/min): 2 L/min -Start Decadron 6 mg x 10 days and Remdesivir x5 Days -Start inhalers with Combivent scheduled as well as albuterol as needed -C/w Antitussives with Tussionex and Robitussin-DM -Check Blood Cx x2  -procalcitonin level was 0.28 and will empirically start IV ceftriaxone and IV azithromycin for possible superimposed bacterial pneumonia -Add Zinc and Vitamin C -Airborne and Contact Precautions -Repeat chest x-ray in a.m. -If clinically worsens will consider transfer to [facility]  Abnormal LFTs -Worsened since his last presentation to the ED on 02/08/2019 -Patient's AST went from 36 is now 158, and ALT went from 26 is now 144 -Likely in the setting of COVID-19 disease Plan because his ALT is not related to 20 we will still use remdesivir for now -Continue to monitor and trend and repeat CMP in a.m. -If continues to worsen or not improving will obtain a right upper quadrant ultrasound as well as a acute hepatitis panel  Hyperglycemia in the setting of Uncontrolled Type 2 Diabetes Mellitus -Hold glipizide as well as Metformin -Check hemoglobin A1c as last one was uncontrolled and showed hemoglobin A1c of 10.8 -Resume home Lantus 25 units and placed on moderate NovoLog sliding scale with Covid glycemic protocol in place -Continue monitor and trend CBGs and if necessary will further adjust insulin regimen  CAD s/p CABG, history of aortic valve replacement with a tissue valve, as well as a repair  and replacement of the  ascending aorta  -Resume aspirin 81 mg p.o. daily, metoprolol 75 mg p.o. twice daily, as well as Crestor 10 mg p.o. nightly  HTN -Continue with antihypertensives metoprolol tartrate 75 mg p.o. twice daily as well as hydrochlorothiazide 25 mg daily  HLD -Resume rosuvastatin 20 mg p.o. nightly  PAF -Today normal sinus rhythm and not on any anticoagulation  -Continue with metoprolol tartrate 25 mg p.o. twice daily  DVT prophylaxis: Lovenox  Code Status: FULL CODE  Family Communication: No family present at bedside  Disposition Plan: Pending Clinical Improvement  Consults called: None Admission status: Inpatient Telemetry   Severity of Illness: The appropriate patient status for this patient is INPATIENT. Inpatient status is judged to be reasonable and necessary in order to provide the required intensity of service to ensure the patient's safety. The patient's presenting symptoms, physical exam findings, and initial radiographic and laboratory data in the context of their chronic comorbidities is felt to place them at high risk for further clinical deterioration. Furthermore, it is not anticipated that the patient will be medically stable for discharge from the hospital within 2 midnights of admission. The following factors support the patient status of inpatient.   " The patient's presenting symptoms include SOB, Cough, Chest Pain, Nausea and Emesis . " The worrisome physical exam findings include Cough and Coarse breath sounds. " The initial radiographic and laboratory data are worrisome because of elevated Inflammatory Markers and Pneumonia. " The chronic co-morbidities are listed as above  * I certify that at the point of admission it is my clinical judgment that the patient will require inpatient hospital care spanning beyond 2 midnights from the point of admission due  to high intensity of service, high risk for further deterioration and high frequency of surveillance required.Kerney Elbe, D.O. [facility] PAGER is on [place]  If 7PM-7AM, please contact night-coverage [email or web address]  02/15/2019, 6:05 PM

## 2019-02-16 ENCOUNTER — Other Ambulatory Visit: Payer: Self-pay | Admitting: Adult Health

## 2019-02-16 DIAGNOSIS — E1140 Type 2 diabetes mellitus with diabetic neuropathy, unspecified: Secondary | ICD-10-CM

## 2019-02-16 DIAGNOSIS — IMO0002 Reserved for concepts with insufficient information to code with codable children: Secondary | ICD-10-CM

## 2019-02-16 LAB — BRAIN NATRIURETIC PEPTIDE: B Natriuretic Peptide: 427.2 pg/mL — ABNORMAL HIGH (ref 0.0–100.0)

## 2019-02-16 LAB — LACTATE DEHYDROGENASE: LDH: 449 U/L — ABNORMAL HIGH (ref 98–192)

## 2019-02-16 LAB — CBC WITH DIFFERENTIAL/PLATELET
Abs Immature Granulocytes: 0.04 10*3/uL (ref 0.00–0.07)
Basophils Absolute: 0 10*3/uL (ref 0.0–0.1)
Basophils Relative: 0 %
Eosinophils Absolute: 0 10*3/uL (ref 0.0–0.5)
Eosinophils Relative: 0 %
HCT: 48.3 % (ref 39.0–52.0)
Hemoglobin: 16.4 g/dL (ref 13.0–17.0)
Immature Granulocytes: 1 %
Lymphocytes Relative: 12 %
Lymphs Abs: 0.6 10*3/uL — ABNORMAL LOW (ref 0.7–4.0)
MCH: 29.7 pg (ref 26.0–34.0)
MCHC: 34 g/dL (ref 30.0–36.0)
MCV: 87.5 fL (ref 80.0–100.0)
Monocytes Absolute: 0.1 10*3/uL (ref 0.1–1.0)
Monocytes Relative: 3 %
Neutro Abs: 3.8 10*3/uL (ref 1.7–7.7)
Neutrophils Relative %: 84 %
Platelets: 252 10*3/uL (ref 150–400)
RBC: 5.52 MIL/uL (ref 4.22–5.81)
RDW: 13.6 % (ref 11.5–15.5)
WBC: 4.5 10*3/uL (ref 4.0–10.5)
nRBC: 0 % (ref 0.0–0.2)

## 2019-02-16 LAB — COMPREHENSIVE METABOLIC PANEL
ALT: 116 U/L — ABNORMAL HIGH (ref 0–44)
AST: 98 U/L — ABNORMAL HIGH (ref 15–41)
Albumin: 2.8 g/dL — ABNORMAL LOW (ref 3.5–5.0)
Alkaline Phosphatase: 123 U/L (ref 38–126)
Anion gap: 13 (ref 5–15)
BUN: 22 mg/dL (ref 8–23)
CO2: 23 mmol/L (ref 22–32)
Calcium: 8.5 mg/dL — ABNORMAL LOW (ref 8.9–10.3)
Chloride: 99 mmol/L (ref 98–111)
Creatinine, Ser: 0.82 mg/dL (ref 0.61–1.24)
GFR calc Af Amer: >60 mL/min (ref >60)
GFR calc non Af Amer: >60 mL/min (ref >60)
Glucose, Bld: 248 mg/dL — ABNORMAL HIGH (ref 70–99)
Potassium: 4.3 mmol/L (ref 3.5–5.1)
Sodium: 135 mmol/L (ref 135–145)
Total Bilirubin: 0.7 mg/dL (ref 0.3–1.2)
Total Protein: 7.1 g/dL (ref 6.5–8.1)

## 2019-02-16 LAB — CBG MONITORING, ED
Glucose-Capillary: 215 mg/dL — ABNORMAL HIGH (ref 70–99)
Glucose-Capillary: 161 mg/dL — ABNORMAL HIGH (ref 70–99)
Glucose-Capillary: 185 mg/dL — ABNORMAL HIGH (ref 70–99)
Glucose-Capillary: 203 mg/dL — ABNORMAL HIGH (ref 70–99)

## 2019-02-16 LAB — SEDIMENTATION RATE: Sed Rate: 34 mm/hr — ABNORMAL HIGH (ref 0–16)

## 2019-02-16 LAB — PROCALCITONIN: Procalcitonin: 0.18 ng/mL

## 2019-02-16 LAB — FIBRINOGEN: Fibrinogen: >800 mg/dL — ABNORMAL HIGH (ref 210–475)

## 2019-02-16 LAB — C-REACTIVE PROTEIN: CRP: 15.4 mg/dL — ABNORMAL HIGH (ref <1.0)

## 2019-02-16 LAB — D-DIMER, QUANTITATIVE: D-Dimer, Quant: 1.9 ug/mL-FEU — ABNORMAL HIGH (ref 0.00–0.50)

## 2019-02-16 LAB — MAGNESIUM: Magnesium: 2.6 mg/dL — ABNORMAL HIGH (ref 1.7–2.4)

## 2019-02-16 LAB — HEMOGLOBIN A1C
Hgb A1c MFr Bld: 9.1 % — ABNORMAL HIGH (ref 4.8–5.6)
Mean Plasma Glucose: 214.47 mg/dL

## 2019-02-16 LAB — FERRITIN: Ferritin: 3503 ng/mL — ABNORMAL HIGH (ref 24–336)

## 2019-02-16 LAB — GLUCOSE, CAPILLARY: Glucose-Capillary: 235 mg/dL — ABNORMAL HIGH (ref 70–99)

## 2019-02-16 LAB — PHOSPHORUS: Phosphorus: 3.2 mg/dL (ref 2.5–4.6)

## 2019-02-16 MED ORDER — AEROCHAMBER PLUS FLO-VU LARGE MISC
Status: AC
  Administered 2019-02-16: 1
  Filled 2019-02-16: qty 1

## 2019-02-16 MED ORDER — VITAMIN D 25 MCG (1000 UNIT) PO TABS
1000.0000 [IU] | ORAL_TABLET | Freq: Every day | ORAL | Status: DC
  Administered 2019-02-16 – 2019-02-23 (×8): 1000 [IU] via ORAL
  Filled 2019-02-16 (×8): qty 1

## 2019-02-16 MED ORDER — IPRATROPIUM BROMIDE HFA 17 MCG/ACT IN AERS
2.0000 | INHALATION_SPRAY | Freq: Three times a day (TID) | RESPIRATORY_TRACT | Status: DC
  Administered 2019-02-16 – 2019-02-21 (×15): 2 via RESPIRATORY_TRACT
  Filled 2019-02-16: qty 12.9

## 2019-02-16 MED ORDER — ALBUTEROL SULFATE HFA 108 (90 BASE) MCG/ACT IN AERS
2.0000 | INHALATION_SPRAY | Freq: Three times a day (TID) | RESPIRATORY_TRACT | Status: DC
  Administered 2019-02-16 – 2019-02-21 (×15): 2 via RESPIRATORY_TRACT
  Filled 2019-02-16: qty 6.7

## 2019-02-16 NOTE — ED Notes (Signed)
RN attempted to give report, 5W nurse stated she would call this RN back.

## 2019-02-16 NOTE — ED Notes (Signed)
Pt ambulated with oxygen from hallway into bed. Pt Spo2 dropped to 85 and did not recover quickly. 15L NRB applied for 2 minutes and pt recovered to 95 spo2

## 2019-02-16 NOTE — ED Notes (Signed)
Pt given breakfast tray

## 2019-02-16 NOTE — ED Notes (Signed)
Pt's CBG result was 161. Informed Selinda Eon - RN.

## 2019-02-16 NOTE — Progress Notes (Signed)
PROGRESS NOTE  [name] [id number] DOB: 11-02-1945 DOA: 02/15/2019 PCP: [name], NP  HPI/Recap of past 24 hours: [name] is a 74 y.o. 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  He presented to the ED initially on 1226 on 20 with a chief complaint of a fever and a cough.  He began running a fever that day and associated having cough and did not take anything for the symptoms and did not know of any exposures.  At that time he tested positive for COVID-19 disease but his O2 saturations were 96 to 98% and he was discharged home but subsequently had worsening symptoms and developed worsening cough, nausea and emesis as well as dry heaving and one episode of diarrhea.  Presented today after significant amount of coughing with pain in his chest with coughing, shortness of breath and worsening for the last 2 to 3 days.  He was found to be hypoxic and had worsening symptoms.  Work-up revealed that she opacities in the mid and lower left lung as well as the right lung base consistent with his COVID-19 disease and significantly elevated inflammatory markers.  [facility] was asked admit this patient for acute hypoxic respiratory failure as well as pneumonia secondary to COVID-19 disease  ED Course: In the ED had basic blood work done including a CBC, CMP, elevated inflammatory markers, chest x-ray and blood cultures drawn as well as an EKG.  Given a dose of dexamethasone and [facility] was called to admit this patient.  COVID-19 positive on 02/09/2019.  02/16/19: Seen and examined.  Reports persistent nonproductive cough.  Denies chest pain.   Currently on 5 L to maintain O2 saturation greater than 94%.  Not on oxygen supplementation at baseline.  Assessment/Plan: Active Problems:   Pneumonia due to COVID-19 virus  COVID-19 viral pneumonia Positive COVID-19 on 02/09/2019. Independently reviewed admission chest x-ray which shows bibasilar infiltrates. Continue Decadron and remdesivir We will started on IV antibiotics for presumed community-acquired pneumonia Obtain sputum culture.  If procalcitonin remains low and sputum culture negative consider DC antibiotics. Continue bronchodilators Continue vitamin D3, zinc, vitamin C Continue to maintain O2 saturation greater than 94% Inflammatory markers are trending down, continue to trend  Acute hypoxic respiratory failure secondary to COVID-19 viral pneumonia Not on oxygen supplementation at baseline Currently on 5 L nasal cannula; wean off as tolerated. Management per above  Elevated LFTs likely in the setting of COVID-19 viral infection Hepatitis panel negative AST ALT are trending down Alkaline phosphatase has normalized Inflammatory markers are trending down  Coronary artery disease status post CABG/history of aortic valve replacement with tissue valve/history of repair and replacement of the ascending aorta Continue aspirin 81 mg daily Continue metoprolol 75 mg twice daily Continue Crestor 10 mg nightly Denies any anginal symptoms at the time of this visit.  Paroxysmal A. fib Currently in normal sinus rhythm Not on [facility] for CVA prevention Rate controlled on beta-blocker  continue to monitor on telemetry  Hyperlipidemia Continue Crestor  Type 2 diabetes with hyperglycemia Continue insulin coverage Hemoglobin A1c 9.1 on 02/16/2019.   DVT prophylaxis: Lovenox  subcu daily Code Status: FULL CODE  Family Communication: No family present at bedside  Disposition Plan:  Patient is currently not appropriate for discharge at this time due to ongoing treatment for  COVID-19 viral pneumonia.  Patient will require at least 2 midnights for further evaluation and treatment.   Consults called: None   Objective: Vitals:   02/16/19 0730 02/16/19 0745 02/16/19 1100 02/16/19 1200  BP: (!) 146/71 (!) 143/109 (!) 160/75 (!) 164/71  Pulse: (!) 56 67 61 (!) 57  Resp: (!) 22 (!) 26 (!) 24 (!) 26  Temp:      TempSrc:      SpO2: 98% 98% 95% 98%   No intake or output data in the 24 hours ending 02/16/19 1255 There were no vitals filed for this visit.  Exam:  . General: 74 y.o. year-old male well developed well nourished in no acute distress.  Alert and oriented x3. . Cardiovascular: Regular rate and rhythm with no rubs or gallops.  No thyromegaly or JVD noted.   [name] Kitchen Respiratory: Mild rales at bases with no wheezing noted.  Poor inspiratory effort.   . Abdomen: Soft nontender nondistended with normal bowel sounds x4 quadrants. . Musculoskeletal: Trace lower extremity edema. [name] Kitchen Psychiatry: Mood is appropriate for condition and setting   Data Reviewed: CBC: Recent Labs  Lab 02/15/19 1300 02/16/19 0414  WBC 8.6 4.5  NEUTROABS  --  3.8  HGB 13.5 16.4  HCT 40.4 48.3  MCV 88.6 87.5  PLT 287 [id number]   Basic Metabolic Panel: Recent Labs  Lab 02/15/19 1300 02/15/19 1500 02/16/19 0414  NA 135 135 135  K 4.4 4.4 4.3  CL 98 96* 99  CO2 25 26 23   GLUCOSE 302* 294* 248*  BUN 17 19 22   CREATININE 1.10 1.08 0.82  CALCIUM 8.8* 9.0 8.5*  MG  --   --  2.6*  PHOS  --   --  3.2   GFR: Estimated Creatinine Clearance: 90.7 mL/min (by C-G formula based on SCr of 0.82 mg/dL). Liver Function Tests: Recent Labs  Lab 02/15/19 1500 02/16/19 0414  AST 158* 98*  ALT 144* 116*  ALKPHOS 141* 123  BILITOT 0.9 0.7  PROT 7.6 7.1  ALBUMIN 3.1* 2.8*   No results for input(s): LIPASE, AMYLASE in the last 168 hours. No results for input(s): AMMONIA in the last 168 hours. Coagulation Profile: No results for input(s): INR, PROTIME in the last 168 hours. Cardiac  Enzymes: No results for input(s): CKTOTAL, CKMB, CKMBINDEX, TROPONINI in the last 168 hours. BNP (last 3 results) No results for input(s): PROBNP in the last 8760 hours. HbA1C: Recent Labs    02/16/19 0414  HGBA1C 9.1*   CBG: Recent Labs  Lab 02/15/19 2019 02/15/19 2330 02/16/19 0404 02/16/19 0757 02/16/19 1223  GLUCAP 256* 255* 215* 161* 185*   Lipid Profile: Recent Labs    02/15/19 1500  TRIG 160*   Thyroid Function Tests: No results for input(s): TSH, T4TOTAL, FREET4, T3FREE, THYROIDAB in the last 72 hours. Anemia Panel: Recent Labs    02/15/19 1500 02/16/19 0414  FERRITIN 3,913* 3,503*   Urine analysis:    Component Value Date/Time   COLORURINE YELLOW 02/08/2019 2258   APPEARANCEUR HAZY (A) 02/08/2019 2258   LABSPEC 1.029 02/08/2019 2258   PHURINE 5.0 02/08/2019 2258   GLUCOSEU >=500 (A) 02/08/2019 2258   GLUCOSEU NEGATIVE 04/06/2010 1554   HGBUR NEGATIVE 02/08/2019 2258   [place] 02/08/2019 2258   BILIRUBINUR N 03/24/2016 0905   KETONESUR NEGATIVE 02/08/2019 2258  PROTEINUR 100 (A) 02/08/2019 2258   UROBILINOGEN 0.2 03/24/2016 0905   UROBILINOGEN 1.0 04/03/2014 0058   NITRITE NEGATIVE 02/08/2019 2258   LEUKOCYTESUR NEGATIVE 02/08/2019 2258   Sepsis Labs: @LABRCNTIP (procalcitonin:4,lacticidven:4)  ) Recent Results (from the past 240 hour(s))  Blood culture (routine x 2)     Status: None   Collection Time: 02/08/19 10:45 PM   Specimen: BLOOD RIGHT ARM  Result Value Ref Range Status   Specimen Description BLOOD RIGHT ARM  Final   Special Requests   Final    BOTTLES DRAWN AEROBIC AND ANAEROBIC Blood Culture results may not be optimal due to an excessive volume of blood received in culture bottles   Culture   Final    NO GROWTH 5 DAYS Performed at [facility], [place] [address] [place], [place] [address]    Report Status 02/13/2019 FINAL  Final  Blood culture (routine x 2)     Status: None   Collection Time: 02/08/19 11:10 PM     Specimen: BLOOD RIGHT HAND  Result Value Ref Range Status   Specimen Description BLOOD RIGHT HAND  Final   Special Requests   Final    BOTTLES DRAWN AEROBIC ONLY Blood Culture adequate volume   Culture   Final    NO GROWTH 5 DAYS Performed at [facility], [place] [address] [place], [place] [address]    Report Status 02/13/2019 FINAL  Final  SARS CORONAVIRUS 2 (TAT 6-24 HRS) Nasopharyngeal Nasopharyngeal Swab     Status: Abnormal   Collection Time: 02/09/19  5:33 AM   Specimen: Nasopharyngeal Swab  Result Value Ref Range Status   SARS Coronavirus 2 POSITIVE (A) NEGATIVE Final    Comment: EMAILED TO Aretta Nip RN.@[email or web address]  ON 12.27.2020 BY TCALDWELL MT. (NOTE) SARS-CoV-2 target nucleic acids are DETECTED. The SARS-CoV-2 RNA is generally detectable in upper and lower respiratory specimens during the acute phase of infection. Positive results are indicative of the presence of SARS-CoV-2 RNA. Clinical correlation with patient history and other diagnostic information is  necessary to determine patient infection status. Positive results do not rule out bacterial infection or co-infection with other viruses.  The expected result is Negative. Fact Sheet for Patients: SugarRoll.be Fact Sheet for Healthcare Providers: https://www.woods-mathews.com/ This test is not yet approved or cleared by the [place] FDA and  has been authorized for detection and/or diagnosis of SARS-CoV-2 by FDA under an Emergency Use Authorization (EUA). This EUA will remain  in effect (meaning this test can be used) for the duration of the COVID- 19 declaration under Section 564(b)(1) of the Act, 21 U.S.C. section 360bbb-3(b)(1), unless the authorization is terminated or revoked sooner. Performed at [facility], [place] [address], [place], [place] [address]   Blood Culture (routine x 2)     Status: None (Preliminary result)   Collection Time: 02/15/19  3:00 PM    Specimen: BLOOD  Result Value Ref Range Status   Specimen Description BLOOD LEFT ANTECUBITAL  Final   Special Requests   Final    BOTTLES DRAWN AEROBIC AND ANAEROBIC Blood Culture results may not be optimal due to an inadequate volume of blood received in culture bottles   Culture   Final    NO GROWTH < 24 HOURS Performed at [facility], [place] [address], [place], [place] [address]    Report Status PENDING  Incomplete  Blood Culture (routine x 2)     Status: None (Preliminary result)   Collection Time: 02/15/19  4:06 PM   Specimen: BLOOD LEFT HAND  Result Value Ref Range Status   Specimen Description BLOOD LEFT HAND  Final   Special Requests   Final    BOTTLES DRAWN AEROBIC ONLY Blood Culture results may not be optimal due to an inadequate volume of blood received in culture bottles   Culture   Final    NO GROWTH < 24 HOURS Performed at [place] [address], [place], [place] [address]    Report Status PENDING  Incomplete      Studies: DG Chest Port 1 View  Result Date: 02/15/2019 CLINICAL DATA:  COVID-19 positivity and shortness of breath EXAM: PORTABLE CHEST 1 VIEW COMPARISON:  02/09/2019 FINDINGS: Cardiac shadow is stable. Postsurgical changes are noted. Opacities are noted in the mid and lower left lung as well as the right lung base consistent with the given clinical history. No bony abnormality is noted. IMPRESSION: Patchy opacities consistent with the given clinical history. Electronically Signed   By: Inez Catalina M.D.   On: 02/15/2019 15:52    Scheduled Meds: . vitamin C  500 mg Oral Daily  . aspirin EC  81 mg Oral Daily  . dexamethasone (DECADRON) injection  6 mg Intravenous Q24H  . enoxaparin (LOVENOX) injection  40 mg Subcutaneous Q24H  . famotidine  20 mg Oral BID  . hydrochlorothiazide  25 mg Oral Daily  . insulin aspart  0-15 Units Subcutaneous Q4H  . insulin glargine  25 Units Subcutaneous QHS  . Ipratropium-Albuterol  1 puff Inhalation Q6H   . metoprolol tartrate  75 mg Oral BID  . rosuvastatin  20 mg Oral Daily  . zinc sulfate  220 mg Oral Daily    Continuous Infusions: . azithromycin Stopped (02/15/19 2028)  . cefTRIAXone (ROCEPHIN)  IV Stopped (02/15/19 1900)  . remdesivir 100 mg in NS 100 mL       LOS: 1 day     Kayleen Memos, MD [facility] Pager [phone]  If 7PM-7AM, please contact night-coverage [email or web address] Password [facility] 02/16/2019, 12:55 PM

## 2019-02-16 NOTE — ED Notes (Signed)
Tele   Breakfast ordered  

## 2019-02-17 LAB — D-DIMER, QUANTITATIVE: D-Dimer, Quant: 1.47 ug/mL-FEU — ABNORMAL HIGH (ref 0.00–0.50)

## 2019-02-17 LAB — PROCALCITONIN: Procalcitonin: 0.18 ng/mL

## 2019-02-17 LAB — FERRITIN: Ferritin: 4642 ng/mL — ABNORMAL HIGH (ref 24–336)

## 2019-02-17 LAB — C-REACTIVE PROTEIN: CRP: 4.7 mg/dL — ABNORMAL HIGH (ref <1.0)

## 2019-02-17 LAB — GLUCOSE, CAPILLARY
Glucose-Capillary: 153 mg/dL — ABNORMAL HIGH (ref 70–99)
Glucose-Capillary: 177 mg/dL — ABNORMAL HIGH (ref 70–99)
Glucose-Capillary: 190 mg/dL — ABNORMAL HIGH (ref 70–99)
Glucose-Capillary: 196 mg/dL — ABNORMAL HIGH (ref 70–99)
Glucose-Capillary: 209 mg/dL — ABNORMAL HIGH (ref 70–99)
Glucose-Capillary: 231 mg/dL — ABNORMAL HIGH (ref 70–99)

## 2019-02-17 LAB — SEDIMENTATION RATE: Sed Rate: 103 mm/hr — ABNORMAL HIGH (ref 0–16)

## 2019-02-17 LAB — LACTATE DEHYDROGENASE: LDH: 411 U/L — ABNORMAL HIGH (ref 98–192)

## 2019-02-17 LAB — FIBRINOGEN: Fibrinogen: 771 mg/dL — ABNORMAL HIGH (ref 210–475)

## 2019-02-17 MED ORDER — INFLUENZA VAC A&B SA ADJ QUAD 0.5 ML IM PRSY
0.5000 mL | PREFILLED_SYRINGE | INTRAMUSCULAR | Status: DC
  Filled 2019-02-17: qty 0.5

## 2019-02-17 MED ORDER — INSULIN ASPART 100 UNIT/ML ~~LOC~~ SOLN
4.0000 [IU] | Freq: Three times a day (TID) | SUBCUTANEOUS | Status: DC
  Administered 2019-02-17 – 2019-02-23 (×16): 4 [IU] via SUBCUTANEOUS

## 2019-02-17 MED ORDER — FUROSEMIDE 10 MG/ML IJ SOLN
40.0000 mg | Freq: Every day | INTRAMUSCULAR | Status: DC
  Administered 2019-02-17 – 2019-02-19 (×3): 40 mg via INTRAVENOUS
  Filled 2019-02-17 (×3): qty 4

## 2019-02-17 MED ORDER — METOPROLOL TARTRATE 25 MG PO TABS
25.0000 mg | ORAL_TABLET | Freq: Two times a day (BID) | ORAL | Status: DC
  Administered 2019-02-17 (×2): 25 mg via ORAL
  Filled 2019-02-17 (×2): qty 1

## 2019-02-17 MED ORDER — INSULIN ASPART 100 UNIT/ML ~~LOC~~ SOLN
0.0000 [IU] | Freq: Every day | SUBCUTANEOUS | Status: DC
  Administered 2019-02-19: 4 [IU] via SUBCUTANEOUS
  Administered 2019-02-20: 2 [IU] via SUBCUTANEOUS
  Administered 2019-02-22: 3 [IU] via SUBCUTANEOUS

## 2019-02-17 MED ORDER — INSULIN ASPART 100 UNIT/ML ~~LOC~~ SOLN
0.0000 [IU] | Freq: Three times a day (TID) | SUBCUTANEOUS | Status: DC
  Administered 2019-02-17: 18:00:00 5 [IU] via SUBCUTANEOUS
  Administered 2019-02-18: 3 [IU] via SUBCUTANEOUS
  Administered 2019-02-18: 5 [IU] via SUBCUTANEOUS
  Administered 2019-02-18: 3 [IU] via SUBCUTANEOUS
  Administered 2019-02-19: 11 [IU] via SUBCUTANEOUS
  Administered 2019-02-19 – 2019-02-20 (×3): 8 [IU] via SUBCUTANEOUS
  Administered 2019-02-20: 11 [IU] via SUBCUTANEOUS
  Administered 2019-02-20: 5 [IU] via SUBCUTANEOUS
  Administered 2019-02-21 (×2): 3 [IU] via SUBCUTANEOUS
  Administered 2019-02-21: 13:00:00 5 [IU] via SUBCUTANEOUS
  Administered 2019-02-22: 13:00:00 8 [IU] via SUBCUTANEOUS
  Administered 2019-02-22: 11 [IU] via SUBCUTANEOUS
  Administered 2019-02-22: 8 [IU] via SUBCUTANEOUS

## 2019-02-17 NOTE — Progress Notes (Signed)
Updated patient's daughter, Yetta Flock, via phone.  All questions answered.

## 2019-02-17 NOTE — Plan of Care (Signed)
  Problem: Education: Goal: Knowledge of General Education information will improve Description: Including pain rating scale, medication(s)/side effects and non-pharmacologic comfort measures Outcome: Progressing   Problem: Clinical Measurements: Goal: Ability to maintain clinical measurements within normal limits will improve Outcome: Progressing Goal: Respiratory complications will improve Outcome: Progressing Goal: Cardiovascular complication will be avoided Outcome: Progressing   Problem: Nutrition: Goal: Adequate nutrition will be maintained Outcome: Progressing   Problem: Coping: Goal: Level of anxiety will decrease Outcome: Progressing   Problem: Elimination: Goal: Will not experience complications related to bowel motility Outcome: Progressing

## 2019-02-17 NOTE — Progress Notes (Signed)
PROGRESS NOTE  [name] [id number] DOB: May 19, 1945 DOA: 02/15/2019 PCP: [name], NP  HPI/Recap of past 24 hours: [name] is a 74 y.o. 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  At that time he tested positive for COVID-19 viral infection but his O2 saturations were 96 to 98% on room air and he was discharged home.  Had worsening symptoms and developed worsening cough, nausea, emesis and one episode of diarrhea for the past 2 to 3 days.  Work-up revealed opacities in the mid and lower left lung as well as the right lung base consistent with COVID-19 viral pneumonia.  Had significantly elevated inflammatory markers.  TRH was asked to admit.  COVID-19 positive on 02/09/2019.  02/17/19: Seen and examined.  Hypoxic in the room with O2 saturation in the high 80s and low 90s on 6 L.  Added IV Lasix.  Assessment/Plan: Active Problems:   Pneumonia due to COVID-19 virus  COVID-19 viral pneumonia Positive COVID-19 on 02/09/2019. Independently reviewed admission chest x-ray which shows bibasilar infiltrates. Continue Decadron and remdesivir Completed 3 days of IV antibiotics for Presumed community-acquired pneumonia Negative procalcitonin.  IV antibiotic DC on 02/17/2019. Continue bronchodilators albuterol inhalers 2 puffs 3 times daily and ipratropium inhaler 2 puffs 3 times daily Continue vitamin D3, zinc, vitamin C Start incentive spirometer and flutter valve Continue to maintain O2 saturation greater than 94% Daily inflammatory markers, continue to trend.  Acute hypoxic respiratory failure secondary to COVID-19 viral pneumonia Not on oxygen supplementation at baseline Currently on 6 L nasal cannula; wean off as tolerated. Management  per above  Type 2 diabetes with hyperglycemia Hemoglobin A1c 9.1 on 02/16/2019. Continue Lantus 25 units nightly Added NovoLog 4 units 3 times daily Continue insulin sliding scale, moderate scale Continue to monitor CBGs and avoid hypoglycemia  Elevated LFTs likely in the setting of COVID-19 viral infection Acute hepatitis panel negative LFTs continue to trend down  Coronary artery disease status post CABG/history of aortic valve replacement with tissue valve/history of repair and replacement of the ascending aorta Continue aspirin 81 mg daily Continue metoprolol 75 mg twice daily Continue Crestor 10 mg nightly Denies anginal symptoms.  Chronic diastolic CHF Last 2D echo done in 2017 revealed normal LVEF with grade 2 diastolic dysfunction. Continue strict I's and O's and daily weight Continue cardiac medications BNP slightly elevated, no baseline to compare Added IV Lasix 40 mg daily Net I&O -240cc  Paroxysmal A. fib Currently in normal sinus rhythm Not on [facility] for CVA prevention Rate controlled on beta-blocker  continue to monitor on telemetry  Hyperlipidemia Continue Crestor  Type 2 diabetes with hyperglycemia Continue insulin coverage Hemoglobin A1c 9.1 on 02/16/2019.  Physical debility PT OT to assess Continue PT OT with assistance and fall precautions.   DVT prophylaxis: Lovenox  subcu daily Code Status: FULL CODE  Family Communication:  Will call his daughter for update. Disposition Plan:  Patient is currently not appropriate for discharge at this time due to ongoing treatment for COVID-19 viral pneumonia.    Consults called: None   Objective: Vitals:   02/17/19 0448 02/17/19 0754 02/17/19 0800 02/17/19 1200  BP:   (!) 149/71 (!) 159/76  Pulse:   60 (!) 54  Resp:   (!) 24 (!) 22  Temp: 98.6 F (37 C) 98.3 F (36.8 C) 98.3 F (36.8 C) 98.2 F (36.8 C)  TempSrc: Oral Oral Oral   SpO2:   (!) 87% 96%  Weight: 83.9 kg     Height:        Intake/Output  Summary (Last 24 hours) at 02/17/2019 1438 Last data filed at 02/17/2019 1355 Gross per 24 hour  Intake 710 ml  Output 950 ml  Net -240 ml   Filed Weights   02/16/19 1847 02/17/19 0448  Weight: 84.2 kg 83.9 kg    Exam:  . General: 74 y.o. year-old male pleasant well-developed well-nourished no acute distress.  Alert and oriented x3. . Cardiovascular: Regular rate and rhythm no rubs or gallops.  No JVD or thyromegaly noted.   [name] Kitchen Respiratory: Mild rales at bases no wheezing noted.   . Abdomen: Soft nontender normal bowel sounds present.   . Musculoskeletal: Trace lower extremity edema bilaterally.   [name] Kitchen Psychiatry: Mood is appropriate for condition and setting.  Data Reviewed: CBC: Recent Labs  Lab 02/15/19 1300 02/16/19 0414  WBC 8.6 4.5  NEUTROABS  --  3.8  HGB 13.5 16.4  HCT 40.4 48.3  MCV 88.6 87.5  PLT 287 [id number]   Basic Metabolic Panel: Recent Labs  Lab 02/15/19 1300 02/15/19 1500 02/16/19 0414  NA 135 135 135  K 4.4 4.4 4.3  CL 98 96* 99  CO2 25 26 23   GLUCOSE 302* 294* 248*  BUN 17 19 22   CREATININE 1.10 1.08 0.82  CALCIUM 8.8* 9.0 8.5*  MG  --   --  2.6*  PHOS  --   --  3.2   GFR: Estimated Creatinine Clearance: 90.7 mL/min (by C-G formula based on SCr of 0.82 mg/dL). Liver Function Tests: Recent Labs  Lab 02/15/19 1500 02/16/19 0414  AST 158* 98*  ALT 144* 116*  ALKPHOS 141* 123  BILITOT 0.9 0.7  PROT 7.6 7.1  ALBUMIN 3.1* 2.8*   No results for input(s): LIPASE, AMYLASE in the last 168 hours. No results for input(s): AMMONIA in the last 168 hours. Coagulation Profile: No results for input(s): INR, PROTIME in the last 168 hours. Cardiac Enzymes: No results for input(s): CKTOTAL, CKMB, CKMBINDEX, TROPONINI in the last 168 hours. BNP (last 3 results) No results for input(s): PROBNP in the last 8760 hours. HbA1C: Recent Labs    02/16/19 0414  HGBA1C 9.1*   CBG: Recent Labs  Lab 02/16/19 2029 02/17/19 0004 02/17/19 0448 02/17/19 0759  02/17/19 1123  GLUCAP 235* 196* 177* 153* 209*   Lipid Profile: Recent Labs    02/15/19 1500  TRIG 160*   Thyroid Function Tests: No results for input(s): TSH, T4TOTAL, FREET4, T3FREE, THYROIDAB in the last 72 hours. Anemia Panel: Recent Labs    02/16/19 0414 02/17/19 0418  FERRITIN 3,503* 4,642*   Urine analysis:    Component Value Date/Time   COLORURINE YELLOW 02/08/2019 2258   APPEARANCEUR HAZY (A) 02/08/2019 2258   LABSPEC 1.029 02/08/2019 2258   PHURINE 5.0 02/08/2019 2258   GLUCOSEU >=500 (A) 02/08/2019 2258   GLUCOSEU NEGATIVE 04/06/2010 1554   HGBUR NEGATIVE 02/08/2019 2258   BILIRUBINUR NEGATIVE 02/08/2019 2258   BILIRUBINUR N 03/24/2016 0905   KETONESUR NEGATIVE 02/08/2019 2258   PROTEINUR 100 (A) 02/08/2019 2258   UROBILINOGEN 0.2 03/24/2016 0905   UROBILINOGEN 1.0 04/03/2014 0058   NITRITE NEGATIVE 02/08/2019 2258   LEUKOCYTESUR NEGATIVE 02/08/2019 2258   Sepsis Labs: @LABRCNTIP (procalcitonin:4,lacticidven:4)  ) Recent Results (from the past 240  hour(s))  Blood culture (routine x 2)     Status: None   Collection Time: 02/08/19 10:45 PM   Specimen: BLOOD RIGHT ARM  Result Value Ref Range Status   Specimen Description BLOOD RIGHT ARM  Final   Special Requests   Final    BOTTLES DRAWN AEROBIC AND ANAEROBIC Blood Culture results may not be optimal due to an excessive volume of blood received in culture bottles   Culture   Final    NO GROWTH 5 DAYS Performed at [facility], [place] [address], [place], [place] [address]    Report Status 02/13/2019 FINAL  Final  Blood culture (routine x 2)     Status: None   Collection Time: 02/08/19 11:10 PM   Specimen: BLOOD RIGHT HAND  Result Value Ref Range Status   Specimen Description BLOOD RIGHT HAND  Final   Special Requests   Final    BOTTLES DRAWN AEROBIC ONLY Blood Culture adequate volume   Culture   Final    NO GROWTH 5 DAYS Performed at [facility], [place] [address], [place], [place]  [address]    Report Status 02/13/2019 FINAL  Final  SARS CORONAVIRUS 2 (TAT 6-24 HRS) Nasopharyngeal Nasopharyngeal Swab     Status: Abnormal   Collection Time: 02/09/19  5:33 AM   Specimen: Nasopharyngeal Swab  Result Value Ref Range Status   SARS Coronavirus 2 POSITIVE (A) NEGATIVE Final    Comment: EMAILED TO Aretta Nip RN.@[email or web address]  ON 12.27.2020 BY TCALDWELL MT. (NOTE) SARS-CoV-2 target nucleic acids are DETECTED. The SARS-CoV-2 RNA is generally detectable in upper and lower respiratory specimens during the acute phase of infection. Positive results are indicative of the presence of SARS-CoV-2 RNA. Clinical correlation with patient history and other diagnostic information is  necessary to determine patient infection status. Positive results do not rule out bacterial infection or co-infection with other viruses.  The expected result is Negative. Fact Sheet for Patients: SugarRoll.be Fact Sheet for Healthcare Providers: https://www.woods-mathews.com/ This test is not yet approved or cleared by the [place] FDA and  has been authorized for detection and/or diagnosis of SARS-CoV-2 by FDA under an Emergency Use Authorization (EUA). This EUA will remain  in effect (meaning this test can be used) for the duration of the COVID- 19 declaration under Section 564(b)(1) of the Act, 21 U.S.C. section 360bbb-3(b)(1), unless the authorization is terminated or revoked sooner. Performed at [facility], [place] [address], [place], [place] [address]   Blood Culture (routine x 2)     Status: None (Preliminary result)   Collection Time: 02/15/19  3:00 PM   Specimen: BLOOD  Result Value Ref Range Status   Specimen Description BLOOD LEFT ANTECUBITAL  Final   Special Requests   Final    BOTTLES DRAWN AEROBIC AND ANAEROBIC Blood Culture results may not be optimal due to an inadequate volume of blood received in culture bottles   Culture   Final    NO  GROWTH 2 DAYS Performed at [facility], [place] [address] [place],  [address]    Report Status PENDING  Incomplete  Blood Culture (routine x 2)     Status: None (Preliminary result)   Collection Time: 02/15/19  4:06 PM   Specimen: BLOOD LEFT HAND  Result Value Ref Range Status   Specimen Description BLOOD LEFT HAND  Final   Special Requests   Final    BOTTLES DRAWN AEROBIC ONLY Blood Culture results may not be  optimal due to an inadequate volume of blood received in culture bottles   Culture   Final    NO GROWTH 2 DAYS Performed at [facility], [place] [address], [place], [place] [address]    Report Status PENDING  Incomplete      Studies: No results found.  Scheduled Meds: . albuterol  2 puff Inhalation TID  . vitamin C  500 mg Oral Daily  . aspirin EC  81 mg Oral Daily  . cholecalciferol  1,000 Units Oral Daily  . dexamethasone (DECADRON) injection  6 mg Intravenous Q24H  . enoxaparin (LOVENOX) injection  40 mg Subcutaneous Q24H  . famotidine  20 mg Oral BID  . furosemide  40 mg Intravenous Daily  . insulin aspart  0-15 Units Subcutaneous TID WC  . insulin aspart  0-5 Units Subcutaneous QHS  . insulin aspart  4 Units Subcutaneous TID WC  . insulin glargine  25 Units Subcutaneous QHS  . ipratropium  2 puff Inhalation Q8H  . metoprolol tartrate  25 mg Oral BID  . rosuvastatin  20 mg Oral Daily  . zinc sulfate  220 mg Oral Daily    Continuous Infusions: . remdesivir 100 mg in NS 100 mL 100 mg (02/17/19 0910)     LOS: 2 days     Kayleen Memos, MD [facility] Pager [phone]  If 7PM-7AM, please contact night-coverage [email or web address] Password [facility] 02/17/2019, 2:38 PM

## 2019-02-17 NOTE — Progress Notes (Signed)
Called patient's daughter back for update on father. All questions answered.

## 2019-02-17 NOTE — Progress Notes (Signed)
Inpatient Diabetes Program Recommendations  AACE/ADA: New Consensus Statement on Inpatient Glycemic Control   Target Ranges:  Prepandial:   less than 140 mg/dL      Peak postprandial:   less than 180 mg/dL (1-2 hours)      Critically ill patients:  140 - 180 mg/dL   Results for [name], [name] (MRN [id number]) as of 02/17/2019 13:13  Ref. Range 02/16/2019 07:57 02/16/2019 12:23 02/16/2019 17:31 02/16/2019 20:29 02/17/2019 00:04 02/17/2019 04:48 02/17/2019 07:59 02/17/2019 11:23  Glucose-Capillary Latest Ref Range: 70 - 99 mg/dL 161 (H) 185 (H) 203 (H) 235 (H) 196 (H) 177 (H) 153 (H) 209 (H)   Review of Glycemic Control  Diabetes history: DM2 Outpatient Diabetes medications: [name] 25 units QHS, Glipizide XL 10 mg QAM, Metformin 1000 BID Current orders for Inpatient glycemic control: Lantus 25 units QHS, Novolog 0-15 units Q4H; Decadron 6 mg Q24H  Inpatient Diabetes Program Recommendations:   Insulin - Basal: If steroids are continued, please consider increasing Lantus to 30 units QHS.  Insulin - Meal Coverage: If steroids are continued, please consider ordering Novolog 4 units TID with meals for meal coverage if patient eats at least 50% of meals.  Thanks, Barnie Alderman, RN, MSN, CDE Diabetes Coordinator Inpatient Diabetes Program [phone] (Team Pager from 8am to 5pm)

## 2019-02-18 ENCOUNTER — Inpatient Hospital Stay (HOSPITAL_COMMUNITY): Payer: Medicare PPO

## 2019-02-18 DIAGNOSIS — I5031 Acute diastolic (congestive) heart failure: Secondary | ICD-10-CM

## 2019-02-18 LAB — ECHOCARDIOGRAM LIMITED
Height: 72 in
Weight: 2888.91 [oz_av]

## 2019-02-18 LAB — C-REACTIVE PROTEIN: CRP: 2.1 mg/dL — ABNORMAL HIGH (ref <1.0)

## 2019-02-18 LAB — SEDIMENTATION RATE: Sed Rate: 103 mm/hr — ABNORMAL HIGH (ref 0–16)

## 2019-02-18 LAB — GLUCOSE, CAPILLARY
Glucose-Capillary: 180 mg/dL — ABNORMAL HIGH (ref 70–99)
Glucose-Capillary: 214 mg/dL — ABNORMAL HIGH (ref 70–99)
Glucose-Capillary: 200 mg/dL — ABNORMAL HIGH (ref 70–99)
Glucose-Capillary: 250 mg/dL — ABNORMAL HIGH (ref 70–99)
Glucose-Capillary: 160 mg/dL — ABNORMAL HIGH (ref 70–99)
Glucose-Capillary: 180 mg/dL — ABNORMAL HIGH (ref 70–99)

## 2019-02-18 LAB — LACTATE DEHYDROGENASE: LDH: 421 U/L — ABNORMAL HIGH (ref 98–192)

## 2019-02-18 LAB — D-DIMER, QUANTITATIVE: D-Dimer, Quant: 1.61 ug/mL-FEU — ABNORMAL HIGH (ref 0.00–0.50)

## 2019-02-18 LAB — FIBRINOGEN: Fibrinogen: 756 mg/dL — ABNORMAL HIGH (ref 210–475)

## 2019-02-18 LAB — FERRITIN: Ferritin: 3700 ng/mL — ABNORMAL HIGH (ref 24–336)

## 2019-02-18 MED ORDER — IOHEXOL 350 MG/ML SOLN
100.0000 mL | Freq: Once | INTRAVENOUS | Status: AC | PRN
  Administered 2019-02-18: 100 mL via INTRAVENOUS

## 2019-02-18 MED ORDER — METOPROLOL TARTRATE 12.5 MG HALF TABLET
12.5000 mg | ORAL_TABLET | Freq: Two times a day (BID) | ORAL | Status: DC
  Administered 2019-02-18 – 2019-02-23 (×11): 12.5 mg via ORAL
  Filled 2019-02-18 (×11): qty 1

## 2019-02-18 MED ORDER — SALINE SPRAY 0.65 % NA SOLN
1.0000 | NASAL | Status: DC | PRN
  Administered 2019-02-18 – 2019-02-20 (×2): 1 via NASAL
  Filled 2019-02-18: qty 44

## 2019-02-18 MED ORDER — INSULIN GLARGINE 100 UNIT/ML ~~LOC~~ SOLN
28.0000 [IU] | Freq: Every day | SUBCUTANEOUS | Status: DC
  Administered 2019-02-18 – 2019-02-19 (×2): 28 [IU] via SUBCUTANEOUS
  Filled 2019-02-18 (×3): qty 0.28

## 2019-02-18 NOTE — Progress Notes (Signed)
  Echocardiogram 2D Echocardiogram has been performed.  [name] 02/18/2019, 4:44 PM

## 2019-02-18 NOTE — Progress Notes (Signed)
PROGRESS NOTE  [name] [id number] DOB: 1945-07-12 DOA: 02/15/2019 PCP: [name], NP  HPI/Recap of past 24 hours: [name] is a 74 y.o. 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  At that time he tested positive for COVID-19 viral infection but his O2 saturations were 96 to 98% on room air and he was discharged home.  Had worsening symptoms and developed worsening cough, nausea, emesis and one episode of diarrhea for the past 2 to 3 days.  Work-up revealed opacities in the mid and lower left lung as well as the right lung base consistent with COVID-19 viral pneumonia.  Had significantly elevated inflammatory markers.  TRH was asked to admit.  COVID-19 positive on 02/09/2019.  02/18/19: Seen and examined.  Persistent hypoxia and generalized weakness.  On high flow nasal cannula 8 L to maintain O2 sat greater than 90%.  CTA negative for PE.  Ongoing diuresing.   Assessment/Plan: Active Problems:   Pneumonia due to COVID-19 virus  COVID-19 viral pneumonia Positive COVID-19 on 02/09/2019. Independently reviewed admission chest x-ray which shows bibasilar infiltrates. Continue Decadron and remdesivir Completed 3 days of IV antibiotics for Presumed community-acquired pneumonia Negative procalcitonin.  IV antibiotic DC on 02/17/2019. Continue bronchodilators albuterol inhalers 2 puffs 3 times daily and ipratropium inhaler 2 puffs 3 times daily Continue vitamin D3, zinc, vitamin C Continue incentive spirometer and flutter valve Now on high flow nasal cannula 8 L Continue to maintain O2 saturation greater than 94% Daily inflammatory markers, continue to trend. Mild elevation in D-dimer.  CTA negative for PE.  Acute hypoxic respiratory  failure secondary to COVID-19 viral pneumonia Not on oxygen supplementation at baseline Now on high flow nasal cannula 8 L to maintain O2 saturation greater than 90% CTA negative for PE.  Type 2 diabetes with hyperglycemia Hemoglobin A1c 9.1 on 02/16/2019. Increased Lantus to 28 units nightly. Continue NovoLog 4 units 3 times daily Continue moderate insulin sliding scale. Appreciate diabetes coordinator assistance. 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  Limited 2D echo ordered and pending Continue aspirin 81 mg daily Continue metoprolol 75 mg twice daily Continue Crestor 10 mg nightly  Chronic diastolic CHF Last 2D echo done in 2017 revealed normal LVEF with grade 2 diastolic dysfunction. Obtain 2D echo, limited on 02/18/2019. Continue strict I's and O's and daily weight Continue cardiac medications BNP elevated, no baseline to compare Ongoing diuresing.  Continue IV Lasix 40 mg daily Monitor electrolytes and blood pressure while on diuretics. Net I&O -1.8 L  Paroxysmal A. fib Currently in normal sinus rhythm Not on [facility] for CVA prevention Rate controlled on beta-blocker  continue to monitor on telemetry  Hyperlipidemia Continue Crestor  Type 2 diabetes with hyperglycemia Continue insulin coverage Hemoglobin A1c 9.1 on 02/16/2019.  Physical debility PT OT recommended home health with PT OT Continue PT OT with assistance and fall precautions. Out of bed to chair with assistance on every shift   DVT prophylaxis: Lovenox  subcu daily Code Status: FULL CODE  Family Communication:  Updated daughter via phone on  02/17/2019. Disposition Plan:  Patient is currently not appropriate for discharge at this time due to ongoing treatment for COVID-19 viral pneumonia.    Consults called: None   Objective: Vitals:   02/18/19 0500 02/18/19 0800 02/18/19 0906 02/18/19 1214  BP:  132/70    Pulse:  82    Resp:  20    Temp:   98.3 F (36.8 C)   TempSrc:   Oral   SpO2:  (!) 86% 92% 92%  Weight: 81.9 kg     Height:        Intake/Output Summary (Last 24 hours) at 02/18/2019 1438 Last data filed at 02/18/2019 0900 Gross per 24 hour  Intake 655.19 ml  Output 2250 ml  Net -1594.81 ml   Filed Weights   02/16/19 1847 02/17/19 0448 02/18/19 0500  Weight: 84.2 kg 83.9 kg 81.9 kg    Exam:  . General: 74 y.o. year-old male pleasant well-developed well-nourished in no acute distress.  Alert oriented x3. . Cardiovascular: Regular rate and rhythm no rubs or gallops.   [name] Kitchen Respiratory: Diffuse rales bilaterally no wheezing noted. . Abdomen: Soft nontender normal bowel sounds present. . Musculoskeletal: Trace lower extremity edema bilaterally.   Psychiatry: Mood is appropriate for condition and setting.   Data Reviewed: CBC: Recent Labs  Lab 02/15/19 1300 02/16/19 0414  WBC 8.6 4.5  NEUTROABS  --  3.8  HGB 13.5 16.4  HCT 40.4 48.3  MCV 88.6 87.5  PLT 287 [id number]   Basic Metabolic Panel: Recent Labs  Lab 02/15/19 1300 02/15/19 1500 02/16/19 0414  NA 135 135 135  K 4.4 4.4 4.3  CL 98 96* 99  CO2 25 26 23   GLUCOSE 302* 294* 248*  BUN 17 19 22   CREATININE 1.10 1.08 0.82  CALCIUM 8.8* 9.0 8.5*  MG  --   --  2.6*  PHOS  --   --  3.2   GFR: Estimated Creatinine Clearance: 90.7 mL/min (by C-G formula based on SCr of 0.82 mg/dL). Liver Function Tests: Recent Labs  Lab 02/15/19 1500 02/16/19 0414  AST 158* 98*  ALT 144* 116*  ALKPHOS 141* 123  BILITOT 0.9 0.7  PROT 7.6 7.1  ALBUMIN 3.1* 2.8*   No results for input(s): LIPASE, AMYLASE in the last 168 hours. No results for input(s): AMMONIA in  the last 168 hours. Coagulation Profile: No results for input(s): INR, PROTIME in the last 168 hours. Cardiac Enzymes: No results for input(s): CKTOTAL, CKMB, CKMBINDEX, TROPONINI in the last 168 hours. BNP (last 3 results) No results for input(s): PROBNP in the last 8760 hours. HbA1C: Recent Labs    02/16/19 0414  HGBA1C 9.1*   CBG: Recent Labs  Lab 02/17/19 2126 02/18/19 0126 02/18/19 0513 02/18/19 0750 02/18/19 1138  GLUCAP 190* 180* 214* 200* 250*   Lipid Profile: Recent Labs    02/15/19 1500  TRIG 160*   Thyroid Function Tests: No results for input(s): TSH, T4TOTAL, FREET4, T3FREE, THYROIDAB in the last 72 hours. Anemia Panel: Recent Labs    02/17/19 0418 02/18/19 0324  FERRITIN 4,642* 3,700*   Urine analysis:    Component Value Date/Time   COLORURINE YELLOW 02/08/2019 2258   APPEARANCEUR HAZY (A) 02/08/2019 2258   LABSPEC 1.029 02/08/2019 2258   PHURINE 5.0 02/08/2019 2258   GLUCOSEU >=500 (A) 02/08/2019 2258   GLUCOSEU NEGATIVE 04/06/2010 1554   [place] 02/08/2019 2258  BILIRUBINUR NEGATIVE 02/08/2019 2258   BILIRUBINUR N 03/24/2016 0905   KETONESUR NEGATIVE 02/08/2019 2258   PROTEINUR 100 (A) 02/08/2019 2258   UROBILINOGEN 0.2 03/24/2016 0905   UROBILINOGEN 1.0 04/03/2014 0058   NITRITE NEGATIVE 02/08/2019 2258   LEUKOCYTESUR NEGATIVE 02/08/2019 2258   Sepsis Labs: @LABRCNTIP (procalcitonin:4,lacticidven:4)  ) Recent Results (from the past 240 hour(s))  Blood culture (routine x 2)     Status: None   Collection Time: 02/08/19 10:45 PM   Specimen: BLOOD RIGHT ARM  Result Value Ref Range Status   Specimen Description BLOOD RIGHT ARM  Final   Special Requests   Final    BOTTLES DRAWN AEROBIC AND ANAEROBIC Blood Culture results may not be optimal due to an excessive volume of blood received in culture bottles   Culture   Final    NO GROWTH 5 DAYS Performed at [facility], [place] [address] [place], [place] [address]    Report  Status 02/13/2019 FINAL  Final  Blood culture (routine x 2)     Status: None   Collection Time: 02/08/19 11:10 PM   Specimen: BLOOD RIGHT HAND  Result Value Ref Range Status   Specimen Description BLOOD RIGHT HAND  Final   Special Requests   Final    BOTTLES DRAWN AEROBIC ONLY Blood Culture adequate volume   Culture   Final    NO GROWTH 5 DAYS Performed at [facility], [place] [address] [place], [place] [address]    Report Status 02/13/2019 FINAL  Final  SARS CORONAVIRUS 2 (TAT 6-24 HRS) Nasopharyngeal Nasopharyngeal Swab     Status: Abnormal   Collection Time: 02/09/19  5:33 AM   Specimen: Nasopharyngeal Swab  Result Value Ref Range Status   SARS Coronavirus 2 POSITIVE (A) NEGATIVE Final    Comment: EMAILED TO Aretta Nip RN.@1453  ON 12.27.2020 BY TCALDWELL MT. (NOTE) SARS-CoV-2 target nucleic acids are DETECTED. The SARS-CoV-2 RNA is generally detectable in upper and lower respiratory specimens during the acute phase of infection. Positive results are indicative of the presence of SARS-CoV-2 RNA. Clinical correlation with patient history and other diagnostic information is  necessary to determine patient infection status. Positive results do not rule out bacterial infection or co-infection with other viruses.  The expected result is Negative. Fact Sheet for Patients: SugarRoll.be Fact Sheet for Healthcare Providers: https://www.woods-mathews.com/ This test is not yet approved or cleared by the [place] FDA and  has been authorized for detection and/or diagnosis of SARS-CoV-2 by FDA under an Emergency Use Authorization (EUA). This EUA will remain  in effect (meaning this test can be used) for the duration of the COVID- 19 declaration under Section 564(b)(1) of the Act, 21 U.S.C. section 360bbb-3(b)(1), unless the authorization is terminated or revoked sooner. Performed at [facility], [place] [address], [place],  [place] [address]   Blood Culture (routine x 2)     Status: None (Preliminary result)   Collection Time: 02/15/19  3:00 PM   Specimen: BLOOD  Result Value Ref Range Status   Specimen Description BLOOD LEFT ANTECUBITAL  Final   Special Requests   Final    BOTTLES DRAWN AEROBIC AND ANAEROBIC Blood Culture results may not be optimal due to an inadequate volume of blood received in culture bottles   Culture   Final    NO GROWTH 3 DAYS Performed at [facility], [place] [address] [place], [place] [address]    Report Status PENDING  Incomplete  Blood Culture (  routine x 2)     Status: None (Preliminary result)   Collection Time: 02/15/19  4:06 PM   Specimen: BLOOD LEFT HAND  Result Value Ref Range Status   Specimen Description BLOOD LEFT HAND  Final   Special Requests   Final    BOTTLES DRAWN AEROBIC ONLY Blood Culture results may not be optimal due to an inadequate volume of blood received in culture bottles   Culture   Final    NO GROWTH 3 DAYS Performed at [facility], [name] [address], [place], [place] [address]    Report Status PENDING  Incomplete      Studies: CT ANGIO CHEST PE W OR WO CONTRAST  Result Date: 02/18/2019 CLINICAL DATA:  74 year old male with chest pain, increasing oxygen dependence. COVID-19. EXAM: CT ANGIOGRAPHY CHEST WITH CONTRAST TECHNIQUE: Multidetector CT imaging of the chest was performed using the standard protocol during bolus administration of intravenous contrast. Multiplanar CT image reconstructions and MIPs were obtained to evaluate the vascular anatomy. CONTRAST:  117mL OMNIPAQUE IOHEXOL 350 MG/ML SOLN COMPARISON:  Portable chest 02/15/2019. CTA chest 11/07/2018 and earlier. FINDINGS: Cardiovascular: Good contrast bolus timing in the pulmonary arterial tree. No focal filling defect identified in the pulmonary arteries to suggest acute pulmonary embolism. Stable postoperative changes to the ascending aorta with ectasia of the proximal arch up to 4.2  centimeters diameter. Little contrast in the aorta today. Extensive coronary artery calcified plaque (series 6, image 167). No cardiomegaly or pericardial effusion. Mediastinum/Nodes: No lymphadenopathy. Lungs/Pleura: Lower lung volumes compared to September with bilateral peripheral and peribronchial ground-glass opacity compatible with COVID-19. Pneumonia. Major airways remain patent. The right middle and upper lobes are least affected. No pleural effusion. Upper Abdomen: Calcified aortic atherosclerosis. Negative visible noncontrast liver, gallbladder, spleen, pancreas, adrenal glands and kidneys. 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 Musculoskeletal: Prior sternotomy. No acute osseous abnormality identified. Review of the MIP images confirms the above findings. IMPRESSION: 1. Negative for acute pulmonary embolus. 2. Lower lung volumes with bilateral peripheral and peribronchial ground-glass opacity compatible with COVID-19 pneumonia. No pleural effusion. 3. Calcified coronary artery and Aortic atherosclerosis (ICD10-I70.0). 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 Electronically Signed   By: Genevie Ann M.D.   On: 02/18/2019 11:58    Scheduled Meds: . albuterol  2 puff Inhalation TID  . vitamin C  500 mg Oral Daily  . aspirin EC  81 mg Oral Daily  . cholecalciferol  1,000 Units Oral Daily  . dexamethasone (DECADRON) injection  6 mg Intravenous Q24H  . enoxaparin (LOVENOX) injection  40 mg Subcutaneous Q24H  . famotidine  20 mg Oral BID  . furosemide  40 mg Intravenous Daily  . influenza vaccine adjuvanted  0.5 mL Intramuscular Tomorrow-1000  . insulin aspart  0-15 Units Subcutaneous TID WC  . insulin aspart  0-5 Units Subcutaneous QHS  . insulin aspart  4 Units Subcutaneous TID WC  . insulin glargine  28 Units Subcutaneous QHS  . ipratropium  2 puff Inhalation Q8H  . metoprolol tartrate  12.5 mg Oral BID  . rosuvastatin  20 mg Oral Daily  . zinc  sulfate  220 mg Oral Daily    Continuous Infusions: . remdesivir 100 mg in NS 100 mL 100 mg (02/18/19 0855)     LOS: 3 days     Kayleen Memos, MD [facility] Pager [phone]  If 7PM-7AM, please contact night-coverage [email or web address] Password [facility] 02/18/2019, 2:38 PM

## 2019-02-18 NOTE — Evaluation (Signed)
Physical Therapy Evaluation Patient Details Name: [name] MRN: [id number] DOB: 30-Apr-1945 Today's Date: 02/18/2019   History of Present Illness  74 yo male seen in ED 12/26 with fever cough and found COVID+ dc home with family. Pt admitted 1/02 due to vomiting diarrhea, c/o CP oxygen saturation RA 84% (hypoxic) .Chest xrays reveal PNA. Pt requiring 3L oxygen 1/2 pt has elevated LFTs 2/2 COVID+  PMH: CABGx4, CAD, DM2, HTN Aortic valve replacement with tissue valve CHF AFIB  Clinical Impression  PTA pt living with daughter independent with mobility and self care, daughter assists with iADLs. Pt is currently limited in safe mobility by decreased awareness of deficits in particular increased O2 demand. Pt requires min guard for bed mobility, and transfers and minA for ambulation of 150 feet without AD. Pt ambulated on 8L O2 via [place] and 2x requires standing rest break with pursed lipped breathing for drop in SaO2 to 96%O2. PT recommending HHPT for decreased strength and endurance. PT will continue to follow acutely.     Follow Up Recommendations Home health PT;Supervision for mobility/OOB    Equipment Recommendations  None recommended by PT    Recommendations for Other Services       Precautions / Restrictions Precautions Precautions: Fall Precaution Comments: Pt quickly desats on room air and is unaware and asymptomatic Restrictions Weight Bearing Restrictions: No      Mobility  Bed Mobility Overal bed mobility: Needs Assistance Bed Mobility: Supine to Sit;Sit to Supine     Supine to sit: Min guard Sit to supine: Min guard   General bed mobility comments: min guard for safety, dizziness which dissipates quickly   Transfers Overall transfer level: Needs assistance Equipment used: None Transfers: Sit to/from Stand Sit to Stand: Min guard   Squat pivot transfers: Min assist     General transfer comment: min guard for safety, no dizziness wiht  standing  Ambulation/Gait Ambulation/Gait assistance: Min assist Gait Distance (Feet): 150 Feet Assistive device: 1 person hand held assist Gait Pattern/deviations: Step-through pattern;Decreased step length - right;Decreased step length - left;Shuffle Gait velocity: slowed Gait velocity interpretation: <1.8 ft/sec, indicate of risk for recurrent falls General Gait Details: minA for steadying with mildly unsteady gait, 2x standing rest breaks for SaO2 86%O2 with cues for pursed lipped breathing, able to rebound to 90%O2 to continue ambulation         Balance Overall balance assessment: Needs assistance   Sitting balance-Leahy Scale: Good Sitting balance - Comments: pt able to lean and reach during grooming activities at sink without LOB   Standing balance support: Bilateral upper extremity supported Standing balance-Leahy Scale: Poor Standing balance comment: min a for balance during squat pivot transfer                             Pertinent Vitals/Pain Pain Assessment: No/denies pain    Home Living Family/patient expects to be discharged to:: Private residence Living Arrangements: Spouse/significant other(daughter assisting) Available Help at Discharge: Family Type of Home: House         Home Equipment: Bedside commode;Walker - 2 wheels;Other (comment)(rollator) Additional Comments: daughter Yetta Flock helping with care at this time   wife passed 6 months ago    Prior Function Level of Independence: Independent(with self care  dsughter does IADL)               Hand Dominance        Extremity/Trunk Assessment   Upper Extremity Assessment  Upper Extremity Assessment: Defer to OT evaluation    Lower Extremity Assessment Lower Extremity Assessment: Generalized weakness;Overall [facility] for tasks assessed    Cervical / Trunk Assessment Cervical / Trunk Assessment: Normal  Communication   Communication: No difficulties  Cognition Arousal/Alertness:  Awake/alert Behavior During Therapy: WFL for tasks assessed/performed Overall Cognitive Status: Within Functional Limits for tasks assessed                                        General Comments General comments (skin integrity, edema, etc.): pt on 8L O2 via HFNC, at rest SaO2 94%O2 with ambulation dropped to 86%O2 requiring rest break and pursed lipped breathing,         Assessment/Plan    PT Assessment Patient needs continued PT services  PT Problem List Decreased strength;Decreased activity tolerance;Decreased balance;Decreased mobility;Decreased safety awareness;Cardiopulmonary status limiting activity       PT Treatment Interventions DME instruction;Gait training;Stair training;Functional mobility training;Therapeutic activities;Therapeutic exercise;Balance training;Cognitive remediation;Patient/family education    PT Goals (Current goals can be found in the Care Plan section)  Acute Rehab PT Goals Patient Stated Goal: to go home PT Goal Formulation: With patient Time For Goal Achievement: 03/04/19 Potential to Achieve Goals: Good    Frequency Min 3X/week    AM-PAC PT "6 Clicks" Mobility  Outcome Measure Help needed turning from your back to your side while in a flat bed without using bedrails?: None Help needed moving from lying on your back to sitting on the side of a flat bed without using bedrails?: None Help needed moving to and from a bed to a chair (including a wheelchair)?: None Help needed standing up from a chair using your arms (e.g., wheelchair or bedside chair)?: None Help needed to walk in hospital room?: A Little Help needed climbing 3-5 steps with a railing? : A Lot 6 Click Score: 21    End of Session Equipment Utilized During Treatment: Gait belt;Oxygen Activity Tolerance: Treatment limited secondary to medical complications (Comment)(oxygen desaturation) Patient left: in bed;with call bell/phone within reach;Other (comment)(ECHO  present) Nurse Communication: Mobility status PT Visit Diagnosis: Unsteadiness on feet (R26.81);Other abnormalities of gait and mobility (R26.89);Muscle weakness (generalized) (M62.81);Difficulty in walking, not elsewhere classified (R26.2)    Time: [id number] PT Time Calculation (min) (ACUTE ONLY): 21 min   Charges:   PT Evaluation $PT Eval Moderate Complexity: 1 Mod          Azaryah Heathcock B. Migdalia Dk PT, DPT Acute Rehabilitation Services Pager [phone] Office [phone]   [place] 02/18/2019, 5:25 PM

## 2019-02-18 NOTE — Evaluation (Signed)
Occupational Therapy Evaluation Patient Details Name: [name] MRN: [id number] DOB: Oct 03, 1945 Today's Date: 02/18/2019    History of Present Illness 74 yo male seen in ED 12/26 with fever cough and found COVID+ dc home with family. Pt admitted 1/02 due to vomiting diarrhea, c/o CP oxygen saturation RA 84% (hypoxic) .Chest xrays reveal PNA. Pt requiring 3L oxygen 1/2 pt has elevated LFTs 2/2 COVID+  PMH: CABGx4, CAD, DM2, HTN Aortic valve replacement with tissue valve CHF AFIB   Clinical Impression   Pt is a pleasant gentleman who currently lives with his daughter (wife deceased 6 months ago) who was independent with all self care activities PTA.  Daughter completes all IADL activities and is available 24/7 per pt.  Pt with sats to 72 on room air with simple seated ADL activities (pt seated at sink with no O2 when therapist arrived) and was asymptomatic. O2 sats remained between mid 80's to low 90's on 10L.  Pt will benefit from [facility] services to maximize independence. Will follow pt for acute OT services until d/c.     Follow Up Recommendations  Home health OT;Supervision/Assistance - 24 hour    Equipment Recommendations  Other (comment)(pt has 3 in 1 that he can also use as shower seat prn. Any other needs TBD)    Recommendations for Other Services       Precautions / Restrictions Precautions Precautions: Fall;Other (comment) Precaution Comments: Pt quickly desats on room air and is unaware and asymptomatic Restrictions Weight Bearing Restrictions: No      Mobility Bed Mobility                  Transfers Overall transfer level: Needs assistance Equipment used: 1 person hand held assist Transfers: Squat Pivot Transfers     Squat pivot transfers: Min assist     General transfer comment: chair to transport chair for CT    Balance Overall balance assessment: Needs assistance   Sitting balance-Leahy Scale: Good Sitting balance - Comments: pt able to lean and  reach during grooming activities at sink without LOB   Standing balance support: Bilateral upper extremity supported Standing balance-Leahy Scale: Poor Standing balance comment: min a for balance during squat pivot transfer                           ADL either performed or assessed with clinical judgement   ADL Overall ADL's : Needs assistance/impaired     Grooming: Wash/dry face;Oral care;Set up(shaving) Grooming Details (indicate cue type and reason): pt with O2 sat of 72 on room air when therapist arrived.  Provided 10L of O2 and pt able to quickly recover to low 90's with cues for pursed lip breathing.  Pt did vary from mid 80's to low 90's on O2 with seated ADL grooming activities. Upper Body Bathing: Set up Upper Body Bathing Details (indicate cue type and reason): simulated Lower Body Bathing: Minimal assistance Lower Body Bathing Details (indicate cue type and reason): simulated Upper Body Dressing : Set up Upper Body Dressing Details (indicate cue type and reason): simulated Lower Body Dressing: Minimal assistance Lower Body Dressing Details (indicate cue type and reason): simulated Toilet Transfer: Minimal assistance           Functional mobility during ADLs: Minimal assistance       Vision         Perception     Praxis      Pertinent Vitals/Pain Pain Assessment: No/denies  pain     Hand Dominance     Extremity/Trunk Assessment Upper Extremity Assessment Upper Extremity Assessment: Overall WFL for tasks assessed(pt able to use BUE's for self care activities and demonstrates full overhead reach)       Cervical / Trunk Assessment Cervical / Trunk Assessment: Normal   Communication Communication Communication: No difficulties   Cognition Arousal/Alertness: Awake/alert Behavior During Therapy: WFL for tasks assessed/performed Overall Cognitive Status: Within Functional Limits for tasks assessed                                      General Comments   Session ended due to pt being transported to CT. Will add any additional pertinent information next session.     Exercises     Shoulder Instructions      Home Living Family/patient expects to be discharged to:: Private residence Living Arrangements: Spouse/significant other(daughter assisting) Available Help at Discharge: Family Type of Home: House             Bathroom Shower/Tub: Tub/shower unit         Home Equipment: Bedside commode;Walker - 2 wheels;Other (comment)(rollator)   Additional Comments: daughter Yetta Flock helping with care at this time   wife passed 6 months ago      Prior Functioning/Environment Level of Independence: Independent(with self care  dsughter does IADL)                 OT Problem List: Decreased strength;Decreased activity tolerance;Impaired balance (sitting and/or standing);Decreased knowledge of use of DME or AE;Other (comment)(decreased education re:O2 levels)      OT Treatment/Interventions: Self-care/ADL training;Therapeutic exercise;DME and/or AE instruction;Therapeutic activities;Patient/family education;Balance training    OT Goals(Current goals can be found in the care plan section) Acute Rehab OT Goals Patient Stated Goal: to go home OT Goal Formulation: With patient Time For Goal Achievement: 03/04/19 Potential to Achieve Goals: Good  OT Frequency: Min 2X/week   Barriers to D/C:            Co-evaluation              AM-PAC OT "6 Clicks" Daily Activity     Outcome Measure Help from another person eating meals?: A Little Help from another person taking care of personal grooming?: A Little Help from another person toileting, which includes using toliet, bedpan, or urinal?: A Little Help from another person bathing (including washing, rinsing, drying)?: A Little Help from another person to put on and taking off regular upper body clothing?: A Little Help from another person to put on and  taking off regular lower body clothing?: A Little 6 Click Score: 18   End of Session    Activity Tolerance: Patient tolerated treatment well(pt requires 10 L of O2 and does desat to mid 80's with ADL activity. Pt reports he did get SOB when walking with nursing from bed to sink.) Patient left: Other (comment)(assisted to transport chair for CT)  OT Visit Diagnosis: Unsteadiness on feet (R26.81);Other (comment)(decreased activity tolerance, overall generalized weakness)                Time: [id number] OT Time Calculation (min): 20 min Charges:  OT General Charges $OT Visit: 1 Visit OT Evaluation $OT Eval Low Complexity: 1 Low   Quay Burow, OTR/L 02/18/2019, 1:57 PM

## 2019-02-18 NOTE — Telephone Encounter (Signed)
Pt currently in ED for acute respiratory failure due to Covid. Unable to schedule pt at this time.

## 2019-02-18 NOTE — Progress Notes (Signed)
Inpatient Diabetes Program Recommendations  AACE/ADA: New Consensus Statement on Inpatient Glycemic Control   Target Ranges:  Prepandial:   less than 140 mg/dL      Peak postprandial:   less than 180 mg/dL (1-2 hours)      Critically ill patients:  140 - 180 mg/dL   Results for LIAHM, TREMONTI (MRN [id number]) as of 02/18/2019 10:48  Ref. Range 02/17/2019 07:59 02/17/2019 11:23 02/17/2019 16:25 02/17/2019 21:26 02/18/2019 01:26 02/18/2019 05:13 02/18/2019 07:50  Glucose-Capillary Latest Ref Range: 70 - 99 mg/dL 153 (H) 209 (H) 231 (H) 190 (H) 180 (H) 214 (H) 200 (H)   Review of Glycemic Control  Diabetes history: DM2 Outpatient Diabetes medications: [name] 25 units QHS, Glipizide XL 10 mg QAM, Metformin 1000 BID Current orders for Inpatient glycemic control: Lantus 25 units QHS, Novolog 0-15 units TID with meals, Novolog 0-5 units QHS, Novolog 4 units TID with meals for meal coverage; Decadron 6 mg Q24H  Inpatient Diabetes Program Recommendations:   Insulin - Basal: If steroids are continued, please consider increasing Lantus to 28 units QHS.  Thanks, Barnie Alderman, RN, MSN, CDE Diabetes Coordinator Inpatient Diabetes Program [phone] (Team Pager from 8am to 5pm)

## 2019-02-18 NOTE — Plan of Care (Signed)

## 2019-02-18 NOTE — TOC Initial Note (Addendum)
Transition of Care [facility]) - Initial/Assessment Note    Patient Details  Name: [name] MRN: [id number] Date of Birth: 11-Jun-1945  [facility] CM/SW Contact:    Maryclare Labrador, RN Phone Number: 02/18/2019, 3:30 PM  Clinical Narrative:    PTA independent from home with daughter. Pt states he has a PCP and denied barriers with paying for discharge medications .  Pt in agreement with HH as ordered .  CM offered medicare.gov HH list choice - pt chose [facility] accepts referral .  CM offered choice for DME - pt chose [facility] - agency accepted referral       Expected Discharge Plan: [place]     Patient Goals and CMS Choice Patient states their goals for this hospitalization and ongoing recovery are:: Pt states he is ready to get home, pt informed CM that he wasn't on oxygen at home PTA and hopes he doesnt need it when he leave the hospital CMS Medicare.gov Compare Post Acute Care list provided to:: Patient    Expected Discharge Plan and Services Expected Discharge Plan: [place]       Living arrangements for the past 2 months: [place]                 DME Arranged: 3-N-1 DME Agency: [facility] Date DME Agency Contacted: 02/18/19 Time DME Agency Contacted: 25 Representative spoke with at DME Agency: [place]: RN, PT, OT, Nurse's Aide [place] Agency: [place] Date [place]: 02/18/19 Time [place]: 1514 Representative spoke with at [place]: [place] Arrangements/Services Living arrangements for the past 2 months: [place] Lives with:: Adult Children Patient language and need for interpreter reviewed:: Yes Do you feel safe going back to the place where you live?: Yes      Need for Family Participation in Patient Care: Yes (Comment) Care giver support system in place?: Yes (comment)   Criminal Activity/Legal Involvement Pertinent to Current  Situation/Hospitalization: No - Comment as needed  Activities of Daily Living Home Assistive Devices/Equipment: None ADL Screening (condition at time of admission) Patient's cognitive ability adequate to safely complete daily activities?: Yes Is the patient deaf or have difficulty hearing?: No Does the patient have difficulty seeing, even when wearing glasses/contacts?: No Does the patient have difficulty concentrating, remembering, or making decisions?: No Patient able to express need for assistance with ADLs?: Yes Does the patient have difficulty dressing or bathing?: No Independently performs ADLs?: No Does the patient have difficulty walking or climbing stairs?: No Weakness of Legs: Both Weakness of Arms/Hands: Both  Permission Sought/Granted   Permission granted to share information with : Yes, Verbal Permission Granted     Permission granted to share info w AGENCY: [facility]        Emotional Assessment   Attitude/Demeanor/Rapport: Engaged, Self-Confident, Charismatic, Gracious Affect (typically observed): Accepting, Adaptable Orientation: : Oriented to Self, Oriented to Place, Oriented to  Time, Oriented to Situation   Psych Involvement: No (comment)  Admission diagnosis:  Acute respiratory failure with hypoxia (HCC) [J96.01] Pneumonia due to COVID-19 virus [U07.1, J12.82] COVID-19 [U07.1] Patient Active Problem List   Diagnosis Date Noted  . Pneumonia due to COVID-19 virus 02/15/2019  . S/P AVR (aortic valve replacement) 11/14/2016  . 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 Uvular swelling 12/06/2015  . Tibial plateau fracture, left, closed, initial encounter 12/02/2015  . Coronary  artery disease involving coronary bypass graft of native heart without angina pectoris   . PAF (paroxysmal atrial fibrillation) ([place]) 11/24/2015  . Orthostatic hypotension   . Fungus present in urine   . Hx of CABG (Sept 2017) 11/13/2015  . Atrial fibrillation (post op  after CABG Sept 2017) 11/13/2015  . Orthostatic syncope 11/13/2015  . Elevated troponin 11/13/2015  . Coronary artery disease involving native coronary artery of native heart without angina pectoris   . Aortic stenosis s/p tissue valve (Sept 2017) 10/28/2015  . Atypical angina ([place]) 10/27/2015  . Abnormal nuclear stress test 10/27/2015  . Coronary artery disease involving native heart with angina pectoris ([place]) 10/27/2015  . Aortic aneurysm ([place]) 07/23/2015  . Carpal tunnel syndrome 09/24/2014  . Left carotid bruit 05/07/2013  . Diabetic retinopathy ([place]) 09/23/2012  . Diminished pulses in lower extremity 06/17/2012  . Chest pain 03/22/2011  . Paresthesia of foot 11/04/2010  . Aortic valve disease 04/06/2010  . HYPERTROPHY PROSTATE W/UR OBST & OTH LUTS 03/15/2009  . Hyperlipidemia 12/13/2007  . Type II diabetes mellitus with neurological manifestations, uncontrolled ([place]) 11/29/2007  . ERECTILE DYSFUNCTION 11/29/2007  . Essential hypertension 11/29/2007   PCP:  [name], NP Pharmacy:   [id number] [place], [address] [place] [address] Phone: [phone] Fax: [phone]     Social Determinants of Health (SDOH) Interventions    Readmission Risk Interventions No flowsheet data found.

## 2019-02-19 LAB — CBC WITH DIFFERENTIAL/PLATELET
Abs Immature Granulocytes: 0.08 10*3/uL — ABNORMAL HIGH (ref 0.00–0.07)
Basophils Absolute: 0 10*3/uL (ref 0.0–0.1)
Basophils Relative: 0 %
Eosinophils Absolute: 0 10*3/uL (ref 0.0–0.5)
Eosinophils Relative: 0 %
HCT: 41.9 % (ref 39.0–52.0)
Hemoglobin: 13.9 g/dL (ref 13.0–17.0)
Immature Granulocytes: 1 %
Lymphocytes Relative: 7 %
Lymphs Abs: 0.8 10*3/uL (ref 0.7–4.0)
MCH: 29.6 pg (ref 26.0–34.0)
MCHC: 33.2 g/dL (ref 30.0–36.0)
MCV: 89.3 fL (ref 80.0–100.0)
Monocytes Absolute: 0.6 10*3/uL (ref 0.1–1.0)
Monocytes Relative: 5 %
Neutro Abs: 11 10*3/uL — ABNORMAL HIGH (ref 1.7–7.7)
Neutrophils Relative %: 87 %
Platelets: 378 10*3/uL (ref 150–400)
RBC: 4.69 MIL/uL (ref 4.22–5.81)
RDW: 13.4 % (ref 11.5–15.5)
WBC: 12.5 10*3/uL — ABNORMAL HIGH (ref 4.0–10.5)
nRBC: 0 % (ref 0.0–0.2)

## 2019-02-19 LAB — LACTATE DEHYDROGENASE: LDH: 352 U/L — ABNORMAL HIGH (ref 98–192)

## 2019-02-19 LAB — FIBRINOGEN: Fibrinogen: 764 mg/dL — ABNORMAL HIGH (ref 210–475)

## 2019-02-19 LAB — COMPREHENSIVE METABOLIC PANEL
ALT: 154 U/L — ABNORMAL HIGH (ref 0–44)
AST: 67 U/L — ABNORMAL HIGH (ref 15–41)
Albumin: 2.8 g/dL — ABNORMAL LOW (ref 3.5–5.0)
Alkaline Phosphatase: 110 U/L (ref 38–126)
Anion gap: 13 (ref 5–15)
BUN: 37 mg/dL — ABNORMAL HIGH (ref 8–23)
CO2: 27 mmol/L (ref 22–32)
Calcium: 9 mg/dL (ref 8.9–10.3)
Chloride: 94 mmol/L — ABNORMAL LOW (ref 98–111)
Creatinine, Ser: 1.1 mg/dL (ref 0.61–1.24)
GFR calc Af Amer: >60 mL/min (ref >60)
GFR calc non Af Amer: >60 mL/min (ref >60)
Glucose, Bld: 247 mg/dL — ABNORMAL HIGH (ref 70–99)
Potassium: 4.8 mmol/L (ref 3.5–5.1)
Sodium: 134 mmol/L — ABNORMAL LOW (ref 135–145)
Total Bilirubin: 0.8 mg/dL (ref 0.3–1.2)
Total Protein: 7.2 g/dL (ref 6.5–8.1)

## 2019-02-19 LAB — D-DIMER, QUANTITATIVE: D-Dimer, Quant: 1.33 ug/mL-FEU — ABNORMAL HIGH (ref 0.00–0.50)

## 2019-02-19 LAB — GLUCOSE, CAPILLARY
Glucose-Capillary: 337 mg/dL — ABNORMAL HIGH (ref 70–99)
Glucose-Capillary: 254 mg/dL — ABNORMAL HIGH (ref 70–99)
Glucose-Capillary: 257 mg/dL — ABNORMAL HIGH (ref 70–99)
Glucose-Capillary: 308 mg/dL — ABNORMAL HIGH (ref 70–99)

## 2019-02-19 LAB — C-REACTIVE PROTEIN: CRP: 1.7 mg/dL — ABNORMAL HIGH (ref <1.0)

## 2019-02-19 LAB — FERRITIN: Ferritin: 2556 ng/mL — ABNORMAL HIGH (ref 24–336)

## 2019-02-19 MED ORDER — ORAL CARE MOUTH RINSE
15.0000 mL | Freq: Two times a day (BID) | OROMUCOSAL | Status: DC
  Administered 2019-02-19 – 2019-02-23 (×9): 15 mL via OROMUCOSAL

## 2019-02-19 MED ORDER — FUROSEMIDE 10 MG/ML IJ SOLN
40.0000 mg | Freq: Two times a day (BID) | INTRAMUSCULAR | Status: DC
  Administered 2019-02-19 – 2019-02-21 (×6): 40 mg via INTRAVENOUS
  Filled 2019-02-19 (×6): qty 4

## 2019-02-19 MED ORDER — HYDROCOD POLST-CPM POLST ER 10-8 MG/5ML PO SUER
5.0000 mL | Freq: Two times a day (BID) | ORAL | Status: AC
  Administered 2019-02-19 – 2019-02-21 (×6): 5 mL via ORAL
  Filled 2019-02-19 (×6): qty 5

## 2019-02-19 NOTE — Progress Notes (Signed)
Updated the patient's daughter [name] via phone.  All questions answered.

## 2019-02-19 NOTE — Plan of Care (Signed)

## 2019-02-19 NOTE — Progress Notes (Signed)
   Vital Signs MEWS/VS Documentation      02/18/2019 1338 02/18/2019 1430 02/18/2019 1900 02/18/2019 2038   MEWS Score:  0  --  0  0   MEWS Score Color:  Green  --  Green  Green   Resp:  18  --  --  16   Pulse:  66  --  --  67   BP:  133/74  --  --  (!) 145/77   Temp:  98.3 F (36.8 C)  --  --  98.1 F (36.7 C)   O2 Device:  HFNC  HFNC  --  HFNC   O2 Flow Rate (L/min):  10 L/min  8 L/min  --  8 L/min   Level of Consciousness:  --  --  --  Vonzell Schlatter D Dorrie Cocuzza 02/19/2019,3:38 AM

## 2019-02-19 NOTE — Progress Notes (Signed)
PROGRESS NOTE  [name] [id number] DOB: October 07, 1945 DOA: 02/15/2019 PCP: [name], NP  HPI/Recap of past 24 hours: [name] is a 74 y.o. 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  At that time he tested positive for COVID-19 viral infection but his O2 saturations were 96 to 98% on room air and he was discharged home.  Had worsening symptoms and developed worsening cough, nausea, emesis and one episode of diarrhea for the past 2 to 3 days.  Work-up revealed opacities in the mid and lower left lung as well as the right lung base consistent with COVID-19 viral pneumonia.  Had significantly elevated inflammatory markers.  TRH was asked to admit.  COVID-19 positive on 02/09/2019.  02/19/19: Seen and examined.  Dyspneic with ambulation.  Requiring high flow nasal cannula 10 L to maintain O2 saturation greater than 92%.  Ongoing diuresing.  Increased IV Lasix to 40 mg twice daily.  Out of bed to chair as tolerated with assistance and fall precautions.  Incentive spirometer and flutter valve.    Assessment/Plan: Active Problems:   Pneumonia due to COVID-19 virus  COVID-19 viral pneumonia Positive COVID-19 on 02/09/2019. Reviewed CTA PE done on 02/17/2018 which showed groundglass opacities.  No evidence of PE. Continue Decadron and remdesivir Completed 3 days of IV antibiotics for Presumed community-acquired pneumonia Negative procalcitonin.  IV antibiotic DC on 02/17/2019. Continue bronchodilators albuterol inhalers 2 puffs 3 times daily and ipratropium inhaler 2 puffs 3 times daily Continue vitamin D3, zinc, vitamin C Continue incentive spirometer and flutter valve Now on high flow nasal cannula 10 L Increase IV Lasix 40 mg twice daily and  closely monitor blood pressure and electrolytes Maintain O2 saturation greater than 92% Daily inflammatory markers, continue to trend.  Acute hypoxic respiratory failure secondary to COVID-19 viral pneumonia Not on oxygen supplementation at baseline Now on high flow nasal cannula 10 L to maintain O2 saturation greater than 90% CTA negative for PE.  Type 2 diabetes with hyperglycemia Hemoglobin A1c 9.1 on 02/16/2019. Increased Lantus to 28 units nightly. Continue NovoLog 4 units 3 times daily Continue moderate insulin sliding scale. Appreciate diabetes coordinator assistance. 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  Limited 2D echo ordered and pending Continue aspirin 81 mg daily Continue metoprolol 75 mg twice daily Continue Crestor 10 mg nightly  Chronic diastolic CHF Last 2D echo done in 2017 revealed normal LVEF with grade 2 diastolic dysfunction. 2D echo done on 02/18/2019 showed preserved LVEF 50 to 55% Continue strict I's and O's and daily weight Continue cardiac medications BNP elevated, no baseline to compare Ongoing diuresing.  Continue IV Lasix, increase to 40 mg twice daily Monitor electrolytes and blood pressure while on diuretics. Net I&O -4.2 L  Paroxysmal A. fib Currently in normal sinus rhythm Not on [place] for CVA prevention Rate controlled on beta-blocker  continue to monitor on telemetry  Hyperlipidemia Continue Crestor  Type 2 diabetes with hyperglycemia Continue insulin coverage  Hemoglobin A1c 9.1 on 02/16/2019.  Physical debility PT OT recommended home health with PT OT Continue PT OT with assistance and fall  precautions. Out of bed to chair with assistance on every shift   DVT prophylaxis: Lovenox  subcu daily Code Status: FULL CODE  Family Communication:  Updated daughter via phone on 02/17/2019. Disposition Plan:  Patient is currently not appropriate for discharge at this time due to ongoing treatment for COVID-19 viral pneumonia.    Consults called: None   Objective: Vitals:   02/18/19 2038 02/19/19 0444 02/19/19 0800 02/19/19 1143  BP: (!) 145/77 (!) 144/82 132/78   Pulse: 67 67 70   Resp: 16 19 18    Temp: 98.1 F (36.7 C) 97.7 F (36.5 C) 98 F (36.7 C) 97.9 F (36.6 C)  TempSrc: Oral Oral Oral Oral  SpO2: 90% 94% (!) 88%   Weight:  79.9 kg    Height:        Intake/Output Summary (Last 24 hours) at 02/19/2019 1238 Last data filed at 02/19/2019 1147 Gross per 24 hour  Intake 434.88 ml  Output 2650 ml  Net -2215.12 ml   Filed Weights   02/17/19 0448 02/18/19 0500 02/19/19 0444  Weight: 83.9 kg 81.9 kg 79.9 kg    Exam:  . General: 74 y.o. year-old male pleasant well-developed well-nourished no acute distress.  Alert and oriented x3.   . Cardiovascular: Regular rate and rhythm no rubs or gallops.   [name] Kitchen Respiratory: Diffuse rales bilaterally no wheezing noted.  Poor respiratory effort.   . Abdomen: Soft Nontender Normal Bowel Sounds Present. . Musculoskeletal: Trace lower extremity edema bilaterally.  2 out of 4 pulses in all 4 extremities. Psychiatry: Mood is appropriate for condition and setting.  Data Reviewed: CBC: Recent Labs  Lab 02/15/19 1300 02/16/19 0414 02/19/19 0640  WBC 8.6 4.5 12.5*  NEUTROABS  --  3.8 11.0*  HGB 13.5 16.4 13.9  HCT 40.4 48.3 41.9  MCV 88.6 87.5 89.3  PLT 287 252 [id number]   Basic Metabolic Panel: Recent Labs  Lab 02/15/19 1300 02/15/19 1500 02/16/19 0414 02/19/19 0640  NA 135 135 135 134*  K 4.4 4.4 4.3 4.8  CL 98 96* 99 94*  CO2 25 26 23 27   GLUCOSE 302* 294* 248* 247*  BUN 17 19 22  37*  CREATININE 1.10 1.08 0.82 1.10    CALCIUM 8.8* 9.0 8.5* 9.0  MG  --   --  2.6*  --   PHOS  --   --  3.2  --    GFR: Estimated Creatinine Clearance: 67.6 mL/min (by C-G formula based on SCr of 1.1 mg/dL). Liver Function Tests: Recent Labs  Lab 02/15/19 1500 02/16/19 0414 02/19/19 0640  AST 158* 98* 67*  ALT 144* 116* 154*  ALKPHOS 141* 123 110  BILITOT 0.9 0.7 0.8  PROT 7.6 7.1 7.2  ALBUMIN 3.1* 2.8* 2.8*   No results for input(s): LIPASE, AMYLASE in the last 168 hours. No results for input(s): AMMONIA in the last 168 hours. Coagulation Profile: No results for input(s): INR, PROTIME in the last 168 hours. Cardiac Enzymes: No results for input(s): CKTOTAL, CKMB, CKMBINDEX, TROPONINI in the last 168 hours. BNP (last 3 results) No results for input(s): PROBNP in the last 8760 hours. HbA1C: No results for input(s): HGBA1C in the last 72 hours. CBG: Recent Labs  Lab 02/18/19 1138 02/18/19 1719 02/18/19 2232 02/19/19 0754 02/19/19 1146  GLUCAP 250* 160* 180* 337* 254*   Lipid Profile: No results for  input(s): CHOL, HDL, LDLCALC, TRIG, CHOLHDL, LDLDIRECT in the last 72 hours. Thyroid Function Tests: No results for input(s): TSH, T4TOTAL, FREET4, T3FREE, THYROIDAB in the last 72 hours. Anemia Panel: Recent Labs    02/18/19 0324 02/19/19 0640  FERRITIN 3,700* 2,556*   Urine analysis:    Component Value Date/Time   COLORURINE YELLOW 02/08/2019 2258   APPEARANCEUR HAZY (A) 02/08/2019 2258   LABSPEC 1.029 02/08/2019 2258   PHURINE 5.0 02/08/2019 2258   GLUCOSEU >=500 (A) 02/08/2019 2258   GLUCOSEU NEGATIVE 04/06/2010 1554   HGBUR NEGATIVE 02/08/2019 2258   BILIRUBINUR NEGATIVE 02/08/2019 2258   BILIRUBINUR N 03/24/2016 0905   KETONESUR NEGATIVE 02/08/2019 2258   PROTEINUR 100 (A) 02/08/2019 2258   UROBILINOGEN 0.2 03/24/2016 0905   UROBILINOGEN 1.0 04/03/2014 0058   NITRITE NEGATIVE 02/08/2019 2258   LEUKOCYTESUR NEGATIVE 02/08/2019 2258   Sepsis  Labs: @LABRCNTIP (procalcitonin:4,lacticidven:4)  ) Recent Results (from the past 240 hour(s))  Blood Culture (routine x 2)     Status: None (Preliminary result)   Collection Time: 02/15/19  3:00 PM   Specimen: BLOOD  Result Value Ref Range Status   Specimen Description BLOOD LEFT ANTECUBITAL  Final   Special Requests   Final    BOTTLES DRAWN AEROBIC AND ANAEROBIC Blood Culture results may not be optimal due to an inadequate volume of blood received in culture bottles   Culture   Final    NO GROWTH 3 DAYS Performed at [facility], [place] [address], [place], [place] [address]    Report Status PENDING  Incomplete  Blood Culture (routine x 2)     Status: None (Preliminary result)   Collection Time: 02/15/19  4:06 PM   Specimen: BLOOD LEFT HAND  Result Value Ref Range Status   Specimen Description BLOOD LEFT HAND  Final   Special Requests   Final    BOTTLES DRAWN AEROBIC ONLY Blood Culture results may not be optimal due to an inadequate volume of blood received in culture bottles   Culture   Final    NO GROWTH 3 DAYS Performed at [facility], [place] [address] [place], [place] [address]    Report Status PENDING  Incomplete      Studies: ECHOCARDIOGRAM LIMITED  Result Date: 02/18/2019   ECHOCARDIOGRAM LIMITED REPORT   Patient Name:   NATANAEL BERRETTA Date of Exam: 02/18/2019 Medical Rec #:  [id number]      Height:       72.0 in Accession #:    [id number]     Weight:       180.6 lb Date of Birth:  09/27/1945      BSA:          2.04 m Patient Age:    22 years       BP:           132/70 mmHg Patient Gender: M              HR:           68 bpm. Exam Location:  Inpatient  Procedure: Limited Echo Indications:    CHF-Acute Diastolic 0000000  History:        Patient has prior history of Echocardiogram examinations. Aortic                 Valve: A 65mm Magna pericardial aortic valve bioprosthesis                 Procedure Date: 10/29/2015 Aortic  valve replacement with tissue                  valve. COVID-19.  Sonographer:    Clayton Lefort RDCS (AE) Referring Phys: [id number] Kayleen Memos  Sonographer Comments: COVID-19. IMPRESSIONS  1. Left ventricular ejection fraction, by visual estimation, is 50 to 55%. There is moderately increased left ventricular wall thickness.  2. Left ventricular diastolic parameters are consistent with Grade I diastolic dysfunction (impaired relaxation).  3. Global right ventricle has normal systolc function.The right ventricular size is normal.  4. 88mm Magna pericardial aortic valve bioprosthesis valve is present in the aortic position. Procedure Date: 10/29/2015 Echo findings show normal structure and function of the aortic prosthesis. Aortic valve mean gradient measures 6.0 mmHg. No aortic valve regurgitation.  5. No evidence of mitral valve regurgitation.  6. Tricuspid valve regurgitation is trivial.  7. TR signal is inadequate for assessing pulmonary artery systolic pressure.  8. The inferior vena cava is normal in size with <50% respiratory variability, suggesting right atrial pressure of 8 mmHg. FINDINGS  Left Ventricle: Left ventricular ejection fraction, by visual estimation, is 50 to 55%. There is moderately increased left ventricular wall thickness. Left ventricular diastolic parameters are consistent with Grade I diastolic dysfunction (impaired relaxation). Indeterminate filling pressures. Right Ventricle: The right ventricular size is normal. Global RV systolic function is has normal systolic function. Mitral Valve: MV Area by PHT, 2.76 cm. MV PHT, 79.75 msec. No evidence of mitral valve regurgitation. Tricuspid Valve: Tricuspid valve regurgitation is trivial. Aortic Valve: The aortic valve has been repaired/replaced. Aortic valve mean gradient measures 6.0 mmHg. Aortic valve peak gradient measures 12.6 mmHg. 47mm Magna pericardial aortic valve bioprosthesis valve is present in the aortic position. Procedure Date: 10/29/2015 Echo findings show normal structure and  function of the aortic prosthesis. Pulmonic Valve: The pulmonic valve was grossly normal. Pulmonic valve regurgitation is trivial by color flow Doppler. Pulmonic regurgitation is trivial by color flow Doppler. Venous: The inferior vena cava is normal in size with less than 50% respiratory variability, suggesting right atrial pressure of 8 mmHg. Shunts: The interatrial septum was not well visualized.  LEFT VENTRICLE         Normals PLAX 2D LVIDd:         4.48 cm 3.6 cm   Diastology                 Normals LVIDs:         3.06 cm 1.7 cm   LV e' lateral:   5.48 cm/s 6.42 cm/s LV PW:         1.44 cm 1.4 cm   LV E/e' lateral: 9.2       15.4 LV IVS:        1.56 cm 1.3 cm   LV e' medial:    4.43 cm/s 6.96 cm/s LV SV:         55 ml   79 ml    LV E/e' medial:  11.4      6.96 LV SV Index:   26.77   45 ml/m2  IVC IVC diam: 1.62 cm LEFT ATRIUM         Index LA diam:    3.20 cm 1.57 cm/m  AORTIC VALVE                    Normals AV Vmax:           177.75 cm/s AV Vmean:  110.750 cm/s 77 cm/s AV VTI:            0.314 m      3.15 cm2 AV Peak Grad:      12.6 mmHg AV Mean Grad:      6.0 mmHg     3 mmHg LVOT Vmax:         96.00 cm/s LVOT Vmean:        63.700 cm/s  75 cm/s LVOT VTI:          0.163 m      25.3 cm LVOT/AV VTI ratio: 0.52         1  AORTA                 Normals Ao Root diam: 2.80 cm 31 mm Ao Asc diam:  3.50 cm 31 mm MITRAL VALVE              Normals MV Area (PHT): 2.76 cm             SHUNTS MV PHT:        79.75 msec 55 ms     Systemic VTI: 0.16 m MV Decel Time: 275 msec   187 ms MV E velocity: 50.60 cm/s 103 cm/s MV A velocity: 61.30 cm/s 70.3 cm/s MV E/A ratio:  0.83       1.5  Cherlynn Kaiser MD Electronically signed by Cherlynn Kaiser MD Signature Date/Time: 02/18/2019/10:41:54 PM    Final     Scheduled Meds: . albuterol  2 puff Inhalation TID  . vitamin C  500 mg Oral Daily  . aspirin EC  81 mg Oral Daily  . chlorpheniramine-HYDROcodone  5 mL Oral Q12H  . cholecalciferol  1,000 Units Oral Daily  .  dexamethasone (DECADRON) injection  6 mg Intravenous Q24H  . enoxaparin (LOVENOX) injection  40 mg Subcutaneous Q24H  . famotidine  20 mg Oral BID  . furosemide  40 mg Intravenous BID  . influenza vaccine adjuvanted  0.5 mL Intramuscular Tomorrow-1000  . insulin aspart  0-15 Units Subcutaneous TID WC  . insulin aspart  0-5 Units Subcutaneous QHS  . insulin aspart  4 Units Subcutaneous TID WC  . insulin glargine  28 Units Subcutaneous QHS  . ipratropium  2 puff Inhalation Q8H  . mouth rinse  15 mL Mouth Rinse BID  . metoprolol tartrate  12.5 mg Oral BID  . rosuvastatin  20 mg Oral Daily  . zinc sulfate  220 mg Oral Daily    Continuous Infusions:    LOS: 4 days     Kayleen Memos, MD [facility] Pager [phone]  If 7PM-7AM, please contact night-coverage [email or web address] Password [place] 02/19/2019, 12:38 PM

## 2019-02-20 LAB — CBC WITH DIFFERENTIAL/PLATELET
Abs Immature Granulocytes: 0.1 10*3/uL — ABNORMAL HIGH (ref 0.00–0.07)
Basophils Absolute: 0 10*3/uL (ref 0.0–0.1)
Basophils Relative: 0 %
Eosinophils Absolute: 0 10*3/uL (ref 0.0–0.5)
Eosinophils Relative: 0 %
HCT: 42.5 % (ref 39.0–52.0)
Hemoglobin: 14.5 g/dL (ref 13.0–17.0)
Immature Granulocytes: 1 %
Lymphocytes Relative: 6 %
Lymphs Abs: 0.8 10*3/uL (ref 0.7–4.0)
MCH: 29.8 pg (ref 26.0–34.0)
MCHC: 34.1 g/dL (ref 30.0–36.0)
MCV: 87.3 fL (ref 80.0–100.0)
Monocytes Absolute: 0.7 10*3/uL (ref 0.1–1.0)
Monocytes Relative: 5 %
Neutro Abs: 11.4 10*3/uL — ABNORMAL HIGH (ref 1.7–7.7)
Neutrophils Relative %: 88 %
Platelets: 419 10*3/uL — ABNORMAL HIGH (ref 150–400)
RBC: 4.87 MIL/uL (ref 4.22–5.81)
RDW: 13.1 % (ref 11.5–15.5)
WBC: 13 10*3/uL — ABNORMAL HIGH (ref 4.0–10.5)
nRBC: 0 % (ref 0.0–0.2)

## 2019-02-20 LAB — GLUCOSE, CAPILLARY
Glucose-Capillary: 208 mg/dL — ABNORMAL HIGH (ref 70–99)
Glucose-Capillary: 305 mg/dL — ABNORMAL HIGH (ref 70–99)
Glucose-Capillary: 254 mg/dL — ABNORMAL HIGH (ref 70–99)
Glucose-Capillary: 237 mg/dL — ABNORMAL HIGH (ref 70–99)

## 2019-02-20 LAB — COMPREHENSIVE METABOLIC PANEL
ALT: 165 U/L — ABNORMAL HIGH (ref 0–44)
AST: 76 U/L — ABNORMAL HIGH (ref 15–41)
Albumin: 2.9 g/dL — ABNORMAL LOW (ref 3.5–5.0)
Alkaline Phosphatase: 110 U/L (ref 38–126)
Anion gap: 11 (ref 5–15)
BUN: 44 mg/dL — ABNORMAL HIGH (ref 8–23)
CO2: 30 mmol/L (ref 22–32)
Calcium: 9.2 mg/dL (ref 8.9–10.3)
Chloride: 94 mmol/L — ABNORMAL LOW (ref 98–111)
Creatinine, Ser: 1.18 mg/dL (ref 0.61–1.24)
GFR calc Af Amer: >60 mL/min (ref >60)
GFR calc non Af Amer: >60 mL/min (ref >60)
Glucose, Bld: 207 mg/dL — ABNORMAL HIGH (ref 70–99)
Potassium: 4.8 mmol/L (ref 3.5–5.1)
Sodium: 135 mmol/L (ref 135–145)
Total Bilirubin: 0.6 mg/dL (ref 0.3–1.2)
Total Protein: 7.3 g/dL (ref 6.5–8.1)

## 2019-02-20 LAB — CULTURE, BLOOD (ROUTINE X 2)
Culture: NO GROWTH
Culture: NO GROWTH

## 2019-02-20 LAB — FIBRINOGEN: Fibrinogen: 762 mg/dL — ABNORMAL HIGH (ref 210–475)

## 2019-02-20 LAB — D-DIMER, QUANTITATIVE: D-Dimer, Quant: 1.24 ug/mL-FEU — ABNORMAL HIGH (ref 0.00–0.50)

## 2019-02-20 LAB — FERRITIN: Ferritin: 2037 ng/mL — ABNORMAL HIGH (ref 24–336)

## 2019-02-20 LAB — C-REACTIVE PROTEIN: CRP: 1.7 mg/dL — ABNORMAL HIGH (ref <1.0)

## 2019-02-20 LAB — LACTATE DEHYDROGENASE: LDH: 374 U/L — ABNORMAL HIGH (ref 98–192)

## 2019-02-20 MED ORDER — INSULIN GLARGINE 100 UNIT/ML ~~LOC~~ SOLN
32.0000 [IU] | Freq: Every day | SUBCUTANEOUS | Status: DC
  Administered 2019-02-20 – 2019-02-22 (×3): 32 [IU] via SUBCUTANEOUS
  Filled 2019-02-20 (×5): qty 0.32

## 2019-02-20 NOTE — Progress Notes (Signed)
Updated patient's daughter Yetta Flock via phone.  All questions answered.

## 2019-02-20 NOTE — Progress Notes (Signed)
Inpatient Diabetes Program Recommendations  AACE/ADA: New Consensus Statement on Inpatient Glycemic Control (2015)  Target Ranges:  Prepandial:   less than 140 mg/dL      Peak postprandial:   less than 180 mg/dL (1-2 hours)      Critically ill patients:  140 - 180 mg/dL   Lab Results  Component Value Date   GLUCAP 208 (H) 02/20/2019   HGBA1C 9.1 (H) 02/16/2019    Review of Glycemic Control Diabetes history:DM2 Outpatient Diabetes medications:[name] 25 units QHS, Glipizide XL 10 mg QAM, Metformin 1000 BID Current orders for Inpatient glycemic control:Lantus 25 units QHS, Novolog 0-15 units TID with meals, Novolog 0-5 units QHS, Novolog 4 units TID with meals for meal coverage; Decadron 6 mg Q24H  Inpatient Diabetes Program Recommendations:  Insulin - Basal:If steroids are continued, consider increasing Lantus to 32 units QHS.   Thanks, Bronson Curb, MSN, RNC-OB Diabetes Coordinator [phone] (8a-5p)

## 2019-02-20 NOTE — Progress Notes (Signed)
Physical Therapy Treatment Patient Details Name: [name] MRN: [id number] DOB: Dec 08, 1945 Today's Date: 02/20/2019    History of Present Illness 74 yo male seen in ED 12/26 with fever cough and found COVID+ dc home with family. Pt admitted 1/02 due to vomiting diarrhea, c/o CP oxygen saturation RA 84% (hypoxic) .Chest xrays reveal PNA. Pt requiring 3L oxygen 1/2 pt has elevated LFTs 2/2 COVID+  PMH: CABGx4, CAD, DM2, HTN Aortic valve replacement with tissue valve CHF AFIB    PT Comments    Pt in bed on entry on 5L O2 via [place], took a sip of water before mobilizing and choked on it, causing increased coughing with drop in SaO2 to 83%O2. Pt ultimately had to ambulate on 8L O2 via [place] to maintain SaO2 >88%O2. Pt is min guard for bed mobility and transfers and min A for ambulation of 250 feet. D/c plan remains appropriate at this time. PT will continue to follow acutely.    Follow Up Recommendations  Home health PT;Supervision for mobility/OOB     Equipment Recommendations  None recommended by PT       Precautions / Restrictions Precautions Precautions: Fall Precaution Comments: Pt quickly desats on room air and is unaware and asymptomatic Restrictions Weight Bearing Restrictions: No    Mobility  Bed Mobility Overal bed mobility: Needs Assistance Bed Mobility: Supine to Sit;Sit to Supine     Supine to sit: Min guard     General bed mobility comments: min guard for safety  Transfers Overall transfer level: Needs assistance Equipment used: None Transfers: Sit to/from Stand Sit to Stand: Min guard         General transfer comment: min guard for safety,   Ambulation/Gait Ambulation/Gait assistance: Min assist Gait Distance (Feet): 200 Feet Assistive device: 1 person hand held assist Gait Pattern/deviations: Step-through pattern;Decreased step length - right;Decreased step length - left;Shuffle Gait velocity: slowed Gait velocity interpretation: 1.31 - 2.62 ft/sec,  indicative of limited community ambulator General Gait Details: minA for steadying with mildly unsteady gait        Balance Overall balance assessment: Needs assistance   Sitting balance-Leahy Scale: Good     Standing balance support: No upper extremity supported;During functional activity Standing balance-Leahy Scale: Fair                              Cognition Arousal/Alertness: Awake/alert Behavior During Therapy: WFL for tasks assessed/performed Overall Cognitive Status: Within Functional Limits for tasks assessed                                           General Comments General comments (skin integrity, edema, etc.): on 5L O2 via [place] on entry 92%O2 via [facility], took sip of water and choked, coughing ensued and once finished SaO2 82%O2, unable to recover with pursed lipped breathing and use of flutter valve and IS. Increased supplemental O2 to 8L pt able to maintain SaO2 in seated 90%O2 with ambulation SaO2 dropped to 85%O2 with sitting back in recliner and pursed lipped breathing able to return to 90%O2 on 5L       Pertinent Vitals/Pain Pain Assessment: No/denies pain           PT Goals (current goals can now be found in the care plan section) Acute Rehab PT Goals Patient Stated Goal: to go home  PT Goal Formulation: With patient Time For Goal Achievement: 03/04/19 Potential to Achieve Goals: Good Progress towards PT goals: Progressing toward goals    Frequency    Min 3X/week      PT Plan Current plan remains appropriate       AM-PAC PT "6 Clicks" Mobility   Outcome Measure  Help needed turning from your back to your side while in a flat bed without using bedrails?: None Help needed moving from lying on your back to sitting on the side of a flat bed without using bedrails?: None Help needed moving to and from a bed to a chair (including a wheelchair)?: None Help needed standing up from a chair using your arms (e.g., wheelchair  or bedside chair)?: None Help needed to walk in hospital room?: A Little Help needed climbing 3-5 steps with a railing? : A Lot 6 Click Score: 21    End of Session Equipment Utilized During Treatment: Gait belt;Oxygen Activity Tolerance: Patient tolerated treatment well Patient left: with call bell/phone within reach;in chair;with chair alarm set Nurse Communication: Mobility status PT Visit Diagnosis: Unsteadiness on feet (R26.81);Other abnormalities of gait and mobility (R26.89);Muscle weakness (generalized) (M62.81);Difficulty in walking, not elsewhere classified (R26.2)     Time: [id number] PT Time Calculation (min) (ACUTE ONLY): 23 min  Charges:  $Gait Training: 23-37 mins                     Manus Weedman B. Migdalia Dk PT, DPT Acute Rehabilitation Services Pager [phone] Office [phone]    [place] 02/20/2019, 1:36 PM

## 2019-02-20 NOTE — Progress Notes (Signed)
PROGRESS NOTE  [name] [id number] DOB: 04-17-1945 DOA: 02/15/2019 PCP: [name], NP  HPI/Recap of past 24 hours: [name] is a 74 y.o. 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  At that time he tested positive for COVID-19 viral infection but his O2 saturations were 96 to 98% on room air and he was discharged home.  Had worsening symptoms and developed worsening cough, nausea, emesis and one episode of diarrhea for the past 2 to 3 days.  Work-up revealed opacities in the mid and lower left lung as well as the right lung base consistent with COVID-19 viral pneumonia.  Had significantly elevated inflammatory markers.  TRH was asked to admit.  COVID-19 positive on 02/09/2019.  02/20/19: Seen and examined.  Feels better with ongoing diuresing.  O2 saturation improving 98% on 5 L from 10 L yesterday.  Continue to mobilize as tolerated.  Continue out of bed to chair with assistance and fall precautions.   Assessment/Plan: Active Problems:   Pneumonia due to COVID-19 virus  COVID-19 viral pneumonia Positive COVID-19 on 02/09/2019. Reviewed CTA PE done on 02/17/2018 which showed groundglass opacities.  No evidence of PE. Continue Decadron and remdesivir Completed 3 days of IV antibiotics for Presumed community-acquired pneumonia Negative procalcitonin.  IV antibiotic DC on 02/17/2019. Continue bronchodilators albuterol inhalers 2 puffs 3 times daily and ipratropium inhaler 2 puffs 3 times daily Continue vitamin D3, zinc, vitamin C Continue incentive spirometer and flutter valve No longer on high flow nasal cannula 10 L, now on 5 L with saturation 98%. Continue diuresing with IV Lasix 40 mg twice daily. Inflammatory markers are trending down,  continue to trend.  Improving acute hypoxic respiratory failure secondary to COVID-19 viral pneumonia Not on oxygen supplementation at baseline CTA negative for PE Management per above  Type 2 diabetes with hyperglycemia Hemoglobin A1c 9.1 on 02/16/2019. Increased Lantus dose to 32 units nightly Continue NovoLog 4 units 3 times daily Continue moderate insulin sliding scale. Appreciate diabetes coordinator assistance. 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  Limited 2D echo ordered and pending Continue aspirin 81 mg daily Continue metoprolol 75 mg twice daily Continue Crestor 10 mg nightly  Chronic diastolic CHF Last 2D echo done in 2017 revealed normal LVEF with grade 2 diastolic dysfunction. 2D echo done on 02/18/2019 showed preserved LVEF 50 to 55% Continue strict I's and O's and daily weight Continue cardiac medications BNP elevated, no baseline to compare Ongoing diuresing.  Continue IV Lasix 40 mg twice daily Monitor electrolytes and blood pressure while on diuretics. Net I&O -6.0L  Paroxysmal A. fib Currently in normal sinus rhythm Not on [facility] for CVA prevention Rate controlled on beta-blocker  continue to monitor on telemetry  Hyperlipidemia Continue Crestor  Type 2 diabetes with hyperglycemia Continue insulin coverage Hemoglobin A1c 9.1 on 02/16/2019.  Physical debility PT OT recommended home health with PT OT Continue PT OT with assistance and fall precautions. Out of  bed to chair with assistance on every shift Appreciate transition of care team assistance with home health services  arrangement.   DVT prophylaxis: Lovenox subcu daily Code Status: FULL CODE  Family Communication:  Updated daughter via phone on 02/17/2019. Disposition Plan:  Patient is currently not appropriate for discharge at this time due to ongoing treatment for COVID-19 viral pneumonia.  Planning for discharge once respiratory status is close to baseline.  Consults called: None   Objective: Vitals:   02/20/19 0500 02/20/19 0600 02/20/19 0642 02/20/19 0800  BP:    134/89  Pulse:  (!) 57 (!) 57 72  Resp:  15 13 17   Temp: 97.6 F (36.4 C)     TempSrc: Oral     SpO2:  91% 95% 94%  Weight: 80.4 kg     Height:        Intake/Output Summary (Last 24 hours) at 02/20/2019 1354 Last data filed at 02/20/2019 1149 Gross per 24 hour  Intake 1080 ml  Output 2850 ml  Net -1770 ml   Filed Weights   02/18/19 0500 02/19/19 0444 02/20/19 0500  Weight: 81.9 kg 79.9 kg 80.4 kg    Exam:  . General: 74 y.o. year-old male pleasant well-developed well-nourished in no acute distress.  Alert and oriented x3.   . Cardiovascular: Regular rate and rhythm no rubs or gallops.   [name] Kitchen Respiratory: Diffuse rales bilaterally no wheezing noted.   . Abdomen: Soft nontender normal bowel sounds present.   Musculoskeletal: No lower extremity edema bilaterally.   Psychiatry: Mood is appropriate for condition and setting. Data Reviewed: CBC: Recent Labs  Lab 02/15/19 1300 02/16/19 0414 02/19/19 0640 02/20/19 0341  WBC 8.6 4.5 12.5* 13.0*  NEUTROABS  --  3.8 11.0* 11.4*  HGB 13.5 16.4 13.9 14.5  HCT 40.4 48.3 41.9 42.5  MCV 88.6 87.5 89.3 87.3  PLT 287 252 378 [id number]*   Basic Metabolic Panel: Recent Labs  Lab 02/15/19 1300 02/15/19 1500 02/16/19 0414 02/19/19 0640 02/20/19 0341  NA 135 135 135 134* 135  K 4.4 4.4 4.3 4.8 4.8  CL 98 96* 99 94* 94*  CO2 25 26 23 27 30   GLUCOSE 302* 294* 248* 247* 207*  BUN 17 19 22  37* 44*  CREATININE 1.10 1.08 0.82 1.10 1.18  CALCIUM 8.8* 9.0 8.5* 9.0 9.2  MG  --   --  2.6*   --   --   PHOS  --   --  3.2  --   --    GFR: Estimated Creatinine Clearance: 63 mL/min (by C-G formula based on SCr of 1.18 mg/dL). Liver Function Tests: Recent Labs  Lab 02/15/19 1500 02/16/19 0414 02/19/19 0640 02/20/19 0341  AST 158* 98* 67* 76*  ALT 144* 116* 154* 165*  ALKPHOS 141* 123 110 110  BILITOT 0.9 0.7 0.8 0.6  PROT 7.6 7.1 7.2 7.3  ALBUMIN 3.1* 2.8* 2.8* 2.9*   No results for input(s): LIPASE, AMYLASE in the last 168 hours. No results for input(s): AMMONIA in the last 168 hours. Coagulation Profile: No results for input(s): INR, PROTIME in the last 168 hours. Cardiac Enzymes: No results for input(s): CKTOTAL, CKMB, CKMBINDEX, TROPONINI in the last 168 hours. BNP (last 3 results) No results for input(s): PROBNP in the last 8760 hours. HbA1C: No results for input(s): HGBA1C in the last 72 hours. CBG: Recent Labs  Lab 02/19/19 1146 02/19/19 1729 02/19/19 2124 02/20/19 0748 02/20/19 1206  GLUCAP 254* 257* 308* 208* 305*   Lipid Profile:  No results for input(s): CHOL, HDL, LDLCALC, TRIG, CHOLHDL, LDLDIRECT in the last 72 hours. Thyroid Function Tests: No results for input(s): TSH, T4TOTAL, FREET4, T3FREE, THYROIDAB in the last 72 hours. Anemia Panel: Recent Labs    02/19/19 0640 02/20/19 0341  FERRITIN 2,556* 2,037*   Urine analysis:    Component Value Date/Time   COLORURINE YELLOW 02/08/2019 2258   APPEARANCEUR HAZY (A) 02/08/2019 2258   LABSPEC 1.029 02/08/2019 2258   PHURINE 5.0 02/08/2019 2258   GLUCOSEU >=500 (A) 02/08/2019 2258   GLUCOSEU NEGATIVE 04/06/2010 1554   HGBUR NEGATIVE 02/08/2019 2258   BILIRUBINUR NEGATIVE 02/08/2019 2258   BILIRUBINUR N 03/24/2016 0905   KETONESUR NEGATIVE 02/08/2019 2258   PROTEINUR 100 (A) 02/08/2019 2258   UROBILINOGEN 0.2 03/24/2016 0905   UROBILINOGEN 1.0 04/03/2014 0058   NITRITE NEGATIVE 02/08/2019 2258   LEUKOCYTESUR NEGATIVE 02/08/2019 2258   Sepsis  Labs: @LABRCNTIP (procalcitonin:4,lacticidven:4)  ) Recent Results (from the past 240 hour(s))  Blood Culture (routine x 2)     Status: None   Collection Time: 02/15/19  3:00 PM   Specimen: BLOOD  Result Value Ref Range Status   Specimen Description BLOOD LEFT ANTECUBITAL  Final   Special Requests   Final    BOTTLES DRAWN AEROBIC AND ANAEROBIC Blood Culture results may not be optimal due to an inadequate volume of blood received in culture bottles   Culture   Final    NO GROWTH 5 DAYS Performed at [facility], [place] [address] [place], [place] [address]    Report Status 02/20/2019 FINAL  Final  Blood Culture (routine x 2)     Status: None   Collection Time: 02/15/19  4:06 PM   Specimen: BLOOD LEFT HAND  Result Value Ref Range Status   Specimen Description BLOOD LEFT HAND  Final   Special Requests   Final    BOTTLES DRAWN AEROBIC ONLY Blood Culture results may not be optimal due to an inadequate volume of blood received in culture bottles   Culture   Final    NO GROWTH 5 DAYS Performed at [facility], [place] [address], [place], [place] [address]    Report Status 02/20/2019 FINAL  Final      Studies: No results found.  Scheduled Meds: . albuterol  2 puff Inhalation TID  . vitamin C  500 mg Oral Daily  . aspirin EC  81 mg Oral Daily  . chlorpheniramine-HYDROcodone  5 mL Oral Q12H  . cholecalciferol  1,000 Units Oral Daily  . dexamethasone (DECADRON) injection  6 mg Intravenous Q24H  . enoxaparin (LOVENOX) injection  40 mg Subcutaneous Q24H  . famotidine  20 mg Oral BID  . furosemide  40 mg Intravenous BID  . influenza vaccine adjuvanted  0.5 mL Intramuscular Tomorrow-1000  . insulin aspart  0-15 Units Subcutaneous TID WC  . insulin aspart  0-5 Units Subcutaneous QHS  . insulin aspart  4 Units Subcutaneous TID WC  . insulin glargine  32 Units Subcutaneous QHS  . ipratropium  2 puff Inhalation Q8H  . mouth rinse  15 mL Mouth Rinse BID  . metoprolol tartrate   12.5 mg Oral BID  . rosuvastatin  20 mg Oral Daily  . zinc sulfate  220 mg Oral Daily    Continuous Infusions:    LOS: 5 days     Kayleen Memos, MD [facility] Pager [phone]  If 7PM-7AM, please contact night-coverage [email or web address] Password [email or web address] 02/20/2019, 1:54 PM

## 2019-02-21 LAB — CBC WITH DIFFERENTIAL/PLATELET
Abs Immature Granulocytes: 0.12 10*3/uL — ABNORMAL HIGH (ref 0.00–0.07)
Basophils Absolute: 0 10*3/uL (ref 0.0–0.1)
Basophils Relative: 0 %
Eosinophils Absolute: 0 10*3/uL (ref 0.0–0.5)
Eosinophils Relative: 0 %
HCT: 41.4 % (ref 39.0–52.0)
Hemoglobin: 14.2 g/dL (ref 13.0–17.0)
Immature Granulocytes: 1 %
Lymphocytes Relative: 7 %
Lymphs Abs: 0.9 10*3/uL (ref 0.7–4.0)
MCH: 29.8 pg (ref 26.0–34.0)
MCHC: 34.3 g/dL (ref 30.0–36.0)
MCV: 87 fL (ref 80.0–100.0)
Monocytes Absolute: 0.9 10*3/uL (ref 0.1–1.0)
Monocytes Relative: 7 %
Neutro Abs: 11.7 10*3/uL — ABNORMAL HIGH (ref 1.7–7.7)
Neutrophils Relative %: 85 %
Platelets: 502 10*3/uL — ABNORMAL HIGH (ref 150–400)
RBC: 4.76 MIL/uL (ref 4.22–5.81)
RDW: 12.9 % (ref 11.5–15.5)
WBC: 13.7 10*3/uL — ABNORMAL HIGH (ref 4.0–10.5)
nRBC: 0 % (ref 0.0–0.2)

## 2019-02-21 LAB — COMPREHENSIVE METABOLIC PANEL
ALT: 116 U/L — ABNORMAL HIGH (ref 0–44)
AST: 37 U/L (ref 15–41)
Albumin: 2.9 g/dL — ABNORMAL LOW (ref 3.5–5.0)
Alkaline Phosphatase: 95 U/L (ref 38–126)
Anion gap: 14 (ref 5–15)
BUN: 41 mg/dL — ABNORMAL HIGH (ref 8–23)
CO2: 29 mmol/L (ref 22–32)
Calcium: 9 mg/dL (ref 8.9–10.3)
Chloride: 90 mmol/L — ABNORMAL LOW (ref 98–111)
Creatinine, Ser: 1.17 mg/dL (ref 0.61–1.24)
GFR calc Af Amer: >60 mL/min (ref >60)
GFR calc non Af Amer: >60 mL/min (ref >60)
Glucose, Bld: 311 mg/dL — ABNORMAL HIGH (ref 70–99)
Potassium: 4.3 mmol/L (ref 3.5–5.1)
Sodium: 133 mmol/L — ABNORMAL LOW (ref 135–145)
Total Bilirubin: 0.8 mg/dL (ref 0.3–1.2)
Total Protein: 7 g/dL (ref 6.5–8.1)

## 2019-02-21 LAB — GLUCOSE, CAPILLARY
Glucose-Capillary: 191 mg/dL — ABNORMAL HIGH (ref 70–99)
Glucose-Capillary: 202 mg/dL — ABNORMAL HIGH (ref 70–99)
Glucose-Capillary: 155 mg/dL — ABNORMAL HIGH (ref 70–99)
Glucose-Capillary: 171 mg/dL — ABNORMAL HIGH (ref 70–99)

## 2019-02-21 MED ORDER — FLUTICASONE PROPIONATE 50 MCG/ACT NA SUSP
2.0000 | Freq: Every day | NASAL | Status: AC
  Administered 2019-02-21 – 2019-02-23 (×3): 2 via NASAL
  Filled 2019-02-21: qty 16

## 2019-02-21 MED ORDER — IPRATROPIUM BROMIDE HFA 17 MCG/ACT IN AERS
2.0000 | INHALATION_SPRAY | RESPIRATORY_TRACT | Status: DC | PRN
  Filled 2019-02-21: qty 12.9

## 2019-02-21 NOTE — Progress Notes (Signed)
PROGRESS NOTE  [name] [id number] DOB: 04-17-1945 DOA: 02/15/2019 PCP: [name], NP  HPI/Recap of past 24 hours: [name] is a 74 y.o. 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  At that time he tested positive for COVID-19 viral infection but his O2 saturations were 96 to 98% on room air and he was discharged home.  Had worsening symptoms and developed worsening cough, nausea, emesis and one episode of diarrhea for the past 2 to 3 days.  Work-up revealed opacities in the mid and lower left lung as well as the right lung base consistent with COVID-19 viral pneumonia.  Had significantly elevated inflammatory markers.  TRH was asked to admit.  COVID-19 positive on 02/09/2019.  02/21/19: Seen and examined.  Reports nasal congestion.  Added Flonase.  Net I&O -7.4 L.  Ongoing diuresing.   Assessment/Plan: Active Problems:   Pneumonia due to COVID-19 virus  COVID-19 viral pneumonia Positive COVID-19 on 02/09/2019. Reviewed CTA PE done on 02/17/2018 which showed groundglass opacities.  No evidence of PE. Completed 5 days of remdesivir. Completed 3 days of empiric IV antibiotics for presumed community-acquired pneumonia. Continue bronchodilators Continue incentive spirometer and flutter valve Continue to maintain O2 saturation greater than 92% Currently weaning off oxygen on 4 L with O2 saturation 94%. Ongoing diuresing  Improving acute hypoxic respiratory failure secondary to COVID-19 viral pneumonia Not on oxygen supplementation at baseline CTA negative for PE Management per above  Type 2 diabetes with hyperglycemia Hemoglobin A1c 9.1 on 02/16/2019. Increased Lantus dose to 32 units nightly Increase NovoLog to 6 units 3 times  daily Continue insulin sliding scale Appreciate diabetes coordinator recommendations.  Resolving elevated LFTs likely in the setting of COVID-19 viral infection Acute hepatitis panel negative LFTs trending down and normalizing on repeated lab.  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  Limited 2D echo ordered and pending Continue aspirin 81 mg daily Continue metoprolol 12.5 mg twice daily. Continue Crestor 10 mg nightly  Chronic diastolic CHF Last 2D echo done in 2017 revealed normal LVEF with grade 2 diastolic dysfunction. 2D echo done on 02/18/2019 showed preserved LVEF 50 to 55% Continue strict I's and O's and daily weight Continue cardiac medications BNP elevated, no baseline to compare Ongoing diuresing.  Continue IV Lasix 40 mg twice daily Monitor electrolytes and blood pressure while on diuretics. Net I&O negative.  7.4 L  Paroxysmal A. fib Currently in normal sinus rhythm Not on [facility] for CVA prevention Rate controlled on beta-blocker  continue to monitor on telemetry  Hyperlipidemia Continue Crestor  Type 2 diabetes with hyperglycemia Continue insulin coverage Hemoglobin A1c 9.1 on 02/16/2019.  Physical debility PT OT recommended home health with PT OT Continue PT OT with assistance and fall precautions. Continue out of bed to chair with assistance on every shift Appreciate transition of care team assistance with home health services arrangement.   DVT prophylaxis: Lovenox subcu daily Code Status: FULL CODE  Family Communication:  Updated daughter via phone on 02/17/2019. Disposition Plan:  DC to home with home health services PT OT in the next 24 to 48 hours when O2 requirement is closer to  baseline.  Consults called: None   Objective: Vitals:   02/21/19 0500 02/21/19 0800 02/21/19 0842 02/21/19 1217   BP:  134/81  130/78  Pulse:  75 96   Resp:  16 15   Temp: 97.8 F (36.6 C)     TempSrc: Oral     SpO2:  99% 92%   Weight: 82.9 kg     Height:        Intake/Output Summary (Last 24 hours) at 02/21/2019 1356 Last data filed at 02/21/2019 0908 Gross per 24 hour  Intake 120 ml  Output 1525 ml  Net -1405 ml   Filed Weights   02/19/19 0444 02/20/19 0500 02/21/19 0500  Weight: 79.9 kg 80.4 kg 82.9 kg    Exam:  . General: 74 y.o. year-old male pleasant well-developed well-nourished no acute distress.  Alert and oriented x3. . Cardiovascular: Regular rate and rhythm no rubs or gallops. [name] Kitchen Respiratory: Faint rales at bases no wheezing noted.   . Abdomen: Soft nontender normal bowel sounds present. Musculoskeletal: No lower extremity edema bilaterally. Psychiatry: Mood is appropriate for condition and setting.   Data Reviewed: CBC: Recent Labs  Lab 02/15/19 1300 02/16/19 0414 02/19/19 0640 02/20/19 0341 02/21/19 1023  WBC 8.6 4.5 12.5* 13.0* 13.7*  NEUTROABS  --  3.8 11.0* 11.4* 11.7*  HGB 13.5 16.4 13.9 14.5 14.2  HCT 40.4 48.3 41.9 42.5 41.4  MCV 88.6 87.5 89.3 87.3 87.0  PLT 287 252 378 419* [id number]*   Basic Metabolic Panel: Recent Labs  Lab 02/15/19 1500 02/16/19 0414 02/19/19 0640 02/20/19 0341 02/21/19 1023  NA 135 135 134* 135 133*  K 4.4 4.3 4.8 4.8 4.3  CL 96* 99 94* 94* 90*  CO2 26 23 27 30 29   GLUCOSE 294* 248* 247* 207* 311*  BUN 19 22 37* 44* 41*  CREATININE 1.08 0.82 1.10 1.18 1.17  CALCIUM 9.0 8.5* 9.0 9.2 9.0  MG  --  2.6*  --   --   --   PHOS  --  3.2  --   --   --    GFR: Estimated Creatinine Clearance: 63.6 mL/min (by C-G formula based on SCr of 1.17 mg/dL). Liver Function Tests: Recent Labs  Lab 02/15/19 1500 02/16/19 0414 02/19/19 0640 02/20/19 0341 02/21/19 1023  AST 158* 98* 67* 76* 37  ALT 144* 116* 154* 165* 116*  ALKPHOS 141* 123 110 110 95  BILITOT 0.9 0.7 0.8 0.6 0.8  PROT 7.6 7.1 7.2 7.3 7.0  ALBUMIN 3.1* 2.8* 2.8* 2.9*  2.9*   No results for input(s): LIPASE, AMYLASE in the last 168 hours. No results for input(s): AMMONIA in the last 168 hours. Coagulation Profile: No results for input(s): INR, PROTIME in the last 168 hours. Cardiac Enzymes: No results for input(s): CKTOTAL, CKMB, CKMBINDEX, TROPONINI in the last 168 hours. BNP (last 3 results) No results for input(s): PROBNP in the last 8760 hours. HbA1C: No results for input(s): HGBA1C in the last 72 hours. CBG: Recent Labs  Lab 02/20/19 1206 02/20/19 1746 02/20/19 2112 02/21/19 0753 02/21/19 1217  GLUCAP 305* 254* 237* 191* 202*   Lipid Profile: No results for input(s): CHOL, HDL, LDLCALC, TRIG, CHOLHDL, LDLDIRECT in the last 72 hours. Thyroid Function Tests: No results for input(s): TSH, T4TOTAL, FREET4, T3FREE, THYROIDAB in the last 72 hours. Anemia Panel: Recent Labs    02/19/19 0640 02/20/19 0341  FERRITIN 2,556* 2,037*   Urine analysis:    Component Value Date/Time   COLORURINE YELLOW 02/08/2019 2258  APPEARANCEUR HAZY (A) 02/08/2019 2258   LABSPEC 1.029 02/08/2019 2258   PHURINE 5.0 02/08/2019 2258   GLUCOSEU >=500 (A) 02/08/2019 2258   GLUCOSEU NEGATIVE 04/06/2010 1554   HGBUR NEGATIVE 02/08/2019 2258   BILIRUBINUR NEGATIVE 02/08/2019 2258   BILIRUBINUR N 03/24/2016 0905   KETONESUR NEGATIVE 02/08/2019 2258   PROTEINUR 100 (A) 02/08/2019 2258   UROBILINOGEN 0.2 03/24/2016 0905   UROBILINOGEN 1.0 04/03/2014 0058   NITRITE NEGATIVE 02/08/2019 2258   LEUKOCYTESUR NEGATIVE 02/08/2019 2258   Sepsis Labs: @LABRCNTIP (procalcitonin:4,lacticidven:4)  ) Recent Results (from the past 240 hour(s))  Blood Culture (routine x 2)     Status: None   Collection Time: 02/15/19  3:00 PM   Specimen: BLOOD  Result Value Ref Range Status   Specimen Description BLOOD LEFT ANTECUBITAL  Final   Special Requests   Final    BOTTLES DRAWN AEROBIC AND ANAEROBIC Blood Culture results may not be optimal due to an inadequate volume of blood  received in culture bottles   Culture   Final    NO GROWTH 5 DAYS Performed at [facility], [place] [address] [place], [place] [address]    Report Status 02/20/2019 FINAL  Final  Blood Culture (routine x 2)     Status: None   Collection Time: 02/15/19  4:06 PM   Specimen: BLOOD LEFT HAND  Result Value Ref Range Status   Specimen Description BLOOD LEFT HAND  Final   Special Requests   Final    BOTTLES DRAWN AEROBIC ONLY Blood Culture results may not be optimal due to an inadequate volume of blood received in culture bottles   Culture   Final    NO GROWTH 5 DAYS Performed at [facility], [place] [address] [place], [place] [address]    Report Status 02/20/2019 FINAL  Final      Studies: No results found.  Scheduled Meds: . vitamin C  500 mg Oral Daily  . aspirin EC  81 mg Oral Daily  . chlorpheniramine-HYDROcodone  5 mL Oral Q12H  . cholecalciferol  1,000 Units Oral Daily  . dexamethasone (DECADRON) injection  6 mg Intravenous Q24H  . enoxaparin (LOVENOX) injection  40 mg Subcutaneous Q24H  . famotidine  20 mg Oral BID  . furosemide  40 mg Intravenous BID  . influenza vaccine adjuvanted  0.5 mL Intramuscular Tomorrow-1000  . insulin aspart  0-15 Units Subcutaneous TID WC  . insulin aspart  0-5 Units Subcutaneous QHS  . insulin aspart  4 Units Subcutaneous TID WC  . insulin glargine  32 Units Subcutaneous QHS  . mouth rinse  15 mL Mouth Rinse BID  . metoprolol tartrate  12.5 mg Oral BID  . rosuvastatin  20 mg Oral Daily  . zinc sulfate  220 mg Oral Daily    Continuous Infusions:    LOS: 6 days     Kayleen Memos, MD [facility] Pager [phone]  If 7PM-7AM, please contact night-coverage [email or web address] Password [email or web address] 02/21/2019, 1:56 PM

## 2019-02-21 NOTE — Progress Notes (Signed)
Weaning off oxygen on 3 L with O2 saturation 100%. Patient tolerated well; no acute distress, no complaints at this time. Will continue to monitor.

## 2019-02-22 LAB — CREATININE, SERUM
Creatinine, Ser: 1.27 mg/dL — ABNORMAL HIGH (ref 0.61–1.24)
GFR calc Af Amer: >60 mL/min (ref >60)
GFR calc non Af Amer: 56 mL/min — ABNORMAL LOW (ref >60)

## 2019-02-22 LAB — GLUCOSE, CAPILLARY
Glucose-Capillary: 272 mg/dL — ABNORMAL HIGH (ref 70–99)
Glucose-Capillary: 296 mg/dL — ABNORMAL HIGH (ref 70–99)
Glucose-Capillary: 307 mg/dL — ABNORMAL HIGH (ref 70–99)
Glucose-Capillary: 251 mg/dL — ABNORMAL HIGH (ref 70–99)

## 2019-02-22 LAB — PROCALCITONIN: Procalcitonin: <0.1 ng/mL

## 2019-02-22 LAB — LACTIC ACID, PLASMA: Lactic Acid, Venous: 1.9 mmol/L (ref 0.5–1.9)

## 2019-02-22 MED ORDER — FUROSEMIDE 10 MG/ML IJ SOLN
20.0000 mg | Freq: Two times a day (BID) | INTRAMUSCULAR | Status: DC
  Administered 2019-02-22 – 2019-02-23 (×3): 20 mg via INTRAVENOUS
  Filled 2019-02-22 (×3): qty 2

## 2019-02-22 MED ORDER — DEXAMETHASONE 6 MG PO TABS
6.0000 mg | ORAL_TABLET | Freq: Every day | ORAL | Status: DC
  Administered 2019-02-22 – 2019-02-23 (×2): 6 mg via ORAL
  Filled 2019-02-22 (×2): qty 1

## 2019-02-22 NOTE — Progress Notes (Signed)
PROGRESS NOTE  [name] [id number] DOB: 08-Jun-1945 DOA: 02/15/2019 PCP: [name], NP  HPI/Recap of past 24 hours: [name] is a 73 y.o. 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  At that time he tested positive for COVID-19 viral infection but his O2 saturations were 96 to 98% on room air and he was discharged home.  Had worsening symptoms and developed worsening cough, nausea, emesis and one episode of diarrhea for the past 2 to 3 days.  Work-up revealed opacities in the mid and lower left lung as well as the right lung base consistent with COVID-19 viral pneumonia.  Had significantly elevated inflammatory markers.  TRH was asked to admit.  COVID-19 positive on 02/09/2019.  02/22/19: seen and examined.  Breathing improving. Weaning off O2. Planned home O2 eval today for dc planning. TOC team consulted for home health services assistance.  Ongoing diuresing.     Assessment/Plan: Active Problems:   Pneumonia due to COVID-19 virus  COVID-19 viral pneumonia Positive COVID-19 on 02/09/2019. Reviewed CTA PE done on 02/17/2018 which showed groundglass opacities.  No evidence of PE. Completed 5 days of remdesivir. Completed 3 days of empiric IV antibiotics for presumed community-acquired pneumonia. Continue bronchodilators Continue incentive spirometer and flutter valve Continue to maintain O2 saturation greater than 92% Currently weaning off oxygen on RA with O2 saturation 92% at rest. Ongoing diuresing  Improving acute hypoxic respiratory failure secondary to COVID-19 viral pneumonia Not on oxygen supplementation at baseline CTA negative for PE Management per above Home O2 eval for dc planning  Type 2 diabetes with  hyperglycemia Hemoglobin A1c 9.1 on 02/16/2019. Increased Lantus dose to 32 units nightly Increase NovoLog to 6 units 3 times daily Continue insulin sliding scale Appreciate diabetes coordinator recommendations.  Resolving elevated LFTs likely in the setting of COVID-19 viral infection Acute hepatitis panel negative LFTs trending down and normalizing on repeated lab.  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  Limited 2D echo ordered and pending Continue aspirin 81 mg daily Continue metoprolol 12.5 mg twice daily. Continue Crestor 10 mg nightly  Chronic diastolic CHF Last 2D echo done in 2017 revealed normal LVEF with grade 2 diastolic dysfunction. 2D echo done on 02/18/2019 showed preserved LVEF 50 to 55% Continue strict I's and O's and daily weight Continue cardiac medications BNP elevated, no baseline to compare Ongoing diuresing.  Continue IV Lasix 40 mg twice daily Monitor electrolytes and blood pressure while on diuretics. Net I&O negative 7.8 L  Paroxysmal A. fib Currently in normal sinus rhythm Not on [facility] for CVA prevention Rate controlled on beta-blocker  continue to monitor on telemetry  Hyperlipidemia Continue Crestor  Type 2 diabetes with hyperglycemia Continue insulin coverage Hemoglobin A1c 9.1 on 02/16/2019.  Physical debility PT OT recommended home health with PT OT Continue PT OT with assistance and fall precautions. Continue out of bed to chair with assistance on every shift Appreciate transition of care team assistance with home health services arrangement.   DVT prophylaxis: Lovenox subcu daily Code Status: FULL CODE  Family Communication:  Updated daughter via phone on 02/17/2019. Disposition Plan:  DC to home with  home health services PT OT in the next 24 to 48 hours when O2 requirement is closer  to baseline.  Consults called: None   Objective: Vitals:   02/22/19 0600 02/22/19 0800 02/22/19 0859 02/22/19 1011  BP:  130/80  133/79  Pulse: 67 69 79 72  Resp: 16 19 15    Temp:   97.8 F (36.6 C)   TempSrc:   Oral   SpO2: 94%  93%   Weight:      Height:        Intake/Output Summary (Last 24 hours) at 02/22/2019 1043 Last data filed at 02/22/2019 0900 Gross per 24 hour  Intake 960 ml  Output 1425 ml  Net -465 ml   Filed Weights   02/20/19 0500 02/21/19 0500 02/22/19 0509  Weight: 80.4 kg 82.9 kg 80.2 kg    Exam:  . General: 74 y.o. year-old male Pleasant well developed well nourished in no acute distress. . Cardiovascular: Regular rate and rhythm no rubs or gallops. [name] Kitchen Respiratory: Clear to auscultation mostly with mild rales at bases.  . Abdomen: soft non tender with bowel sounds Musculoskeletal:No LE edema bilaterally Psychiatry: Mood is appropriate for condition and setting.   Data Reviewed: CBC: Recent Labs  Lab 02/15/19 1300 02/16/19 0414 02/19/19 0640 02/20/19 0341 02/21/19 1023  WBC 8.6 4.5 12.5* 13.0* 13.7*  NEUTROABS  --  3.8 11.0* 11.4* 11.7*  HGB 13.5 16.4 13.9 14.5 14.2  HCT 40.4 48.3 41.9 42.5 41.4  MCV 88.6 87.5 89.3 87.3 87.0  PLT 287 252 378 419* [id number]*   Basic Metabolic Panel: Recent Labs  Lab 02/15/19 1500 02/16/19 0414 02/19/19 0640 02/20/19 0341 02/21/19 1023 02/22/19 0418  NA 135 135 134* 135 133*  --   K 4.4 4.3 4.8 4.8 4.3  --   CL 96* 99 94* 94* 90*  --   CO2 26 23 27 30 29   --   GLUCOSE 294* 248* 247* 207* 311*  --   BUN 19 22 37* 44* 41*  --   CREATININE 1.08 0.82 1.10 1.18 1.17 1.27*  CALCIUM 9.0 8.5* 9.0 9.2 9.0  --   MG  --  2.6*  --   --   --   --   PHOS  --  3.2  --   --   --   --    GFR: Estimated Creatinine Clearance: 58.6 mL/min (A) (by C-G formula based on SCr of 1.27 mg/dL (H)). Liver Function Tests: Recent Labs  Lab 02/15/19 1500 02/16/19 0414 02/19/19 0640 02/20/19 0341 02/21/19 1023  AST 158* 98*  67* 76* 37  ALT 144* 116* 154* 165* 116*  ALKPHOS 141* 123 110 110 95  BILITOT 0.9 0.7 0.8 0.6 0.8  PROT 7.6 7.1 7.2 7.3 7.0  ALBUMIN 3.1* 2.8* 2.8* 2.9* 2.9*   No results for input(s): LIPASE, AMYLASE in the last 168 hours. No results for input(s): AMMONIA in the last 168 hours. Coagulation Profile: No results for input(s): INR, PROTIME in the last 168 hours. Cardiac Enzymes: No results for input(s): CKTOTAL, CKMB, CKMBINDEX, TROPONINI in the last 168 hours. BNP (last 3 results) No results for input(s): PROBNP in the last 8760 hours. HbA1C: No results for input(s): HGBA1C in the last 72 hours. CBG: Recent Labs  Lab 02/21/19 0753 02/21/19 1217 02/21/19 1655 02/21/19 2250 02/22/19 0753  GLUCAP 191* 202* 155* 171* 272*   Lipid Profile: No results for input(s): CHOL, HDL, LDLCALC, TRIG, CHOLHDL, LDLDIRECT in the last 72 hours. Thyroid Function  Tests: No results for input(s): TSH, T4TOTAL, FREET4, T3FREE, THYROIDAB in the last 72 hours. Anemia Panel: Recent Labs    02/20/19 0341  FERRITIN 2,037*   Urine analysis:    Component Value Date/Time   COLORURINE YELLOW 02/08/2019 2258   APPEARANCEUR HAZY (A) 02/08/2019 2258   LABSPEC 1.029 02/08/2019 2258   PHURINE 5.0 02/08/2019 2258   GLUCOSEU >=500 (A) 02/08/2019 2258   GLUCOSEU NEGATIVE 04/06/2010 1554   HGBUR NEGATIVE 02/08/2019 2258   BILIRUBINUR NEGATIVE 02/08/2019 2258   BILIRUBINUR N 03/24/2016 0905   KETONESUR NEGATIVE 02/08/2019 2258   PROTEINUR 100 (A) 02/08/2019 2258   UROBILINOGEN 0.2 03/24/2016 0905   UROBILINOGEN 1.0 04/03/2014 0058   NITRITE NEGATIVE 02/08/2019 2258   LEUKOCYTESUR NEGATIVE 02/08/2019 2258   Sepsis Labs: @LABRCNTIP (procalcitonin:4,lacticidven:4)  ) Recent Results (from the past 240 hour(s))  Blood Culture (routine x 2)     Status: None   Collection Time: 02/15/19  3:00 PM   Specimen: BLOOD  Result Value Ref Range Status   Specimen Description BLOOD LEFT ANTECUBITAL  Final    Special Requests   Final    BOTTLES DRAWN AEROBIC AND ANAEROBIC Blood Culture results may not be optimal due to an inadequate volume of blood received in culture bottles   Culture   Final    NO GROWTH 5 DAYS Performed at [facility], [place] [address] [place], [place] [address]    Report Status 02/20/2019 FINAL  Final  Blood Culture (routine x 2)     Status: None   Collection Time: 02/15/19  4:06 PM   Specimen: BLOOD LEFT HAND  Result Value Ref Range Status   Specimen Description BLOOD LEFT HAND  Final   Special Requests   Final    BOTTLES DRAWN AEROBIC ONLY Blood Culture results may not be optimal due to an inadequate volume of blood received in culture bottles   Culture   Final    NO GROWTH 5 DAYS Performed at [facility], [place] [address], [place], [place] [address]    Report Status 02/20/2019 FINAL  Final      Studies: No results found.  Scheduled Meds: . vitamin C  500 mg Oral Daily  . aspirin EC  81 mg Oral Daily  . cholecalciferol  1,000 Units Oral Daily  . dexamethasone  6 mg Oral Daily  . enoxaparin (LOVENOX) injection  40 mg Subcutaneous Q24H  . famotidine  20 mg Oral BID  . fluticasone  2 spray Each Nare Daily  . furosemide  20 mg Intravenous BID  . influenza vaccine adjuvanted  0.5 mL Intramuscular Tomorrow-1000  . insulin aspart  0-15 Units Subcutaneous TID WC  . insulin aspart  0-5 Units Subcutaneous QHS  . insulin aspart  4 Units Subcutaneous TID WC  . insulin glargine  32 Units Subcutaneous QHS  . mouth rinse  15 mL Mouth Rinse BID  . metoprolol tartrate  12.5 mg Oral BID  . rosuvastatin  20 mg Oral Daily  . zinc sulfate  220 mg Oral Daily    Continuous Infusions:    LOS: 7 days     Kayleen Memos, MD [facility] Pager [phone]  If 7PM-7AM, please contact night-coverage [email or web address] Password [facility] 02/22/2019, 10:43 AM

## 2019-02-22 NOTE — Progress Notes (Signed)
SATURATION QUALIFICATIONS: (This note is used to comply with regulatory documentation for home oxygen)  Patient Saturations on Room Air at Rest =94%   Patient Saturations on Room Air while Ambulating = 88%  Patient Saturations on 0 Liters of oxygen while Ambulating = 88%  Pt sats remained 88-91% on RA with ambulation and  Functional activity.    Nilsa Nutting., OTR/L Acute Rehabilitation Services Pager [phone] Office [phone]

## 2019-02-22 NOTE — Plan of Care (Signed)
  Problem: Clinical Measurements: Goal: Ability to maintain clinical measurements within normal limits will improve Outcome: Progressing Goal: Will remain free from infection Outcome: Progressing Goal: Diagnostic test results will improve Outcome: Progressing   Problem: Education: Goal: Knowledge of risk factors and measures for prevention of condition will improve Outcome: Progressing   Problem: Coping: Goal: Psychosocial and spiritual needs will be supported Outcome: Progressing   Problem: Respiratory: Goal: Will maintain a patent airway Outcome: Progressing Goal: Complications related to the disease process, condition or treatment will be avoided or minimized Outcome: Progressing

## 2019-02-22 NOTE — Progress Notes (Signed)
Occupational Therapy Treatment Patient Details Name: [name] MRN: [id number] DOB: Dec 20, 1945 Today's Date: 02/22/2019    History of present illness 74 yo male seen in ED 12/26 with fever cough and found COVID+ dc home with family. Pt admitted 1/02 due to vomiting diarrhea, c/o CP oxygen saturation RA 84% (hypoxic) .Chest xrays reveal PNA. Pt requiring 3L oxygen 1/2 pt has elevated LFTs 2/2 COVID+  PMH: CABGx4, CAD, DM2, HTN Aortic valve replacement with tissue valve CHF AFIB   OT comments  Pt is making excellent progress toward goals.  He requires supervision for ADLs, but does demonstrate decreased activity tolerance.  Reviewed energy conservation techniques and encouraged use of flutter valve.  02 sats decreased to 88% on RA with activity, but quickly rebounded into low 90s with rest break.    Follow Up Recommendations  Home health OT;Supervision/Assistance - 24 hour    Equipment Recommendations  None recommended by OT    Recommendations for Other Services      Precautions / Restrictions Precautions Precautions: Fall       Mobility Bed Mobility Overal bed mobility: Needs Assistance Bed Mobility: Supine to Sit     Supine to sit: Supervision        Transfers Overall transfer level: Needs assistance Equipment used: None Transfers: Sit to/from [employer] Transfers Sit to Stand: Supervision   Squat pivot transfers: Supervision     General transfer comment: Pt moves slowly and deliberately     Balance Overall balance assessment: Mild deficits observed, not formally tested                                         ADL either performed or assessed with clinical judgement   ADL Overall ADL's : Needs assistance/impaired Eating/Feeding: Independent;Sitting   Grooming: Wash/dry face;Wash/dry hands;Oral care;Brushing hair;Supervision/safety;Standing   Upper Body Bathing: Set up;Sitting   Lower Body Bathing: Supervison/ safety   Upper Body  Dressing : Supervision/safety;Sitting   Lower Body Dressing: Supervision/safety;Sit to/from stand   Toilet Transfer: Supervision/safety;Ambulation;Comfort height toilet   Toileting- Clothing Manipulation and Hygiene: Engineer, site Details (indicate cue type and reason): Pt reports he has a shower seat - encouraged him to sit to shower    General ADL Comments: reviewed energy conservation strategies      Vision       Perception     Praxis      Cognition Arousal/Alertness: Awake/alert Behavior During Therapy: WFL for tasks assessed/performed Overall Cognitive Status: Within Functional Limits for tasks assessed                                          Exercises     Shoulder Instructions       General Comments 02 sats decreased to 88% on RA, but quickly rebounded to low 90s with rest break.  Encouraged use of flutter valve     Pertinent Vitals/ Pain       Pain Assessment: No/denies pain  Home Living                                          Prior Functioning/Environment  Frequency  Min 2X/week        Progress Toward Goals  OT Goals(current goals can now be found in the care plan section)  Progress towards OT goals: Progressing toward goals     Plan Discharge plan remains appropriate    Co-evaluation                 AM-PAC OT "6 Clicks" Daily Activity     Outcome Measure   Help from another person eating meals?: None Help from another person taking care of personal grooming?: A Little Help from another person toileting, which includes using toliet, bedpan, or urinal?: A Little Help from another person bathing (including washing, rinsing, drying)?: A Little Help from another person to put on and taking off regular upper body clothing?: A Little Help from another person to put on and taking off regular lower body clothing?: A Little 6 Click Score: 19    End of  Session    OT Visit Diagnosis: Unsteadiness on feet (R26.81);Other (comment)   Activity Tolerance Patient tolerated treatment well   Patient Left in chair;with call bell/phone within reach;with chair alarm set   Nurse Communication Mobility status        Time: [id number] OT Time Calculation (min): 18 min  Charges: OT General Charges $OT Visit: 1 Visit OT Treatments $Therapeutic Activity: 8-22 mins  Nilsa Nutting., OTR/L Acute Rehabilitation Services Pager [phone] Office [phone]    Lucille Passy M 02/22/2019, 6:00 PM

## 2019-02-22 NOTE — Progress Notes (Signed)
   02/22/19 1935  MEWS Score  Resp 18  ECG Heart Rate 71  Pulse Rate 70  SpO2 96 %  MEWS Score  MEWS RR 0  MEWS Pulse 0  MEWS Systolic 0  MEWS LOC 0  MEWS Temp 0  MEWS Score 0  MEWS Score Color Green  Provider Notification  Provider Name/Title Bodenheimer, NP  Date Provider Notified 02/22/19  Time Provider Notified [phone]  Notification Type Page  Notification Reason Other (Comment) (Per tele, patient had 3 beats V-tach,NSR,then 7 beats V-tach)  Response No new orders   Will continue to monitor.

## 2019-02-23 LAB — GLUCOSE, CAPILLARY
Glucose-Capillary: 91 mg/dL (ref 70–99)
Glucose-Capillary: 130 mg/dL — ABNORMAL HIGH (ref 70–99)

## 2019-02-23 MED ORDER — DEXAMETHASONE 6 MG PO TABS: 6.0000 mg | ORAL_TABLET | Freq: Every day | ORAL | 0 refills | Status: AC

## 2019-02-23 MED ORDER — FAMOTIDINE 20 MG PO TABS: 20.0000 mg | ORAL_TABLET | Freq: Two times a day (BID) | ORAL | 0 refills | Status: DC

## 2019-02-23 MED ORDER — VITAMIN D3 25 MCG PO TABS: 1000.0000 [IU] | ORAL_TABLET | Freq: Every day | ORAL | 0 refills | Status: DC

## 2019-02-23 MED ORDER — ASCORBIC ACID 500 MG PO TABS: 500.0000 mg | ORAL_TABLET | Freq: Every day | ORAL | 0 refills | Status: DC

## 2019-02-23 MED ORDER — ZINC SULFATE 220 (50 ZN) MG PO CAPS: 220.0000 mg | ORAL_CAPSULE | Freq: Every day | ORAL | 0 refills | Status: DC

## 2019-02-23 MED ORDER — FLUTICASONE PROPIONATE 50 MCG/ACT NA SUSP: 2.0000 | Freq: Every day | NASAL | 0 refills | Status: DC

## 2019-02-23 MED ORDER — METOPROLOL TARTRATE 25 MG PO TABS: 12.5000 mg | ORAL_TABLET | Freq: Two times a day (BID) | ORAL | 0 refills | Status: DC

## 2019-02-23 MED ORDER — ALBUTEROL SULFATE HFA 108 (90 BASE) MCG/ACT IN AERS: 1.0000 | INHALATION_SPRAY | Freq: Two times a day (BID) | RESPIRATORY_TRACT | 0 refills | Status: DC | PRN

## 2019-02-23 NOTE — Progress Notes (Signed)
Pt ambulated in hallway without )2 statring SpO2 on Room air  96% Ambulating SpO2 87% to 92 % Sitting SpO2 on room air at rest 94%

## 2019-02-23 NOTE — Discharge Instructions (Signed)
10 Things You Can Do to Manage Your COVID-19 Symptoms at Home If you have possible or confirmed COVID-19: 1. Stay home from work and school. And stay away from other public places. If you must go out, avoid using any kind of public transportation, ridesharing, or taxis. 2. Monitor your symptoms carefully. If your symptoms get worse, call your healthcare provider immediately. 3. Get rest and stay hydrated. 4. If you have a medical appointment, call the healthcare provider ahead of time and tell them that you have or may have COVID-19. 5. For medical emergencies, call 911 and notify the dispatch personnel that you have or may have COVID-19. 6. Cover your cough and sneezes with a tissue or use the inside of your elbow. 7. Wash your hands often with soap and water for at least 20 seconds or clean your hands with an alcohol-based hand sanitizer that contains at least 60% alcohol. 8. As much as possible, stay in a specific room and away from other people in your home. Also, you should use a separate bathroom, if available. If you need to be around other people in or outside of the home, wear a mask. 9. Avoid sharing personal items with other people in your household, like dishes, towels, and bedding. 10. Clean all surfaces that are touched often, like counters, tabletops, and doorknobs. Use household cleaning sprays or wipes according to the label instructions. cdc.gov/coronavirus 08/14/2018 This information is not intended to replace advice given to you by your health care provider. Make sure you discuss any questions you have with your health care provider. Document Revised: 01/16/2019 Document Reviewed: 01/16/2019 Elsevier Patient Education  2020 [employer]   COVID-19 Frequently Asked Questions COVID-19 (coronavirus disease) is an infection that is caused by a large family of viruses. Some viruses cause illness in people and others cause illness in animals like camels, cats, and bats. In some  cases, the viruses that cause illness in animals can spread to humans. Where did the coronavirus come from? In December 2019, [place] told the World Health Organization (WHO) of several cases of lung disease (human respiratory illness). These cases were linked to an open seafood and livestock market in the city of [place]. The link to the seafood and livestock market suggests that the virus may have spread from animals to humans. However, since that first outbreak in December, the virus has also been shown to spread from person to person. What is the name of the disease and the virus? Disease name Early on, this disease was called novel coronavirus. This is because scientists determined that the disease was caused by a new (novel) respiratory virus. The World Health Organization (WHO) has now named the disease COVID-19, or coronavirus disease. Virus name The virus that causes the disease is called severe acute respiratory syndrome coronavirus 2 (SARS-CoV-2). More information on disease and virus naming World Health Organization (WHO): www.who.int/emergencies/diseases/novel-coronavirus-2019/technical-guidance/naming-the-coronavirus-disease-(covid-2019)-and-the-virus-that-causes-it Who is at risk for complications from coronavirus disease? Some people may be at higher risk for complications from coronavirus disease. This includes older adults and people who have chronic diseases, such as heart disease, diabetes, and lung disease. If you are at higher risk for complications, take these extra precautions:  Stay home as much as possible.  Avoid social gatherings and travel.  Avoid close contact with others. Stay at least 6 ft (2 m) away from others, if possible.  Wash your hands often with soap and water for at least 20 seconds.  Avoid touching your face, mouth, nose, or eyes.    Keep supplies on hand at home, such as food, medicine, and cleaning supplies.  If you must go out in public, wear a cloth  face covering or face mask. Make sure your mask covers your nose and mouth. How does coronavirus disease spread? The virus that causes coronavirus disease spreads easily from person to person (is contagious). You may catch the virus by:  Breathing in droplets from an infected person. Droplets can be spread by a person breathing, speaking, singing, coughing, or sneezing.  Touching something, like a table or a doorknob, that was exposed to the virus (contaminated) and then touching your mouth, nose, or eyes. Can I get the virus from touching surfaces or objects? There is still a lot that we do not know about the virus that causes coronavirus disease. Scientists are basing a lot of information on what they know about similar viruses, such as:  Viruses cannot generally survive on surfaces for long. They need a human body (host) to survive.  It is more likely that the virus is spread by close contact with people who are sick (direct contact), such as through: ? Shaking hands or hugging. ? Breathing in respiratory droplets that travel through the air. Droplets can be spread by a person breathing, speaking, singing, coughing, or sneezing.  It is less likely that the virus is spread when a person touches a surface or object that has the virus on it (indirect contact). The virus may be able to enter the body if the person touches a surface or object and then touches his or her face, eyes, nose, or mouth. Can a person spread the virus without having symptoms of the disease? It may be possible for the virus to spread before a person has symptoms of the disease, but this is most likely not the main way the virus is spreading. It is more likely for the virus to spread by being in close contact with people who are sick and breathing in the respiratory droplets spread by a person breathing, speaking, singing, coughing, or sneezing. What are the symptoms of coronavirus disease? Symptoms vary from person to  person and can range from mild to severe. Symptoms may include:  Fever or chills.  Cough.  Difficulty breathing or feeling short of breath.  Headaches, body aches, or muscle aches.  Runny or stuffy (congested) nose.  Sore throat.  New loss of taste or smell.  Nausea, vomiting, or diarrhea. These symptoms can appear anywhere from 2 to 14 days after you have been exposed to the virus. Some people may not have any symptoms. If you develop symptoms, call your health care provider. People with severe symptoms may need hospital care. Should I be tested for this virus? Your health care provider will decide whether to test you based on your symptoms, history of exposure, and your risk factors. How does a health care provider test for this virus? Health care providers will collect samples to send for testing. Samples may include:  Taking a swab of fluid from the back of your nose and throat, your nose, or your throat.  Taking fluid from the lungs by having you cough up mucus (sputum) into a sterile cup.  Taking a blood sample. Is there a treatment or vaccine for this virus? Currently, there is no vaccine to prevent coronavirus disease. Also, there are no medicines like antibiotics or antivirals to treat the virus. A person who becomes sick is given supportive care, which means rest and fluids. A person may also   relieve his or her symptoms by using over-the-counter medicines that treat sneezing, coughing, and runny nose. These are the same medicines that a person takes for the common cold. If you develop symptoms, call your health care provider. People with severe symptoms may need hospital care. What can I do to protect myself and my family from this virus?     You can protect yourself and your family by taking the same actions that you would take to prevent the spread of other viruses. Take the following actions:  Wash your hands often with soap and water for at least 20 seconds. If soap  and water are not available, use alcohol-based hand sanitizer.  Avoid touching your face, mouth, nose, or eyes.  Cough or sneeze into a tissue, sleeve, or elbow. Do not cough or sneeze into your hand or the air. ? If you cough or sneeze into a tissue, throw it away immediately and wash your hands.  Disinfect objects and surfaces that you frequently touch every day.  Stay away from people who are sick.  Avoid going out in public, follow guidance from your state and local health authorities.  Avoid crowded indoor spaces. Stay at least 6 ft (2 m) away from others.  If you must go out in public, wear a cloth face covering or face mask. Make sure your mask covers your nose and mouth.  Stay home if you are sick, except to get medical care. Call your health care provider before you get medical care. Your health care provider will tell you how long to stay home.  Make sure your vaccines are up to date. Ask your health care provider what vaccines you need. What should I do if I need to travel? Follow travel recommendations from your local health authority, the CDC, and WHO. Travel information and advice  Centers for Disease Control and Prevention (CDC): www.cdc.gov/coronavirus/2019-ncov/travelers/index.html  World Health Organization (WHO): www.who.int/emergencies/diseases/novel-coronavirus-2019/travel-advice Know the risks and take action to protect your health  You are at higher risk of getting coronavirus disease if you are traveling to areas with an outbreak or if you are exposed to travelers from areas with an outbreak.  Wash your hands often and practice good hygiene to lower the risk of catching or spreading the virus. What should I do if I am sick? General instructions to stop the spread of infection  Wash your hands often with soap and water for at least 20 seconds. If soap and water are not available, use alcohol-based hand sanitizer.  Cough or sneeze into a tissue, sleeve, or  elbow. Do not cough or sneeze into your hand or the air.  If you cough or sneeze into a tissue, throw it away immediately and wash your hands.  Stay home unless you must get medical care. Call your health care provider or local health authority before you get medical care.  Avoid public areas. Do not take public transportation, if possible.  If you can, wear a mask if you must go out of the house or if you are in close contact with someone who is not sick. Make sure your mask covers your nose and mouth. Keep your home clean  Disinfect objects and surfaces that are frequently touched every day. This may include: ? Counters and tables. ? Doorknobs and light switches. ? Sinks and faucets. ? Electronics such as phones, remote controls, keyboards, computers, and tablets.  Wash dishes in hot, soapy water or use a dishwasher. Air-dry your dishes.  Wash laundry in   hot water. Prevent infecting other household members  Let healthy household members care for children and pets, if possible. If you have to care for children or pets, wash your hands often and wear a mask.  Sleep in a different bedroom or bed, if possible.  Do not share personal items, such as razors, toothbrushes, deodorant, combs, brushes, towels, and washcloths. Where to find more information Centers for Disease Control and Prevention (CDC)  Information and news updates: https://www.butler-gonzalez.com/ World Health Organization [facility])  Information and news updates: MissExecutive.com.ee  Coronavirus health topic: https://www.castaneda.info/  Questions and answers on COVID-19: OpportunityDebt.at  Global tracker: who.sprinklr.com American Academy of Pediatrics (AAP)  Information for families: www.healthychildren.org/English/health-issues/conditions/chest-lungs/Pages/2019-Novel-Coronavirus.aspx The coronavirus situation is changing rapidly. Check  your local health authority website or the CDC and [facility] websites for updates and news. When should I contact a health care provider?  Contact your health care provider if you have symptoms of an infection, such as fever or cough, and you: ? Have been near anyone who is known to have coronavirus disease. ? Have come into contact with a person who is suspected to have coronavirus disease. ? Have traveled to an area where there is an outbreak of COVID-19. When should I get emergency medical care?  Get help right away by calling your local emergency services (911 in the [place] if you have: ? Trouble breathing. ? Pain or pressure in your chest. ? Confusion. ? Blue-tinged lips and fingernails. ? Difficulty waking from sleep. ? Symptoms that get worse. Let the emergency medical personnel know if you think you have coronavirus disease. Summary  A new respiratory virus is spreading from person to person and causing COVID-19 (coronavirus disease).  The virus that causes COVID-19 appears to spread easily. It spreads from one person to another through droplets from breathing, speaking, singing, coughing, or sneezing.  Older adults and those with chronic diseases are at higher risk of disease. If you are at higher risk for complications, take extra precautions.  There is currently no vaccine to prevent coronavirus disease. There are no medicines, such as antibiotics or antivirals, to treat the virus.  You can protect yourself and your family by washing your hands often, avoiding touching your face, and covering your coughs and sneezes. This information is not intended to replace advice given to you by your health care provider. Make sure you discuss any questions you have with your health care provider. Document Revised: 11/29/2018 Document Reviewed: 05/28/2018 Elsevier Patient Education  2020 [place].   COVID-19 COVID-19 is a respiratory infection that is caused by a virus called severe  acute respiratory syndrome coronavirus 2 (SARS-CoV-2). The disease is also known as coronavirus disease or novel coronavirus. In some people, the virus may not cause any symptoms. In others, it may cause a serious infection. The infection can get worse quickly and can lead to complications, such as:  Pneumonia, or infection of the lungs.  Acute respiratory distress syndrome or ARDS. This is a condition in which fluid build-up in the lungs prevents the lungs from filling with air and passing oxygen into the blood.  Acute respiratory failure. This is a condition in which there is not enough oxygen passing from the lungs to the body or when carbon dioxide is not passing from the lungs out of the body.  Sepsis or septic shock. This is a serious bodily reaction to an infection.  Blood clotting problems.  Secondary infections due to bacteria or fungus.  Organ failure. This is when your  body's organs stop working. The virus that causes COVID-19 is contagious. This means that it can spread from person to person through droplets from coughs and sneezes (respiratory secretions). What are the causes? This illness is caused by a virus. You may catch the virus by:  Breathing in droplets from an infected person. Droplets can be spread by a person breathing, speaking, singing, coughing, or sneezing.  Touching something, like a table or a doorknob, that was exposed to the virus (contaminated) and then touching your mouth, nose, or eyes. What increases the risk? Risk for infection You are more likely to be infected with this virus if you:  Are within 6 feet (2 meters) of a person with COVID-19.  Provide care for or live with a person who is infected with COVID-19.  Spend time in crowded indoor spaces or live in shared housing. Risk for serious illness You are more likely to become seriously ill from the virus if you:  Are 75 years of age or older. The higher your age, the more you are at risk for  serious illness.  Live in a nursing home or long-term care facility.  Have cancer.  Have a long-term (chronic) disease such as: ? Chronic lung disease, including chronic obstructive pulmonary disease or asthma. ? A long-term disease that lowers your body's ability to fight infection (immunocompromised). ? Heart disease, including heart failure, a condition in which the arteries that lead to the heart become narrow or blocked (coronary artery disease), a disease which makes the heart muscle thick, weak, or stiff (cardiomyopathy). ? Diabetes. ? Chronic kidney disease. ? Sickle cell disease, a condition in which red blood cells have an abnormal "sickle" shape. ? Liver disease.  Are obese. What are the signs or symptoms? Symptoms of this condition can range from mild to severe. Symptoms may appear any time from 2 to 14 days after being exposed to the virus. They include:  A fever or chills.  A cough.  Difficulty breathing.  Headaches, body aches, or muscle aches.  Runny or stuffy (congested) nose.  A sore throat.  New loss of taste or smell. Some people may also have stomach problems, such as nausea, vomiting, or diarrhea. Other people may not have any symptoms of COVID-19. How is this diagnosed? This condition may be diagnosed based on:  Your signs and symptoms, especially if: ? You live in an area with a COVID-19 outbreak. ? You recently traveled to or from an area where the virus is common. ? You provide care for or live with a person who was diagnosed with COVID-19. ? You were exposed to a person who was diagnosed with COVID-19.  A physical exam.  Lab tests, which may include: ? Taking a sample of fluid from the back of your nose and throat (nasopharyngeal fluid), your nose, or your throat using a swab. ? A sample of mucus from your lungs (sputum). ? Blood tests.  Imaging tests, which may include, X-rays, CT scan, or ultrasound. How is this treated? At present,  there is no medicine to treat COVID-19. Medicines that treat other diseases are being used on a trial basis to see if they are effective against COVID-19. Your health care provider will talk with you about ways to treat your symptoms. For most people, the infection is mild and can be managed at home with rest, fluids, and over-the-counter medicines. Treatment for a serious infection usually takes places in a hospital intensive care unit (ICU). It may include one or  more of the following treatments. These treatments are given until your symptoms improve.  Receiving fluids and medicines through an IV.  Supplemental oxygen. Extra oxygen is given through a tube in the nose, a face mask, or a hood.  Positioning you to lie on your stomach (prone position). This makes it easier for oxygen to get into the lungs.  Continuous positive airway pressure (CPAP) or bi-level positive airway pressure (BPAP) machine. This treatment uses mild air pressure to keep the airways open. A tube that is connected to a motor delivers oxygen to the body.  Ventilator. This treatment moves air into and out of the lungs by using a tube that is placed in your windpipe.  Tracheostomy. This is a procedure to create a hole in the neck so that a breathing tube can be inserted.  Extracorporeal membrane oxygenation (ECMO). This procedure gives the lungs a chance to recover by taking over the functions of the heart and lungs. It supplies oxygen to the body and removes carbon dioxide. Follow these instructions at home: Lifestyle  If you are sick, stay home except to get medical care. Your health care provider will tell you how long to stay home. Call your health care provider before you go for medical care.  Rest at home as told by your health care provider.  Do not use any products that contain nicotine or tobacco, such as cigarettes, e-cigarettes, and chewing tobacco. If you need help quitting, ask your health care  provider.  Return to your normal activities as told by your health care provider. Ask your health care provider what activities are safe for you. General instructions  Take over-the-counter and prescription medicines only as told by your health care provider.  Drink enough fluid to keep your urine pale yellow.  Keep all follow-up visits as told by your health care provider. This is important. How is this prevented?  There is no vaccine to help prevent COVID-19 infection. However, there are steps you can take to protect yourself and others from this virus. To protect yourself:   Do not travel to areas where COVID-19 is a risk. The areas where COVID-19 is reported change often. To identify high-risk areas and travel restrictions, check the CDC travel website: FatFares.com.br  If you live in, or must travel to, an area where COVID-19 is a risk, take precautions to avoid infection. ? Stay away from people who are sick. ? Wash your hands often with soap and water for 20 seconds. If soap and water are not available, use an alcohol-based hand sanitizer. ? Avoid touching your mouth, face, eyes, or nose. ? Avoid going out in public, follow guidance from your state and local health authorities. ? If you must go out in public, wear a cloth face covering or face mask. Make sure your mask covers your nose and mouth. ? Avoid crowded indoor spaces. Stay at least 6 feet (2 meters) away from others. ? Disinfect objects and surfaces that are frequently touched every day. This may include:  Counters and tables.  Doorknobs and light switches.  Sinks and faucets.  Electronics, such as phones, remote controls, keyboards, computers, and tablets. To protect others: If you have symptoms of COVID-19, take steps to prevent the virus from spreading to others.  If you think you have a COVID-19 infection, contact your health care provider right away. Tell your health care team that you think you  may have a COVID-19 infection.  Stay home. Leave your house only to seek  medical care. Do not use public transport.  Do not travel while you are sick.  Wash your hands often with soap and water for 20 seconds. If soap and water are not available, use alcohol-based hand sanitizer.  Stay away from other members of your household. Let healthy household members care for children and pets, if possible. If you have to care for children or pets, wash your hands often and wear a mask. If possible, stay in your own room, separate from others. Use a different bathroom.  Make sure that all people in your household wash their hands well and often.  Cough or sneeze into a tissue or your sleeve or elbow. Do not cough or sneeze into your hand or into the air.  Wear a cloth face covering or face mask. Make sure your mask covers your nose and mouth. Where to find more information  Centers for Disease Control and Prevention: PurpleGadgets.be  World Health Organization: https://www.castaneda.info/ Contact a health care provider if:  You live in or have traveled to an area where COVID-19 is a risk and you have symptoms of the infection.  You have had contact with someone who has COVID-19 and you have symptoms of the infection. Get help right away if:  You have trouble breathing.  You have pain or pressure in your chest.  You have confusion.  You have bluish lips and fingernails.  You have difficulty waking from sleep.  You have symptoms that get worse. These symptoms may represent a serious problem that is an emergency. Do not wait to see if the symptoms will go away. Get medical help right away. Call your local emergency services (911 in the [place] Do not drive yourself to the hospital. Let the emergency medical personnel know if you think you have COVID-19. Summary  COVID-19 is a respiratory infection that is caused by a virus. It is also known as  coronavirus disease or novel coronavirus. It can cause serious infections, such as pneumonia, acute respiratory distress syndrome, acute respiratory failure, or sepsis.  The virus that causes COVID-19 is contagious. This means that it can spread from person to person through droplets from breathing, speaking, singing, coughing, or sneezing.  You are more likely to develop a serious illness if you are 31 years of age or older, have a weak immune system, live in a nursing home, or have chronic disease.  There is no medicine to treat COVID-19. Your health care provider will talk with you about ways to treat your symptoms.  Take steps to protect yourself and others from infection. Wash your hands often and disinfect objects and surfaces that are frequently touched every day. Stay away from people who are sick and wear a mask if you are sick. This information is not intended to replace advice given to you by your health care provider. Make sure you discuss any questions you have with your health care provider. Document Revised: 11/29/2018 Document Reviewed: 03/07/2018 Elsevier Patient Education  2020 [place].  COVID-19: How to Protect Yourself and Others Know how it spreads  There is currently no vaccine to prevent coronavirus disease 2019 (COVID-19).  The best way to prevent illness is to avoid being exposed to this virus.  The virus is thought to spread mainly from person-to-person. ? Between people who are in close contact with one another (within about 6 feet). ? Through respiratory droplets produced when an infected person coughs, sneezes or talks. ? These droplets can land in the mouths or noses  of people who are nearby or possibly be inhaled into the lungs. ? COVID-19 may be spread by people who are not showing symptoms. Everyone should Clean your hands often  Wash your hands often with soap and water for at least 20 seconds especially after you have been in a public place, or  after blowing your nose, coughing, or sneezing.  If soap and water are not readily available, use a hand sanitizer that contains at least 60% alcohol. Cover all surfaces of your hands and rub them together until they feel dry.  Avoid touching your eyes, nose, and mouth with unwashed hands. Avoid close contact  Limit contact with others as much as possible.  Avoid close contact with people who are sick.  Put distance between yourself and other people. ? Remember that some people without symptoms may be able to spread virus. ? This is especially important for people who are at higher risk of getting very [email or web address] Cover your mouth and nose with a mask when around others  You could spread COVID-19 to others even if you do not feel sick.  Everyone should wear a mask in public settings and when around people not living in their household, especially when social distancing is difficult to maintain. ? Masks should not be placed on young children under age 36, anyone who has trouble breathing, or is unconscious, incapacitated or otherwise unable to remove the mask without assistance.  The mask is meant to protect other people in case you are infected.  Do NOT use a facemask meant for a Dietitian.  Continue to keep about 6 feet between yourself and others. The mask is not a substitute for social distancing. Cover coughs and sneezes  Always cover your mouth and nose with a tissue when you cough or sneeze or use the inside of your elbow.  Throw used tissues in the trash.  Immediately wash your hands with soap and water for at least 20 seconds. If soap and water are not readily available, clean your hands with a hand sanitizer that contains at least 60% alcohol. Clean and disinfect  Clean AND disinfect frequently touched surfaces daily. This includes tables, doorknobs, light switches, countertops, handles,  desks, phones, keyboards, toilets, faucets, and sinks. [email or web address]  If surfaces are dirty, clean them: Use detergent or soap and water prior to disinfection.  Then, use a household disinfectant. You can see a list of EPA-registered household disinfectants here. [email or web address] 10/16/2018 This information is not intended to replace advice given to you by your health care provider. Make sure you discuss any questions you have with your health care provider. Document Revised: 10/24/2018 Document Reviewed: 08/22/2018 Elsevier Patient Education  [facility] Under Monitoring Name: [name]  Location: [address] [place] [address]   CORONAVIRUS DISEASE 2019 (COVID-19) Guidance for Persons Under Investigation You are being tested for the virus that causes coronavirus disease 2019 (COVID-19). Public health actions are necessary to ensure protection of your health and the health of others, and to prevent further spread of infection. COVID-19 is caused by a virus that can cause symptoms, such as fever, cough, and shortness of breath. The primary transmission from person to person is by coughing or sneezing. On March 14, 2018, the [place] announced a [employer] Emergency of International Concern and on March 15, 2018 the U.[place]. Department of Health and Human Services declared a public health emergency. If the virus that causesCOVID-19 spreads in the  community, it could have severe public health consequences.  As a person under investigation for COVID-19, the [place] advises you to adhere to the following guidance until your test results are reported to you. If your test result is positive, you will receive additional information from your provider and your local health department at that  time.   Remain at home until you are cleared by your health provider or public health authorities.   Keep a log of visitors to your home using the form provided. Any visitors to your home must be aware of your isolation status.  If you plan to move to a new address or leave the county, notify the local health department in your county.  Call a doctor or seek care if you have an urgent medical need. Before seeking medical care, call ahead and get instructions from the provider before arriving at the medical office, clinic or hospital. Notify them that you are being tested for the virus that causes COVID-19 so arrangements can be made, as necessary, to prevent transmission to others in the healthcare setting. Next, notify the local health department in your county.  If a medical emergency arises and you need to call 911, inform the first responders that you are being tested for the virus that causes COVID-19. Next, notify the local health department in your county.  Adhere to all guidance set forth by the [place] for [facility] of patients that is based on guidance from the Center for Disease Control and Prevention with suspected or confirmed COVID-19. It is provided with this guidance for Persons Under Investigation.  Your health and the health of our community are our top priorities. Public Health officials remain available to provide assistance and counseling to you about COVID-19 and compliance with this guidance.  Provider: ____________________________________________________________ Date: ______/_____/_________  By signing below, you acknowledge that you have read and agree to comply with this Guidance for Persons Under Investigation. ______________________________________________________________ Date: ______/_____/_________  WHO DO I CALL? You can find a list of local health departments here:  [email or web address] Health Department: ____________________________________________________________________ Contact Name: ________________________________________________________________________ Telephone: ___________________________________________________________________________  Marice Potter, [place], Communicable Disease Branch COVID-19 Guidance for Persons Under Investigation April 20, 2018   Person Under Monitoring Name: [name]  Location: [place] [address]   Infection Prevention Recommendations for Individuals Confirmed to have, or Being Evaluated for, 2019 Novel Coronavirus (COVID-19) Infection Who Receive Care at Home  Individuals who are confirmed to have, or are being evaluated for, COVID-19 should follow the prevention steps below until a healthcare provider or local or state health department says they can return to normal activities.  Stay home except to get medical care You should restrict activities outside your home, except for getting medical care. Do not go to work, school, or public areas, and do not use public transportation or taxis.  Call ahead before visiting your doctor Before your medical appointment, call the healthcare provider and tell them that you have, or are being evaluated for, COVID-19 infection. This will help the healthcare providers office take steps to keep other people from getting infected. Ask your healthcare provider to call the local or state health department.  Monitor your symptoms Seek prompt medical attention if your illness is worsening (e.g., difficulty breathing). Before going to your medical appointment, call the healthcare provider and tell them that you have, or  are being evaluated for, COVID-19 infection. Ask your healthcare provider to call the local or state health department.  Wear a facemask You should wear a  facemask that covers your nose and mouth when you are in the same room with other people and when you visit a healthcare provider. People who live with or visit you should also wear a facemask while they are in the same room with you.  Separate yourself from other people in your home As much as possible, you should stay in a different room from other people in your home. Also, you should use a separate bathroom, if available.  Avoid sharing household items You should not share dishes, drinking glasses, cups, eating utensils, towels, bedding, or other items with other people in your home. After using these items, you should wash them thoroughly with soap and water.  Cover your coughs and sneezes Cover your mouth and nose with a tissue when you cough or sneeze, or you can cough or sneeze into your sleeve. Throw used tissues in a lined trash can, and immediately wash your hands with soap and water for at least 20 seconds or use an alcohol-based hand rub.  Wash your [facility] your hands often and thoroughly with soap and water for at least 20 seconds. You can use an alcohol-based hand sanitizer if soap and water are not available and if your hands are not visibly dirty. Avoid touching your eyes, nose, and mouth with unwashed hands.   Prevention Steps for Caregivers and Household Members of Individuals Confirmed to have, or Being Evaluated for, COVID-19 Infection Being Cared for in the Home  If you live with, or provide care at home for, a person confirmed to have, or being evaluated for, COVID-19 infection please follow these guidelines to prevent infection:  Follow healthcare providers instructions Make sure that you understand and can help the patient follow any healthcare provider instructions for all care.  Provide for the patients basic needs You should help the patient with basic needs in the home and provide support for getting groceries, prescriptions, and other personal  needs.  Monitor the patients symptoms If they are getting sicker, call his or her medical provider and tell them that the patient has, or is being evaluated for, COVID-19 infection. This will help the healthcare providers office take steps to keep other people from getting infected. Ask the healthcare provider to call the local or state health department.  Limit the number of people who have contact with the patient  If possible, have only one caregiver for the patient.  Other household members should stay in another home or place of residence. If this is not possible, they should stay  in another room, or be separated from the patient as much as possible. Use a separate bathroom, if available.  Restrict visitors who do not have an essential need to be in the home.  Keep older adults, very young children, and other sick people away from the patient Keep older adults, very young children, and those who have compromised immune systems or chronic health conditions away from the patient. This includes people with chronic heart, lung, or kidney conditions, diabetes, and cancer.  Ensure good ventilation Make sure that shared spaces in the home have good air flow, such as from an air conditioner or an opened window, weather permitting.  Wash your hands often  Wash your hands often and thoroughly with soap and water for at least 20 seconds. You can use an alcohol  based hand sanitizer if soap and water are not available and if your hands are not visibly dirty.  Avoid touching your eyes, nose, and mouth with unwashed hands.  Use disposable paper towels to dry your hands. If not available, use dedicated cloth towels and replace them when they become wet.  Wear a facemask and gloves  Wear a disposable facemask at all times in the room and gloves when you touch or have contact with the patients blood, body fluids, and/or secretions or excretions, such as sweat, saliva, sputum, nasal mucus,  vomit, urine, or feces.  Ensure the mask fits over your nose and mouth tightly, and do not touch it during use.  Throw out disposable facemasks and gloves after using them. Do not reuse.  Wash your hands immediately after removing your facemask and gloves.  If your personal clothing becomes contaminated, carefully remove clothing and launder. Wash your hands after handling contaminated clothing.  Place all used disposable facemasks, gloves, and other waste in a lined container before disposing them with other household waste.  Remove gloves and wash your hands immediately after handling these items.  Do not share dishes, glasses, or other household items with the patient  Avoid sharing household items. You should not share dishes, drinking glasses, cups, eating utensils, towels, bedding, or other items with a patient who is confirmed to have, or being evaluated for, COVID-19 infection.  After the person uses these items, you should wash them thoroughly with soap and water.  Wash laundry thoroughly  Immediately remove and wash clothes or bedding that have blood, body fluids, and/or secretions or excretions, such as sweat, saliva, sputum, nasal mucus, vomit, urine, or feces, on them.  Wear gloves when handling laundry from the patient.  Read and follow directions on labels of laundry or clothing items and detergent. In general, wash and dry with the warmest temperatures recommended on the label.  Clean all areas the individual has used often  Clean all touchable surfaces, such as counters, tabletops, doorknobs, bathroom fixtures, toilets, phones, keyboards, tablets, and bedside tables, every day. Also, clean any surfaces that may have blood, body fluids, and/or secretions or excretions on them.  Wear gloves when cleaning surfaces the patient has come in contact with.  Use a diluted bleach solution (e.g., dilute bleach with 1 part bleach and 10 parts water) or a household disinfectant  with a label that says EPA-registered for coronaviruses. To make a bleach solution at home, add 1 tablespoon of bleach to 1 quart (4 cups) of water. For a larger supply, add  cup of bleach to 1 gallon (16 cups) of water.  Read labels of cleaning products and follow recommendations provided on product labels. Labels contain instructions for safe and effective use of the cleaning product including precautions you should take when applying the product, such as wearing gloves or eye protection and making sure you have good ventilation during use of the product.  Remove gloves and wash hands immediately after cleaning.  Monitor yourself for signs and symptoms of illness Caregivers and household members are considered close contacts, should monitor their health, and will be asked to limit movement outside of the home to the extent possible. Follow the monitoring steps for close contacts listed on the symptom monitoring form.   ? If you have additional questions, contact your local health department or call the epidemiologist on call at [phone] (available 24/7). ? This guidance is subject to change. For the most up-to-date guidance from [facility] ([place]), please refer  to their website: [email or web address]

## 2019-02-23 NOTE — Progress Notes (Signed)
Updated the patient's daughter Yetta Flock.  She reported she also tested positive for covid-19.  She lives with her father.  Informed her that he is being discharged today.  All questions answered.

## 2019-02-23 NOTE — Discharge Summary (Signed)
Discharge Summary  [name] DOB: 23-Oct-1945  PCP: [name], NP  Admit date: 02/15/2019 Discharge date: 02/23/2019  Time spent: 35 minutes   Recommendations for Outpatient Follow-up:  1. Follow-up with your primary care provider 2. Follow-up with cardiology 3. Take your medications as prescribed 4. Quarantine for 21 days from positive COVID-19 test on 02/09/19 through March 02, 2019.  Discharge Diagnoses:  Active Hospital Problems   Diagnosis Date Noted  . Pneumonia due to COVID-19 virus 02/15/2019    Resolved Hospital Problems  No resolved problems to display.    Discharge Condition: Stable  Diet recommendation: Heart healthy carb modified diet.  Vitals:   02/23/19 0400 02/23/19 0553  BP:  123/75  Pulse: (!) 58 66  Resp: 17 11  Temp:  98.1 F (36.7 C)  SpO2: 90% 93%    History of present illness:  [name] a 74 y.o.malewith medical history significant ofaortic valve replacement with a tissue valve, CAD status post CABG x4 as, diabetes mellitus type 2, hypertension, hyperlipidemia and other comorbidities who presented to the ED initially on 02/08/19 with a chief complaint of a fever and a cough. At that time he tested positive for COVID-19 viral infection but his O2 saturations were 96 to 98% on room air and he was discharged home.  Had worsening symptoms and developed worsening cough, nausea, emesis and one episode of diarrhea for the past 2 to 3 days.  Work-up revealed opacities in the mid and lower left lung as well as the right lung base consistent with COVID-19 viral pneumonia.  Had significantly elevated inflammatory markers.  TRH was asked to admit.  COVID-19 positive on 02/09/2019.   Completed covid-19 directed therapies: 5 days of IV Remdesivir and 5 days of IV decadron.  Will complete 10 days total of decadron, switched to oral form.  Hospital course complicated by generalized weakness for which PT recommended Home health PT/OT.  Required up to 11 L HFNC to maintain O2 saturation greater than 92%.  Oxygen requirement improved now on room air with O2 saturation 92 to 99% at rest.  Home O2 evaluation prior to DC.  On 02/22/2019 O2 saturation decreased to 88% on room air with activity but quickly rebounded into the low 90s with rest per OT's [place], Wendi progress note on 02/22/19.   02/23/19: Seen and examined.  No new complaints.  Vital signs and labs reviewed and are stable.  On the day of discharge, the patient was hemodynamically stable.  He will need to follow-up with his primary care provider and his cardiologist posthospitalization.  He will need to take his medications as prescribed and continue PT OT with assistance and fall precautions.  Patient has to quarantine until March 02, 2019.  Patient understands and agrees to plan.    Hospital Course:  Active Problems:   Pneumonia due to COVID-19 virus  COVID-19 viral pneumonia Positive COVID-19 on 02/09/2019. Reviewed CTA PE done on 02/17/2018 which showed groundglass opacities.  No evidence of PE. Completed 5 days of remdesivir. Completed 3 days of empiric IV antibiotics for presumed community-acquired pneumonia. Albuterol inhaler twice daily as needed for shortness of breath and wheezing Continue Decadron for total of 10 days. Home O2 evaluation prior to DC Follow-up with your PCP  Improving acute hypoxic respiratory failure secondary to COVID-19 viral pneumonia Not on oxygen supplementation at baseline CTA negative for PE Management per above Home O2 eval for dc planning  Type 2 diabetes with hyperglycemia Hemoglobin A1c 9.1  on 02/16/2019. Resume home regimen Follow-up with your PCP  Resolving elevated LFTs likely in the setting of COVID-19 viral infection Acute hepatitis panel negative LFTs trending down and normalizing on repeated lab. 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 Denies any anginal symptoms. Continue aspirin 81 mg daily Continue metoprolol 12.5 mg twice daily. Continue Crestor 10 mg nightly Follow-up with your cardiologist  Chronic diastolic CHF Last 2D echo done in 2017 revealed normal LVEF with grade 2 diastolic dysfunction. 2D echo done on 02/18/2019 showed preserved LVEF 50 to 55% Continue strict I's and O's and daily weight Continue cardiac medications BNP elevated, no baseline to compare Was diuresed. Net I&O -7.9 L. Follow-up with your cardiologist  Paroxysmal A. fib Currently in normal sinus rhythm Not on [facility] for CVA prevention Rate controlled on beta-blocker  continue to monitor on telemetry  Hyperlipidemia Continue Crestor  Type 2 diabetes with hyperglycemia Continue insulin coverage Hemoglobin A1c 9.1 on 02/16/2019.  Physical debility PT OT recommended home health with PT OT Continue PT OT with assistance and fall precautions. Continue out of bed to chair with assistance on every shift Appreciate transition of care team assistance with home health services arrangement.  Code Status:FULL CODE   Discharge Exam: BP 123/75 (BP Location: Right Arm)   Pulse 66   Temp 98.1 F (36.7 C) (Oral)   Resp 11   Ht 6' (1.829 m)   Wt 79.9 kg   SpO2 93%   BMI 23.89 kg/m  . General: 74 y.o. year-old male well developed well nourished in no acute distress.  Alert and oriented x3. . Cardiovascular: Regular rate and rhythm with no rubs or gallops.  No thyromegaly or JVD noted.   [name] Kitchen Respiratory: Clear to auscultation with no wheezes or rales. Good inspiratory effort. . Abdomen: Soft nontender nondistended with normal bowel sounds x4 quadrants. . Musculoskeletal: No lower extremity edema. 2/4 pulses in all 4 extremities. [name] Kitchen Psychiatry: Mood is appropriate for condition and setting  Discharge  Instructions You were cared for by a hospitalist during your hospital stay. If you have any questions about your discharge medications or the care you received while you were in the hospital after you are discharged, you can call the unit and asked to speak with the hospitalist on call if the hospitalist that took care of you is not available. Once you are discharged, your primary care physician will handle any further medical issues. Please note that NO REFILLS for any discharge medications will be authorized once you are discharged, as it is imperative that you return to your primary care physician (or establish a relationship with a primary care physician if you do not have one) for your aftercare needs so that they can reassess your need for medications and monitor your lab values.   Allergies as of 02/23/2019      Reactions   Amiodarone Nausea And Vomiting   Quinolones    Patient was warned about not using Cipro and similar antibiotics. Recent studies have raised concern that fluoroquinolone antibiotics could be associated with an increased risk of aortic aneurysm Fluoroquinolones have non-antimicrobial properties that might jeopardise the integrity of the extracellular matrix of the vascular wall In a  propensity score matched cohort study in [place], there was a 66% increased rate of aortic aneurysm or dissection associated with oral  fluoroquinolone use, compared wit      Medication List    STOP taking these medications   potassium chloride 10 MEQ tablet Commonly known as: KLOR-CON     TAKE these medications   albuterol 108 (90 Base) MCG/ACT inhaler Commonly known as: VENTOLIN HFA Inhale 1-2 puffs into the lungs 2 (two) times daily as needed for wheezing or shortness of breath.   ascorbic acid 500 MG tablet Commonly known as: VITAMIN C Take 1 tablet (500 mg total) by mouth daily.   aspirin EC 81 MG tablet Take 81 mg by mouth daily.   B-D ULTRAFINE III SHORT PEN 31G X 8 MM  Misc Generic drug: Insulin Pen Needle USE 4 TIMES DAILY AS DIRECTED   [name] KwikPen 100 UNIT/ML Sopn INJECT 0.23 MLS (23 UNITS TOTAL) INTO THE SKIN AT BEDTIME. What changed: how much to take   dexamethasone 6 MG tablet Commonly known as: DECADRON Take 1 tablet (6 mg total) by mouth daily for 3 days.   famotidine 20 MG tablet Commonly known as: PEPCID Take 1 tablet (20 mg total) by mouth 2 (two) times daily.   fluticasone 50 MCG/ACT nasal spray Commonly known as: FLONASE Place 2 sprays into both nostrils daily for 3 days.   glipiZIDE 10 MG 24 hr tablet Commonly known as: GLUCOTROL XL TAKE 1 TABLET (10 MG TOTAL) BY MOUTH DAILY WITH BREAKFAST.   hydrochlorothiazide 25 MG tablet Commonly known as: HYDRODIURIL Take 1 tablet (25 mg total) by mouth daily.   metFORMIN 1000 MG tablet Commonly known as: GLUCOPHAGE TAKE 1 TABLET (1,000 MG TOTAL) BY MOUTH 2 (TWO) TIMES DAILY WITH A MEAL.   metoprolol tartrate 25 MG tablet Commonly known as: LOPRESSOR Take 0.5 tablets (12.5 mg total) by mouth 2 (two) times daily. What changed:   medication strength  how much to take   OneTouch Verio test strip Generic drug: glucose blood TEST BLOOD SUGARS DAILY. DX: E11.9   rosuvastatin 20 MG tablet Commonly known as: CRESTOR TAKE 1 TABLET BY MOUTH EVERY DAY   Vitamin D3 25 MCG tablet Commonly known as: Vitamin D Take 1 tablet (1,000 Units total) by mouth daily.   zinc sulfate 220 (50 Zn) MG capsule Take 1 capsule (220 mg total) by mouth daily.            Durable Medical Equipment  (From admission, onward)         Start     Ordered   02/18/19 1437  For home use only DME 3 n 1  Once     02/18/19 1436         Allergies  Allergen Reactions  . Amiodarone Nausea And Vomiting  . Quinolones     Patient was warned about not using Cipro and similar antibiotics. Recent studies have raised concern that fluoroquinolone antibiotics could be associated with an increased risk of  aortic aneurysm Fluoroquinolones have non-antimicrobial properties that might jeopardise the integrity of the extracellular matrix of the vascular wall In a  propensity score matched cohort study in [place], there was a 66% increased rate of aortic aneurysm or dissection associated with oral fluoroquinolone use, compared wit   Follow-up Information    [facility] Follow up.   Specialty: Home Health Services Why: Home Health Contact information: [place] [address] [phone]        [facility] Follow up.   Why: bedside commode       [name], NP. Call in  1 day(s).   Specialty: Family Medicine Why: Please call for a post hospital follow up appointment. Contact information: [place] [address] [phone]        [name], [name], MD .   Specialty: Cardiology Contact information: [place] [address] [phone]            The results of significant diagnostics from this hospitalization (including imaging, microbiology, ancillary and laboratory) are listed below for reference.    Significant Diagnostic Studies: CT ANGIO CHEST PE W OR WO CONTRAST  Result Date: 02/18/2019 CLINICAL DATA:  74 year old male with chest pain, increasing oxygen dependence. COVID-19. EXAM: CT ANGIOGRAPHY CHEST WITH CONTRAST TECHNIQUE: Multidetector CT imaging of the chest was performed using the standard protocol during bolus administration of intravenous contrast. Multiplanar CT image reconstructions and MIPs were obtained to evaluate the vascular anatomy. CONTRAST:  149mL OMNIPAQUE IOHEXOL 350 MG/ML SOLN COMPARISON:  Portable chest 02/15/2019. CTA chest 11/07/2018 and earlier. FINDINGS: Cardiovascular: Good contrast bolus timing in the pulmonary arterial tree. No focal filling defect identified in the pulmonary arteries to suggest acute pulmonary embolism. Stable postoperative changes to  the ascending aorta with ectasia of the proximal arch up to 4.2 centimeters diameter. Little contrast in the aorta today. Extensive coronary artery calcified plaque (series 6, image 167). No cardiomegaly or pericardial effusion. Mediastinum/Nodes: No lymphadenopathy. Lungs/Pleura: Lower lung volumes compared to September with bilateral peripheral and peribronchial ground-glass opacity compatible with COVID-19. Pneumonia. Major airways remain patent. The right middle and upper lobes are least affected. No pleural effusion. Upper Abdomen: Calcified aortic atherosclerosis. Negative visible noncontrast liver, gallbladder, spleen, pancreas, adrenal glands and kidneys. 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 Musculoskeletal: Prior sternotomy. No acute osseous abnormality identified. Review of the MIP images confirms the above findings. IMPRESSION: 1. Negative for acute pulmonary embolus. 2. Lower lung volumes with bilateral peripheral and peribronchial ground-glass opacity compatible with COVID-19 pneumonia. No pleural effusion. 3. Calcified coronary artery and Aortic atherosclerosis (ICD10-I70.0). 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 Electronically Signed   By: Genevie Ann M.D.   On: 02/18/2019 11:58   DG Chest Port 1 View  Result Date: 02/15/2019 CLINICAL DATA:  COVID-19 positivity and shortness of breath EXAM: PORTABLE CHEST 1 VIEW COMPARISON:  02/09/2019 FINDINGS: Cardiac shadow is stable. Postsurgical changes are noted. Opacities are noted in the mid and lower left lung as well as the right lung base consistent with the given clinical history. No bony abnormality is noted. IMPRESSION: Patchy opacities consistent with the given clinical history. Electronically Signed   By: Inez Catalina M.D.   On: 02/15/2019 15:52   DG Chest Portable 1 View  Result Date: 02/09/2019 CLINICAL DATA:  Fever and cough EXAM: PORTABLE CHEST 1 VIEW COMPARISON:  12/20/2015 FINDINGS:  Unchanged remote median sternotomy for CABG. Prosthetic aortic valve. No focal airspace consolidation or pulmonary edema. No pleural effusion or pneumothorax. IMPRESSION: No active cardiopulmonary disease. Electronically Signed   By: Ulyses Jarred M.D.   On: 02/09/2019 01:10   ECHOCARDIOGRAM LIMITED  Result Date: 02/18/2019   ECHOCARDIOGRAM LIMITED REPORT   Patient Name:   KRIST BUCHOLZ Date of Exam: 02/18/2019 Medical Rec #:  [id number]      Height:       72.0 in Accession #:    [id number]     Weight:       180.6 lb Date of Birth:  [birth year]  BSA:          2.04 m Patient Age:    23 years       BP:           132/70 mmHg Patient Gender: M              HR:           68 bpm. Exam Location:  Inpatient  Procedure: Limited Echo Indications:    CHF-Acute Diastolic 0000000  History:        Patient has prior history of Echocardiogram examinations. Aortic                 Valve: A 86mm Magna pericardial aortic valve bioprosthesis                 Procedure Date: 10/29/2015 Aortic valve replacement with tissue                 valve. COVID-19.  Sonographer:    Clayton Lefort RDCS (AE) Referring Phys: [id number] Kayleen Memos  Sonographer Comments: COVID-19. IMPRESSIONS  1. Left ventricular ejection fraction, by visual estimation, is 50 to 55%. There is moderately increased left ventricular wall thickness.  2. Left ventricular diastolic parameters are consistent with Grade I diastolic dysfunction (impaired relaxation).  3. Global right ventricle has normal systolc function.The right ventricular size is normal.  4. 65mm Magna pericardial aortic valve bioprosthesis valve is present in the aortic position. Procedure Date: 10/29/2015 Echo findings show normal structure and function of the aortic prosthesis. Aortic valve mean gradient measures 6.0 mmHg. No aortic valve regurgitation.  5. No evidence of mitral valve regurgitation.  6. Tricuspid valve regurgitation is trivial.  7. TR signal is inadequate for assessing pulmonary artery  systolic pressure.  8. The inferior vena cava is normal in size with <50% respiratory variability, suggesting right atrial pressure of 8 mmHg. FINDINGS  Left Ventricle: Left ventricular ejection fraction, by visual estimation, is 50 to 55%. There is moderately increased left ventricular wall thickness. Left ventricular diastolic parameters are consistent with Grade I diastolic dysfunction (impaired relaxation). Indeterminate filling pressures. Right Ventricle: The right ventricular size is normal. Global RV systolic function is has normal systolic function. Mitral Valve: MV Area by PHT, 2.76 cm. MV PHT, 79.75 msec. No evidence of mitral valve regurgitation. Tricuspid Valve: Tricuspid valve regurgitation is trivial. Aortic Valve: The aortic valve has been repaired/replaced. Aortic valve mean gradient measures 6.0 mmHg. Aortic valve peak gradient measures 12.6 mmHg. 56mm Magna pericardial aortic valve bioprosthesis valve is present in the aortic position. Procedure Date: 10/29/2015 Echo findings show normal structure and function of the aortic prosthesis. Pulmonic Valve: The pulmonic valve was grossly normal. Pulmonic valve regurgitation is trivial by color flow Doppler. Pulmonic regurgitation is trivial by color flow Doppler. Venous: The inferior vena cava is normal in size with less than 50% respiratory variability, suggesting right atrial pressure of 8 mmHg. Shunts: The interatrial septum was not well visualized.  LEFT VENTRICLE         Normals PLAX 2D LVIDd:         4.48 cm 3.6 cm   Diastology                 Normals LVIDs:         3.06 cm 1.7 cm   LV e' lateral:   5.48 cm/s 6.42 cm/s LV PW:         1.44 cm 1.4 cm   LV E/e' lateral: 9.2  15.4 LV IVS:        1.56 cm 1.3 cm   LV e' medial:    4.43 cm/s 6.96 cm/s LV SV:         55 ml   79 ml    LV E/e' medial:  11.4      6.96 LV SV Index:   26.77   45 ml/m2  IVC IVC diam: 1.62 cm LEFT ATRIUM         Index LA diam:    3.20 cm 1.57 cm/m  AORTIC VALVE                     Normals AV Vmax:           177.75 cm/s AV Vmean:          110.750 cm/s 77 cm/s AV VTI:            0.314 m      3.15 cm2 AV Peak Grad:      12.6 mmHg AV Mean Grad:      6.0 mmHg     3 mmHg LVOT Vmax:         96.00 cm/s LVOT Vmean:        63.700 cm/s  75 cm/s LVOT VTI:          0.163 m      25.3 cm LVOT/AV VTI ratio: 0.52         1  AORTA                 Normals Ao Root diam: 2.80 cm 31 mm Ao Asc diam:  3.50 cm 31 mm MITRAL VALVE              Normals MV Area (PHT): 2.76 cm             SHUNTS MV PHT:        79.75 msec 55 ms     Systemic VTI: 0.16 m MV Decel Time: 275 msec   187 ms MV E velocity: 50.60 cm/s 103 cm/s MV A velocity: 61.30 cm/s 70.3 cm/s MV E/A ratio:  0.83       1.5  Cherlynn Kaiser MD Electronically signed by Cherlynn Kaiser MD Signature Date/Time: 02/18/2019/10:41:54 PM    Final     Microbiology: Recent Results (from the past 240 hour(s))  Blood Culture (routine x 2)     Status: None   Collection Time: 02/15/19  3:00 PM   Specimen: BLOOD  Result Value Ref Range Status   Specimen Description BLOOD LEFT ANTECUBITAL  Final   Special Requests   Final    BOTTLES DRAWN AEROBIC AND ANAEROBIC Blood Culture results may not be optimal due to an inadequate volume of blood received in culture bottles   Culture   Final    NO GROWTH 5 DAYS Performed at [facility], [place] [address] [place], [place] [address]    Report Status 02/20/2019 FINAL  Final  Blood Culture (routine x 2)     Status: None   Collection Time: 02/15/19  4:06 PM   Specimen: BLOOD LEFT HAND  Result Value Ref Range Status   Specimen Description BLOOD LEFT HAND  Final   Special Requests   Final    BOTTLES DRAWN AEROBIC ONLY Blood Culture results may not be optimal due to an inadequate volume of blood received in culture bottles   Culture   Final    NO GROWTH 5 DAYS Performed  at [facility], [place] [address] [place], [place] [address]    Report Status 02/20/2019 FINAL  Final     Labs: Basic Metabolic  Panel: Recent Labs  Lab 02/19/19 0640 02/20/19 0341 02/21/19 1023 02/22/19 0418  NA 134* 135 133*  --   K 4.8 4.8 4.3  --   CL 94* 94* 90*  --   CO2 27 30 29   --   GLUCOSE 247* 207* 311*  --   BUN 37* 44* 41*  --   CREATININE 1.10 1.18 1.17 1.27*  CALCIUM 9.0 9.2 9.0  --    Liver Function Tests: Recent Labs  Lab 02/19/19 0640 02/20/19 0341 02/21/19 1023  AST 67* 76* 37  ALT 154* 165* 116*  ALKPHOS 110 110 95  BILITOT 0.8 0.6 0.8  PROT 7.2 7.3 7.0  ALBUMIN 2.8* 2.9* 2.9*   No results for input(s): LIPASE, AMYLASE in the last 168 hours. No results for input(s): AMMONIA in the last 168 hours. CBC: Recent Labs  Lab 02/19/19 0640 02/20/19 0341 02/21/19 1023  WBC 12.5* 13.0* 13.7*  NEUTROABS 11.0* 11.4* 11.7*  HGB 13.9 14.5 14.2  HCT 41.9 42.5 41.4  MCV 89.3 87.3 87.0  PLT 378 419* 502*   Cardiac Enzymes: No results for input(s): CKTOTAL, CKMB, CKMBINDEX, TROPONINI in the last 168 hours. BNP: BNP (last 3 results) Recent Labs    02/16/19 0414  BNP 427.2*    ProBNP (last 3 results) No results for input(s): PROBNP in the last 8760 hours.  CBG: Recent Labs  Lab 02/22/19 0753 02/22/19 1249 02/22/19 1738 02/22/19 2057 02/23/19 0758  GLUCAP 272* 296* 307* 251* 91       Signed:  Kayleen Memos, MD [facility] 02/23/2019, 10:22 AM

## 2019-02-23 NOTE — Care Management (Signed)
Notified [facility] today. Patient verified he had all DME to go home with in room, and that his daughter would be providing his ride home.

## 2019-02-24 ENCOUNTER — Telehealth: Payer: Self-pay

## 2019-02-24 NOTE — Telephone Encounter (Signed)
Left message to call our office to schedule a telephone or video hospital follow up appointment on Fri 02/28/19 afternoon with [facility].

## 2019-02-28 ENCOUNTER — Encounter: Payer: Self-pay | Admitting: Adult Health

## 2019-02-28 ENCOUNTER — Other Ambulatory Visit: Payer: Self-pay

## 2019-02-28 ENCOUNTER — Telehealth (INDEPENDENT_AMBULATORY_CARE_PROVIDER_SITE_OTHER): Payer: Medicare PPO | Admitting: Adult Health

## 2019-02-28 DIAGNOSIS — E1149 Type 2 diabetes mellitus with other diabetic neurological complication: Secondary | ICD-10-CM

## 2019-02-28 DIAGNOSIS — Z952 Presence of prosthetic heart valve: Secondary | ICD-10-CM

## 2019-02-28 DIAGNOSIS — U071 COVID-19: Secondary | ICD-10-CM

## 2019-02-28 DIAGNOSIS — E1165 Type 2 diabetes mellitus with hyperglycemia: Secondary | ICD-10-CM

## 2019-02-28 DIAGNOSIS — IMO0002 Reserved for concepts with insufficient information to code with codable children: Secondary | ICD-10-CM

## 2019-02-28 DIAGNOSIS — R112 Nausea with vomiting, unspecified: Secondary | ICD-10-CM

## 2019-02-28 DIAGNOSIS — I25810 Atherosclerosis of coronary artery bypass graft(s) without angina pectoris: Secondary | ICD-10-CM | POA: Diagnosis not present

## 2019-02-28 DIAGNOSIS — J1282 Pneumonia due to coronavirus disease 2019: Secondary | ICD-10-CM

## 2019-02-28 DIAGNOSIS — I480 Paroxysmal atrial fibrillation: Secondary | ICD-10-CM

## 2019-02-28 MED ORDER — ONDANSETRON HCL 4 MG PO TABS: 4.0000 mg | ORAL_TABLET | Freq: Three times a day (TID) | ORAL | 1 refills | Status: DC | PRN

## 2019-02-28 NOTE — Progress Notes (Signed)
Virtual Visit via Video Note  I connected with [name] on 02/28/19 at  1:00 PM EST by a video enabled telemedicine application and verified that I am speaking with the correct person using two identifiers.  Location patient: home Location provider:work or home office Persons participating in the virtual visit: patient, provider  I discussed the limitations of evaluation and management by telemedicine and the availability of in person appointments. The patient expressed understanding and agreed to proceed.   HPI:  74 year old male who  has a past medical history of Aortic stenosis, Carpal tunnel syndrome (09/24/2014), Coronary artery disease, COVID-19, Diabetes mellitus, type 2 ([place]), Erectile dysfunction, Essential hypertension, and Hyperlipidemia.   He is being evaluated today for TCM visit   Admit Date: 02/15/2019 Discharge Date: 02/23/2019  He initially presented to the emergency room on 02/08/2019 with a chief complaint of a fever and a cough.  At that time he tested positive for COVID-19 but his oxygen saturations were 96 to 98% on room air and he was discharged home.  Symptoms worsened and developed a worsening cough, nausea, emesis, and one episode of diarrhea over 2 to 3 days after initial discharge.  His work-up revealed opacities in the mid and lower left lung as well as right lung base consistent with COVID-19 viral pneumonia.  Had significantly elevated inflammatory markers.  He was admitted and had 5 days of IV remdesivir and 5 days of IV Decadron.  His hospital course was complicated by generalized weakness for which PT recommended home health PT OT.  He required up to 11 L HFNC to maintain oxygen saturation greater than 92%.  Higher to discharge his O2 saturation was 92 to 99% on room air.  Upon discharge he was continued on oral Decadron for a total of 10 days.  He was advised to quarantine at home until March 02, 2019.  Hospital Course  COVID-19 viral  pneumonia -Positive COVID-19 on 02/09/2019 -His CTA PE done on 02/17/2018 showed groundglass opacities.  There is no evidence of PE -Completed 5 days of remdesivir -Completed 3 days of empiric IV antibiotics for presumed CAP -Albuterol inhaler twice daily as needed for shortness of breath and wheezing -Continue Decadron for total of 10 days  Improving acute hypoxic respiratory failure secondary to COVID-19 viral pneumonia -He is not on oxygen therapy at baseline -As mentioned above his CTA was negative for PE  Type 2 diabetes wutg hyperglycemia -Hemoglobin A1c was 9.1 on 02/16/2019 -Resumed home resident regimen upon discharge  Resolving elevated LFTs likely in the setting of COVID-19 viral infection -Acute hepatitis panel was negative -LFTs trending down and normalizing on repeated lab  CAD status post CABG/history of aortic valve replacement with tissue valve/history of repair and replacement of a sending aorta -Limited 2D echo done during this admission showed normal LVEF -Denied any anginal symptoms -Continued on aspirin 81 mg daily, metoprolol 12.5 mg twice daily, and Crestor 10 mg nightly -Was advised to follow-up with cardiology  Chronic diastolic CHF -2D echo on this admission showed a preserved LVEF of 50 to 55% -BNP was elevated and he was diuresed.  His net I&O was -7.9 L  PAF -Normal sinus rhythm throughout hospital admission -Rate controlled with beta-blocker  Hyperlipidemia  - Continue Crestor   Today he reports that his breathing has improved significantly he has been monitoring his pulse ox at home and has been consistently at 98%.  He denies any fevers, chills or shortness of breath.  He continues to  feel fatigued but feels as though this is improving also.  Biggest complaint is that of nausea with some vomiting.  Feels as though he cannot keep anything on his stomach.  Patient states "if I can get this nausea settled then I will be a lot better".  He has not been  monitoring his blood sugars at home since he was discharged from the hospital.  ROS: See pertinent positives and negatives per HPI.  Past Medical History:  Diagnosis Date  . 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 Carpal tunnel syndrome 09/24/2014   Bilateral  . 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 COVID-19   . Diabetes mellitus, type 2 ([place])   . Erectile dysfunction   . Essential hypertension   . Hyperlipidemia     Past Surgical History:  Procedure Laterality Date  . AORTIC VALVE REPLACEMENT N/A 10/29/2015   Procedure: AORTIC VALVE REPLACEMENT (AVR) WITH 23MM MAGNA EASE TISSUE VALVE.;  Surgeon: [name], MD;  Location: [facility];  Service: Open Heart Surgery;  Laterality: N/A;  . CARDIAC CATHETERIZATION N/A 10/27/2015   Procedure: Left Heart Cath and Coronary Angiography;  Surgeon: [name], MD;  Location: [facility];  Service: Cardiovascular;  Laterality: N/A;  . CORONARY ARTERY BYPASS GRAFT N/A 10/29/2015   Procedure: CORONARY ARTERY BYPASS GRAFTING (CABG) x Four UTILIZING THE LEFT INTERNAL MAMMARY ARTERY AND ENDOSCOPICALLY HARVESTED RIGHT SAPEHENEOUS VEINS.;  Surgeon: [name], MD;  Location: [facility];  Service: Open Heart Surgery;  Laterality: N/A;  . CYST REMOVAL HAND Right   . ESOPHAGOGASTRODUODENOSCOPY N/A 12/02/2015   Procedure: ESOPHAGOGASTRODUODENOSCOPY (EGD);  Surgeon: [name], MD;  Location: [facility];  Service: Gastroenterology;  Laterality: N/A;  . REPLACEMENT ASCENDING AORTA N/A 10/29/2015   Procedure: REPLACEMENT OF ASCENDING AORTA USING 34MM X 30CM WOVEN DOUBLE VELOUR VASCULAR GRAFT.;  Surgeon: [name], MD;  Location: [facility];  Service: Open Heart Surgery;  Laterality: N/A;  . TEE WITHOUT CARDIOVERSION N/A 10/29/2015   Procedure: TRANSESOPHAGEAL ECHOCARDIOGRAM (TEE);  Surgeon: [name], MD;  Location: [facility];  Service: Open Heart Surgery;  Laterality:  N/A;  . UVULECTOMY N/A 12/10/2015   Procedure: UVULECTOMY;  Surgeon: [name], MD;  Location: [facility];  Service: ENT;  Laterality: N/A;    Family History  Problem Relation Age of Onset  . Cancer Father        Throat cancer  . Diabetes Sister        Current Outpatient Medications:  .  albuterol (VENTOLIN HFA) 108 (90 Base) MCG/ACT inhaler, Inhale 1-2 puffs into the lungs 2 (two) times daily as needed for wheezing or shortness of breath., Disp: 8 g, Rfl: 0 .  ascorbic acid (VITAMIN C) 500 MG tablet, Take 1 tablet (500 mg total) by mouth daily., Disp: 60 tablet, Rfl: 0 .  aspirin EC 81 MG tablet, Take 81 mg by mouth daily., Disp: , Rfl:  .  B-D ULTRAFINE III SHORT PEN 31G X 8 MM MISC, USE 4 TIMES DAILY AS DIRECTED, Disp: 100 each, Rfl: 0 .  cholecalciferol (VITAMIN D) 25 MCG tablet, Take 1 tablet (1,000 Units total) by mouth daily., Disp: 60 tablet, Rfl: 0 .  famotidine (PEPCID) 20 MG tablet, Take 1 tablet (20 mg total) by mouth 2 (two) times daily., Disp: 30 tablet, Rfl: 0 .  glipiZIDE (GLUCOTROL XL) 10 MG 24 hr tablet, TAKE 1 TABLET (10 MG  TOTAL) BY MOUTH DAILY WITH BREAKFAST., Disp: 90 tablet, Rfl: 0 .  hydrochlorothiazide (HYDRODIURIL) 25 MG tablet, Take 1 tablet (25 mg total) by mouth daily., Disp: 90 tablet, Rfl: 3 .  Insulin Glargine ([name] KWIKPEN) 100 UNIT/ML SOPN, INJECT 0.23 MLS (23 UNITS TOTAL) INTO THE SKIN AT BEDTIME. (Patient taking differently: Inject 25 Units into the skin at bedtime. ), Disp: 5 pen, Rfl: 1 .  metFORMIN (GLUCOPHAGE) 1000 MG tablet, TAKE 1 TABLET (1,000 MG TOTAL) BY MOUTH 2 (TWO) TIMES DAILY WITH A MEAL., Disp: 180 tablet, Rfl: 0 .  metoprolol tartrate (LOPRESSOR) 25 MG tablet, Take 0.5 tablets (12.5 mg total) by mouth 2 (two) times daily., Disp: 60 tablet, Rfl: 0 .  ONETOUCH VERIO test strip, TEST BLOOD SUGARS DAILY. DX: E11.9, Disp: 100 each, Rfl: 3 .  rosuvastatin (CRESTOR) 20 MG tablet, TAKE 1 TABLET BY MOUTH EVERY DAY (Patient taking differently:  Take 20 mg by mouth daily. ), Disp: 90 tablet, Rfl: 3 .  zinc sulfate 220 (50 Zn) MG capsule, Take 1 capsule (220 mg total) by mouth daily., Disp: 60 capsule, Rfl: 0 .  fluticasone (FLONASE) 50 MCG/ACT nasal spray, Place 2 sprays into both nostrils daily for 3 days., Disp: 11.1 mL, Rfl: 0 .  ondansetron (ZOFRAN) 4 MG tablet, Take 1 tablet (4 mg total) by mouth every 8 (eight) hours as needed for nausea or vomiting., Disp: 20 tablet, Rfl: 1  EXAM:  VITALS per patient if applicable:  GENERAL: alert, oriented, appears well and in no acute distress  HEENT: atraumatic, conjunttiva clear, no obvious abnormalities on inspection of external nose and ears  NECK: normal movements of the head and neck  LUNGS: on inspection no signs of respiratory distress, breathing rate appears normal, no obvious gross SOB, gasping or wheezing  CV: no obvious cyanosis  MS: moves all visible extremities without noticeable abnormality  PSYCH/NEURO: pleasant and cooperative, no obvious depression or anxiety, speech and thought processing grossly intact  ASSESSMENT AND PLAN:  Discussed the following assessment and plan:  1. Pneumonia due to COVID-19 virus -We reviewed his hospital discharge notes, imaging, and labs that were done during his hospital admission.  All questions answered.  His breathing appears to be back to baseline.  Continue to monitor pulse ox throughout the day.  Follow-up if pulse ox drops below 92% consistently  2. S/P AVR (aortic valve replacement) -Continue home medications -Follow-up with cardiology  3. Coronary artery disease involving coronary bypass graft of native heart without angina pectoris -Continue home medications -Follow-up with cardiology  4. PAF (paroxysmal atrial fibrillation) (HCC) -Denies chest pain or palpitations. -Continue beta-blocker  5. Type II diabetes mellitus with neurological manifestations, uncontrolled ([place]) -Encouraged to monitor his blood sugars at  home especially since he has been on steroids.  He can titrate his Lantus as needed.  Drink plenty of water throughout the day -Follow up with blood sugars are not being controlled  6. Nausea and vomiting, intractability of vomiting not specified, unspecified vomiting type -We will prescribe Zofran for nausea and vomiting.  Advise follow-up if symptoms are not controlled - ondansetron (ZOFRAN) 4 MG tablet; Take 1 tablet (4 mg total) by mouth every 8 (eight) hours as needed for nausea or vomiting.  Dispense: 20 tablet; Refill: 1    I discussed the assessment and treatment plan with the patient. The patient was provided an opportunity to ask questions and all were answered. The patient agreed with the plan and demonstrated an understanding of the instructions.  The patient was advised to call back or seek an in-person evaluation if the symptoms worsen or if the condition fails to improve as anticipated.   [name], NP

## 2019-03-02 ENCOUNTER — Encounter (HOSPITAL_COMMUNITY): Payer: Self-pay | Admitting: Emergency Medicine

## 2019-03-02 ENCOUNTER — Emergency Department (HOSPITAL_COMMUNITY)
Admission: EM | Admit: 2019-03-02 | Discharge: 2019-03-02 | Disposition: A | Discharge status: Home / Self Care | Payer: Medicare PPO | Attending: Emergency Medicine | Admitting: Emergency Medicine

## 2019-03-02 ENCOUNTER — Emergency Department (HOSPITAL_COMMUNITY): Payer: Medicare PPO

## 2019-03-02 ENCOUNTER — Other Ambulatory Visit: Payer: Self-pay | Admitting: Adult Health

## 2019-03-02 DIAGNOSIS — I10 Essential (primary) hypertension: Secondary | ICD-10-CM | POA: Insufficient documentation

## 2019-03-02 DIAGNOSIS — Z794 Long term (current) use of insulin: Secondary | ICD-10-CM | POA: Insufficient documentation

## 2019-03-02 DIAGNOSIS — Z20822 Contact with and (suspected) exposure to covid-19: Secondary | ICD-10-CM | POA: Diagnosis not present

## 2019-03-02 DIAGNOSIS — Z8616 Personal history of COVID-19: Secondary | ICD-10-CM | POA: Diagnosis not present

## 2019-03-02 DIAGNOSIS — E119 Type 2 diabetes mellitus without complications: Secondary | ICD-10-CM | POA: Insufficient documentation

## 2019-03-02 DIAGNOSIS — Z760 Encounter for issue of repeat prescription: Secondary | ICD-10-CM

## 2019-03-02 DIAGNOSIS — IMO0002 Reserved for concepts with insufficient information to code with codable children: Secondary | ICD-10-CM

## 2019-03-02 DIAGNOSIS — R42 Dizziness and giddiness: Secondary | ICD-10-CM

## 2019-03-02 DIAGNOSIS — E1140 Type 2 diabetes mellitus with diabetic neuropathy, unspecified: Secondary | ICD-10-CM

## 2019-03-02 DIAGNOSIS — Z79899 Other long term (current) drug therapy: Secondary | ICD-10-CM | POA: Insufficient documentation

## 2019-03-02 DIAGNOSIS — Z7982 Long term (current) use of aspirin: Secondary | ICD-10-CM | POA: Diagnosis not present

## 2019-03-02 DIAGNOSIS — R002 Palpitations: Secondary | ICD-10-CM | POA: Diagnosis not present

## 2019-03-02 DIAGNOSIS — I25810 Atherosclerosis of coronary artery bypass graft(s) without angina pectoris: Secondary | ICD-10-CM | POA: Diagnosis not present

## 2019-03-02 LAB — CBC
HCT: 43.4 % (ref 39.0–52.0)
Hemoglobin: 14.2 g/dL (ref 13.0–17.0)
MCH: 29.4 pg (ref 26.0–34.0)
MCHC: 32.7 g/dL (ref 30.0–36.0)
MCV: 89.9 fL (ref 80.0–100.0)
Platelets: 330 10*3/uL (ref 150–400)
RBC: 4.83 MIL/uL (ref 4.22–5.81)
RDW: 12.6 % (ref 11.5–15.5)
WBC: 16 10*3/uL — ABNORMAL HIGH (ref 4.0–10.5)
nRBC: 0 % (ref 0.0–0.2)

## 2019-03-02 LAB — CBG MONITORING, ED
Glucose-Capillary: 68 mg/dL — ABNORMAL LOW (ref 70–99)
Glucose-Capillary: 86 mg/dL (ref 70–99)

## 2019-03-02 LAB — HEPATIC FUNCTION PANEL
ALT: 29 U/L (ref 0–44)
AST: 21 U/L (ref 15–41)
Albumin: 2.9 g/dL — ABNORMAL LOW (ref 3.5–5.0)
Alkaline Phosphatase: 73 U/L (ref 38–126)
Bilirubin, Direct: 0.2 mg/dL (ref 0.0–0.2)
Indirect Bilirubin: 0.7 mg/dL (ref 0.3–0.9)
Total Bilirubin: 0.9 mg/dL (ref 0.3–1.2)
Total Protein: 7 g/dL (ref 6.5–8.1)

## 2019-03-02 LAB — RESPIRATORY PANEL BY RT PCR (FLU A&B, COVID)
Influenza A by PCR: NEGATIVE
Influenza B by PCR: NEGATIVE
SARS Coronavirus 2 by RT PCR: NEGATIVE

## 2019-03-02 LAB — BASIC METABOLIC PANEL
Anion gap: 14 (ref 5–15)
BUN: 17 mg/dL (ref 8–23)
CO2: 28 mmol/L (ref 22–32)
Calcium: 9.2 mg/dL (ref 8.9–10.3)
Chloride: 94 mmol/L — ABNORMAL LOW (ref 98–111)
Creatinine, Ser: 1.16 mg/dL (ref 0.61–1.24)
GFR calc Af Amer: >60 mL/min (ref >60)
GFR calc non Af Amer: >60 mL/min (ref >60)
Glucose, Bld: 78 mg/dL (ref 70–99)
Potassium: 3.7 mmol/L (ref 3.5–5.1)
Sodium: 136 mmol/L (ref 135–145)

## 2019-03-02 LAB — LIPASE, BLOOD: Lipase: 29 U/L (ref 11–51)

## 2019-03-02 MED ORDER — ONDANSETRON HCL 4 MG/2ML IJ SOLN
4.0000 mg | Freq: Once | INTRAMUSCULAR | Status: AC
  Administered 2019-03-02: 09:00:00 4 mg via INTRAVENOUS
  Filled 2019-03-02: qty 2

## 2019-03-02 MED ORDER — SODIUM CHLORIDE 0.9 % IV BOLUS
1000.0000 mL | Freq: Once | INTRAVENOUS | Status: AC
  Administered 2019-03-02: 09:00:00 1000 mL via INTRAVENOUS

## 2019-03-02 MED ORDER — SODIUM CHLORIDE 0.9% FLUSH
3.0000 mL | Freq: Once | INTRAVENOUS | Status: AC
  Administered 2019-03-02: 3 mL via INTRAVENOUS

## 2019-03-02 MED ORDER — MECLIZINE HCL 25 MG PO TABS: 25.0000 mg | ORAL_TABLET | Freq: Three times a day (TID) | ORAL | 0 refills | Status: DC | PRN

## 2019-03-02 MED ORDER — MECLIZINE HCL 25 MG PO TABS
25.0000 mg | ORAL_TABLET | Freq: Once | ORAL | Status: AC
  Administered 2019-03-02: 09:00:00 25 mg via ORAL
  Filled 2019-03-02: qty 1

## 2019-03-02 NOTE — Discharge Instructions (Addendum)
Hold your insulin as discussed.  Take meclizine as needed for dizziness.  Follow-up with your primary care doctor.

## 2019-03-02 NOTE — ED Triage Notes (Addendum)
Pt arrives via [facility] from home for c/o palpitations, dizziness and generalized weakness with decreased mobility over the past week. Pt called out last night for similar symptoms and ems checked him out but did not transport. Pt recently recovered from covid/pna 02/09/20. EMS VS: cbg 104, 95% on 2L O2-89% on room air initially, 142/74, HR 88, RR 14, Temp 97.5. pt a/ox4. Speech clear, no neuro deficits noted.

## 2019-03-02 NOTE — ED Provider Notes (Signed)
[facility] Provider Note   CSN: [id number] Arrival date & time: 03/02/19  0747     History Chief Complaint  Patient presents with  . Covid/Dizziness  . Palpitations    [name] is a 74 y.o. male.  The history is provided by the patient.  Dizziness Quality:  Imbalance and vertigo Severity:  Mild Onset quality:  Gradual Timing:  Constant Progression:  Waxing and waning Chronicity:  New Context: standing up   Relieved by:  Nothing Worsened by:  Nothing Associated symptoms: palpitations   Associated symptoms: no blood in stool, no chest pain, no diarrhea, no headaches, no shortness of breath, no tinnitus, no vision changes, no vomiting and no weakness   Risk factors comment:  CAD, recent COVID      Past Medical History:  Diagnosis Date  . 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 Carpal tunnel syndrome 09/24/2014   Bilateral  . 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 COVID-19   . Diabetes mellitus, type 2 ([place])   . Erectile dysfunction   . Essential hypertension   . Hyperlipidemia     Patient Active Problem List   Diagnosis Date Noted  . Pneumonia due to COVID-19 virus 02/15/2019  . S/P AVR (aortic valve replacement) 11/14/2016  . 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 Uvular swelling 12/06/2015  . Tibial plateau fracture, left, closed, initial encounter 12/02/2015  . Coronary artery disease involving coronary bypass graft of native heart without angina pectoris   . PAF (paroxysmal atrial fibrillation) ([place]) 11/24/2015  . Orthostatic hypotension   . Fungus present in urine   . Hx of CABG (Sept 2017) 11/13/2015  . Atrial fibrillation (post op after CABG Sept 2017) 11/13/2015  . Orthostatic syncope 11/13/2015  . Elevated troponin 11/13/2015  . Coronary artery disease involving native coronary artery of native heart without angina  pectoris   . Aortic stenosis s/p tissue valve (Sept 2017) 10/28/2015  . Atypical angina ([place]) 10/27/2015  . Abnormal nuclear stress test 10/27/2015  . Coronary artery disease involving native heart with angina pectoris ([place]) 10/27/2015  . Aortic aneurysm ([place]) 07/23/2015  . Carpal tunnel syndrome 09/24/2014  . Left carotid bruit 05/07/2013  . Diabetic retinopathy ([place]) 09/23/2012  . Diminished pulses in lower extremity 06/17/2012  . Chest pain 03/22/2011  . Paresthesia of foot 11/04/2010  . Aortic valve disease 04/06/2010  . HYPERTROPHY PROSTATE W/UR OBST & OTH LUTS 03/15/2009  . Hyperlipidemia 12/13/2007  . Type II diabetes mellitus with neurological manifestations, uncontrolled ([place]) 11/29/2007  . ERECTILE DYSFUNCTION 11/29/2007  . Essential hypertension 11/29/2007    Past Surgical History:  Procedure Laterality Date  . AORTIC VALVE REPLACEMENT N/A 10/29/2015   Procedure: AORTIC VALVE REPLACEMENT (AVR) WITH 23MM MAGNA EASE TISSUE VALVE.;  Surgeon: [name], MD;  Location: [facility];  Service: Open Heart Surgery;  Laterality: N/A;  . CARDIAC CATHETERIZATION N/A 10/27/2015   Procedure: Left Heart Cath and Coronary Angiography;  Surgeon: [name], MD;  Location: [facility];  Service: Cardiovascular;  Laterality: N/A;  . CORONARY ARTERY BYPASS GRAFT N/A 10/29/2015   Procedure: CORONARY ARTERY BYPASS GRAFTING (CABG) x Four UTILIZING THE LEFT INTERNAL MAMMARY ARTERY AND ENDOSCOPICALLY HARVESTED RIGHT SAPEHENEOUS VEINS.;  Surgeon: [name], MD;  Location: [facility];  Service: Open Heart Surgery;  Laterality: N/A;  . CYST REMOVAL HAND Right   .  ESOPHAGOGASTRODUODENOSCOPY N/A 12/02/2015   Procedure: ESOPHAGOGASTRODUODENOSCOPY (EGD);  Surgeon: [name], MD;  Location: [facility];  Service: Gastroenterology;  Laterality: N/A;  . REPLACEMENT ASCENDING AORTA N/A 10/29/2015   Procedure: REPLACEMENT OF ASCENDING AORTA USING 34MM X 30CM WOVEN DOUBLE VELOUR  VASCULAR GRAFT.;  Surgeon: [name], MD;  Location: [facility];  Service: Open Heart Surgery;  Laterality: N/A;  . TEE WITHOUT CARDIOVERSION N/A 10/29/2015   Procedure: TRANSESOPHAGEAL ECHOCARDIOGRAM (TEE);  Surgeon: [name], MD;  Location: [facility];  Service: Open Heart Surgery;  Laterality: N/A;  . UVULECTOMY N/A 12/10/2015   Procedure: UVULECTOMY;  Surgeon: [name], MD;  Location: [facility];  Service: ENT;  Laterality: N/A;       Family History  Problem Relation Age of Onset  . Cancer Father        Throat cancer  . Diabetes Sister     Social History   Tobacco Use  . Smoking status: Never Smoker  . Smokeless tobacco: Never Used  Substance Use Topics  . Alcohol use: Yes    Alcohol/week: 1.0 standard drinks    Types: 1 Glasses of wine per week    Comment: 1 x month  . Drug use: No    Home Medications Prior to Admission medications   Medication Sig Start Date End Date Taking? Authorizing Provider  albuterol (VENTOLIN HFA) 108 (90 Base) MCG/ACT inhaler Inhale 1-2 puffs into the lungs 2 (two) times daily as needed for wheezing or shortness of breath. 02/23/19  Yes Kayleen Memos, DO  ascorbic acid (VITAMIN C) 500 MG tablet Take 1 tablet (500 mg total) by mouth daily. 02/23/19  Yes Kayleen Memos, DO  aspirin EC 81 MG tablet Take 81 mg by mouth daily.   Yes [provider]  B-D ULTRAFINE III SHORT PEN 31G X 8 MM MISC USE 4 TIMES DAILY AS DIRECTED 11/30/15  Yes Nafziger, Tommi Rumps, NP  cholecalciferol (VITAMIN D) 25 MCG tablet Take 1 tablet (1,000 Units total) by mouth daily. 02/23/19  Yes Irene Pap N, DO  famotidine (PEPCID) 20 MG tablet Take 1 tablet (20 mg total) by mouth 2 (two) times daily. 02/23/19  Yes Hall, Carole N, DO  glipiZIDE (GLUCOTROL XL) 10 MG 24 hr tablet TAKE 1 TABLET (10 MG TOTAL) BY MOUTH DAILY WITH BREAKFAST. 12/17/18  Yes Nafziger, Tommi Rumps, NP  hydrochlorothiazide (HYDRODIURIL) 25 MG tablet Take 1 tablet (25 mg total) by mouth daily. 07/13/17  Yes  [name], [name], MD  Insulin Glargine ([name] KWIKPEN) 100 UNIT/ML SOPN INJECT 0.23 MLS (23 UNITS TOTAL) INTO THE SKIN AT BEDTIME. Patient taking differently: Inject 25 Units into the skin at bedtime.  07/17/17  Yes Nafziger, Tommi Rumps, NP  metFORMIN (GLUCOPHAGE) 1000 MG tablet TAKE 1 TABLET (1,000 MG TOTAL) BY MOUTH 2 (TWO) TIMES DAILY WITH A MEAL. 02/04/18  Yes Nafziger, Tommi Rumps, NP  metoprolol tartrate (LOPRESSOR) 25 MG tablet Take 0.5 tablets (12.5 mg total) by mouth 2 (two) times daily. 02/23/19  Yes Hall, Carole N, DO  ondansetron (ZOFRAN) 4 MG tablet Take 1 tablet (4 mg total) by mouth every 8 (eight) hours as needed for nausea or vomiting. 02/28/19  Yes Nafziger, Tommi Rumps, NP  ONETOUCH VERIO test strip TEST BLOOD SUGARS DAILY. DX: E11.9 08/14/17  Yes Nafziger, Tommi Rumps, NP  rosuvastatin (CRESTOR) 20 MG tablet TAKE 1 TABLET BY MOUTH EVERY DAY Patient taking differently: Take 20 mg by mouth daily.  02/03/19  Yes [name], [name], MD  zinc sulfate 220 (50  Zn) MG capsule Take 1 capsule (220 mg total) by mouth daily. 02/23/19  Yes Hall, Carole N, DO  fluticasone (FLONASE) 50 MCG/ACT nasal spray Place 2 sprays into both nostrils daily for 3 days. 02/23/19 02/26/19  Kayleen Memos, DO  meclizine (ANTIVERT) 25 MG tablet Take 1 tablet (25 mg total) by mouth 3 (three) times daily as needed for up to 15 doses for dizziness. 03/02/19   Jahi Roza, DO    Allergies    Amiodarone and Quinolones  Review of Systems   Review of Systems  Constitutional: Negative for chills and fever.  HENT: Negative for ear pain, sore throat and tinnitus.   Eyes: Negative for pain and visual disturbance.  Respiratory: Negative for cough and shortness of breath.   Cardiovascular: Positive for palpitations. Negative for chest pain.  Gastrointestinal: Negative for abdominal pain, blood in stool, diarrhea and vomiting.  Genitourinary: Negative for dysuria and hematuria.  Musculoskeletal: Negative for arthralgias and back pain.  Skin:  Negative for color change and rash.  Neurological: Positive for dizziness and light-headedness. Negative for tremors, seizures, syncope, facial asymmetry, speech difficulty, weakness, numbness and headaches.  All other systems reviewed and are negative.   Physical Exam Updated Vital Signs  ED Triage Vitals  Enc Vitals Group     BP 03/02/19 0759 133/85     Pulse Rate 03/02/19 0759 87     Resp 03/02/19 0759 16     Temp 03/02/19 0759 98.6 F (37 C)     Temp Source 03/02/19 0759 Oral     SpO2 03/02/19 0759 100 %     Weight --      Height --      Head Circumference --      [place] Flow --      Pain Score 03/02/19 0831 0     Pain Loc --      Pain Edu? --      Excl. in [place]? --     Physical Exam Vitals and nursing note reviewed.  Constitutional:      General: He is not in acute distress.    Appearance: He is well-developed. He is not ill-appearing.  HENT:     Head: Normocephalic and atraumatic.     Right Ear: Tympanic membrane normal.     Nose: Nose normal.     Mouth/Throat:     Mouth: Mucous membranes are moist.  Eyes:     Extraocular Movements: Extraocular movements intact.     Conjunctiva/sclera: Conjunctivae normal.     Pupils: Pupils are equal, round, and reactive to light.  Cardiovascular:     Rate and Rhythm: Normal rate and regular rhythm.     Pulses: Normal pulses.     Heart sounds: Normal heart sounds. No murmur.  Pulmonary:     Effort: Pulmonary effort is normal. No respiratory distress.     Breath sounds: Normal breath sounds.  Abdominal:     General: Abdomen is flat.     Palpations: Abdomen is soft.     Tenderness: There is no abdominal tenderness.  Musculoskeletal:        General: Normal range of motion.     Cervical back: Normal range of motion and neck supple.  Skin:    General: Skin is warm and dry.     Capillary Refill: Capillary refill takes less than 2 seconds.  Neurological:     General: No focal deficit present.     Mental Status: He is alert and  oriented  to person, place, and time.     Cranial Nerves: No cranial nerve deficit.     Sensory: No sensory deficit.     Motor: No weakness.     Coordination: Coordination normal.     Gait: Gait abnormal (slightly off steady with walking ).     Comments: 5+ out of 5 strength throughout, normal sensation, no drift, normal finger-to-nose finger  Psychiatric:        Mood and Affect: Mood normal.     ED Results / Procedures / Treatments   Labs (all labs ordered are listed, but only abnormal results are displayed) Labs Reviewed  BASIC METABOLIC PANEL - Abnormal; Notable for the following components:      Result Value   Chloride 94 (*)    All other components within normal limits  CBC - Abnormal; Notable for the following components:   WBC 16.0 (*)    All other components within normal limits  HEPATIC FUNCTION PANEL - Abnormal; Notable for the following components:   Albumin 2.9 (*)    All other components within normal limits  CBG MONITORING, ED - Abnormal; Notable for the following components:   Glucose-Capillary 68 (*)    All other components within normal limits  RESPIRATORY PANEL BY RT PCR (FLU A&B, COVID)  LIPASE, BLOOD  CBG MONITORING, ED    EKG EKG Interpretation  Date/Time:  Sunday March 02 2019 07:53:22 EST Ventricular Rate:  88 PR Interval:  152 QRS Duration: 86 QT Interval:  374 QTC Calculation: 452 R Axis:   9 Text Interpretation: Normal sinus rhythm Anterior infarct , age undetermined Abnormal ECG Confirmed by Lennice Sites [phone]) on 03/02/2019 8:18:49 AM   Radiology MR Brain Wo Contrast (neuro protocol)  Result Date: 03/02/2019 CLINICAL DATA:  Vertigo, peripheral. Symptoms over the last week. Decreased mobility. EXAM: MRI HEAD WITHOUT CONTRAST TECHNIQUE: Multiplanar, multiecho pulse sequences of the brain and surrounding structures were obtained without intravenous contrast. COMPARISON:  MR head without and with contrast 12/06/2015 FINDINGS: Brain: No  acute infarct, hemorrhage, or mass lesion is present. Periventricular and subcortical T2 hyperintensities have progressed since prior exam. A posterior right parietal infarct is noted. This is new since the prior exam, but not acute. The ventricles are proportionate to the degree of atrophy. No significant extraaxial fluid collection is present. A remote lacunar infarct is present posteriorly in the right cerebellum, also new from the prior study, but not acute the brainstem and cerebellum are otherwise within normal limits. The internal auditory canals are within normal limits. Vascular: Flow is present in the major intracranial arteries. Skull and upper cervical spine: Degenerative changes of the upper cervical spine are most pronounced at C3-4 with slight retrolisthesis. Cervical spine marrow signal is otherwise normal. Craniocervical junction is within normal limits. Midline structures are unremarkable. Sinuses/Orbits: The paranasal sinuses and mastoid air cells are clear. The globes and orbits are within normal limits. IMPRESSION: 1. No acute intracranial abnormality or significant interval change. 2. Progressive moderate periventricular and subcortical white matter disease bilaterally. This likely reflects the sequela of chronic microvascular ischemia. 3. 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 4. Degenerative changes of the upper cervical spine. Electronically Signed   By: San Morelle M.D.   On: 03/02/2019 12:00   DG Chest Portable 1 View  Result Date: 03/02/2019 CLINICAL DATA:  Weakness, COVID-19 positive EXAM: PORTABLE CHEST 1 VIEW COMPARISON:  02/15/2019 FINDINGS: Postoperative CABG and prosthetic aortic valve. Stable cardiomediastinal contours. Patchy airspace opacities within the  mid to lower aspects of the left long. Minimal right basilar opacity. No pleural effusion. No pneumothorax. IMPRESSION: Patchy bilateral airspace opacities, left greater than right.  Electronically Signed   By: Davina Poke D.O.   On: 03/02/2019 09:05    Procedures Procedures (including critical care time)  Medications Ordered in ED Medications  sodium chloride flush (NS) 0.9 % injection 3 mL (3 mLs Intravenous Given 03/02/19 0919)  meclizine (ANTIVERT) tablet 25 mg (25 mg Oral Given 03/02/19 0919)  sodium chloride 0.9 % bolus 1,000 mL (1,000 mLs Intravenous New Bag/Given 03/02/19 0918)  ondansetron (ZOFRAN) injection 4 mg (4 mg Intravenous Given 03/02/19 0919)    ED Course  I have reviewed the triage vital signs and the nursing notes.  Pertinent labs & imaging results that were available during my care of the patient were reviewed by me and considered in my medical decision making (see chart for details).    MDM Rules/Calculators/A&P  [name] is a 74 year old male with history of high cholesterol, CAD, diabetes, recent Covid infection who presents to the ED with dizziness, palpitations.  Patient with normal vitals.  No fever.  Has had some dizziness fairly persistently over the last several days maybe longer.  Does have some positional components.  Has had some low blood sugars at home.  69 blood sugar upon arrival here.  Has held his insulin due to low blood sugars at home.  Only takes long-acting.  Neurologically he appears intact but he does have an unsteady gait.  No vision changes.  Low suspicion for stroke but will get MRI to further evaluate.  Will check other screening labs and infectious work-up with chest x-ray and urinalysis.  Will give a dose of meclizine in case this is likely more peripheral vertigo.  Will give fluid bolus as it could be some dehydration component as well.  Covid test is negative.  Patient has leukocytosis of 16 but otherwise unremarkable lab work.  No significant anemia or electrolyte abnormality.  Chest x-ray still with some airspace opacities but patient with no respiratory symptoms.  No hypoxia.  MRI was negative for stroke.   Patient felt improved following meclizine.  Possibly some multifactorial reasons for some dizziness.  Recommend that he hold his Lantus as his blood sugars have been low in the mornings.  Will give meclizine for possible vertigo type symptoms.  Recommend close follow-up with primary care doctor.  Discharged in the ED in good condition.  This chart was dictated using voice recognition software.  Despite best efforts to proofread,  errors can occur which can change the documentation meaning.    Final Clinical Impression(s) / ED Diagnoses Final diagnoses:  Dizziness    Rx / DC Orders ED Discharge Orders         Ordered    meclizine (ANTIVERT) 25 MG tablet  3 times daily PRN     03/02/19 1402           Lennice Sites, DO 03/02/19 1402

## 2019-03-02 NOTE — ED Notes (Signed)
Patient Alert and oriented to baseline. Stable and ambulatory to baseline. Patient verbalized understanding of the discharge instructions.  Patient belongings were taken by the patient.   

## 2019-03-05 ENCOUNTER — Other Ambulatory Visit: Payer: Self-pay

## 2019-03-05 ENCOUNTER — Telehealth: Payer: Self-pay | Admitting: *Deleted

## 2019-03-05 DIAGNOSIS — E1140 Type 2 diabetes mellitus with diabetic neuropathy, unspecified: Secondary | ICD-10-CM

## 2019-03-05 DIAGNOSIS — Z760 Encounter for issue of repeat prescription: Secondary | ICD-10-CM

## 2019-03-05 DIAGNOSIS — IMO0002 Reserved for concepts with insufficient information to code with codable children: Secondary | ICD-10-CM

## 2019-03-05 MED ORDER — ACCU-CHEK GUIDE ME W/DEVICE KIT: 1.0000 | PACK | Freq: Three times a day (TID) | 0 refills | Status: DC

## 2019-03-05 MED ORDER — ACCU-CHEK GUIDE VI STRP: ORAL_STRIP | 1 refills | Status: DC

## 2019-03-05 MED ORDER — ONETOUCH VERIO VI STRP: ORAL_STRIP | 7 refills | Status: DC

## 2019-03-05 NOTE — Telephone Encounter (Signed)
Copied from [place] [phone]. Topic: General - Other >> Mar 04, 2019  5:32 PM Yvette Rack wrote: Reason for CRM: Diane with [facility] stated pt is in urgent need of a glucose meter as he has already been sent to hospital on Sunday for low sugar. Diane requests that pt receives a Rx for new meter asap. Cb# [phone]

## 2019-03-05 NOTE — Telephone Encounter (Signed)
Rx called in to pharmacy. 

## 2019-03-06 ENCOUNTER — Other Ambulatory Visit: Payer: Self-pay

## 2019-03-06 ENCOUNTER — Telehealth: Payer: Self-pay | Admitting: Adult Health

## 2019-03-06 MED ORDER — ACCU-CHEK GUIDE ME W/DEVICE KIT: 1.0000 | PACK | Freq: Three times a day (TID) | 0 refills | Status: DC

## 2019-03-06 NOTE — Telephone Encounter (Signed)
Patient's daughter is requesting a new glucose meter because EMS told her that his wasn't reading correctly.    [employer]  Pharmacy: [facility] in [place]

## 2019-03-06 NOTE — Progress Notes (Signed)
Virtual Visit via Telephone Note   This visit type was conducted due to national recommendations for restrictions regarding the COVID-19 Pandemic (e.g. social distancing) in an effort to limit this patient's exposure and mitigate transmission in our community.  Due to his co-morbid illnesses, this patient is at least at moderate risk for complications without adequate follow up.  This format is felt to be most appropriate for this patient at this time.  The patient did not have access to video technology/had technical difficulties with video requiring transitioning to audio format only (telephone).  All issues noted in this document were discussed and addressed.  No physical exam could be performed with this format.  Please refer to the patient's chart for his  consent to telehealth for [facility].   Date:  03/07/2019   ID:  Brandon, Mathis [birth year], MRN [id number]  Patient Location: Home Provider Location: Home  PCP:  [name], NP  Cardiologist:  Mertie Moores, MD   Electrophysiologist:  None   Evaluation Performed:  Follow-Up Visit  Chief Complaint:  Hospitalization Follow-up    History of Present Illness:    [name] is a 74 y.o. male with   Coronary artery disease   Aortic stenosis   Ascending aortic aneurysm   S/p CABG + bioprosthetic AVR + ascending aorta replacement in 2017  C/b post op AFib; intractable nausea and vomiting (readmitted several times >> Amio DC'd)  Diabetes mellitus   Hypertension   Hyperlipidemia  Hx of COVID-19 c/b pneumonia in 02/2019  He was admitted in 02/2019 with COVID-19 pneumonia and was managed with Remdesivir and Dexamethasone.  Of note, he was treated with IV Lasix due to volume excess in the hospital.  He went to the ED on 03/02/2019 with complaints of dizziness and palpitations.  His glucose was noted to be low.  His ECG was personally reviewed and did not demonstrate any acute changes.  A brain MRI was negative for  acute infarct.  He was given meclizine to use as needed.    Today, he notes that he is slowly getting better.  He has good days and bad days.  His breathing is improved but he still gets short of breath at times.  He is not using oxygen.  He has not had orthopnea, paroxysmal nocturnal dyspnea, leg swelling.  He does note episodes of palpitations.  These can be rapid at times.  He does note it with activity.  He has also had chest discomfort.  This has been ongoing since he was discharged from the hospital with COVID-19.  His palpitations and chest discomfort continue to improve.  At one point, his chest discomfort had a pleuritic component.  He has not really had any exertional chest discomfort or radiating symptoms or associated nausea or diaphoresis.  He has not used nitroglycerin.  He has not had syncope but does note feelings of near syncope at times with his palpitations.  Past Medical History:  Diagnosis Date  . 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 Carpal tunnel syndrome 09/24/2014   Bilateral  . 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 COVID-19   . Diabetes mellitus, type 2 ([place])   . Erectile dysfunction   . Essential hypertension   . Hyperlipidemia      Current Meds  Medication Sig  . albuterol (VENTOLIN HFA) 108 (90 Base) MCG/ACT inhaler Inhale 1-2 puffs into the lungs 2 (  two) times daily as needed for wheezing or shortness of breath.  [name] Kitchen ascorbic acid (VITAMIN C) 500 MG tablet Take 1 tablet (500 mg total) by mouth daily.  [name] Kitchen aspirin EC 81 MG tablet Take 81 mg by mouth daily.  . B-D ULTRAFINE III SHORT PEN 31G X 8 MM MISC USE 4 TIMES DAILY AS DIRECTED  . Blood Glucose Monitoring Suppl (ACCU-CHEK GUIDE ME) w/Device KIT 1 Stick by Does not apply route 3 (three) times daily. Used to check blood sugar 3 times daily  . cholecalciferol (VITAMIN D) 25 MCG tablet Take 1 tablet (1,000 Units total) by mouth daily.  . famotidine  (PEPCID) 20 MG tablet Take 1 tablet (20 mg total) by mouth 2 (two) times daily.  . fluticasone (FLONASE) 50 MCG/ACT nasal spray Place 2 sprays into both nostrils daily for 3 days.  [name] Kitchen glipiZIDE (GLUCOTROL XL) 10 MG 24 hr tablet TAKE 1 TABLET (10 MG TOTAL) BY MOUTH DAILY WITH BREAKFAST.  [name] Kitchen glucose blood (ACCU-CHEK GUIDE) test strip Use as instructed  . hydrochlorothiazide (HYDRODIURIL) 25 MG tablet Take 1 tablet (25 mg total) by mouth daily.  . Insulin Glargine ([name] KWIKPEN) 100 UNIT/ML SOPN INJECT 0.23 MLS (23 UNITS TOTAL) INTO THE SKIN AT BEDTIME.  . meclizine (ANTIVERT) 25 MG tablet Take 1 tablet (25 mg total) by mouth 3 (three) times daily as needed for up to 15 doses for dizziness.  . metFORMIN (GLUCOPHAGE) 1000 MG tablet TAKE 1 TABLET (1,000 MG TOTAL) BY MOUTH 2 (TWO) TIMES DAILY WITH A MEAL.  . metoprolol tartrate (LOPRESSOR) 25 MG tablet Take 0.5 tablets (12.5 mg total) by mouth 2 (two) times daily.  . ondansetron (ZOFRAN) 4 MG tablet Take 1 tablet (4 mg total) by mouth every 8 (eight) hours as needed for nausea or vomiting.  . rosuvastatin (CRESTOR) 20 MG tablet TAKE 1 TABLET BY MOUTH EVERY DAY  . zinc sulfate 220 (50 Zn) MG capsule Take 1 capsule (220 mg total) by mouth daily.     Allergies:   Amiodarone and Quinolones   Social History   Tobacco Use  . Smoking status: Never Smoker  . Smokeless tobacco: Never Used  Substance Use Topics  . Alcohol use: Yes    Alcohol/week: 1.0 standard drinks    Types: 1 Glasses of wine per week    Comment: 1 x month  . Drug use: No     Family Hx: The patient's family history includes Cancer in his father; Diabetes in his sister.  ROS:   Please see the history of present illness.      Prior CV studies:   The following studies were reviewed today:   Echocardiogram 02/18/2019 EF 50-55, mod LVH, Gr 1 DD, normally functioning AVR, mean AoV gradient 6  CT Chest and Aorta 11/07/2018 IMPRESSION: 1. Postoperative changes of Bentall  procedure, without acute complicating features. 2. Ectasia of proximal aortic arch measuring 4.2 cm in diameter, similar to the prior examination. 3. Aortic atherosclerosis, in addition to left main and 3 vessel coronary artery disease. Status post median sternotomy for CABG including LIMA to the LAD. Aortic Atherosclerosis (ICD10-I70.0).   Cardiac catheterization 10/27/15  Ost RCA to Prox RCA lesion, 40 %stenosed -followed by aneurysmal segment. Then Mid RCA lesion, 90 %stenosed.  Ost LAD to Prox LAD lesion, aneurysmal segment. Followed by. Mid LAD to Dist LAD lesion, 90 %stenosed.  Prox Cx lesion, 70 %stenosed. Mid Cx lesion, 70 %stenosed prior to OM 2. Mid Cx to Dist Cx lesion, 75 %stenosed  after OM 2  1st Mrg-1 lesion, 45 %stenosed. 1st Mrg-2 lesion, 70 %stenosed.  The left ventricular systolic function is low normal.  LV end diastolic pressure is normal. No signs of heart failure  The left ventricular ejection fraction is 50-55% by visual estimate.  There is moderate aortic valve stenosis. There is moderate (3+) aortic regurgitation. Multivessel coronary disease including severe mid LAD and mid RCA lesions as well as diffuse moderate to severe lesions in circumflex and OM 1 lesions and a codominant circumflex. This is in the setting of moderate aortic stenosis and ascending aortic aneurysmal dilation of 4.7 cm.   Pre CABG Dopplers 10/28/15 Summary: Findings suggest 1-39% internal carotid artery stenosis bilaterally. Vertebral arteries are patent with antegrade flow. Bilateral ABIs are within normal limits.   Labs/Other Tests and Data Reviewed:    EKG:  An ECG dated 02/18/2019 was personally reviewed today and demonstrated:  Normal sinus rhythm, heart rate 95, normal axis, septal Q waves, no significant ST-T wave changes, QTC 457  Recent Labs: 02/16/2019: B Natriuretic Peptide 427.2; Magnesium 2.6 03/02/2019: ALT 29; BUN 17; Creatinine, Ser 1.16; Hemoglobin 14.2; Platelets  330; Potassium 3.7; Sodium 136   Recent Lipid Panel Lab Results  Component Value Date/Time   CHOL 193 11/08/2018 03:29 PM   TRIG 160 (H) 02/15/2019 03:00 PM   HDL 98 11/08/2018 03:29 PM   CHOLHDL 2.0 11/08/2018 03:29 PM   CHOLHDL 2 04/24/2017 01:55 PM   LDLCALC 81 11/08/2018 03:29 PM   LDLDIRECT 112.6 05/14/2012 10:31 AM    Wt Readings from Last 3 Encounters:  03/07/19 174 lb (78.9 kg)  02/23/19 176 lb 2.4 oz (79.9 kg)  02/08/19 194 lb (88 kg)     Objective:    Vital Signs:  BP 135/79   Ht 6' (1.829 m)   Wt 174 lb (78.9 kg)   BMI 23.60 kg/m    VITAL SIGNS:  reviewed GEN:  no acute distress RESPIRATORY:  No labored breathing noted on the telephone NEURO:  Alert and oriented PSYCH:  Normal mood  ASSESSMENT & PLAN:    1. Coronary artery disease involving native coronary artery of native heart without angina pectoris History of CABG in 2017.  He has had some chest discomfort since he started to recover from COVID-19.  I reviewed his chest x-ray from January 17 and this demonstrated patchy bilateral airspace opacities.  I suspect his chest discomfort is mainly related to COVID-19 lung injury.  His symptoms seem to be improving.  He did not have any EKG changes in the hospital and his high-sensitivity troponins were normal.  At this point, I do not recommend any further testing.  If his chest symptoms should continue to persist or worsen over time, we will need to consider stress testing.  Continue current therapy which includes aspirin, metoprolol tartrate and rosuvastatin.  2. Nonrheumatic aortic valve stenosis 3. S/P AVR (aortic valve replacement) Status post bioprosthetic AVR at the time of his bypass surgery in 2017.  Recent echocardiogram in the hospital demonstrated normally functioning aortic valve replacement and normal LV function.  Continue SBE prophylaxis.  4. Thoracic aortic aneurysm without rupture ([place]) 5. Hx of ascending aorta replacement He had replacement of  his ascending thoracic aorta at the time of his CABG and AVR in 2017.  He is followed by Dr. Servando Snare with [facility].  6. Essential hypertension The patient's blood pressure is controlled on his current regimen.  Continue current therapy.   7. History of COVID-19 He had  significant disease but did not require intubation.  He seems to be slowly recovering at home.  Follow-up with primary care.  8. Palpitations 9. NSVT (nonsustained ventricular tachycardia) ([place]) He does note episodes of palpitations.  These also seem to be improving as he recovers from COVID-19.  I reviewed his chart and he did have one episode of nonsustained ventricular tachycardia.  As his EF is normal, he is at low risk for sudden cardiac death.  However, since he did have nonsustained ventricular tachycardia in the hospital, I recommend that we proceed with a 7-day ZIO patch to rule out significant arrhythmia.  -Arrange 7-day ZIO monitor  Time:   Today, I have spent 17 minutes with the patient with telehealth technology discussing the above problems.     Medication Adjustments/Labs and Tests Ordered: Current medicines are reviewed at length with the patient today.  Concerns regarding medicines are outlined above.   Tests Ordered: Orders Placed This Encounter  Procedures  . LONG TERM MONITOR (3-14 DAYS)    Medication Changes: No orders of the defined types were placed in this encounter.   Follow Up:  Either In Person or Virtual in 6-8 week(s)  Signed, Richardson Dopp, PA-C  03/07/2019 10:28 AM    [facility]

## 2019-03-07 ENCOUNTER — Telehealth: Payer: Self-pay | Admitting: Adult Health

## 2019-03-07 ENCOUNTER — Other Ambulatory Visit: Payer: Self-pay

## 2019-03-07 ENCOUNTER — Telehealth: Payer: Self-pay | Admitting: Radiology

## 2019-03-07 ENCOUNTER — Telehealth (INDEPENDENT_AMBULATORY_CARE_PROVIDER_SITE_OTHER): Payer: Medicare PPO | Admitting: Physician Assistant

## 2019-03-07 VITALS — BP 135/79 | Ht 72.0 in | Wt 174.0 lb

## 2019-03-07 DIAGNOSIS — I4729 Other ventricular tachycardia: Secondary | ICD-10-CM

## 2019-03-07 DIAGNOSIS — R002 Palpitations: Secondary | ICD-10-CM

## 2019-03-07 DIAGNOSIS — Z952 Presence of prosthetic heart valve: Secondary | ICD-10-CM

## 2019-03-07 DIAGNOSIS — I712 Thoracic aortic aneurysm, without rupture, unspecified: Secondary | ICD-10-CM

## 2019-03-07 DIAGNOSIS — I350 Nonrheumatic aortic (valve) stenosis: Secondary | ICD-10-CM

## 2019-03-07 DIAGNOSIS — I2510 Atherosclerotic heart disease of native coronary artery without angina pectoris: Secondary | ICD-10-CM | POA: Diagnosis not present

## 2019-03-07 DIAGNOSIS — Z95828 Presence of other vascular implants and grafts: Secondary | ICD-10-CM

## 2019-03-07 DIAGNOSIS — I10 Essential (primary) hypertension: Secondary | ICD-10-CM

## 2019-03-07 DIAGNOSIS — I472 Ventricular tachycardia: Secondary | ICD-10-CM

## 2019-03-07 DIAGNOSIS — Z8616 Personal history of COVID-19: Secondary | ICD-10-CM

## 2019-03-07 NOTE — Telephone Encounter (Signed)
Tried to call pt daughter but the phone was provided was disconnected. I called pt and he stated he will have her call our office. Call pt daughter to advise that the meter requested was already sent in yesterday.

## 2019-03-07 NOTE — Telephone Encounter (Signed)
Pt's daughter  Yetta Flock called in and stated that the Glucose meter did not match the patient test strips. He had a One Touch Verio, which was incorrect.  He needs an RX  for his glucose blood (ACCU-CHEK GUIDE) test strip  100  And a Blood Glucose Monitoring Suppl (ACCU-CHEK GUIDE ME) w/Device KIT [id number] [place] [facility] [address] Phone: [phone]:  [phone] the patient  Daughter AYetta Flockwould like a call at [phone] this is called in

## 2019-03-07 NOTE — Telephone Encounter (Signed)
Spoke to pharmacist and they stated they did not received the new meter information. Verbal order was given per request of the pharmacist. Pt notified of update. No further action needed!

## 2019-03-07 NOTE — Telephone Encounter (Signed)
Enrolled patient for a 7 day Zio monitor to be mailed to patients home.  

## 2019-03-07 NOTE — Telephone Encounter (Signed)
Pt's daughter  Yetta Flock called in and stated that the Glucose meter did not match the patient test strips. He had a One Touch Verio, which was incorrect.  He needs an RX  for his glucose blood (ACCU-CHEK GUIDE) test strip  100  And a Blood Glucose Monitoring Suppl (ACCU-CHEK GUIDE ME) w/Device KIT [id number] [place] [facility] [address]       Phone: [phone]:     [phone] the patient  Daughter AYetta Flockwould like a call at [phone] this is called in

## 2019-03-07 NOTE — Patient Instructions (Addendum)
Medication Instructions:   Your physician recommends that you continue on your current medications as directed. Please refer to the Current Medication list given to you today.  *If you need a refill on your cardiac medications before your next appointment, please call your pharmacy*  Lab Work:  None ordered today  If you have labs (blood work) drawn today and your tests are completely normal, you will receive your results only by: [name] Kitchen MyChart Message (if you have MyChart) OR . A paper copy in the mail If you have any lab test that is abnormal or we need to change your treatment, we will call you to review the results.  Testing/Procedures:  A zio monitor was ordered today. It will remain on for 7 days. You will then return monitor and event diary in provided box. It takes 1-2 weeks for report to be downloaded and returned to [place]. We will call you with the results. If monitor falls off or has orange flashing light, please call Zio for further instructions.    Follow-Up: At [facility], you and your health needs are our priority.  As part of our continuing mission to provide you with exceptional heart care, we have created designated Provider Care Teams.  These Care Teams include your primary Cardiologist (physician) and Advanced Practice Providers (APPs -  Physician Assistants and Nurse Practitioners) who all work together to provide you with the care you need, when you need it.  Your next appointment:    On 05/08/19 at 1:45PM with Richardson Dopp, PA-C

## 2019-03-11 ENCOUNTER — Ambulatory Visit (INDEPENDENT_AMBULATORY_CARE_PROVIDER_SITE_OTHER): Payer: Medicare PPO

## 2019-03-11 DIAGNOSIS — R002 Palpitations: Secondary | ICD-10-CM | POA: Diagnosis not present

## 2019-03-11 DIAGNOSIS — I4729 Other ventricular tachycardia: Secondary | ICD-10-CM

## 2019-03-11 DIAGNOSIS — I472 Ventricular tachycardia: Secondary | ICD-10-CM | POA: Diagnosis not present

## 2019-03-17 ENCOUNTER — Other Ambulatory Visit: Payer: Self-pay | Admitting: Adult Health

## 2019-03-27 ENCOUNTER — Telehealth: Payer: Self-pay | Admitting: Adult Health

## 2019-03-27 NOTE — Telephone Encounter (Signed)
Pt has had COVID in December and would like to know when he can get his 1st covid vaccine

## 2019-03-28 NOTE — Telephone Encounter (Signed)
Spoke to to pt and advised on how to set up for wait list. Tried to help put pt on wait list but pt did not have a email. Pt will ask daughter when she get home.

## 2019-03-28 NOTE — Telephone Encounter (Signed)
Pt called again asking when he can get the covid vaccine after having covid in dec.  Pt can be reached at [phone]

## 2019-04-02 ENCOUNTER — Encounter: Payer: Self-pay | Admitting: Physician Assistant

## 2019-04-03 ENCOUNTER — Telehealth: Payer: Self-pay

## 2019-04-03 MED ORDER — METOPROLOL TARTRATE 25 MG PO TABS: 25.0000 mg | ORAL_TABLET | Freq: Two times a day (BID) | ORAL | 1 refills | Status: DC

## 2019-04-03 NOTE — Telephone Encounter (Signed)
The patient has been notified of the result and verbalized understanding.  All questions (if any) were answered. New RX for Metoprolol sent to patients pharmacy. Mady Haagensen, [place] 04/03/2019 3:20 PM

## 2019-04-03 NOTE — Telephone Encounter (Signed)
-----   Message from Liliane Shi, [place] sent at 04/02/2019  1:42 PM EST ----- Please call the patient. Her monitor showed normal sinus rhythm.  There was no atrial fibrillation.  He did have rare fast beats from the top of his heart (SVT) and brief fast heart beats from the bottom of his heart (non-sustained VT).  These were all very brief.  He was experiencing palpitations at his last visit.  Therefore, I want him to increase his dose of beta-blocker.   PLAN:   - Increase Metoprolol tartrate to 25 mg twice daily  Richardson Dopp, PA-C    04/02/2019 1:35 PM

## 2019-04-07 ENCOUNTER — Other Ambulatory Visit: Payer: Self-pay | Admitting: Adult Health

## 2019-05-06 NOTE — Progress Notes (Deleted)
Cardiology Office Note:    Date:  05/06/2019   ID:  Cotey, Ham 1945/04/06, MRN [id number]  PCP:  [name], NP  Cardiologist:  Mertie Moores, MD *** Electrophysiologist:  None   Referring MD: [name], NP   Chief Complaint:  No chief complaint on file.    Patient Profile:    [name] is a 74 y.o. male with:  ***  Coronary artery disease   Aortic stenosis   Ascending aortic aneurysm  ? S/p CABG + bioprosthetic AVR + ascending aorta replacement in 2017 ? C/b post op AFib; intractable nausea and vomiting (readmitted several times >> Amio DC'd)  Diabetes mellitus   Hypertension   Hyperlipidemia  Hx of COVID-19 c/b pneumonia in 02/2019  Prior CV studies:  3-14 day LT Monitor 03/2019 Sinus rhythm; rare NSVT; rare nonsustained SVT; rare PVCs  Echocardiogram 02/18/2019 EF 50-55, mod LVH, Gr 1 DD, normally functioning AVR, mean AoV gradient 6  CT Chest and Aorta 11/07/2018 S/p Bentall; proximal aortic arch ectatic (4.2 cm); aortic atherosclerosis w/ LM and 3v CAD s/p median sternotomy and CABG w/ L-LAD  Cardiac catheterization 10/27/15 Multivessel coronary disease including severe mid LAD and mid RCA lesions as well as diffuse moderate to severe lesions in circumflex and OM 1 lesions and a codominant circumflex. This is in the setting of moderate aortic stenosis and ascending aortic aneurysmal dilation of 4.7 cm.   Pre CABG Dopplers 10/28/15 Bilateral ICA 1-39  History of Present Illness:    Mr. Thigpen was last seen via Telemedicine in 02/2019.  He was recovering from COVID-19 with associate pneumonia.  He complained of chest pain which seemed to be related to his recent illness and his symptoms were improving.  He also noted palpitations and I had him do a Zio patch monitor which showed rare NSVT and rare SVT.  I increased his beta-blocker dose.  The DICTATELATER SmartLink is not supported in this context. ***   Past Medical History:  Diagnosis  Date  . 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 Carpal tunnel syndrome 09/24/2014   Bilateral  . 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 COVID-19   . Diabetes mellitus, type 2 ([place])   . Erectile dysfunction   . Essential hypertension   . Hyperlipidemia   . Palpitations    Cardiac monitor 03/2019: normal sinus rhythm, no AF, rare NSVT, VT    Current Medications: No outpatient medications have been marked as taking for the 05/07/19 encounter (Appointment) with Richardson Dopp T, PA-C.     Allergies:   Amiodarone and Quinolones   Social History   Tobacco Use  . Smoking status: Never Smoker  . Smokeless tobacco: Never Used  Substance Use Topics  . Alcohol use: Yes    Alcohol/week: 1.0 standard drinks    Types: 1 Glasses of wine per week    Comment: 1 x month  . Drug use: No     Family Hx: The patient's family history includes Cancer in his father; Diabetes in his sister.  ROS   EKGs/Labs/Other Test Reviewed:    EKG:  EKG is *** ordered today.  The ekg ordered today demonstrates ***  Recent Labs: 02/16/2019: B Natriuretic Peptide 427.2; Magnesium 2.6 03/02/2019: ALT 29; BUN 17; Creatinine, Ser 1.16; Hemoglobin 14.2; Platelets 330; Potassium 3.7; Sodium 136   Recent Lipid Panel Lab Results  Component Value Date/Time   CHOL 193  11/08/2018 03:29 PM   TRIG 160 (H) 02/15/2019 03:00 PM   HDL 98 11/08/2018 03:29 PM   CHOLHDL 2.0 11/08/2018 03:29 PM   CHOLHDL 2 04/24/2017 01:55 PM   LDLCALC 81 11/08/2018 03:29 PM   LDLDIRECT 112.6 05/14/2012 10:31 AM    Physical Exam:    VS:  There were no vitals taken for this visit.    Wt Readings from Last 3 Encounters:  03/07/19 174 lb (78.9 kg)  02/23/19 176 lb 2.4 oz (79.9 kg)  02/08/19 194 lb (88 kg)     Physical Exam ***  ASSESSMENT & PLAN:    *** 1. Coronary artery disease involving native coronary artery of native heart without angina pectoris History of CABG  in 2017.  He has had some chest discomfort since he started to recover from COVID-19.  I reviewed his chest x-ray from January 17 and this demonstrated patchy bilateral airspace opacities.  I suspect his chest discomfort is mainly related to COVID-19 lung injury.  His symptoms seem to be improving.  He did not have any EKG changes in the hospital and his high-sensitivity troponins were normal.  At this point, I do not recommend any further testing.  If his chest symptoms should continue to persist or worsen over time, we will need to consider stress testing.  Continue current therapy which includes aspirin, metoprolol tartrate and rosuvastatin.  2. Nonrheumatic aortic valve stenosis 3. S/P AVR (aortic valve replacement) Status post bioprosthetic AVR at the time of his bypass surgery in 2017.  Recent echocardiogram in the hospital demonstrated normally functioning aortic valve replacement and normal LV function.  Continue SBE prophylaxis.  4. Thoracic aortic aneurysm without rupture ([place]) 5. Hx of ascending aorta replacement He had replacement of his ascending thoracic aorta at the time of his CABG and AVR in 2017.  He is followed by Dr. Servando Snare with [facility].  6. Essential hypertension The patient's blood pressure is controlled on his current regimen.  Continue current therapy.   7. History of COVID-19 He had significant disease but did not require intubation.  He seems to be slowly recovering at home.  Follow-up with primary care.  8. Palpitations 9. NSVT (nonsustained ventricular tachycardia) ([place]) He does note episodes of palpitations.  These also seem to be improving as he recovers from COVID-19.  I reviewed his chart and he did have one episode of nonsustained ventricular tachycardia.  As his EF is normal, he is at low risk for sudden cardiac death.  However, since he did have nonsustained ventricular tachycardia in the hospital, I recommend that we proceed with a 7-day ZIO patch to rule out  significant arrhythmia.             -Arrange 7-day ZIO monitor  Dispo:  No follow-ups on file.   Medication Adjustments/Labs and Tests Ordered: Current medicines are reviewed at length with the patient today.  Concerns regarding medicines are outlined above.  Tests Ordered: No orders of the defined types were placed in this encounter.  Medication Changes: No orders of the defined types were placed in this encounter.   Signed, Richardson Dopp, PA-C  05/06/2019 5:15 PM    [facility] [place], [place], [place]  [address] Phone: [phone]; Fax: [phone]

## 2019-05-07 ENCOUNTER — Ambulatory Visit: Payer: Medicare PPO | Admitting: Physician Assistant

## 2019-07-13 ENCOUNTER — Other Ambulatory Visit: Payer: Self-pay | Admitting: Adult Health

## 2019-07-16 NOTE — Telephone Encounter (Signed)
I HAVE SCHEDULED THE PT FOR FOLLOW UP.  SENT FOR 30 DAYS.

## 2019-07-21 ENCOUNTER — Other Ambulatory Visit: Payer: Self-pay

## 2019-07-22 ENCOUNTER — Ambulatory Visit: Payer: Medicare PPO | Admitting: Adult Health

## 2019-07-23 ENCOUNTER — Other Ambulatory Visit: Payer: Self-pay | Admitting: Adult Health

## 2019-07-23 DIAGNOSIS — IMO0002 Reserved for concepts with insufficient information to code with codable children: Secondary | ICD-10-CM

## 2019-07-24 NOTE — Telephone Encounter (Signed)
Message routed to PCP CMA  

## 2019-07-25 ENCOUNTER — Telehealth: Payer: Self-pay | Admitting: Family Medicine

## 2019-07-25 MED ORDER — LANTUS SOLOSTAR 100 UNIT/ML ~~LOC~~ SOPN: 23.0000 [IU] | PEN_INJECTOR | Freq: Every day | SUBCUTANEOUS | 0 refills | Status: DC

## 2019-07-25 NOTE — Telephone Encounter (Signed)
6 ml sent to the pharmacy by e-scribe.

## 2019-07-25 NOTE — Telephone Encounter (Signed)
Pt notified that rx has changed.  Sent in new rx to the pharmacy.  Nothing further needed.

## 2019-07-25 NOTE — Addendum Note (Signed)
Addended by: Miles Costain T on: 07/25/2019 01:19 PM   Modules accepted: Orders

## 2019-07-25 NOTE — Telephone Encounter (Signed)
Ok to refill for 30 days  

## 2019-07-25 NOTE — Telephone Encounter (Signed)
Received notification from the pharmacy stating they had started a prior authorization through CoverMyMeds.  Notification from CoverMyMeds states that these medications are covered alternatives.   Please advise.   Lantus Solostar U-100 Insulin Levemir FlexTouch U-100 Insuln  Lantus U-100 Insulin (Soln) Toujeo SoloStar U-300 Insulin  Tresiba FlexTouch U-100  Toujeo Max U-300 SoloStar  Levemir U-100 Insulin  Ozempic 0.25 Mg Or 0.5 Mg(2 OR/3.0 Ml)   Trulicity 1.5 YL/6.9 Ml  Victoza 2-Pak QL  Victoza 3-Pak QL  Novolog Flexpen U-100 Insulin Tresiba U-100 Insulin

## 2019-07-25 NOTE — Telephone Encounter (Signed)
Ok for Lantus at same dose as Engineer, agricultural

## 2019-07-30 ENCOUNTER — Ambulatory Visit (INDEPENDENT_AMBULATORY_CARE_PROVIDER_SITE_OTHER): Payer: Medicare PPO | Admitting: Adult Health

## 2019-07-30 ENCOUNTER — Encounter: Payer: Self-pay | Admitting: Adult Health

## 2019-07-30 ENCOUNTER — Other Ambulatory Visit: Payer: Self-pay

## 2019-07-30 VITALS — BP 140/82 | Temp 97.3°F | Wt 186.0 lb

## 2019-07-30 DIAGNOSIS — Z794 Long term (current) use of insulin: Secondary | ICD-10-CM | POA: Diagnosis not present

## 2019-07-30 DIAGNOSIS — E1165 Type 2 diabetes mellitus with hyperglycemia: Secondary | ICD-10-CM | POA: Diagnosis not present

## 2019-07-30 DIAGNOSIS — E1140 Type 2 diabetes mellitus with diabetic neuropathy, unspecified: Secondary | ICD-10-CM

## 2019-07-30 DIAGNOSIS — IMO0002 Reserved for concepts with insufficient information to code with codable children: Secondary | ICD-10-CM

## 2019-07-30 LAB — POCT GLYCOSYLATED HEMOGLOBIN (HGB A1C): HbA1c, POC (controlled diabetic range): 8.7 % — AB (ref 0.0–7.0)

## 2019-07-30 MED ORDER — BD PEN NEEDLE SHORT U/F 31G X 8 MM MISC: 3 refills | Status: DC

## 2019-07-30 MED ORDER — GLIPIZIDE ER 10 MG PO TB24: 10.0000 mg | ORAL_TABLET | Freq: Every day | ORAL | 0 refills | Status: DC

## 2019-07-30 NOTE — Patient Instructions (Addendum)
It was great seeing you today   Your A1c was 8.7   Increase Lantus to 30 units nightly.   Please schedule your physical in 90 days.

## 2019-07-30 NOTE — Progress Notes (Signed)
Subjective:    Patient ID: ANTOLIN BELSITO, male    DOB: 08/18/1945, 74 y.o.   MRN: [id number]  HPI 74 year old male who  has a past medical history of Aortic stenosis, Carpal tunnel syndrome (09/24/2014), Coronary artery disease, COVID-19, Diabetes mellitus, type 2 ([place]), Erectile dysfunction, Essential hypertension, Hyperlipidemia, and Palpitations.  He presents to the office today for follow-up regarding diabetes mellitus.  He was last seen for this in December 2019 at which time his A1c was 8.4.,  At this time we increase his Lantus to 23  units and continued him on Metformin 1000 mg twice daily and glipizide 10 mg extended release daily.  Was then seen in the hospital in January 2021 for pneumonia at which time his A1c was 9.1.  He was on steroids at this time and he was advised to titrate his Lantus depending on blood sugar readings.  He has not followed up since.   He reports that he has been checking his blood sugars periodically, the lowest he can remember is 99 and the highest it has been is 150.    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   Carpal tunnel syndrome 09/24/2014   Bilateral   Coronary artery disease    Multivessel status post CABG September 2017   COVID-19    Diabetes mellitus, type 2 ([place])    Erectile dysfunction    Essential hypertension    Hyperlipidemia    Palpitations    Cardiac monitor 03/2019: normal sinus rhythm, no AF, rare NSVT, VT    Social History   Socioeconomic History   Marital status: Widowed    Spouse name: Not on file   Number of children: 5   Years of education: 10    Highest education level: 10th grade  Occupational History   Occupation: custodian    Comment: PT  Tobacco Use   Smoking status: Never Smoker   Smokeless tobacco: Never Used  Substance and Sexual Activity   Alcohol use: Yes    Alcohol/week: 1.0 standard drink     Types: 1 Glasses of wine per week    Comment: 1 x month   Drug use: No   Sexual activity: Yes  Other Topics Concern   Not on file  Social History Narrative   Widowed to [name] for 62 years; 2 daughters liver with him now   Works part time as Chiropodist   5 children; 1 son deceased   Never Smoked   Alcohol use- no   Caffeine use: occasional       Lost one child a few months ago    Lost his sister, then son and then nephew - all within 2 weeks apart   Son had a stroke at 59    Was planning to get married last month in Feb.    Social Determinants of Health   Financial Resource Strain:    Difficulty of Paying Living Expenses:   Food Insecurity:    Worried About Charity fundraiser in the Last Year:    Arboriculturist in the Last Year:   Transportation Needs:    Film/video editor (Medical):    Lack of Transportation (Non-Medical):   Physical Activity:    Days of Exercise per Week:    Minutes of Exercise per Session:   Stress:    Feeling of  Stress :   Social Connections:    Frequency of Communication with Friends and Family:    Frequency of Social Gatherings with Friends and Family:    Attends Religious Services:    Active Member of Clubs or Organizations:    Attends Archivist Meetings:    Marital Status:   Intimate Partner Violence:    Fear of Current or Ex-Partner:    Emotionally Abused:    Physically Abused:    Sexually Abused:     Past Surgical History:  Procedure Laterality Date   AORTIC VALVE REPLACEMENT N/A 10/29/2015   Procedure: AORTIC VALVE REPLACEMENT (AVR) WITH 23MM MAGNA EASE TISSUE VALVE.;  Surgeon: [name], MD;  Location: [facility];  Service: Open Heart Surgery;  Laterality: N/A;   CARDIAC CATHETERIZATION N/A 10/27/2015   Procedure: Left Heart Cath and Coronary Angiography;  Surgeon: [name], MD;  Location: [facility];  Service: Cardiovascular;  Laterality: N/A;   CORONARY  ARTERY BYPASS GRAFT N/A 10/29/2015   Procedure: CORONARY ARTERY BYPASS GRAFTING (CABG) x Four UTILIZING THE LEFT INTERNAL MAMMARY ARTERY AND ENDOSCOPICALLY HARVESTED RIGHT SAPEHENEOUS VEINS.;  Surgeon: [name], MD;  Location: [facility];  Service: Open Heart Surgery;  Laterality: N/A;   CYST REMOVAL HAND Right    ESOPHAGOGASTRODUODENOSCOPY N/A 12/02/2015   Procedure: ESOPHAGOGASTRODUODENOSCOPY (EGD);  Surgeon: [name], MD;  Location: [facility];  Service: Gastroenterology;  Laterality: N/A;   REPLACEMENT ASCENDING AORTA N/A 10/29/2015   Procedure: REPLACEMENT OF ASCENDING AORTA USING 34MM X 30CM WOVEN DOUBLE VELOUR VASCULAR GRAFT.;  Surgeon: [name], MD;  Location: [facility];  Service: Open Heart Surgery;  Laterality: N/A;   TEE WITHOUT CARDIOVERSION N/A 10/29/2015   Procedure: TRANSESOPHAGEAL ECHOCARDIOGRAM (TEE);  Surgeon: [name], MD;  Location: [facility];  Service: Open Heart Surgery;  Laterality: N/A;   UVULECTOMY N/A 12/10/2015   Procedure: UVULECTOMY;  Surgeon: [name], MD;  [facility]: [facility];  Service: ENT;  Laterality: N/A;    Family History  Problem Relation Age of Onset   Cancer Father        Throat cancer   Diabetes Sister     Allergies  Allergen Reactions   Amiodarone Nausea And Vomiting   Quinolones     Patient was warned about not using Cipro and similar antibiotics. Recent studies have raised concern that fluoroquinolone antibiotics could be associated with an increased risk of aortic aneurysm Fluoroquinolones have non-antimicrobial properties that might jeopardise the integrity of the extracellular matrix of the vascular wall In a  propensity score matched cohort study in [place], there was a 66% increased rate of aortic aneurysm or dissection associated with oral fluoroquinolone use, compared wit    Current Outpatient Medications on File Prior to Visit  Medication Sig Dispense Refill   albuterol (VENTOLIN HFA) 108 (90 Base)  MCG/ACT inhaler Inhale 1-2 puffs into the lungs 2 (two) times daily as needed for wheezing or shortness of breath. 8 g 0   ascorbic acid (VITAMIN C) 500 MG tablet Take 1 tablet (500 mg total) by mouth daily. 60 tablet 0   aspirin EC 81 MG tablet Take 81 mg by mouth daily.     Blood Glucose Monitoring Suppl (ACCU-CHEK GUIDE ME) w/Device KIT 1 Stick by Does not apply route 3 (three) times daily. Used to check blood sugar 3 times daily 1 kit 0   cholecalciferol (VITAMIN D) 25 MCG tablet Take 1 tablet (1,000 Units total) by mouth daily. [place]  tablet 0   famotidine (PEPCID) 20 MG tablet Take 1 tablet (20 mg total) by mouth 2 (two) times daily. 30 tablet 0   fluticasone (FLONASE) 50 MCG/ACT nasal spray Place 2 sprays into both nostrils daily for 3 days. 11.1 mL 0   glucose blood (ACCU-CHEK GUIDE) test strip Use as instructed 100 each 1   hydrochlorothiazide (HYDRODIURIL) 25 MG tablet Take 1 tablet (25 mg total) by mouth daily. 90 tablet 3   insulin glargine (LANTUS SOLOSTAR) 100 UNIT/ML Solostar Pen Inject 23 Units into the skin daily. 6 mL 0   meclizine (ANTIVERT) 25 MG tablet Take 1 tablet (25 mg total) by mouth 3 (three) times daily as needed for up to 15 doses for dizziness. 15 tablet 0   metFORMIN (GLUCOPHAGE) 1000 MG tablet TAKE 1 TABLET (1,000 MG TOTAL) BY MOUTH 2 (TWO) TIMES DAILY WITH A MEAL. 60 tablet 0   metoprolol tartrate (LOPRESSOR) 25 MG tablet Take 1 tablet (25 mg total) by mouth 2 (two) times daily. 180 tablet 1   ondansetron (ZOFRAN) 4 MG tablet Take 1 tablet (4 mg total) by mouth every 8 (eight) hours as needed for nausea or vomiting. 20 tablet 1   rosuvastatin (CRESTOR) 20 MG tablet TAKE 1 TABLET BY MOUTH EVERY DAY 90 tablet 3   zinc sulfate 220 (50 Zn) MG capsule Take 1 capsule (220 mg total) by mouth daily. 60 capsule 0   No current facility-administered medications on file prior to visit.    BP 140/82    Temp (!) 97.3 F (36.3 C)    Wt 186 lb (84.4 kg)    BMI 25.23  kg/m       Objective:   Physical Exam Vitals and nursing note reviewed.  Constitutional:      Appearance: Normal appearance.  Cardiovascular:     Rate and Rhythm: Normal rate and regular rhythm.     Pulses: Normal pulses.     Heart sounds: Normal heart sounds.  Pulmonary:     Effort: Pulmonary effort is normal.     Breath sounds: Normal breath sounds.  Musculoskeletal:        General: Normal range of motion.  Skin:    General: Skin is warm and dry.  Neurological:     General: No focal deficit present.     Mental Status: He is alert.  Psychiatric:        Mood and Affect: Mood normal.        Behavior: Behavior normal.        Thought Content: Thought content normal.        Judgment: Judgment normal.       Assessment & Plan:  1. Uncontrolled type 2 diabetes mellitus with diabetic neuropathy, with long-term current use of insulin (HCC)  - POCT A1C- 8.7. Not at goal. Will have him increase his insulin to 30 units QHS. Needs to work on diet and stay active. Follow up in 90 days for CPE  - glipiZIDE (GLUCOTROL XL) 10 MG 24 hr tablet; Take 1 tablet (10 mg total) by mouth daily with breakfast.  Dispense: 90 tablet; Refill: 0 - Insulin Pen Needle (B-D ULTRAFINE III SHORT PEN) 31G X 8 MM MISC; Use QHS  Dispense: 90 each; Refill: 3   [name], NP

## 2019-08-07 ENCOUNTER — Other Ambulatory Visit: Payer: Self-pay | Admitting: Adult Health

## 2019-09-19 ENCOUNTER — Encounter: Payer: Self-pay | Admitting: Adult Health

## 2019-09-19 ENCOUNTER — Other Ambulatory Visit: Payer: Self-pay

## 2019-09-19 ENCOUNTER — Ambulatory Visit: Payer: Medicare PPO | Admitting: Adult Health

## 2019-09-19 VITALS — BP 190/90 | Temp 98.5°F | Wt 189.0 lb

## 2019-09-19 DIAGNOSIS — W57XXXA Bitten or stung by nonvenomous insect and other nonvenomous arthropods, initial encounter: Secondary | ICD-10-CM | POA: Diagnosis not present

## 2019-09-19 DIAGNOSIS — I10 Essential (primary) hypertension: Secondary | ICD-10-CM

## 2019-09-19 DIAGNOSIS — S90861A Insect bite (nonvenomous), right foot, initial encounter: Secondary | ICD-10-CM

## 2019-09-19 MED ORDER — HYDROCHLOROTHIAZIDE 25 MG PO TABS: 25.0000 mg | ORAL_TABLET | Freq: Every day | ORAL | 0 refills | Status: DC

## 2019-09-19 NOTE — Patient Instructions (Signed)
I am going to refill your HCTZ   Monitor your blood pressure and follow up with me early next week.   Use Benadryl cream for the itching on your foot

## 2019-09-19 NOTE — Progress Notes (Signed)
Subjective:    Patient ID: [name], male    DOB: [birth year], 74 y.o.   MRN: [id number]  HPI 74 year old male who  has a past medical history of Aortic stenosis, Carpal tunnel syndrome (09/24/2014), Coronary artery disease, COVID-19, Diabetes mellitus, type 2 ([place]), Erectile dysfunction, Essential hypertension, Hyperlipidemia, and Palpitations.  He originally presented today to the office for an acute issue of right leg and foot swelling.  This happened approximately 3 days ago after he got bit by ants.  Reports that the swelling has gone down over the last few days but the skin is still itchy.  In  office today his blood pressure is 190/90 on multiple readings.  He does report that he did take his metoprolol this morning and has been taking it as directed.  He has not been checking his BP at home routinely.  His HCTZ has not been filled in some time.  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   Carpal tunnel syndrome 09/24/2014   Bilateral   Coronary artery disease    Multivessel status post CABG September 2017   COVID-19    Diabetes mellitus, type 2 ([place])    Erectile dysfunction    Essential hypertension    Hyperlipidemia    Palpitations    Cardiac monitor 03/2019: normal sinus rhythm, no AF, rare NSVT, VT    Social History   Socioeconomic History   Marital status: Widowed    Spouse name: Not on file   Number of children: 5   Years of education: 10    Highest education level: 10th grade  Occupational History   Occupation: custodian    Comment: PT  Tobacco Use   Smoking status: Never Smoker   Smokeless tobacco: Never Used  Substance and Sexual Activity   Alcohol use: Yes    Alcohol/week: 1.0 standard drink    Types: 1 Glasses of wine per week    Comment: 1 x month   Drug use: No    Sexual activity: Yes  Other Topics Concern   Not on file  Social History Narrative   Widowed to [name] for 84 years; 2 daughters liver with him now   Works part time as Chiropodist   5 children; 1 son deceased   Never Smoked   Alcohol use- no   Caffeine use: occasional       Lost one child a few months ago    Lost his sister, then son and then nephew - all within 2 weeks apart   Son had a stroke at 31    Was planning to get married last month in Feb.    Social Determinants of Health   Financial Resource Strain:    Difficulty of Paying Living Expenses:   Food Insecurity:    Worried About Charity fundraiser in the Last Year:    Arboriculturist in the Last Year:   Transportation Needs:    Film/video editor (Medical):    Lack of Transportation (Non-Medical):   Physical Activity:    Days of Exercise per Week:    Minutes of Exercise per Session:   Stress:    Feeling of Stress :   Social Connections:    Frequency of Communication with Friends and Family:    Frequency  of Social Gatherings with Friends and Family:    Attends Religious Services:    Active Member of Clubs or Organizations:    Attends Archivist Meetings:    Marital Status:   Intimate Partner Violence:    Fear of Current or Ex-Partner:    Emotionally Abused:    Physically Abused:    Sexually Abused:     Past Surgical History:  Procedure Laterality Date   AORTIC VALVE REPLACEMENT N/A 10/29/2015   Procedure: AORTIC VALVE REPLACEMENT (AVR) WITH 23MM MAGNA EASE TISSUE VALVE.;  Surgeon: [name], MD;  Location: [facility];  Service: Open Heart Surgery;  Laterality: N/A;   CARDIAC CATHETERIZATION N/A 10/27/2015   Procedure: Left Heart Cath and Coronary Angiography;  Surgeon: [name], MD;  Location: [facility];  Service: Cardiovascular;  Laterality: N/A;   CORONARY ARTERY BYPASS GRAFT N/A 10/29/2015   Procedure: CORONARY ARTERY BYPASS GRAFTING  (CABG) x Four UTILIZING THE LEFT INTERNAL MAMMARY ARTERY AND ENDOSCOPICALLY HARVESTED RIGHT SAPEHENEOUS VEINS.;  Surgeon: [name], MD;  Location: [facility];  Service: Open Heart Surgery;  Laterality: N/A;   CYST REMOVAL HAND Right    ESOPHAGOGASTRODUODENOSCOPY N/A 12/02/2015   Procedure: ESOPHAGOGASTRODUODENOSCOPY (EGD);  Surgeon: [name], MD;  Location: [facility];  Service: Gastroenterology;  Laterality: N/A;   REPLACEMENT ASCENDING AORTA N/A 10/29/2015   Procedure: REPLACEMENT OF ASCENDING AORTA USING 34MM X 30CM WOVEN DOUBLE VELOUR VASCULAR GRAFT.;  Surgeon: [name], MD;  Location: [facility];  Service: Open Heart Surgery;  Laterality: N/A;   TEE WITHOUT CARDIOVERSION N/A 10/29/2015   Procedure: TRANSESOPHAGEAL ECHOCARDIOGRAM (TEE);  Surgeon: [name], MD;  Location: [facility];  Service: Open Heart Surgery;  Laterality: N/A;   UVULECTOMY N/A 12/10/2015   Procedure: UVULECTOMY;  Surgeon: [name], MD;  Location: [facility];  Service: ENT;  Laterality: N/A;    Family History  Problem Relation Age of Onset   Cancer Father        Throat cancer   Diabetes Sister     Allergies  Allergen Reactions   Amiodarone Nausea And Vomiting   Quinolones     Patient was warned about not using Cipro and similar antibiotics. Recent studies have raised concern that fluoroquinolone antibiotics could be associated with an increased risk of aortic aneurysm Fluoroquinolones have non-antimicrobial properties that might jeopardise the integrity of the extracellular matrix of the vascular wall In a  propensity score matched cohort study in [place], there was a 66% increased rate of aortic aneurysm or dissection associated with oral fluoroquinolone use, compared wit    Current Outpatient Medications on File Prior to Visit  Medication Sig Dispense Refill   albuterol (VENTOLIN HFA) 108 (90 Base) MCG/ACT inhaler Inhale 1-2 puffs into the lungs 2 (two) times daily as needed  for wheezing or shortness of breath. 8 g 0   ascorbic acid (VITAMIN C) 500 MG tablet Take 1 tablet (500 mg total) by mouth daily. 60 tablet 0   aspirin EC 81 MG tablet Take 81 mg by mouth daily.     Blood Glucose Monitoring Suppl (ACCU-CHEK GUIDE ME) w/Device KIT 1 Stick by Does not apply route 3 (three) times daily. Used to check blood sugar 3 times daily 1 kit 0   cholecalciferol (VITAMIN D) 25 MCG tablet Take 1 tablet (1,000 Units total) by mouth daily. 60 tablet 0   famotidine (PEPCID) 20 MG tablet Take 1 tablet (20 mg total) by mouth 2 (two) times  daily. 30 tablet 0   fluticasone (FLONASE) 50 MCG/ACT nasal spray Place 2 sprays into both nostrils daily for 3 days. 11.1 mL 0   glipiZIDE (GLUCOTROL XL) 10 MG 24 hr tablet Take 1 tablet (10 mg total) by mouth daily with breakfast. 90 tablet 0   glucose blood (ACCU-CHEK GUIDE) test strip Use as instructed 100 each 1   hydrochlorothiazide (HYDRODIURIL) 25 MG tablet Take 1 tablet (25 mg total) by mouth daily. 90 tablet 3   insulin glargine (LANTUS SOLOSTAR) 100 UNIT/ML Solostar Pen Inject 23 Units into the skin daily. (Patient taking differently: Inject 30 Units into the skin at bedtime. ) 6 mL 0   Insulin Pen Needle (B-D ULTRAFINE III SHORT PEN) 31G X 8 MM MISC Use QHS 90 each 3   meclizine (ANTIVERT) 25 MG tablet Take 1 tablet (25 mg total) by mouth 3 (three) times daily as needed for up to 15 doses for dizziness. 15 tablet 0   metFORMIN (GLUCOPHAGE) 1000 MG tablet TAKE 1 TABLET (1,000 MG TOTAL) BY MOUTH 2 (TWO) TIMES DAILY WITH A MEAL. 180 tablet 0   metoprolol tartrate (LOPRESSOR) 25 MG tablet Take 1 tablet (25 mg total) by mouth 2 (two) times daily. 180 tablet 1   ondansetron (ZOFRAN) 4 MG tablet Take 1 tablet (4 mg total) by mouth every 8 (eight) hours as needed for nausea or vomiting. 20 tablet 1   rosuvastatin (CRESTOR) 20 MG tablet TAKE 1 TABLET BY MOUTH EVERY DAY 90 tablet 3   zinc sulfate 220 (50 Zn) MG capsule Take 1  capsule (220 mg total) by mouth daily. 60 capsule 0   No current facility-administered medications on file prior to visit.    BP (!) 190/90    Temp 98.5 F (36.9 C)    Wt 189 lb (85.7 kg)    BMI 25.63 kg/m       Objective:   Physical Exam Vitals and nursing note reviewed.  Constitutional:      Appearance: Normal appearance.  Cardiovascular:     Rate and Rhythm: Normal rate and regular rhythm.     Pulses: Normal pulses.     Heart sounds: Normal heart sounds.  Pulmonary:     Effort: Pulmonary effort is normal.     Breath sounds: Normal breath sounds.  Musculoskeletal:     Right lower leg: No edema.     Left lower leg: No edema.  Skin:    General: Skin is warm and dry.     Comments: Multiple small gaps noted throughout right foot.  Likely from insect bite.  No edema noted.  No redness or warmth present  Neurological:     General: No focal deficit present.     Mental Status: He is alert and oriented to person, place, and time.  Psychiatric:        Mood and Affect: Mood normal.        Behavior: Behavior normal.        Thought Content: Thought content normal.        Judgment: Judgment normal.       Assessment & Plan:  1. Essential hypertension -Refill HCTZ 25 mg x 30 days.  Advised to take this daily, continue metoprolol.  Monitor blood pressure over the weekend and follow-up early next week.  Go to the emergency room with any red flags - hydrochlorothiazide (HYDRODIURIL) 25 MG tablet; Take 1 tablet (25 mg total) by mouth daily.  Dispense: 30 tablet; Refill: 0  2. Insect  bite of right foot, initial encounter -Swelling due to possible allergy from insect bite.  No signs of cellulitis or edema at this time.  Advised that he can put Benadryl cream on his leg foot to help with the itching.   [name], NP

## 2019-09-22 ENCOUNTER — Encounter: Payer: Self-pay | Admitting: Adult Health

## 2019-09-22 ENCOUNTER — Ambulatory Visit: Payer: Medicare PPO | Admitting: Adult Health

## 2019-09-22 ENCOUNTER — Other Ambulatory Visit: Payer: Self-pay

## 2019-09-22 VITALS — BP 142/80 | Temp 98.4°F | Wt 192.0 lb

## 2019-09-22 DIAGNOSIS — E1165 Type 2 diabetes mellitus with hyperglycemia: Secondary | ICD-10-CM | POA: Diagnosis not present

## 2019-09-22 DIAGNOSIS — I10 Essential (primary) hypertension: Secondary | ICD-10-CM

## 2019-09-22 DIAGNOSIS — E1140 Type 2 diabetes mellitus with diabetic neuropathy, unspecified: Secondary | ICD-10-CM

## 2019-09-22 DIAGNOSIS — Z794 Long term (current) use of insulin: Secondary | ICD-10-CM

## 2019-09-22 DIAGNOSIS — IMO0002 Reserved for concepts with insufficient information to code with codable children: Secondary | ICD-10-CM

## 2019-09-22 NOTE — Progress Notes (Signed)
Subjective:    Patient ID: [name], male    DOB: 1945/11/19, 74 y.o.   MRN: [id number]  HPI 74 year old male who  has a past medical history of Aortic stenosis, Carpal tunnel syndrome (09/24/2014), Coronary artery disease, COVID-19, Diabetes mellitus, type 2 ([place]), Erectile dysfunction, Essential hypertension, Hyperlipidemia, and Palpitations.  He presents to the office today for one month follow up regarding hypertension. He was seen one week ago for an acute issue of right leg swelling after an insect bite.   At this time His BP was 190/90. He was taking Metoprolol BID.  It was thought that he was not taking hydrochlorothiazide and was advised to go home and restart this medication.  A 30-day prescription was sent to his local pharmacy.  He reports that when he got home he checked and found out that he actually was taking his hydrochlorothiazide this entire time.  He has been monitoring his blood pressures at home with readings between 125-143 over 70s.  He is wondering if we have a sample of Lantus to get him through until his next check in a month. He wants to make sure he does not run out.   Review of Systems See HPI   Past Medical History:  Diagnosis Date  . 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 Carpal tunnel syndrome 09/24/2014   Bilateral  . 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 COVID-19   . Diabetes mellitus, type 2 ([place])   . Erectile dysfunction   . Essential hypertension   . Hyperlipidemia   . Palpitations    Cardiac monitor 03/2019: normal sinus rhythm, no AF, rare NSVT, VT    Social History   Socioeconomic History  . Marital status: Widowed    Spouse name: Not on file  . Number of children: 5  . Years of education: 10   . Highest education level: 10th grade  Occupational History  . Occupation: custodian    Comment: PT  Tobacco Use  . Smoking status: Never Smoker  . Smokeless tobacco: Never  Used  Substance and Sexual Activity  . Alcohol use: Yes    Alcohol/week: 1.0 standard drink    Types: 1 Glasses of wine per week    Comment: 1 x month  . Drug use: No  . Sexual activity: Yes  Other Topics Concern  . Not on file  Social History Narrative   Widowed to [name] for 106 years; 2 daughters liver with him now   Works part time as Chiropodist   5 children; 1 son deceased   Never Smoked   Alcohol use- no   Caffeine use: occasional       Lost one child a few months ago    Lost his sister, then son and then nephew - all within 2 weeks apart   Son had a stroke at 31    Was planning to get married last month in Feb.    Social Determinants of Health   Financial Resource Strain:   . Difficulty of Paying Living Expenses:   Food Insecurity:   . Worried About Charity fundraiser in the Last Year:   . Arboriculturist in the Last Year:   Transportation Needs:   . Film/video editor (Medical):   [name] Kitchen Lack of Transportation (Non-Medical):   Physical Activity:   . Days of Exercise per Week:   .  Minutes of Exercise per Session:   Stress:   . Feeling of Stress :   Social Connections:   . Frequency of Communication with Friends and Family:   . Frequency of Social Gatherings with Friends and Family:   . Attends Religious Services:   . Active Member of Clubs or Organizations:   . Attends Archivist Meetings:   [name] Kitchen Marital Status:   Intimate Partner Violence:   . Fear of Current or Ex-Partner:   . Emotionally Abused:   [name] Kitchen Physically Abused:   . Sexually Abused:     Past Surgical History:  Procedure Laterality Date  . AORTIC VALVE REPLACEMENT N/A 10/29/2015   Procedure: AORTIC VALVE REPLACEMENT (AVR) WITH 23MM MAGNA EASE TISSUE VALVE.;  Surgeon: [name], MD;  Location: [facility];  Service: Open Heart Surgery;  Laterality: N/A;  . CARDIAC CATHETERIZATION N/A 10/27/2015   Procedure: Left Heart Cath and Coronary Angiography;  Surgeon: [name], MD;  Location: [facility];  Service: Cardiovascular;  Laterality: N/A;  . CORONARY ARTERY BYPASS GRAFT N/A 10/29/2015   Procedure: CORONARY ARTERY BYPASS GRAFTING (CABG) x Four UTILIZING THE LEFT INTERNAL MAMMARY ARTERY AND ENDOSCOPICALLY HARVESTED RIGHT SAPEHENEOUS VEINS.;  Surgeon: [name], MD;  Location: [facility];  Service: Open Heart Surgery;  Laterality: N/A;  . CYST REMOVAL HAND Right   . ESOPHAGOGASTRODUODENOSCOPY N/A 12/02/2015   Procedure: ESOPHAGOGASTRODUODENOSCOPY (EGD);  Surgeon: [name], MD;  Location: [facility];  Service: Gastroenterology;  Laterality: N/A;  . REPLACEMENT ASCENDING AORTA N/A 10/29/2015   Procedure: REPLACEMENT OF ASCENDING AORTA USING 34MM X 30CM WOVEN DOUBLE VELOUR VASCULAR GRAFT.;  Surgeon: [name], MD;  Location: [facility];  Service: Open Heart Surgery;  Laterality: N/A;  . TEE WITHOUT CARDIOVERSION N/A 10/29/2015   Procedure: TRANSESOPHAGEAL ECHOCARDIOGRAM (TEE);  Surgeon: [name], MD;  Location: [facility];  Service: Open Heart Surgery;  Laterality: N/A;  . UVULECTOMY N/A 12/10/2015   Procedure: UVULECTOMY;  Surgeon: [name], MD;  [facility];  Service: ENT;  Laterality: N/A;    Family History  Problem Relation Age of Onset  . Cancer Father        Throat cancer  . Diabetes Sister     Allergies  Allergen Reactions  . Amiodarone Nausea And Vomiting  . Quinolones     Patient was warned about not using Cipro and similar antibiotics. Recent studies have raised concern that fluoroquinolone antibiotics could be associated with an increased risk of aortic aneurysm Fluoroquinolones have non-antimicrobial properties that might jeopardise the integrity of the extracellular matrix of the vascular wall In a  propensity score matched cohort study in [place], there was a 66% increased rate of aortic aneurysm or dissection associated with oral fluoroquinolone use, compared wit    Current Outpatient  Medications on File Prior to Visit  Medication Sig Dispense Refill  . albuterol (VENTOLIN HFA) 108 (90 Base) MCG/ACT inhaler Inhale 1-2 puffs into the lungs 2 (two) times daily as needed for wheezing or shortness of breath. 8 g 0  . ascorbic acid (VITAMIN C) 500 MG tablet Take 1 tablet (500 mg total) by mouth daily. 60 tablet 0  . aspirin EC 81 MG tablet Take 81 mg by mouth daily.    . Blood Glucose Monitoring Suppl (ACCU-CHEK GUIDE ME) w/Device KIT 1 Stick by Does not apply route 3 (three) times daily. Used to check blood sugar 3 times daily 1 kit 0  . cholecalciferol (VITAMIN D)  25 MCG tablet Take 1 tablet (1,000 Units total) by mouth daily. 60 tablet 0  . famotidine (PEPCID) 20 MG tablet Take 1 tablet (20 mg total) by mouth 2 (two) times daily. 30 tablet 0  . fluticasone (FLONASE) 50 MCG/ACT nasal spray Place 2 sprays into both nostrils daily for 3 days. 11.1 mL 0  . glipiZIDE (GLUCOTROL XL) 10 MG 24 hr tablet Take 1 tablet (10 mg total) by mouth daily with breakfast. 90 tablet 0  . glucose blood (ACCU-CHEK GUIDE) test strip Use as instructed 100 each 1  . hydrochlorothiazide (HYDRODIURIL) 25 MG tablet Take 1 tablet (25 mg total) by mouth daily. 30 tablet 0  . insulin glargine (LANTUS SOLOSTAR) 100 UNIT/ML Solostar Pen Inject 23 Units into the skin daily. (Patient taking differently: Inject 30 Units into the skin at bedtime. ) 6 mL 0  . Insulin Pen Needle (B-D ULTRAFINE III SHORT PEN) 31G X 8 MM MISC Use QHS 90 each 3  . meclizine (ANTIVERT) 25 MG tablet Take 1 tablet (25 mg total) by mouth 3 (three) times daily as needed for up to 15 doses for dizziness. 15 tablet 0  . metFORMIN (GLUCOPHAGE) 1000 MG tablet TAKE 1 TABLET (1,000 MG TOTAL) BY MOUTH 2 (TWO) TIMES DAILY WITH A MEAL. 180 tablet 0  . metoprolol tartrate (LOPRESSOR) 25 MG tablet Take 1 tablet (25 mg total) by mouth 2 (two) times daily. 180 tablet 1  . ondansetron (ZOFRAN) 4 MG tablet Take 1 tablet (4 mg total) by mouth every 8 (eight)  hours as needed for nausea or vomiting. 20 tablet 1  . rosuvastatin (CRESTOR) 20 MG tablet TAKE 1 TABLET BY MOUTH EVERY DAY 90 tablet 3  . zinc sulfate 220 (50 Zn) MG capsule Take 1 capsule (220 mg total) by mouth daily. 60 capsule 0   No current facility-administered medications on file prior to visit.    BP (!) 142/80   Temp 98.4 F (36.9 C)   Wt 192 lb (87.1 kg)   BMI 26.04 kg/m       Objective:   Physical Exam Vitals and nursing note reviewed.  Constitutional:      Appearance: Normal appearance.  Cardiovascular:     Rate and Rhythm: Normal rate and regular rhythm.     Pulses: Normal pulses.  Pulmonary:     Effort: Pulmonary effort is normal.     Breath sounds: Normal breath sounds.  Musculoskeletal:        General: Normal range of motion.  Skin:    General: Skin is warm and dry.  Neurological:     General: No focal deficit present.     Mental Status: He is alert and oriented to person, place, and time.  Psychiatric:        Mood and Affect: Mood normal.        Behavior: Behavior normal.        Thought Content: Thought content normal.        Judgment: Judgment normal.        Assessment & Plan:  1. Essential hypertension - BP back to baseline. Not really sure what causes his spike.  - No change in medications   2. Uncontrolled type 2 diabetes mellitus with diabetic neuropathy, with long-term current use of insulin ([place]) - Samples of [name] were given to the patient, quantity 1, Lot Number [id number]  [name], NP

## 2019-11-06 ENCOUNTER — Other Ambulatory Visit: Payer: Self-pay | Admitting: Adult Health

## 2019-11-06 ENCOUNTER — Encounter: Payer: Self-pay | Admitting: Adult Health

## 2019-11-06 ENCOUNTER — Other Ambulatory Visit: Payer: Self-pay

## 2019-11-06 ENCOUNTER — Ambulatory Visit (INDEPENDENT_AMBULATORY_CARE_PROVIDER_SITE_OTHER): Payer: Medicare PPO | Admitting: Adult Health

## 2019-11-06 VITALS — BP 152/80 | HR 70 | Temp 98.0°F | Ht 72.0 in | Wt 188.0 lb

## 2019-11-06 DIAGNOSIS — I10 Essential (primary) hypertension: Secondary | ICD-10-CM | POA: Diagnosis not present

## 2019-11-06 DIAGNOSIS — I25810 Atherosclerosis of coronary artery bypass graft(s) without angina pectoris: Secondary | ICD-10-CM | POA: Diagnosis not present

## 2019-11-06 DIAGNOSIS — E1140 Type 2 diabetes mellitus with diabetic neuropathy, unspecified: Secondary | ICD-10-CM

## 2019-11-06 DIAGNOSIS — Z23 Encounter for immunization: Secondary | ICD-10-CM

## 2019-11-06 DIAGNOSIS — E1165 Type 2 diabetes mellitus with hyperglycemia: Secondary | ICD-10-CM

## 2019-11-06 DIAGNOSIS — Z125 Encounter for screening for malignant neoplasm of prostate: Secondary | ICD-10-CM

## 2019-11-06 DIAGNOSIS — IMO0002 Reserved for concepts with insufficient information to code with codable children: Secondary | ICD-10-CM

## 2019-11-06 DIAGNOSIS — Z0000 Encounter for general adult medical examination without abnormal findings: Secondary | ICD-10-CM

## 2019-11-06 DIAGNOSIS — Z794 Long term (current) use of insulin: Secondary | ICD-10-CM

## 2019-11-06 DIAGNOSIS — E782 Mixed hyperlipidemia: Secondary | ICD-10-CM

## 2019-11-06 MED ORDER — HYDROCHLOROTHIAZIDE 25 MG PO TABS: 25.0000 mg | ORAL_TABLET | Freq: Every day | ORAL | 3 refills | Status: DC

## 2019-11-06 MED ORDER — GLIPIZIDE ER 10 MG PO TB24: 10.0000 mg | ORAL_TABLET | Freq: Every day | ORAL | 0 refills | Status: DC

## 2019-11-06 MED ORDER — METFORMIN HCL 1000 MG PO TABS: 1000.0000 mg | ORAL_TABLET | Freq: Two times a day (BID) | ORAL | 0 refills | Status: DC

## 2019-11-06 NOTE — Patient Instructions (Signed)
We will follow up with you regarding your blood work   Please monitor your blood pressure twice a day for the next week and schedule a virtual follow up appointment for this time next week

## 2019-11-06 NOTE — Progress Notes (Signed)
Subjective:    Patient ID: [name], male    DOB: 29-Jan-1946, 74 y.o.   MRN: [id number]  HPI Patient presents for yearly preventative medicine examination. He is a pleasant 74 year old male who  has a past medical history of Aortic stenosis, Carpal tunnel syndrome (09/24/2014), Coronary artery disease, COVID-19, Diabetes mellitus, type 2 ([place]), Erectile dysfunction, Essential hypertension, Hyperlipidemia, and Palpitations.  DM -uncontrolled.  Currently prescribed glipizide 10 mg extended release, Metformin 1000 mg twice daily, and Lantus 30 units nightly. He has been checking his blood sugars at home and reports readings between 120-140. He denies episodes of hypoglycemia.  Not compliant with diabetic diet has always been an issue with [facility] and continues to be an issue. He tries to stay active   Lab Results  Component Value Date   HGBA1C 8.7 (A) 07/30/2019   Essential hypertension-currently prescribed hydrochlorothiazide 25 mg daily and metoprolol 25 mg twice daily.  He has checked his blood pressures at home periodically and has been getting readings between 121-150/70's. He does report an episode of 80's/70's recently and which time he felt dizzy.   BP Readings from Last 3 Encounters:  11/06/19 (!) 152/80  09/22/19 (!) 142/80  09/19/19 (!) 190/90   Hyperlipidemia/CAD-prescribed Crestor 20 mg daily and aspirin 81 mg daily.  He denies myalgia or fatigue, chest pain, palpitations. He is managed by Cardiology   GERD-well-controlled with Pepcid 20 mg twice daily  All immunizations and health maintenance protocols were reviewed with the patient and needed orders were placed. Would like flu vaccination today   Appropriate screening laboratory values were ordered for the patient including screening of hyperlipidemia, renal function and hepatic function. If indicated by BPH, a PSA was ordered.  Medication reconciliation,  past medical history, social history, problem list and allergies  were reviewed in detail with the patient  Goals were established with regard to weight loss, exercise, and  diet in compliance with medications  Wt Readings from Last 3 Encounters:  11/06/19 188 lb (85.3 kg)  09/22/19 192 lb (87.1 kg)  09/19/19 189 lb (85.7 kg)   End of life planning was discussed.  Review of Systems  Constitutional: Negative.   HENT: Negative.   Eyes: Negative.   Respiratory: Negative.   Cardiovascular: Negative.   Gastrointestinal: Negative.   Endocrine: Negative.   Genitourinary: Negative.   Musculoskeletal: Negative.   Skin: Negative.   Allergic/Immunologic: Negative.   Neurological: Negative.   Hematological: Negative.   Psychiatric/Behavioral: Negative.   All other systems reviewed and are negative.    Past Medical History:  Diagnosis Date  . 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 Carpal tunnel syndrome 09/24/2014   Bilateral  . 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 COVID-19   . Diabetes mellitus, type 2 ([place])   . Erectile dysfunction   . Essential hypertension   . Hyperlipidemia   . Palpitations    Cardiac monitor 03/2019: normal sinus rhythm, no AF, rare NSVT, VT    Social History   Socioeconomic History  . Marital status: Widowed    Spouse name: Not on file  . Number of children: 5  . Years of education: 10   . Highest education level: 10th grade  Occupational History  . Occupation: custodian    Comment: PT  Tobacco Use  . Smoking status: Never Smoker  . Smokeless tobacco: Never Used  Substance and Sexual Activity  .  Alcohol use: Yes    Alcohol/week: 1.0 standard drink    Types: 1 Glasses of wine per week    Comment: 1 x month  . Drug use: No  . Sexual activity: Yes  Other Topics Concern  . Not on file  Social History Narrative   Widowed to [name] for 94 years; 2 daughters liver with him now   Works part time as Chiropodist   5 children; 1 son  deceased   Never Smoked   Alcohol use- no   Caffeine use: occasional       Lost one child a few months ago    Lost his sister, then son and then nephew - all within 2 weeks apart   Son had a stroke at 70    Was planning to get married last month in Feb.    Social Determinants of Health   Financial Resource Strain:   . Difficulty of Paying Living Expenses: Not on file  Food Insecurity:   . Worried About Charity fundraiser in the Last Year: Not on file  . Ran Out of Food in the Last Year: Not on file  Transportation Needs:   . Lack of Transportation (Medical): Not on file  . Lack of Transportation (Non-Medical): Not on file  Physical Activity:   . Days of Exercise per Week: Not on file  . Minutes of Exercise per Session: Not on file  Stress:   . Feeling of Stress : Not on file  Social Connections:   . Frequency of Communication with Friends and Family: Not on file  . Frequency of Social Gatherings with Friends and Family: Not on file  . Attends Religious Services: Not on file  . Active Member of Clubs or Organizations: Not on file  . Attends Archivist Meetings: Not on file  . Marital Status: Not on file  Intimate Partner Violence:   . Fear of Current or Ex-Partner: Not on file  . Emotionally Abused: Not on file  . Physically Abused: Not on file  . Sexually Abused: Not on file    Past Surgical History:  Procedure Laterality Date  . AORTIC VALVE REPLACEMENT N/A 10/29/2015   Procedure: AORTIC VALVE REPLACEMENT (AVR) WITH 23MM MAGNA EASE TISSUE VALVE.;  Surgeon: [name], MD;  Location: [facility];  Service: Open Heart Surgery;  Laterality: N/A;  . CARDIAC CATHETERIZATION N/A 10/27/2015   Procedure: Left Heart Cath and Coronary Angiography;  Surgeon: [name], MD;  Location: [facility];  Service: Cardiovascular;  Laterality: N/A;  . CORONARY ARTERY BYPASS GRAFT N/A 10/29/2015   Procedure: CORONARY ARTERY BYPASS GRAFTING (CABG) x Four UTILIZING THE  LEFT INTERNAL MAMMARY ARTERY AND ENDOSCOPICALLY HARVESTED RIGHT SAPEHENEOUS VEINS.;  Surgeon: [name], MD;  Location: [facility];  Service: Open Heart Surgery;  Laterality: N/A;  . CYST REMOVAL HAND Right   . ESOPHAGOGASTRODUODENOSCOPY N/A 12/02/2015   Procedure: ESOPHAGOGASTRODUODENOSCOPY (EGD);  Surgeon: [name], MD;  Location: [facility];  Service: Gastroenterology;  Laterality: N/A;  . REPLACEMENT ASCENDING AORTA N/A 10/29/2015   Procedure: REPLACEMENT OF ASCENDING AORTA USING 34MM X 30CM WOVEN DOUBLE VELOUR VASCULAR GRAFT.;  Surgeon: [name], MD;  Location: [facility];  Service: Open Heart Surgery;  Laterality: N/A;  . TEE WITHOUT CARDIOVERSION N/A 10/29/2015   Procedure: TRANSESOPHAGEAL ECHOCARDIOGRAM (TEE);  Surgeon: [name], MD;  Location: [facility];  Service: Open Heart Surgery;  Laterality: N/A;  . UVULECTOMY N/A 12/10/2015  Procedure: UVULECTOMY;  Surgeon: [name], MD;  Location: [facility];  Service: ENT;  Laterality: N/A;    Family History  Problem Relation Age of Onset  . Cancer Father        Throat cancer  . Diabetes Sister     Allergies  Allergen Reactions  . Amiodarone Nausea And Vomiting  . Quinolones     Patient was warned about not using Cipro and similar antibiotics. Recent studies have raised concern that fluoroquinolone antibiotics could be associated with an increased risk of aortic aneurysm Fluoroquinolones have non-antimicrobial properties that might jeopardise the integrity of the extracellular matrix of the vascular wall In a  propensity score matched cohort study in [place], there was a 66% increased rate of aortic aneurysm or dissection associated with oral fluoroquinolone use, compared wit    Current Outpatient Medications on File Prior to Visit  Medication Sig Dispense Refill  . albuterol (VENTOLIN HFA) 108 (90 Base) MCG/ACT inhaler Inhale 1-2 puffs into the lungs 2 (two) times daily as needed for wheezing or shortness of  breath. 8 g 0  . ascorbic acid (VITAMIN C) 500 MG tablet Take 1 tablet (500 mg total) by mouth daily. 60 tablet 0  . aspirin EC 81 MG tablet Take 81 mg by mouth daily.    . Blood Glucose Monitoring Suppl (ACCU-CHEK GUIDE ME) w/Device KIT 1 Stick by Does not apply route 3 (three) times daily. Used to check blood sugar 3 times daily 1 kit 0  . cholecalciferol (VITAMIN D) 25 MCG tablet Take 1 tablet (1,000 Units total) by mouth daily. 60 tablet 0  . famotidine (PEPCID) 20 MG tablet Take 1 tablet (20 mg total) by mouth 2 (two) times daily. 30 tablet 0  . fluticasone (FLONASE) 50 MCG/ACT nasal spray Place 2 sprays into both nostrils daily for 3 days. 11.1 mL 0  . glipiZIDE (GLUCOTROL XL) 10 MG 24 hr tablet Take 1 tablet (10 mg total) by mouth daily with breakfast. 90 tablet 0  . glucose blood (ACCU-CHEK GUIDE) test strip Use as instructed 100 each 1  . hydrochlorothiazide (HYDRODIURIL) 25 MG tablet Take 1 tablet (25 mg total) by mouth daily. 30 tablet 0  . insulin glargine (LANTUS SOLOSTAR) 100 UNIT/ML Solostar Pen Inject 23 Units into the skin daily. (Patient taking differently: Inject 30 Units into the skin at bedtime. ) 6 mL 0  . Insulin Pen Needle (B-D ULTRAFINE III SHORT PEN) 31G X 8 MM MISC Use QHS 90 each 3  . meclizine (ANTIVERT) 25 MG tablet Take 1 tablet (25 mg total) by mouth 3 (three) times daily as needed for up to 15 doses for dizziness. 15 tablet 0  . metFORMIN (GLUCOPHAGE) 1000 MG tablet TAKE 1 TABLET (1,000 MG TOTAL) BY MOUTH 2 (TWO) TIMES DAILY WITH A MEAL. 180 tablet 0  . metoprolol tartrate (LOPRESSOR) 25 MG tablet Take 1 tablet (25 mg total) by mouth 2 (two) times daily. 180 tablet 1  . ondansetron (ZOFRAN) 4 MG tablet Take 1 tablet (4 mg total) by mouth every 8 (eight) hours as needed for nausea or vomiting. 20 tablet 1  . rosuvastatin (CRESTOR) 20 MG tablet TAKE 1 TABLET BY MOUTH EVERY DAY 90 tablet 3  . zinc sulfate 220 (50 Zn) MG capsule Take 1 capsule (220 mg total) by mouth  daily. 60 capsule 0   No current facility-administered medications on file prior to visit.    BP (!) 152/80 (BP Location: Left Arm, Patient Position: Sitting, Cuff Size:  Normal)   Pulse 70   Temp 98 F (36.7 C)   Ht 6' (1.829 m)   Wt 188 lb (85.3 kg)   SpO2 99%   BMI 25.50 kg/m       Objective:   Physical Exam Vitals and nursing note reviewed.  Constitutional:      General: He is not in acute distress.    Appearance: Normal appearance. He is well-developed and normal weight.  HENT:     Head: Normocephalic and atraumatic.     Right Ear: Tympanic membrane, ear canal and external ear normal. There is no impacted cerumen.     Left Ear: Tympanic membrane, ear canal and external ear normal. There is no impacted cerumen.     Nose: Nose normal. No congestion or rhinorrhea.     Mouth/Throat:     Mouth: Mucous membranes are moist.     Pharynx: Oropharynx is clear. No oropharyngeal exudate or posterior oropharyngeal erythema.  Eyes:     General:        Right eye: No discharge.        Left eye: No discharge.     Extraocular Movements: Extraocular movements intact.     Conjunctiva/sclera: Conjunctivae normal.     Pupils: Pupils are equal, round, and reactive to light.  Neck:     Vascular: No carotid bruit.     Trachea: No tracheal deviation.  Cardiovascular:     Rate and Rhythm: Normal rate and regular rhythm.     Pulses: Normal pulses.     Heart sounds: Normal heart sounds. No murmur heard.  No friction rub. No gallop.   Pulmonary:     Effort: Pulmonary effort is normal. No respiratory distress.     Breath sounds: Normal breath sounds. No stridor. No wheezing, rhonchi or rales.  Chest:     Chest wall: No tenderness.  Abdominal:     General: Bowel sounds are normal. There is no distension.     Palpations: Abdomen is soft. There is no mass.     Tenderness: There is no abdominal tenderness. There is no right CVA tenderness, left CVA tenderness, guarding or rebound.     Hernia:  No hernia is present.  Musculoskeletal:        General: No swelling, tenderness, deformity or signs of injury. Normal range of motion.     Right lower leg: No edema.     Left lower leg: No edema.  Lymphadenopathy:     Cervical: No cervical adenopathy.  Skin:    General: Skin is warm and dry.     Capillary Refill: Capillary refill takes less than 2 seconds.     Coloration: Skin is not jaundiced or pale.     Findings: No bruising, erythema, lesion or rash.  Neurological:     General: No focal deficit present.     Mental Status: He is alert and oriented to person, place, and time.     Cranial Nerves: No cranial nerve deficit.     Sensory: No sensory deficit.     Motor: No weakness.     Coordination: Coordination normal.     Gait: Gait normal.     Deep Tendon Reflexes: Reflexes normal.  Psychiatric:        Mood and Affect: Mood normal.        Behavior: Behavior normal.        Thought Content: Thought content normal.        Judgment: Judgment normal.  Assessment & Plan:  1. Routine general medical examination at a health care facility -Encouraged exercise and diabetic diet -Follow-up in 1 year or sooner if needed - CBC with Differential/Platelet; Future - Hemoglobin A1c; Future - Lipid panel; Future - TSH; Future - CMP with eGFR([facility]); Future  2. Essential hypertension -Question whether he is actually compliant with medication.  I will have him make sure he is taking all of his blood pressure medication, we reviewed these medications in the office today, check blood pressure twice daily and follow-up via virtual visit in 1 week with readings - CBC with Differential/Platelet; Future - Hemoglobin A1c; Future - Lipid panel; Future - TSH; Future - CMP with eGFR([facility]); Future - hydrochlorothiazide (HYDRODIURIL) 25 MG tablet; Take 1 tablet (25 mg total) by mouth daily.  Dispense: 90 tablet; Refill: 3 - CMP with eGFR([facility]) - TSH - Lipid panel - Hemoglobin A1c - CBC with  Differential/Platelet  3. Uncontrolled type 2 diabetes mellitus with diabetic neuropathy, with long-term current use of insulin ([place]) -Consider increase in statin.  Again, he is encouraged to abide by a heart healthy diabetic diet -Follow-up in 3 months - CBC with Differential/Platelet; Future - Hemoglobin A1c; Future - Lipid panel; Future - TSH; Future - CMP with eGFR([facility]); Future - metFORMIN (GLUCOPHAGE) 1000 MG tablet; Take 1 tablet (1,000 mg total) by mouth 2 (two) times daily with a meal.  Dispense: 180 tablet; Refill: 0 - glipiZIDE (GLUCOTROL XL) 10 MG 24 hr tablet; Take 1 tablet (10 mg total) by mouth daily with breakfast.  Dispense: 90 tablet; Refill: 0 - hydrochlorothiazide (HYDRODIURIL) 25 MG tablet; Take 1 tablet (25 mg total) by mouth daily.  Dispense: 90 tablet; Refill: 3 - CMP with eGFR([facility]) - TSH - Lipid panel - Hemoglobin A1c - CBC with Differential/Platelet  4. Coronary artery disease involving coronary bypass graft of native heart without angina pectoris - Consider increase in statin. Follow up with Cardiology as directed - CBC with Differential/Platelet; Future - Hemoglobin A1c; Future - Lipid panel; Future - TSH; Future - CMP with eGFR([facility]); Future - CMP with eGFR([facility]) - TSH - Lipid panel - Hemoglobin A1c - CBC with Differential/Platelet  5. Prostate cancer screening  - PSA; Future - PSA  6. Need for influenza vaccination  - Flu Vaccine QUAD High Dose([facility])  7. Mixed hyperlipidemia - Consider increase in statin  - CBC with Differential/Platelet; Future - Hemoglobin A1c; Future - Lipid panel; Future - TSH; Future   [name], NP

## 2019-11-07 DIAGNOSIS — I25810 Atherosclerosis of coronary artery bypass graft(s) without angina pectoris: Secondary | ICD-10-CM

## 2019-11-07 LAB — COMPLETE METABOLIC PANEL WITH GFR
AG Ratio: 1.5 (calc) (ref 1.0–2.5)
ALT: 27 U/L (ref 9–46)
AST: 29 U/L (ref 10–35)
Albumin: 4.5 g/dL (ref 3.6–5.1)
Alkaline phosphatase (APISO): 65 U/L (ref 35–144)
BUN/Creatinine Ratio: 11 (calc) (ref 6–22)
BUN: 14 mg/dL (ref 7–25)
CO2: 31 mmol/L (ref 20–32)
Calcium: 9.9 mg/dL (ref 8.6–10.3)
Chloride: 100 mmol/L (ref 98–110)
Creat: 1.23 mg/dL — ABNORMAL HIGH (ref 0.70–1.18)
GFR, Est African American: 68 mL/min/{1.73_m2} (ref >=60)
GFR, Est Non African American: 58 mL/min/{1.73_m2} — ABNORMAL LOW (ref >=60)
Globulin: 3 g/dL (calc) (ref 1.9–3.7)
Glucose, Bld: 113 mg/dL — ABNORMAL HIGH (ref 65–99)
Potassium: 3.9 mmol/L (ref 3.5–5.3)
Sodium: 139 mmol/L (ref 135–146)
Total Bilirubin: 0.5 mg/dL (ref 0.2–1.2)
Total Protein: 7.5 g/dL (ref 6.1–8.1)

## 2019-11-07 LAB — CBC WITH DIFFERENTIAL/PLATELET
Absolute Monocytes: 748 cells/uL (ref 200–950)
Basophils Absolute: 43 cells/uL (ref 0–200)
Basophils Relative: 0.5 %
Eosinophils Absolute: 224 cells/uL (ref 15–500)
Eosinophils Relative: 2.6 %
HCT: 39.4 % (ref 38.5–50.0)
Hemoglobin: 13.4 g/dL (ref 13.2–17.1)
Lymphs Abs: 2752 cells/uL (ref 850–3900)
MCH: 31 pg (ref 27.0–33.0)
MCHC: 34 g/dL (ref 32.0–36.0)
MCV: 91.2 fL (ref 80.0–100.0)
MPV: 11.2 fL (ref 7.5–12.5)
Monocytes Relative: 8.7 %
Neutro Abs: 4833 cells/uL (ref 1500–7800)
Neutrophils Relative %: 56.2 %
Platelets: 212 10*3/uL (ref 140–400)
RBC: 4.32 10*6/uL (ref 4.20–5.80)
RDW: 12.9 % (ref 11.0–15.0)
Total Lymphocyte: 32 %
WBC: 8.6 10*3/uL (ref 3.8–10.8)

## 2019-11-07 LAB — LIPID PANEL
Cholesterol: 226 mg/dL — ABNORMAL HIGH (ref <200)
HDL: 96 mg/dL (ref >=40)
LDL Cholesterol (Calc): 110 mg/dL (calc) — ABNORMAL HIGH
Non-HDL Cholesterol (Calc): 130 mg/dL (calc) — ABNORMAL HIGH (ref <130)
Total CHOL/HDL Ratio: 2.4 (calc) (ref <5.0)
Triglycerides: 94 mg/dL (ref <150)

## 2019-11-07 LAB — HEMOGLOBIN A1C
Hgb A1c MFr Bld: 8.2 % of total Hgb — ABNORMAL HIGH (ref <5.7)
Mean Plasma Glucose: 189 (calc)
eAG (mmol/L): 10.4 (calc)

## 2019-11-07 LAB — TSH: TSH: 1.58 mIU/L (ref 0.40–4.50)

## 2019-11-07 LAB — PSA: PSA: 1.23 ng/mL (ref <=4.0)

## 2019-11-07 MED ORDER — ROSUVASTATIN CALCIUM 40 MG PO TABS: 40.0000 mg | ORAL_TABLET | Freq: Every day | ORAL | 3 refills | Status: DC

## 2019-11-13 ENCOUNTER — Other Ambulatory Visit: Payer: Self-pay

## 2019-11-13 ENCOUNTER — Telehealth (INDEPENDENT_AMBULATORY_CARE_PROVIDER_SITE_OTHER): Payer: Medicare PPO | Admitting: Adult Health

## 2019-11-13 VITALS — BP 138/77

## 2019-11-13 DIAGNOSIS — I10 Essential (primary) hypertension: Secondary | ICD-10-CM | POA: Diagnosis not present

## 2019-11-13 NOTE — Progress Notes (Signed)
Virtual Visit via Telephone Note  I connected with [name] on 11/13/19 at  3:30 PM EDT by telephone and verified that I am speaking with the correct person using two identifiers.   I discussed the limitations, risks, security and privacy concerns of performing an evaluation and management service by telephone and the availability of in person appointments. I also discussed with the patient that there may be a patient responsible charge related to this service. The patient expressed understanding and agreed to proceed.  Location patient: home Location provider: work or home office Participants present for the call: patient, provider Patient did not have a visit in the prior 7 days to address this/these issue(s).   History of Present Illness: 74 year old male who is being evaluated today for follow-up regarding essential hypertension.  When he was seen in the office 1 week ago his blood pressure was elevated despite taking his medications.  He is currently prescribed metoprolol 25 mg BID and HCTZ 25 mg.  He was asked to monitor his blood pressures at home and follow-up in 1 week.  Reports today that he has been measuring his blood pressures twice a day the highest reading he is received has been 148/78 and the rest of them have been in the 130s over 60s to 70s.   Observations/Objective: Patient sounds cheerful and well on the phone. I do not appreciate any SOB. Speech and thought processing are grossly intact. Patient reported vitals:  Assessment and Plan: 1. Essential hypertension -BP better controlled at home.  I am not going to make any changes on his medication at this time.  He would like to work on lifestyle modifications and he will follow-up in 3 months that is repeat A1c.   Follow Up Instructions:   I did not refer this patient for an OV in the next 24 hours for this/these issue(s).  I discussed the assessment and treatment plan with the patient. The patient was provided  an opportunity to ask questions and all were answered. The patient agreed with the plan and demonstrated an understanding of the instructions.   The patient was advised to call back or seek an in-person evaluation if the symptoms worsen or if the condition fails to improve as anticipated.  I provided 8 minutes of non-face-to-face time during this encounter.   [name], NP

## 2019-11-13 NOTE — Patient Instructions (Signed)
° ° ° °  If you have lab work done today you will be contacted with your lab results within the next 2 weeks.  If you have not heard from us then please contact us. The fastest way to get your results is to register for My Chart. ° ° °IF you received an x-ray today, you will receive an invoice from [facility]. Please contact [facility] at [phone] with questions or concerns regarding your invoice.  ° °IF you received labwork today, you will receive an invoice from [facility]. Please contact [facility] at [phone] with questions or concerns regarding your invoice.  ° °Our billing staff will not be able to assist you with questions regarding bills from these companies. ° °You will be contacted with the lab results as soon as they are available. The fastest way to get your results is to activate your My Chart account. Instructions are located on the last page of this paperwork. If you have not heard from us regarding the results in 2 weeks, please contact this office. °  ° ° ° °

## 2020-02-18 ENCOUNTER — Ambulatory Visit (INDEPENDENT_AMBULATORY_CARE_PROVIDER_SITE_OTHER): Payer: Medicare PPO

## 2020-02-18 DIAGNOSIS — E1165 Type 2 diabetes mellitus with hyperglycemia: Secondary | ICD-10-CM

## 2020-02-18 DIAGNOSIS — E1140 Type 2 diabetes mellitus with diabetic neuropathy, unspecified: Secondary | ICD-10-CM | POA: Diagnosis not present

## 2020-02-18 DIAGNOSIS — Z0000 Encounter for general adult medical examination without abnormal findings: Secondary | ICD-10-CM

## 2020-02-18 DIAGNOSIS — IMO0002 Reserved for concepts with insufficient information to code with codable children: Secondary | ICD-10-CM

## 2020-02-18 DIAGNOSIS — Z794 Long term (current) use of insulin: Secondary | ICD-10-CM

## 2020-02-18 DIAGNOSIS — I10 Essential (primary) hypertension: Secondary | ICD-10-CM | POA: Diagnosis not present

## 2020-02-18 NOTE — Progress Notes (Signed)
Subjective:   [name] is a 75 y.o. male who presents for Medicare Annual/Subsequent preventive examination.  Virtual Visit via Video Note  I connected with [name] on 02/18/20 at  2:45 PM EST by a video enabled telemedicine application and verified that I am speaking with the correct person using two identifiers.  Location: Patient: Home  Provider: Office   I discussed the limitations of evaluation and management by telemedicine and the availability of in person appointments. The patient expressed understanding and agreed to proceed.       Brandon Neas, LPN    Review of Systems    N/A  Cardiac Risk Factors include: advanced age (>68mn, >>44women);male gender;hypertension;diabetes mellitus;dyslipidemia     Objective:    Today's Vitals   There is no height or weight on file to calculate BMI.  Advanced Directives 02/18/2020 02/18/2019 02/08/2019 01/27/2019 02/01/2016 12/05/2015 11/30/2015  Does Patient Have a Medical Advance Directive? No Yes Yes No No No No  Type of [place]ive - [place] - - - -  Does patient want to make changes to medical advance directive? - No - Patient declined - - - - -  Would patient like information on creating a medical advance directive? No - Patient declined - - Yes (MAU/Ambulatory/Procedural Areas - Information given) - No - patient declined information No - patient declined information    Current Medications (verified) Outpatient Encounter Medications as of 02/18/2020  Medication Sig  . aspirin EC 81 MG tablet Take 81 mg by mouth daily.  . Blood Glucose Monitoring Suppl (ACCU-CHEK GUIDE ME) w/Device KIT 1 Stick by Does not apply route 3 (three) times daily. Used to check blood sugar 3 times daily  . cholecalciferol (VITAMIN D) 25 MCG tablet Take 1 tablet (1,000 Units total) by mouth daily.  .[name] KitchenglipiZIDE (GLUCOTROL XL) 10 MG 24 hr tablet Take 1 tablet  (10 mg total) by mouth daily with breakfast.  . glucose blood (ACCU-CHEK GUIDE) test strip Use as instructed  . hydrochlorothiazide (HYDRODIURIL) 25 MG tablet Take 1 tablet (25 mg total) by mouth daily.  . insulin glargine (LANTUS) 100 UNIT/ML Solostar Pen Inject 32 Units into the skin at bedtime.  . Insulin Pen Needle (B-D ULTRAFINE III SHORT PEN) 31G X 8 MM MISC Use QHS  . metFORMIN (GLUCOPHAGE) 1000 MG tablet Take 1 tablet (1,000 mg total) by mouth 2 (two) times daily with a meal.  . metoprolol tartrate (LOPRESSOR) 25 MG tablet Take 1 tablet (25 mg total) by mouth 2 (two) times daily.  . rosuvastatin (CRESTOR) 40 MG tablet Take 1 tablet (40 mg total) by mouth at bedtime.  .[name] Kitchenalbuterol (VENTOLIN HFA) 108 (90 Base) MCG/ACT inhaler Inhale 1-2 puffs into the lungs 2 (two) times daily as needed for wheezing or shortness of breath. (Patient not taking: Reported on 02/18/2020)  . ascorbic acid (VITAMIN C) 500 MG tablet Take 1 tablet (500 mg total) by mouth daily. (Patient not taking: Reported on 02/18/2020)  . famotidine (PEPCID) 20 MG tablet Take 1 tablet (20 mg total) by mouth 2 (two) times daily. (Patient not taking: Reported on 02/18/2020)  . fluticasone (FLONASE) 50 MCG/ACT nasal spray Place 2 sprays into both nostrils daily for 3 days. (Patient not taking: Reported on 02/18/2020)  . zinc sulfate 220 (50 Zn) MG capsule Take 1 capsule (220 mg total) by mouth daily. (Patient not taking: Reported on 02/18/2020)   No facility-administered encounter medications  on file as of 02/18/2020.    Allergies (verified) Amiodarone and Quinolones   History: Past Medical History:  Diagnosis Date  . 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 Carpal tunnel syndrome 09/24/2014   Bilateral  . 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 COVID-19   . Diabetes mellitus, type 2 ([place])   . Erectile dysfunction   . Essential hypertension   . Hyperlipidemia   .  Palpitations    Cardiac monitor 03/2019: normal sinus rhythm, no AF, rare NSVT, VT   Past Surgical History:  Procedure Laterality Date  . AORTIC VALVE REPLACEMENT N/A 10/29/2015   Procedure: AORTIC VALVE REPLACEMENT (AVR) WITH 23MM MAGNA EASE TISSUE VALVE.;  Surgeon: Brandon Isaac, MD;  Location: [facility];  Service: Open Heart Surgery;  Laterality: N/A;  . CARDIAC CATHETERIZATION N/A 10/27/2015   Procedure: Left Heart Cath and Coronary Angiography;  Surgeon: [name], MD;  Location: [facility];  Service: Cardiovascular;  Laterality: N/A;  . CORONARY ARTERY BYPASS GRAFT N/A 10/29/2015   Procedure: CORONARY ARTERY BYPASS GRAFTING (CABG) x Four UTILIZING THE LEFT INTERNAL MAMMARY ARTERY AND ENDOSCOPICALLY HARVESTED RIGHT SAPEHENEOUS VEINS.;  Surgeon: Brandon Isaac, MD;  Location: [facility];  Service: Open Heart Surgery;  Laterality: N/A;  . CYST REMOVAL HAND Right   . ESOPHAGOGASTRODUODENOSCOPY N/A 12/02/2015   Procedure: ESOPHAGOGASTRODUODENOSCOPY (EGD);  Surgeon: Brandon Gunning, MD;  Location: [facility];  Service: Gastroenterology;  Laterality: N/A;  . REPLACEMENT ASCENDING AORTA N/A 10/29/2015   Procedure: REPLACEMENT OF ASCENDING AORTA USING 34MM X 30CM WOVEN DOUBLE VELOUR VASCULAR GRAFT.;  Surgeon: Brandon Isaac, MD;  Location: [facility];  Service: Open Heart Surgery;  Laterality: N/A;  . TEE WITHOUT CARDIOVERSION N/A 10/29/2015   Procedure: TRANSESOPHAGEAL ECHOCARDIOGRAM (TEE);  Surgeon: Brandon Isaac, MD;  Location: [facility];  Service: Open Heart Surgery;  Laterality: N/A;  . UVULECTOMY N/A 12/10/2015   Procedure: UVULECTOMY;  Surgeon: Brandon Marble, MD;  Location: [facility];  Service: ENT;  Laterality: N/A;   Family History  Problem Relation Age of Onset  . Cancer Father        Throat cancer  . Diabetes Sister    Social History   Socioeconomic History  . Marital status: Widowed    Spouse name: Not on file  . Number of children: 5  . Years of education: 10   .  Highest education level: 10th grade  Occupational History  . Occupation: custodian    Comment: PT  Tobacco Use  . Smoking status: Never Smoker  . Smokeless tobacco: Never Used  Substance and Sexual Activity  . Alcohol use: Yes    Alcohol/week: 1.0 standard drink    Types: 1 Glasses of wine per week    Comment: 1 x month  . Drug use: No  . Sexual activity: Yes  Other Topics Concern  . Not on file  Social History Narrative   Widowed to [name] for 50 years; 2 daughters liver with him now   Works part time as Chiropodist   5 children; 1 son deceased   Never Smoked   Alcohol use- no   Caffeine use: occasional       Lost one child a few months ago    74 his sister, then son and then nephew - all within 2 weeks apart   Son had a stroke at 56    Was planning  to get married last month in Feb.    Social Determinants of Health   Financial Resource Strain: Low Risk   . Difficulty of Paying Living Expenses: Not hard at all  Food Insecurity: No Food Insecurity  . Worried About Charity fundraiser in the Last Year: Never true  . Ran Out of Food in the Last Year: Never true  Transportation Needs: No Transportation Needs  . Lack of Transportation (Medical): No  . Lack of Transportation (Non-Medical): No  Physical Activity: Insufficiently Active  . Days of Exercise per Week: 3 days  . Minutes of Exercise per Session: 20 min  Stress: No Stress Concern Present  . Feeling of Stress : Not at all  Social Connections: Moderately Isolated  . Frequency of Communication with Friends and Family: More than three times a week  . Frequency of Social Gatherings with Friends and Family: More than three times a week  . Attends Religious Services: More than 4 times per year  . Active Member of Clubs or Organizations: No  . Attends Archivist Meetings: Never  . Marital Status: Widowed    Tobacco Counseling Counseling given: Not Answered   Clinical  Intake:  Pre-visit preparation completed: Yes  Pain : No/denies pain     Nutritional Risks: None Diabetes: Yes CBG done?: No Did pt. bring in CBG monitor from home?: No  How often do you need to have someone help you when you read instructions, pamphlets, or other written materials from your doctor or pharmacy?: 1 - Never What is the last grade level you completed in school?: High School  Diabetic?Yes  Nutrition Risk Assessment:  Has the patient had any N/V/D within the last 2 months?  No  Does the patient have any non-healing wounds?  No  Has the patient had any unintentional weight loss or weight gain?  No   Diabetes:  Is the patient diabetic?  Yes  If diabetic, was a CBG obtained today?  No  Did the patient bring in their glucometer from home?  No  How often do you monitor your CBG's? Patient states that he checks his glucose once to twice a week .   Financial Strains and Diabetes Management:  Are you having any financial strains with the device, your supplies or your medication? Yes .  Does the patient want to be seen by Chronic Care Management for management of their diabetes?  Yes  Would the patient like to be referred to a Nutritionist or for Diabetic Management?  No   Diabetic Exams:  Diabetic Eye Exam: Overdue for diabetic eye exam. Pt has been advised about the importance in completing this exam. Patient advised to call and schedule an eye exam. Diabetic Foot Exam: Completed 11/06/2019  Interpreter Needed?: No  Information entered by :: [place] of Daily Living In your present state of health, do you have any difficulty performing the following activities: 02/18/2020  Hearing? N  Vision? N  Difficulty concentrating or making decisions? N  Walking or climbing stairs? N  Dressing or bathing? N  Doing errands, shopping? N  Preparing Food and eating ? N  Using the Toilet? N  In the past six months, have you accidently leaked urine? N  Do you  have problems with loss of bowel control? N  Managing your Medications? N  Managing your Finances? N  Housekeeping or managing your Housekeeping? N  Some recent data might be hidden    Patient Care Team: Nafziger,  Tommi Rumps, NP as PCP - General (Family Medicine) [name], [name], MD as PCP - Cardiology (Cardiology) Alda Berthold, DO as Consulting Physician (Neurology) Berle Mull, MD as Consulting Physician (Family Medicine)  Indicate any recent Medical Services you may have received from other than [facility] providers in the past year (date may be approximate).     Assessment:   This is a routine wellness examination for [place].  Hearing/Vision screen  Hearing Screening   '125Hz'  '250Hz'  '500Hz'  '1000Hz'  '2000Hz'  '3000Hz'  '4000Hz'  '6000Hz'  '8000Hz'   Right ear:           Left ear:           Vision Screening Comments: Patient states gets his eyes checked once per year. Has been having some issues with right eye has had some recent laser surgery.   Dietary issues and exercise activities discussed: Current Exercise Habits: Home exercise routine, Type of exercise: walking, Time (Minutes): 15, Frequency (Times/Week): 3, Weekly Exercise (Minutes/Week): 45, Intensity: Mild  Goals    . Exercise 3x per week (30 min per time)    . HEMOGLOBIN A1C < 7    . Patient Stated     Keep walking and maintain health     . Patient Stated     I would like to get me a food truck.       Depression Screen PHQ 2/9 Scores 02/18/2020 11/13/2019 01/27/2019 04/24/2017 04/24/2017 05/19/2016 02/16/2016  PHQ - 2 Score 0 0 0 0 0 0 0  PHQ- 9 Score 0 - - - - - -    Fall Risk Fall Risk  02/18/2020 11/13/2019 01/27/2019 04/24/2017 04/24/2017  Falls in the past year? 0 0 0 No No  Number falls in past yr: 0 0 - - -  Injury with Fall? 0 0 - - -  Risk for fall due to : Medication side effect No Fall Risks - - -  Follow up Falls evaluation completed;Falls prevention discussed Falls evaluation completed - - -    FALL RISK PREVENTION PERTAINING  TO THE HOME:  Any stairs in or around the home? Yes  If so, are there any without handrails? No  Home free of loose throw rugs in walkways, pet beds, electrical cords, etc? Yes  Adequate lighting in your home to reduce risk of falls? Yes   ASSISTIVE DEVICES UTILIZED TO PREVENT FALLS:  Life alert? No  Use of a cane, walker or w/c? No  Grab bars in the bathroom? No  Shower chair or bench in shower? No  Elevated toilet seat or a handicapped toilet? No     Cognitive Function: Normal cognitive status assessed by direct observation by this Nurse Health Advisor. No abnormalities found.   MMSE - Mini Mental State Exam 04/24/2017  Not completed: (No Data)     6CIT Screen 01/27/2019  What Year? 0 points  What month? 0 points  What time? 0 points  Count back from 20 0 points  Months in reverse 0 points  Repeat phrase 0 points  Total Score 0    Immunizations Immunization History  Administered Date(s) Administered  . Fluad Quad(high Dose 65+) 11/06/2019  . Influenza Whole 03/15/2009, 04/06/2010  . Influenza, High Dose Seasonal PF 12/29/2013, 12/25/2014, 04/24/2017, 01/16/2018  . Influenza,inj,Quad PF,6+ Mos 11/12/2012, 11/25/2015  . Pneumococcal Conjugate-13 04/24/2014  . Pneumococcal Polysaccharide-23 12/13/2007, 11/25/2015  . Td 09/13/2008    TDAP status: Due, Education has been provided regarding the importance of this vaccine. Advised may receive this vaccine at local  pharmacy or Health Dept. Aware to provide a copy of the vaccination record if obtained from local pharmacy or Health Dept. Verbalized acceptance and understanding.  Flu Vaccine status: Up to date  Pneumococcal vaccine status: Up to date  Covid-19 vaccine status: Completed vaccines  Qualifies for Shingles Vaccine? Yes   Zostavax completed No   Shingrix Completed?: No.    Education has been provided regarding the importance of this vaccine. Patient has been advised to call insurance company to determine out  of pocket expense if they have not yet received this vaccine. Advised may also receive vaccine at local pharmacy or Health Dept. Verbalized acceptance and understanding.  Screening Tests Health Maintenance  Topic Date Due  . COVID-19 Vaccine (1) Never done  . URINE MICROALBUMIN  12/05/2019  . OPHTHALMOLOGY EXAM  01/01/2020  . HEMOGLOBIN A1C  05/05/2020  . COLONOSCOPY (Pts 45-82yr Insurance coverage will need to be confirmed)  09/01/2020  . FOOT EXAM  11/05/2020  . INFLUENZA VACCINE  Completed  . Hepatitis C Screening  Completed  . PNA vac Low Risk Adult  Completed  . TETANUS/TDAP  Discontinued    Health Maintenance  Health Maintenance Due  Topic Date Due  . COVID-19 Vaccine (1) Never done  . URINE MICROALBUMIN  12/05/2019  . OPHTHALMOLOGY EXAM  01/01/2020    Colorectal cancer screening: Type of screening: Colonoscopy. Completed 09/02/2015. Repeat every 5 years  Lung Cancer Screening: (Low Dose CT Chest recommended if Age 73-80years, 30 pack-year currently smoking OR have quit w/in 15years.) does not qualify.   Lung Cancer Screening Referral: N/A   Additional Screening:  Hepatitis C Screening: does qualify; Completed 11/19/2015   Vision Screening: Recommended annual ophthalmology exams for early detection of glaucoma and other disorders of the eye. Is the patient up to date with their annual eye exam?  Yes  Who is the provider or what is the name of the office in which the patient attends annual eye exams? [facility] [address] If pt is not established with a provider, would they like to be referred to a provider to establish care? No .   Dental Screening: Recommended annual dental exams for proper oral hygiene  Community Resource Referral / Chronic Care Management: CRR required this visit?  No   CCM required this visit?  No      Plan:     I have personally reviewed and noted the following in the patient's chart:   . Medical and social history . Use of  alcohol, tobacco or illicit drugs  . Current medications and supplements . Functional ability and status . Nutritional status . Physical activity . Advanced directives . List of other physicians . Hospitalizations, surgeries, and ER visits in previous 12 months . Vitals . Screenings to include cognitive, depression, and falls . Referrals and appointments  In addition, I have reviewed and discussed with patient certain preventive protocols, quality metrics, and best practice recommendations. A written personalized care plan for preventive services as well as general preventive health recommendations were provided to patient.     SOfilia Neas LPN   11/22/4707  Nurse Notes: None

## 2020-02-18 NOTE — Patient Instructions (Addendum)
[name] , Thank you for taking time to come for your Medicare Wellness Visit. I appreciate your ongoing commitment to your health goals. Please review the following plan we discussed and let me know if I can assist you in the future.   Screening recommendations/referrals: Colonoscopy: Up to date,next due 09/01/2020 Recommended yearly ophthalmology/optometry visit for glaucoma screening and checkup Recommended yearly dental visit for hygiene and checkup  Vaccinations: Influenza vaccine: Up to date, next due fall 2022  Pneumococcal vaccine: Completed series  Tdap vaccine: Currently due,if you wish to receive we recommend that you contact your insurance company to discuss cost or you may await and injury to receive Shingles vaccine: Currently due for Shingrix, if you wish to receive we recommend that you do so at your local pharmacy.     Advanced directives: Advance directive discussed with you today. Even though you declined this today please call our office should you change your mind and we can give you the proper paperwork for you to fill out.   Conditions/risks identified: we have placed a referral for you to speak to the pharmacy in regards to your medications to see if we can help find you resources for your medications that are to expensive.  Next appointment: 02/18/2021 @ 2:45 PM with Nurse Health Advisor   Preventive Care 65 Years and Older, Male Preventive care refers to lifestyle choices and visits with your health care provider that can promote health and wellness. What does preventive care include?  A yearly physical exam. This is also called an annual well check.  Dental exams once or twice a year.  Routine eye exams. Ask your health care provider how often you should have your eyes checked.  Personal lifestyle choices, including:  Daily care of your teeth and gums.  Regular physical activity.  Eating a healthy diet.  Avoiding tobacco and drug use.  Limiting  alcohol use.  Practicing safe sex.  Taking low doses of aspirin every day.  Taking vitamin and mineral supplements as recommended by your health care provider. What happens during an annual well check? The services and screenings done by your health care provider during your annual well check will depend on your age, overall health, lifestyle risk factors, and family history of disease. Counseling  Your health care provider may ask you questions about your:  Alcohol use.  Tobacco use.  Drug use.  Emotional well-being.  Home and relationship well-being.  Sexual activity.  Eating habits.  History of falls.  Memory and ability to understand (cognition).  Work and work Astronomer. Screening  You may have the following tests or measurements:  Height, weight, and BMI.  Blood pressure.  Lipid and cholesterol levels. These may be checked every 5 years, or more frequently if you are over 95 years old.  Skin check.  Lung cancer screening. You may have this screening every year starting at age 58 if you have a 30-pack-year history of smoking and currently smoke or have quit within the past 15 years.  Fecal occult blood test (FOBT) of the stool. You may have this test every year starting at age 29.  Flexible sigmoidoscopy or colonoscopy. You may have a sigmoidoscopy every 5 years or a colonoscopy every 10 years starting at age 54.  Prostate cancer screening. Recommendations will vary depending on your family history and other risks.  Hepatitis C blood test.  Hepatitis B blood test.  Sexually transmitted disease (STD) testing.  Diabetes screening. This is done by checking your  blood sugar (glucose) after you have not eaten for a while (fasting). You may have this done every 1-3 years.  Abdominal aortic aneurysm (AAA) screening. You may need this if you are a current or former smoker.  Osteoporosis. You may be screened starting at age 11 if you are at high risk. Talk  with your health care provider about your test results, treatment options, and if necessary, the need for more tests. Vaccines  Your health care provider may recommend certain vaccines, such as:  Influenza vaccine. This is recommended every year.  Tetanus, diphtheria, and acellular pertussis (Tdap, Td) vaccine. You may need a Td booster every 10 years.  Zoster vaccine. You may need this after age 56.  Pneumococcal 13-valent conjugate (PCV13) vaccine. One dose is recommended after age 82.  Pneumococcal polysaccharide (PPSV23) vaccine. One dose is recommended after age 44. Talk to your health care provider about which screenings and vaccines you need and how often you need them. This information is not intended to replace advice given to you by your health care provider. Make sure you discuss any questions you have with your health care provider. Document Released: 02/26/2015 Document Revised: 10/20/2015 Document Reviewed: 12/01/2014 Elsevier Interactive Patient Education  2017 [place] Prevention in the Home Falls can cause injuries. They can happen to people of all ages. There are many things you can do to make your home safe and to help prevent falls. What can I do on the outside of my home?  Regularly fix the edges of walkways and driveways and fix any cracks.  Remove anything that might make you trip as you walk through a door, such as a raised step or threshold.  Trim any bushes or trees on the path to your home.  Use bright outdoor lighting.  Clear any walking paths of anything that might make someone trip, such as rocks or tools.  Regularly check to see if handrails are loose or broken. Make sure that both sides of any steps have handrails.  Any raised decks and porches should have guardrails on the edges.  Have any leaves, snow, or ice cleared regularly.  Use sand or salt on walking paths during winter.  Clean up any spills in your garage right away. This  includes oil or grease spills. What can I do in the bathroom?  Use night lights.  Install grab bars by the toilet and in the tub and shower. Do not use towel bars as grab bars.  Use non-skid mats or decals in the tub or shower.  If you need to sit down in the shower, use a plastic, non-slip stool.  Keep the floor dry. Clean up any water that spills on the floor as soon as it happens.  Remove soap buildup in the tub or shower regularly.  Attach bath mats securely with double-sided non-slip rug tape.  Do not have throw rugs and other things on the floor that can make you trip. What can I do in the bedroom?  Use night lights.  Make sure that you have a light by your bed that is easy to reach.  Do not use any sheets or blankets that are too big for your bed. They should not hang down onto the floor.  Have a firm chair that has side arms. You can use this for support while you get dressed.  Do not have throw rugs and other things on the floor that can make you trip. What can I do in  the kitchen?  Clean up any spills right away.  Avoid walking on wet floors.  Keep items that you use a lot in easy-to-reach places.  If you need to reach something above you, use a strong step stool that has a grab bar.  Keep electrical cords out of the way.  Do not use floor polish or wax that makes floors slippery. If you must use wax, use non-skid floor wax.  Do not have throw rugs and other things on the floor that can make you trip. What can I do with my stairs?  Do not leave any items on the stairs.  Make sure that there are handrails on both sides of the stairs and use them. Fix handrails that are broken or loose. Make sure that handrails are as long as the stairways.  Check any carpeting to make sure that it is firmly attached to the stairs. Fix any carpet that is loose or worn.  Avoid having throw rugs at the top or bottom of the stairs. If you do have throw rugs, attach them to the  floor with carpet tape.  Make sure that you have a light switch at the top of the stairs and the bottom of the stairs. If you do not have them, ask someone to add them for you. What else can I do to help prevent falls?  Wear shoes that:  Do not have high heels.  Have rubber bottoms.  Are comfortable and fit you well.  Are closed at the toe. Do not wear sandals.  If you use a stepladder:  Make sure that it is fully opened. Do not climb a closed stepladder.  Make sure that both sides of the stepladder are locked into place.  Ask someone to hold it for you, if possible.  Clearly mark and make sure that you can see:  Any grab bars or handrails.  First and last steps.  Where the edge of each step is.  Use tools that help you move around (mobility aids) if they are needed. These include:  Canes.  Walkers.  Scooters.  Crutches.  Turn on the lights when you go into a dark area. Replace any light bulbs as soon as they burn out.  Set up your furniture so you have a clear path. Avoid moving your furniture around.  If any of your floors are uneven, fix them.  If there are any pets around you, be aware of where they are.  Review your medicines with your doctor. Some medicines can make you feel dizzy. This can increase your chance of falling. Ask your doctor what other things that you can do to help prevent falls. This information is not intended to replace advice given to you by your health care provider. Make sure you discuss any questions you have with your health care provider. Document Released: 11/26/2008 Document Revised: 07/08/2015 Document Reviewed: 03/06/2014 Elsevier Interactive Patient Education  2017 [employer].   .

## 2020-02-24 ENCOUNTER — Other Ambulatory Visit: Payer: Self-pay | Admitting: Adult Health

## 2020-02-25 NOTE — Telephone Encounter (Signed)
DENIED.  PT IS PAST DUE FOR A1C CHECK.

## 2020-02-29 ENCOUNTER — Other Ambulatory Visit: Payer: Self-pay | Admitting: Physician Assistant

## 2020-03-12 ENCOUNTER — Telehealth (INDEPENDENT_AMBULATORY_CARE_PROVIDER_SITE_OTHER): Payer: Medicare PPO | Admitting: Family Medicine

## 2020-03-12 DIAGNOSIS — R0600 Dyspnea, unspecified: Secondary | ICD-10-CM

## 2020-03-12 DIAGNOSIS — R509 Fever, unspecified: Secondary | ICD-10-CM | POA: Diagnosis not present

## 2020-03-12 DIAGNOSIS — R221 Localized swelling, mass and lump, neck: Secondary | ICD-10-CM | POA: Diagnosis not present

## 2020-03-12 DIAGNOSIS — R110 Nausea: Secondary | ICD-10-CM

## 2020-03-12 DIAGNOSIS — R519 Headache, unspecified: Secondary | ICD-10-CM

## 2020-03-12 NOTE — Progress Notes (Signed)
Virtual Visit via Telephone Note  I connected with [name] on 03/12/20 at  4:20 PM EST by telephone and verified that I am speaking with the correct person using two identifiers.   I discussed the limitations, risks, security and privacy concerns of performing an evaluation and management service by telephone and the availability of in person appointments. I also discussed with the patient that there may be a patient responsible charge related to this service. The patient expressed understanding and agreed to proceed.  Location patient: home, [place] Location provider: work or home office Participants present for the call: patient, provider Patient did not have a visit with me in the prior 7 days to address this/these issue(s).   History of Present Illness:  Acute telemedicine visit for facial swelling and pain: -Onset: about 1 week ago but worsening -Symptoms include: swelling, redness and pain the R side of the face and down into the neck -worsened since yesterday; now reports he feels nauseous today, subjective fever, sweats, has felt a little SOB today on the stairs which he reports is not normal for him -Denies:CP, difficulty swallowing, SOB currently -reports hx of heart surgery and reports was told to be careful if has mouth infection - reports the whole bottom gum is sore too - reports does not have teeth    Observations/Objective: Patient sounds cheerful and well on the phone. I do not appreciate any SOB. Speech and thought processing are grossly intact. Patient reported vitals:  Assessment and Plan:  Facial pain  Neck swelling  Subjective fever  Nausea  Dyspnea, unspecified type  -we discussed possible serious and likely etiologies, options for evaluation and workup, limitations of telemedicine visit vs in person visit, treatment, treatment risks and precautions. Pt prefers to treat via telemedicine empirically rather than in person at this moment. Given his  symptoms advised prompt inperson evaluation at a higher level of care. He has opted to have his daughter take him and agrees to do so right away.   Follow Up Instructions:  I did not refer this patient for an OV with me in the next 24 hours for this/these issue(s).  I discussed the assessment and treatment plan with the patient. The patient was provided an opportunity to ask questions and all were answered. The patient agreed with the plan and demonstrated an understanding of the instructions.   I spent 18 minutes on the date of this visit in the care of this patient. See summary of tasks completed to properly care for this patient in the detailed notes above which also included review of PMH, medications, allergies, evaluation of the patient and counseling patient on testing and care options.     Lucretia Kern, DO

## 2020-03-15 ENCOUNTER — Encounter: Payer: Self-pay | Admitting: Cardiovascular Disease

## 2020-03-15 NOTE — Progress Notes (Signed)
[name] Date of Birth  Sep 02, 1945     [address]    [address]    [place], [place]  [address]        Problems: 1. Chest pain 2. CAD - s/p CABG  - Sept. 2017  3. Aortic stenosis - s/p AVR Sept. 2017.  4. Hypercholesterolemia 5. Diabetes mellitus 6. 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 These chest pains are described as a left-sided pain. He seemed to come on with rest and exertion.  The pains last for hours.    He has been quite regularly.  He does not do any regular exercise.  He works as a Sports coach.  He had a stress Myoview study which was normal. His echocardiogram revealed  Left ventricle: The cavity size was normal. Wall thickness was increased in a pattern of mild LVH. Systolic function was normal. The estimated ejection fraction was in the range of 55% to 60%. 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 Mean gradient: 36m Hg (S). Peak gradient: 347mHg (S). - Atrial septum: No defect or patent foramen ovale was identified.  He still has some occasional episodes of chest pain that he describes as a "pins and needles" like sensation.  He's able to get out and exercise without any problems.  Dec. 11, 2015:  [name] a 89o who I follow for episodes of chest pain, mild aortic stenosis, mild aortic dilitation He is seen back after a 2 year absence.   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   No CP or dyspnea.   Walks occasionally ,  A recent echo showed mild AS and trace MR.  He has mild aortic dilitation.  July 23 2015:  [name] seen back after a 2 year absence . He's continued to have some episodes of chest pain. We performed a stress Myoview study on him in 2013 which was normal. These episodes  of pain are not associated with eating, drinking, exercise, or change of position. The pains are exacerbated by leaning forward. He's not tried any specific medications. 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  He notes some shortness of breath with exertion when he is climbing stairs. This seems to be stable.  Dec. 7, 2017:  [name] seen back  Had CABG ( 4 vessel)  + AVR (23MM MAGNA EASE TISSUE VALVE. (N/A) on Sept.  left internal mammary to the left anterior descending coronary artery, sequential reverse SVG  to the first and second obtuse marginal, SVG to the distal right coronary artery  He has had multiple hospitalizations (5)  since that time for nausea and vomiting. Eventually his amiodarone was discontinued.  This seems to have helped   Hospitalization was complicated by trauma to the [place] - required surgical removal .   He was told by someone to stop his Xarelto and start an aspirin.  Took Xarelto for a month.   April 24, 2016:  [name] seen today .  No CP or dyspnea.  Healing up from his CABG in Sept. 2017.  Oct. 2, 2018:    [name] is seen for follow-up of his coronary artery disease, hyperlipidemia, hypertension and aortic valve placement. He still has dilatation of his ascending aorta.  has some chest soreness at times , when he picks up something a bit heavy .  Not angina ,   A soreness.  Getting some regular exercise  BP is slightly elevated  Still eating some salty foods , canned green beans  No syncope or presyncope. He denies any chest pain or shortness of breath.  Jul 13, 2017: Seen for follow up of his CAD and AVR in Sept 2017.    Has had some chest wall tenderness.   Has been present for several months   Also has some left breast swelling that has been present for several weeks.   Associated with itching .   Has not discussed with primrary MD    Breathing has been good .   Some DOE with climbing steps .  Has not been exercising   Feb. 1, 2022 [name] is seen today for follow up of his CAD, AVR in Sept, 2017,  BP is mildly elevated.  He was last seen 3 years ago  Doing well.  Doing custodial work at [employer]  No CP , no dyspnea    Current Outpatient Medications on File Prior to Visit  Medication Sig Dispense Refill  . albuterol (VENTOLIN HFA) 108 (90 Base) MCG/ACT inhaler Inhale 1-2 puffs into the lungs 2 (two) times daily as needed for wheezing or shortness of breath. (Patient not taking: Reported on 02/18/2020) 8 g 0  . ascorbic acid (VITAMIN C) 500 MG tablet Take 1 tablet (500 mg total) by mouth daily. (Patient not taking: Reported on 02/18/2020) 60 tablet 0  . aspirin EC 81 MG tablet Take 81 mg by mouth daily.    . Blood Glucose Monitoring Suppl (ACCU-CHEK GUIDE ME) w/Device KIT 1 Stick by Does not apply route 3 (three) times daily. Used to check blood sugar 3 times daily 1 kit 0  . cholecalciferol (VITAMIN D) 25 MCG tablet Take 1 tablet (1,000 Units total) by mouth daily. 60 tablet 0  . famotidine (PEPCID) 20 MG tablet Take 1 tablet (20 mg total) by mouth 2 (two) times daily. (Patient not taking: Reported on 02/18/2020) 30 tablet 0  . fluticasone (FLONASE) 50 MCG/ACT nasal spray Place 2 sprays into both nostrils daily for 3 days. (Patient not taking: Reported on 02/18/2020) 11.1 mL 0  . glipiZIDE (GLUCOTROL XL) 10 MG 24 hr tablet Take 1 tablet (10 mg total) by mouth daily with breakfast. 90 tablet 0  . glucose blood (ACCU-CHEK GUIDE) test strip Use as instructed 100 each 1  . hydrochlorothiazide (HYDRODIURIL) 25 MG tablet Take 1 tablet (25 mg total) by mouth daily. 90 tablet 3  . insulin glargine (LANTUS) 100 UNIT/ML Solostar Pen Inject 32 Units into the skin at bedtime.    . Insulin Pen Needle (B-D ULTRAFINE III SHORT PEN) 31G X 8 MM MISC Use QHS 90 each 3  . metFORMIN (GLUCOPHAGE) 1000 MG tablet Take 1 tablet  (1,000 mg total) by mouth 2 (two) times daily with a meal. 180 tablet 0  . metoprolol tartrate (LOPRESSOR) 25 MG tablet TAKE 1 TABLET BY MOUTH TWICE A DAY 180 tablet 1  . rosuvastatin (CRESTOR) 40 MG tablet Take 1 tablet (40 mg total) by mouth at bedtime. 90 tablet 3  . zinc sulfate 220 (50 Zn) MG capsule Take 1 capsule (220 mg  total) by mouth daily. (Patient not taking: Reported on 02/18/2020) 60 capsule 0   No current facility-administered medications on file prior to visit.    Allergies  Allergen Reactions  . Amiodarone Nausea And Vomiting  . Quinolones     Patient was warned about not using Cipro and similar antibiotics. Recent studies have raised concern that fluoroquinolone antibiotics could be associated with an increased risk of aortic aneurysm Fluoroquinolones have non-antimicrobial properties that might jeopardise the integrity of the extracellular matrix of the vascular wall In a  propensity score matched cohort study in [place], there was a 66% increased rate of aortic aneurysm or dissection associated with oral fluoroquinolone use, compared wit    Past Medical History:  Diagnosis Date  . 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 Carpal tunnel syndrome 09/24/2014   Bilateral  . 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 COVID-19   . Diabetes mellitus, type 2 ([place])   . Erectile dysfunction   . Essential hypertension   . Hyperlipidemia   . Palpitations    Cardiac monitor 03/2019: normal sinus rhythm, no AF, rare NSVT, VT    Past Surgical History:  Procedure Laterality Date  . AORTIC VALVE REPLACEMENT N/A 10/29/2015   Procedure: AORTIC VALVE REPLACEMENT (AVR) WITH 23MM MAGNA EASE TISSUE VALVE.;  Surgeon: [name], MD;  Location: [facility];  Service: Open Heart Surgery;  Laterality: N/A;  . CARDIAC CATHETERIZATION N/A 10/27/2015   Procedure: Left Heart Cath and Coronary Angiography;  Surgeon: [name], MD;   Location: [facility];  Service: Cardiovascular;  Laterality: N/A;  . CORONARY ARTERY BYPASS GRAFT N/A 10/29/2015   Procedure: CORONARY ARTERY BYPASS GRAFTING (CABG) x Four UTILIZING THE LEFT INTERNAL MAMMARY ARTERY AND ENDOSCOPICALLY HARVESTED RIGHT SAPEHENEOUS VEINS.;  Surgeon: [name], MD;  Location: [facility];  Service: Open Heart Surgery;  Laterality: N/A;  . CYST REMOVAL HAND Right   . ESOPHAGOGASTRODUODENOSCOPY N/A 12/02/2015   Procedure: ESOPHAGOGASTRODUODENOSCOPY (EGD);  Surgeon: [name], MD;  Location: [facility];  Service: Gastroenterology;  Laterality: N/A;  . REPLACEMENT ASCENDING AORTA N/A 10/29/2015   Procedure: REPLACEMENT OF ASCENDING AORTA USING 34MM X 30CM WOVEN DOUBLE VELOUR VASCULAR GRAFT.;  Surgeon: [name], MD;  Location: [facility];  Service: Open Heart Surgery;  Laterality: N/A;  . TEE WITHOUT CARDIOVERSION N/A 10/29/2015   Procedure: TRANSESOPHAGEAL ECHOCARDIOGRAM (TEE);  Surgeon: [name], MD;  Location: [facility];  Service: Open Heart Surgery;  Laterality: N/A;  . UVULECTOMY N/A 12/10/2015   Procedure: UVULECTOMY;  Surgeon: [name], MD;  Location: [facility];  Service: ENT;  Laterality: N/A;    Social History   Tobacco Use  Smoking Status Never Smoker  Smokeless Tobacco Never Used    Social History   Substance and Sexual Activity  Alcohol Use Yes  . Alcohol/week: 1.0 standard drink  . Types: 1 Glasses of wine per week   Comment: 1 x month    Family History  Problem Relation Age of Onset  . Cancer Father        Throat cancer  . Diabetes Sister     Physical Exam: There were no vitals taken for this visit.  GEN:  Well nourished, well developed in no acute distress HEENT: Normal NECK: No JVD; No carotid bruits LYMPHATICS: No lymphadenopathy CARDIAC: RRR , no murmurs, rubs, gallops RESPIRATORY:  Clear to auscultation without rales, wheezing or rhonchi  ABDOMEN: Soft, non-tender, non-distended MUSCULOSKELETAL:   No edema; No deformity  SKIN: Warm and dry NEUROLOGIC:  Alert and oriented x 3     ECG:   Feb. 1, 2022:   NSR at 69.  No ST or T wave changes.     Assessment / Plan:   1. Bicupsid AV:   S/p AVR.    2.  Ascending aortic aneurism:      3. CAD -   No angian   4. Paroxysmal atrial fib:    He is maintaining normal sinus rhythm.  5. Hyperlipidemia:    He checks his labs with [name], NP  6. HTN:    Blood pressure is mildly elevated today although typically is normal at home.  He went through the similar evaluation with his nurse practitioner recently.  Instead of adding any medication at this point we will have him record his blood pressures for several weeks and then will have them send it in.  I advised him to continue to work on diet and to not eat any salty foods.  His blood pressure is mildly elevated will consider adding low-dose amlodipine.  We will have him follow-up with an APP in 6 months.  Mertie Moores, MD  03/15/2020 9:28 PM    [address],  [place], [place]  [address] Pager [phone] Phone: [phone]; Fax: [phone]

## 2020-03-16 ENCOUNTER — Ambulatory Visit: Payer: Medicare PPO | Admitting: Cardiovascular Disease

## 2020-03-16 ENCOUNTER — Other Ambulatory Visit: Payer: Self-pay

## 2020-03-16 ENCOUNTER — Encounter: Payer: Self-pay | Admitting: Cardiovascular Disease

## 2020-03-16 VITALS — BP 144/84 | HR 69 | Ht 72.0 in | Wt 182.8 lb

## 2020-03-16 DIAGNOSIS — I10 Essential (primary) hypertension: Secondary | ICD-10-CM | POA: Diagnosis not present

## 2020-03-16 DIAGNOSIS — Z952 Presence of prosthetic heart valve: Secondary | ICD-10-CM

## 2020-03-16 NOTE — Patient Instructions (Signed)
Medication Instructions:  Your physician recommends that you continue on your current medications as directed. Please refer to the Current Medication list given to you today.  *If you need a refill on your cardiac medications before your next appointment, please call your pharmacy*   Lab Work: none If you have labs (blood work) drawn today and your tests are completely normal, you will receive your results only by: . MyChart Message (if you have MyChart) OR . A paper copy in the mail If you have any lab test that is abnormal or we need to change your treatment, we will call you to review the results.   Testing/Procedures: none   Follow-Up: At [facility], you and your health needs are our priority.  As part of our continuing mission to provide you with exceptional heart care, we have created designated Provider Care Teams.  These Care Teams include your primary Cardiologist (physician) and Advanced Practice Providers (APPs -  Physician Assistants and Nurse Practitioners) who all work together to provide you with the care you need, when you need it.   Your next appointment:   6 month(s)  The format for your next appointment:   In Person  Provider:   You will see one of the following Advanced Practice Providers on your designated Care Team:    Scott Weaver, PA-C  Vin Bhagat, PA-C    

## 2020-03-18 ENCOUNTER — Other Ambulatory Visit: Payer: Self-pay | Admitting: *Deleted

## 2020-03-18 DIAGNOSIS — I712 Thoracic aortic aneurysm, without rupture, unspecified: Secondary | ICD-10-CM

## 2020-03-23 ENCOUNTER — Encounter (HOSPITAL_COMMUNITY): Payer: Self-pay | Admitting: Emergency Medicine

## 2020-03-23 ENCOUNTER — Inpatient Hospital Stay (HOSPITAL_COMMUNITY)
Admission: EM | Admit: 2020-03-23 | Discharge: 2020-03-26 | DRG: 872 | Disposition: A | Discharge status: Home / Self Care | Payer: Medicare PPO | Attending: Family Medicine | Admitting: Family Medicine

## 2020-03-23 ENCOUNTER — Emergency Department (HOSPITAL_COMMUNITY): Payer: Medicare PPO

## 2020-03-23 DIAGNOSIS — E785 Hyperlipidemia, unspecified: Secondary | ICD-10-CM | POA: Diagnosis present

## 2020-03-23 DIAGNOSIS — R6520 Severe sepsis without septic shock: Secondary | ICD-10-CM | POA: Diagnosis present

## 2020-03-23 DIAGNOSIS — Z7982 Long term (current) use of aspirin: Secondary | ICD-10-CM | POA: Diagnosis not present

## 2020-03-23 DIAGNOSIS — Z888 Allergy status to other drugs, medicaments and biological substances status: Secondary | ICD-10-CM | POA: Diagnosis not present

## 2020-03-23 DIAGNOSIS — Z951 Presence of aortocoronary bypass graft: Secondary | ICD-10-CM | POA: Diagnosis not present

## 2020-03-23 DIAGNOSIS — E1165 Type 2 diabetes mellitus with hyperglycemia: Secondary | ICD-10-CM | POA: Diagnosis present

## 2020-03-23 DIAGNOSIS — Z79899 Other long term (current) drug therapy: Secondary | ICD-10-CM | POA: Diagnosis not present

## 2020-03-23 DIAGNOSIS — K0381 Cracked tooth: Secondary | ICD-10-CM | POA: Diagnosis present

## 2020-03-23 DIAGNOSIS — Z953 Presence of xenogenic heart valve: Secondary | ICD-10-CM | POA: Diagnosis not present

## 2020-03-23 DIAGNOSIS — Z794 Long term (current) use of insulin: Secondary | ICD-10-CM | POA: Diagnosis not present

## 2020-03-23 DIAGNOSIS — E1149 Type 2 diabetes mellitus with other diabetic neurological complication: Secondary | ICD-10-CM | POA: Diagnosis present

## 2020-03-23 DIAGNOSIS — Z8616 Personal history of COVID-19: Secondary | ICD-10-CM

## 2020-03-23 DIAGNOSIS — IMO0002 Reserved for concepts with insufficient information to code with codable children: Secondary | ICD-10-CM | POA: Diagnosis present

## 2020-03-23 DIAGNOSIS — K047 Periapical abscess without sinus: Secondary | ICD-10-CM | POA: Diagnosis present

## 2020-03-23 DIAGNOSIS — Z1624 Resistance to multiple antibiotics: Secondary | ICD-10-CM | POA: Diagnosis present

## 2020-03-23 DIAGNOSIS — I480 Paroxysmal atrial fibrillation: Secondary | ICD-10-CM | POA: Diagnosis present

## 2020-03-23 DIAGNOSIS — N390 Urinary tract infection, site not specified: Secondary | ICD-10-CM | POA: Diagnosis present

## 2020-03-23 DIAGNOSIS — I2510 Atherosclerotic heart disease of native coronary artery without angina pectoris: Secondary | ICD-10-CM | POA: Diagnosis present

## 2020-03-23 DIAGNOSIS — A419 Sepsis, unspecified organism: Secondary | ICD-10-CM | POA: Diagnosis present

## 2020-03-23 DIAGNOSIS — N529 Male erectile dysfunction, unspecified: Secondary | ICD-10-CM | POA: Diagnosis present

## 2020-03-23 DIAGNOSIS — Z20822 Contact with and (suspected) exposure to covid-19: Secondary | ICD-10-CM | POA: Diagnosis present

## 2020-03-23 DIAGNOSIS — Z7984 Long term (current) use of oral hypoglycemic drugs: Secondary | ICD-10-CM | POA: Diagnosis not present

## 2020-03-23 DIAGNOSIS — I10 Essential (primary) hypertension: Secondary | ICD-10-CM | POA: Diagnosis present

## 2020-03-23 DIAGNOSIS — E782 Mixed hyperlipidemia: Secondary | ICD-10-CM | POA: Diagnosis not present

## 2020-03-23 DIAGNOSIS — Z952 Presence of prosthetic heart valve: Secondary | ICD-10-CM

## 2020-03-23 DIAGNOSIS — I4891 Unspecified atrial fibrillation: Secondary | ICD-10-CM | POA: Diagnosis present

## 2020-03-23 LAB — COMPREHENSIVE METABOLIC PANEL
ALT: 38 U/L (ref 0–44)
AST: 50 U/L — ABNORMAL HIGH (ref 15–41)
Albumin: 3.7 g/dL (ref 3.5–5.0)
Alkaline Phosphatase: 71 U/L (ref 38–126)
Anion gap: 13 (ref 5–15)
BUN: 14 mg/dL (ref 8–23)
CO2: 24 mmol/L (ref 22–32)
Calcium: 9.3 mg/dL (ref 8.9–10.3)
Chloride: 99 mmol/L (ref 98–111)
Creatinine, Ser: 1.3 mg/dL — ABNORMAL HIGH (ref 0.61–1.24)
GFR, Estimated: 58 mL/min — ABNORMAL LOW (ref >60)
Glucose, Bld: 204 mg/dL — ABNORMAL HIGH (ref 70–99)
Potassium: 3.8 mmol/L (ref 3.5–5.1)
Sodium: 136 mmol/L (ref 135–145)
Total Bilirubin: 0.8 mg/dL (ref 0.3–1.2)
Total Protein: 7.2 g/dL (ref 6.5–8.1)

## 2020-03-23 LAB — CBC WITH DIFFERENTIAL/PLATELET
Abs Immature Granulocytes: 0.17 10*3/uL — ABNORMAL HIGH (ref 0.00–0.07)
Basophils Absolute: 0.1 10*3/uL (ref 0.0–0.1)
Basophils Relative: 0 %
Eosinophils Absolute: 0 10*3/uL (ref 0.0–0.5)
Eosinophils Relative: 0 %
HCT: 37.5 % — ABNORMAL LOW (ref 39.0–52.0)
Hemoglobin: 12.3 g/dL — ABNORMAL LOW (ref 13.0–17.0)
Immature Granulocytes: 1 %
Lymphocytes Relative: 6 %
Lymphs Abs: 1.3 10*3/uL (ref 0.7–4.0)
MCH: 30.1 pg (ref 26.0–34.0)
MCHC: 32.8 g/dL (ref 30.0–36.0)
MCV: 91.7 fL (ref 80.0–100.0)
Monocytes Absolute: 0.9 10*3/uL (ref 0.1–1.0)
Monocytes Relative: 4 %
Neutro Abs: 19 10*3/uL — ABNORMAL HIGH (ref 1.7–7.7)
Neutrophils Relative %: 89 %
Platelets: 261 10*3/uL (ref 150–400)
RBC: 4.09 MIL/uL — ABNORMAL LOW (ref 4.22–5.81)
RDW: 13.4 % (ref 11.5–15.5)
WBC: 21.3 10*3/uL — ABNORMAL HIGH (ref 4.0–10.5)
nRBC: 0 % (ref 0.0–0.2)

## 2020-03-23 LAB — URINALYSIS, ROUTINE W REFLEX MICROSCOPIC
Bilirubin Urine: NEGATIVE
Glucose, UA: 150 mg/dL — AB
Ketones, ur: NEGATIVE mg/dL
Nitrite: NEGATIVE
Protein, ur: 30 mg/dL — AB
Specific Gravity, Urine: 1.021 (ref 1.005–1.030)
WBC, UA: >50 WBC/hpf — ABNORMAL HIGH (ref 0–5)
pH: 5 (ref 5.0–8.0)

## 2020-03-23 LAB — LACTIC ACID, PLASMA
Lactic Acid, Venous: 2.2 mmol/L (ref 0.5–1.9)
Lactic Acid, Venous: 1.2 mmol/L (ref 0.5–1.9)

## 2020-03-23 LAB — APTT: aPTT: 32 seconds (ref 24–36)

## 2020-03-23 LAB — PROTIME-INR
INR: 1.2 (ref 0.8–1.2)
Prothrombin Time: 14.3 seconds (ref 11.4–15.2)

## 2020-03-23 LAB — SARS CORONAVIRUS 2 (TAT 6-24 HRS): SARS Coronavirus 2: NEGATIVE

## 2020-03-23 MED ORDER — NAPROXEN 250 MG PO TABS
500.0000 mg | ORAL_TABLET | Freq: Two times a day (BID) | ORAL | Status: DC
  Administered 2020-03-24 – 2020-03-26 (×6): 500 mg via ORAL
  Filled 2020-03-23 (×7): qty 2

## 2020-03-23 MED ORDER — ENOXAPARIN SODIUM 40 MG/0.4ML ~~LOC~~ SOLN
40.0000 mg | Freq: Every day | SUBCUTANEOUS | Status: DC
  Administered 2020-03-24 – 2020-03-25 (×3): 40 mg via SUBCUTANEOUS
  Filled 2020-03-23 (×3): qty 0.4

## 2020-03-23 MED ORDER — VANCOMYCIN HCL 1500 MG/300ML IV SOLN
1500.0000 mg | INTRAVENOUS | Status: DC
  Administered 2020-03-24: 1500 mg via INTRAVENOUS
  Filled 2020-03-23: qty 300

## 2020-03-23 MED ORDER — METRONIDAZOLE IN NACL 5-0.79 MG/ML-% IV SOLN
500.0000 mg | Freq: Three times a day (TID) | INTRAVENOUS | Status: DC
  Administered 2020-03-24 – 2020-03-26 (×8): 500 mg via INTRAVENOUS
  Filled 2020-03-23 (×8): qty 100

## 2020-03-23 MED ORDER — SODIUM CHLORIDE 0.9 % IV BOLUS
500.0000 mL | Freq: Once | INTRAVENOUS | Status: AC
  Administered 2020-03-23: 500 mL via INTRAVENOUS

## 2020-03-23 MED ORDER — ONDANSETRON HCL 4 MG/2ML IJ SOLN: 4.0000 mg | Freq: Four times a day (QID) | INTRAMUSCULAR | Status: DC | PRN

## 2020-03-23 MED ORDER — ALBUTEROL SULFATE HFA 108 (90 BASE) MCG/ACT IN AERS
1.0000 | INHALATION_SPRAY | Freq: Two times a day (BID) | RESPIRATORY_TRACT | Status: DC | PRN
Start: 2020-03-23 — End: 2020-03-27
  Filled 2020-03-23: qty 6.7

## 2020-03-23 MED ORDER — METOPROLOL TARTRATE 25 MG PO TABS
25.0000 mg | ORAL_TABLET | Freq: Two times a day (BID) | ORAL | Status: DC
  Administered 2020-03-24 – 2020-03-26 (×5): 25 mg via ORAL
  Filled 2020-03-23 (×6): qty 1

## 2020-03-23 MED ORDER — LACTATED RINGERS IV SOLN: INTRAVENOUS | Status: DC

## 2020-03-23 MED ORDER — SODIUM CHLORIDE 0.9 % IV SOLN
2.0000 g | Freq: Two times a day (BID) | INTRAVENOUS | Status: DC
  Administered 2020-03-24 – 2020-03-25 (×2): 2 g via INTRAVENOUS
  Filled 2020-03-23 (×3): qty 2

## 2020-03-23 MED ORDER — ROSUVASTATIN CALCIUM 20 MG PO TABS
40.0000 mg | ORAL_TABLET | Freq: Every day | ORAL | Status: DC
Start: 2020-03-23 — End: 2020-03-27
  Administered 2020-03-24 – 2020-03-25 (×3): 40 mg via ORAL
  Filled 2020-03-23 (×3): qty 2

## 2020-03-23 MED ORDER — VANCOMYCIN HCL 1750 MG/350ML IV SOLN
1750.0000 mg | Freq: Once | INTRAVENOUS | Status: AC
  Administered 2020-03-23: 1750 mg via INTRAVENOUS
  Filled 2020-03-23: qty 350

## 2020-03-23 MED ORDER — SODIUM CHLORIDE 0.9 % IV SOLN
2.0000 g | Freq: Once | INTRAVENOUS | Status: AC
  Administered 2020-03-24: 2 g via INTRAVENOUS

## 2020-03-23 MED ORDER — INSULIN ASPART 100 UNIT/ML ~~LOC~~ SOLN: 0.0000 [IU] | Freq: Every day | SUBCUTANEOUS | Status: DC

## 2020-03-23 MED ORDER — INSULIN ASPART 100 UNIT/ML ~~LOC~~ SOLN
0.0000 [IU] | Freq: Three times a day (TID) | SUBCUTANEOUS | Status: DC
  Administered 2020-03-24: 2 [IU] via SUBCUTANEOUS
  Administered 2020-03-24: 3 [IU] via SUBCUTANEOUS
  Administered 2020-03-26: 2 [IU] via SUBCUTANEOUS

## 2020-03-23 MED ORDER — ASPIRIN EC 81 MG PO TBEC: 81.0000 mg | DELAYED_RELEASE_TABLET | ORAL | Status: DC

## 2020-03-23 MED ORDER — SODIUM CHLORIDE 0.9 % IV SOLN
2.0000 g | Freq: Two times a day (BID) | INTRAVENOUS | Status: DC
  Administered 2020-03-23: 2 g via INTRAVENOUS
  Filled 2020-03-23: qty 20

## 2020-03-23 MED ORDER — VANCOMYCIN HCL 1000 MG/200ML IV SOLN
1000.0000 mg | Freq: Once | INTRAVENOUS | Status: DC
Start: 2020-03-23 — End: 2020-03-23

## 2020-03-23 MED ORDER — ONDANSETRON HCL 4 MG PO TABS
4.0000 mg | ORAL_TABLET | Freq: Four times a day (QID) | ORAL | Status: DC | PRN
  Administered 2020-03-24: 4 mg via ORAL
  Filled 2020-03-23: qty 1

## 2020-03-23 MED ORDER — SODIUM CHLORIDE 0.9 % IV SOLN: 2.0000 g | Freq: Two times a day (BID) | INTRAVENOUS | Status: DC

## 2020-03-23 MED ORDER — INSULIN GLARGINE 100 UNIT/ML ~~LOC~~ SOLN
32.0000 [IU] | Freq: Every day | SUBCUTANEOUS | Status: DC
  Administered 2020-03-24: 32 [IU] via SUBCUTANEOUS
  Filled 2020-03-23 (×2): qty 0.32

## 2020-03-23 NOTE — H&P (Signed)
History and Physical   [name] KXF:[id number] DOB: 10-Mar-1945 DOA: 03/23/2020  Referring MD/NP/PA: Dr. Karle Starch  PCP: [name], NP   Patient coming from: Home  Chief Complaint: Fever aches nausea and vomiting  HPI: [name] is a 75 y.o. male with medical history significant of aortic stenosis status post post pericardial AVR in 2017, coronary artery disease with CABG also in 2017, diabetes recent COVID-19 infection, diabetes, essential hypertension, hyperlipidemia who presents to the ER with fever cough vomiting and generalized body aches for the last 1 week.  He also complained of some dysuria.  Symptoms have been going on for about a week.  Came to the ER where he was was evaluated.  Patient woke up this morning with severe fever and chills.  He met sepsis criteria.  No obvious cause except possible urinalysis showing abnormalities.  Patient reported recent dental work and infection.  He was treated last week with antibiotics but did not complete it because his gums were feeling better.  No active cough in the morning and no shortness of breath.  He has a murmur not clear if this is worse from his previous murmur.  There is worrisome that he may have endocarditis as a cause of his sepsis.  He is being admitted for further evaluation and treatment.  ED Course: Temperature 103.1 blood pressure 107/54 pulse 102 respirate 24 oxygen sats 97% room air.  White count 21.3 hemoglobin 12.3 and platelets 262.  Creatinine 1.30 otherwise chemistry within normal.  Lactic acid 2.2.  Glucose 204 creatinine 1.30.  Chest x-ray showed normal.  Urinalysis showed WBC more than 50 bacteria many some blood and small leukocytes.  Patient been admitted with sepsis most likely due to UTI although endocarditis suspected if blood cultures to come back positive.  Review of Systems: As per HPI otherwise 10 point review of systems negative.    Past Medical History:  Diagnosis Date  . Aortic stenosis     Status post pericardial AVR September 2017  . Carpal tunnel syndrome 09/24/2014   Bilateral  . 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 COVID-19   . Diabetes mellitus, type 2 ([place])   . Erectile dysfunction   . Essential hypertension   . Hyperlipidemia   . Palpitations    Cardiac monitor 03/2019: normal sinus rhythm, no AF, rare NSVT, VT    Past Surgical History:  Procedure Laterality Date  . AORTIC VALVE REPLACEMENT N/A 10/29/2015   Procedure: AORTIC VALVE REPLACEMENT (AVR) WITH 23MM MAGNA EASE TISSUE VALVE.;  Surgeon: [name], MD;  Location: [facility];  Service: Open Heart Surgery;  Laterality: N/A;  . CARDIAC CATHETERIZATION N/A 10/27/2015   Procedure: Left Heart Cath and Coronary Angiography;  Surgeon: [name], MD;  Location: [facility];  Service: Cardiovascular;  Laterality: N/A;  . CORONARY ARTERY BYPASS GRAFT N/A 10/29/2015   Procedure: CORONARY ARTERY BYPASS GRAFTING (CABG) x Four UTILIZING THE LEFT INTERNAL MAMMARY ARTERY AND ENDOSCOPICALLY HARVESTED RIGHT SAPEHENEOUS VEINS.;  Surgeon: [name], MD;  Location: [facility];  Service: Open Heart Surgery;  Laterality: N/A;  . CYST REMOVAL HAND Right   . ESOPHAGOGASTRODUODENOSCOPY N/A 12/02/2015   Procedure: ESOPHAGOGASTRODUODENOSCOPY (EGD);  Surgeon: [name], MD;  Location: [facility];  Service: Gastroenterology;  Laterality: N/A;  . REPLACEMENT ASCENDING AORTA N/A 10/29/2015   Procedure: REPLACEMENT OF ASCENDING AORTA USING 34MM X 30CM WOVEN DOUBLE VELOUR VASCULAR GRAFT.;  Surgeon: Lilia Argue  Servando Snare, MD;  Location: [facility];  Service: Open Heart Surgery;  Laterality: N/A;  . TEE WITHOUT CARDIOVERSION N/A 10/29/2015   Procedure: TRANSESOPHAGEAL ECHOCARDIOGRAM (TEE);  Surgeon: [name], MD;  Location: [facility];  Service: Open Heart Surgery;  Laterality: N/A;  . UVULECTOMY N/A 12/10/2015   Procedure: UVULECTOMY;  Surgeon: [name], MD;  Location: [facility];   Service: ENT;  Laterality: N/A;     reports that he has never smoked. He has never used smokeless tobacco. He reports current alcohol use of about 1.0 standard drink of alcohol per week. He reports that he does not use drugs.  Allergies  Allergen Reactions  . Amiodarone Nausea And Vomiting  . Quinolones     Patient was warned about not using Cipro and similar antibiotics. Recent studies have raised concern that fluoroquinolone antibiotics could be associated with an increased risk of aortic aneurysm Fluoroquinolones have non-antimicrobial properties that might jeopardise the integrity of the extracellular matrix of the vascular wall In a  propensity score matched cohort study in [place], there was a 66% increased rate of aortic aneurysm or dissection associated with oral fluoroquinolone use, compared wit    Family History  Problem Relation Age of Onset  . Cancer Father        Throat cancer  . Diabetes Sister      Prior to Admission medications   Medication Sig Start Date End Date Taking? Authorizing Provider  albuterol (VENTOLIN HFA) 108 (90 Base) MCG/ACT inhaler Inhale 1-2 puffs into the lungs 2 (two) times daily as needed for wheezing or shortness of breath. 02/23/19   Kayleen Memos, DO  aspirin EC 81 MG tablet Take 81 mg by mouth daily.    [provider]  Blood Glucose Monitoring Suppl (ACCU-CHEK GUIDE ME) w/Device KIT 1 Stick by Does not apply route 3 (three) times daily. Used to check blood sugar 3 times daily 03/06/19   Nafziger, Tommi Rumps, NP  glipiZIDE (GLUCOTROL XL) 10 MG 24 hr tablet Take 1 tablet (10 mg total) by mouth daily with breakfast. 11/06/19   Nafziger, Tommi Rumps, NP  glucose blood (ACCU-CHEK GUIDE) test strip Use as instructed 03/05/19   Nafziger, Tommi Rumps, NP  hydrochlorothiazide (HYDRODIURIL) 25 MG tablet Take 1 tablet (25 mg total) by mouth daily. 11/06/19   Nafziger, Tommi Rumps, NP  insulin glargine (LANTUS) 100 UNIT/ML Solostar Pen Inject 32 Units into the skin at bedtime.     [provider]  Insulin Pen Needle (B-D ULTRAFINE III SHORT PEN) 31G X 8 MM MISC Use QHS 07/30/19   Nafziger, Tommi Rumps, NP  metFORMIN (GLUCOPHAGE) 1000 MG tablet Take 1 tablet (1,000 mg total) by mouth 2 (two) times daily with a meal. 11/06/19   Nafziger, Tommi Rumps, NP  metoprolol tartrate (LOPRESSOR) 25 MG tablet TAKE 1 TABLET BY MOUTH TWICE A DAY 03/01/20   [name], [name], MD  naproxen (NAPROSYN) 500 MG tablet Take 1 tablet by mouth as directed. 03/12/20 03/12/21  [provider]  rosuvastatin (CRESTOR) 40 MG tablet Take 1 tablet (40 mg total) by mouth at bedtime. 11/07/19   [name], NP    Physical Exam: Vitals:   03/23/20 1900 03/23/20 1930 03/23/20 2000 03/23/20 2030  BP: 121/63 117/64 127/63 (!) 117/59  Pulse: 69 67 70 63  Resp: (!) 22 (!) '21 20 15  ' Temp:      TempSrc:      SpO2: 98% 98% 100% 100%      Constitutional: Acutely ill looking no distress Vitals:  03/23/20 1900 03/23/20 1930 03/23/20 2000 03/23/20 2030  BP: 121/63 117/64 127/63 (!) 117/59  Pulse: 69 67 70 63  Resp: (!) 22 (!) '21 20 15  ' Temp:      TempSrc:      SpO2: 98% 98% 100% 100%   Eyes: PERRL, lids and conjunctivae normal ENMT: Mucous membranes are dry. Posterior pharynx clear of any exudate or lesions.Normal dentition.  Neck: normal, supple, no masses, no thyromegaly Respiratory: clear to auscultation bilaterally, no wheezing, no crackles. Normal respiratory effort. No accessory muscle use.  Cardiovascular: Regular rate and rhythm, diffuse systolic murmur with a click/ rubs / gallops. No extremity edema. 2+ pedal pulses. No carotid bruits.  Abdomen: no tenderness, no masses palpated. No hepatosplenomegaly. Bowel sounds positive.  Musculoskeletal: no clubbing / cyanosis. No joint deformity upper and lower extremities. Good ROM, no contractures. Normal muscle tone.  Skin: no rashes, lesions, ulcers. No induration Neurologic: CN 2-12 grossly intact. Sensation intact, DTR normal. Strength 5/5 in  all 4.  Psychiatric: Normal judgment and insight. Alert and oriented x 3. Normal mood.     Labs on Admission: I have personally reviewed following labs and imaging studies  CBC: Recent Labs  Lab 03/23/20 1311  WBC 21.3*  NEUTROABS 19.0*  HGB 12.3*  HCT 37.5*  MCV 91.7  PLT 952   Basic Metabolic Panel: Recent Labs  Lab 03/23/20 1311  NA 136  K 3.8  CL 99  CO2 24  GLUCOSE 204*  BUN 14  CREATININE 1.30*  CALCIUM 9.3   GFR: Estimated Creatinine Clearance: 56.4 mL/min (A) (by C-G formula based on SCr of 1.3 mg/dL (H)). Liver Function Tests: Recent Labs  Lab 03/23/20 1311  AST 50*  ALT 38  ALKPHOS 71  BILITOT 0.8  PROT 7.2  ALBUMIN 3.7   No results for input(s): LIPASE, AMYLASE in the last 168 hours. No results for input(s): AMMONIA in the last 168 hours. Coagulation Profile: Recent Labs  Lab 03/23/20 1522  INR 1.2   Cardiac Enzymes: No results for input(s): CKTOTAL, CKMB, CKMBINDEX, TROPONINI in the last 168 hours. BNP (last 3 results) No results for input(s): PROBNP in the last 8760 hours. HbA1C: No results for input(s): HGBA1C in the last 72 hours. CBG: No results for input(s): GLUCAP in the last 168 hours. Lipid Profile: No results for input(s): CHOL, HDL, LDLCALC, TRIG, CHOLHDL, LDLDIRECT in the last 72 hours. Thyroid Function Tests: No results for input(s): TSH, T4TOTAL, FREET4, T3FREE, THYROIDAB in the last 72 hours. Anemia Panel: No results for input(s): VITAMINB12, FOLATE, FERRITIN, TIBC, IRON, RETICCTPCT in the last 72 hours. Urine analysis:    Component Value Date/Time   COLORURINE YELLOW 03/23/2020 1435   APPEARANCEUR HAZY (A) 03/23/2020 1435   LABSPEC 1.021 03/23/2020 1435   PHURINE 5.0 03/23/2020 1435   GLUCOSEU 150 (A) 03/23/2020 1435   GLUCOSEU NEGATIVE 04/06/2010 1554   HGBUR SMALL (A) 03/23/2020 1435   BILIRUBINUR NEGATIVE 03/23/2020 1435   BILIRUBINUR N 03/24/2016 0905   KETONESUR NEGATIVE 03/23/2020 1435   PROTEINUR 30 (A)  03/23/2020 1435   UROBILINOGEN 0.2 03/24/2016 0905   UROBILINOGEN 1.0 04/03/2014 0058   NITRITE NEGATIVE 03/23/2020 1435   LEUKOCYTESUR SMALL (A) 03/23/2020 1435   Sepsis Labs: '@LABRCNTIP' (procalcitonin:4,lacticidven:4) ) Recent Results (from the past 240 hour(s))  SARS CORONAVIRUS 2 (TAT 6-24 HRS) Nasopharyngeal Nasopharyngeal Swab     Status: None   Collection Time: 03/23/20  1:46 PM   Specimen: Nasopharyngeal Swab  Result Value Ref Range Status  SARS Coronavirus 2 NEGATIVE NEGATIVE Final    Comment: (NOTE) SARS-CoV-2 target nucleic acids are NOT DETECTED.  The SARS-CoV-2 RNA is generally detectable in upper and lower respiratory specimens during the acute phase of infection. Negative results do not preclude SARS-CoV-2 infection, do not rule out co-infections with other pathogens, and should not be used as the sole basis for treatment or other patient management decisions. Negative results must be combined with clinical observations, patient history, and epidemiological information. The expected result is Negative.  Fact Sheet for Patients: SugarRoll.be  Fact Sheet for Healthcare Providers: https://www.woods-mathews.com/  This test is not yet approved or cleared by the [place] FDA and  has been authorized for detection and/or diagnosis of SARS-CoV-2 by FDA under an Emergency Use Authorization (EUA). This EUA will remain  in effect (meaning this test can be used) for the duration of the COVID-19 declaration under Se ction 564(b)(1) of the Act, 21 U.S.C. section 360bbb-3(b)(1), unless the authorization is terminated or revoked sooner.  Performed at [facility], [place] [address], [place], [place] [address]      Radiological Exams on Admission: DG Chest Port 1 View  Result Date: 03/23/2020 CLINICAL DATA:  Cough, fever, body aches, chills EXAM: PORTABLE CHEST 1 VIEW COMPARISON:  03/02/2019 FINDINGS: Single frontal view of  the chest demonstrates postsurgical changes from bypass surgery and aortic valve replacement. Cardiac silhouette is unremarkable. No acute airspace disease, effusion, or pneumothorax. Minimal scarring within the left mid chest is stable. No acute bony abnormalities. IMPRESSION: 1. Stable postsurgical changes.  No acute intrathoracic process. Electronically Signed   By: Randa Ngo M.D.   On: 03/23/2020 15:08     Assessment/Plan Principal Problem:   Sepsis ([place]) Active Problems:   Type II diabetes mellitus with neurological manifestations, uncontrolled ([place])   Hyperlipidemia   Essential hypertension   Hx of CABG (Sept 2017)   Atrial fibrillation (post op after CABG Sept 2017)   PAF (paroxysmal atrial fibrillation) ([facility])   S/P AVR (aortic valve replacement)     #1 sepsis: Most likely due to UTI.  Worried about endocarditis.  Will initiate sepsis protocol.  Will get blood cultures if positive will evaluate for endocarditis with echo and possible TEE.  We will follow urine culture and sensitivities.  #2 diabetes: Continue sliding scale insulin and Lantus.  #3 essential hypertension: Continue blood pressure medications.  #4 hyperlipidemia: Continue statin  #5 status post CABG: Continue on telemetry Maitri.  #6 status post AVR: Stable and continue monitor  #7 paroxysmal atrial fibrillation: Continue telemetry Maitri on rate control.   DVT prophylaxis: Heparin Code Status: Full code Family Communication: No family at bedside Disposition Plan: Home Consults called: None Admission status: Inpatient  Severity of Illness: The appropriate patient status for this patient is INPATIENT. Inpatient status is judged to be reasonable and necessary in order to provide the required intensity of service to ensure the patient's safety. The patient's presenting symptoms, physical exam findings, and initial radiographic and laboratory data in the context of their chronic comorbidities is felt to  place them at high risk for further clinical deterioration. Furthermore, it is not anticipated that the patient will be medically stable for discharge from the hospital within 2 midnights of admission. The following factors support the patient status of inpatient.   " The patient's presenting symptoms include fever and chills. " The worrisome physical exam findings include dry mucous membrane and evidence of sepsis. " The initial radiographic and laboratory data are worrisome  because of evidence of UTI. " The chronic co-morbidities include coronary artery disease.   * I certify that at the point of admission it is my clinical judgment that the patient will require inpatient hospital care spanning beyond 2 midnights from the point of admission due to high intensity of service, high risk for further deterioration and high frequency of surveillance required.Barbette Merino MD [facility] Pager [phone]  If 7PM-7AM, please contact night-coverage [email or web address] Password [email or web address]  03/23/2020, 10:00 PM

## 2020-03-23 NOTE — ED Provider Notes (Signed)
[facility] EMERGENCY DEPARTMENT Provider Note   CSN: [id number] Arrival date & time: 03/23/20  1300     History Chief Complaint  Patient presents with  . Fever  . Generalized Body Aches  . Cough  . Vomiting    [name] is a 76 y.o. male possible history of aortic valve replacement, CAD, diabetes, hypertension who presents for evaluation of fevers, generalized body aches, chills, nausea/vomiting.  He reports that for last few days, he has felt tired, fatigued.  He states "just not myself."  He is also noted some dysuria.  He has not had any hematuria.  He reports that this morning he woke up and he had severe fever and chills.  He states he did not measure his temperature but he felt like he had a fever.  He then started having some nausea/vomiting and called EMS.  He reports that he has had some decreased appetite over the last day or so but is not any abdominal pain.  Today was the first time he had vomiting.  He has been vaccinated for COVID x3.  No Covid exposure that he knows of.  He does report that he has had some soreness around his incision site on his chest but no actual chest pain.  He reports that last week he was treated for a dental infection.  He was started on antibiotics but he states that when he started feeling like his gum was getting better, he stopped taking the antibiotics.  He denies any cough, shortness of breath, abdominal pain, hematuria.  The history is provided by the patient.       Past Medical History:  Diagnosis Date  . 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 Carpal tunnel syndrome 09/24/2014   Bilateral  . 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 COVID-19   . Diabetes mellitus, type 2 ([place])   . Erectile dysfunction   . Essential hypertension   . Hyperlipidemia   . Palpitations    Cardiac monitor 03/2019: normal sinus rhythm, no AF, rare NSVT, VT    Patient Active  Problem List   Diagnosis Date Noted  . Pneumonia due to COVID-19 virus 02/15/2019  . S/P AVR (aortic valve replacement) 11/14/2016  . 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 Uvular swelling 12/06/2015  . Tibial plateau fracture, left, closed, initial encounter 12/02/2015  . Coronary artery disease involving coronary bypass graft of native heart without angina pectoris   . PAF (paroxysmal atrial fibrillation) ([place]) 11/24/2015  . Orthostatic hypotension   . Fungus present in urine   . Hx of CABG (Sept 2017) 11/13/2015  . Atrial fibrillation (post op after CABG Sept 2017) 11/13/2015  . Orthostatic syncope 11/13/2015  . Elevated troponin 11/13/2015  . Coronary artery disease involving native coronary artery of native heart without angina pectoris   . Aortic stenosis s/p tissue valve (Sept 2017) 10/28/2015  . Atypical angina ([place]) 10/27/2015  . Abnormal nuclear stress test 10/27/2015  . Coronary artery disease involving native heart with angina pectoris ([place]) 10/27/2015  . Aortic aneurysm ([place]) 07/23/2015  . Carpal tunnel syndrome 09/24/2014  . Left carotid bruit 05/07/2013  . Diabetic retinopathy ([place]) 09/23/2012  . Diminished pulses in lower extremity 06/17/2012  . Chest pain 03/22/2011  . Paresthesia of foot 11/04/2010  . Aortic valve disease 04/06/2010  . HYPERTROPHY PROSTATE W/UR OBST & OTH LUTS 03/15/2009  .  Hyperlipidemia 12/13/2007  . Type II diabetes mellitus with neurological manifestations, uncontrolled ([place]) 11/29/2007  . ERECTILE DYSFUNCTION 11/29/2007  . Essential hypertension 11/29/2007    Past Surgical History:  Procedure Laterality Date  . AORTIC VALVE REPLACEMENT N/A 10/29/2015   Procedure: AORTIC VALVE REPLACEMENT (AVR) WITH 23MM MAGNA EASE TISSUE VALVE.;  Surgeon: [name], MD;  Location: [facility];  Service: Open Heart Surgery;  Laterality: N/A;  . CARDIAC CATHETERIZATION N/A 10/27/2015   Procedure: Left Heart Cath and Coronary  Angiography;  Surgeon: [name], MD;  Location: [facility];  Service: Cardiovascular;  Laterality: N/A;  . CORONARY ARTERY BYPASS GRAFT N/A 10/29/2015   Procedure: CORONARY ARTERY BYPASS GRAFTING (CABG) x Four UTILIZING THE LEFT INTERNAL MAMMARY ARTERY AND ENDOSCOPICALLY HARVESTED RIGHT SAPEHENEOUS VEINS.;  Surgeon: [name], MD;  Location: [facility];  Service: Open Heart Surgery;  Laterality: N/A;  . CYST REMOVAL HAND Right   . ESOPHAGOGASTRODUODENOSCOPY N/A 12/02/2015   Procedure: ESOPHAGOGASTRODUODENOSCOPY (EGD);  Surgeon: [name], MD;  Location: [facility];  Service: Gastroenterology;  Laterality: N/A;  . REPLACEMENT ASCENDING AORTA N/A 10/29/2015   Procedure: REPLACEMENT OF ASCENDING AORTA USING 34MM X 30CM WOVEN DOUBLE VELOUR VASCULAR GRAFT.;  Surgeon: [name], MD;  Location: [facility];  Service: Open Heart Surgery;  Laterality: N/A;  . TEE WITHOUT CARDIOVERSION N/A 10/29/2015   Procedure: TRANSESOPHAGEAL ECHOCARDIOGRAM (TEE);  Surgeon: [name], MD;  Location: [facility];  Service: Open Heart Surgery;  Laterality: N/A;  . UVULECTOMY N/A 12/10/2015   Procedure: UVULECTOMY;  Surgeon: [name], MD;  [facility];  Service: ENT;  Laterality: N/A;       Family History  Problem Relation Age of Onset  . Cancer Father        Throat cancer  . Diabetes Sister     Social History   Tobacco Use  . Smoking status: Never Smoker  . Smokeless tobacco: Never Used  Substance Use Topics  . Alcohol use: Yes    Alcohol/week: 1.0 standard drink    Types: 1 Glasses of wine per week    Comment: 1 x month  . Drug use: No    Home Medications Prior to Admission medications   Medication Sig Start Date End Date Taking? Authorizing Provider  albuterol (VENTOLIN HFA) 108 (90 Base) MCG/ACT inhaler Inhale 1-2 puffs into the lungs 2 (two) times daily as needed for wheezing or shortness of breath. 02/23/19   Kayleen Memos, DO  aspirin EC 81 MG tablet  Take 81 mg by mouth daily.    [provider]  Blood Glucose Monitoring Suppl (ACCU-CHEK GUIDE ME) w/Device KIT 1 Stick by Does not apply route 3 (three) times daily. Used to check blood sugar 3 times daily 03/06/19   Nafziger, Tommi Rumps, NP  glipiZIDE (GLUCOTROL XL) 10 MG 24 hr tablet Take 1 tablet (10 mg total) by mouth daily with breakfast. 11/06/19   Nafziger, Tommi Rumps, NP  glucose blood (ACCU-CHEK GUIDE) test strip Use as instructed 03/05/19   Nafziger, Tommi Rumps, NP  hydrochlorothiazide (HYDRODIURIL) 25 MG tablet Take 1 tablet (25 mg total) by mouth daily. 11/06/19   Nafziger, Tommi Rumps, NP  insulin glargine (LANTUS) 100 UNIT/ML Solostar Pen Inject 32 Units into the skin at bedtime.    [provider]  Insulin Pen Needle (B-D ULTRAFINE III SHORT PEN) 31G X 8 MM MISC Use QHS 07/30/19   Nafziger, Tommi Rumps, NP  metFORMIN (GLUCOPHAGE) 1000 MG tablet Take 1 tablet (1,000 mg  total) by mouth 2 (two) times daily with a meal. 11/06/19   Nafziger, Tommi Rumps, NP  metoprolol tartrate (LOPRESSOR) 25 MG tablet TAKE 1 TABLET BY MOUTH TWICE A DAY 03/01/20   [name], [name], MD  naproxen (NAPROSYN) 500 MG tablet Take 1 tablet by mouth as directed. 03/12/20 03/12/21  [provider]  rosuvastatin (CRESTOR) 40 MG tablet Take 1 tablet (40 mg total) by mouth at bedtime. 11/07/19   Nafziger, Tommi Rumps, NP    Allergies    Amiodarone and Quinolones  Review of Systems   Review of Systems  Constitutional: Positive for chills, fatigue and fever.  Respiratory: Negative for cough and shortness of breath.   Cardiovascular: Negative for chest pain.  Gastrointestinal: Positive for nausea and vomiting. Negative for abdominal pain.  Genitourinary: Positive for dysuria. Negative for hematuria.  Neurological: Negative for headaches.  All other systems reviewed and are negative.   Physical Exam Updated Vital Signs BP 119/61   Pulse 72   Temp 99.8 F (37.7 C) (Oral)   Resp 20   SpO2 98%   Physical Exam Vitals and nursing note  reviewed.  Constitutional:      Appearance: Normal appearance. He is well-developed and well-nourished.  HENT:     Head: Normocephalic and atraumatic.     Comments: Small scabbed region noted to the right lower gum.  No obvious abscess.  Face is symmetric in appearance without any overlying warmth, erythema.    Mouth/Throat:     Mouth: Oropharynx is clear and moist and mucous membranes are normal.  Eyes:     General: Lids are normal.     Extraocular Movements: EOM normal.     Conjunctiva/sclera: Conjunctivae normal.     Pupils: Pupils are equal, round, and reactive to light.  Cardiovascular:     Rate and Rhythm: Regular rhythm. Tachycardia present.     Pulses: Normal pulses.     Heart sounds: Murmur heard.   Systolic murmur is present. No friction rub. No gallop.   Pulmonary:     Effort: Pulmonary effort is normal.     Breath sounds: Normal breath sounds.     Comments: Lungs clear to auscultation bilaterally.  Symmetric chest rise.  No wheezing, rales, rhonchi. Abdominal:     Palpations: Abdomen is soft. Abdomen is not rigid.     Tenderness: There is no abdominal tenderness. There is no guarding.     Comments: Abdomen is soft, non-distended, non-tender. No rigidity, No guarding. No peritoneal signs.  Musculoskeletal:        General: Normal range of motion.     Cervical back: Full passive range of motion without pain.  Skin:    General: Skin is warm and dry.     Capillary Refill: Capillary refill takes less than 2 seconds.     Comments: No Janeway lesions.  No Osler nodes.  Neurological:     Mental Status: He is alert and oriented to person, place, and time.  Psychiatric:        Mood and Affect: Mood and affect normal.        Speech: Speech normal.     ED Results / Procedures / Treatments   Labs (all labs ordered are listed, but only abnormal results are displayed) Labs Reviewed  COMPREHENSIVE METABOLIC PANEL - Abnormal; Notable for the following components:      Result  Value   Glucose, Bld 204 (*)    Creatinine, Ser 1.30 (*)    AST 50 (*)  GFR, Estimated 58 (*)    All other components within normal limits  CBC WITH DIFFERENTIAL/PLATELET - Abnormal; Notable for the following components:   WBC 21.3 (*)    RBC 4.09 (*)    Hemoglobin 12.3 (*)    HCT 37.5 (*)    Neutro Abs 19.0 (*)    Abs Immature Granulocytes 0.17 (*)    All other components within normal limits  SARS CORONAVIRUS 2 (TAT 6-24 HRS)  CULTURE, BLOOD (SINGLE)  URINE CULTURE  CULTURE, BLOOD (SINGLE)  LACTIC ACID, PLASMA  LACTIC ACID, PLASMA  PROTIME-INR  APTT  URINALYSIS, ROUTINE W REFLEX MICROSCOPIC    EKG EKG Interpretation  Date/Time:  Tuesday March 23 2020 15:05:51 EST Ventricular Rate:  75 PR Interval:    QRS Duration: 103 QT Interval:  378 QTC Calculation: 423 R Axis:   48 Text Interpretation: Sinus rhythm Anterior infarct, old No significant change since last tracing Confirmed by Calvert Cantor [phone]) on 03/23/2020 3:08:15 PM   Radiology DG Chest Port 1 View  Result Date: 03/23/2020 CLINICAL DATA:  Cough, fever, body aches, chills EXAM: PORTABLE CHEST 1 VIEW COMPARISON:  03/02/2019 FINDINGS: Single frontal view of the chest demonstrates postsurgical changes from bypass surgery and aortic valve replacement. Cardiac silhouette is unremarkable. No acute airspace disease, effusion, or pneumothorax. Minimal scarring within the left mid chest is stable. No acute bony abnormalities. IMPRESSION: 1. Stable postsurgical changes.  No acute intrathoracic process. Electronically Signed   By: Randa Ngo M.D.   On: 03/23/2020 15:08    Procedures .Critical Care Performed by: Volanda Napoleon, PA-C Authorized by: Volanda Napoleon, PA-C   Critical care provider statement:    Critical care time (minutes):  35   Critical care was necessary to treat or prevent imminent or life-threatening deterioration of the following conditions:  Sepsis   Critical care was time spent  personally by me on the following activities:  Discussions with consultants, evaluation of patient's response to treatment, examination of patient, ordering and performing treatments and interventions, ordering and review of laboratory studies, ordering and review of radiographic studies, pulse oximetry, re-evaluation of patient's condition, obtaining history from patient or surrogate and review of old charts     Medications Ordered in ED Medications  cefTRIAXone (ROCEPHIN) 2 g in sodium chloride 0.9 % 100 mL IVPB (has no administration in time range)    ED Course  I have reviewed the triage vital signs and the nursing notes.  Pertinent labs & imaging results that were available during my care of the patient were reviewed by me and considered in my medical decision making (see chart for details).    MDM Rules/Calculators/A&P                          75 year old male past medical history of aortic valve replacement, CAD, diabetes, hypertension who presents for evaluation of fevers, chills, fatigue, body ache.  Reports that symptoms have been ongoing for about a day or so but states that today, everything got worse.  On initial arrival, he is febrile, tachycardic.  Vitals otherwise stable.  On exam, he has no abdominal tenderness.  There is a systolic murmur noted.  Given his history of aortic valve replacement as well as new murmur and recent dental infection, concern for possible endocarditis.  Additionally, given his febrile, tachycardic, concern for sepsis. Code sepsis initiated.  CBC shows leukocytosis of 21.3.  CMP shows BUN of 14, creatinine of 1.30.  Given concerns of endocarditis, broad-spectrum antibiotics were initiated.  I discussed with pharmacy who recommended Rocephin every 12 12 hours as well as Vanco.  Patient signed out to [place] Robinson, PA-C pending labs.   [name] was evaluated in Emergency Department on 03/23/2020 for the symptoms described in the history of  present illness. He was evaluated in the context of the global COVID-19 pandemic, which necessitated consideration that the patient might be at risk for infection with the SARS-CoV-2 virus that causes COVID-19. Institutional protocols and algorithms that pertain to the evaluation of patients at risk for COVID-19 are in a state of rapid change based on information released by regulatory bodies including the CDC and federal and state organizations. These policies and algorithms were followed during the patient's care in the ED.  Portions of this note were generated with [employer]. Dictation errors may occur despite best attempts at proofreading.   Final Clinical Impression(s) / ED Diagnoses Final diagnoses:  Sepsis, due to unspecified organism, unspecified whether acute organ dysfunction present [facility])    Rx / DC Orders ED Discharge Orders    None       Desma Mcgregor 03/23/20 1554    Little, Wenda Overland, MD 03/29/20 [phone]

## 2020-03-23 NOTE — ED Provider Notes (Signed)
Care assumed at shift change from [place], [place], pending admission. See their note for full HPI and workup. Briefly, pt presenting with fevers, chills, vomiting that began today.  Also with fatigue and some mild dysuria. Hx aortic valve replacement, recent dental infxn did not finish abx. New murmur, strong concern for endocarditis.  Leukocytosis is present.  He is febrile on arrival.  Rocephin and vanc to cover endocarditis. Needs admit for echo, etc. UA pending. Physical Exam  BP 119/61   Pulse 72   Temp 99.8 F (37.7 C) (Oral)   Resp 20   SpO2 98%   Physical Exam Vitals and nursing note reviewed.  Constitutional:      General: He is not in acute distress.    Appearance: He is well-developed.  HENT:     Head: Normocephalic and atraumatic.  Eyes:     Conjunctiva/sclera: Conjunctivae normal.  Cardiovascular:     Rate and Rhythm: Normal rate.  Pulmonary:     Effort: Pulmonary effort is normal.  Neurological:     Mental Status: He is alert.  Psychiatric:        Mood and Affect: Mood normal.        Behavior: Behavior normal.     ED Course/Procedures     Procedures Results for orders placed or performed during the hospital encounter of 03/23/20  SARS CORONAVIRUS 2 (TAT 6-24 HRS) Nasopharyngeal Nasopharyngeal Swab   Specimen: Nasopharyngeal Swab  Result Value Ref Range   SARS Coronavirus 2 NEGATIVE NEGATIVE  Comprehensive metabolic panel  Result Value Ref Range   Sodium 136 135 - 145 mmol/L   Potassium 3.8 3.5 - 5.1 mmol/L   Chloride 99 98 - 111 mmol/L   CO2 24 22 - 32 mmol/L   Glucose, Bld 204 (H) 70 - 99 mg/dL   BUN 14 8 - 23 mg/dL   Creatinine, Ser 1.30 (H) 0.61 - 1.24 mg/dL   Calcium 9.3 8.9 - 10.3 mg/dL   Total Protein 7.2 6.5 - 8.1 g/dL   Albumin 3.7 3.5 - 5.0 g/dL   AST 50 (H) 15 - 41 U/L   ALT 38 0 - 44 U/L   Alkaline Phosphatase 71 38 - 126 U/L   Total Bilirubin 0.8 0.3 - 1.2 mg/dL   GFR, Estimated 58 (L) >60 mL/min   Anion gap 13 5 - 15  CBC with  Differential  Result Value Ref Range   WBC 21.3 (H) 4.0 - 10.5 K/uL   RBC 4.09 (L) 4.22 - 5.81 MIL/uL   Hemoglobin 12.3 (L) 13.0 - 17.0 g/dL   HCT 37.5 (L) 39.0 - 52.0 %   MCV 91.7 80.0 - 100.0 fL   MCH 30.1 26.0 - 34.0 pg   MCHC 32.8 30.0 - 36.0 g/dL   RDW 13.4 11.5 - 15.5 %   Platelets 261 150 - 400 K/uL   nRBC 0.0 0.0 - 0.2 %   Neutrophils Relative % 89 %   Neutro Abs 19.0 (H) 1.7 - 7.7 K/uL   Lymphocytes Relative 6 %   Lymphs Abs 1.3 0.7 - 4.0 K/uL   Monocytes Relative 4 %   Monocytes Absolute 0.9 0.1 - 1.0 K/uL   Eosinophils Relative 0 %   Eosinophils Absolute 0.0 0.0 - 0.5 K/uL   Basophils Relative 0 %   Basophils Absolute 0.1 0.0 - 0.1 K/uL   Immature Granulocytes 1 %   Abs Immature Granulocytes 0.17 (H) 0.00 - 0.07 K/uL  Lactic acid, plasma  Result Value Ref Range  Lactic Acid, Venous 2.2 (HH) 0.5 - 1.9 mmol/L  Lactic acid, plasma  Result Value Ref Range   Lactic Acid, Venous 1.2 0.5 - 1.9 mmol/L  Protime-INR  Result Value Ref Range   Prothrombin Time 14.3 11.4 - 15.2 seconds   INR 1.2 0.8 - 1.2  APTT  Result Value Ref Range   aPTT 32 24 - 36 seconds  Urinalysis, Routine w reflex microscopic Urine, Clean Catch  Result Value Ref Range   Color, Urine YELLOW YELLOW   APPearance HAZY (A) CLEAR   Specific Gravity, Urine 1.021 1.005 - 1.030   pH 5.0 5.0 - 8.0   Glucose, UA 150 (A) NEGATIVE mg/dL   Hgb urine dipstick SMALL (A) NEGATIVE   Bilirubin Urine NEGATIVE NEGATIVE   Ketones, ur NEGATIVE NEGATIVE mg/dL   Protein, ur 30 (A) NEGATIVE mg/dL   Nitrite NEGATIVE NEGATIVE   Leukocytes,Ua SMALL (A) NEGATIVE   RBC / HPF 0-5 0 - 5 RBC/hpf   WBC, UA >50 (H) 0 - 5 WBC/hpf   Bacteria, UA MANY (A) NONE SEEN   Squamous Epithelial / LPF 0-5 0 - 5   WBC Clumps PRESENT    Mucus PRESENT    DG Chest Port 1 View  Result Date: 03/23/2020 CLINICAL DATA:  Cough, fever, body aches, chills EXAM: PORTABLE CHEST 1 VIEW COMPARISON:  03/02/2019 FINDINGS: Single frontal view of the  chest demonstrates postsurgical changes from bypass surgery and aortic valve replacement. Cardiac silhouette is unremarkable. No acute airspace disease, effusion, or pneumothorax. Minimal scarring within the left mid chest is stable. No acute bony abnormalities. IMPRESSION: 1. Stable postsurgical changes.  No acute intrathoracic process. Electronically Signed   By: Randa Ngo M.D.   On: 03/23/2020 15:08    MDM  Delay in UA collection.  Patient admitted to hospitalist service pending UA results.  Discussed concern for endocarditis versus possible UTI as well.  Dr. Jonelle Sidle accepting admission.       Mariyah Upshaw, [place] N, PA-C 03/23/20 2251    Truddie Hidden, MD 03/23/20 2312

## 2020-03-23 NOTE — Progress Notes (Addendum)
Addition of Cefepime  - Start Cefepime 2g IV q12h  - Monitor as below  Pharmacy Antibiotic Note  [name] is a 75 y.o. male admitted on 03/23/2020 with sepsis.  Pharmacy has been consulted for Vancomycin dosing. Also started on ceftriaxone. WBC elevated 21.3, febrile Tm 103.1. Scr elevated 1.3, baseline ~0.8, CrCl 56.   Plan: Vancomycin 1750 mg IV once, then 1500 mg IV q24 hrs (AUC 491) Monitor renal function, cultures/sensitivities, clinical progression Vancomycin levels when indicated     Temp (24hrs), Avg:101.6 F (38.7 C), Min:99.8 F (37.7 C), Max:103.1 F (39.5 C)  Recent Labs  Lab 03/23/20 1311  WBC 21.3*  CREATININE 1.30*    Estimated Creatinine Clearance: 56.4 mL/min (A) (by C-G formula based on SCr of 1.3 mg/dL (H)).    Allergies  Allergen Reactions  . Amiodarone Nausea And Vomiting  . Quinolones     Patient was warned about not using Cipro and similar antibiotics. Recent studies have raised concern that fluoroquinolone antibiotics could be associated with an increased risk of aortic aneurysm Fluoroquinolones have non-antimicrobial properties that might jeopardise the integrity of the extracellular matrix of the vascular wall In a  propensity score matched cohort study in [place], there was a 66% increased rate of aortic aneurysm or dissection associated with oral fluoroquinolone use, compared wit    Antimicrobials this admission: Vancomycin 2/8 >>  Ceftriaxone 2/8 >>   Dose adjustments this admission: N/A  Microbiology results: 2/8 BCx: pend 2/8 UCx: pend   Richardine Service, PharmD, BCPS PGY2 Cardiology Pharmacy Resident Phone: [phone] 03/23/2020  4:03 PM  Please check [email or web address] for unit-specific pharmacy phone numbers.

## 2020-03-23 NOTE — ED Triage Notes (Signed)
Pt arrives via [facility] from home with c/o cough, fever, body aches, chills and vomiting that began suddenly this morning. Reports he is fully vaccinated and boosted for covid. Pt received 1g tylenol pta for temp of 102.3. HR 98, O2 98%, RR 22.

## 2020-03-24 DIAGNOSIS — A419 Sepsis, unspecified organism: Secondary | ICD-10-CM | POA: Diagnosis present

## 2020-03-24 DIAGNOSIS — N390 Urinary tract infection, site not specified: Secondary | ICD-10-CM | POA: Diagnosis present

## 2020-03-24 LAB — COMPREHENSIVE METABOLIC PANEL
ALT: 27 U/L (ref 0–44)
AST: 28 U/L (ref 15–41)
Albumin: 3.1 g/dL — ABNORMAL LOW (ref 3.5–5.0)
Alkaline Phosphatase: 75 U/L (ref 38–126)
Anion gap: 11 (ref 5–15)
BUN: 14 mg/dL (ref 8–23)
CO2: 24 mmol/L (ref 22–32)
Calcium: 8.7 mg/dL — ABNORMAL LOW (ref 8.9–10.3)
Chloride: 100 mmol/L (ref 98–111)
Creatinine, Ser: 1.15 mg/dL (ref 0.61–1.24)
GFR, Estimated: >60 mL/min (ref >60)
Glucose, Bld: 236 mg/dL — ABNORMAL HIGH (ref 70–99)
Potassium: 3.9 mmol/L (ref 3.5–5.1)
Sodium: 135 mmol/L (ref 135–145)
Total Bilirubin: 0.7 mg/dL (ref 0.3–1.2)
Total Protein: 6.2 g/dL — ABNORMAL LOW (ref 6.5–8.1)

## 2020-03-24 LAB — CBC
HCT: 35.1 % — ABNORMAL LOW (ref 39.0–52.0)
HCT: 35.8 % — ABNORMAL LOW (ref 39.0–52.0)
Hemoglobin: 11.9 g/dL — ABNORMAL LOW (ref 13.0–17.0)
Hemoglobin: 11.5 g/dL — ABNORMAL LOW (ref 13.0–17.0)
MCH: 31 pg (ref 26.0–34.0)
MCH: 30 pg (ref 26.0–34.0)
MCHC: 33.9 g/dL (ref 30.0–36.0)
MCHC: 32.1 g/dL (ref 30.0–36.0)
MCV: 91.4 fL (ref 80.0–100.0)
MCV: 93.5 fL (ref 80.0–100.0)
Platelets: 180 10*3/uL (ref 150–400)
Platelets: 244 10*3/uL (ref 150–400)
RBC: 3.84 MIL/uL — ABNORMAL LOW (ref 4.22–5.81)
RBC: 3.83 MIL/uL — ABNORMAL LOW (ref 4.22–5.81)
RDW: 13.9 % (ref 11.5–15.5)
RDW: 13.7 % (ref 11.5–15.5)
WBC: 22 10*3/uL — ABNORMAL HIGH (ref 4.0–10.5)
WBC: 22.1 10*3/uL — ABNORMAL HIGH (ref 4.0–10.5)
nRBC: 0 % (ref 0.0–0.2)
nRBC: 0 % (ref 0.0–0.2)

## 2020-03-24 LAB — PROTIME-INR
INR: 1.4 — ABNORMAL HIGH (ref 0.8–1.2)
Prothrombin Time: 16.4 seconds — ABNORMAL HIGH (ref 11.4–15.2)

## 2020-03-24 LAB — HEMOGLOBIN A1C
Hgb A1c MFr Bld: 8.7 % — ABNORMAL HIGH (ref 4.8–5.6)
Mean Plasma Glucose: 202.99 mg/dL

## 2020-03-24 LAB — CORTISOL-AM, BLOOD: Cortisol - AM: 20.8 ug/dL (ref 6.7–22.6)

## 2020-03-24 LAB — CBG MONITORING, ED
Glucose-Capillary: 134 mg/dL — ABNORMAL HIGH (ref 70–99)
Glucose-Capillary: 158 mg/dL — ABNORMAL HIGH (ref 70–99)
Glucose-Capillary: 174 mg/dL — ABNORMAL HIGH (ref 70–99)
Glucose-Capillary: 181 mg/dL — ABNORMAL HIGH (ref 70–99)

## 2020-03-24 LAB — PROCALCITONIN: Procalcitonin: 2.01 ng/mL

## 2020-03-24 LAB — CREATININE, SERUM
Creatinine, Ser: 1.21 mg/dL (ref 0.61–1.24)
GFR, Estimated: >60 mL/min (ref >60)

## 2020-03-24 MED ORDER — INSULIN GLARGINE 100 UNIT/ML ~~LOC~~ SOLN
28.0000 [IU] | Freq: Every day | SUBCUTANEOUS | Status: DC
  Administered 2020-03-24 – 2020-03-25 (×2): 28 [IU] via SUBCUTANEOUS
  Filled 2020-03-24 (×3): qty 0.28

## 2020-03-24 MED ORDER — ASPIRIN EC 81 MG PO TBEC
81.0000 mg | DELAYED_RELEASE_TABLET | Freq: Every day | ORAL | Status: DC
  Administered 2020-03-24 – 2020-03-26 (×3): 81 mg via ORAL
  Filled 2020-03-24 (×3): qty 1

## 2020-03-24 NOTE — ED Notes (Signed)
Dinner Tray Ordered @ 1725. 

## 2020-03-24 NOTE — ED Notes (Signed)
Lunch Tray Ordered @ 1046. 

## 2020-03-24 NOTE — Progress Notes (Signed)
PROGRESS NOTE    [name]   GUY:[id number]  DOB: 1945/09/29  DOA: 03/23/2020 PCP: [name], NP   Brief Narrative:  [name] is a 75 y.o. male with medical history significant of aortic stenosis status  post bioprosthetic AVR in 2017 and ascending aorta repair in 2017, coronary artery disease with CABG also in 2017, diabetes recent COVID-19 infection, essential hypertension, hyperlipidemia who presents to the ER generalized body aches, chills and severe weakness. In the ED he is noted to have a temperature of 103.1.  Heart rate was 102 and respiratory rate was 24.  WBC count 21.3. UA suggestive of infection-when asked, he does admit to having some mild dysuria along with concentrated urine.  He has never had a urinary tract infection in the past.  Of note the patient also had a swelling of his left face and was started on antibiotics for a dental abscess.  He took these antibiotics for 4 days and stopped them about a week ago because he began to develop abdominal discomfort and diarrhea.  His tooth is feeling painful again and he states that part of his tooth has broken off.  Not seen a dentist.  He was last admitted on 1/2-1/10 with COVID-19 pneumonia and treated with remdesivir and dexamethasone.  Subjective: Patient has no complaints at this time    Assessment & Plan:   Principal Problem:   Sepsis ([place]) -Sounds like he recently had a dental abscess and was not able to finish the course of antibiotics due to GI upset-into the medication reconciliation, it appears that this medication was Augmentin -In addition he appears to have a urinary tract infection-he admits to dysuria over the past few days -Continue to follow blood cultures and continue antibiotics  Active Problems:   Type II diabetes mellitus with neurological manifestations, uncontrolled  -Continue insulin sliding scale and Lantus    Hyperlipidemia -Continue Crestor    Essential hypertension history of  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 -Continue metoprolol-hold HCTZ Continue to follow on telemetry    Hx of CABG (Sept 2017) -Continue aspirin and statin    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   Difficult to place patient No      Consultants:   None Procedures:   None Antimicrobials:  Anti-infectives (From admission, onward)   Start     Dose/Rate Route Frequency Ordered Stop   03/24/20 1600  vancomycin (VANCOREADY) IVPB 1500 mg/300 mL        1,500 mg 150 mL/hr over 120 Minutes Intravenous Every 24 hours 03/23/20 1558     03/23/20 2330  ceFEPIme (MAXIPIME) 2 g in sodium chloride 0.9 % 100 mL IVPB        2 g 200 mL/hr over 30 Minutes Intravenous  Once 03/23/20 2329 03/24/20  0304   03/23/20 2330  metroNIDAZOLE (FLAGYL) IVPB 500 mg        500 mg 100 mL/hr over 60 Minutes Intravenous Every 8 hours 03/23/20 2329     03/23/20 2330  vancomycin (VANCOREADY) IVPB 1000 mg/200 mL  Status:  Discontinued        1,000 mg 200 mL/hr over 60 Minutes Intravenous  Once 03/23/20 2329 03/23/20 2331   03/23/20 2330  ceFEPIme (MAXIPIME) 2 g in sodium chloride 0.9 % 100 mL IVPB        2 g 200 mL/hr over 30 Minutes Intravenous Every 12 hours 03/23/20 2211     03/23/20 2230  ceFEPIme (MAXIPIME) 2 g in sodium chloride 0.9 % 100 mL IVPB  Status:  Discontinued        2 g 200 mL/hr over 30 Minutes Intravenous Every 12 hours 03/23/20  2210 03/23/20 2211   03/23/20 2200  cefTRIAXone (ROCEPHIN) 2 g in sodium chloride 0.9 % 100 mL IVPB  Status:  Discontinued        2 g 200 mL/hr over 30 Minutes Intravenous Every 12 hours 03/23/20 1546 03/23/20 2210   03/23/20 1600  vancomycin (VANCOREADY) IVPB 1750 mg/350 mL        1,750 mg 175 mL/hr over 120 Minutes Intravenous  Once 03/23/20 1555 03/23/20 1942       Objective: Vitals:   03/24/20 1000 03/24/20 1030 03/24/20 1100 03/24/20 1130  BP:      Pulse: 65 63 61 (!) 58  Resp: (!) 23 (!) 24 15 15   Temp:      TempSrc:      SpO2: 99% 100% 100% 100%    Intake/Output Summary (Last 24 hours) at 03/24/2020 1135 Last data filed at 03/24/2020 4709 Gross per 24 hour  Intake 200 ml  Output -  Net 200 ml   There were no vitals filed for this visit.  Examination: General exam: Appears comfortable  HEENT: PERRLA, oral mucosa moist, no sclera icterus or thrush Respiratory system: Clear to auscultation. Respiratory effort normal. Cardiovascular system: S1 & S2 heard, RRR.   Gastrointestinal system: Abdomen soft, non-tender, nondistended. Normal bowel sounds. Central nervous system: Alert and oriented. No focal neurological deficits. Extremities: No cyanosis, clubbing or edema Skin: No rashes or ulcers Psychiatry:  Mood & affect appropriate.     Data Reviewed: I have personally reviewed following labs and imaging studies  CBC: Recent Labs  Lab 03/23/20 1311 03/23/20 2330 03/24/20 0407  WBC 21.3* 22.0* 22.1*  NEUTROABS 19.0*  --   --   HGB 12.3* 11.9* 11.5*  HCT 37.5* 35.1* 35.8*  MCV 91.7 91.4 93.5  PLT 261 180 628   Basic Metabolic Panel: Recent Labs  Lab 03/23/20 1311 03/23/20 2330 03/24/20 0407  NA 136  --  135  K 3.8  --  3.9  CL 99  --  100  CO2 24  --  24  GLUCOSE 204*  --  236*  BUN 14  --  14  CREATININE 1.30* 1.21 1.15  CALCIUM 9.3  --  8.7*   GFR: Estimated Creatinine Clearance: 63.7 mL/min (by C-G formula based on SCr of 1.15 mg/dL). Liver  Function Tests: Recent Labs  Lab 03/23/20 1311 03/24/20 0407  AST 50* 28  ALT 38 27  ALKPHOS 71 75  BILITOT 0.8 0.7  PROT 7.2 6.2*  ALBUMIN 3.7 3.1*   No results for input(s): LIPASE, AMYLASE in the last 168 hours. No results for input(s): AMMONIA in the last  168 hours. Coagulation Profile: Recent Labs  Lab 03/23/20 1522 03/24/20 0407  INR 1.2 1.4*   Cardiac Enzymes: No results for input(s): CKTOTAL, CKMB, CKMBINDEX, TROPONINI in the last 168 hours. BNP (last 3 results) No results for input(s): PROBNP in the last 8760 hours. HbA1C: Recent Labs    03/23/20 2330  HGBA1C 8.7*   CBG: Recent Labs  Lab 03/24/20 0043 03/24/20 0754  GLUCAP 181* 134*   Lipid Profile: No results for input(s): CHOL, HDL, LDLCALC, TRIG, CHOLHDL, LDLDIRECT in the last 72 hours. Thyroid Function Tests: No results for input(s): TSH, T4TOTAL, FREET4, T3FREE, THYROIDAB in the last 72 hours. Anemia Panel: No results for input(s): VITAMINB12, FOLATE, FERRITIN, TIBC, IRON, RETICCTPCT in the last 72 hours. Urine analysis:    Component Value Date/Time   COLORURINE YELLOW 03/23/2020 1435   APPEARANCEUR HAZY (A) 03/23/2020 1435   LABSPEC 1.021 03/23/2020 1435   PHURINE 5.0 03/23/2020 1435   GLUCOSEU 150 (A) 03/23/2020 1435   GLUCOSEU NEGATIVE 04/06/2010 1554   HGBUR SMALL (A) 03/23/2020 1435   BILIRUBINUR NEGATIVE 03/23/2020 1435   BILIRUBINUR N 03/24/2016 0905   KETONESUR NEGATIVE 03/23/2020 1435   PROTEINUR 30 (A) 03/23/2020 1435   UROBILINOGEN 0.2 03/24/2016 0905   UROBILINOGEN 1.0 04/03/2014 0058   NITRITE NEGATIVE 03/23/2020 1435   LEUKOCYTESUR SMALL (A) 03/23/2020 1435   Sepsis Labs: @LABRCNTIP (procalcitonin:4,lacticidven:4) ) Recent Results (from the past 240 hour(s))  SARS CORONAVIRUS 2 (TAT 6-24 HRS) Nasopharyngeal Nasopharyngeal Swab     Status: None   Collection Time: 03/23/20  1:46 PM   Specimen: Nasopharyngeal Swab  Result Value Ref Range Status   SARS Coronavirus 2  NEGATIVE NEGATIVE Final    Comment: (NOTE) SARS-CoV-2 target nucleic acids are NOT DETECTED.  The SARS-CoV-2 RNA is generally detectable in upper and lower respiratory specimens during the acute phase of infection. Negative results do not preclude SARS-CoV-2 infection, do not rule out co-infections with other pathogens, and should not be used as the sole basis for treatment or other patient management decisions. Negative results must be combined with clinical observations, patient history, and epidemiological information. The expected result is Negative.  Fact Sheet for Patients: SugarRoll.be  Fact Sheet for Healthcare Providers: https://www.woods-mathews.com/  This test is not yet approved or cleared by the [place] FDA and  has been authorized for detection and/or diagnosis of SARS-CoV-2 by FDA under an Emergency Use Authorization (EUA). This EUA will remain  in effect (meaning this test can be used) for the duration of the COVID-19 declaration under Se ction 564(b)(1) of the Act, 21 U.S.C. section 360bbb-3(b)(1), unless the authorization is terminated or revoked sooner.  Performed at [facility], [place] [address], [place], [place] [address]   Blood culture (routine single)     Status: None (Preliminary result)   Collection Time: 03/23/20  2:35 PM   Specimen: BLOOD  Result Value Ref Range Status   Specimen Description BLOOD LEFT ANTECUBITAL  Final   Special Requests   Final    BOTTLES DRAWN AEROBIC AND ANAEROBIC Blood Culture adequate volume   Culture   Final    NO GROWTH < 24 HOURS Performed at [facility], [place] [address], [place], [place] [address]    Report Status PENDING  Incomplete  Culture, blood (single)     Status: None (Preliminary result)   Collection Time: 03/23/20  2:38 PM   Specimen: BLOOD RIGHT ARM  Result Value Ref Range Status   Specimen Description BLOOD RIGHT ARM  Final   Special Requests    Final    BOTTLES DRAWN AEROBIC AND ANAEROBIC Blood Culture adequate volume   Culture   Final    NO GROWTH < 24 HOURS Performed at [facility], [address] [place], [place] [address]    Report Status PENDING  Incomplete         Radiology Studies: DG Chest Port 1 View  Result Date: 03/23/2020 CLINICAL DATA:  Cough, fever, body aches, chills EXAM: PORTABLE CHEST 1 VIEW COMPARISON:  03/02/2019 FINDINGS: Single frontal view of the chest demonstrates postsurgical changes from bypass surgery and aortic valve replacement. Cardiac silhouette is unremarkable. No acute airspace disease, effusion, or pneumothorax. Minimal scarring within the left mid chest is stable. No acute bony abnormalities. IMPRESSION: 1. Stable postsurgical changes.  No acute intrathoracic process. Electronically Signed   By: Randa Ngo M.D.   On: 03/23/2020 15:08      Scheduled Meds: . aspirin EC  81 mg Oral Daily  . enoxaparin (LOVENOX) injection  40 mg Subcutaneous QHS  . insulin aspart  0-15 Units Subcutaneous TID WC  . insulin aspart  0-5 Units Subcutaneous QHS  . insulin glargine  28 Units Subcutaneous QHS  . metoprolol tartrate  25 mg Oral BID  . naproxen  500 mg Oral BID WC  . rosuvastatin  40 mg Oral QHS   Continuous Infusions: . ceFEPime (MAXIPIME) IV    . lactated ringers 100 mL/hr at 03/24/20 0203  . metronidazole Stopped (03/24/20 0742)  . vancomycin       LOS: 1 day      Debbe Odea, MD [facility] Pager: [email or web address] 03/24/2020, 11:35 AM

## 2020-03-24 NOTE — ED Notes (Addendum)
RN informed of pt elevated blood glucose levels

## 2020-03-24 NOTE — ED Notes (Signed)
Need urine Breakfast order placed

## 2020-03-25 ENCOUNTER — Other Ambulatory Visit: Payer: Self-pay

## 2020-03-25 ENCOUNTER — Encounter (HOSPITAL_COMMUNITY): Payer: Self-pay | Admitting: Internal Medicine

## 2020-03-25 LAB — CBG MONITORING, ED
Glucose-Capillary: 164 mg/dL — ABNORMAL HIGH (ref 70–99)
Glucose-Capillary: 93 mg/dL (ref 70–99)
Glucose-Capillary: 109 mg/dL — ABNORMAL HIGH (ref 70–99)

## 2020-03-25 LAB — CBC
HCT: 35 % — ABNORMAL LOW (ref 39.0–52.0)
Hemoglobin: 11.9 g/dL — ABNORMAL LOW (ref 13.0–17.0)
MCH: 31.3 pg (ref 26.0–34.0)
MCHC: 34 g/dL (ref 30.0–36.0)
MCV: 92.1 fL (ref 80.0–100.0)
Platelets: 250 10*3/uL (ref 150–400)
RBC: 3.8 MIL/uL — ABNORMAL LOW (ref 4.22–5.81)
RDW: 14.8 % (ref 11.5–15.5)
WBC: 13.1 10*3/uL — ABNORMAL HIGH (ref 4.0–10.5)
nRBC: 0 % (ref 0.0–0.2)

## 2020-03-25 LAB — BASIC METABOLIC PANEL
Anion gap: 10 (ref 5–15)
BUN: 14 mg/dL (ref 8–23)
CO2: 24 mmol/L (ref 22–32)
Calcium: 8.9 mg/dL (ref 8.9–10.3)
Chloride: 103 mmol/L (ref 98–111)
Creatinine, Ser: 1.09 mg/dL (ref 0.61–1.24)
GFR, Estimated: >60 mL/min (ref >60)
Glucose, Bld: 117 mg/dL — ABNORMAL HIGH (ref 70–99)
Potassium: 4.1 mmol/L (ref 3.5–5.1)
Sodium: 137 mmol/L (ref 135–145)

## 2020-03-25 LAB — GLUCOSE, CAPILLARY: Glucose-Capillary: 142 mg/dL — ABNORMAL HIGH (ref 70–99)

## 2020-03-25 MED ORDER — VANCOMYCIN HCL 1000 MG/200ML IV SOLN
1000.0000 mg | Freq: Two times a day (BID) | INTRAVENOUS | Status: DC
  Administered 2020-03-25: 1000 mg via INTRAVENOUS
  Filled 2020-03-25 (×2): qty 200

## 2020-03-25 MED ORDER — SODIUM CHLORIDE 0.9 % IV SOLN
2.0000 g | Freq: Three times a day (TID) | INTRAVENOUS | Status: DC
  Administered 2020-03-25 – 2020-03-26 (×5): 2 g via INTRAVENOUS
  Filled 2020-03-25 (×5): qty 2

## 2020-03-25 NOTE — ED Notes (Signed)
Breakfast Ordered 

## 2020-03-25 NOTE — Progress Notes (Addendum)
PROGRESS NOTE    [name]   ESP:[id number]  DOB: [birth year]  DOA: 03/23/2020 PCP: [name], NP   Brief Narrative:  [name] is a 75 y.o. male with medical history significant of aortic stenosis status  post bioprosthetic AVR in 2017 and ascending aorta repair in 2017, coronary artery disease with CABG also in 2017, diabetes recent COVID-19 infection, essential hypertension, hyperlipidemia who presents to the ER generalized body aches, chills and severe weakness. In the ED he is noted to have a temperature of 103.1.  Heart rate was 102 and respiratory rate was 24.  WBC count 21.3. UA suggestive of infection-when asked, he does admit to having some mild dysuria along with concentrated urine.  He has never had a urinary tract infection in the past.  Of note the patient also had a swelling of his left face and was started on antibiotics for a dental abscess.  He took these antibiotics for 4 days and stopped them about a week ago because he began to develop abdominal discomfort and diarrhea.  His tooth is feeling painful again and he states that part of his tooth has broken off.  Not seen a dentist.  He was last admitted on 1/2-1/10 with COVID-19 pneumonia and treated with remdesivir and dexamethasone.  Subjective:   Pain in right jaw is improving. Dysuria is better but not yet resolved. No further rigors today- he did have some yesterday.     Assessment & Plan:   Principal Problem:   Severe sepsis  - lactic acid 2.2 - it seems like he recently had a dental abscess and was not able to finish the course of antibiotics due to GI upset- looking in to the medication reconciliation, it appears that this medication was Augmentin -In addition he appears to have a urinary tract infection-he admits to dysuria over the past few days - WBC count is improving - blood cultures negative - urine culture growing K pneumoniae -  DC Vanc as no MRSA on cultures- continue Cefepime and Flagyl  - try to avoid Augmentin on dc as it causes GI upset   Active Problems: Probable dental abscess - he has a broken tooth in his right lower jaw- I have advised him to see a dentist to get the rest of it removed - pain in jaw is improving with the antibiotics    Type II diabetes mellitus with neurological manifestations, uncontrolled  -Continue insulin sliding scale and Lantus    Hyperlipidemia -Continue Crestor    Essential hypertension history of 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 -Continue metoprolol-hold HCTZ Continue to follow on telemetry    Hx of CABG (Sept 2017) -Continue aspirin and statin    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   Difficult to place patient No  Consultants:   None Procedures:   None Antimicrobials:  Anti-infectives (From admission, onward)   Start     Dose/Rate Route Frequency Ordered Stop   03/25/20 1000  vancomycin (VANCOREADY) IVPB 1000 mg/200 mL        1,000 mg 200 mL/hr over 60 Minutes Intravenous Every 12 hours 03/25/20 0840     03/25/20 0900  ceFEPIme (MAXIPIME) 2 g in sodium chloride 0.9 % 100 mL IVPB        2 g 200 mL/hr over 30 Minutes Intravenous Every 8 hours 03/25/20 0837     03/24/20 1600  vancomycin (VANCOREADY) IVPB 1500 mg/300 mL  Status:  Discontinued        1,500 mg 150 mL/hr over 120 Minutes Intravenous Every 24 hours 03/23/20 1558 03/25/20 0840    03/23/20 2330  ceFEPIme (MAXIPIME) 2 g in sodium chloride 0.9 % 100 mL IVPB        2 g 200 mL/hr over 30 Minutes Intravenous  Once 03/23/20 2329 03/24/20 0304   03/23/20 2330  metroNIDAZOLE (FLAGYL) IVPB 500 mg        500 mg 100 mL/hr over 60 Minutes Intravenous Every 8 hours 03/23/20 2329     03/23/20 2330  vancomycin (VANCOREADY) IVPB 1000 mg/200 mL  Status:  Discontinued        1,000 mg 200 mL/hr over 60 Minutes Intravenous  Once 03/23/20 2329 03/23/20 2331   03/23/20 2330  ceFEPIme (MAXIPIME) 2 g in sodium chloride 0.9 % 100 mL IVPB  Status:  Discontinued        2 g 200 mL/hr over 30 Minutes Intravenous Every 12 hours 03/23/20 2211 03/25/20 0837   03/23/20 2230  ceFEPIme (MAXIPIME) 2 g in sodium chloride 0.9 % 100 mL IVPB  Status:  Discontinued        2 g 200 mL/hr over 30 Minutes Intravenous Every 12 hours 03/23/20 2210 03/23/20 2211   03/23/20 2200  cefTRIAXone (ROCEPHIN) 2 g in sodium chloride 0.9 % 100 mL IVPB  Status:  Discontinued        2 g 200 mL/hr over 30 Minutes Intravenous Every 12 hours 03/23/20 1546 03/23/20 2210   03/23/20 1600  vancomycin (VANCOREADY) IVPB 1750 mg/350 mL        1,750 mg 175 mL/hr over 120 Minutes Intravenous  Once 03/23/20 1555 03/23/20 1942       Objective: Vitals:   03/25/20 0830 03/25/20 1100 03/25/20 1244 03/25/20 1245  BP: 130/65 (!) 157/65  135/74  Pulse: 62 (!) 59 62 62  Resp: 19 19 15 16   Temp:   98.4 F (36.9 C) 98.4 F (36.9 C)  TempSrc:   Oral   SpO2: 99% 100% 98% 99%  Weight:      Height:       No intake or output data in the 24 hours ending 03/25/20 1324 Filed Weights   03/24/20 1738  Weight: 83 kg    Examination: General exam: Appears comfortable  HEENT: PERRLA, oral mucosa moist, no sclera icterus or thrush Respiratory system: Clear to auscultation. Respiratory effort normal. Cardiovascular system: S1 & S2 heard, RRR.   Gastrointestinal system: Abdomen soft, non-tender, nondistended. Normal bowel sounds. Central  nervous system: Alert and oriented. No focal neurological deficits. Extremities: No cyanosis, clubbing or edema Skin: No rashes or ulcers Psychiatry:  Mood & affect appropriate.   Data Reviewed: I have personally reviewed following labs and imaging studies  CBC: Recent Labs  Lab 03/23/20 1311 03/23/20 2330 03/24/20 0407 03/25/20  0500  WBC 21.3* 22.0* 22.1* 13.1*  NEUTROABS 19.0*  --   --   --   HGB 12.3* 11.9* 11.5* 11.9*  HCT 37.5* 35.1* 35.8* 35.0*  MCV 91.7 91.4 93.5 92.1  PLT 261 180 244 546   Basic Metabolic Panel: Recent Labs  Lab 03/23/20 1311 03/23/20 2330 03/24/20 0407 03/25/20 0558  NA 136  --  135 137  K 3.8  --  3.9 4.1  CL 99  --  100 103  CO2 24  --  24 24  GLUCOSE 204*  --  236* 117*  BUN 14  --  14 14  CREATININE 1.30* 1.21 1.15 1.09  CALCIUM 9.3  --  8.7* 8.9   GFR: Estimated Creatinine Clearance: 67.2 mL/min (by C-G formula based on SCr of 1.09 mg/dL). Liver Function Tests: Recent Labs  Lab 03/23/20 1311 03/24/20 0407  AST 50* 28  ALT 38 27  ALKPHOS 71 75  BILITOT 0.8 0.7  PROT 7.2 6.2*  ALBUMIN 3.7 3.1*   No results for input(s): LIPASE, AMYLASE in the last 168 hours. No results for input(s): AMMONIA in the last 168 hours. Coagulation Profile: Recent Labs  Lab 03/23/20 1522 03/24/20 0407  INR 1.2 1.4*   Cardiac Enzymes: No results for input(s): CKTOTAL, CKMB, CKMBINDEX, TROPONINI in the last 168 hours. BNP (last 3 results) No results for input(s): PROBNP in the last 8760 hours. HbA1C: Recent Labs    03/23/20 2330  HGBA1C 8.7*   CBG: Recent Labs  Lab 03/24/20 1140 03/24/20 1645 03/24/20 2145 03/25/20 0759 03/25/20 1218  GLUCAP 158* 164* 174* 93 109*   Lipid Profile: No results for input(s): CHOL, HDL, LDLCALC, TRIG, CHOLHDL, LDLDIRECT in the last 72 hours. Thyroid Function Tests: No results for input(s): TSH, T4TOTAL, FREET4, T3FREE, THYROIDAB in the last 72 hours. Anemia Panel: No results for input(s): VITAMINB12,  FOLATE, FERRITIN, TIBC, IRON, RETICCTPCT in the last 72 hours. Urine analysis:    Component Value Date/Time   COLORURINE YELLOW 03/23/2020 1435   APPEARANCEUR HAZY (A) 03/23/2020 1435   LABSPEC 1.021 03/23/2020 1435   PHURINE 5.0 03/23/2020 1435   GLUCOSEU 150 (A) 03/23/2020 1435   GLUCOSEU NEGATIVE 04/06/2010 1554   HGBUR SMALL (A) 03/23/2020 1435   BILIRUBINUR NEGATIVE 03/23/2020 1435   BILIRUBINUR N 03/24/2016 0905   KETONESUR NEGATIVE 03/23/2020 1435   PROTEINUR 30 (A) 03/23/2020 1435   UROBILINOGEN 0.2 03/24/2016 0905   UROBILINOGEN 1.0 04/03/2014 0058   NITRITE NEGATIVE 03/23/2020 1435   LEUKOCYTESUR SMALL (A) 03/23/2020 1435   Sepsis Labs: @LABRCNTIP (procalcitonin:4,lacticidven:4) ) Recent Results (from the past 240 hour(s))  SARS CORONAVIRUS 2 (TAT 6-24 HRS) Nasopharyngeal Nasopharyngeal Swab     Status: None   Collection Time: 03/23/20  1:46 PM   Specimen: Nasopharyngeal Swab  Result Value Ref Range Status   SARS Coronavirus 2 NEGATIVE NEGATIVE Final    Comment: (NOTE) SARS-CoV-2 target nucleic acids are NOT DETECTED.  The SARS-CoV-2 RNA is generally detectable in upper and lower respiratory specimens during the acute phase of infection. Negative results do not preclude SARS-CoV-2 infection, do not rule out co-infections with other pathogens, and should not be used as the sole basis for treatment or other patient management decisions. Negative results must be combined with clinical observations, patient history, and epidemiological information. The expected result is Negative.  Fact Sheet for Patients: SugarRoll.be  Fact Sheet for Healthcare Providers: https://www.woods-mathews.com/  This test is not yet approved or cleared by the [place] FDA and  has  been authorized for detection and/or diagnosis of SARS-CoV-2 by FDA under an Emergency Use Authorization (EUA). This EUA will remain  in effect (meaning this test  can be used) for the duration of the COVID-19 declaration under Se ction 564(b)(1) of the Act, 21 U.S.C. section 360bbb-3(b)(1), unless the authorization is terminated or revoked sooner.  Performed at [facility], [place] [address], [place], [place] [address]   Blood culture (routine single)     Status: None (Preliminary result)   Collection Time: 03/23/20  2:35 PM   Specimen: BLOOD  Result Value Ref Range Status   Specimen Description BLOOD LEFT ANTECUBITAL  Final   Special Requests   Final    BOTTLES DRAWN AEROBIC AND ANAEROBIC Blood Culture adequate volume   Culture   Final    NO GROWTH 2 DAYS Performed at [facility], [place] [address] [place], [place] [address]    Report Status PENDING  Incomplete  Urine culture     Status: Abnormal (Preliminary result)   Collection Time: 03/23/20  2:35 PM   Specimen: In/Out Cath Urine  Result Value Ref Range Status   Specimen Description IN/OUT CATH URINE  Final   Special Requests NONE  Final   Culture (A)  Final    >=100,000 COLONIES/mL KLEBSIELLA PNEUMONIAE SUSCEPTIBILITIES TO FOLLOW Performed at [facility], [place] [address], [place], [place] [address]    Report Status PENDING  Incomplete  Culture, blood (single)     Status: None (Preliminary result)   Collection Time: 03/23/20  2:38 PM   Specimen: BLOOD RIGHT ARM  Result Value Ref Range Status   Specimen Description BLOOD RIGHT ARM  Final   Special Requests   Final    BOTTLES DRAWN AEROBIC AND ANAEROBIC Blood Culture adequate volume   Culture   Final    NO GROWTH 2 DAYS Performed at [facility], [place] [address], [place], [place] [address]    Report Status PENDING  Incomplete         Radiology Studies: DG Chest Port 1 View  Result Date: 03/23/2020 CLINICAL DATA:  Cough, fever, body aches, chills EXAM: PORTABLE CHEST 1 VIEW COMPARISON:  03/02/2019 FINDINGS: Single frontal view of the chest demonstrates postsurgical changes from bypass surgery and aortic  valve replacement. Cardiac silhouette is unremarkable. No acute airspace disease, effusion, or pneumothorax. Minimal scarring within the left mid chest is stable. No acute bony abnormalities. IMPRESSION: 1. Stable postsurgical changes.  No acute intrathoracic process. Electronically Signed   By: Randa Ngo M.D.   On: 03/23/2020 15:08      Scheduled Meds: . aspirin EC  81 mg Oral Daily  . enoxaparin (LOVENOX) injection  40 mg Subcutaneous QHS  . insulin aspart  0-15 Units Subcutaneous TID WC  . insulin aspart  0-5 Units Subcutaneous QHS  . insulin glargine  28 Units Subcutaneous QHS  . metoprolol tartrate  25 mg Oral BID  . naproxen  500 mg Oral BID WC  . rosuvastatin  40 mg Oral QHS   Continuous Infusions: . ceFEPime (MAXIPIME) IV Stopped (03/25/20 1034)  . lactated ringers 100 mL/hr at 03/24/20 2356  . metronidazole Stopped (03/25/20 0857)  . vancomycin Stopped (03/25/20 1200)     LOS: 2 days      Debbe Odea, MD [facility] Pager: [email or web address] 03/25/2020, 1:24 PM

## 2020-03-25 NOTE — Progress Notes (Signed)
Pharmacy Antibiotic Note  [name] is a 75 y.o. male admitted on 03/23/2020 with sepsis. Pharmacy has been consulted for Vancomycin and cefepime dosing. Also started on metronidazole. Pt is now afebrile and WBC is trending down. Scr is also down.    Plan: Change cefepime to 2gm IV Q8H Change vancomycin to 1gm IV Q12H  F/u renal fxn, C&S, clinical status and peak/trough at SS  Height: 6' (182.9 cm) Weight: 83 kg (182 lb 15.7 oz) IBW/kg (Calculated) : 77.6  Temp (24hrs), Avg:98.6 F (37 C), Min:98.3 F (36.8 C), Max:99 F (37.2 C)  Recent Labs  Lab 03/23/20 1311 03/23/20 1522 03/23/20 1635 03/23/20 2330 03/24/20 0407 03/25/20 0500 03/25/20 0558  WBC 21.3*  --   --  22.0* 22.1* 13.1*  --   CREATININE 1.30*  --   --  1.21 1.15  --  1.09  LATICACIDVEN  --  2.2* 1.2  --   --   --   --     Estimated Creatinine Clearance: 67.2 mL/min (by C-G formula based on SCr of 1.09 mg/dL).    Allergies  Allergen Reactions  . Amiodarone Nausea And Vomiting  . Quinolones     Patient was warned about not using Cipro and similar antibiotics. Recent studies have raised concern that fluoroquinolone antibiotics could be associated with an increased risk of aortic aneurysm Fluoroquinolones have non-antimicrobial properties that might jeopardise the integrity of the extracellular matrix of the vascular wall In a  propensity score matched cohort study in [place], there was a 66% increased rate of aortic aneurysm or dissection associated with oral fluoroquinolone use, compared wit    Antimicrobials this admission: Vancomycin 2/8>> Cefepime 2/8>> Flagyl 2/8>> Ceftriaxone x 1 2/8  Microbiology results: 2/8 BCx: NGTD 2/8 UCx: pend   Salome Arnt, PharmD, BCPS Clinical Pharmacist Please see AMION for all pharmacy numbers 03/25/2020 8:42 AM

## 2020-03-26 DIAGNOSIS — N390 Urinary tract infection, site not specified: Secondary | ICD-10-CM

## 2020-03-26 LAB — CBC
HCT: 34.4 % — ABNORMAL LOW (ref 39.0–52.0)
Hemoglobin: 11.8 g/dL — ABNORMAL LOW (ref 13.0–17.0)
MCH: 30.8 pg (ref 26.0–34.0)
MCHC: 34.3 g/dL (ref 30.0–36.0)
MCV: 89.8 fL (ref 80.0–100.0)
Platelets: 237 10*3/uL (ref 150–400)
RBC: 3.83 MIL/uL — ABNORMAL LOW (ref 4.22–5.81)
RDW: 13.7 % (ref 11.5–15.5)
WBC: 8.1 10*3/uL (ref 4.0–10.5)
nRBC: 0 % (ref 0.0–0.2)

## 2020-03-26 LAB — GLUCOSE, CAPILLARY
Glucose-Capillary: 138 mg/dL — ABNORMAL HIGH (ref 70–99)
Glucose-Capillary: 93 mg/dL (ref 70–99)
Glucose-Capillary: 149 mg/dL — ABNORMAL HIGH (ref 70–99)
Glucose-Capillary: 119 mg/dL — ABNORMAL HIGH (ref 70–99)

## 2020-03-26 LAB — URINE CULTURE: Culture: >=100000 — AB

## 2020-03-26 MED ORDER — CIPROFLOXACIN HCL 500 MG PO TABS: 500.0000 mg | ORAL_TABLET | Freq: Two times a day (BID) | ORAL | 0 refills | Status: AC

## 2020-03-26 NOTE — Plan of Care (Signed)
  Problem: Health Behavior/Discharge Planning: Goal: Ability to manage health-related needs will improve Outcome: Progressing   Problem: Clinical Measurements: Goal: Will remain free from infection Outcome: Progressing   Problem: Coping: Goal: Level of anxiety will decrease Outcome: Progressing

## 2020-03-26 NOTE — Discharge Summary (Signed)
Physician Discharge Summary [facility]    Patient: [name]                   Admit date: 03/23/2020   DOB: [birth year]             Discharge date:03/26/2020/12:32 PM IRJ:[id number]                          PCP: [name], NP  Disposition: HOME  Recommendations for Outpatient Follow-up:   . 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)   Atrial fibrillation (post op after CABG Sept 2017)   PAF (paroxysmal atrial fibrillation) (HCC)   S/P AVR (aortic valve replacement)   Sepsis secondary to UTI [facility])   History of Present Illness/ Hospital Course Kathleen Argue Summary:     [name] is a 75 y.o.malewith medical history significant ofaortic stenosis status  post bioprosthetic AVR in 2017 and ascending aorta repair in 2017, coronary artery disease with CABG also in 2017, diabetes recent COVID-19 infection, essential hypertension, hyperlipidemia who presents to the ER generalized body aches, chills and severe weakness. In the ED he is noted to have a temperature of 103.1.  Heart rate was 102 and respiratory rate was 24.  WBC count 21.3. UA suggestive of infection-when asked, he does admit to having some mild dysuria along with concentrated urine.  He has never had a urinary tract infection in the past.  Of note the patient also had a swelling of his left face and was started on antibiotics for a dental abscess.  He took these antibiotics for 4 days and stopped them about a week ago because he began to develop abdominal discomfort and diarrhea.  His tooth is feeling painful again and he states that part of his tooth has broken off.  Not seen a  dentist.  He was last admitted on 1/2-1/10 with COVID-19 pneumonia and treated with remdesivir and dexamethasone.  Subjective:   Pain in right jaw is improving. Dysuria is better but not yet resolved. No further rigors today- he did have some yesterday.     Assessment & Plan:   Principal Problem:   Severe sepsis  - lactic acid 2.2 - it seems like he recently had a dental abscess and was not able to finish the course of antibiotics due to GI upset- looking in to the medication reconciliation, it appears that this medication was Augmentin -In addition he appears to have a urinary tract infection-he admits to dysuria over the past few days -Improved leukocytosis, hemodynamically stable - blood cultures negative to date - urine culture growing K pneumoniae -multidrug-resistant with exception of ciprofloxacin, gentamicin, imipenem Recommending ciprofloxacin 500 mg p.o. twice daily-pros and cons of all p.o. antibiotics from 4/10 discussed with patient in detail -this is still allergic due to his history that this may increase his chance aortic dissection -but given his infection, multidrug-resistant Klebsiella, his options are limited. We believe he will benefit for 5 days with coverage of quinolone with mild-moderate risk (The above was discussed with the patient and ID pharmacy in detail)  -  [place] as  no MRSA on cultures- was on  Cefepime and Flagyl      Active Problems: Probable dental abscess - he has a broken tooth in his right lower jaw -He has been advised to see a  dentist to get the rest of it removed - pain in jaw is improving with the antibiotics -Recommended to see his dentist as soon as possible    Type II diabetes mellitus with neurological manifestations, uncontrolled  -Continue insulin sliding scale and Lantus    Hyperlipidemia -Continue Crestor    Essential hypertension history of 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 -Continue metoprolol-hold  HCTZ Continue to follow on telemetry    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 I agree with the assessment and plan as outlined below:   Discharge Instructions:   Discharge Instructions    Activity as tolerated - No restrictions   Complete by: As directed    Activity as tolerated - No restrictions   Complete by: As directed    Call MD for:  persistant dizziness or light-headedness   Complete by: As directed    Call MD for:  redness, tenderness, or signs of infection (pain, swelling, redness, odor or green/yellow discharge around incision site)   Complete by: As directed    Call MD for:  temperature >100.4   Complete by: As directed    Diet - low sodium heart healthy   Complete by: As directed    Increase activity slowly   Complete by: As directed    Increase activity slowly   Complete by: As directed        Medication List    STOP taking these medications   amoxicillin-clavulanate 875-125 MG tablet Commonly known as: AUGMENTIN   hydrochlorothiazide 25 MG tablet Commonly known as: HYDRODIURIL     TAKE these medications   Accu-Chek Guide Me w/Device Kit 1 Stick by Does not apply route 3 (three) times daily. Used to check blood sugar 3 times daily What changed:   when to take this  additional instructions   Accu-Chek Guide test strip Generic drug: glucose blood Use as instructed   albuterol 108 (90 Base) MCG/ACT inhaler Commonly known as: VENTOLIN HFA Inhale 1-2 puffs into the lungs 2 (two) times daily as needed for wheezing or shortness of breath.   aspirin EC 81 MG tablet Take 81 mg by mouth  daily.   B-D ULTRAFINE III SHORT PEN 31G X 8 MM Misc Generic drug: Insulin Pen Needle Use QHS   ciprofloxacin 500 MG tablet Commonly known as: Cipro Take 1 tablet (500 mg total) by mouth 2 (two) times daily for 5 days.   glipiZIDE 10 MG 24 hr tablet Commonly known as: GLUCOTROL XL Take 1 tablet (10 mg total) by mouth daily with breakfast.   insulin glargine 100 UNIT/ML Solostar Pen Commonly known as: LANTUS Inject 32 Units into the skin at bedtime.   metFORMIN 1000 MG tablet Commonly known as: GLUCOPHAGE Take 1 tablet (1,000 mg total) by mouth 2 (two) times daily with a meal.   metoprolol tartrate 25 MG tablet Commonly known as: LOPRESSOR TAKE  1 TABLET BY MOUTH TWICE A DAY   rosuvastatin 20 MG tablet Commonly known as: CRESTOR Take 40 mg by mouth at bedtime.       Allergies  Allergen Reactions  . Amiodarone Nausea And Vomiting  . Quinolones     Patient was warned about not using Cipro and similar antibiotics. Recent studies have raised concern that fluoroquinolone antibiotics could be associated with an increased risk of aortic aneurysm Fluoroquinolones have non-antimicrobial properties that might jeopardise the integrity of the extracellular matrix of the vascular wall In a  propensity score matched cohort study in [place], there was a 66% increased rate of aortic aneurysm or dissection associated with oral fluoroquinolone use, compared wit     Procedures /Studies:   DG Chest Port 1 View  Result Date: 03/23/2020 CLINICAL DATA:  Cough, fever, body aches, chills EXAM: PORTABLE CHEST 1 VIEW COMPARISON:  03/02/2019 FINDINGS: Single frontal view of the chest demonstrates postsurgical changes from bypass surgery and aortic valve replacement. Cardiac silhouette is unremarkable. No acute airspace disease, effusion, or pneumothorax. Minimal scarring within the left mid chest is stable. No acute bony abnormalities. IMPRESSION: 1. Stable postsurgical changes.  No acute  intrathoracic process. Electronically Signed   By: Randa Ngo M.D.   On: 03/23/2020 15:08     Subjective:   Patient was seen and examined 03/26/2020, 12:32 PM Patient stable today. No acute distress.  No issues overnight Stable for discharge.  Discharge Exam:    Vitals:   03/25/20 1700 03/25/20 2051 03/26/20 0421 03/26/20 0851  BP: (!) 159/61 (!) 150/65 129/64 139/65  Pulse: 61 62 (!) 58 (!) 55  Resp: _0 Temp: 98 F (36.7 C) 99.2 F (37.3 C) 98.4 F (36.9 C) 98.7 F (37.1 C)  TempSrc:  Oral  Oral  SpO2: 100% 99% 96% 99%  Weight:  87.5 kg    Height:        General: Pt lying comfortably in bed & appears in no obvious distress. Cardiovascular: S1 & S2 heard, RRR, S1/S2 +. No murmurs, rubs, gallops or clicks. No JVD or pedal edema. Respiratory: Clear to auscultation without wheezing, rhonchi or crackles. No increased work of breathing. Abdominal:  Non-distended, non-tender & soft. No organomegaly or masses appreciated. Normal bowel sounds heard. CNS: Alert and oriented. No focal deficits. Extremities: no edema, no cyanosis      The results of significant diagnostics from this hospitalization (including imaging, microbiology, ancillary and laboratory) are listed below for reference.      Microbiology:   Recent Results (from the past 240 hour(s))  SARS CORONAVIRUS 2 (TAT 6-24 HRS) Nasopharyngeal Nasopharyngeal Swab     Status: None   Collection Time: 03/23/20  1:46 PM   Specimen: Nasopharyngeal Swab  Result Value Ref Range Status   SARS Coronavirus 2 NEGATIVE NEGATIVE Final    Comment: (NOTE) SARS-CoV-2 target nucleic acids are NOT DETECTED.  The SARS-CoV-2 RNA is generally detectable in upper and lower respiratory specimens during the acute phase of infection. Negative results do not preclude SARS-CoV-2 infection, do not rule out co-infections with other pathogens, and should not be used as the sole basis for treatment or other patient management  decisions. Negative results must be combined with clinical observations, patient history, and epidemiological information. The expected result is Negative.  Fact Sheet for Patients: SugarRoll.be  Fact Sheet for Healthcare Providers: https://www.woods-mathews.com/  This test is not yet approved or cleared by the [place] FDA and  has been authorized for  detection and/or diagnosis of SARS-CoV-2 by FDA under an Emergency Use Authorization (EUA). This EUA will remain  in effect (meaning this test can be used) for the duration of the COVID-19 declaration under Se ction 564(b)(1) of the Act, 21 U.S.C. section 360bbb-3(b)(1), unless the authorization is terminated or revoked sooner.  Performed at [facility], [place] [address], [place], [place] [address]   Blood culture (routine single)     Status: None (Preliminary result)   Collection Time: 03/23/20  2:35 PM   Specimen: BLOOD  Result Value Ref Range Status   Specimen Description BLOOD LEFT ANTECUBITAL  Final   Special Requests   Final    BOTTLES DRAWN AEROBIC AND ANAEROBIC Blood Culture adequate volume   Culture   Final    NO GROWTH 3 DAYS Performed at [facility], [place] [address] [place], [place] [address]    Report Status PENDING  Incomplete  Urine culture     Status: Abnormal   Collection Time: 03/23/20  2:35 PM   Specimen: In/Out Cath Urine  Result Value Ref Range Status   Specimen Description IN/OUT CATH URINE  Final   Special Requests   Final    NONE Performed at [facility], [place] [address] [place], [place] [address]    Culture (A)  Final    >=100,000 COLONIES/mL KLEBSIELLA PNEUMONIAE Confirmed Extended Spectrum Beta-Lactamase Producer (ESBL).  In bloodstream infections from ESBL organisms, carbapenems are preferred over piperacillin/tazobactam. They are shown to have a lower risk of mortality.    Report Status 03/26/2020 FINAL  Final   Organism ID,  Bacteria KLEBSIELLA PNEUMONIAE (A)  Final      Susceptibility   Klebsiella pneumoniae - MIC*    AMPICILLIN >=32 RESISTANT Resistant     CEFAZOLIN >=64 RESISTANT Resistant     CEFEPIME >=32 RESISTANT Resistant     CEFTRIAXONE >=64 RESISTANT Resistant     CIPROFLOXACIN 1 SENSITIVE Sensitive     GENTAMICIN <=1 SENSITIVE Sensitive     IMIPENEM <=0.25 SENSITIVE Sensitive     NITROFURANTOIN 64 INTERMEDIATE Intermediate     TRIMETH/SULFA >=320 RESISTANT Resistant     AMPICILLIN/SULBACTAM >=32 RESISTANT Resistant     PIP/TAZO 32 INTERMEDIATE Intermediate     * >=100,000 COLONIES/mL KLEBSIELLA PNEUMONIAE  Culture, blood (single)     Status: None (Preliminary result)   Collection Time: 03/23/20  2:38 PM   Specimen: BLOOD RIGHT ARM  Result Value Ref Range Status   Specimen Description BLOOD RIGHT ARM  Final   Special Requests   Final    BOTTLES DRAWN AEROBIC AND ANAEROBIC Blood Culture adequate volume   Culture   Final    NO GROWTH 3 DAYS Performed at [facility], [address] [place], [place] [address]    Report Status PENDING  Incomplete     Labs:   CBC: Recent Labs  Lab 03/23/20 1311 03/23/20 2330 03/24/20 0407 03/25/20 0500 03/26/20 0355  WBC 21.3* 22.0* 22.1* 13.1* 8.1  NEUTROABS 19.0*  --   --   --   --   HGB 12.3* 11.9* 11.5* 11.9* 11.8*  HCT 37.5* 35.1* 35.8* 35.0* 34.4*  MCV 91.7 91.4 93.5 92.1 89.8  PLT 261 180 244 250 301   Basic Metabolic Panel: Recent Labs  Lab 03/23/20 1311 03/23/20 2330 03/24/20 0407 03/25/20 0558  NA 136  --  135 137  K 3.8  --  3.9 4.1  CL 99  --  100 103  CO2 24  --  24 24  GLUCOSE 204*  --  236* 117*  BUN 14  --  14 14  CREATININE 1.30* 1.21 1.15 1.09  CALCIUM 9.3  --  8.7* 8.9   Liver Function Tests: Recent Labs  Lab 03/23/20 1311 03/24/20 0407  AST 50* 28  ALT 38 27  ALKPHOS 71 75  BILITOT 0.8 0.7  PROT 7.2 6.2*  ALBUMIN 3.7 3.1*   BNP (last 3 results) No results for input(s): BNP in the last 8760  hours. Cardiac Enzymes: No results for input(s): CKTOTAL, CKMB, CKMBINDEX, TROPONINI in the last 168 hours. CBG: Recent Labs  Lab 03/25/20 1218 03/25/20 1658 03/25/20 2049 03/26/20 0631 03/26/20 1129  GLUCAP 109* 138* 142* 93 149*   Hgb A1c Recent Labs    03/23/20 2330  HGBA1C 8.7*   Lipid Profile No results for input(s): CHOL, HDL, LDLCALC, TRIG, CHOLHDL, LDLDIRECT in the last 72 hours. Thyroid function studies No results for input(s): TSH, T4TOTAL, T3FREE, THYROIDAB in the last 72 hours.  Invalid input(s): FREET3 Anemia work up No results for input(s): VITAMINB12, FOLATE, FERRITIN, TIBC, IRON, RETICCTPCT in the last 72 hours. Urinalysis    Component Value Date/Time   COLORURINE YELLOW 03/23/2020 1435   APPEARANCEUR HAZY (A) 03/23/2020 1435   LABSPEC 1.021 03/23/2020 1435   PHURINE 5.0 03/23/2020 1435   GLUCOSEU 150 (A) 03/23/2020 1435   GLUCOSEU NEGATIVE 04/06/2010 1554   HGBUR SMALL (A) 03/23/2020 1435   BILIRUBINUR NEGATIVE 03/23/2020 1435   BILIRUBINUR N 03/24/2016 0905   KETONESUR NEGATIVE 03/23/2020 1435   PROTEINUR 30 (A) 03/23/2020 1435   UROBILINOGEN 0.2 03/24/2016 0905   UROBILINOGEN 1.0 04/03/2014 0058   NITRITE NEGATIVE 03/23/2020 1435   LEUKOCYTESUR SMALL (A) 03/23/2020 1435         Time coordinating discharge: Over 45 minutes  SIGNED: Deatra James, MD, FACP, FHM. [facility],  Please use [email or web address] to Page If 7PM-7AM, please contact night-coverage [email or web address] 03/26/2020, 12:32 PM

## 2020-03-26 NOTE — Progress Notes (Signed)
DISCHARGE NOTE HOME [name] to be discharged Home per MD order. Discussed prescriptions and follow up appointments with the patient. Prescriptions given to patient; medication list explained in detail. Patient verbalized understanding.  Skin clean, dry and intact without evidence of skin break down, no evidence of skin tears noted. IV catheter discontinued intact. Site without signs and symptoms of complications. Dressing and pressure applied. Pt denies pain at the site currently. No complaints noted.  Patient free of lines, drains, and wounds.   An After Visit Summary (AVS) was printed and given to the patient. Patient escorted via wheelchair, and discharged home via private auto.  Dolores Hoose, RN

## 2020-03-26 NOTE — Plan of Care (Signed)
  Problem: Education: Goal: Knowledge of General Education information will improve Description: Including pain rating scale, medication(s)/side effects and non-pharmacologic comfort measures Outcome: Adequate for Discharge   Problem: Health Behavior/Discharge Planning: Goal: Ability to manage health-related needs will improve 03/26/2020 1251 by Dolores Hoose, RN Outcome: Adequate for Discharge 03/26/2020 0804 by Dolores Hoose, RN Outcome: Progressing   Problem: Clinical Measurements: Goal: Ability to maintain clinical measurements within normal limits will improve Outcome: Adequate for Discharge Goal: Will remain free from infection 03/26/2020 1251 by Dolores Hoose, RN Outcome: Adequate for Discharge 03/26/2020 0804 by Dolores Hoose, RN Outcome: Progressing Goal: Diagnostic test results will improve Outcome: Adequate for Discharge Goal: Respiratory complications will improve Outcome: Adequate for Discharge Goal: Cardiovascular complication will be avoided Outcome: Adequate for Discharge   Problem: Activity: Goal: Risk for activity intolerance will decrease Outcome: Adequate for Discharge   Problem: Nutrition: Goal: Adequate nutrition will be maintained Outcome: Adequate for Discharge   Problem: Coping: Goal: Level of anxiety will decrease 03/26/2020 1251 by Dolores Hoose, RN Outcome: Adequate for Discharge 03/26/2020 0804 by Dolores Hoose, RN Outcome: Progressing   Problem: Elimination: Goal: Will not experience complications related to bowel motility Outcome: Adequate for Discharge Goal: Will not experience complications related to urinary retention Outcome: Adequate for Discharge   Problem: Pain Managment: Goal: General experience of comfort will improve Outcome: Adequate for Discharge   Problem: Safety: Goal: Ability to remain free from injury will improve Outcome: Adequate for Discharge   Problem: Skin Integrity: Goal: Risk for impaired  skin integrity will decrease Outcome: Adequate for Discharge   Problem: Fluid Volume: Goal: Hemodynamic stability will improve Outcome: Adequate for Discharge   Problem: Clinical Measurements: Goal: Diagnostic test results will improve Outcome: Adequate for Discharge Goal: Signs and symptoms of infection will decrease Outcome: Adequate for Discharge   Problem: Respiratory: Goal: Ability to maintain adequate ventilation will improve Outcome: Adequate for Discharge   Problem: Fluid Volume: Goal: Hemodynamic stability will improve Outcome: Adequate for Discharge   Problem: Clinical Measurements: Goal: Diagnostic test results will improve Outcome: Adequate for Discharge Goal: Signs and symptoms of infection will decrease Outcome: Adequate for Discharge   Problem: Respiratory: Goal: Ability to maintain adequate ventilation will improve Outcome: Adequate for Discharge

## 2020-03-28 LAB — CULTURE, BLOOD (SINGLE)
Culture: NO GROWTH
Culture: NO GROWTH
Special Requests: ADEQUATE
Special Requests: ADEQUATE

## 2020-03-29 ENCOUNTER — Telehealth: Payer: Self-pay | Admitting: Adult Health

## 2020-03-29 NOTE — Telephone Encounter (Signed)
Transition Care Management Follow-up Telephone Call  Date of discharge and from where: 03/26/2020 from [facility]   How have you been since you were released from the hospital? Patient states he is doing well.  Any questions or concerns? No  Items Reviewed:  Did the pt receive and understand the discharge instructions provided? Yes   Medications obtained and verified? Yes   Other? No   Any new allergies since your discharge? No   Dietary orders reviewed? Yes  Do you have support at home? Yes   Home Care and Equipment/Supplies: Were home health services ordered? not applicable If so, what is the name of the agency? N/A   Has the agency set up a time to come to the patient's home? no Were any new equipment or medical supplies ordered?  No What is the name of the medical supply agency? N/a  Were you able to get the supplies/equipment? not applicable Do you have any questions related to the use of the equipment or supplies? No  Functional Questionnaire: (I = Independent and D = Dependent) ADLs: I  Bathing/Dressing- I  Meal Prep- I  Eating- I  Maintaining continence- I  Transferring/Ambulation- I  Managing Meds- I  Follow up appointments reviewed:   PCP Hospital f/u appt confirmed? Yes  Scheduled to see Janna Arch on 04/02/2020  @ 1:30 PM .  [facility] f/u appt confirmed? No  2  Are transportation arrangements needed? No   If their condition worsens, is the pt aware to call PCP or go to the Emergency Dept.? Yes  Was the patient provided with contact information for the PCP's office or ED? Yes  Was to pt encouraged to call back with questions or concerns? Yes

## 2020-04-01 ENCOUNTER — Other Ambulatory Visit: Payer: Self-pay

## 2020-04-02 ENCOUNTER — Encounter: Payer: Self-pay | Admitting: Adult Health

## 2020-04-02 ENCOUNTER — Ambulatory Visit: Payer: Medicare PPO | Admitting: Adult Health

## 2020-04-02 VITALS — BP 146/80 | Temp 98.5°F | Wt 188.0 lb

## 2020-04-02 DIAGNOSIS — IMO0002 Reserved for concepts with insufficient information to code with codable children: Secondary | ICD-10-CM

## 2020-04-02 DIAGNOSIS — I10 Essential (primary) hypertension: Secondary | ICD-10-CM | POA: Diagnosis not present

## 2020-04-02 DIAGNOSIS — Z8619 Personal history of other infectious and parasitic diseases: Secondary | ICD-10-CM

## 2020-04-02 DIAGNOSIS — E1140 Type 2 diabetes mellitus with diabetic neuropathy, unspecified: Secondary | ICD-10-CM | POA: Diagnosis not present

## 2020-04-02 DIAGNOSIS — K5900 Constipation, unspecified: Secondary | ICD-10-CM

## 2020-04-02 DIAGNOSIS — K047 Periapical abscess without sinus: Secondary | ICD-10-CM | POA: Diagnosis not present

## 2020-04-02 DIAGNOSIS — A419 Sepsis, unspecified organism: Secondary | ICD-10-CM

## 2020-04-02 DIAGNOSIS — Z87440 Personal history of urinary (tract) infections: Secondary | ICD-10-CM

## 2020-04-02 DIAGNOSIS — Z794 Long term (current) use of insulin: Secondary | ICD-10-CM

## 2020-04-02 DIAGNOSIS — E1165 Type 2 diabetes mellitus with hyperglycemia: Secondary | ICD-10-CM

## 2020-04-02 DIAGNOSIS — N390 Urinary tract infection, site not specified: Secondary | ICD-10-CM

## 2020-04-02 MED ORDER — HYDROCHLOROTHIAZIDE 25 MG PO TABS: 25.0000 mg | ORAL_TABLET | Freq: Every day | ORAL | 3 refills | Status: DC

## 2020-04-02 MED ORDER — ROSUVASTATIN CALCIUM 40 MG PO TABS: 40.0000 mg | ORAL_TABLET | Freq: Every day | ORAL | 1 refills | Status: DC

## 2020-04-02 MED ORDER — GLIPIZIDE ER 10 MG PO TB24: 10.0000 mg | ORAL_TABLET | Freq: Two times a day (BID) | ORAL | 0 refills | Status: DC

## 2020-04-02 MED ORDER — LACTULOSE 20 GM/30ML PO SOLN: 15.0000 mL | Freq: Two times a day (BID) | ORAL | 0 refills | Status: DC | PRN

## 2020-04-02 NOTE — Progress Notes (Signed)
Subjective:    Patient ID: [name], male    DOB: [birth year], 75 y.o.   MRN: [id number]  HPI 75 year old male who  has a past medical history of Aortic stenosis, Carpal tunnel syndrome (09/24/2014), Coronary artery disease, COVID-19, Diabetes mellitus, type 2 ([place]), Erectile dysfunction, Essential hypertension, Hyperlipidemia, and Palpitations.  He presents to the office today for a TCM visit  Admit Date:  03/23/2020 Discharge Date: 03/26/2020  He presented to the emergency room with generalized body aches, chills, and severe weakness.  In the ED he is noted to have a temperature of 103.1, heart rate was 102 and respiratory rate was 24.  His white blood cell count was 21.3  UA suggestive of infection, and he admitted to having some mild dysuria along with concentrated urine.  Of note the patient also had swelling of his left face and was darted on antibiotics for a dental abscess.  He took these antibiotics for 4 days and stopped them about a week prior to the ER visit because he began to develop abdominal discomfort and diarrhea.  His tooth was feeling painful in the emergency room  Hospital Course   1. Severe Sepsis  - WBC trending down to 8.1 on d/c  - lactic acid 2.2  -Blood cultures were negative -Urine culture growing K pneumoniae-today drug-resistant with exceptions to Cipro, gentamicin, and imipenem.  -Started on Cipro 500 mg twice daily for 5 days  2. Type II diabetes mellitus with neurological manifestations  -Was continued on insulin sliding scale and Lantus while admitted - Currently prescribed Glipizide 10 mg ER daily, Metformin 1000 mg BID and Lantus 32 units.  Lab Results  Component Value Date   HGBA1C 8.7 (H) 03/23/2020    3.  Hyperlipidemia -Continue with Crestor  4.  Central hypertension with history of 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 -Continue with Metoprolol - HCTZ was held  5. Hx of CABG  - Continue with ASA and stain   Today in the  office he reports that overall he is feeling pretty good, continues to be slightly fatigued.  Denies fevers or chills, dysuria or hematuria.  His biggest complaint is that of being constipated since being out of the hospital.  Has had one single small bowel movement over the last week.  He has tried over-the-counter magnesium citrate which did not help.  Denies nausea or vomiting.  Has intermittent abdominal pain.  He is looking for a dentist in order to get his tooth pulled.   Review of Systems  Constitutional: Positive for fatigue.  HENT: Positive for dental problem.   Eyes: Negative.   Respiratory: Negative.   Cardiovascular: Negative.   Gastrointestinal: Positive for abdominal pain and constipation. Negative for anal bleeding, blood in stool and rectal pain.  Endocrine: Negative.   Genitourinary: Negative.   Musculoskeletal: Negative.   Skin: Negative.   Allergic/Immunologic: Negative.   Neurological: Negative.   Hematological: Negative.   Psychiatric/Behavioral: Negative.   All other systems reviewed and are negative.  Past Medical History:  Diagnosis Date  . 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 Carpal tunnel syndrome 09/24/2014   Bilateral  . 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 COVID-19   . Diabetes mellitus, type 2 ([place])   . Erectile dysfunction   . Essential hypertension   . Hyperlipidemia   . Palpitations    Cardiac monitor 03/2019: normal sinus rhythm, no AF,  rare NSVT, VT    Social History   Socioeconomic History  . Marital status: Widowed    Spouse name: Not on file  . Number of children: 5  . Years of education: 10   . Highest education level: 10th grade  Occupational History  . Occupation: custodian    Comment: PT  Tobacco Use  . Smoking status: Never Smoker  . Smokeless tobacco: Never Used  Vaping Use  . Vaping Use: Never used  Substance and Sexual Activity  . Alcohol use: Yes     Alcohol/week: 1.0 standard drink    Types: 1 Glasses of wine per week    Comment: 1 x month  . Drug use: No  . Sexual activity: Yes  Other Topics Concern  . Not on file  Social History Narrative   Widowed to [name] for 67 years; 2 daughters liver with him now   Works part time as Chiropodist   5 children; 1 son deceased   Never Smoked   Alcohol use- no   Caffeine use: occasional       Lost one child a few months ago    Lost his sister, then son and then nephew - all within 2 weeks apart   Son had a stroke at 33    Was planning to get married last month in Feb.    Social Determinants of Health   Financial Resource Strain: [place]   . Difficulty of Paying Living Expenses: Not hard at all  Food Insecurity: No Food Insecurity  . Worried About Charity fundraiser in the Last Year: Never true  . Ran Out of Food in the Last Year: Never true  Transportation Needs: No Transportation Needs  . Lack of Transportation (Medical): No  . Lack of Transportation (Non-Medical): No  Physical Activity: Insufficiently Active  . Days of Exercise per Week: 3 days  . Minutes of Exercise per Session: 20 min  Stress: No Stress Concern Present  . Feeling of Stress : Not at all  Social Connections: Moderately Isolated  . Frequency of Communication with Friends and Family: More than three times a week  . Frequency of Social Gatherings with Friends and Family: More than three times a week  . Attends Religious Services: More than 4 times per year  . Active Member of Clubs or Organizations: No  . Attends Archivist Meetings: Never  . Marital Status: Widowed  Intimate Partner Violence: Not At Risk  . Fear of Current or Ex-Partner: No  . Emotionally Abused: No  . Physically Abused: No  . Sexually Abused: No    Past Surgical History:  Procedure Laterality Date  . AORTIC VALVE REPLACEMENT N/A 10/29/2015   Procedure: AORTIC VALVE REPLACEMENT (AVR) WITH 23MM MAGNA EASE TISSUE  VALVE.;  Surgeon: [name], MD;  Location: [facility];  Service: Open Heart Surgery;  Laterality: N/A;  . CARDIAC CATHETERIZATION N/A 10/27/2015   Procedure: Left Heart Cath and Coronary Angiography;  Surgeon: [name], MD;  Location: [facility];  Service: Cardiovascular;  Laterality: N/A;  . CORONARY ARTERY BYPASS GRAFT N/A 10/29/2015   Procedure: CORONARY ARTERY BYPASS GRAFTING (CABG) x Four UTILIZING THE LEFT INTERNAL MAMMARY ARTERY AND ENDOSCOPICALLY HARVESTED RIGHT SAPEHENEOUS VEINS.;  Surgeon: [name], MD;  Location: [facility];  Service: Open Heart Surgery;  Laterality: N/A;  . CYST REMOVAL HAND Right   . ESOPHAGOGASTRODUODENOSCOPY N/A 12/02/2015   Procedure: ESOPHAGOGASTRODUODENOSCOPY (EGD);  Surgeon: Renelda Loma Armbruster,  MD;  Location: [facility];  Service: Gastroenterology;  Laterality: N/A;  . REPLACEMENT ASCENDING AORTA N/A 10/29/2015   Procedure: REPLACEMENT OF ASCENDING AORTA USING 34MM X 30CM WOVEN DOUBLE VELOUR VASCULAR GRAFT.;  Surgeon: [name], MD;  Location: [facility];  Service: Open Heart Surgery;  Laterality: N/A;  . TEE WITHOUT CARDIOVERSION N/A 10/29/2015   Procedure: TRANSESOPHAGEAL ECHOCARDIOGRAM (TEE);  Surgeon: [name], MD;  Location: [facility];  Service: Open Heart Surgery;  Laterality: N/A;  . UVULECTOMY N/A 12/10/2015   Procedure: UVULECTOMY;  Surgeon: [name], MD;  Location: [name];  Service: ENT;  Laterality: N/A;    Family History  Problem Relation Age of Onset  . Cancer Father        Throat cancer  . Diabetes Sister     Allergies  Allergen Reactions  . Amiodarone Nausea And Vomiting  . Quinolones     Patient was warned about not using Cipro and similar antibiotics. Recent studies have raised concern that fluoroquinolone antibiotics could be associated with an increased risk of aortic aneurysm Fluoroquinolones have non-antimicrobial properties that might jeopardise the integrity of the extracellular matrix of the  vascular wall In a  propensity score matched cohort study in [place], there was a 66% increased rate of aortic aneurysm or dissection associated with oral fluoroquinolone use, compared wit    Current Outpatient Medications on File Prior to Visit  Medication Sig Dispense Refill  . albuterol (VENTOLIN HFA) 108 (90 Base) MCG/ACT inhaler Inhale 1-2 puffs into the lungs 2 (two) times daily as needed for wheezing or shortness of breath. 8 g 0  . aspirin EC 81 MG tablet Take 81 mg by mouth daily.    . Blood Glucose Monitoring Suppl (ACCU-CHEK GUIDE ME) w/Device KIT 1 Stick by Does not apply route 3 (three) times daily. Used to check blood sugar 3 times daily (Patient taking differently: 1 Stick by Does not apply route in the morning and at bedtime. Used to check blood sugar 2 times daily) 1 kit 0  . glipiZIDE (GLUCOTROL XL) 10 MG 24 hr tablet Take 1 tablet (10 mg total) by mouth daily with breakfast. 90 tablet 0  . glucose blood (ACCU-CHEK GUIDE) test strip Use as instructed 100 each 1  . insulin glargine (LANTUS) 100 UNIT/ML Solostar Pen Inject 32 Units into the skin at bedtime.    . Insulin Pen Needle (B-D ULTRAFINE III SHORT PEN) 31G X 8 MM MISC Use QHS 90 each 3  . metFORMIN (GLUCOPHAGE) 1000 MG tablet Take 1 tablet (1,000 mg total) by mouth 2 (two) times daily with a meal. 180 tablet 0  . metoprolol tartrate (LOPRESSOR) 25 MG tablet TAKE 1 TABLET BY MOUTH TWICE A DAY (Patient taking differently: Take 25 mg by mouth 2 (two) times daily.) 180 tablet 1  . rosuvastatin (CRESTOR) 20 MG tablet Take 40 mg by mouth at bedtime.     No current facility-administered medications on file prior to visit.    BP (!) 146/80   Temp 98.5 F (36.9 C)   Wt 188 lb (85.3 kg)   BMI 25.50 kg/m       Objective:   Physical Exam Vitals and nursing note reviewed.  Constitutional:      General: He is not in acute distress.    Appearance: Normal appearance. He is well-developed and normal weight.  HENT:     Head:  Normocephalic and atraumatic.     Right Ear: Tympanic membrane, ear canal and external ear  normal. There is no impacted cerumen.     Left Ear: Tympanic membrane, ear canal and external ear normal. There is no impacted cerumen.     Nose: Nose normal. No congestion or rhinorrhea.     Mouth/Throat:     Mouth: Mucous membranes are moist.     Dentition: Abnormal dentition.     Pharynx: Oropharynx is clear. No oropharyngeal exudate or posterior oropharyngeal erythema.  Eyes:     General:        Right eye: No discharge.        Left eye: No discharge.     Extraocular Movements: Extraocular movements intact.     Conjunctiva/sclera: Conjunctivae normal.     Pupils: Pupils are equal, round, and reactive to light.  Neck:     Vascular: No carotid bruit.     Trachea: No tracheal deviation.  Cardiovascular:     Rate and Rhythm: Normal rate and regular rhythm.     Pulses: Normal pulses.     Heart sounds: Normal heart sounds. No murmur heard. No friction rub. No gallop.   Pulmonary:     Effort: Pulmonary effort is normal. No respiratory distress.     Breath sounds: Normal breath sounds. No stridor. No wheezing, rhonchi or rales.  Chest:     Chest wall: No tenderness.  Abdominal:     General: Bowel sounds are normal. There is distension.     Palpations: Abdomen is soft. There is no mass.     Tenderness: There is no abdominal tenderness. There is no right CVA tenderness, left CVA tenderness, guarding or rebound.     Hernia: No hernia is present.  Musculoskeletal:        General: No swelling, tenderness, deformity or signs of injury. Normal range of motion.     Right lower leg: No edema.     Left lower leg: No edema.  Lymphadenopathy:     Cervical: No cervical adenopathy.  Skin:    General: Skin is warm and dry.     Capillary Refill: Capillary refill takes less than 2 seconds.     Coloration: Skin is not jaundiced or pale.     Findings: No bruising, erythema, lesion or rash.  Neurological:      General: No focal deficit present.     Mental Status: He is alert and oriented to person, place, and time.     Cranial Nerves: No cranial nerve deficit.     Sensory: No sensory deficit.     Motor: No weakness.     Coordination: Coordination normal.     Gait: Gait normal.     Deep Tendon Reflexes: Reflexes normal.  Psychiatric:        Mood and Affect: Mood normal.        Behavior: Behavior normal.        Thought Content: Thought content normal.        Judgment: Judgment normal.       Assessment & Plan:  1. Sepsis secondary to UTI [facility]) -Reviewed hospital notes, discharge instructions, imaging, and labs done while in the hospital.  All questions answered to the best of my ability.  Symptoms have seemed to resolve.  Patient looks well and in no acute distress.  2. Uncontrolled type 2 diabetes mellitus with diabetic neuropathy, with long-term current use of insulin (HCC) - will increase glipizide to 10 mg BID for tighter BS control.  - Follow up in three months  - glipiZIDE (GLUCOTROL XL) 10 MG 24  hr tablet; Take 1 tablet (10 mg total) by mouth in the morning and at bedtime.  Dispense: 180 tablet; Refill: 0  3. Essential hypertension  - hydrochlorothiazide (HYDRODIURIL) 25 MG tablet; Take 1 tablet (25 mg total) by mouth daily.  Dispense: 90 tablet; Refill: 3  4. Dental abscess -Information given for a 1 dental in order to have an extraction done  5. Constipation, unspecified constipation type -We will prescribe short course of lactulose.  He was advised to get a fleets enema at the [employer] and use that.  Follow-up if no bowel movement within the next 24 hours - Lactulose 20 GM/30ML SOLN; Take 15 mLs (10 g total) by mouth 2 (two) times daily as needed.  Dispense: 236 mL; Refill: 0   [name], NP

## 2020-04-02 NOTE — Patient Instructions (Signed)
It was great seeing you today   Please contact [facility] for the tooth extraction   Address: [address], [place], [place] [address] Phone: [phone]  I have sent in Lactulose to help with constipation - use this until you have a bowel movement. Also use a Fleets Enema tonight   I have increase Glipizide to 10 mg twice a day - follow up in 3 months

## 2020-05-04 ENCOUNTER — Other Ambulatory Visit: Payer: Self-pay | Admitting: Adult Health

## 2020-05-06 ENCOUNTER — Ambulatory Visit
Admission: RE | Admit: 2020-05-06 | Discharge: 2020-05-06 | Disposition: A | Discharge status: Home / Self Care | Payer: Medicare PPO | Source: Ambulatory Visit | Attending: Cardiothoracic Surgery | Admitting: Cardiothoracic Surgery

## 2020-05-06 ENCOUNTER — Other Ambulatory Visit: Payer: Self-pay

## 2020-05-06 ENCOUNTER — Ambulatory Visit: Payer: Medicare PPO | Admitting: Cardiothoracic Surgery

## 2020-05-06 VITALS — BP 145/72 | HR 67 | Temp 97.6°F | Resp 20 | Ht 72.0 in | Wt 183.0 lb

## 2020-05-06 DIAGNOSIS — I712 Thoracic aortic aneurysm, without rupture, unspecified: Secondary | ICD-10-CM

## 2020-05-06 DIAGNOSIS — Z952 Presence of prosthetic heart valve: Secondary | ICD-10-CM

## 2020-05-06 MED ORDER — IOPAMIDOL (ISOVUE-370) INJECTION 76%
75.0000 mL | Freq: Once | INTRAVENOUS | Status: AC | PRN
  Administered 2020-05-06: 75 mL via INTRAVENOUS

## 2020-05-06 NOTE — Progress Notes (Signed)
    [address]       [place] [address]             [phone]      Bolivar R Trego Parker Medical Record #[id number] Date of Birth: 05/16/1945  Referring: [name], Philip J, MD Primary Care: Nafziger, Cory, NP  Chief Complaint:   POST OP FOLLOW UP 10/29/2015   OPERATIVE REPORT  PREOPERATIVE DIAGNOSES:  Severe 3-vessel coronary occlusive disease. Bicuspid aortic valve with moderate to severe aortic stenosis.  Dilated ascending aorta 4.7 cm. PROCEDURE PERFORMED: 1. Coronary artery bypass grafting x4 with left internal mammary to     the left anterior descending coronary artery, sequential reverse     saphenous vein graft to the first and second obtuse marginal,     reverse saphenous vein graft to the distal right coronary artery     with right vein greater saphenous endo vein harvesting. 2. Replacement of aortic valve with pericardial tissue valve Edwards     [employer], model 3300 TFX 23 mm, serial #[id number] supra coronary     replacement of ascending aorta with 34-mm Hemashield graft,     Wheat procedure. SURGEON:  Edward Gerhardt, MD.  History of Present Illness:     Patient returns for follow-up after replacement of his ascending aorta and aortic valve and coronary artery bypass grafting x4 done September 2017.  Since last seen 18 months ago, he had a admission to the hospital just last month with sepsis Klebsiella in his urine and cellulitis in his right face and neck.  He notes that he is recovered without sequelae.  On review of the chart it does not appear that he had any evaluation of his aortic valve or possibility of valvular endocarditis while he was hospitalized blood cultures were negative.  Steichen read from the chart he was given IV Cipro in spite of the black box warning about quinolones and patients with significant aortic disease.  He has no lower teeth but noted noted a fragment of a sharp edge of a degree to which itself into his  right lower gum and the symptoms subsequently developed facial cellulitis    Past Surgical History:  Procedure Laterality Date  . AORTIC VALVE REPLACEMENT N/A 10/29/2015   Procedure: AORTIC VALVE REPLACEMENT (AVR) WITH 23MM MAGNA EASE TISSUE VALVE.;  Surgeon: Edward B Gerhardt, MD;  Location: [facility] OR;  Service: Open Heart Surgery;  Laterality: N/A;  . CARDIAC CATHETERIZATION N/A 10/27/2015   Procedure: Left Heart Cath and Coronary Angiography;  Surgeon: David W Harding, MD;  Location: [facility] IN[facility]AS[facility];  Service: Cardiovascular;  Laterality: N/A;  . CORONARY ARTERY BYPASS GRAFT N/A 10/29/2015   Procedure: CORONARY ARTERY BYPASS GRAFTING (CABG) x Four UTILIZING THE LEFT INTERNAL MAMMARY ARTERY AND ENDOSCOPICALLY HARVESTED RIGHT SAPEHENEOUS VEINS.;  Surgeon: Edward B Gerhardt, MD;  Location: [facility] OR;  Service: Open Heart Surgery;  Laterality: N/A;  . CYST REMOVAL HAND Right   . ESOPHAGOGASTRODUODENOSCOPY N/A 12/02/2015   Procedure: ESOPHAGOGASTRODUODENOSCOPY (EGD);  Surgeon: Steven Paul Armbruster, MD;  Location: [facility];  Service: Gastroenterology;  Laterality: N/A;  . REPLACEMENT ASCENDING AORTA N/A 10/29/2015   Procedure: REPLACEMENT OF ASCENDING AORTA USING 34MM X 30CM WOVEN DOUBLE VELOUR VASCULAR GRAFT.;  Surgeon: Edward B Gerhardt, MD;  Location: [facility] OR;  Service: Open Heart Surgery;  Laterality: N/A;  . TEE WITHOUT CARDIOVERSION N/A 10/29/2015   Procedure: TRANSESOPHAGEAL ECHOCARDIOGRAM (TEE);  Surgeon: Edward B Gerhardt, MD;  Location: [facility];  Service: Open   Heart Surgery;  Laterality: N/A;  . UVULECTOMY N/A 12/10/2015   Procedure: UVULECTOMY;  Surgeon: [name], MD;  Location: [facility];  Service: ENT;  Laterality: N/A;     Past Medical History:  Diagnosis Date  . 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 Carpal tunnel syndrome 09/24/2014   Bilateral  . 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 COVID-19   . Diabetes mellitus,  type 2 ([place])   . Erectile dysfunction   . Essential hypertension   . Hyperlipidemia   . Palpitations    Cardiac monitor 03/2019: normal sinus rhythm, no AF, rare NSVT, VT     Social History   Tobacco Use  Smoking Status Never Smoker  Smokeless Tobacco Never Used    Social History   Substance and Sexual Activity  Alcohol Use Yes  . Alcohol/week: 1.0 standard drink  . Types: 1 Glasses of wine per week   Comment: 1 x month     Allergies  Allergen Reactions  . Amiodarone Nausea And Vomiting  . Quinolones     Patient was warned about not using Cipro and similar antibiotics. Recent studies have raised concern that fluoroquinolone antibiotics could be associated with an increased risk of aortic aneurysm Fluoroquinolones have non-antimicrobial properties that might jeopardise the integrity of the extracellular matrix of the vascular wall In a  propensity score matched cohort study in [place], there was a 66% increased rate of aortic aneurysm or dissection associated with oral fluoroquinolone use, compared wit    Current Outpatient Medications  Medication Sig Dispense Refill  . albuterol (VENTOLIN HFA) 108 (90 Base) MCG/ACT inhaler Inhale 1-2 puffs into the lungs 2 (two) times daily as needed for wheezing or shortness of breath. 8 g 0  . aspirin EC 81 MG tablet Take 81 mg by mouth daily.    . Blood Glucose Monitoring Suppl (ACCU-CHEK GUIDE ME) w/Device KIT 1 Stick by Does not apply route 3 (three) times daily. Used to check blood sugar 3 times daily (Patient taking differently: 1 Stick by Does not apply route in the morning and at bedtime. Used to check blood sugar 2 times daily) 1 kit 0  . glipiZIDE (GLUCOTROL XL) 10 MG 24 hr tablet Take 1 tablet (10 mg total) by mouth in the morning and at bedtime. 180 tablet 0  . glucose blood (ACCU-CHEK GUIDE) test strip Use as instructed 100 each 1  . hydrochlorothiazide (HYDRODIURIL) 25 MG tablet Take 1 tablet (25 mg total) by mouth daily. 90  tablet 3  . Insulin Pen Needle (B-D ULTRAFINE III SHORT PEN) 31G X 8 MM MISC Use QHS 90 each 3  . Lactulose 20 GM/30ML SOLN Take 15 mLs (10 g total) by mouth 2 (two) times daily as needed. 236 mL 0  . LANTUS SOLOSTAR 100 UNIT/ML Solostar Pen INJECT 23 UNITS INTO THE SKIN DAILY. 18 mL 0  . metFORMIN (GLUCOPHAGE) 1000 MG tablet Take 1 tablet (1,000 mg total) by mouth 2 (two) times daily with a meal. 180 tablet 0  . metoprolol tartrate (LOPRESSOR) 25 MG tablet TAKE 1 TABLET BY MOUTH TWICE A DAY (Patient taking differently: Take 25 mg by mouth 2 (two) times daily.) 180 tablet 1  . rosuvastatin (CRESTOR) 40 MG tablet Take 1 tablet (40 mg total) by mouth at bedtime. 90 tablet 1   No current facility-administered medications for this visit.       Physical Exam: BP [name] Kitchen)  145/72   Pulse 67   Temp 97.6 F (36.4 C) (Skin)   Resp 20   Ht 6' (1.829 m)   Wt 183 lb (83 kg)   SpO2 97% Comment: RA  BMI 24.82 kg/m   General appearance: alert, cooperative and no distress Head: Normocephalic, without obvious abnormality, atraumatic Neck: no adenopathy, no carotid bruit, no JVD, supple, symmetrical, trachea midline and thyroid not enlarged, symmetric, no tenderness/mass/nodules Lymph nodes: Cervical, supraclavicular, and axillary nodes normal. Resp: clear to auscultation bilaterally Cardio: regular rate and rhythm, S1, S2 normal, no murmur, click, rub or gallop GI: soft, non-tender; bowel sounds normal; no masses,  no organomegaly Extremities: extremities normal, atraumatic, no cyanosis or edema and Homans sign is negative, no sign of DVT Neurologic: Grossly normal    Diagnostic Studies & Laboratory data:     Recent Radiology Findings:  CT ANGIO CHEST AORTA W/CM & OR WO/CM  Result Date: 05/06/2020 CLINICAL DATA:  Thoracic aortic prominence EXAM: CT ANGIOGRAPHY CHEST WITH CONTRAST TECHNIQUE: Multidetector CT imaging of the chest was performed using the standard protocol during bolus administration  of intravenous contrast. Multiplanar CT image reconstructions and MIPs were obtained to evaluate the vascular anatomy. CONTRAST:  53m ISOVUE-370 IOPAMIDOL (ISOVUE-370) INJECTION 76% COMPARISON:  February 18, 2019 FINDINGS: Cardiovascular: Patient is status post aortic valve replacement with surgery involving the proximal ascending thoracic aorta, unchanged. There is again noted dilatation of the proximal thoracic aortic arch slightly proximal to the origins of the great vessels with a current maximum measured transverse diameter in this area on coronal images of 4.4 cm, essentially stable compared to prior study. The measured diameter of the proximal aortic arch on axial images measures 4.2 cm, stable. No new aortic prominence noted. The measured diameter in the mid ascending aorta measures 3.9 cm. No dissection is evident. Visualized great vessels appear unremarkable. There are foci of aortic atherosclerosis. There are foci of coronary artery calcification. No pericardial effusion or pericardial thickening is evident. There is no demonstrable pulmonary embolus. Mediastinum/Nodes: Visualized thyroid appears normal. There is no appreciable thoracic adenopathy. No esophageal lesions are evident. Lungs/Pleura: There is no appreciable edema or airspace opacity. There are stable 1-2 mm nodular opacities in the right middle lobe. No appreciable pleural effusions. No pneumothorax. Trachea and major bronchial structures appear patent. Upper Abdomen: Visualized upper abdominal structures appear unremarkable apart from upper abdominal aortic atherosclerosis. Musculoskeletal: No blastic or lytic bone lesions. Patient is status post median sternotomy. No chest wall lesions. Review of the MIP images confirms the above findings. IMPRESSION: 1. Stable prominence of the distal most aspect of the ascending aorta and proximal aortic arch with a maximum measured diameter 4.4 cm. Status post aortic valve replacement with surgery  involving the proximal aorta. No dissection. Foci of aortic atherosclerosis noted. Multiple foci of coronary artery calcification noted. Recommend semi-annual imaging followup by CTA or MRA and referral to cardiothoracic surgery if not already obtained. This recommendation follows 2010 ACCF/AHA/AATS/ACR/ASA/SCA/SCAI/SIR/STS/SVM Guidelines for the Diagnosis and Management of Patients With Thoracic Aortic Disease. Circulation. 2010; 121:: K599-J57 Aortic aneurysm NOS (ICD10-I71.9). 2.  No evident pulmonary embolus. 3. No edema or airspace opacity. Stable 1-2 mm nodules in the right middle lobe. No pleural effusions. 4.  No evident adenopathy. Aortic Atherosclerosis (ICD10-I70.0). Electronically Signed   By: WLowella GripIII M.D.   On: 05/06/2020 14:37    Ct Angio Chest Aorta W &/or Wo Contrast  Result Date: 11/07/2018 CLINICAL DATA:  75year old male with history of thoracic aortic aneurysm.  EXAM: CT ANGIOGRAPHY CHEST WITH CONTRAST TECHNIQUE: Multidetector CT imaging of the chest was performed using the standard protocol during bolus administration of intravenous contrast. Multiplanar CT image reconstructions and MIPs were obtained to evaluate the vascular anatomy. CONTRAST:  75mL ISOVUE-370 IOPAMIDOL (ISOVUE-370) INJECTION 76% COMPARISON:  Chest CT 05/02/2018. FINDINGS: Cardiovascular: Status post median sternotomy for replacement of the ascending thoracic aorta and aortic valve, as well as CABG including LIMA to the LAD. Ectasia of the proximal aortic arch measuring 4.2 cm in diameter. Bovine type thoracic aortic arch (normal anatomical variant) incidentally noted. There is aortic atherosclerosis, as well as atherosclerosis of the great vessels of the mediastinum and the coronary arteries, including calcified atherosclerotic plaque in the left main, left anterior descending, left circumflex and right coronary arteries. Mediastinum/Nodes: No pathologically enlarged mediastinal or hilar lymph nodes.  Esophagus is unremarkable in appearance. No axillary lymphadenopathy. Lungs/Pleura: No suspicious appearing pulmonary nodules or masses are noted. No acute consolidative airspace disease. No pleural effusions. 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 Aortic atherosclerosis. Musculoskeletal: Median sternotomy wires. There are no aggressive appearing lytic or blastic lesions noted in the visualized portions of the skeleton. Review of the MIP images confirms the above findings. IMPRESSION: 1. Postoperative changes of Bentall procedure, without acute complicating features. 2. Ectasia of proximal aortic arch measuring 4.2 cm in diameter, similar to the prior examination. 3. Aortic atherosclerosis, in addition to left main and 3 vessel coronary artery disease. Status post median sternotomy for CABG including LIMA to the LAD. Aortic Atherosclerosis (ICD10-I70.0). Electronically Signed   By: Daniel  Entrikin M.D.   On: 11/07/2018 14:42     Ct Angio Chest Aorta W/cm &/or Wo/cm  Result Date: 04/13/2016 CLINICAL DATA:  Follow-up thoracic aortic aneurysm. History of open heart surgery. EXAM: CT ANGIOGRAPHY CHEST WITH CONTRAST TECHNIQUE: Multidetector CT imaging of the chest was performed using the standard protocol during bolus administration of intravenous contrast. Multiplanar CT image reconstructions and MIPs were obtained to evaluate the vascular anatomy. CONTRAST:  75mL ISOVUE-300 IOPAMIDOL (ISOVUE-300) INJECTION 61% COMPARISON:  08/27/2015; 03/31/2011 FINDINGS: Vascular Findings: Post median sternotomy, CABG, open aortic valve and ascending thoracic aortic repair. The patient is undergone open aortic valve repair as well as replacement of the ascending thoracic aorta without evidence of complication, specifically, no evidence of extravasation or vessel dissection. There is grossly unchanged aneurysmal dilatation of the residual ascending thoracic aorta as well as  the proximal aspect of the aortic arch with measurements as follows. The thoracic aorta tapers to a normal caliber at the level of the mid/ distal aspect of the aortic arch. Minimal amount of slightly irregular mixed calcified and noncalcified atherosclerotic plaque within normal caliber descending thoracic aorta. Bovine configuration of the aortic arch. The branch vessels of the aortic arch appear widely patent throughout their imaged course. Borderline cardiomegaly. Calcifications within the native coronary arteries. Trace amount of pericardial fluid, presumably physiologic. Although this examination was not tailored for the evaluation the pulmonary arteries, there are no discrete filling defects within the central pulmonary arterial tree to suggest central pulmonary embolism. Normal caliber the main pulmonary artery. ------------------------------------------------------------- Thoracic aortic measurements: Sinotubular junction 34 mm as measured in greatest oblique coronal dimension. Proximal ascending aorta 36 mm as measured in greatest oblique axial dimension at the level of the main pulmonary artery. 44 mm at the level of the more cranial ascending thoracic aorta (sagittal image 79, series 602), unchanged since the 2013 examination, previously, 47 mm Aortic   arch aorta 30 mm as measured in greatest oblique sagittal dimension. Proximal descending thoracic aorta 28 mm as measured in greatest oblique axial dimension at the level of the main pulmonary artery. Distal descending thoracic aorta 23 mm as measured in greatest oblique axial dimension at the level of the diaphragmatic hiatus. Review of the MIP images confirms the above findings. ------------------------------------------------------------- Non-Vascular Findings: Mediastinum/Lymph Nodes: No bulky mediastinal, hilar axillary lymphadenopathy. Lungs/Pleura: Slightly nodular pleuroparenchymal thickening about the anterior nondependent portion of the bilateral  upper lobes with associated scattered pleural calcifications are similar to the 03/2011 examination. Punctate (approximately 0.4 cm) noncalcified subpleural nodule within the superior segment of the right lower lobe (image 49, series 4), similar to the 03/2011 examination. No new focal airspace opacities. No pleural effusion or pneumothorax. The central pulmonary airways appear widely patent. 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 Stigmata of DISH within the caudal aspect of the thoracic spine. Mild bilateral gynecomastia.  Regional soft tissues appear normal. IMPRESSION: 1. Post open repair of the ascending thoracic aorta without evidence of complication. 2. 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 3. Stable pleural calcifications, likely the sequela of prior asbestos exposure and similar to the 03/2011 examination. 4. 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 5.  Aortic Atherosclerosis (ICD10-170.0) 6.  Aortic aneurysm NOS (ICD10-I71.9) Electronically Signed   By: John  Watts M.D.   On: 04/13/2016 15:45     Recent Lab Findings: Lab Results  Component Value Date   WBC 8.1 03/26/2020   HGB 11.8 (L) 03/26/2020   HCT 34.4 (L) 03/26/2020   PLT 237 03/26/2020   GLUCOSE 117 (H) 03/25/2020   CHOL 226 (H) 11/06/2019   TRIG 94 11/06/2019   HDL 96 11/06/2019   LDLDIRECT 112.6 05/14/2012   LDLCALC 110 (H) 11/06/2019   ALT 27 03/24/2020   AST 28 03/24/2020   NA 137 03/25/2020   K 4.1 03/25/2020   CL 103 03/25/2020   CREATININE 1.09  03/25/2020   BUN 14 03/25/2020   CO2 24 03/25/2020   TSH 1.58 11/06/2019   INR 1.4 (H) 03/24/2020   HGBA1C 8.7 (H) 03/23/2020      Assessment / Plan:  1/The patient was again reminded about antibiotic prophylaxis around invasive procedures with his prosthetic valve.-He notes that he has a full set of dentures 2/ stable ascending aortic replacement and stable aortic arch following aortic valve replacement , and supra coronary replacement of the ascending aorta and coronary artery bypass grafting  Plan CTA of the chest 12  months.  Patient was reminded to be diligent about his blood pressure medications, avoid quinolones , strenuous lifting       Edward B Gerhardt MD      [address] [place] [address] Office [phone]   Beeper [phone]  05/07/2020 2:46 PM      

## 2020-05-07 DIAGNOSIS — I712 Thoracic aortic aneurysm, without rupture, unspecified: Secondary | ICD-10-CM | POA: Insufficient documentation

## 2020-05-14 DIAGNOSIS — S0502XA Injury of conjunctiva and corneal abrasion without foreign body, left eye, initial encounter: Secondary | ICD-10-CM | POA: Diagnosis not present

## 2020-05-27 DIAGNOSIS — E113311 Type 2 diabetes mellitus with moderate nonproliferative diabetic retinopathy with macular edema, right eye: Secondary | ICD-10-CM | POA: Diagnosis not present

## 2020-06-21 DIAGNOSIS — E113312 Type 2 diabetes mellitus with moderate nonproliferative diabetic retinopathy with macular edema, left eye: Secondary | ICD-10-CM | POA: Diagnosis not present

## 2020-06-24 ENCOUNTER — Other Ambulatory Visit: Payer: Self-pay | Admitting: Adult Health

## 2020-06-24 DIAGNOSIS — IMO0002 Reserved for concepts with insufficient information to code with codable children: Secondary | ICD-10-CM

## 2020-06-24 DIAGNOSIS — E1140 Type 2 diabetes mellitus with diabetic neuropathy, unspecified: Secondary | ICD-10-CM

## 2020-06-30 ENCOUNTER — Ambulatory Visit: Payer: Medicare PPO | Admitting: Adult Health

## 2020-07-07 DIAGNOSIS — E113311 Type 2 diabetes mellitus with moderate nonproliferative diabetic retinopathy with macular edema, right eye: Secondary | ICD-10-CM | POA: Diagnosis not present

## 2020-07-08 ENCOUNTER — Encounter: Payer: Self-pay | Admitting: Adult Health

## 2020-07-08 ENCOUNTER — Ambulatory Visit: Payer: Medicare PPO | Admitting: Adult Health

## 2020-07-08 ENCOUNTER — Other Ambulatory Visit: Payer: Self-pay

## 2020-07-08 VITALS — BP 130/78 | HR 71 | Temp 98.2°F | Ht 72.0 in | Wt 184.0 lb

## 2020-07-08 DIAGNOSIS — Z794 Long term (current) use of insulin: Secondary | ICD-10-CM | POA: Diagnosis not present

## 2020-07-08 DIAGNOSIS — E1140 Type 2 diabetes mellitus with diabetic neuropathy, unspecified: Secondary | ICD-10-CM | POA: Diagnosis not present

## 2020-07-08 DIAGNOSIS — IMO0002 Reserved for concepts with insufficient information to code with codable children: Secondary | ICD-10-CM

## 2020-07-08 DIAGNOSIS — R350 Frequency of micturition: Secondary | ICD-10-CM | POA: Diagnosis not present

## 2020-07-08 DIAGNOSIS — E1165 Type 2 diabetes mellitus with hyperglycemia: Secondary | ICD-10-CM

## 2020-07-08 LAB — POCT URINALYSIS DIPSTICK
Bilirubin, UA: NEGATIVE
Blood, UA: NEGATIVE
Glucose, UA: NEGATIVE
Ketones, UA: NEGATIVE
Nitrite, UA: NEGATIVE
Protein, UA: POSITIVE — AB
Spec Grav, UA: 1.015 (ref 1.010–1.025)
Urobilinogen, UA: 0.2 E.U./dL
pH, UA: 6 (ref 5.0–8.0)

## 2020-07-08 LAB — POCT GLYCOSYLATED HEMOGLOBIN (HGB A1C): Hemoglobin A1C: 8.5 % — AB (ref 4.0–5.6)

## 2020-07-08 MED ORDER — LANTUS SOLOSTAR 100 UNIT/ML ~~LOC~~ SOPN: 36.0000 [IU] | PEN_INJECTOR | Freq: Every day | SUBCUTANEOUS | 0 refills | Status: DC

## 2020-07-08 MED ORDER — METFORMIN HCL 1000 MG PO TABS: 1000.0000 mg | ORAL_TABLET | Freq: Two times a day (BID) | ORAL | 0 refills | Status: DC

## 2020-07-08 MED ORDER — CIPROFLOXACIN HCL 500 MG PO TABS: 500.0000 mg | ORAL_TABLET | Freq: Two times a day (BID) | ORAL | 0 refills | Status: AC

## 2020-07-08 NOTE — Progress Notes (Signed)
Subjective:    Patient ID: [name], male    DOB: 11-04-1945, 75 y.o.   MRN: [id number]  HPI 75 year old male who  has a past medical history of Aortic stenosis, Carpal tunnel syndrome (09/24/2014), Coronary artery disease, COVID-19, Diabetes mellitus, type 2 ([place]), Erectile dysfunction, Essential hypertension, Hyperlipidemia, and Palpitations.  He presents to the office today for three month follow up regarding DM. He is currently maintained on Metformin 1000 mg BID, Glipizide 10 mg XR BID, and Lantus 32 units daily.  Does not follow a diabetic diet routinely.  Reports that he had 1 or 2 low blood sugars last month but none this month.  Reports readings when he checks his blood sugars in the upward 90s to 100s.  Lab Results  Component Value Date   HGBA1C 8.7 (H) 03/23/2020   Additionally, he reports that over the last 2 to 4 weeks, his urine has been cloudy, odorous, and darker in color.  Also reports dysuria.  Denies hematuria, frequency, urgency  Review of Systems See HPI   Past Medical History:  Diagnosis Date  . 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 Carpal tunnel syndrome 09/24/2014   Bilateral  . 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 COVID-19   . Diabetes mellitus, type 2 ([place])   . Erectile dysfunction   . Essential hypertension   . Hyperlipidemia   . Palpitations    Cardiac monitor 03/2019: normal sinus rhythm, no AF, rare NSVT, VT    Social History   Socioeconomic History  . Marital status: Widowed    Spouse name: Not on file  . Number of children: 5  . Years of education: 10   . Highest education level: 10th grade  Occupational History  . Occupation: custodian    Comment: PT  Tobacco Use  . Smoking status: Never Smoker  . Smokeless tobacco: Never Used  Vaping Use  . Vaping Use: Never used  Substance and Sexual Activity  . Alcohol use: Yes    Alcohol/week: 1.0 standard drink    Types: 1  Glasses of wine per week    Comment: 1 x month  . Drug use: No  . Sexual activity: Yes  Other Topics Concern  . Not on file  Social History Narrative   Widowed to [name] for 53 years; 2 daughters liver with him now   Works part time as Chiropodist   5 children; 1 son deceased   Never Smoked   Alcohol use- no   Caffeine use: occasional       Lost one child a few months ago    Lost his sister, then son and then nephew - all within 2 weeks apart   Son had a stroke at 11    Was planning to get married last month in Feb.    Social Determinants of Health   Financial Resource Strain: [place]   . Difficulty of Paying Living Expenses: Not hard at all  Food Insecurity: No Food Insecurity  . Worried About Charity fundraiser in the Last Year: Never true  . Ran Out of Food in the Last Year: Never true  Transportation Needs: No Transportation Needs  . Lack of Transportation (Medical): No  . Lack of Transportation (Non-Medical): No  Physical Activity: Insufficiently Active  . Days of Exercise per Week: 3 days  . Minutes of Exercise per Session: 20 min  Stress: No Stress Concern Present  . Feeling of Stress : Not at all  Social Connections: Moderately Isolated  . Frequency of Communication with Friends and Family: More than three times a week  . Frequency of Social Gatherings with Friends and Family: More than three times a week  . Attends Religious Services: More than 4 times per year  . Active Member of Clubs or Organizations: No  . Attends Archivist Meetings: Never  . Marital Status: Widowed  Intimate Partner Violence: Not At Risk  . Fear of Current or Ex-Partner: No  . Emotionally Abused: No  . Physically Abused: No  . Sexually Abused: No    Past Surgical History:  Procedure Laterality Date  . AORTIC VALVE REPLACEMENT N/A 10/29/2015   Procedure: AORTIC VALVE REPLACEMENT (AVR) WITH 23MM MAGNA EASE TISSUE VALVE.;  Surgeon: [name], MD;   Location: [place];  Service: Open Heart Surgery;  Laterality: N/A;  . CARDIAC CATHETERIZATION N/A 10/27/2015   Procedure: Left Heart Cath and Coronary Angiography;  Surgeon: [name], MD;  Location: [facility];  Service: Cardiovascular;  Laterality: N/A;  . CORONARY ARTERY BYPASS GRAFT N/A 10/29/2015   Procedure: CORONARY ARTERY BYPASS GRAFTING (CABG) x Four UTILIZING THE LEFT INTERNAL MAMMARY ARTERY AND ENDOSCOPICALLY HARVESTED RIGHT SAPEHENEOUS VEINS.;  Surgeon: [name], MD;  Location: [facility];  Service: Open Heart Surgery;  Laterality: N/A;  . CYST REMOVAL HAND Right   . ESOPHAGOGASTRODUODENOSCOPY N/A 12/02/2015   Procedure: ESOPHAGOGASTRODUODENOSCOPY (EGD);  Surgeon: [name], MD;  Location: [facility];  Service: Gastroenterology;  Laterality: N/A;  . REPLACEMENT ASCENDING AORTA N/A 10/29/2015   Procedure: REPLACEMENT OF ASCENDING AORTA USING 34MM X 30CM WOVEN DOUBLE VELOUR VASCULAR GRAFT.;  Surgeon: [name], MD;  Location: [facility];  Service: Open Heart Surgery;  Laterality: N/A;  . TEE WITHOUT CARDIOVERSION N/A 10/29/2015   Procedure: TRANSESOPHAGEAL ECHOCARDIOGRAM (TEE);  Surgeon: [name], MD;  Location: [place];  Service: Open Heart Surgery;  Laterality: N/A;  . UVULECTOMY N/A 12/10/2015   Procedure: UVULECTOMY;  Surgeon: [name], MD;  Location: [facility];  Service: ENT;  Laterality: N/A;    Family History  Problem Relation Age of Onset  . Cancer Father        Throat cancer  . Diabetes Sister     Allergies  Allergen Reactions  . Amiodarone Nausea And Vomiting  . Quinolones     Patient was warned about not using Cipro and similar antibiotics. Recent studies have raised concern that fluoroquinolone antibiotics could be associated with an increased risk of aortic aneurysm Fluoroquinolones have non-antimicrobial properties that might jeopardise the integrity of the extracellular matrix of the vascular wall In a  propensity score  matched cohort study in [place], there was a 66% increased rate of aortic aneurysm or dissection associated with oral fluoroquinolone use, compared wit    Current Outpatient Medications on File Prior to Visit  Medication Sig Dispense Refill  . albuterol (VENTOLIN HFA) 108 (90 Base) MCG/ACT inhaler Inhale 1-2 puffs into the lungs 2 (two) times daily as needed for wheezing or shortness of breath. 8 g 0  . aspirin EC 81 MG tablet Take 81 mg by mouth daily.    . Blood Glucose Monitoring Suppl (ACCU-CHEK GUIDE ME) w/Device KIT 1 Stick by Does not apply route 3 (three) times daily. Used to check blood sugar 3 times daily (Patient taking differently: 1 Stick by Does not apply route in the morning  and at bedtime. Used to check blood sugar 2 times daily) 1 kit 0  . glipiZIDE (GLUCOTROL XL) 10 MG 24 hr tablet TAKE 1 TABLET (10 MG TOTAL) BY MOUTH IN THE MORNING AND AT BEDTIME. 180 tablet 0  . glucose blood (ACCU-CHEK GUIDE) test strip Use as instructed 100 each 1  . hydrochlorothiazide (HYDRODIURIL) 25 MG tablet Take 1 tablet (25 mg total) by mouth daily. 90 tablet 3  . Insulin Pen Needle (B-D ULTRAFINE III SHORT PEN) 31G X 8 MM MISC Use QHS 90 each 3  . metoprolol tartrate (LOPRESSOR) 25 MG tablet TAKE 1 TABLET BY MOUTH TWICE A DAY (Patient taking differently: Take 25 mg by mouth 2 (two) times daily.) 180 tablet 1  . rosuvastatin (CRESTOR) 40 MG tablet Take 1 tablet (40 mg total) by mouth at bedtime. 90 tablet 1   No current facility-administered medications on file prior to visit.    BP 130/78   Pulse 71   Temp 98.2 F (36.8 C) (Oral)   Ht 6' (1.829 m)   Wt 184 lb (83.5 kg)   SpO2 99%   BMI 24.95 kg/m       Objective:   Physical Exam Vitals and nursing note reviewed.  Constitutional:      Appearance: Normal appearance.  Abdominal:     General: Abdomen is flat.     Palpations: Abdomen is soft.     Tenderness: There is no right CVA tenderness or left CVA tenderness.  Skin:    General:  Skin is warm and dry.     Capillary Refill: Capillary refill takes less than 2 seconds.  Neurological:     General: No focal deficit present.     Mental Status: He is alert and oriented to person, place, and time.  Psychiatric:        Mood and Affect: Mood normal.        Behavior: Behavior normal.        Thought Content: Thought content normal.        Judgment: Judgment normal.       Assessment & Plan:  1. Uncontrolled type 2 diabetes mellitus with diabetic neuropathy, with long-term current use of insulin (HCC)  - POC HgB A1c- 8.5 - has improved but not at goal  - Increase lantus to 36 units daily - Work on lifestyle modifications - Follow up in three months  - metFORMIN (GLUCOPHAGE) 1000 MG tablet; Take 1 tablet (1,000 mg total) by mouth 2 (two) times daily with a meal.  Dispense: 180 tablet; Refill: 0  2. Frequent urination  - POC Urinalysis Dipstick + leuks and nits.  Looking at his last urine culture in February, the only oral antibiotic he was sensitive to was Cipro. - Urine Culture - ciprofloxacin (CIPRO) 500 MG tablet; Take 1 tablet (500 mg total) by mouth 2 (two) times daily for 7 days.  Dispense: 14 tablet; Refill: 0   [name], NP

## 2020-07-08 NOTE — Patient Instructions (Signed)
It was great seeing you today   Your A1c was 8.5 - I am going to have you increase the insulin to 36 units daily.   Please follow up in three months

## 2020-07-10 ENCOUNTER — Encounter: Payer: Self-pay | Admitting: Adult Health

## 2020-07-10 LAB — URINE CULTURE
MICRO NUMBER:: 11938776
SPECIMEN QUALITY:: ADEQUATE

## 2020-07-13 ENCOUNTER — Emergency Department (HOSPITAL_BASED_OUTPATIENT_CLINIC_OR_DEPARTMENT_OTHER)
Admission: EM | Admit: 2020-07-13 | Discharge: 2020-07-13 | Disposition: A | Discharge status: Home / Self Care | Payer: Medicare PPO | Source: Home / Self Care | Attending: Emergency Medicine | Admitting: Emergency Medicine

## 2020-07-13 ENCOUNTER — Emergency Department (HOSPITAL_COMMUNITY)
Admission: EM | Admit: 2020-07-13 | Discharge: 2020-07-13 | Disposition: A | Discharge status: Home / Self Care | Payer: Medicare PPO | Attending: Medical | Admitting: Medical

## 2020-07-13 ENCOUNTER — Encounter (HOSPITAL_COMMUNITY): Payer: Self-pay

## 2020-07-13 ENCOUNTER — Other Ambulatory Visit: Payer: Self-pay

## 2020-07-13 ENCOUNTER — Encounter (HOSPITAL_BASED_OUTPATIENT_CLINIC_OR_DEPARTMENT_OTHER): Payer: Self-pay | Admitting: Emergency Medicine

## 2020-07-13 DIAGNOSIS — Z7982 Long term (current) use of aspirin: Secondary | ICD-10-CM | POA: Insufficient documentation

## 2020-07-13 DIAGNOSIS — I2510 Atherosclerotic heart disease of native coronary artery without angina pectoris: Secondary | ICD-10-CM | POA: Insufficient documentation

## 2020-07-13 DIAGNOSIS — E119 Type 2 diabetes mellitus without complications: Secondary | ICD-10-CM | POA: Insufficient documentation

## 2020-07-13 DIAGNOSIS — Z8616 Personal history of COVID-19: Secondary | ICD-10-CM | POA: Insufficient documentation

## 2020-07-13 DIAGNOSIS — Z951 Presence of aortocoronary bypass graft: Secondary | ICD-10-CM | POA: Insufficient documentation

## 2020-07-13 DIAGNOSIS — Z7984 Long term (current) use of oral hypoglycemic drugs: Secondary | ICD-10-CM | POA: Insufficient documentation

## 2020-07-13 DIAGNOSIS — Z5321 Procedure and treatment not carried out due to patient leaving prior to being seen by health care provider: Secondary | ICD-10-CM | POA: Insufficient documentation

## 2020-07-13 DIAGNOSIS — Z79899 Other long term (current) drug therapy: Secondary | ICD-10-CM | POA: Insufficient documentation

## 2020-07-13 DIAGNOSIS — N390 Urinary tract infection, site not specified: Secondary | ICD-10-CM | POA: Diagnosis not present

## 2020-07-13 DIAGNOSIS — Z794 Long term (current) use of insulin: Secondary | ICD-10-CM | POA: Insufficient documentation

## 2020-07-13 DIAGNOSIS — E860 Dehydration: Secondary | ICD-10-CM | POA: Insufficient documentation

## 2020-07-13 DIAGNOSIS — R112 Nausea with vomiting, unspecified: Secondary | ICD-10-CM | POA: Diagnosis not present

## 2020-07-13 DIAGNOSIS — R1111 Vomiting without nausea: Secondary | ICD-10-CM

## 2020-07-13 DIAGNOSIS — R300 Dysuria: Secondary | ICD-10-CM | POA: Diagnosis not present

## 2020-07-13 DIAGNOSIS — I119 Hypertensive heart disease without heart failure: Secondary | ICD-10-CM | POA: Insufficient documentation

## 2020-07-13 LAB — URINALYSIS, ROUTINE W REFLEX MICROSCOPIC
Bilirubin Urine: NEGATIVE
Glucose, UA: NEGATIVE mg/dL
Hgb urine dipstick: NEGATIVE
Ketones, ur: NEGATIVE mg/dL
Leukocytes,Ua: NEGATIVE
Nitrite: NEGATIVE
Specific Gravity, Urine: 1.02 (ref 1.005–1.030)
pH: 5.5 (ref 5.0–8.0)

## 2020-07-13 LAB — CBC WITH DIFFERENTIAL/PLATELET
Abs Immature Granulocytes: 0.03 10*3/uL (ref 0.00–0.07)
Basophils Absolute: 0.1 10*3/uL (ref 0.0–0.1)
Basophils Relative: 1 %
Eosinophils Absolute: 0 10*3/uL (ref 0.0–0.5)
Eosinophils Relative: 0 %
HCT: 40.3 % (ref 39.0–52.0)
Hemoglobin: 13.4 g/dL (ref 13.0–17.0)
Immature Granulocytes: 0 %
Lymphocytes Relative: 29 %
Lymphs Abs: 3.3 10*3/uL (ref 0.7–4.0)
MCH: 30.5 pg (ref 26.0–34.0)
MCHC: 33.3 g/dL (ref 30.0–36.0)
MCV: 91.6 fL (ref 80.0–100.0)
Monocytes Absolute: 0.9 10*3/uL (ref 0.1–1.0)
Monocytes Relative: 8 %
Neutro Abs: 7 10*3/uL (ref 1.7–7.7)
Neutrophils Relative %: 62 %
Platelets: 277 10*3/uL (ref 150–400)
RBC: 4.4 MIL/uL (ref 4.22–5.81)
RDW: 13.2 % (ref 11.5–15.5)
WBC: 11.3 10*3/uL — ABNORMAL HIGH (ref 4.0–10.5)
nRBC: 0 % (ref 0.0–0.2)

## 2020-07-13 LAB — BASIC METABOLIC PANEL
Anion gap: 10 (ref 5–15)
BUN: 24 mg/dL — ABNORMAL HIGH (ref 8–23)
CO2: 28 mmol/L (ref 22–32)
Calcium: 9.5 mg/dL (ref 8.9–10.3)
Chloride: 98 mmol/L (ref 98–111)
Creatinine, Ser: 1.34 mg/dL — ABNORMAL HIGH (ref 0.61–1.24)
GFR, Estimated: 56 mL/min — ABNORMAL LOW (ref >60)
Glucose, Bld: 172 mg/dL — ABNORMAL HIGH (ref 70–99)
Potassium: 3.7 mmol/L (ref 3.5–5.1)
Sodium: 136 mmol/L (ref 135–145)

## 2020-07-13 MED ORDER — ONDANSETRON HCL 4 MG/2ML IJ SOLN
4.0000 mg | Freq: Once | INTRAMUSCULAR | Status: AC
  Administered 2020-07-13: 4 mg via INTRAVENOUS
  Filled 2020-07-13: qty 2

## 2020-07-13 MED ORDER — FOSFOMYCIN TROMETHAMINE 3 G PO PACK
3.0000 g | PACK | Freq: Once | ORAL | Status: AC
  Administered 2020-07-13: 3 g via ORAL
  Filled 2020-07-13: qty 3

## 2020-07-13 MED ORDER — ONDANSETRON HCL 4 MG PO TABS: 4.0000 mg | ORAL_TABLET | Freq: Four times a day (QID) | ORAL | 0 refills | Status: DC

## 2020-07-13 MED ORDER — SODIUM CHLORIDE 0.9 % IV BOLUS
1000.0000 mL | Freq: Once | INTRAVENOUS | Status: AC
  Administered 2020-07-13: 1000 mL via INTRAVENOUS

## 2020-07-13 NOTE — ED Provider Notes (Signed)
[facility] EMERGENCY DEPT Provider Note   CSN: [id number] Arrival date & time: 07/13/20  1614     History Chief Complaint  Patient presents with  . Emesis    [name] is a 75 y.o. male.  On antibiotics for uti (cipro). Having n/v anf concern for dehyrdation.   The history is provided by the patient.  Emesis Severity:  Mild Timing:  Intermittent Able to tolerate:  Liquids Progression:  Unchanged Chronicity:  New Recent urination:  Normal Relieved by:  Nothing Worsened by:  Nothing Associated symptoms: no abdominal pain, no arthralgias, no chills, no cough, no fever and no sore throat        Past Medical History:  Diagnosis Date  . 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 Carpal tunnel syndrome 09/24/2014   Bilateral  . 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 COVID-19   . Diabetes mellitus, type 2 ([place])   . Erectile dysfunction   . Essential hypertension   . Hyperlipidemia   . Palpitations    Cardiac monitor 03/2019: normal sinus rhythm, no AF, rare NSVT, VT    Patient Active Problem List   Diagnosis Date Noted  . Thoracic aortic aneurysm without rupture ([place]) 05/07/2020  . Sepsis secondary to UTI ([place]) 03/24/2020  . Sepsis ([place]) 03/23/2020  . Pneumonia due to COVID-19 virus 02/15/2019  . S/P AVR (aortic valve replacement) 11/14/2016  . 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 Uvular swelling 12/06/2015  . Tibial plateau fracture, left, closed, initial encounter 12/02/2015  . Coronary artery disease involving coronary bypass graft of native heart without angina pectoris   . PAF (paroxysmal atrial fibrillation) ([place]) 11/24/2015  . Orthostatic hypotension   . Fungus present in urine   . Hx of CABG (Sept 2017) 11/13/2015  . Atrial fibrillation (post op after CABG Sept 2017) 11/13/2015  . Orthostatic syncope 11/13/2015  . Elevated troponin 11/13/2015  .  Coronary artery disease involving native coronary artery of native heart without angina pectoris   . Aortic stenosis s/p tissue valve (Sept 2017) 10/28/2015  . Atypical angina ([place]) 10/27/2015  . Abnormal nuclear stress test 10/27/2015  . Coronary artery disease involving native heart with angina pectoris ([place]) 10/27/2015  . Aortic aneurysm ([place]) 07/23/2015  . Carpal tunnel syndrome 09/24/2014  . Left carotid bruit 05/07/2013  . Diabetic retinopathy ([place]) 09/23/2012  . Diminished pulses in lower extremity 06/17/2012  . Chest pain 03/22/2011  . Paresthesia of foot 11/04/2010  . Aortic valve disease 04/06/2010  . HYPERTROPHY PROSTATE W/UR OBST & OTH LUTS 03/15/2009  . Hyperlipidemia 12/13/2007  . Type II diabetes mellitus with neurological manifestations, uncontrolled ([place]) 11/29/2007  . ERECTILE DYSFUNCTION 11/29/2007  . Essential hypertension 11/29/2007    Past Surgical History:  Procedure Laterality Date  . AORTIC VALVE REPLACEMENT N/A 10/29/2015   Procedure: AORTIC VALVE REPLACEMENT (AVR) WITH 23MM MAGNA EASE TISSUE VALVE.;  Surgeon: [name], MD;  Location: [place];  Service: Open Heart Surgery;  Laterality: N/A;  . CARDIAC CATHETERIZATION N/A 10/27/2015   Procedure: Left Heart Cath and Coronary Angiography;  Surgeon: [name], MD;  Location: [facility];  Service: Cardiovascular;  Laterality: N/A;  . CORONARY ARTERY BYPASS GRAFT N/A 10/29/2015   Procedure: CORONARY ARTERY BYPASS GRAFTING (CABG) x Four UTILIZING THE LEFT INTERNAL MAMMARY ARTERY AND ENDOSCOPICALLY HARVESTED RIGHT SAPEHENEOUS VEINS.;  Surgeon: [name], MD;  Location:  [facility];  Service: Open Heart Surgery;  Laterality: N/A;  . CYST REMOVAL HAND Right   . ESOPHAGOGASTRODUODENOSCOPY N/A 12/02/2015   Procedure: ESOPHAGOGASTRODUODENOSCOPY (EGD);  Surgeon: [name], MD;  Location: [place];  Service: Gastroenterology;  Laterality: N/A;  . REPLACEMENT ASCENDING AORTA N/A 10/29/2015    Procedure: REPLACEMENT OF ASCENDING AORTA USING 34MM X 30CM WOVEN DOUBLE VELOUR VASCULAR GRAFT.;  Surgeon: [name], MD;  Location: [facility];  Service: Open Heart Surgery;  Laterality: N/A;  . TEE WITHOUT CARDIOVERSION N/A 10/29/2015   Procedure: TRANSESOPHAGEAL ECHOCARDIOGRAM (TEE);  Surgeon: [name], MD;  Location: [place];  Service: Open Heart Surgery;  Laterality: N/A;  . UVULECTOMY N/A 12/10/2015   Procedure: UVULECTOMY;  Surgeon: [name], MD;  [facility];  Service: ENT;  Laterality: N/A;       Family History  Problem Relation Age of Onset  . Cancer Father        Throat cancer  . Diabetes Sister     Social History   Tobacco Use  . Smoking status: Never Smoker  . Smokeless tobacco: Never Used  Vaping Use  . Vaping Use: Never used  Substance Use Topics  . Alcohol use: Yes    Alcohol/week: 1.0 standard drink    Types: 1 Glasses of wine per week    Comment: 1 x month  . Drug use: No    Home Medications Prior to Admission medications   Medication Sig Start Date End Date Taking? Authorizing Provider  ondansetron (ZOFRAN) 4 MG tablet Take 1 tablet (4 mg total) by mouth every 6 (six) hours. 07/13/20  Yes Affan Callow, DO  albuterol (VENTOLIN HFA) 108 (90 Base) MCG/ACT inhaler Inhale 1-2 puffs into the lungs 2 (two) times daily as needed for wheezing or shortness of breath. 02/23/19   Kayleen Memos, DO  aspirin EC 81 MG tablet Take 81 mg by mouth daily.    [provider]  Blood Glucose Monitoring Suppl (ACCU-CHEK GUIDE ME) w/Device KIT 1 Stick by Does not apply route 3 (three) times daily. Used to check blood sugar 3 times daily Patient taking differently: 1 Stick by Does not apply route in the morning and at bedtime. Used to check blood sugar 2 times daily 03/06/19   Nafziger, Tommi Rumps, NP  ciprofloxacin (CIPRO) 500 MG tablet Take 1 tablet (500 mg total) by mouth 2 (two) times daily for 7 days. 07/08/20 07/15/20  Nafziger, Tommi Rumps, NP  glipiZIDE  (GLUCOTROL XL) 10 MG 24 hr tablet TAKE 1 TABLET (10 MG TOTAL) BY MOUTH IN THE MORNING AND AT BEDTIME. 06/24/20   Nafziger, Tommi Rumps, NP  glucose blood (ACCU-CHEK GUIDE) test strip Use as instructed 03/05/19   Nafziger, Tommi Rumps, NP  hydrochlorothiazide (HYDRODIURIL) 25 MG tablet Take 1 tablet (25 mg total) by mouth daily. 04/02/20   Nafziger, Tommi Rumps, NP  insulin glargine (LANTUS SOLOSTAR) 100 UNIT/ML Solostar Pen Inject 36 Units into the skin daily. 07/08/20 10/06/20  Nafziger, Tommi Rumps, NP  Insulin Pen Needle (B-D ULTRAFINE III SHORT PEN) 31G X 8 MM MISC Use QHS 07/30/19   Nafziger, Tommi Rumps, NP  metFORMIN (GLUCOPHAGE) 1000 MG tablet Take 1 tablet (1,000 mg total) by mouth 2 (two) times daily with a meal. 07/08/20   Nafziger, Tommi Rumps, NP  metoprolol tartrate (LOPRESSOR) 25 MG tablet TAKE 1 TABLET BY MOUTH TWICE A DAY Patient taking differently: Take 25 mg by mouth 2 (two) times daily. 03/01/20   [name], [name], MD  rosuvastatin (CRESTOR) 40 MG tablet  Take 1 tablet (40 mg total) by mouth at bedtime. 04/02/20   Nafziger, Tommi Rumps, NP    Allergies    Amiodarone and Quinolones  Review of Systems   Review of Systems  Constitutional: Negative for chills and fever.  HENT: Negative for ear pain and sore throat.   Eyes: Negative for pain and visual disturbance.  Respiratory: Negative for cough and shortness of breath.   Cardiovascular: Negative for chest pain and palpitations.  Gastrointestinal: Positive for nausea and vomiting. Negative for abdominal pain.  Genitourinary: Positive for dysuria. Negative for hematuria.  Musculoskeletal: Negative for arthralgias and back pain.  Skin: Negative for color change and rash.  Neurological: Negative for seizures and syncope.  All other systems reviewed and are negative.   Physical Exam Updated Vital Signs BP (!) 151/71   Pulse 61   Temp 98.3 F (36.8 C) (Oral)   Resp 18   Ht 6' (1.829 m)   Wt 83.5 kg   SpO2 100%   BMI 24.95 kg/m   Physical Exam Vitals and nursing note  reviewed.  Constitutional:      General: He is not in acute distress.    Appearance: He is well-developed. He is not ill-appearing.  HENT:     Head: Normocephalic and atraumatic.     Mouth/Throat:     Mouth: Mucous membranes are moist.  Eyes:     Extraocular Movements: Extraocular movements intact.     Conjunctiva/sclera: Conjunctivae normal.     Pupils: Pupils are equal, round, and reactive to light.  Cardiovascular:     Rate and Rhythm: Normal rate and regular rhythm.     Pulses: Normal pulses.     Heart sounds: Normal heart sounds. No murmur heard.   Pulmonary:     Effort: Pulmonary effort is normal. No respiratory distress.     Breath sounds: Normal breath sounds.  Abdominal:     Palpations: Abdomen is soft.     Tenderness: There is no abdominal tenderness.  Musculoskeletal:     Cervical back: Neck supple.  Skin:    General: Skin is warm and dry.     Capillary Refill: Capillary refill takes less than 2 seconds.  Neurological:     General: No focal deficit present.     Mental Status: He is alert.     ED Results / Procedures / Treatments   Labs (all labs ordered are listed, but only abnormal results are displayed) Labs Reviewed  URINALYSIS, ROUTINE W REFLEX MICROSCOPIC - Abnormal; Notable for the following components:      Result Value   Protein, ur TRACE (*)    All other components within normal limits  URINE CULTURE    EKG None  Radiology No results found.  Procedures Procedures   Medications Ordered in ED Medications  sodium chloride 0.9 % bolus 1,000 mL (0 mLs Intravenous Stopped 07/13/20 1828)  ondansetron (ZOFRAN) injection 4 mg (4 mg Intravenous Given 07/13/20 1725)  fosfomycin (MONUROL) packet 3 g (3 g Oral Given 07/13/20 1827)    ED Course  I have reviewed the triage vital signs and the nursing notes.  Pertinent labs & imaging results that were available during my care of the patient were reviewed by me and considered in my medical decision  making (see chart for details).    MDM Rules/Calculators/A&P                          [name] is a  75 year old male with history of high cholesterol, diabetes, CAD who presents the ED with emesis.  Denies any abdominal pain.  Throughout twice today.  Has been on antibiotics for UTI.  Urinalysis today shows no infection.  No significant white count or electrolyte abnormality or kidney injury.  His urine culture grew out Klebsiella but does have some sensitivity to Cipro.  We will add a dose of fosfomycin after talking with pharmacy.  He feels much better after IV fluids and Zofran.  No concern for pyelonephritis or sepsis.  Given prescription for Zofran and discharged in ED in good condition.  This chart was dictated using voice recognition software.  Despite best efforts to proofread,  errors can occur which can change the documentation meaning.    Final Clinical Impression(s) / ED Diagnoses Final diagnoses:  Vomiting without nausea, intractability of vomiting not specified, unspecified vomiting type    Rx / DC Orders ED Discharge Orders         Ordered    ondansetron (ZOFRAN) 4 MG tablet  Every 6 hours        07/13/20 1900           Lennice Sites, DO 07/13/20 1901

## 2020-07-13 NOTE — ED Triage Notes (Signed)
Patient states that he was sent by his MD for UTI. Had urine sample on Friday due to strong odor and frequency. Mild dysuria. States that he cannot take po meds because they make him sick

## 2020-07-13 NOTE — ED Triage Notes (Signed)
Pt states was being tx for UTI and meds make him vomit

## 2020-07-13 NOTE — ED Provider Notes (Signed)
Emergency Medicine Provider Triage Evaluation Note  [name] , a 75 y.o. male  was evaluated in triage.  Pt complains of burning with urination for "awhile" Diagnosed with a UTI on Friday by PCP. Has been taking an abx however pt unsure of the name. States it is making him sick and causing him to vomit and his PCP sent him here for IV abx.  Review of Systems  Positive: + dysuria, nausea, vomiting Negative: - fevers, chills, abdominal pain, flank pain  Physical Exam  BP 133/77 (BP Location: Right Arm)   Pulse 80   Temp 98.2 F (36.8 C) (Oral)   Resp 16   SpO2 100%  Gen:   Awake, no distress   Resp:  Normal effort  MSK:   Moves extremities without difficulty  Other:    Medical Decision Making  Medically screening exam initiated at 1:12 PM.  Appropriate orders placed.  [name] was informed that the remainder of the evaluation will be completed by another provider, this initial triage assessment does not replace that evaluation, and the importance of remaining in the ED until their evaluation is complete.  Labs and U/A ordered. Stable for waiting room.    Eustaquio Maize, PA-C 07/13/20 1313    Isla Pence, MD 07/13/20 [phone]

## 2020-07-13 NOTE — Telephone Encounter (Signed)
Pt notified to go to ED for IV antibiotics

## 2020-07-15 LAB — URINE CULTURE: Culture: NO GROWTH

## 2020-08-03 DIAGNOSIS — E113312 Type 2 diabetes mellitus with moderate nonproliferative diabetic retinopathy with macular edema, left eye: Secondary | ICD-10-CM | POA: Diagnosis not present

## 2020-08-18 DIAGNOSIS — E113311 Type 2 diabetes mellitus with moderate nonproliferative diabetic retinopathy with macular edema, right eye: Secondary | ICD-10-CM | POA: Diagnosis not present

## 2020-09-13 ENCOUNTER — Other Ambulatory Visit: Payer: Self-pay

## 2020-09-13 ENCOUNTER — Ambulatory Visit: Payer: Medicare PPO | Admitting: Physician Assistant

## 2020-09-13 ENCOUNTER — Encounter: Payer: Self-pay | Admitting: Physician Assistant

## 2020-09-13 VITALS — BP 140/70 | HR 75 | Ht 72.0 in | Wt 186.8 lb

## 2020-09-13 DIAGNOSIS — I712 Thoracic aortic aneurysm, without rupture, unspecified: Secondary | ICD-10-CM

## 2020-09-13 DIAGNOSIS — I10 Essential (primary) hypertension: Secondary | ICD-10-CM

## 2020-09-13 DIAGNOSIS — E782 Mixed hyperlipidemia: Secondary | ICD-10-CM

## 2020-09-13 DIAGNOSIS — I2510 Atherosclerotic heart disease of native coronary artery without angina pectoris: Secondary | ICD-10-CM | POA: Diagnosis not present

## 2020-09-13 DIAGNOSIS — I350 Nonrheumatic aortic (valve) stenosis: Secondary | ICD-10-CM | POA: Diagnosis not present

## 2020-09-13 DIAGNOSIS — Z952 Presence of prosthetic heart valve: Secondary | ICD-10-CM

## 2020-09-13 DIAGNOSIS — Z95828 Presence of other vascular implants and grafts: Secondary | ICD-10-CM

## 2020-09-13 NOTE — Progress Notes (Addendum)
Cardiology Office Note:    Date:  09/13/2020   ID:  [name], DOB 1945/07/09, MRN [id number]  PCP:  Brandon Peng, NP   [facility] Providers Cardiologist:  Brandon Moores, MD Cardiology APP:  [name]      Referring MD: Brandon Peng, NP   Chief Complaint:  Follow-up (CAD, AS, aortic aneurysm s/p AVR, CABG, aneurysm repair)    Patient Profile:    [name] is a 75 y.o. male with:  Coronary artery disease Aortic stenosis Ascending aortic aneurysm S/p CABG + bioprosthetic AVR + ascending aorta replacement in 2017 C/b post op AFib; intractable nausea and vomiting (readmitted several times >> Amio DC'd) Diabetes mellitus Hypertension Hyperlipidemia Hx of COVID-19 c/b pneumonia in 02/2019  Prior CV studies: LONG TERM MONITOR (3-7 DAYS) INTERPRETATION 04/01/2019 Narrative  Sinus rhythm including sinus bradycardia and sinus tachycardia  Rare runs of nonsustained VT  Rare runs of nonsustained SVT.  Rare PVCs  Echocardiogram 02/18/2019 EF 50-55, mod LVH, Gr 1 DD, normally functioning AVR, mean AoV gradient 6   CT Chest and Aorta 11/07/2018 Proximal aortic arch 4.2 cm  Aortic atherosclerosis    Cardiac catheterization 10/27/15 Multivessel coronary disease including severe mid LAD and mid RCA lesions as well as diffuse moderate to severe lesions in circumflex and OM 1 lesions and a codominant circumflex. This is in the setting of moderate aortic stenosis and ascending aortic aneurysmal dilation of 4.7 cm.    Pre CABG Dopplers 10/28/15 Findings suggest 1-39% internal carotid artery stenosis bilaterally.   History of Present Illness: [name] was last seen by Dr. Acie Mathis in 2/22.  He returns for follow-up.  He is here today with his daughter.  He continues to have occasional episodes of chest discomfort on the left.  This seems to occur sometime after activity.  He does not have exertional symptoms.  His symptoms have been ongoing for over a year  without change.  He has not had significant shortness of breath.  He has not had orthopnea, syncope, leg edema.        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   Carpal tunnel syndrome 09/24/2014   Bilateral   Coronary artery disease    Multivessel status post CABG September 2017   COVID-19    Diabetes mellitus, type 2 ([place])    Erectile dysfunction    Essential hypertension    Hyperlipidemia    Palpitations    Cardiac monitor 03/2019: normal sinus rhythm, no AF, rare NSVT, VT    Current Medications: Current Meds  Medication Sig   albuterol (VENTOLIN HFA) 108 (90 Base) MCG/ACT inhaler Inhale 1-2 puffs into the lungs 2 (two) times daily as needed for wheezing or shortness of breath.   aspirin EC 81 MG tablet Take 81 mg by mouth daily.   Blood Glucose Monitoring Suppl (ACCU-CHEK GUIDE ME) w/Device KIT 1 Stick by Does not apply route 3 (three) times daily. Used to check blood sugar 3 times daily   glipiZIDE (GLUCOTROL XL) 10 MG 24 hr tablet TAKE 1 TABLET (10 MG TOTAL) BY MOUTH IN THE MORNING AND AT BEDTIME.   glucose blood (ACCU-CHEK GUIDE) test strip Use as instructed   hydrochlorothiazide (HYDRODIURIL) 25 MG tablet Take 1 tablet (25 mg total) by mouth daily.   insulin glargine (LANTUS SOLOSTAR) 100 UNIT/ML Solostar Pen Inject 36 Units into the skin daily.   Insulin Pen Needle (B-D  ULTRAFINE III SHORT PEN) 31G X 8 MM MISC Use QHS   metFORMIN (GLUCOPHAGE) 1000 MG tablet Take 1 tablet (1,000 mg total) by mouth 2 (two) times daily with a meal.   metoprolol tartrate (LOPRESSOR) 25 MG tablet TAKE 1 TABLET BY MOUTH TWICE A DAY   ondansetron (ZOFRAN) 4 MG tablet Take 1 tablet (4 mg total) by mouth every 6 (six) hours.   rosuvastatin (CRESTOR) 40 MG tablet Take 1 tablet (40 mg total) by mouth at bedtime.     Allergies:   Amiodarone and Quinolones   Social History   Tobacco Use   Smoking status: Never   Smokeless tobacco: Never   Vaping Use   Vaping Use: Never used  Substance Use Topics   Alcohol use: Yes    Alcohol/week: 1.0 standard drink    Types: 1 Glasses of wine per week    Comment: 1 x month   Drug use: No     Family Hx: The patient's family history includes Cancer in his father; Diabetes in his sister.  Review of Systems  Cardiovascular:  Negative for claudication.    EKGs/Labs/Other Test Reviewed:    EKG:  EKG is not ordered today.  The ekg ordered today demonstrates N/A  Recent Labs: 11/06/2019: TSH 1.58 03/24/2020: ALT 27 07/13/2020: BUN 24; Creatinine, Ser 1.34; Hemoglobin 13.4; Platelets 277; Potassium 3.7; Sodium 136   Recent Lipid Panel Lab Results  Component Value Date/Time   CHOL 226 (H) 11/06/2019 02:40 PM   CHOL 193 11/08/2018 03:29 PM   TRIG 94 11/06/2019 02:40 PM   HDL 96 11/06/2019 02:40 PM   HDL 98 11/08/2018 03:29 PM   LDLCALC 110 (H) 11/06/2019 02:40 PM   LDLDIRECT 112.6 05/14/2012 10:31 AM      Risk Assessment/Calculations:      Physical Exam:    VS:  BP 140/70   Pulse 75   Ht 6' (1.829 m)   Wt 186 lb 12.8 oz (84.7 kg)   SpO2 97%   BMI 25.33 kg/m     Wt Readings from Last 3 Encounters:  09/13/20 186 lb 12.8 oz (84.7 kg)  07/13/20 184 lb (83.5 kg)  07/08/20 184 lb (83.5 kg)     Constitutional:      Appearance: Healthy appearance. Not in distress.  Neck:     Vascular: JVD normal.  Pulmonary:     Effort: Pulmonary effort is normal.     Breath sounds: No wheezing. No rales.  Cardiovascular:     Normal rate. Regular rhythm. Normal S1. Normal S2.      Murmurs: There is a grade 2/6 systolic murmur at the ULSB.  Edema:    Peripheral edema absent.  Abdominal:     Palpations: Abdomen is soft.  Skin:    General: Skin is warm and dry.  Neurological:     General: No focal deficit present.     Mental Status: Alert and oriented to person, place and time.     Cranial Nerves: Cranial nerves are intact.        ASSESSMENT & PLAN:    1. Coronary artery  disease involving native coronary artery of native heart without angina pectoris S/p CABG in 2017.  He has had some chronic non-cardiac chest discomfort for over a year now.  This is not changing or related to exertion.  No further testing needed at this time.  Continue aspirin, metoprolol, rosuvastatin.  Follow-up in 6 months.  2. Nonrheumatic aortic valve stenosis 3. S/P AVR (aortic  valve replacement) Normally functioning AVR by echocardiogram in 1/21.  Continue SBE prophylaxis.  4. Thoracic aortic aneurysm without rupture ([place]) 5. Hx of ascending aorta replacement Followed by cardiothoracic surgery.    6. Essential (primary) hypertension Blood pressure somewhat above target.  He does note better blood pressure readings at home.  I have asked him to monitor blood pressures over the next couple of weeks and send those readings to me for review.  If his blood pressure is above target, I will add amlodipine 2.5 mg daily.  7. Mixed hyperlipidemia Recent LDL above 70.  Primary care has increased his rosuvastatin to 40 mg daily and plans follow-up in the next 6 months.  Dispo:  Return in about 6 months (around 03/16/2021) for Routine Follow Up, w/ Dr. Acie Mathis.   Medication Adjustments/Labs and Tests Ordered: Current medicines are reviewed at length with the patient today.  Concerns regarding medicines are outlined above.  Tests Ordered: No orders of the defined types were placed in this encounter.  Medication Changes: No orders of the defined types were placed in this encounter.   Signed, Richardson Dopp, PA-C  09/13/2020 4:52 PM    [facility] [address], [place], [place]  [id number] Phone: [phone]; Fax: [phone]

## 2020-09-13 NOTE — Patient Instructions (Signed)
Medication Instructions:   Your physician recommends that you continue on your current medications as directed. Please refer to the Current Medication list given to you today.  *If you need a refill on your cardiac medications before your next appointment, please call your pharmacy*   Lab Work:  -NONE  If you have labs (blood work) drawn today and your tests are completely normal, you will receive your results only by: [place] (if you have MyChart) OR A paper copy in the mail If you have any lab test that is abnormal or we need to change your treatment, we will call you to review the results.   Testing/Procedures:  -NONE  Follow-Up: At [facility], you and your health needs are our priority.  As part of our continuing mission to provide you with exceptional heart care, we have created designated Provider Care Teams.  These Care Teams include your primary Cardiologist (physician) and Advanced Practice Providers (APPs -  Physician Assistants and Nurse Practitioners) who all work together to provide you with the care you need, when you need it.  We recommend signing up for the patient portal called "MyChart".  Sign up information is provided on this After Visit Summary.  MyChart is used to connect with patients for Virtual Visits (Telemedicine).  Patients are able to view lab/test results, encounter notes, upcoming appointments, etc.  Non-urgent messages can be sent to your provider as well.   To learn more about what you can do with MyChart, go to [email or web address].    Your next appointment:   6 month(s) with Dr. Acie Fredrickson on Friday, February 17 @ 3:00 pm.   The format for your next appointment:   In Person  Provider:   Mertie Moores, MD   Other Instructions  Please call office @ [phone] or send mychart message with blood pressure readings in 1-2 weeks.  Blood Pressure Record Sheet To take your blood pressure, you will need a blood pressure machine. You can  buy a blood pressure machine (blood pressure monitor) at your clinic, drug store, or online. When choosing one, consider: An automatic monitor that has an arm cuff. A cuff that wraps snugly around your upper arm. You should be able to fit only one finger between your arm and the cuff. A device that stores blood pressure reading results. Do not choose a monitor that measures your blood pressure from your wrist or finger. Follow your health care provider's instructions for how to take your blood pressure. To use this form: Get one reading in the morning (a.m.) before you take any medicines. Get one reading in the evening (p.m.) before supper. Take at least 2 readings with each blood pressure check. This makes sure the results are correct. Wait 1-2 minutes between measurements. Write down the results in the spaces on this form. Repeat this once a week, or as told by your health care provider. Make a follow-up appointment with your health care provider to discuss the results. Blood pressure log Date: _______________________ a.m. _____________________(1st reading) _____________________(2nd reading) p.m. _____________________(1st reading) _____________________(2nd reading) Date: _______________________ a.m. _____________________(1st reading) _____________________(2nd reading) p.m. _____________________(1st reading) _____________________(2nd reading) Date: _______________________ a.m. _____________________(1st reading) _____________________(2nd reading) p.m. _____________________(1st reading) _____________________(2nd reading) Date: _______________________ a.m. _____________________(1st reading) _____________________(2nd reading) p.m. _____________________(1st reading) _____________________(2nd reading) Date: _______________________ a.m. _____________________(1st reading) _____________________(2nd reading) p.m. _____________________(1st reading) _____________________(2nd reading) This  information is not intended to replace advice given to you by your health care provider. Make sure you discuss any questions you have with your healthcare  provider. Document Revised: 05/21/2019 Document Reviewed: 05/21/2019 Elsevier Patient Education  [place].

## 2020-09-14 DIAGNOSIS — E113312 Type 2 diabetes mellitus with moderate nonproliferative diabetic retinopathy with macular edema, left eye: Secondary | ICD-10-CM | POA: Diagnosis not present

## 2020-09-29 ENCOUNTER — Other Ambulatory Visit: Payer: Self-pay | Admitting: Adult Health

## 2020-09-29 DIAGNOSIS — E113311 Type 2 diabetes mellitus with moderate nonproliferative diabetic retinopathy with macular edema, right eye: Secondary | ICD-10-CM | POA: Diagnosis not present

## 2020-10-04 DIAGNOSIS — H25813 Combined forms of age-related cataract, bilateral: Secondary | ICD-10-CM | POA: Diagnosis not present

## 2020-10-04 DIAGNOSIS — E113313 Type 2 diabetes mellitus with moderate nonproliferative diabetic retinopathy with macular edema, bilateral: Secondary | ICD-10-CM | POA: Diagnosis not present

## 2020-10-04 DIAGNOSIS — H3582 Retinal ischemia: Secondary | ICD-10-CM | POA: Diagnosis not present

## 2020-10-04 DIAGNOSIS — H31093 Other chorioretinal scars, bilateral: Secondary | ICD-10-CM | POA: Diagnosis not present

## 2020-10-08 ENCOUNTER — Other Ambulatory Visit: Payer: Self-pay

## 2020-10-08 ENCOUNTER — Encounter: Payer: Self-pay | Admitting: Adult Health

## 2020-10-08 ENCOUNTER — Ambulatory Visit: Payer: Medicare PPO | Admitting: Adult Health

## 2020-10-08 VITALS — BP 120/62 | HR 80 | Temp 99.3°F | Ht 72.0 in | Wt 185.0 lb

## 2020-10-08 DIAGNOSIS — T63481A Toxic effect of venom of other arthropod, accidental (unintentional), initial encounter: Secondary | ICD-10-CM | POA: Diagnosis not present

## 2020-10-08 DIAGNOSIS — E1140 Type 2 diabetes mellitus with diabetic neuropathy, unspecified: Secondary | ICD-10-CM

## 2020-10-08 DIAGNOSIS — I10 Essential (primary) hypertension: Secondary | ICD-10-CM

## 2020-10-08 DIAGNOSIS — Z794 Long term (current) use of insulin: Secondary | ICD-10-CM | POA: Diagnosis not present

## 2020-10-08 DIAGNOSIS — IMO0002 Reserved for concepts with insufficient information to code with codable children: Secondary | ICD-10-CM

## 2020-10-08 DIAGNOSIS — E1165 Type 2 diabetes mellitus with hyperglycemia: Secondary | ICD-10-CM | POA: Diagnosis not present

## 2020-10-08 LAB — POCT GLYCOSYLATED HEMOGLOBIN (HGB A1C): Hemoglobin A1C: 8 % — AB (ref 4.0–5.6)

## 2020-10-08 MED ORDER — CLOBETASOL PROPIONATE 0.05 % EX CREA: 1.0000 "application " | TOPICAL_CREAM | Freq: Two times a day (BID) | CUTANEOUS | 1 refills | Status: DC

## 2020-10-08 MED ORDER — HYDROXYZINE HCL 50 MG PO TABS: 50.0000 mg | ORAL_TABLET | Freq: Three times a day (TID) | ORAL | 0 refills | Status: DC | PRN

## 2020-10-08 NOTE — Progress Notes (Signed)
Subjective:    Patient ID: [name], male    DOB: 01-Jan-1946, 75 y.o.   MRN: [id number]  HPI 75 year old male who  has a past medical history of Aortic stenosis, Carpal tunnel syndrome (09/24/2014), Coronary artery disease, COVID-19, Diabetes mellitus, type 2 ([place]), Erectile dysfunction, Essential hypertension, Hyperlipidemia, and Palpitations.  He presents to the office today for three month follow up regarding DM and HTN.   DM -  He is currently maintained on Metformin 1000 mg BID, Glipizide, 10 mg XR and Lantus 36 units daily. During his last visit his A1c was 8.5 - this was down slightly was 8.7. Lantus was increased from 32 units to 36 units.  He has been monitoring his blood sugars periodically.  Reports in the morning his blood sugars are usually higher in the 140s and in the evening his blood sugars are on the lower side of 120.  Has not had any hypoglycemic episodes  HTN - he is maintained on HCTZ 25 mg and Lopressor 25 mg BID. He denies dizziness, lightlessness, blurred vision  BP Readings from Last 3 Encounters:  10/08/20 120/62  09/13/20 140/70  07/13/20 138/73   Additionally, he reports that while he was working outside 2 days ago he stepped in a fire ant nest.  Was bitten extensively on bilateral legs and hands.  He developed hive-like lesions with intense itching.  Reports that the burning has resolved.  Has not been using any over-the-counter treatments.   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   Carpal tunnel syndrome 09/24/2014   Bilateral   Coronary artery disease    Multivessel status post CABG September 2017   COVID-19    Diabetes mellitus, type 2 ([place])    Erectile dysfunction    Essential hypertension    Hyperlipidemia    Palpitations    Cardiac monitor 03/2019: normal sinus rhythm, no AF, rare NSVT, VT    Social History   Socioeconomic History   Marital  status: Widowed    Spouse name: Not on file   Number of children: 5   Years of education: 10    Highest education level: 10th grade  Occupational History   Occupation: custodian    Comment: PT  Tobacco Use   Smoking status: Never   Smokeless tobacco: Never  Vaping Use   Vaping Use: Never used  Substance and Sexual Activity   Alcohol use: Yes    Alcohol/week: 1.0 standard drink    Types: 1 Glasses of wine per week    Comment: 1 x month   Drug use: No   Sexual activity: Yes  Other Topics Concern   Not on file  Social History Narrative   Widowed to [name] for 77 years; 2 daughters liver with him now   Works part time as Chiropodist   5 children; 1 son deceased   Never Smoked   Alcohol use- no   Caffeine use: occasional       Lost one child a few months ago    Lost his sister, then son and then nephew - all within 2 weeks apart   Son had a stroke at 73    Was planning to get married last month in Feb.    Social Determinants of Health   Financial Resource Strain: Low Risk    Difficulty of Paying Living Expenses: Not  hard at all  Food Insecurity: No Food Insecurity   Worried About Charity fundraiser in the Last Year: Never true   Ran Out of Food in the Last Year: Never true  Transportation Needs: No Transportation Needs   Lack of Transportation (Medical): No   Lack of Transportation (Non-Medical): No  Physical Activity: Insufficiently Active   Days of Exercise per Week: 3 days   Minutes of Exercise per Session: 20 min  Stress: No Stress Concern Present   Feeling of Stress : Not at all  Social Connections: Moderately Isolated   Frequency of Communication with Friends and Family: More than three times a week   Frequency of Social Gatherings with Friends and Family: More than three times a week   Attends Religious Services: More than 4 times per year   Active Member of [employer] or Organizations: No   Attends Archivist Meetings: Never   Marital  Status: Widowed  Intimate Partner Violence: Not At Risk   Fear of Current or Ex-Partner: No   Emotionally Abused: No   Physically Abused: No   Sexually Abused: No    Past Surgical History:  Procedure Laterality Date   AORTIC VALVE REPLACEMENT N/A 10/29/2015   Procedure: AORTIC VALVE REPLACEMENT (AVR) WITH 23MM MAGNA EASE TISSUE VALVE.;  Surgeon: [name], MD;  Location: [facility];  Service: Open Heart Surgery;  Laterality: N/A;   CARDIAC CATHETERIZATION N/A 10/27/2015   Procedure: Left Heart Cath and Coronary Angiography;  Surgeon: [name], MD;  Location: [facility];  Service: Cardiovascular;  Laterality: N/A;   CORONARY ARTERY BYPASS GRAFT N/A 10/29/2015   Procedure: CORONARY ARTERY BYPASS GRAFTING (CABG) x Four UTILIZING THE LEFT INTERNAL MAMMARY ARTERY AND ENDOSCOPICALLY HARVESTED RIGHT SAPEHENEOUS VEINS.;  Surgeon: [name], MD;  Location: [facility];  Service: Open Heart Surgery;  Laterality: N/A;   CYST REMOVAL HAND Right    ESOPHAGOGASTRODUODENOSCOPY N/A 12/02/2015   Procedure: ESOPHAGOGASTRODUODENOSCOPY (EGD);  Surgeon: [name], MD;  Location: [facility];  Service: Gastroenterology;  Laterality: N/A;   REPLACEMENT ASCENDING AORTA N/A 10/29/2015   Procedure: REPLACEMENT OF ASCENDING AORTA USING 34MM X 30CM WOVEN DOUBLE VELOUR VASCULAR GRAFT.;  Surgeon: [name], MD;  Location: [facility];  Service: Open Heart Surgery;  Laterality: N/A;   TEE WITHOUT CARDIOVERSION N/A 10/29/2015   Procedure: TRANSESOPHAGEAL ECHOCARDIOGRAM (TEE);  Surgeon: [name], MD;  Location: [facility];  Service: Open Heart Surgery;  Laterality: N/A;   UVULECTOMY N/A 12/10/2015   Procedure: UVULECTOMY;  Surgeon: [name], MD;  [facility];  Service: ENT;  Laterality: N/A;    Family History  Problem Relation Age of Onset   Cancer Father        Throat cancer   Diabetes Sister     Allergies  Allergen Reactions   Amiodarone Nausea And Vomiting    Quinolones     Patient was warned about not using Cipro and similar antibiotics. Recent studies have raised concern that fluoroquinolone antibiotics could be associated with an increased risk of aortic aneurysm Fluoroquinolones have non-antimicrobial properties that might jeopardise the integrity of the extracellular matrix of the vascular wall In a  propensity score matched cohort study in [place], there was a 66% increased rate of aortic aneurysm or dissection associated with oral fluoroquinolone use, compared wit    Current Outpatient Medications on File Prior to Visit  Medication Sig Dispense Refill   albuterol (VENTOLIN HFA) 108 (90 Base) MCG/ACT inhaler  Inhale 1-2 puffs into the lungs 2 (two) times daily as needed for wheezing or shortness of breath. 8 g 0   aspirin EC 81 MG tablet Take 81 mg by mouth daily.     Blood Glucose Monitoring Suppl (ACCU-CHEK GUIDE ME) w/Device KIT 1 Stick by Does not apply route 3 (three) times daily. Used to check blood sugar 3 times daily 1 kit 0   glipiZIDE (GLUCOTROL XL) 10 MG 24 hr tablet TAKE 1 TABLET (10 MG TOTAL) BY MOUTH IN THE MORNING AND AT BEDTIME. 180 tablet 0   glucose blood (ACCU-CHEK GUIDE) test strip Use as instructed 100 each 1   hydrochlorothiazide (HYDRODIURIL) 25 MG tablet Take 1 tablet (25 mg total) by mouth daily. 90 tablet 3   Insulin Pen Needle (B-D ULTRAFINE III SHORT PEN) 31G X 8 MM MISC Use QHS 90 each 3   metFORMIN (GLUCOPHAGE) 1000 MG tablet Take 1 tablet (1,000 mg total) by mouth 2 (two) times daily with a meal. 180 tablet 0   metoprolol tartrate (LOPRESSOR) 25 MG tablet TAKE 1 TABLET BY MOUTH TWICE A DAY 180 tablet 1   ondansetron (ZOFRAN) 4 MG tablet Take 1 tablet (4 mg total) by mouth every 6 (six) hours. 12 tablet 0   rosuvastatin (CRESTOR) 40 MG tablet TAKE 1 TABLET BY MOUTH EVERYDAY AT BEDTIME 90 tablet 1   insulin glargine (LANTUS SOLOSTAR) 100 UNIT/ML Solostar Pen Inject 36 Units into the skin daily. 33 mL 0   No current  facility-administered medications on file prior to visit.    BP 120/62   Pulse 80   Temp 99.3 F (37.4 C) (Oral)   Ht 6' (1.829 m)   Wt 185 lb (83.9 kg)   SpO2 98%   BMI 25.09 kg/m       Objective:   Physical Exam Vitals and nursing note reviewed.  Constitutional:      Appearance: Normal appearance.  Cardiovascular:     Rate and Rhythm: Normal rate and regular rhythm.     Pulses: Normal pulses.     Heart sounds: Normal heart sounds.  Pulmonary:     Effort: Pulmonary effort is normal.     Breath sounds: Normal breath sounds.  Musculoskeletal:        General: Normal range of motion.  Skin:    General: Skin is warm and dry.     Capillary Refill: Capillary refill takes less than 2 seconds.     Comments: Stents of bite marks throughout bilateral legs and hands.  Has hive-like lesions with surrounding erythremia at sting site.  Multiple have yellow fluid-filled pustule in the center.  Neurological:     Mental Status: He is alert.      Assessment & Plan:   1. Uncontrolled type 2 diabetes mellitus with diabetic neuropathy, with long-term current use of insulin (HCC)  - POC HgB A1c - 8.0  -A1c has improved.  We will have him start taking 18 units of Lantus in the morning and 18 units in the evening to see if we can get his blood sugars evened out.  He will follow-up in 3 months  2. Essential hypertension -Well-controlled.  No changes in medication  3. Venomous insect bite, accidental or unintentional, initial encounter  - clobetasol cream (TEMOVATE) 0.05 %; Apply 1 application topically 2 (two) times daily.  Dispense: 60 g; Refill: 1 - hydrOXYzine (ATARAX/VISTARIL) 50 MG tablet; Take 1 tablet (50 mg total) by mouth 3 (three) times daily as needed for itching.  Dispense: 60 tablet; Refill: 0 - Follow up if not improving in the next 2-3 days   [name], NP

## 2020-10-08 NOTE — Patient Instructions (Signed)
Please start using Lantus 18 units in the morning and 18 units at night  Follow up in three months

## 2020-10-14 DIAGNOSIS — E113311 Type 2 diabetes mellitus with moderate nonproliferative diabetic retinopathy with macular edema, right eye: Secondary | ICD-10-CM | POA: Diagnosis not present

## 2020-10-18 ENCOUNTER — Encounter: Payer: Self-pay | Admitting: Internal Medicine

## 2020-10-25 DIAGNOSIS — E113312 Type 2 diabetes mellitus with moderate nonproliferative diabetic retinopathy with macular edema, left eye: Secondary | ICD-10-CM | POA: Diagnosis not present

## 2020-11-04 ENCOUNTER — Encounter: Payer: Self-pay | Admitting: Internal Medicine

## 2020-11-04 DIAGNOSIS — E113312 Type 2 diabetes mellitus with moderate nonproliferative diabetic retinopathy with macular edema, left eye: Secondary | ICD-10-CM | POA: Diagnosis not present

## 2020-11-17 DIAGNOSIS — E113311 Type 2 diabetes mellitus with moderate nonproliferative diabetic retinopathy with macular edema, right eye: Secondary | ICD-10-CM | POA: Diagnosis not present

## 2020-12-06 DIAGNOSIS — E785 Hyperlipidemia, unspecified: Secondary | ICD-10-CM | POA: Diagnosis not present

## 2020-12-06 DIAGNOSIS — Z7984 Long term (current) use of oral hypoglycemic drugs: Secondary | ICD-10-CM | POA: Diagnosis not present

## 2020-12-06 DIAGNOSIS — Z794 Long term (current) use of insulin: Secondary | ICD-10-CM | POA: Diagnosis not present

## 2020-12-06 DIAGNOSIS — E1165 Type 2 diabetes mellitus with hyperglycemia: Secondary | ICD-10-CM | POA: Diagnosis not present

## 2020-12-06 DIAGNOSIS — Z833 Family history of diabetes mellitus: Secondary | ICD-10-CM | POA: Diagnosis not present

## 2020-12-06 DIAGNOSIS — I10 Essential (primary) hypertension: Secondary | ICD-10-CM | POA: Diagnosis not present

## 2020-12-06 DIAGNOSIS — I2510 Atherosclerotic heart disease of native coronary artery without angina pectoris: Secondary | ICD-10-CM | POA: Diagnosis not present

## 2020-12-06 DIAGNOSIS — Z7982 Long term (current) use of aspirin: Secondary | ICD-10-CM | POA: Diagnosis not present

## 2020-12-06 DIAGNOSIS — Z825 Family history of asthma and other chronic lower respiratory diseases: Secondary | ICD-10-CM | POA: Diagnosis not present

## 2020-12-16 DIAGNOSIS — E113312 Type 2 diabetes mellitus with moderate nonproliferative diabetic retinopathy with macular edema, left eye: Secondary | ICD-10-CM | POA: Diagnosis not present

## 2020-12-21 ENCOUNTER — Other Ambulatory Visit: Payer: Self-pay

## 2020-12-21 ENCOUNTER — Ambulatory Visit (AMBULATORY_SURGERY_CENTER): Payer: Self-pay | Admitting: *Deleted

## 2020-12-21 ENCOUNTER — Encounter: Payer: Self-pay | Admitting: Internal Medicine

## 2020-12-21 VITALS — Ht 72.0 in | Wt 188.0 lb

## 2020-12-21 DIAGNOSIS — Z86010 Personal history of colonic polyps: Secondary | ICD-10-CM

## 2020-12-21 MED ORDER — NA SULFATE-K SULFATE-MG SULF 17.5-3.13-1.6 GM/177ML PO SOLN: 1.0000 | ORAL | 0 refills | Status: DC

## 2020-12-21 NOTE — Progress Notes (Signed)
Patient is here in-person for PV. Patient denies any allergies to eggs or soy. Patient denies any problems with anesthesia/sedation. Patient is not on any oxygen at home. Patient is not taking any diet/weight loss medications or blood thinners. Patient is aware of our care-partner policy and Covid-19 safety protocol.   EMMI education assigned to the patient for the procedure, sent to MyChart.   Patient is COVID-19 vaccinated.  

## 2020-12-29 DIAGNOSIS — E113311 Type 2 diabetes mellitus with moderate nonproliferative diabetic retinopathy with macular edema, right eye: Secondary | ICD-10-CM | POA: Diagnosis not present

## 2021-01-04 ENCOUNTER — Encounter: Payer: Medicare PPO | Admitting: Internal Medicine

## 2021-01-10 ENCOUNTER — Encounter: Payer: Medicare PPO | Admitting: Internal Medicine

## 2021-01-11 ENCOUNTER — Ambulatory Visit: Payer: Medicare PPO | Admitting: Adult Health

## 2021-01-18 ENCOUNTER — Ambulatory Visit: Payer: Medicare PPO | Admitting: Adult Health

## 2021-01-18 ENCOUNTER — Encounter: Payer: Self-pay | Admitting: Adult Health

## 2021-01-18 VITALS — BP 136/84 | HR 67 | Temp 98.9°F | Ht 72.0 in | Wt 190.0 lb

## 2021-01-18 DIAGNOSIS — Z794 Long term (current) use of insulin: Secondary | ICD-10-CM | POA: Diagnosis not present

## 2021-01-18 DIAGNOSIS — E1169 Type 2 diabetes mellitus with other specified complication: Secondary | ICD-10-CM

## 2021-01-18 DIAGNOSIS — Z23 Encounter for immunization: Secondary | ICD-10-CM | POA: Diagnosis not present

## 2021-01-18 DIAGNOSIS — I10 Essential (primary) hypertension: Secondary | ICD-10-CM | POA: Diagnosis not present

## 2021-01-18 LAB — POCT GLYCOSYLATED HEMOGLOBIN (HGB A1C): Hemoglobin A1C: 8.2 % — AB (ref 4.0–5.6)

## 2021-01-18 MED ORDER — LANTUS SOLOSTAR 100 UNIT/ML ~~LOC~~ SOPN: 22.0000 [IU] | PEN_INJECTOR | Freq: Two times a day (BID) | SUBCUTANEOUS | 0 refills | Status: DC

## 2021-01-18 MED ORDER — ACCU-CHEK GUIDE VI STRP: ORAL_STRIP | 1 refills | Status: DC

## 2021-01-18 MED ORDER — BD PEN NEEDLE SHORT U/F 31G X 8 MM MISC: 3 refills | Status: DC

## 2021-01-18 MED ORDER — ACCU-CHEK SOFTCLIX LANCETS MISC: 12 refills | Status: AC

## 2021-01-18 NOTE — Progress Notes (Signed)
Subjective:    Patient ID: [name], male    DOB: [birth year], 75 y.o.   MRN: [id number]  HPI  75 year old male who  has a past medical history of Aortic stenosis, Blood transfusion without reported diagnosis, Carpal tunnel syndrome (09/24/2014), Coronary artery disease, COVID-19, Diabetes mellitus, type 2 ([place]), Erectile dysfunction, Essential hypertension, Hyperlipidemia, and Palpitations.  He presents to the office today for three month follow up regarding DM and HTN   DM - He is currently maintained on Metformin 1000 mg BID, Glipizide 10 mg XR and Lantus 18 units BID. During his last visit his Lantus was divided into BID dosing from daily due to having higher blood sugar readings in the evening. He checks his blood sugars periodically " I checked it in November and it was 140, but I have not checked it his month yet"   His last A1c was 8.0 in August 2023  HTN - maintained on HCTZ 25 mg and Lopressor 25 mg BID. He denies dizziness, lightheadedness, blurred vision, or headaches. He does check his blood pressure at home with readings in the 130's/80's.   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   Blood transfusion without reported diagnosis    Carpal tunnel syndrome 09/24/2014   Bilateral   Coronary artery disease    Multivessel status post CABG September 2017   COVID-19    Diabetes mellitus, type 2 ([place])    Erectile dysfunction    Essential hypertension    Hyperlipidemia    Palpitations    Cardiac monitor 03/2019: normal sinus rhythm, no AF, rare NSVT, VT    Social History   Socioeconomic History   Marital status: Widowed    Spouse name: Not on file   Number of children: 5   Years of education: 10    Highest education level: 10th grade  Occupational History   Occupation: custodian     Comment: PT  Tobacco Use   Smoking status: Never   Smokeless tobacco: Never  Vaping Use   Vaping Use: Never used  Substance and Sexual Activity   Alcohol use: Yes    Comment: 1 x month   Drug use: No   Sexual activity: Yes  Other Topics Concern   Not on file  Social History Narrative   Widowed to [name] for 60 years; 2 daughters liver with him now   Works part time as Chiropodist   5 children; 1 son deceased   Never Smoked   Alcohol use- no   Caffeine use: occasional       Lost one child a few months ago    62 his sister, then son and then nephew - all within 2 weeks apart   Son had a stroke at 22    Was planning to get married last month in Feb.    Social Determinants of Health   Financial Resource Strain: Low Risk    Difficulty of Paying Living Expenses: Not hard at all  Food Insecurity: No Food Insecurity   Worried About Charity fundraiser in the Last Year: Never true   Arboriculturist in the Last Year: Never true  Transportation Needs: No Transportation Needs   Lack of Transportation (Medical): No   Lack of Transportation (  Non-Medical): No  Physical Activity: Insufficiently Active   Days of Exercise per Week: 3 days   Minutes of Exercise per Session: 20 min  Stress: No Stress Concern Present   Feeling of Stress : Not at all  Social Connections: Moderately Isolated   Frequency of Communication with Friends and Family: More than three times a week   Frequency of Social Gatherings with Friends and Family: More than three times a week   Attends Religious Services: More than 4 times per year   Active Member of [employer] or Organizations: No   Attends Archivist Meetings: Never   Marital Status: Widowed  Intimate Partner Violence: Not At Risk   Fear of Current or Ex-Partner: No   Emotionally Abused: No   Physically Abused: No   Sexually Abused: No    Past Surgical History:  Procedure Laterality Date   AORTIC VALVE REPLACEMENT N/A  10/29/2015   Procedure: AORTIC VALVE REPLACEMENT (AVR) WITH 23MM MAGNA EASE TISSUE VALVE.;  Surgeon: [name], MD;  Location: [facility];  Service: Open Heart Surgery;  Laterality: N/A;   CARDIAC CATHETERIZATION N/A 10/27/2015   Procedure: Left Heart Cath and Coronary Angiography;  Surgeon: [name], MD;  Location: [facility];  Service: Cardiovascular;  Laterality: N/A;   COLONOSCOPY  09/02/2015   Dr.Pyrtle   CORONARY ARTERY BYPASS GRAFT N/A 10/29/2015   Procedure: CORONARY ARTERY BYPASS GRAFTING (CABG) x Four UTILIZING THE LEFT INTERNAL MAMMARY ARTERY AND ENDOSCOPICALLY HARVESTED RIGHT SAPEHENEOUS VEINS.;  Surgeon: [name], MD;  Location: [facility];  Service: Open Heart Surgery;  Laterality: N/A;   CYST REMOVAL HAND Right    ESOPHAGOGASTRODUODENOSCOPY N/A 12/02/2015   Procedure: ESOPHAGOGASTRODUODENOSCOPY (EGD);  Surgeon: [name], MD;  Location: [facility];  Service: Gastroenterology;  Laterality: N/A;   REPLACEMENT ASCENDING AORTA N/A 10/29/2015   Procedure: REPLACEMENT OF ASCENDING AORTA USING 34MM X 30CM WOVEN DOUBLE VELOUR VASCULAR GRAFT.;  Surgeon: [name], MD;  Location: [facility];  Service: Open Heart Surgery;  Laterality: N/A;   TEE WITHOUT CARDIOVERSION N/A 10/29/2015   Procedure: TRANSESOPHAGEAL ECHOCARDIOGRAM (TEE);  Surgeon: [name], MD;  Location: [facility];  Service: Open Heart Surgery;  Laterality: N/A;   UVULECTOMY N/A 12/10/2015   Procedure: UVULECTOMY;  Surgeon: [name], MD;  Location: [facility];  Service: ENT;  Laterality: N/A;    Family History  Problem Relation Age of Onset   Cancer Father        Throat cancer   Diabetes Sister    Colon cancer Neg Hx     Allergies  Allergen Reactions   Amiodarone Nausea And Vomiting   Quinolones     Patient was warned about not using Cipro and similar antibiotics. Recent studies have raised concern that fluoroquinolone antibiotics could be associated with an increased risk  of aortic aneurysm Fluoroquinolones have non-antimicrobial properties that might jeopardise the integrity of the extracellular matrix of the vascular wall In a  propensity score matched cohort study in [place], there was a 66% increased rate of aortic aneurysm or dissection associated with oral fluoroquinolone use, compared wit    Current Outpatient Medications on File Prior to Visit  Medication Sig Dispense Refill   aspirin EC 81 MG tablet Take 81 mg by mouth daily.     Blood Glucose Monitoring Suppl (ACCU-CHEK GUIDE ME) w/Device KIT 1 Stick by Does not apply route 3 (three) times daily. Used to check blood sugar 3 times daily 1 kit 0  clobetasol cream (TEMOVATE) 4.56 % Apply 1 application topically 2 (two) times daily. 60 g 1   glipiZIDE (GLUCOTROL XL) 10 MG 24 hr tablet TAKE 1 TABLET (10 MG TOTAL) BY MOUTH IN THE MORNING AND AT BEDTIME. 180 tablet 0   hydrochlorothiazide (HYDRODIURIL) 25 MG tablet Take 1 tablet (25 mg total) by mouth daily. 90 tablet 3   hydrOXYzine (ATARAX/VISTARIL) 50 MG tablet Take 1 tablet (50 mg total) by mouth 3 (three) times daily as needed for itching. 60 tablet 0   metFORMIN (GLUCOPHAGE) 1000 MG tablet Take 1 tablet (1,000 mg total) by mouth 2 (two) times daily with a meal. 180 tablet 0   metoprolol tartrate (LOPRESSOR) 25 MG tablet TAKE 1 TABLET BY MOUTH TWICE A DAY 180 tablet 1   Na Sulfate-K Sulfate-Mg Sulf 17.5-3.13-1.6 GM/177ML SOLN Take 1 kit by mouth as directed. May use generic Suprep 354 mL 0   ondansetron (ZOFRAN) 4 MG tablet Take 1 tablet (4 mg total) by mouth every 6 (six) hours. 12 tablet 0   rosuvastatin (CRESTOR) 40 MG tablet TAKE 1 TABLET BY MOUTH EVERYDAY AT BEDTIME 90 tablet 1   No current facility-administered medications on file prior to visit.    BP 136/84 (BP Location: Left Arm, Cuff Size: Normal)   Pulse 67   Temp 98.9 F (37.2 C) (Oral)   Ht 6' (1.829 m)   Wt 190 lb (86.2 kg)   SpO2 99%   BMI 25.77 kg/m       Objective:    Physical Exam Vitals and nursing note reviewed.  Constitutional:      Appearance: Normal appearance.  Cardiovascular:     Rate and Rhythm: Normal rate and regular rhythm.     Pulses: Normal pulses.     Heart sounds: Normal heart sounds.  Pulmonary:     Effort: Pulmonary effort is normal.     Breath sounds: Normal breath sounds.  Musculoskeletal:        General: Normal range of motion.  Skin:    General: Skin is warm and dry.     Capillary Refill: Capillary refill takes less than 2 seconds.  Neurological:     General: No focal deficit present.     Mental Status: He is alert and oriented to person, place, and time.      Assessment & Plan:  1. Type 2 diabetes mellitus with other specified complication, unspecified whether long term insulin use (HCC)  - POC HgB A1c- 8.3  - Will increase Lantus to 22 units BID  - insulin glargine (LANTUS SOLOSTAR) 100 UNIT/ML Solostar Pen; Inject 22 Units into the skin 2 (two) times daily.  Dispense: 40 mL; Refill: 0 - Insulin Pen Needle (B-D ULTRAFINE III SHORT PEN) 31G X 8 MM MISC; Use BID  Dispense: 200 each; Refill: 3 - glucose blood (ACCU-CHEK GUIDE) test strip; Use as instructed  Dispense: 100 each; Refill: 1 - Accu-Chek Softclix Lancets lancets; Use as instructed  Dispense: 100 each; Refill: 12   2. Essential hypertension - BP well controlled.  - No change in medications   [name], NP

## 2021-01-18 NOTE — Patient Instructions (Addendum)
Your A1c was 8.3  I am going to increase your insulin to 22 units twice a day   Follow up in three months for your physical exam

## 2021-01-19 DIAGNOSIS — Z794 Long term (current) use of insulin: Secondary | ICD-10-CM | POA: Diagnosis not present

## 2021-01-19 DIAGNOSIS — I10 Essential (primary) hypertension: Secondary | ICD-10-CM | POA: Diagnosis not present

## 2021-01-19 DIAGNOSIS — Z23 Encounter for immunization: Secondary | ICD-10-CM | POA: Diagnosis not present

## 2021-01-19 DIAGNOSIS — E1169 Type 2 diabetes mellitus with other specified complication: Secondary | ICD-10-CM | POA: Diagnosis not present

## 2021-01-24 ENCOUNTER — Encounter: Payer: Self-pay | Admitting: Internal Medicine

## 2021-01-24 ENCOUNTER — Ambulatory Visit (AMBULATORY_SURGERY_CENTER): Payer: Medicare PPO | Admitting: Internal Medicine

## 2021-01-24 VITALS — BP 133/79 | HR 56 | Temp 97.5°F | Resp 16 | Ht 72.0 in | Wt 188.0 lb

## 2021-01-24 DIAGNOSIS — Z86010 Personal history of colonic polyps: Secondary | ICD-10-CM | POA: Diagnosis not present

## 2021-01-24 DIAGNOSIS — I10 Essential (primary) hypertension: Secondary | ICD-10-CM | POA: Diagnosis not present

## 2021-01-24 DIAGNOSIS — I2510 Atherosclerotic heart disease of native coronary artery without angina pectoris: Secondary | ICD-10-CM | POA: Diagnosis not present

## 2021-01-24 MED ORDER — SODIUM CHLORIDE 0.9 % IV SOLN: 500.0000 mL | INTRAVENOUS | Status: DC

## 2021-01-24 NOTE — Progress Notes (Signed)
No problems noted in the recovery room. maw 

## 2021-01-24 NOTE — Patient Instructions (Addendum)
Handouts were given to your care partner on diverticulosis and hemorrhoids. Your sugar was 137 in the recovery room. You may resume your current medications today. Await biopsy results.  May take 1-3 weeks to receive pathology results. Please call if any questions or concerns.       YOU HAD AN ENDOSCOPIC PROCEDURE TODAY AT [facility]:   Refer to the procedure report that was given to you for any specific questions about what was found during the examination.  If the procedure report does not answer your questions, please call your gastroenterologist to clarify.  If you requested that your care partner not be given the details of your procedure findings, then the procedure report has been included in a sealed envelope for you to review at your convenience later.  YOU SHOULD EXPECT: Some feelings of bloating in the abdomen. Passage of more gas than usual.  Walking can help get rid of the air that was put into your GI tract during the procedure and reduce the bloating. If you had a lower endoscopy (such as a colonoscopy or flexible sigmoidoscopy) you may notice spotting of blood in your stool or on the toilet paper. If you underwent a bowel prep for your procedure, you may not have a normal bowel movement for a few days.  Please Note:  You might notice some irritation and congestion in your nose or some drainage.  This is from the oxygen used during your procedure.  There is no need for concern and it should clear up in a day or so.  SYMPTOMS TO REPORT IMMEDIATELY:  Following lower endoscopy (colonoscopy or flexible sigmoidoscopy):  Excessive amounts of blood in the stool  Significant tenderness or worsening of abdominal pains  Swelling of the abdomen that is new, acute  Fever of 100F or higher   For urgent or emergent issues, a gastroenterologist can be reached at any hour by calling [phone]. Do not use MyChart messaging for urgent concerns.    DIET:  We do  recommend a small meal at first, but then you may proceed to your regular diet.  Drink plenty of fluids but you should avoid alcoholic beverages for 24 hours.  ACTIVITY:  You should plan to take it easy for the rest of today and you should NOT DRIVE or use heavy machinery until tomorrow (because of the sedation medicines used during the test).    FOLLOW UP: Our staff will call the number listed on your records 48-72 hours following your procedure to check on you and address any questions or concerns that you may have regarding the information given to you following your procedure. If we do not reach you, we will leave a message.  We will attempt to reach you two times.  During this call, we will ask if you have developed any symptoms of COVID 19. If you develop any symptoms (ie: fever, flu-like symptoms, shortness of breath, cough etc.) before then, please call [phone].  If you test positive for Covid 19 in the 2 weeks post procedure, please call and report this information to [place].    If any biopsies were taken you will be contacted by phone or by letter within the next 1-3 weeks.  Please call us at [phone] if you have not heard about the biopsies in 3 weeks.    SIGNATURES/CONFIDENTIALITY: You and/or your care partner have signed paperwork which will be entered into your electronic medical record.  These signatures attest to the fact  that that the information above on your After Visit Summary has been reviewed and is understood.  Full responsibility of the confidentiality of this discharge information lies with you and/or your care-partner.

## 2021-01-24 NOTE — Progress Notes (Signed)
GASTROENTEROLOGY PROCEDURE H&P NOTE   Primary Care Physician: [name], NP    Reason for Procedure:  Personal history of adenomatous colon polyps  Plan:    Surveillance colonoscopy  Patient is appropriate for endoscopic procedure(s) in the ambulatory ([place]) setting.  The nature of the procedure, as well as the risks, benefits, and alternatives were carefully and thoroughly reviewed with the patient. Ample time for discussion and questions allowed. The patient understood, was satisfied, and agreed to proceed.     HPI: [name] is a 75 y.o. male who presents for colonoscopy.  Medical history as below.  Last exam July 2017.  Tolerated the prep.  No recent chest pain or shortness of breath.  No abdominal pain today.  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   Blood transfusion without reported diagnosis    Carpal tunnel syndrome 09/24/2014   Bilateral   Coronary artery disease    Multivessel status post CABG September 2017   COVID-19    Diabetes mellitus, type 2 ([place])    Erectile dysfunction    Essential hypertension    Hyperlipidemia    Palpitations    Cardiac monitor 03/2019: normal sinus rhythm, no AF, rare NSVT, VT    Past Surgical History:  Procedure Laterality Date   AORTIC VALVE REPLACEMENT N/A 10/29/2015   Procedure: AORTIC VALVE REPLACEMENT (AVR) WITH 23MM MAGNA EASE TISSUE VALVE.;  Surgeon: [name], MD;  Location: [place];  Service: Open Heart Surgery;  Laterality: N/A;   CARDIAC CATHETERIZATION N/A 10/27/2015   Procedure: Left Heart Cath and Coronary Angiography;  Surgeon: [name], MD;  Location: [facility];  Service: Cardiovascular;  Laterality: N/A;   COLONOSCOPY  09/02/2015   Dr.Destynie Toomey   CORONARY ARTERY BYPASS GRAFT N/A 10/29/2015   Procedure: CORONARY ARTERY BYPASS GRAFTING (CABG) x Four UTILIZING THE LEFT INTERNAL MAMMARY ARTERY AND ENDOSCOPICALLY HARVESTED  RIGHT SAPEHENEOUS VEINS.;  Surgeon: [name], MD;  Location: [place];  Service: Open Heart Surgery;  Laterality: N/A;   CYST REMOVAL HAND Right    ESOPHAGOGASTRODUODENOSCOPY N/A 12/02/2015   Procedure: ESOPHAGOGASTRODUODENOSCOPY (EGD);  Surgeon: [name], MD;  Location: [place];  Service: Gastroenterology;  Laterality: N/A;   REPLACEMENT ASCENDING AORTA N/A 10/29/2015   Procedure: REPLACEMENT OF ASCENDING AORTA USING 34MM X 30CM WOVEN DOUBLE VELOUR VASCULAR GRAFT.;  Surgeon: [name], MD;  Location: [facility];  Service: Open Heart Surgery;  Laterality: N/A;   TEE WITHOUT CARDIOVERSION N/A 10/29/2015   Procedure: TRANSESOPHAGEAL ECHOCARDIOGRAM (TEE);  Surgeon: [name], MD;  Location: [place];  Service: Open Heart Surgery;  Laterality: N/A;   UVULECTOMY N/A 12/10/2015   Procedure: UVULECTOMY;  Surgeon: [name], MD;  Location: [place];  Service: ENT;  Laterality: N/A;    Prior to Admission medications   Medication Sig Start Date End Date Taking? Authorizing Provider  aspirin EC 81 MG tablet Take 81 mg by mouth daily.   Yes [provider]  glipiZIDE (GLUCOTROL XL) 10 MG 24 hr tablet TAKE 1 TABLET (10 MG TOTAL) BY MOUTH IN THE MORNING AND AT BEDTIME. 06/24/20  Yes Nafziger, Tommi Rumps, NP  insulin glargine (LANTUS SOLOSTAR) 100 UNIT/ML Solostar Pen Inject 22 Units into the skin 2 (two) times daily. 01/18/21 04/18/21 Yes Nafziger, Tommi Rumps, NP  metFORMIN (GLUCOPHAGE) 1000 MG tablet Take 1 tablet (1,000 mg total) by mouth 2 (two) times daily with a meal.  07/08/20  Yes Nafziger, Tommi Rumps, NP  metoprolol tartrate (LOPRESSOR) 25 MG tablet TAKE 1 TABLET BY MOUTH TWICE A DAY 03/01/20  Yes [name], [name], MD  rosuvastatin (CRESTOR) 40 MG tablet TAKE 1 TABLET BY MOUTH EVERYDAY AT BEDTIME 09/29/20  Yes Nafziger, Tommi Rumps, NP  Accu-Chek Softclix Lancets lancets Use as instructed 01/18/21   Nafziger, Tommi Rumps, NP  Blood Glucose Monitoring Suppl (ACCU-CHEK GUIDE ME) w/Device KIT 1 Stick  by Does not apply route 3 (three) times daily. Used to check blood sugar 3 times daily 03/06/19   Nafziger, Tommi Rumps, NP  clobetasol cream (TEMOVATE) 4.23 % Apply 1 application topically 2 (two) times daily. 10/08/20   Nafziger, Tommi Rumps, NP  glucose blood (ACCU-CHEK GUIDE) test strip Use as instructed 01/18/21   Nafziger, Tommi Rumps, NP  hydrochlorothiazide (HYDRODIURIL) 25 MG tablet Take 1 tablet (25 mg total) by mouth daily. Patient not taking: Reported on 01/24/2021 04/02/20   [name], NP  hydrOXYzine (ATARAX/VISTARIL) 50 MG tablet Take 1 tablet (50 mg total) by mouth 3 (three) times daily as needed for itching. Patient not taking: Reported on 01/24/2021 10/08/20   [name], NP  Insulin Pen Needle (B-D ULTRAFINE III SHORT PEN) 31G X 8 MM MISC Use BID 01/18/21   Nafziger, Tommi Rumps, NP  ondansetron (ZOFRAN) 4 MG tablet Take 1 tablet (4 mg total) by mouth every 6 (six) hours. 07/13/20   Lennice Sites, DO    Current Outpatient Medications  Medication Sig Dispense Refill   aspirin EC 81 MG tablet Take 81 mg by mouth daily.     glipiZIDE (GLUCOTROL XL) 10 MG 24 hr tablet TAKE 1 TABLET (10 MG TOTAL) BY MOUTH IN THE MORNING AND AT BEDTIME. 180 tablet 0   insulin glargine (LANTUS SOLOSTAR) 100 UNIT/ML Solostar Pen Inject 22 Units into the skin 2 (two) times daily. 40 mL 0   metFORMIN (GLUCOPHAGE) 1000 MG tablet Take 1 tablet (1,000 mg total) by mouth 2 (two) times daily with a meal. 180 tablet 0   metoprolol tartrate (LOPRESSOR) 25 MG tablet TAKE 1 TABLET BY MOUTH TWICE A DAY 180 tablet 1   rosuvastatin (CRESTOR) 40 MG tablet TAKE 1 TABLET BY MOUTH EVERYDAY AT BEDTIME 90 tablet 1   Accu-Chek Softclix Lancets lancets Use as instructed 100 each 12   Blood Glucose Monitoring Suppl (ACCU-CHEK GUIDE ME) w/Device KIT 1 Stick by Does not apply route 3 (three) times daily. Used to check blood sugar 3 times daily 1 kit 0   clobetasol cream (TEMOVATE) 9.53 % Apply 1 application topically 2 (two) times daily. 60 g 1    glucose blood (ACCU-CHEK GUIDE) test strip Use as instructed 100 each 1   hydrochlorothiazide (HYDRODIURIL) 25 MG tablet Take 1 tablet (25 mg total) by mouth daily. (Patient not taking: Reported on 01/24/2021) 90 tablet 3   hydrOXYzine (ATARAX/VISTARIL) 50 MG tablet Take 1 tablet (50 mg total) by mouth 3 (three) times daily as needed for itching. (Patient not taking: Reported on 01/24/2021) 60 tablet 0   Insulin Pen Needle (B-D ULTRAFINE III SHORT PEN) 31G X 8 MM MISC Use BID 200 each 3   ondansetron (ZOFRAN) 4 MG tablet Take 1 tablet (4 mg total) by mouth every 6 (six) hours. 12 tablet 0   Current Facility-Administered Medications  Medication Dose Route Frequency Provider Last Rate Last Admin   0.9 %  sodium chloride infusion  500 mL Intravenous Continuous Jeremy Mclamb, Lajuan Lines, MD        Allergies as of 01/24/2021 -  Review Complete 01/24/2021  Allergen Reaction Noted   Amiodarone Nausea And Vomiting 01/20/2016   Quinolones  11/07/2018    Family History  Problem Relation Age of Onset   Cancer Father        Throat cancer   Diabetes Sister    Colon cancer Neg Hx     Social History   Socioeconomic History   Marital status: Widowed    Spouse name: Not on file   Number of children: 5   Years of education: 10    Highest education level: 10th grade  Occupational History   Occupation: custodian    Comment: PT  Tobacco Use   Smoking status: Never   Smokeless tobacco: Never  Vaping Use   Vaping Use: Never used  Substance and Sexual Activity   Alcohol use: Yes    Comment: 1 x month   Drug use: No   Sexual activity: Yes  Other Topics Concern   Not on file  Social History Narrative   Widowed to [name] for 48 years; 2 daughters liver with him now   Works part time as Chiropodist   5 children; 1 son deceased   Never Smoked   Alcohol use- no   Caffeine use: occasional       Lost one child a few months ago    29 his sister, then son and then nephew - all within 2  weeks apart   Son had a stroke at 32    Was planning to get married last month in Feb.    Social Determinants of Health   Financial Resource Strain: Low Risk    Difficulty of Paying Living Expenses: Not hard at all  Food Insecurity: No Food Insecurity   Worried About Charity fundraiser in the Last Year: Never true   Arboriculturist in the Last Year: Never true  Transportation Needs: No Transportation Needs   Lack of Transportation (Medical): No   Lack of Transportation (Non-Medical): No  Physical Activity: Insufficiently Active   Days of Exercise per Week: 3 days   Minutes of Exercise per Session: 20 min  Stress: No Stress Concern Present   Feeling of Stress : Not at all  Social Connections: Moderately Isolated   Frequency of Communication with Friends and Family: More than three times a week   Frequency of Social Gatherings with Friends and Family: More than three times a week   Attends Religious Services: More than 4 times per year   Active Member of [employer] or Organizations: No   Attends Archivist Meetings: Never   Marital Status: Widowed  Human resources officer Violence: Not At Risk   Fear of Current or Ex-Partner: No   Emotionally Abused: No   Physically Abused: No   Sexually Abused: No    Physical Exam: Vital signs in last 24 hours: '@BP'  (!) 152/76   Pulse 62   Temp (!) 97.5 F (36.4 C) (Temporal)   Resp 16   Ht 6' (1.829 m)   Wt 188 lb (85.3 kg)   SpO2 98%   BMI 25.50 kg/m  GEN: NAD EYE: Sclerae anicteric ENT: MMM CV: Non-tachycardic Pulm: CTA b/l GI: Soft, NT/ND NEURO:  Alert & Oriented x 3   Zenovia Jarred, MD [facility]  01/24/2021 1:40 PM

## 2021-01-24 NOTE — Op Note (Signed)
[facility] Patient Name: [name] Procedure Date: 01/24/2021 1:22 PM MRN: [id number] Endoscopist: Jerene Bears , MD Age: 75 Referring MD:  Date of Birth: 03/02/1945 Gender: Male Account #: [id number] Procedure:                Colonoscopy Indications:              High risk colon cancer surveillance: Personal                            history of non-advanced adenoma, Last colonoscopy:                            July 2017 Medicines:                Monitored Anesthesia Care Procedure:                Pre-Anesthesia Assessment:                           - Prior to the procedure, a History and Physical                            was performed, and patient medications and                            allergies were reviewed. The patient's tolerance of                            previous anesthesia was also reviewed. The risks                            and benefits of the procedure and the sedation                            options and risks were discussed with the patient.                            All questions were answered, and informed consent                            was obtained. Prior Anticoagulants: The patient has                            taken no previous anticoagulant or antiplatelet                            agents. ASA Grade Assessment: III - A patient with                            severe systemic disease. After reviewing the risks                            and benefits, the patient was deemed in  satisfactory condition to undergo the procedure.                           After obtaining informed consent, the colonoscope                            was passed under direct vision. Throughout the                            procedure, the patient's blood pressure, pulse, and                            oxygen saturations were monitored continuously. The                            Olympus CF-HQ190L ([id number]) Colonoscope was                             introduced through the anus and advanced to the                            cecum, identified by appendiceal orifice and                            ileocecal valve. The colonoscopy was performed                            without difficulty. The patient tolerated the                            procedure well. The quality of the bowel                            preparation was good. The ileocecal valve,                            appendiceal orifice, and rectum were photographed. Scope In: 1:42:24 PM Scope Out: 1:54:26 PM Scope Withdrawal Time: 0 hours 9 minutes 40 seconds  Total Procedure Duration: 0 hours 12 minutes 2 seconds  Findings:                 The digital rectal exam was normal.                           Multiple small and large-mouthed diverticula were                            found in the sigmoid colon, descending colon and                            transverse colon. There was no evidence of                            diverticular bleeding.  Internal hemorrhoids were found during                            retroflexion. The hemorrhoids were small.                           The exam was otherwise without abnormality. Complications:            No immediate complications. Estimated Blood Loss:     Estimated blood loss: none. Impression:               - Moderate diverticulosis in the sigmoid colon, in                            the descending colon and in the transverse colon.                            There was no evidence of diverticular bleeding.                           - Small internal hemorrhoids.                           - The examination was otherwise normal.                           - No specimens collected. Recommendation:           - Patient has a contact number available for                            emergencies. The signs and symptoms of potential                            delayed complications were discussed with  the                            patient. Return to normal activities tomorrow.                            Written discharge instructions were provided to the                            patient.                           - Resume previous diet.                           - Continue present medications.                           - No repeat colonoscopy due to age at next interval                            and the absence of colonic polyps on today's exam. Jerene Bears, MD 01/24/2021 1:57:12 PM  This report has been signed electronically.

## 2021-01-24 NOTE — Progress Notes (Signed)
Pt's states no medical or surgical changes since previsit or office visit. 

## 2021-01-26 ENCOUNTER — Telehealth: Payer: Self-pay

## 2021-01-26 ENCOUNTER — Telehealth: Payer: Self-pay | Admitting: *Deleted

## 2021-01-26 DIAGNOSIS — E113312 Type 2 diabetes mellitus with moderate nonproliferative diabetic retinopathy with macular edema, left eye: Secondary | ICD-10-CM | POA: Diagnosis not present

## 2021-01-26 NOTE — Telephone Encounter (Signed)
No answer, left message to call back later today, B.Daysie Helf RN. 

## 2021-01-26 NOTE — Telephone Encounter (Signed)
°  Follow up Call-  Call back number 01/24/2021  Post procedure Call Back phone  # [phone]  Permission to leave phone message Yes  Some recent data might be hidden     Patient questions:  Do you have a fever, pain , or abdominal swelling? No. Pain Score  0 *  Have you tolerated food without any problems? Yes.    Have you been able to return to your normal activities? Yes.    Do you have any questions about your discharge instructions: Diet   No. Medications  No. Follow up visit  No.  Do you have questions or concerns about your Care? No.  Actions: * If pain score is 4 or above: No action needed, pain <4.  Have you developed a fever since your procedure? no  2.   Have you had an respiratory symptoms (SOB or cough) since your procedure? no  3.   Have you tested positive for COVID 19 since your procedure no  4.   Have you had any family members/close contacts diagnosed with the COVID 19 since your procedure?  no   If yes to any of these questions please route to Joylene John, RN and Joella Prince, RN

## 2021-02-02 DIAGNOSIS — E113311 Type 2 diabetes mellitus with moderate nonproliferative diabetic retinopathy with macular edema, right eye: Secondary | ICD-10-CM | POA: Diagnosis not present

## 2021-02-18 ENCOUNTER — Other Ambulatory Visit: Payer: Self-pay | Admitting: Adult Health

## 2021-02-18 ENCOUNTER — Ambulatory Visit (INDEPENDENT_AMBULATORY_CARE_PROVIDER_SITE_OTHER): Payer: Medicare PPO

## 2021-02-18 VITALS — BP 120/62 | HR 78 | Temp 98.1°F | Ht 72.0 in | Wt 176.5 lb

## 2021-02-18 DIAGNOSIS — Z0000 Encounter for general adult medical examination without abnormal findings: Secondary | ICD-10-CM

## 2021-02-18 DIAGNOSIS — I10 Essential (primary) hypertension: Secondary | ICD-10-CM

## 2021-02-18 DIAGNOSIS — E1140 Type 2 diabetes mellitus with diabetic neuropathy, unspecified: Secondary | ICD-10-CM

## 2021-02-18 NOTE — Patient Instructions (Addendum)
[name] , Thank you for taking time to come for your Medicare Wellness Visit. I appreciate your ongoing commitment to your health goals. Please review the following plan we discussed and let me know if I can assist you in the future.   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         This is a list of the screening recommended for you and due dates:  Health Maintenance  Topic Date Due   COVID-19 Vaccine (2 - Pfizer risk series) 07/29/2019   Urine Protein Check  12/05/2019   Eye exam for diabetics  02/18/2021*   Zoster (Shingles) Vaccine (1 of 2) 05/19/2021*   Hemoglobin A1C  07/19/2021   Complete foot exam   01/18/2022   Pneumonia Vaccine  Completed   Flu Shot  Completed   Hepatitis C Screening: USPSTF Recommendation to screen - Ages 18-79 yo.  Completed   HPV Vaccine  Aged Out   Colon Cancer Screening  Discontinued   Tetanus Vaccine  Discontinued  *Topic was postponed. The date shown is not the original due date.   Advanced directives: Yes  Conditions/risks identified: None  Next appointment: Follow up in one year for your annual wellness visit. Preventive Care 42 Years and Older, Male Preventive care refers to lifestyle choices and visits with your health care provider that can promote health and wellness. What does preventive care include? A yearly physical exam. This is also called an annual well check. Dental exams once or twice a year. Routine eye exams. Ask your health care provider how often you should have your eyes checked. Personal lifestyle choices, including: Daily care of your teeth and gums. Regular physical activity. Eating a healthy diet. Avoiding tobacco and drug use. Limiting alcohol use. Practicing safe sex. Taking low doses of aspirin every day. Taking  vitamin and mineral supplements as recommended by your health care provider. What happens during an annual well check? The services and screenings done by your health care provider during your annual well check will depend on your age, overall health, lifestyle risk factors, and family history of disease. Counseling  Your health care provider may ask you questions about your: Alcohol use. Tobacco use. Drug use. Emotional well-being. Home and relationship well-being. Sexual activity. Eating habits. History of falls. Memory and ability to understand (cognition). Work and work Statistician. Screening  You may have the following tests or measurements: Height, weight, and BMI. Blood pressure. Lipid and cholesterol levels. These may be checked every 5 years, or more frequently if you are over 63 years old. Skin check. Lung cancer screening. You may have this screening every year starting at age 2 if you have a 30-pack-year history of smoking and currently smoke or have quit within the past 15 years. Fecal occult blood test (FOBT) of the stool. You may have this test every year starting at age 99. Flexible sigmoidoscopy or colonoscopy. You may have a sigmoidoscopy every 5 years or a colonoscopy every 10 years starting at age 60. Prostate cancer screening. Recommendations will vary depending on your family history and other risks. Hepatitis C blood test. Hepatitis B blood test. Sexually transmitted disease (STD) testing.  Diabetes screening. This is done by checking your blood sugar (glucose) after you have not eaten for a while (fasting). You may have this done every 1-3 years. Abdominal aortic aneurysm (AAA) screening. You may need this if you are a current or former smoker. Osteoporosis. You may be screened starting at age 30 if you are at high risk. Talk with your health care provider about your test results, treatment options, and if necessary, the need for more tests. Vaccines  Your  health care provider may recommend certain vaccines, such as: Influenza vaccine. This is recommended every year. Tetanus, diphtheria, and acellular pertussis (Tdap, Td) vaccine. You may need a Td booster every 10 years. Zoster vaccine. You may need this after age 33. Pneumococcal 13-valent conjugate (PCV13) vaccine. One dose is recommended after age 87. Pneumococcal polysaccharide (PPSV23) vaccine. One dose is recommended after age 73. Talk to your health care provider about which screenings and vaccines you need and how often you need them. This information is not intended to replace advice given to you by your health care provider. Make sure you discuss any questions you have with your health care provider. Document Released: 02/26/2015 Document Revised: 10/20/2015 Document Reviewed: 12/01/2014 Elsevier Interactive Patient Education  2017 [employer] Prevention in the Home Falls can cause injuries. They can happen to people of all ages. There are many things you can do to make your home safe and to help prevent falls. What can I do on the outside of my home? Regularly fix the edges of walkways and driveways and fix any cracks. Remove anything that might make you trip as you walk through a door, such as a raised step or threshold. Trim any bushes or trees on the path to your home. Use bright outdoor lighting. Clear any walking paths of anything that might make someone trip, such as rocks or tools. Regularly check to see if handrails are loose or broken. Make sure that both sides of any steps have handrails. Any raised decks and porches should have guardrails on the edges. Have any leaves, snow, or ice cleared regularly. Use sand or salt on walking paths during winter. Clean up any spills in your garage right away. This includes oil or grease spills. What can I do in the bathroom? Use night lights. Install grab bars by the toilet and in the tub and shower. Do not use towel bars as  grab bars. Use non-skid mats or decals in the tub or shower. If you need to sit down in the shower, use a plastic, non-slip stool. Keep the floor dry. Clean up any water that spills on the floor as soon as it happens. Remove soap buildup in the tub or shower regularly. Attach bath mats securely with double-sided non-slip rug tape. Do not have throw rugs and other things on the floor that can make you trip. What can I do in the bedroom? Use night lights. Make sure that you have a light by your bed that is easy to reach. Do not use any sheets or blankets that are too big for your bed. They should not hang down onto the floor. Have a firm chair that has side arms. You can use this for support while you get dressed. Do not have throw rugs and other things on the floor that can make you trip. What can I do in the kitchen? Clean up any spills right away. Avoid walking on wet floors. Keep items that you use a lot in easy-to-reach  places. If you need to reach something above you, use a strong step stool that has a grab bar. Keep electrical cords out of the way. Do not use floor polish or wax that makes floors slippery. If you must use wax, use non-skid floor wax. Do not have throw rugs and other things on the floor that can make you trip. What can I do with my stairs? Do not leave any items on the stairs. Make sure that there are handrails on both sides of the stairs and use them. Fix handrails that are broken or loose. Make sure that handrails are as long as the stairways. Check any carpeting to make sure that it is firmly attached to the stairs. Fix any carpet that is loose or worn. Avoid having throw rugs at the top or bottom of the stairs. If you do have throw rugs, attach them to the floor with carpet tape. Make sure that you have a light switch at the top of the stairs and the bottom of the stairs. If you do not have them, ask someone to add them for you. What else can I do to help prevent  falls? Wear shoes that: Do not have high heels. Have rubber bottoms. Are comfortable and fit you well. Are closed at the toe. Do not wear sandals. If you use a stepladder: Make sure that it is fully opened. Do not climb a closed stepladder. Make sure that both sides of the stepladder are locked into place. Ask someone to hold it for you, if possible. Clearly mark and make sure that you can see: Any grab bars or handrails. First and last steps. Where the edge of each step is. Use tools that help you move around (mobility aids) if they are needed. These include: Canes. Walkers. Scooters. Crutches. Turn on the lights when you go into a dark area. Replace any light bulbs as soon as they burn out. Set up your furniture so you have a clear path. Avoid moving your furniture around. If any of your floors are uneven, fix them. If there are any pets around you, be aware of where they are. Review your medicines with your doctor. Some medicines can make you feel dizzy. This can increase your chance of falling. Ask your doctor what other things that you can do to help prevent falls. This information is not intended to replace advice given to you by your health care provider. Make sure you discuss any questions you have with your health care provider. Document Released: 11/26/2008 Document Revised: 07/08/2015 Document Reviewed: 03/06/2014 Elsevier Interactive Patient Education  2017 [employer].

## 2021-02-18 NOTE — Progress Notes (Signed)
Subjective:   [name] is a 76 y.o. male who presents for Medicare Annual/Subsequent preventive examination.  Review of Systems    No ROS      Objective:    Today's Vitals   02/18/21 1359  BP: 120/62  Pulse: 78  Temp: 98.1 F (36.7 C)  TempSrc: Oral  Weight: 176 lb 8 oz (80.1 kg)  Height: 6' (1.829 m)   Body mass index is 23.94 kg/m.  Advanced Directives 02/18/2021 07/13/2020 03/25/2020 02/18/2020 02/18/2019 02/08/2019 01/27/2019  Does Patient Have a Medical Advance Directive? Yes No No No Yes Yes No  Type of Paramedic of [place];Living will - - - [place];Living will [place];Living will -  Does patient want to make changes to medical advance directive? No - Patient declined - - - No - Patient declined - -  Copy of [place] in Chart? No - copy requested - - - - - -  Would patient like information on creating a medical advance directive? - No - Patient declined No - Patient declined No - Patient declined - - Yes (MAU/Ambulatory/Procedural Areas - Information given)    Current Medications (verified) Outpatient Encounter Medications as of 02/18/2021  Medication Sig   Accu-Chek Softclix Lancets lancets Use as instructed   aspirin EC 81 MG tablet Take 81 mg by mouth daily.   Blood Glucose Monitoring Suppl (ACCU-CHEK GUIDE ME) w/Device KIT 1 Stick by Does not apply route 3 (three) times daily. Used to check blood sugar 3 times daily   clobetasol cream (TEMOVATE) 0.97 % Apply 1 application topically 2 (two) times daily.   glipiZIDE (GLUCOTROL XL) 10 MG 24 hr tablet TAKE 1 TABLET (10 MG TOTAL) BY MOUTH IN THE MORNING AND AT BEDTIME.   glucose blood (ACCU-CHEK GUIDE) test strip Use as instructed   hydrochlorothiazide (HYDRODIURIL) 25 MG tablet Take 1 tablet (25 mg total) by mouth daily. (Patient not taking: Reported on 01/24/2021)   hydrOXYzine (ATARAX/VISTARIL) 50 MG tablet Take 1 tablet (50 mg  total) by mouth 3 (three) times daily as needed for itching. (Patient not taking: Reported on 01/24/2021)   insulin glargine (LANTUS SOLOSTAR) 100 UNIT/ML Solostar Pen Inject 22 Units into the skin 2 (two) times daily.   Insulin Pen Needle (B-D ULTRAFINE III SHORT PEN) 31G X 8 MM MISC Use BID   metFORMIN (GLUCOPHAGE) 1000 MG tablet TAKE 1 TABLET (1,000 MG TOTAL) BY MOUTH 2 (TWO) TIMES DAILY WITH A MEAL.   metoprolol tartrate (LOPRESSOR) 25 MG tablet TAKE 1 TABLET BY MOUTH TWICE A DAY   ondansetron (ZOFRAN) 4 MG tablet Take 1 tablet (4 mg total) by mouth every 6 (six) hours.   rosuvastatin (CRESTOR) 40 MG tablet TAKE 1 TABLET BY MOUTH EVERYDAY AT BEDTIME   No facility-administered encounter medications on file as of 02/18/2021.    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   Coronary artery disease    Multivessel status post CABG September 2017   COVID-19    Diabetes mellitus, type 2 ([place])    Erectile dysfunction    Essential hypertension    Hyperlipidemia    Palpitations    Cardiac monitor 03/2019: normal sinus rhythm, no AF, rare NSVT, VT   Past Surgical  History:  Procedure Laterality Date   AORTIC VALVE REPLACEMENT N/A 10/29/2015   Procedure: AORTIC VALVE REPLACEMENT (AVR) WITH 23MM MAGNA EASE TISSUE VALVE.;  Surgeon: [name], MD;  Location: [place];  Service: Open Heart Surgery;  Laterality: N/A;   CARDIAC CATHETERIZATION N/A 10/27/2015   Procedure: Left Heart Cath and Coronary Angiography;  Surgeon: [name], MD;  Location: [place] [facility];  Service: Cardiovascular;  Laterality: N/A;   COLONOSCOPY  09/02/2015   Dr.Pyrtle   CORONARY ARTERY BYPASS GRAFT N/A 10/29/2015   Procedure: CORONARY ARTERY BYPASS GRAFTING (CABG) x Four UTILIZING THE LEFT  INTERNAL MAMMARY ARTERY AND ENDOSCOPICALLY HARVESTED RIGHT SAPEHENEOUS VEINS.;  Surgeon: [name], MD;  Location: [place];  Service: Open Heart Surgery;  Laterality: N/A;   CYST REMOVAL HAND Right    ESOPHAGOGASTRODUODENOSCOPY N/A 12/02/2015   Procedure: ESOPHAGOGASTRODUODENOSCOPY (EGD);  Surgeon: [name], MD;  Location: [place];  Service: Gastroenterology;  Laterality: N/A;   REPLACEMENT ASCENDING AORTA N/A 10/29/2015   Procedure: REPLACEMENT OF ASCENDING AORTA USING 34MM X 30CM WOVEN DOUBLE VELOUR VASCULAR GRAFT.;  Surgeon: [name], MD;  Location: [place];  Service: Open Heart Surgery;  Laterality: N/A;   TEE WITHOUT CARDIOVERSION N/A 10/29/2015   Procedure: TRANSESOPHAGEAL ECHOCARDIOGRAM (TEE);  Surgeon: [name], MD;  Location: [place];  Service: Open Heart Surgery;  Laterality: N/A;   UVULECTOMY N/A 12/10/2015   Procedure: UVULECTOMY;  Surgeon: [name], MD;  Location[facility];  Service: ENT;  Laterality: N/A;   Family History  Problem Relation Age of Onset   Cancer Father        Throat cancer   Diabetes Sister    Colon cancer Neg Hx    Social History   Socioeconomic History   Marital status: Widowed    Spouse name: Not on file   Number of children: 5   Years of education: 10    Highest education level: 10th grade  Occupational History   Occupation: custodian    Comment: PT  Tobacco Use   Smoking status: Never   Smokeless tobacco: Never  Vaping Use   Vaping Use: Never used  Substance and Sexual Activity   Alcohol use: Yes    Comment: 1 x month   Drug use: No   Sexual activity: Yes  Other Topics Concern   Not on file  Social History Narrative   Widowed to [name] for 62 years; 2 daughters liver with him now   Works part time as Chiropodist   5 children; 1 son deceased   Never Smoked   Alcohol use- no   Caffeine use: occasional       Lost one child a few months ago    49 his sister, then son and then  nephew - all within 2 weeks apart   Son had a stroke at 69    Was planning to get married last month in Feb.    Social Determinants of Health   Financial Resource Strain: Low Risk    Difficulty of Paying Living Expenses: Not hard at all  Food Insecurity: No Food Insecurity   Worried About Charity fundraiser in the Last Year: Never true   Arboriculturist in the Last Year: Never true  Transportation Needs: No Transportation Needs   Lack of Transportation (Medical): No   Lack of Transportation (Non-Medical): No  Physical Activity: Sufficiently Active   Days of Exercise per Week: 5 days  Minutes of Exercise per Session: 30 min  Stress: No Stress Concern Present   Feeling of Stress : Not at all  Social Connections: Moderately Integrated   Frequency of Communication with Friends and Family: More than three times a week   Frequency of Social Gatherings with Friends and Family: More than three times a week   Attends Religious Services: More than 4 times per year   Active Member of [employer] or Organizations: Yes   Attends Archivist Meetings: More than 4 times per year   Marital Status: Widowed    Clinical Intake: Nutrition Risk Assessment:  Has the patient had any N/V/D within the last 2 months?  No  Does the patient have any non-healing wounds?  No  Has the patient had any unintentional weight loss or weight gain?  No   Diabetes:  Is the patient diabetic?  Yes  If diabetic, was a CBG obtained today?  Yes  Did the patient bring in their glucometer from home?  No  How often do you monitor your CBG's? Daily.   Financial Strains and Diabetes Management:  Are you having any financial strains with the device, your supplies or your medication? No .  Does the patient want to be seen by Chronic Care Management for management of their diabetes?  No  Would the patient like to be referred to a Nutritionist or for Diabetic Management?  No   Diabetic Exams:  Diabetic Eye Exam:  Completed Yes.  Diabetic Foot Exam: Completed Yes. Pt has been advised about the importance in completing this exam. Pt is scheduled for diabetic foot exam on Followed by PCP.   Pre-visit preparation completed: YesDiabetic? No  Interpreter Needed?: No Activities of Daily Living In your present state of health, do you have any difficulty performing the following activities: 02/18/2021 03/25/2020  Hearing? N N  Vision? N N  Difficulty concentrating or making decisions? N N  Walking or climbing stairs? N Y  Dressing or bathing? N N  Doing errands, shopping? N N  Preparing Food and eating ? N -  Using the Toilet? N -  In the past six months, have you accidently leaked urine? N -  Do you have problems with loss of bowel control? N -  Managing your Medications? N -  Managing your Finances? N -  Housekeeping or managing your Housekeeping? N -  Some recent data might be hidden    Patient Care Team: [name], NP as PCP - General (Family Medicine) [name], [name], MD as PCP - Cardiology (Cardiology) Alda Berthold, DO as Consulting Physician (Neurology) Berle Mull, MD as Consulting Physician (Family Medicine) Sharmon Revere as Physician Assistant (Cardiology)  Indicate any recent Medical Services you may have received from other than [facility] providers in the past year (date may be approximate).     Assessment:   This is a routine wellness examination for [place].  Hearing/Vision screen Hearing Screening - Comments:: No difficulty hearing Vision Screening - Comments:: Wears glasses. Followed by [facility].  Dietary issues and exercise activities discussed: Current Exercise Habits: Home exercise routine, Type of exercise: walking, Time (Minutes): 30, Frequency (Times/Week): 5, Weekly Exercise (Minutes/Week): 150, Intensity: Moderate   Goals Addressed             This Visit's Progress    Exercise 3x per week (30 min per time)         Depression Screen [facility] 2/9 Scores  02/18/2021 02/18/2020 11/13/2019 01/27/2019 04/24/2017 04/24/2017 05/19/2016  PHQ - 2 Score 0 0 0 0 0 0 0  PHQ- 9 Score - 0 - - - - -    Fall Risk Fall Risk  02/18/2021 01/18/2021 02/18/2020 11/13/2019 01/27/2019  Falls in the past year? 0 0 0 0 0  Number falls in past yr: 0 0 0 0 -  Injury with Fall? 0 - 0 0 -  Risk for fall due to : - - Medication side effect No Fall Risks -  Follow up - - Falls evaluation completed;Falls prevention discussed Falls evaluation completed -    FALL RISK PREVENTION PERTAINING TO THE HOME:  Any stairs in or around the home? Yes  If so, are there any without handrails? No  Home free of loose throw rugs in walkways, pet beds, electrical cords, etc? Yes  Adequate lighting in your home to reduce risk of falls? Yes   ASSISTIVE DEVICES UTILIZED TO PREVENT FALLS:  Life alert? No  Use of a cane, walker or w/c? No  Grab bars in the bathroom? No  Shower chair or bench in shower? Yes  Elevated toilet seat or a handicapped toilet? Yes   TIMED UP AND GO:  Was the test performed? Yes .  Length of time to ambulate 10 feet: 5 sec.   Gait steady and fast without use of assistive device  Cognitive Function: MMSE - Mini Mental State Exam 04/24/2017  Not completed: (No Data)     6CIT Screen 02/18/2021 01/27/2019  What Year? 0 points 0 points  What month? 0 points 0 points  What time? 0 points 0 points  Count back from 20 0 points 0 points  Months in reverse 2 points 0 points  Repeat phrase 2 points 0 points  Total Score 4 0    Immunizations Immunization History  Administered Date(s) Administered   Fluad Quad(high Dose 65+) 11/06/2019, 01/19/2021   Influenza Whole 03/15/2009, 04/06/2010   Influenza, High Dose Seasonal PF 12/29/2013, 12/25/2014, 04/24/2017, 01/16/2018   Influenza,inj,Quad PF,6+ Mos 11/12/2012, 11/25/2015, 01/18/2021   PFIZER(Purple Top)SARS-COV-2 Vaccination 07/08/2019   Pneumococcal Conjugate-13 04/24/2014   Pneumococcal Polysaccharide-23  12/13/2007, 11/25/2015   Td 09/13/2008    Covid-19 vaccine status: Completed vaccines  Qualifies for Shingles Vaccine? Yes   Zostavax completed No   Shingrix Completed?: No.    Education has been provided regarding the importance of this vaccine. Patient has been advised to call insurance company to determine out of pocket expense if they have not yet received this vaccine. Advised may also receive vaccine at local pharmacy or Health Dept. Verbalized acceptance and understanding.  Screening Tests Health Maintenance  Topic Date Due   COVID-19 Vaccine (2 - Pfizer risk series) 07/29/2019   URINE MICROALBUMIN  12/05/2019   OPHTHALMOLOGY EXAM  02/18/2021 (Originally 01/01/2020)   Zoster Vaccines- Shingrix (1 of 2) 05/19/2021 (Originally 07/01/1964)   HEMOGLOBIN A1C  07/19/2021   FOOT EXAM  01/18/2022   Pneumonia Vaccine 21+ Years old  Completed   INFLUENZA VACCINE  Completed   Hepatitis C Screening  Completed   HPV VACCINES  Aged Out   COLONOSCOPY (Pts 45-73yr Insurance coverage will need to be confirmed)  Discontinued   TETANUS/TDAP  Discontinued    Health Maintenance  Health Maintenance Due  Topic Date Due   COVID-19 Vaccine (2 - Pfizer risk series) 07/29/2019   URINE MICROALBUMIN  12/05/2019     Additional Screening:   Vision Screening: Recommended annual ophthalmology exams for early detection of glaucoma and other disorders of the eye.  Is the patient up to date with their annual eye exam?  Yes  Who is the provider or what is the name of the office in which the patient attends annual eye exams? Followed by [facility]   Dental Screening: Recommended annual dental exams for proper oral hygiene  Community Resource Referral / Chronic Care Management:  CRR required this visit?  No   CCM required this visit?  No      Plan:     I have personally reviewed and noted the following in the patients chart:   Medical and social history Use of alcohol, tobacco or illicit  drugs  Current medications and supplements including opioid prescriptions. Patient is not currently taking opioid prescriptions. Functional ability and status Nutritional status Physical activity Advanced directives List of other physicians Hospitalizations, surgeries, and ER visits in previous 12 months Vitals Screenings to include cognitive, depression, and falls Referrals and appointments  In addition, I have reviewed and discussed with patient certain preventive protocols, quality metrics, and best practice recommendations. A written personalized care plan for preventive services as well as general preventive health recommendations were provided to patient.     Criselda Peaches, LPN   7/0/1100

## 2021-03-09 DIAGNOSIS — E113312 Type 2 diabetes mellitus with moderate nonproliferative diabetic retinopathy with macular edema, left eye: Secondary | ICD-10-CM | POA: Diagnosis not present

## 2021-03-15 ENCOUNTER — Ambulatory Visit: Payer: Medicare PPO | Admitting: Adult Health

## 2021-03-15 VITALS — BP 138/80 | HR 77 | Temp 98.8°F | Ht 72.0 in | Wt 187.0 lb

## 2021-03-15 DIAGNOSIS — R310 Gross hematuria: Secondary | ICD-10-CM

## 2021-03-15 DIAGNOSIS — R300 Dysuria: Secondary | ICD-10-CM | POA: Diagnosis not present

## 2021-03-15 LAB — CBC WITH DIFFERENTIAL/PLATELET
Basophils Absolute: 0 10*3/uL (ref 0.0–0.1)
Basophils Relative: 0.4 % (ref 0.0–3.0)
Eosinophils Absolute: 0.1 10*3/uL (ref 0.0–0.7)
Eosinophils Relative: 1 % (ref 0.0–5.0)
HCT: 38.9 % — ABNORMAL LOW (ref 39.0–52.0)
Hemoglobin: 12.7 g/dL — ABNORMAL LOW (ref 13.0–17.0)
Lymphocytes Relative: 22.8 % (ref 12.0–46.0)
Lymphs Abs: 2.7 10*3/uL (ref 0.7–4.0)
MCHC: 32.7 g/dL (ref 30.0–36.0)
MCV: 90.1 fl (ref 78.0–100.0)
Monocytes Absolute: 1 10*3/uL (ref 0.1–1.0)
Monocytes Relative: 8.6 % (ref 3.0–12.0)
Neutro Abs: 8 10*3/uL — ABNORMAL HIGH (ref 1.4–7.7)
Neutrophils Relative %: 67.2 % (ref 43.0–77.0)
Platelets: 209 10*3/uL (ref 150.0–400.0)
RBC: 4.32 Mil/uL (ref 4.22–5.81)
RDW: 13.9 % (ref 11.5–15.5)
WBC: 12 10*3/uL — ABNORMAL HIGH (ref 4.0–10.5)

## 2021-03-15 LAB — BASIC METABOLIC PANEL
BUN: 14 mg/dL (ref 6–23)
CO2: 31 mEq/L (ref 19–32)
Calcium: 9.8 mg/dL (ref 8.4–10.5)
Chloride: 99 mEq/L (ref 96–112)
Creatinine, Ser: 1.33 mg/dL (ref 0.40–1.50)
GFR: 52.95 mL/min — ABNORMAL LOW (ref >60.00)
Glucose, Bld: 139 mg/dL — ABNORMAL HIGH (ref 70–99)
Potassium: 4.3 mEq/L (ref 3.5–5.1)
Sodium: 137 mEq/L (ref 135–145)

## 2021-03-15 LAB — POCT URINALYSIS DIPSTICK
Bilirubin, UA: NEGATIVE
Blood, UA: POSITIVE
Glucose, UA: NEGATIVE
Ketones, UA: NEGATIVE
Nitrite, UA: NEGATIVE
Protein, UA: POSITIVE — AB
Spec Grav, UA: 1.015 (ref 1.010–1.025)
Urobilinogen, UA: 1 E.U./dL
pH, UA: 6 (ref 5.0–8.0)

## 2021-03-15 NOTE — Progress Notes (Signed)
Subjective:    Patient ID: [name], male    DOB: [birth year], 76 y.o.   MRN: [id number]  HPI 76 year old male who  has a past medical history of Aortic stenosis, Blood transfusion without reported diagnosis, Carpal tunnel syndrome (09/24/2014), Coronary artery disease, COVID-19, Diabetes mellitus, type 2 ([place]), Erectile dysfunction, Essential hypertension, Hyperlipidemia, and Palpitations.  Presents to the office today for concern of recurrent urinary tract infection as well has "red urine".  Reports that 3 days ago when he urinated it was "blood red", since that time he has had burning, frequency, urgency, and at the end of the stream his urine is red."  He has some mild pelvic pain but no low back pain.  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   Bilateral   Coronary artery disease    Multivessel status post CABG September 2017   COVID-19    Diabetes mellitus, type 2 ([place])    Erectile dysfunction    Essential hypertension    Hyperlipidemia    Palpitations    Cardiac monitor 03/2019: normal sinus rhythm, no AF, rare NSVT, VT    Social History   Socioeconomic History   Marital status: Widowed    Spouse name: Not on file   Number of children: 5   Years of education: 10    Highest education level: 10th grade  Occupational History   Occupation: custodian    Comment: PT  Tobacco Use   Smoking status: Never   Smokeless tobacco: Never  Vaping Use   Vaping Use: Never used  Substance and Sexual Activity   Alcohol use: Yes    Comment: 1 x month   Drug use: No   Sexual activity: Yes  Other Topics Concern   Not on file  Social History Narrative   Widowed to [name] for 58 years; 2 daughters liver with him now   Works part time as Chiropodist    5 children; 1 son deceased   Never Smoked   Alcohol use- no   Caffeine use: occasional       Lost one child a few months ago    16 his sister, then son and then nephew - all within 2 weeks apart   Son had a stroke at 64    Was planning to get married last month in Feb.    Social Determinants of Health   Financial Resource Strain: Low Risk    Difficulty of Paying Living Expenses: Not hard at all  Food Insecurity: No Food Insecurity   Worried About Charity fundraiser in the Last Year: Never true   Arboriculturist in the Last Year: Never true  Transportation Needs: No Transportation Needs   Lack of Transportation (Medical): No   Lack of Transportation (Non-Medical): No  Physical Activity: Sufficiently Active   Days of Exercise per Week: 5 days   Minutes of Exercise per Session: 30 min  Stress: No Stress Concern Present   Feeling of Stress : Not at all  Social Connections: Moderately Integrated   Frequency of Communication with Friends and Family: More than three times a week   Frequency of Social Gatherings with Friends and Family: More than three times a week  Attends Religious Services: More than 4 times per year   Active Member of Clubs or Organizations: Yes   Attends Archivist Meetings: More than 4 times per year   Marital Status: Widowed  Intimate Partner Violence: Not At Risk   Fear of Current or Ex-Partner: No   Emotionally Abused: No   Physically Abused: No   Sexually Abused: No    Past Surgical History:  Procedure Laterality Date   AORTIC VALVE REPLACEMENT N/A 10/29/2015   Procedure: AORTIC VALVE REPLACEMENT (AVR) WITH 23MM MAGNA EASE TISSUE VALVE.;  Surgeon: [name], MD;  Location: [place];  Service: Open Heart Surgery;  Laterality: N/A;   CARDIAC CATHETERIZATION N/A 10/27/2015   Procedure: Left Heart Cath and Coronary Angiography;  Surgeon: [name], MD;  Location: [place] [facility];  Service: Cardiovascular;  Laterality: N/A;    COLONOSCOPY  09/02/2015   Dr.Pyrtle   CORONARY ARTERY BYPASS GRAFT N/A 10/29/2015   Procedure: CORONARY ARTERY BYPASS GRAFTING (CABG) x Four UTILIZING THE LEFT INTERNAL MAMMARY ARTERY AND ENDOSCOPICALLY HARVESTED RIGHT SAPEHENEOUS VEINS.;  Surgeon: [name], MD;  Location: [place];  Service: Open Heart Surgery;  Laterality: N/A;   CYST REMOVAL HAND Right    ESOPHAGOGASTRODUODENOSCOPY N/A 12/02/2015   Procedure: ESOPHAGOGASTRODUODENOSCOPY (EGD);  Surgeon: [name], MD;  Location: [place];  Service: Gastroenterology;  Laterality: N/A;   REPLACEMENT ASCENDING AORTA N/A 10/29/2015   Procedure: REPLACEMENT OF ASCENDING AORTA USING 34MM X 30CM WOVEN DOUBLE VELOUR VASCULAR GRAFT.;  Surgeon: [name], MD;  Location: [place];  Service: Open Heart Surgery;  Laterality: N/A;   TEE WITHOUT CARDIOVERSION N/A 10/29/2015   Procedure: TRANSESOPHAGEAL ECHOCARDIOGRAM (TEE);  Surgeon: [name], MD;  Location: [place];  Service: Open Heart Surgery;  Laterality: N/A;   UVULECTOMY N/A 12/10/2015   Procedure: UVULECTOMY;  Surgeon: [name], MD;  Location: [facility];  Service: ENT;  Laterality: N/A;    Family History  Problem Relation Age of Onset   Cancer Father        Throat cancer   Diabetes Sister    Colon cancer Neg Hx     Allergies  Allergen Reactions   Amiodarone Nausea And Vomiting   Quinolones     Patient was warned about not using Cipro and similar antibiotics. Recent studies have raised concern that fluoroquinolone antibiotics could be associated with an increased risk of aortic aneurysm Fluoroquinolones have non-antimicrobial properties that might jeopardise the integrity of the extracellular matrix of the vascular wall In a  propensity score matched cohort study in [place], there was a 66% increased rate of aortic aneurysm or dissection associated with oral fluoroquinolone use, compared wit    Current Outpatient Medications on File Prior to Visit   Medication Sig Dispense Refill   Accu-Chek Softclix Lancets lancets Use as instructed 100 each 12   aspirin EC 81 MG tablet Take 81 mg by mouth daily.     Blood Glucose Monitoring Suppl (ACCU-CHEK GUIDE ME) w/Device KIT 1 Stick by Does not apply route 3 (three) times daily. Used to check blood sugar 3 times daily 1 kit 0   clobetasol cream (TEMOVATE) 5.72 % Apply 1 application topically 2 (two) times daily. 60 g 1   glipiZIDE (GLUCOTROL XL) 10 MG 24 hr tablet TAKE 1 TABLET (10 MG TOTAL) BY MOUTH IN THE MORNING AND AT BEDTIME. 180 tablet 0   glucose blood (ACCU-CHEK GUIDE) test strip Use as instructed 100 each 1  hydrochlorothiazide (HYDRODIURIL) 25 MG tablet Take 1 tablet (25 mg total) by mouth daily. 90 tablet 3   hydrOXYzine (ATARAX/VISTARIL) 50 MG tablet Take 1 tablet (50 mg total) by mouth 3 (three) times daily as needed for itching. 60 tablet 0   insulin glargine (LANTUS SOLOSTAR) 100 UNIT/ML Solostar Pen Inject 22 Units into the skin 2 (two) times daily. 40 mL 0   Insulin Pen Needle (B-D ULTRAFINE III SHORT PEN) 31G X 8 MM MISC Use BID 200 each 3   metFORMIN (GLUCOPHAGE) 1000 MG tablet TAKE 1 TABLET (1,000 MG TOTAL) BY MOUTH 2 (TWO) TIMES DAILY WITH A MEAL. 180 tablet 0   metoprolol tartrate (LOPRESSOR) 25 MG tablet TAKE 1 TABLET BY MOUTH TWICE A DAY 180 tablet 1   ondansetron (ZOFRAN) 4 MG tablet Take 1 tablet (4 mg total) by mouth every 6 (six) hours. 12 tablet 0   rosuvastatin (CRESTOR) 40 MG tablet TAKE 1 TABLET BY MOUTH EVERYDAY AT BEDTIME 90 tablet 1   No current facility-administered medications on file prior to visit.    BP 138/80    Pulse 77    Temp 98.8 F (37.1 C) (Oral)    Ht 6' (1.829 m)    Wt 187 lb (84.8 kg)    SpO2 97%    BMI 25.36 kg/m       Objective:   Physical Exam Vitals and nursing note reviewed.  Constitutional:      Appearance: Normal appearance.  Cardiovascular:     Rate and Rhythm: Normal rate and regular rhythm.     Pulses: Normal pulses.     Heart  sounds: Normal heart sounds.  Pulmonary:     Effort: Pulmonary effort is normal.     Breath sounds: Normal breath sounds.  Abdominal:     General: Abdomen is flat. Bowel sounds are normal.     Palpations: Abdomen is soft.     Tenderness: There is no right CVA tenderness or left CVA tenderness.  Skin:    General: Skin is warm and dry.  Neurological:     General: No focal deficit present.     Mental Status: He is alert and oriented to person, place, and time.  Psychiatric:        Mood and Affect: Mood normal.        Behavior: Behavior normal.        Thought Content: Thought content normal.        Judgment: Judgment normal.      Assessment & Plan:  1. UTI -He is resistant to a lot of antibiotics.  We will send culture and then treat. - POC Urinalysis Dipstick - Culture, Urine - CT ABDOMEN PELVIS W WO CONTRAST; Future - Ambulatory referral to Urology  2. Gross hematuria  - CT ABDOMEN PELVIS W WO CONTRAST; Future - Ambulatory referral to Urology - Basic Metabolic Panel; Future - CBC with Differential/Platelet; Future - CBC with Differential/Platelet - Basic Metabolic Panel  [name], NP

## 2021-03-16 ENCOUNTER — Ambulatory Visit: Payer: Medicare PPO | Admitting: Adult Health

## 2021-03-18 ENCOUNTER — Other Ambulatory Visit: Payer: Self-pay | Admitting: Adult Health

## 2021-03-18 LAB — URINE CULTURE
MICRO NUMBER:: 12943477
SPECIMEN QUALITY:: ADEQUATE

## 2021-03-18 MED ORDER — AMOXICILLIN-POT CLAVULANATE 875-125 MG PO TABS: 1.0000 | ORAL_TABLET | Freq: Two times a day (BID) | ORAL | 0 refills | Status: DC

## 2021-03-21 ENCOUNTER — Encounter: Payer: Self-pay | Admitting: Urology

## 2021-03-21 ENCOUNTER — Other Ambulatory Visit: Payer: Self-pay

## 2021-03-21 ENCOUNTER — Ambulatory Visit (INDEPENDENT_AMBULATORY_CARE_PROVIDER_SITE_OTHER): Payer: Medicare PPO | Admitting: Urology

## 2021-03-21 VITALS — BP 176/73 | HR 77 | Ht 72.0 in | Wt 180.0 lb

## 2021-03-21 DIAGNOSIS — N390 Urinary tract infection, site not specified: Secondary | ICD-10-CM

## 2021-03-21 DIAGNOSIS — R310 Gross hematuria: Secondary | ICD-10-CM

## 2021-03-21 NOTE — Progress Notes (Signed)
03/21/2021 9:44 PM   ALP GOLDWATER 01-Feb-1946 [id number]  Referring provider: [name], NP [place],  [place] [address]  Chief Complaint  Patient presents with   Hematuria    HPI: [name] is a 76 y.o. male referred for evaluation of gross hematuria.  Saw PCP 03/15/2021 for gross hematuria which occurred 3 days prior to that visit.  Urine described as dark burgundy with LUTS and associated with frequency, urgency and dysuria No fever, chills The last blood he saw was yesterday which was a small amount UA was nitrite positive and large leukocytes on dipstick Urine culture grew pansensitive E. 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   Blood transfusion without reported diagnosis    Carpal tunnel syndrome 09/24/2014   Bilateral   Coronary artery disease    Multivessel status post CABG September 2017   COVID-19    Diabetes mellitus, type 2 ([place])    Erectile dysfunction    Essential hypertension    Hyperlipidemia    Palpitations    Cardiac monitor 03/2019: normal sinus rhythm, no AF, rare NSVT, VT    Surgical History: Past Surgical History:  Procedure Laterality Date   AORTIC VALVE REPLACEMENT N/A 10/29/2015   Procedure: AORTIC VALVE REPLACEMENT (AVR) WITH 23MM MAGNA EASE TISSUE VALVE.;  Surgeon: [name], MD;  Location: [facility];  Service: Open Heart Surgery;  Laterality: N/A;   CARDIAC CATHETERIZATION N/A 10/27/2015   Procedure: Left Heart Cath and Coronary Angiography;  Surgeon: [name], MD;  Location: [facility];  Service: Cardiovascular;  Laterality: N/A;   COLONOSCOPY  09/02/2015   Dr.Pyrtle   CORONARY ARTERY BYPASS GRAFT N/A  10/29/2015   Procedure: CORONARY ARTERY BYPASS GRAFTING (CABG) x Four UTILIZING THE LEFT INTERNAL MAMMARY ARTERY AND ENDOSCOPICALLY HARVESTED RIGHT SAPEHENEOUS VEINS.;  Surgeon: [name], MD;  Location: [facility];  Service: Open Heart Surgery;  Laterality: N/A;   CYST REMOVAL HAND Right    ESOPHAGOGASTRODUODENOSCOPY N/A 12/02/2015   Procedure: ESOPHAGOGASTRODUODENOSCOPY (EGD);  Surgeon: [name], MD;  Location: [facility];  Service: Gastroenterology;  Laterality: N/A;   REPLACEMENT ASCENDING AORTA N/A 10/29/2015   Procedure: REPLACEMENT OF ASCENDING AORTA USING 34MM X 30CM WOVEN DOUBLE VELOUR VASCULAR GRAFT.;  Surgeon: [name], MD;  Location: [facility];  Service: Open Heart Surgery;  Laterality: N/A;   TEE WITHOUT CARDIOVERSION N/A 10/29/2015   Procedure: TRANSESOPHAGEAL ECHOCARDIOGRAM (TEE);  Surgeon: [name], MD;  Location: [facility];  Service: Open Heart Surgery;  Laterality: N/A;   UVULECTOMY N/A 12/10/2015   Procedure: UVULECTOMY;  Surgeon: [name], MD;  Location: [facility];  Service: ENT;  Laterality: N/A;    Home Medications:  Allergies as of 03/21/2021       Reactions   Amiodarone Nausea And Vomiting   Quinolones    Patient was warned about not using Cipro and similar antibiotics. Recent studies have raised concern that fluoroquinolone antibiotics could be associated with an increased risk of aortic aneurysm Fluoroquinolones have non-antimicrobial properties that might jeopardise the integrity of the extracellular matrix of the vascular wall In a  propensity score matched cohort study in [place], there was a 66% increased  rate of aortic aneurysm or dissection associated with oral fluoroquinolone use, compared wit        Medication List        Accurate as of March 21, 2021  9:44 PM. If you have any questions, ask your nurse or doctor.          Accu-Chek Guide Me w/Device Kit 1 Stick by Does not apply route 3 (three) times daily. Used to  check blood sugar 3 times daily   Accu-Chek Guide test strip Generic drug: glucose blood Use as instructed   Accu-Chek Softclix Lancets lancets Use as instructed   amoxicillin-clavulanate 875-125 MG tablet Commonly known as: AUGMENTIN Take 1 tablet by mouth 2 (two) times daily.   aspirin EC 81 MG tablet Take 81 mg by mouth daily.   B-D ULTRAFINE III SHORT PEN 31G X 8 MM Misc Generic drug: Insulin Pen Needle Use BID   clobetasol cream 0.05 % Commonly known as: TEMOVATE Apply 1 application topically 2 (two) times daily.   glipiZIDE 10 MG 24 hr tablet Commonly known as: GLUCOTROL XL TAKE 1 TABLET (10 MG TOTAL) BY MOUTH IN THE MORNING AND AT BEDTIME.   hydrochlorothiazide 25 MG tablet Commonly known as: HYDRODIURIL Take 1 tablet (25 mg total) by mouth daily.   hydrOXYzine 50 MG tablet Commonly known as: ATARAX Take 1 tablet (50 mg total) by mouth 3 (three) times daily as needed for itching.   Lantus SoloStar 100 UNIT/ML Solostar Pen Generic drug: insulin glargine Inject 22 Units into the skin 2 (two) times daily.   metFORMIN 1000 MG tablet Commonly known as: GLUCOPHAGE TAKE 1 TABLET (1,000 MG TOTAL) BY MOUTH 2 (TWO) TIMES DAILY WITH A MEAL.   metoprolol tartrate 25 MG tablet Commonly known as: LOPRESSOR TAKE 1 TABLET BY MOUTH TWICE A DAY   ondansetron 4 MG tablet Commonly known as: ZOFRAN Take 1 tablet (4 mg total) by mouth every 6 (six) hours.   rosuvastatin 40 MG tablet Commonly known as: CRESTOR TAKE 1 TABLET BY MOUTH EVERYDAY AT BEDTIME        Allergies:  Allergies  Allergen Reactions   Amiodarone Nausea And Vomiting   Quinolones     Patient was warned about not using Cipro and similar antibiotics. Recent studies have raised concern that fluoroquinolone antibiotics could be associated with an increased risk of aortic aneurysm Fluoroquinolones have non-antimicrobial properties that might jeopardise the integrity of the extracellular matrix of the  vascular wall In a  propensity score matched cohort study in [place], there was a 66% increased rate of aortic aneurysm or dissection associated with oral fluoroquinolone use, compared wit    Family History: Family History  Problem Relation Age of Onset   Cancer Father        Throat cancer   Diabetes Sister    Colon cancer Neg Hx     Social History:  reports that he has never smoked. He has never used smokeless tobacco. He reports current alcohol use. He reports that he does not use drugs.   Physical Exam: BP (!) 176/73    Pulse 77    Ht 6' (1.829 m)    Wt 180 lb (81.6 kg)    BMI 24.41 kg/m   Constitutional:  Alert and oriented, No acute distress. HEENT: [place] AT, moist mucus membranes.  Trachea midline, no masses. Cardiovascular: No clubbing, cyanosis, or edema. Respiratory: Normal respiratory effort, no increased work of breathing. Skin: No rashes, bruises or suspicious lesions. Neurologic: Grossly intact, no  focal deficits, moving all 4 extremities. Psychiatric: Normal mood and affect.  Laboratory Data:  Urinalysis Appearance-yellow, cloudy Dipstick trace leukocytes/2+ protein Microscopy 11-30 WBC   Assessment & Plan:    1. Gross hematuria Most likely secondary to UTI CT urogram has been ordered but not yet scheduled Schedule cystoscopy.  The procedure was discussed in detail  2.  Recurrent UTI Most likely source is prostatitis Evaluation as above   Abbie Sons, MD  [facility] [address], [place], [place] [address] [phone]

## 2021-03-22 LAB — MICROSCOPIC EXAMINATION: Bacteria, UA: NONE SEEN

## 2021-03-22 LAB — URINALYSIS, COMPLETE
Bilirubin, UA: NEGATIVE
Glucose, UA: NEGATIVE
Ketones, UA: NEGATIVE
Nitrite, UA: NEGATIVE
RBC, UA: NEGATIVE
Specific Gravity, UA: 1.02 (ref 1.005–1.030)
Urobilinogen, Ur: 1 mg/dL (ref 0.2–1.0)
pH, UA: 5.5 (ref 5.0–7.5)

## 2021-03-23 ENCOUNTER — Encounter: Payer: Self-pay | Admitting: Urology

## 2021-03-25 ENCOUNTER — Telehealth: Payer: Self-pay | Admitting: Adult Health

## 2021-03-25 ENCOUNTER — Other Ambulatory Visit: Payer: Self-pay | Admitting: Surgery

## 2021-03-25 DIAGNOSIS — I7121 Aneurysm of the ascending aorta, without rupture: Secondary | ICD-10-CM

## 2021-03-25 NOTE — Telephone Encounter (Signed)
Pt is calling and would like someone to go back over his results. Pt saw the results on mychart

## 2021-03-25 NOTE — Telephone Encounter (Signed)
Called pt back no answer  

## 2021-03-28 NOTE — Telephone Encounter (Signed)
Pt stated that he found out how to get to his lab results and remembered that I called him already. No further action needed!

## 2021-03-31 ENCOUNTER — Encounter: Payer: Self-pay | Admitting: Cardiovascular Disease

## 2021-03-31 NOTE — Progress Notes (Signed)
[name] Date of Birth  Jul 18, 1945     [address]    [address]    [place], [place]  [address]        Problems: 1. Chest pain 2. CAD - s/p CABG  - Sept. 2017  3. Aortic stenosis - s/p AVR Sept. 2017.  4. Hypercholesterolemia 5. Diabetes mellitus 6. Mild dilatation of the ascending aorta by CT scan-4.4 cm    Previous notes.:    [name] is a 76 year old gentleman who presents today for further evaluation of his chest pain. These chest pains are described as a left-sided pain. He seemed to come on with rest and exertion.  The pains last for hours.    He has been quite regularly.  He does not do any regular exercise.  He works as a Sports coach.  He had a stress Myoview study which was normal. His echocardiogram revealed  Left ventricle: The cavity size was normal. Wall thickness was increased in a pattern of mild LVH. Systolic function was normal. The estimated ejection fraction was in the range of 55% to 60%. 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 Mean gradient: 22m Hg (S). Peak gradient: 371mHg (S). - Atrial septum: No defect or patent foramen ovale was identified.  He still has some occasional episodes of chest pain that he describes as a "pins and needles" like sensation.  He's able to get out and exercise without any problems.  Dec. 11, 2015:  [name] a 89o who I follow for episodes of chest pain, mild aortic stenosis, mild aortic dilitation He is seen back after a 2 year absence.   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   No CP or dyspnea.   Walks occasionally ,  A recent echo showed mild AS and trace MR.  He has mild aortic dilitation.  July 23 2015:  [name] seen back after a 2 year absence . He's continued to have some episodes of chest pain. We performed a stress Myoview study on him in 2013 which was normal. These episodes  of pain are not associated with eating, drinking, exercise, or change of position. The pains are exacerbated by leaning forward. He's not tried any specific medications. 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  He notes some shortness of breath with exertion when he is climbing stairs. This seems to be stable.  Dec. 7, 2017:  [name] seen back  Had CABG ( 4 vessel)  + AVR (23MM MAGNA EASE TISSUE VALVE. (N/A) on Sept.  left internal mammary to     the left anterior descending coronary artery, sequential reverse SVG  to the first and second obtuse marginal,     SVG to the distal right coronary artery  He has had multiple hospitalizations (5)  since that time for nausea and vomiting. Eventually his amiodarone was discontinued.  This seems to have helped   Hospitalization was complicated by trauma to the [place] - required surgical removal .   He was told by someone to stop his Xarelto and start an aspirin.  Took Xarelto for a month.   April 24, 2016:  [name] seen today .  No CP or dyspnea.  Healing up from his CABG in Sept. 2017.  Oct. 2, 2018:    [name] is seen for follow-up of his coronary artery disease, hyperlipidemia, hypertension and aortic valve placement. He still has dilatation of his ascending aorta.  has some chest soreness at times , when he picks up something a bit heavy .  Not angina ,   A soreness.  Getting some regular exercise  BP is slightly elevated  Still eating some salty foods , canned green beans  No syncope or presyncope. He denies any chest pain or shortness of breath.  Jul 13, 2017: Seen for follow up of his CAD and AVR in Sept 2017.    Has had some chest wall tenderness.   Has been present for several months   Also has some left breast swelling that has been present for several weeks.   Associated with itching .   Has not discussed with primrary MD    Breathing has been good .   Some DOE with climbing steps .  Has not been exercising   Feb. 1, 2022 [name] is seen today for follow up of his CAD, AVR in Sept, 2017,  BP is mildly elevated.  He was last seen 3 years ago  Doing well.  Doing custodial work at [employer]  No CP , no dyspnea    Feb. 17, 2023 [name] is seen today for follow up of his CAD, AVR Needs to have a urologic procedure   He will be at low risk for his urology procedure  He may hold his ASA for 5-7 days prior to the procedure if needed.    Current Outpatient Medications on File Prior to Visit  Medication Sig Dispense Refill   Accu-Chek Softclix Lancets lancets Use as instructed 100 each 12   aspirin EC 81 MG tablet Take 81 mg by mouth daily.     Blood Glucose Monitoring Suppl (ACCU-CHEK GUIDE ME) w/Device KIT 1 Stick by Does not apply route 3 (three) times daily. Used to check blood sugar 3 times daily 1 kit 0   glipiZIDE (GLUCOTROL XL) 10 MG 24 hr tablet TAKE 1 TABLET (10 MG TOTAL) BY MOUTH IN THE MORNING AND AT BEDTIME. 180 tablet 0   glucose blood (ACCU-CHEK GUIDE) test strip Use as instructed 100 each 1   hydrochlorothiazide (HYDRODIURIL) 25 MG tablet Take 1 tablet (25 mg total) by mouth daily. 90 tablet 3   hydrOXYzine (ATARAX/VISTARIL) 50 MG tablet Take 1 tablet (50 mg total) by mouth 3 (three) times daily as needed for itching. 60 tablet 0   insulin glargine (LANTUS SOLOSTAR) 100 UNIT/ML Solostar Pen Inject 22 Units into the skin 2 (two) times daily. 40 mL 0   Insulin Pen Needle (B-D ULTRAFINE III SHORT PEN) 31G X 8 MM MISC Use BID 200 each 3   metFORMIN (GLUCOPHAGE) 1000 MG tablet TAKE 1 TABLET (1,000 MG TOTAL) BY MOUTH 2 (TWO) TIMES DAILY WITH A MEAL. 180 tablet 0   metoprolol tartrate (LOPRESSOR) 25 MG tablet TAKE 1 TABLET BY MOUTH TWICE A DAY 180 tablet 1   ondansetron (ZOFRAN) 4 MG tablet Take 1 tablet (4 mg total) by mouth every 6 (six) hours. 12 tablet 0   rosuvastatin (CRESTOR)  40 MG tablet TAKE 1 TABLET BY MOUTH EVERYDAY AT BEDTIME 90 tablet 1   No current facility-administered medications on file prior to visit.    Allergies  Allergen Reactions   Amiodarone Nausea And Vomiting   Quinolones  Patient was warned about not using Cipro and similar antibiotics. 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   Blood transfusion without reported diagnosis    Carpal tunnel syndrome 09/24/2014   Bilateral   Coronary artery disease    Multivessel status post CABG September 2017   COVID-19    Diabetes mellitus, type 2 ([place])    Erectile dysfunction    Essential hypertension    Hyperlipidemia    Palpitations    Cardiac monitor 03/2019: normal sinus rhythm, no AF, rare NSVT, VT    Past Surgical History:  Procedure Laterality Date   AORTIC VALVE REPLACEMENT N/A 10/29/2015   Procedure: AORTIC VALVE REPLACEMENT (AVR) WITH 23MM MAGNA EASE TISSUE VALVE.;  Surgeon: [name], MD;  Location: [facility];  Service: Open Heart Surgery;  Laterality: N/A;   CARDIAC CATHETERIZATION N/A 10/27/2015   Procedure: Left Heart Cath and Coronary Angiography;  Surgeon: [name], MD;  Location: [facility];  Service: Cardiovascular;  Laterality: N/A;   COLONOSCOPY  09/02/2015   Dr.Pyrtle   CORONARY ARTERY BYPASS GRAFT N/A 10/29/2015   Procedure: CORONARY ARTERY BYPASS GRAFTING (CABG) x Four UTILIZING THE LEFT INTERNAL MAMMARY ARTERY AND ENDOSCOPICALLY HARVESTED RIGHT  SAPEHENEOUS VEINS.;  Surgeon: [name], MD;  Location: [facility];  Service: Open Heart Surgery;  Laterality: N/A;   CYST REMOVAL HAND Right    ESOPHAGOGASTRODUODENOSCOPY N/A 12/02/2015   Procedure: ESOPHAGOGASTRODUODENOSCOPY (EGD);  Surgeon: [name], MD;  Location: [facility];  Service: Gastroenterology;  Laterality: N/A;   REPLACEMENT ASCENDING AORTA N/A 10/29/2015   Procedure: REPLACEMENT OF ASCENDING AORTA USING 34MM X 30CM WOVEN DOUBLE VELOUR VASCULAR GRAFT.;  Surgeon: [name], MD;  Location: [facility];  Service: Open Heart Surgery;  Laterality: N/A;   TEE WITHOUT CARDIOVERSION N/A 10/29/2015   Procedure: TRANSESOPHAGEAL ECHOCARDIOGRAM (TEE);  Surgeon: [name], MD;  Location: [facility];  Service: Open Heart Surgery;  Laterality: N/A;   UVULECTOMY N/A 12/10/2015   Procedure: UVULECTOMY;  Surgeon: [name], MD;  Location: [facility];  Service: ENT;  Laterality: N/A;    Social History   Tobacco Use  Smoking Status Never  Smokeless Tobacco Never    Social History   Substance and Sexual Activity  Alcohol Use Yes   Comment: 1 x month    Family History  Problem Relation Age of Onset   Cancer Father        Throat cancer   Diabetes Sister    Colon cancer Neg Hx     Physical Exam: Blood pressure 124/70, pulse 72, height 6' (1.829 m), weight 186 lb 6.4 oz (84.6 kg), SpO2 98 %.  GEN:  Well nourished, well developed in no acute distress HEENT: Normal NECK: No JVD; No carotid bruits LYMPHATICS: No lymphadenopathy CARDIAC: RRR prom. S2,  soft systolic murmur  RESPIRATORY:  Clear to auscultation without rales, wheezing or rhonchi  ABDOMEN: Soft, non-tender, non-distended MUSCULOSKELETAL:  No edema; No deformity  SKIN: Warm and dry NEUROLOGIC:  Alert and oriented x 3  ECG:   April 01, 2021: Normal sinus rhythm.  Possible old anterior wall myocardial infarction.  No ST or T wave changes.  Assessment / Plan:   1. Bicupsid AV:   Status  post aortic valve replacement.  He seems to be doing great.   2.  Ascending aortic aneurism:   follows with Dr. Cyndia Bent    3. CAD -he is not having any episodes of angina.  4. Paroxysmal atrial fib:     No recurrent episodes of atrial fibrillation.  5. Hyperlipidemia:     Check labs today.  6. HTN:      BP is well controlled      Mertie Moores, MD  04/01/2021 3:31 PM    [address],  [place], [place]  [address] Pager [phone] Phone: [phone]; Fax: [phone]

## 2021-04-01 ENCOUNTER — Ambulatory Visit: Payer: Medicare PPO | Admitting: Cardiovascular Disease

## 2021-04-01 ENCOUNTER — Encounter: Payer: Self-pay | Admitting: Cardiovascular Disease

## 2021-04-01 ENCOUNTER — Other Ambulatory Visit: Payer: Self-pay

## 2021-04-01 VITALS — BP 124/70 | HR 72 | Ht 72.0 in | Wt 186.4 lb

## 2021-04-01 DIAGNOSIS — I350 Nonrheumatic aortic (valve) stenosis: Secondary | ICD-10-CM | POA: Diagnosis not present

## 2021-04-01 DIAGNOSIS — I25119 Atherosclerotic heart disease of native coronary artery with unspecified angina pectoris: Secondary | ICD-10-CM | POA: Diagnosis not present

## 2021-04-01 DIAGNOSIS — I351 Nonrheumatic aortic (valve) insufficiency: Secondary | ICD-10-CM | POA: Insufficient documentation

## 2021-04-01 DIAGNOSIS — I25810 Atherosclerosis of coronary artery bypass graft(s) without angina pectoris: Secondary | ICD-10-CM | POA: Diagnosis not present

## 2021-04-01 NOTE — Patient Instructions (Signed)
Medication Instructions:  Your physician recommends that you continue on your current medications as directed. Please refer to the Current Medication list given to you today.  *If you need a refill on your cardiac medications before your next appointment, please call your pharmacy*   Lab Work: TODAY: Lipids, ALT, BMP If you have labs (blood work) drawn today and your tests are completely normal, you will receive your results only by: [place] (if you have MyChart) OR A paper copy in the mail If you have any lab test that is abnormal or we need to change your treatment, we will call you to review the results.   Testing/Procedures: NONE   Follow-Up: At [facility], you and your health needs are our priority.  As part of our continuing mission to provide you with exceptional heart care, we have created designated Provider Care Teams.  These Care Teams include your primary Cardiologist (physician) and Advanced Practice Providers (APPs -  Physician Assistants and Nurse Practitioners) who all work together to provide you with the care you need, when you need it.  We recommend signing up for the patient portal called "MyChart".  Sign up information is provided on this After Visit Summary.  MyChart is used to connect with patients for Virtual Visits (Telemedicine).  Patients are able to view lab/test results, encounter notes, upcoming appointments, etc.  Non-urgent messages can be sent to your provider as well.   To learn more about what you can do with MyChart, go to [email or web address].    Your next appointment:   1 year(s)  The format for your next appointment:   In Person  Provider:   Mertie Moores, MD, or PA or NP

## 2021-04-02 LAB — BASIC METABOLIC PANEL
BUN/Creatinine Ratio: 16 (ref 10–24)
BUN: 17 mg/dL (ref 8–27)
CO2: 25 mmol/L (ref 20–29)
Calcium: 9.3 mg/dL (ref 8.6–10.2)
Chloride: 95 mmol/L — ABNORMAL LOW (ref 96–106)
Creatinine, Ser: 1.09 mg/dL (ref 0.76–1.27)
Glucose: 186 mg/dL — ABNORMAL HIGH (ref 70–99)
Potassium: 4 mmol/L (ref 3.5–5.2)
Sodium: 135 mmol/L (ref 134–144)
eGFR: 72 mL/min/{1.73_m2} (ref >59)

## 2021-04-02 LAB — LIPID PANEL
Chol/HDL Ratio: 2.4 ratio (ref 0.0–5.0)
Cholesterol, Total: 214 mg/dL — ABNORMAL HIGH (ref 100–199)
HDL: 91 mg/dL (ref >39)
LDL Chol Calc (NIH): 108 mg/dL — ABNORMAL HIGH (ref 0–99)
Triglycerides: 87 mg/dL (ref 0–149)
VLDL Cholesterol Cal: 15 mg/dL (ref 5–40)

## 2021-04-02 LAB — ALT: ALT: 18 IU/L (ref 0–44)

## 2021-04-06 ENCOUNTER — Telehealth: Payer: Self-pay | Admitting: Adult Health

## 2021-04-06 ENCOUNTER — Telehealth: Payer: Self-pay | Admitting: *Deleted

## 2021-04-06 DIAGNOSIS — E782 Mixed hyperlipidemia: Secondary | ICD-10-CM

## 2021-04-06 DIAGNOSIS — R310 Gross hematuria: Secondary | ICD-10-CM

## 2021-04-06 NOTE — Telephone Encounter (Signed)
The CT that was ordered by primary provider has not been scheduled.  I put in a new order.  It will need to be scheduled before his cystoscopy on 04/28/2021

## 2021-04-06 NOTE — Addendum Note (Signed)
Addended by: Abbie Sons on: 04/06/2021 04:15 PM   Modules accepted: Orders

## 2021-04-06 NOTE — Telephone Encounter (Signed)
Patient called in stating that he is having blood in urine again.  Patient could be contacted at [phone]/  Please advise.

## 2021-04-06 NOTE — Telephone Encounter (Signed)
Spoke to pt and he stated that his back and abdominal pain has gotten worse. Pt stated that he has not received a call for his CT scan. Pt advised that we will try to find out what the issue is. Looks like urology has placed a new order for CT. Tommi Rumps advised verbally if update and will look into this issue.

## 2021-04-06 NOTE — Telephone Encounter (Signed)
Patient daughter called about his CT urogram

## 2021-04-06 NOTE — Telephone Encounter (Signed)
Patient notified of update  and verbalized understanding. 

## 2021-04-07 NOTE — Telephone Encounter (Signed)
Called pt who verified that he has been taking his Crestor 40mg  daily. Pt agrees to be seen by lipid clinic here, referral placed

## 2021-04-07 NOTE — Telephone Encounter (Signed)
-----   Message from Thayer Headings, MD sent at 04/02/2021  8:53 AM EST ----- LDL is 108 .  His goal is 50-70. He may not have been as compliant with his rosuvastatin as he should have been If he has been taking his rosuvastatin regularly, it does not appear to be strong enough and we need to refer him to the lipid clinic to consider PCSK9i or Inclisiran. He has not been taking his rosuvastatin regularly, please have him restart and recheck lipids and ALT in 6 months

## 2021-04-13 NOTE — Telephone Encounter (Signed)
FYI Pt is scheduled for CT 04/22/2021 ?

## 2021-04-19 ENCOUNTER — Ambulatory Visit (INDEPENDENT_AMBULATORY_CARE_PROVIDER_SITE_OTHER): Payer: Medicare PPO | Admitting: Adult Health

## 2021-04-19 ENCOUNTER — Encounter: Payer: Self-pay | Admitting: Adult Health

## 2021-04-19 VITALS — BP 118/60 | HR 70 | Temp 99.0°F | Ht 70.5 in | Wt 181.0 lb

## 2021-04-19 DIAGNOSIS — Z0000 Encounter for general adult medical examination without abnormal findings: Secondary | ICD-10-CM | POA: Diagnosis not present

## 2021-04-19 DIAGNOSIS — I25810 Atherosclerosis of coronary artery bypass graft(s) without angina pectoris: Secondary | ICD-10-CM

## 2021-04-19 DIAGNOSIS — E1169 Type 2 diabetes mellitus with other specified complication: Secondary | ICD-10-CM

## 2021-04-19 DIAGNOSIS — Z125 Encounter for screening for malignant neoplasm of prostate: Secondary | ICD-10-CM | POA: Diagnosis not present

## 2021-04-19 DIAGNOSIS — Z794 Long term (current) use of insulin: Secondary | ICD-10-CM | POA: Diagnosis not present

## 2021-04-19 DIAGNOSIS — I10 Essential (primary) hypertension: Secondary | ICD-10-CM

## 2021-04-19 LAB — COMPREHENSIVE METABOLIC PANEL
ALT: 17 U/L (ref 0–53)
AST: 25 U/L (ref 0–37)
Albumin: 4.3 g/dL (ref 3.5–5.2)
Alkaline Phosphatase: 75 U/L (ref 39–117)
BUN: 16 mg/dL (ref 6–23)
CO2: 30 mEq/L (ref 19–32)
Calcium: 9.9 mg/dL (ref 8.4–10.5)
Chloride: 97 mEq/L (ref 96–112)
Creatinine, Ser: 1.26 mg/dL (ref 0.40–1.50)
GFR: 56.47 mL/min — ABNORMAL LOW (ref >60.00)
Glucose, Bld: 190 mg/dL — ABNORMAL HIGH (ref 70–99)
Potassium: 4 mEq/L (ref 3.5–5.1)
Sodium: 136 mEq/L (ref 135–145)
Total Bilirubin: 0.5 mg/dL (ref 0.2–1.2)
Total Protein: 7.5 g/dL (ref 6.0–8.3)

## 2021-04-19 LAB — CBC WITH DIFFERENTIAL/PLATELET
Basophils Absolute: 0.1 10*3/uL (ref 0.0–0.1)
Basophils Relative: 0.6 % (ref 0.0–3.0)
Eosinophils Absolute: 0.1 10*3/uL (ref 0.0–0.7)
Eosinophils Relative: 1 % (ref 0.0–5.0)
HCT: 37.6 % — ABNORMAL LOW (ref 39.0–52.0)
Hemoglobin: 12.7 g/dL — ABNORMAL LOW (ref 13.0–17.0)
Lymphocytes Relative: 24.6 % (ref 12.0–46.0)
Lymphs Abs: 2.6 10*3/uL (ref 0.7–4.0)
MCHC: 33.6 g/dL (ref 30.0–36.0)
MCV: 89.3 fl (ref 78.0–100.0)
Monocytes Absolute: 0.7 10*3/uL (ref 0.1–1.0)
Monocytes Relative: 6.7 % (ref 3.0–12.0)
Neutro Abs: 7.1 10*3/uL (ref 1.4–7.7)
Neutrophils Relative %: 67.1 % (ref 43.0–77.0)
Platelets: 261 10*3/uL (ref 150.0–400.0)
RBC: 4.21 Mil/uL — ABNORMAL LOW (ref 4.22–5.81)
RDW: 13.5 % (ref 11.5–15.5)
WBC: 10.5 10*3/uL (ref 4.0–10.5)

## 2021-04-19 LAB — MICROALBUMIN / CREATININE URINE RATIO
Creatinine,U: 128.7 mg/dL
Microalb Creat Ratio: 13.1 mg/g (ref 0.0–30.0)
Microalb, Ur: 16.8 mg/dL — ABNORMAL HIGH (ref 0.0–1.9)

## 2021-04-19 LAB — URINALYSIS, ROUTINE W REFLEX MICROSCOPIC
Bilirubin Urine: NEGATIVE
Hgb urine dipstick: NEGATIVE
Ketones, ur: NEGATIVE
Leukocytes,Ua: NEGATIVE
Nitrite: NEGATIVE
RBC / HPF: NONE SEEN (ref 0–?)
Specific Gravity, Urine: 1.02 (ref 1.000–1.030)
Total Protein, Urine: 30 — AB
Urine Glucose: NEGATIVE
Urobilinogen, UA: 1 (ref 0.0–1.0)
pH: 6 (ref 5.0–8.0)

## 2021-04-19 LAB — PSA: PSA: 1.61 ng/mL (ref 0.10–4.00)

## 2021-04-19 LAB — HEMOGLOBIN A1C: Hgb A1c MFr Bld: 8.5 % — ABNORMAL HIGH (ref 4.6–6.5)

## 2021-04-19 LAB — TSH: TSH: 1.67 u[IU]/mL (ref 0.35–5.50)

## 2021-04-19 MED ORDER — FREESTYLE LIBRE 2 SENSOR MISC: 1.0000 | 6 refills | Status: DC

## 2021-04-19 MED ORDER — LANTUS SOLOSTAR 100 UNIT/ML ~~LOC~~ SOPN: 22.0000 [IU] | PEN_INJECTOR | Freq: Two times a day (BID) | SUBCUTANEOUS | 0 refills | Status: DC

## 2021-04-19 MED ORDER — GLIPIZIDE ER 10 MG PO TB24: 10.0000 mg | ORAL_TABLET | Freq: Two times a day (BID) | ORAL | 0 refills | Status: DC

## 2021-04-19 MED ORDER — HYDROCHLOROTHIAZIDE 25 MG PO TABS: 25.0000 mg | ORAL_TABLET | Freq: Every day | ORAL | 3 refills | Status: DC

## 2021-04-19 NOTE — Patient Instructions (Addendum)
It was great seeing you today  ? ?We will follow up with you regarding your lab work  ? ?Please let me know if you need anything  ? ?Follow up in 3 months or sooner if needed ? ?

## 2021-04-19 NOTE — Progress Notes (Signed)
Subjective:    Patient ID: KOA ZOELLER, male    DOB: [birth year], 76 y.o.   MRN: [id number]  Patient presents for yearly preventative medicine examination. He is a pleasant 76 year old male who  has a past medical history of Aortic stenosis, Blood transfusion without reported diagnosis, Carpal tunnel syndrome (09/24/2014), Coronary artery disease, COVID-19, Diabetes mellitus, type 2 ([place]), Erectile dysfunction, Essential hypertension, Hyperlipidemia, and Palpitations.   DM -currently prescribed metformin 1000 mg twice daily, glipizide 10 mg twice daily, and Lantus 22 units subqq twice daily. He has been checking his blood sugars more frequently and has been getting readings in the 120-150's.  Lab Results  Component Value Date   HGBA1C 8.2 (A) 01/18/2021   Essential Hypertension -currently prescribed HCTZ 25 mg daily and Lopressor 25 mg twice daily.  He denies dizziness, lightheadedness, chest pain, shortness of breath BP Readings from Last 3 Encounters:  04/19/21 118/60  04/01/21 124/70  03/21/21 (!) 176/73   Hyperlipidemia/CAD-managed by cardiology.  Currently prescribed Crestor 40 mg daily and aspirin 81 mg daily.  He denies myalgia, fatigue, chest pain, or palpitations  All immunizations and health maintenance protocols were reviewed with the patient and needed orders were placed.  Appropriate screening laboratory values were ordered for the patient including screening of hyperlipidemia, renal function and hepatic function.  Medication reconciliation,  past medical history, social history, problem list and allergies were reviewed in detail with the patient  Goals were established with regard to weight loss, exercise, and  diet in compliance with medications. He does report that he is eating a little healthier.   Wt Readings from Last 3 Encounters:  04/19/21 181 lb (82.1 kg)  04/01/21 186 lb 6.4 oz (84.6 kg)  03/21/21 180 lb (81.6 kg)    Review of Systems  Constitutional:  Negative.   HENT: Negative.    Eyes: Negative.   Respiratory: Negative.    Cardiovascular: Negative.   Gastrointestinal: Negative.   Endocrine: Negative.   Genitourinary: Negative.   Musculoskeletal: Negative.   Skin: Negative.   Allergic/Immunologic: Negative.   Neurological: Negative.   Hematological: Negative.   Psychiatric/Behavioral: Negative.    All other systems reviewed and are negative. 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   Blood transfusion without reported diagnosis    Carpal tunnel syndrome 09/24/2014   Bilateral   Coronary artery disease    Multivessel status post CABG September 2017   COVID-19    Diabetes mellitus, type 2 ([place])    Erectile dysfunction    Essential hypertension    Hyperlipidemia    Palpitations    Cardiac monitor 03/2019: normal sinus rhythm, no AF, rare NSVT, VT    Social History   Socioeconomic History   Marital status: Widowed    Spouse name: Not on file   Number of children: 5   Years of education: 10    Highest education level: 10th grade  Occupational History   Occupation: custodian    Comment: PT  Tobacco Use   Smoking status: Never   Smokeless tobacco: Never  Vaping Use   Vaping Use: Never used  Substance and Sexual Activity   Alcohol use: Yes    Comment: 1 x month   Drug use: No   Sexual activity: Yes  Other Topics Concern   Not on file  Social History Narrative   Widowed to Reymond Maynez for 14 years; 2 daughters liver  with him now   Works part time as Chiropodist   5 children; 1 son deceased   Never Smoked   Alcohol use- no   Caffeine use: occasional       Lost one child a few months ago    Lost his sister, then son and then nephew - all within 2 weeks apart   Son had a stroke at 6    Was planning to get married last month in Feb.    Social Determinants of Health   Financial Resource Strain: Low Risk    Difficulty of Paying Living  Expenses: Not hard at all  Food Insecurity: No Food Insecurity   Worried About Charity fundraiser in the Last Year: Never true   Arboriculturist in the Last Year: Never true  Transportation Needs: No Transportation Needs   Lack of Transportation (Medical): No   Lack of Transportation (Non-Medical): No  Physical Activity: Sufficiently Active   Days of Exercise per Week: 5 days   Minutes of Exercise per Session: 30 min  Stress: No Stress Concern Present   Feeling of Stress : Not at all  Social Connections: Moderately Integrated   Frequency of Communication with Friends and Family: More than three times a week   Frequency of Social Gatherings with Friends and Family: More than three times a week   Attends Religious Services: More than 4 times per year   Active Member of [employer] or Organizations: Yes   Attends Archivist Meetings: More than 4 times per year   Marital Status: Widowed  Intimate Partner Violence: Not At Risk   Fear of Current or Ex-Partner: No   Emotionally Abused: No   Physically Abused: No   Sexually Abused: No    Past Surgical History:  Procedure Laterality Date   AORTIC VALVE REPLACEMENT N/A 10/29/2015   Procedure: AORTIC VALVE REPLACEMENT (AVR) WITH 23MM MAGNA EASE TISSUE VALVE.;  Surgeon: [name], MD;  Location: [place] [facility];  Service: Open Heart Surgery;  Laterality: N/A;   CARDIAC CATHETERIZATION N/A 10/27/2015   Procedure: Left Heart Cath and Coronary Angiography;  Surgeon: [name], MD;  Location: [facility];  Service: Cardiovascular;  Laterality: N/A;   COLONOSCOPY  09/02/2015   Dr.Pyrtle   CORONARY ARTERY BYPASS GRAFT N/A 10/29/2015   Procedure: CORONARY ARTERY BYPASS GRAFTING (CABG) x Four UTILIZING THE LEFT INTERNAL MAMMARY ARTERY AND ENDOSCOPICALLY HARVESTED RIGHT SAPEHENEOUS VEINS.;  Surgeon: [name], MD;  Location: [facility];  Service: Open Heart Surgery;  Laterality: N/A;   CYST REMOVAL HAND Right     ESOPHAGOGASTRODUODENOSCOPY N/A 12/02/2015   Procedure: ESOPHAGOGASTRODUODENOSCOPY (EGD);  Surgeon: [name], MD;  Location: [facility];  Service: Gastroenterology;  Laterality: N/A;   REPLACEMENT ASCENDING AORTA N/A 10/29/2015   Procedure: REPLACEMENT OF ASCENDING AORTA USING 34MM X 30CM WOVEN DOUBLE VELOUR VASCULAR GRAFT.;  Surgeon: [name], MD;  Location: [facility];  Service: Open Heart Surgery;  Laterality: N/A;   TEE WITHOUT CARDIOVERSION N/A 10/29/2015   Procedure: TRANSESOPHAGEAL ECHOCARDIOGRAM (TEE);  Surgeon: [name], MD;  Location: [facility] [place];  Service: Open Heart Surgery;  Laterality: N/A;   UVULECTOMY N/A 12/10/2015   Procedure: UVULECTOMY;  Surgeon: [name], MD;  [facility];  Service: ENT;  Laterality: N/A;    Family History  Problem Relation Age of Onset   Cancer Father        Throat cancer   Diabetes Sister  Colon cancer Neg Hx     Allergies  Allergen Reactions   Amiodarone Nausea And Vomiting   Quinolones     Patient was warned about not using Cipro and similar antibiotics. Recent studies have raised concern that fluoroquinolone antibiotics could be associated with an increased risk of aortic aneurysm Fluoroquinolones have non-antimicrobial properties that might jeopardise the integrity of the extracellular matrix of the vascular wall In a  propensity score matched cohort study in [place], there was a 66% increased rate of aortic aneurysm or dissection associated with oral fluoroquinolone use, compared wit    Current Outpatient Medications on File Prior to Visit  Medication Sig Dispense Refill   Accu-Chek Softclix Lancets lancets Use as instructed 100 each 12   aspirin EC 81 MG tablet Take 81 mg by mouth daily.     Blood Glucose Monitoring Suppl (ACCU-CHEK GUIDE ME) w/Device KIT 1 Stick by Does not apply route 3 (three) times daily. Used to check blood sugar 3 times daily 1 kit 0   glipiZIDE (GLUCOTROL XL) 10 MG 24 hr tablet TAKE  1 TABLET (10 MG TOTAL) BY MOUTH IN THE MORNING AND AT BEDTIME. 180 tablet 0   glucose blood (ACCU-CHEK GUIDE) test strip Use as instructed 100 each 1   hydrochlorothiazide (HYDRODIURIL) 25 MG tablet Take 1 tablet (25 mg total) by mouth daily. 90 tablet 3   hydrOXYzine (ATARAX/VISTARIL) 50 MG tablet Take 1 tablet (50 mg total) by mouth 3 (three) times daily as needed for itching. 60 tablet 0   Insulin Pen Needle (B-D ULTRAFINE III SHORT PEN) 31G X 8 MM MISC Use BID 200 each 3   metFORMIN (GLUCOPHAGE) 1000 MG tablet TAKE 1 TABLET (1,000 MG TOTAL) BY MOUTH 2 (TWO) TIMES DAILY WITH A MEAL. 180 tablet 0   metoprolol tartrate (LOPRESSOR) 25 MG tablet TAKE 1 TABLET BY MOUTH TWICE A DAY 180 tablet 1   ondansetron (ZOFRAN) 4 MG tablet Take 1 tablet (4 mg total) by mouth every 6 (six) hours. 12 tablet 0   rosuvastatin (CRESTOR) 40 MG tablet TAKE 1 TABLET BY MOUTH EVERYDAY AT BEDTIME 90 tablet 1   insulin glargine (LANTUS SOLOSTAR) 100 UNIT/ML Solostar Pen Inject 22 Units into the skin 2 (two) times daily. 40 mL 0   No current facility-administered medications on file prior to visit.    BP 118/60    Pulse 70    Temp 99 F (37.2 C) (Oral)    Ht 5' 10.5" (1.791 m)    Wt 181 lb (82.1 kg)    SpO2 98%    BMI 25.60 kg/m       Objective:   Physical Exam Vitals and nursing note reviewed.  Constitutional:      General: He is not in acute distress.    Appearance: Normal appearance. He is well-developed and normal weight.  HENT:     Head: Normocephalic and atraumatic.     Right Ear: Tympanic membrane, ear canal and external ear normal. There is no impacted cerumen.     Left Ear: Tympanic membrane, ear canal and external ear normal. There is no impacted cerumen.     Nose: Nose normal. No congestion or rhinorrhea.     Mouth/Throat:     Mouth: Mucous membranes are moist.     Dentition: Has dentures.     Pharynx: Oropharynx is clear. No oropharyngeal exudate or posterior oropharyngeal erythema.  Eyes:      General:        Right eye: No  discharge.        Left eye: No discharge.     Extraocular Movements: Extraocular movements intact.     Conjunctiva/sclera: Conjunctivae normal.     Pupils: Pupils are equal, round, and reactive to light.  Neck:     Vascular: No carotid bruit.     Trachea: No tracheal deviation.  Cardiovascular:     Rate and Rhythm: Normal rate and regular rhythm.     Pulses: Normal pulses.     Heart sounds: Murmur heard.    No friction rub. No gallop.  Pulmonary:     Effort: Pulmonary effort is normal. No respiratory distress.     Breath sounds: Normal breath sounds. No stridor. No wheezing, rhonchi or rales.  Chest:     Chest wall: No tenderness.  Abdominal:     General: Bowel sounds are normal. There is no distension.     Palpations: Abdomen is soft. There is no mass.     Tenderness: There is no abdominal tenderness. There is no right CVA tenderness, left CVA tenderness, guarding or rebound.     Hernia: No hernia is present.  Musculoskeletal:        General: No swelling, tenderness, deformity or signs of injury. Normal range of motion.     Right lower leg: No edema.     Left lower leg: No edema.  Lymphadenopathy:     Cervical: No cervical adenopathy.  Skin:    General: Skin is warm and dry.     Capillary Refill: Capillary refill takes less than 2 seconds.     Coloration: Skin is not jaundiced or pale.     Findings: No bruising, erythema, lesion or rash.  Neurological:     General: No focal deficit present.     Mental Status: He is alert and oriented to person, place, and time.     Cranial Nerves: No cranial nerve deficit.     Sensory: No sensory deficit.     Motor: No weakness.     Coordination: Coordination normal.     Gait: Gait normal.     Deep Tendon Reflexes: Reflexes normal.  Psychiatric:        Mood and Affect: Mood normal.        Behavior: Behavior normal.        Thought Content: Thought content normal.        Judgment: Judgment normal.       Assessment & Plan:  1. Routine general medical examination at a health care facility - Encouraged lifestyle modifications  - Follow up in one year or sooner if needed - CBC with Differential/Platelet; Future - Comprehensive metabolic panel; Future - Hemoglobin A1c; Future - TSH; Future - Urinalysis; Future  2. Type 2 diabetes mellitus with other specified complication, with long-term current use of insulin (HCC) - Consider change in lantus dose  3 month follow up  - CBC with Differential/Platelet; Future - Comprehensive metabolic panel; Future - Hemoglobin A1c; Future - TSH; Future - Urinalysis; Future - Continuous Blood Gluc Sensor (FREESTYLE LIBRE 2 SENSOR) MISC; 1 Device by Does not apply route every 14 (fourteen) days.  Dispense: 2 each; Refill: 6 - insulin glargine (LANTUS SOLOSTAR) 100 UNIT/ML Solostar Pen; Inject 22 Units into the skin 2 (two) times daily.  Dispense: 40 mL; Refill: 0 - Microalbumin/Creatinine Ratio, Urine  3. Essential hypertension - Continue current BP medication  - hydrochlorothiazide (HYDRODIURIL) 25 MG tablet; Take 1 tablet (25 mg total) by mouth daily.  Dispense: 90 tablet;  Refill: 3  4. Prostate cancer screening  - PSA; Future  5. Coronary artery disease involving coronary bypass graft of native heart without angina pectoris - Per cardiology  - CBC with Differential/Platelet; Future - Comprehensive metabolic panel; Future - Hemoglobin A1c; Future - TSH; Future - Urinalysis; Future  [name], NP

## 2021-04-20 ENCOUNTER — Other Ambulatory Visit: Payer: Self-pay | Admitting: Adult Health

## 2021-04-21 DIAGNOSIS — E113311 Type 2 diabetes mellitus with moderate nonproliferative diabetic retinopathy with macular edema, right eye: Secondary | ICD-10-CM | POA: Diagnosis not present

## 2021-04-22 ENCOUNTER — Ambulatory Visit
Admission: RE | Admit: 2021-04-22 | Discharge: 2021-04-22 | Disposition: A | Discharge status: Home / Self Care | Payer: Medicare PPO | Source: Ambulatory Visit | Attending: Urology | Admitting: Urology

## 2021-04-22 ENCOUNTER — Other Ambulatory Visit: Payer: Self-pay

## 2021-04-22 DIAGNOSIS — R310 Gross hematuria: Secondary | ICD-10-CM | POA: Diagnosis not present

## 2021-04-22 DIAGNOSIS — K314 Gastric diverticulum: Secondary | ICD-10-CM | POA: Diagnosis not present

## 2021-04-22 DIAGNOSIS — N323 Diverticulum of bladder: Secondary | ICD-10-CM | POA: Diagnosis not present

## 2021-04-22 MED ORDER — IOHEXOL 300 MG/ML  SOLN
100.0000 mL | Freq: Once | INTRAMUSCULAR | Status: AC | PRN
  Administered 2021-04-22: 100 mL via INTRAVENOUS

## 2021-04-22 MED ORDER — TRULICITY 0.75 MG/0.5ML ~~LOC~~ SOAJ: 0.7500 mg | SUBCUTANEOUS | 1 refills | Status: DC

## 2021-04-22 NOTE — Addendum Note (Signed)
Addended by: Gwenyth Ober R on: 04/22/2021 04:47 PM ? ? Modules accepted: Orders ? ?

## 2021-04-28 ENCOUNTER — Other Ambulatory Visit: Payer: Self-pay

## 2021-04-28 ENCOUNTER — Ambulatory Visit: Payer: Medicare PPO | Admitting: Urology

## 2021-04-28 ENCOUNTER — Encounter: Payer: Self-pay | Admitting: Urology

## 2021-04-28 VITALS — BP 120/70 | HR 84 | Ht 72.0 in | Wt 181.0 lb

## 2021-04-28 DIAGNOSIS — R310 Gross hematuria: Secondary | ICD-10-CM

## 2021-04-28 MED ORDER — TAMSULOSIN HCL 0.4 MG PO CAPS
0.4000 mg | ORAL_CAPSULE | Freq: Every day | ORAL | 0 refills | Status: DC
Start: 2021-04-28 — End: 2021-07-29

## 2021-04-28 NOTE — Progress Notes (Signed)
? ?  04/28/21 ? ?CC:  ?Chief Complaint  ?Patient presents with  ? Cysto  ? ?Indications: High risk hematuria ? ?HPI: Refer to my prior note of 03/21/2021.  Denies recurrent gross hematuria.  CT urogram showed no upper tract abnormalities.  Denies recurrent gross hematuria.  UA today negative RBC/WBC ? ?Blood pressure 120/70, pulse 84, height 6' (1.829 m), weight 181 lb (82.1 kg). ?NED. A&Ox3.   ?No respiratory distress   ?Abd soft, NT, ND ?Normal phallus with bilateral descended testicles ? ?Cystoscopy Procedure Note ? ?Patient identification was confirmed, informed consent was obtained, and patient was prepped using Betadine solution.  Lidocaine jelly was administered per urethral meatus.   ? ? ?Pre-Procedure: ?- Inspection reveals a normal caliber urethral meatus. ? ?Procedure: ?The flexible cystoscope was introduced without difficulty ?- No urethral strictures/lesions are present. ?-  Moderate lateral lobe enlargement with moderate median lobe  prostate  ?-Median lobe with visual occlusion bladder neck ?- Bilateral ureteral orifices identified ?- Bladder mucosa  reveals no ulcers, tumors, or lesions ?- No bladder stones ?-Moderate trabeculation with left posterior lateral wall diverticulum which is free of abnormalities ? ?Retroflexion shows intravesical median lobe ? ? ?Post-Procedure: ?- Patient tolerated the procedure well ? ?Assessment/ Plan: ?No upper tract abnormalities on CTU ?Trilobar BPH ?Hematuria was most likely secondary to UTI at the time of presentation ?He does have urinary hesitancy and a weak urinary stream and was interested in medical management for his BPH ?Rx tamsulosin sent to pharmacy ?Follow-up 3 months with [facility] ? ? ? ?Abbie Sons, MD ? ?

## 2021-04-29 LAB — MICROSCOPIC EXAMINATION: Bacteria, UA: NONE SEEN

## 2021-04-29 LAB — URINALYSIS, COMPLETE
Bilirubin, UA: NEGATIVE
Glucose, UA: NEGATIVE
Leukocytes,UA: NEGATIVE
Nitrite, UA: NEGATIVE
RBC, UA: NEGATIVE
Specific Gravity, UA: 1.025 (ref 1.005–1.030)
Urobilinogen, Ur: 0.2 mg/dL (ref 0.2–1.0)
pH, UA: 5 (ref 5.0–7.5)

## 2021-04-30 ENCOUNTER — Encounter: Payer: Self-pay | Admitting: Urology

## 2021-05-02 ENCOUNTER — Ambulatory Visit (INDEPENDENT_AMBULATORY_CARE_PROVIDER_SITE_OTHER): Payer: Medicare PPO | Admitting: Pharmacist

## 2021-05-02 ENCOUNTER — Other Ambulatory Visit: Payer: Self-pay

## 2021-05-02 DIAGNOSIS — I2510 Atherosclerotic heart disease of native coronary artery without angina pectoris: Secondary | ICD-10-CM | POA: Diagnosis not present

## 2021-05-02 DIAGNOSIS — E782 Mixed hyperlipidemia: Secondary | ICD-10-CM

## 2021-05-02 NOTE — Patient Instructions (Addendum)
Try to decrease the amount of processed meats/foods you eat. Avoid as much as possible. ? ?I will submit a prior authorization for Praluent. I will call you once we hear back. We will also apply for the healthwell grant. ? ?Please call me at [phone] with any questions. ? ?Continue taking rosuvastatin '40mg'$  daily ? ?Fasting Lab work 6/19. Lab opens at 7:15 AM ?

## 2021-05-02 NOTE — Progress Notes (Signed)
Patient ID: JOH RAO                 DOB: November 24, 1945                    MRN: [id number] ? ? ? ? ?HPI: ?[name] is a 76 y.o. male patient referred to lipid clinic by Dr. Acie Fredrickson. PMH is significant for CAD s/p CABG 2017, aortic stenosis s/p AVR 2017, HLD and DM. He saw Dr. Acie Fredrickson in Feb 2023. Labs showed an increase in LDL from from the pervious at 81 to 108.  ? ?Patient presents to Lipid clinic today. 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 Lives with his daughter. Use to walk around the neighborhood when the weather was nice. States he has been taking his rosuvastatin daily.  ? ?Current Medications: rosuvastatin 52m ?Intolerances:  ?Risk Factors: CAD, DM ?LDL goal: <55 ?ApoB goal: <70 ? ?Diet: breakfast: eggs, wheat bread, bacon or sausage ?Lunch: ham or tKuwaitsandwich, baked chicken ?Dinner: baked chicken, salmon broccoli, pinto beans ?Snack: potato chips, cheese curls, pork skins ?Drink: pepsi for low sugar, sparkling water, carrot juice ? ?Exercise: custodian work at school, walks around the block  ? ?Family History:  ?Family History  ?Problem Relation Age of Onset  ? Cancer Father   ?     Throat cancer  ? Diabetes Sister   ? Colon cancer Neg Hx   ? ? ?Social History: no tobacco, no ETOH use ? ?Labs: 04/01/21 TC 214 TG 87 HDL 91 LDL-C 108 (rosuvastatin 484mdaily) ? ?Past Medical History:  ?Diagnosis Date  ? Aortic stenosis   ? 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 Blood transfusion without reported diagnosis   ? Carpal tunnel syndrome 09/24/2014  ? Bilateral  ? Coronary artery disease   ? Multivessel status post CABG September 2017  ? COVID-19   ? Diabetes mellitus, type 2 ([place]  ? Erectile dysfunction   ? Essential hypertension   ? Hyperlipidemia   ? Palpitations   ? Cardiac monitor 03/2019: normal sinus rhythm, no AF, rare NSVT, VT  ? ? ?Current Outpatient Medications on File Prior to Visit  ?Medication Sig Dispense Refill  ? Accu-Chek Softclix Lancets lancets Use as instructed  100 each 12  ? aspirin EC 81 MG tablet Take 81 mg by mouth daily.    ? Blood Glucose Monitoring Suppl (ACCU-CHEK GUIDE ME) w/Device KIT 1 Stick by Does not apply route 3 (three) times daily. Used to check blood sugar 3 times daily 1 kit 0  ? Continuous Blood Gluc Sensor (FREESTYLE LIBRE 2 SENSOR) MISC 1 Device by Does not apply route every 14 (fourteen) days. 2 each 6  ? Dulaglutide (TRULICITY) 0.7.56GEP/3.2RJOPN Inject 0.75 mg into the skin once a week. 2 mL 1  ? glipiZIDE (GLUCOTROL XL) 10 MG 24 hr tablet Take 1 tablet (10 mg total) by mouth in the morning and at bedtime. 180 tablet 0  ? glucose blood (ACCU-CHEK GUIDE) test strip Use as instructed 100 each 1  ? hydrochlorothiazide (HYDRODIURIL) 25 MG tablet Take 1 tablet (25 mg total) by mouth daily. 90 tablet 3  ? insulin glargine (LANTUS SOLOSTAR) 100 UNIT/ML Solostar Pen Inject 22 Units into the skin 2 (two) times daily. 40 mL 0  ? Insulin Pen Needle (B-D ULTRAFINE III SHORT PEN) 31G X 8 MM MISC Use BID 200 each 3  ? metFORMIN (GLUCOPHAGE) 1000 MG tablet TAKE 1 TABLET (  1,000 MG TOTAL) BY MOUTH 2 (TWO) TIMES DAILY WITH A MEAL. 180 tablet 0  ? metoprolol tartrate (LOPRESSOR) 25 MG tablet TAKE 1 TABLET BY MOUTH TWICE A DAY 180 tablet 1  ? rosuvastatin (CRESTOR) 40 MG tablet TAKE 1 TABLET BY MOUTH EVERYDAY AT BEDTIME 90 tablet 1  ? tamsulosin (FLOMAX) 0.4 MG CAPS capsule Take 1 capsule (0.4 mg total) by mouth daily. 90 capsule 0  ? ?No current facility-administered medications on file prior to visit.  ? ? ?Allergies  ?Allergen Reactions  ? Amiodarone Nausea And Vomiting  ? Quinolones   ?  Patient was warned about not using Cipro and similar antibiotics. ?Recent studies have raised concern that fluoroquinolone antibiotics could be associated with an increased risk of aortic aneurysm ?Fluoroquinolones have non-antimicrobial properties that might jeopardise the integrity of the extracellular matrix of the vascular wall ?In a  propensity score matched cohort study in  [place], there was a 66% increased rate of aortic aneurysm or dissection associated with oral fluoroquinolone use, compared wit  ? ? ?Assessment/Plan: ? ?1. Hyperlipidemia - LDL-C is above goal of <55. Discussed PCSK9i vs Leqvio with patient. Reviewed injection technique and cost. Patient would like to try PCSK9i. I will submit a prior authorization for Praluent 148m q 14 days. Will also apply for healthwell grant. Continue rosuvastatin 431mdaily. Advised patient increase his physical activity- add in daily walk around neighborhood. We talked about dangers of processed foods and recommended he avoid them. Soda only for low BG. Watch vegetable and fruit intake even if home made. Follow up labs in 3 months. ? ? ?Thank you, ? ? ?MeRamond DialPharm.D, BCPS, CPP ?[email or web address] [address]?Phone: [phone]: [phone]? ? ?

## 2021-05-03 ENCOUNTER — Telehealth: Payer: Self-pay | Admitting: Pharmacist

## 2021-05-03 MED ORDER — REPATHA SURECLICK 140 MG/ML ~~LOC~~ SOAJ: 1.0000 "pen " | SUBCUTANEOUS | 3 refills | Status: DC

## 2021-05-03 NOTE — Telephone Encounter (Signed)
Called the hwf grant into the pharmacy and they stated it went through as no charge ?

## 2021-05-03 NOTE — Telephone Encounter (Signed)
Repatha approved through 10/26/2021. ?Healthwell grant approved through 04/02/22 ? ?CARD NO. ?[id number] ?  ?CARD STATUS ?Active ?  ?BIN ?[id number] ?  ?PCN ?[id number] ?  ?PC GROUP ?[id number] ?

## 2021-05-04 DIAGNOSIS — E113312 Type 2 diabetes mellitus with moderate nonproliferative diabetic retinopathy with macular edema, left eye: Secondary | ICD-10-CM | POA: Diagnosis not present

## 2021-05-05 NOTE — Telephone Encounter (Signed)
Patient made aware that Repatha was approved along with healthwell grant. Rx and grant already called into pharmacy. Labs already scheduled for 6/19. ?

## 2021-05-18 ENCOUNTER — Encounter: Payer: Self-pay | Admitting: Surgery

## 2021-05-18 ENCOUNTER — Ambulatory Visit: Payer: Medicare PPO | Admitting: Surgery

## 2021-05-18 ENCOUNTER — Ambulatory Visit
Admission: RE | Admit: 2021-05-18 | Discharge: 2021-05-18 | Disposition: A | Discharge status: Home / Self Care | Payer: Medicare PPO | Source: Ambulatory Visit | Attending: Surgery | Admitting: Surgery

## 2021-05-18 VITALS — BP 117/56 | HR 82 | Resp 20 | Ht 72.0 in | Wt 177.0 lb

## 2021-05-18 DIAGNOSIS — I7121 Aneurysm of the ascending aorta, without rupture: Secondary | ICD-10-CM | POA: Diagnosis not present

## 2021-05-18 MED ORDER — IOPAMIDOL (ISOVUE-370) INJECTION 76%
75.0000 mL | Freq: Once | INTRAVENOUS | Status: AC | PRN
  Administered 2021-05-18: 75 mL via INTRAVENOUS

## 2021-05-18 NOTE — Progress Notes (Signed)
? ? ? ?HPI: ? ?The Mathis is a 76 year old gentleman who underwent coronary artery bypass graft surgery x4, aortic valve replacement, and supra-coronary replacement of an ascending aortic aneurysm by Dr. Servando Snare in September 2017.  He has a 23 mm Edwards pericardial valve.  He last saw Dr. Servando Snare 1 year ago on 05/06/2020.  He has some enlargement of the proximal aortic arch distal to the ascending graft which Dr. Servando Snare has been following.  He has had no change to his medical history since last year.  He has diabetes with a hemoglobin A1c around 8 and has been started on Trulicity in addition to his other medications he has also been started on Repatha for management of his cholesterol.  He denies any chest pain or pressure.  He has had no shortness of breath or peripheral edema. ? ?Current Outpatient Medications  ?Medication Sig Dispense Refill  ? Accu-Chek Softclix Lancets lancets Use as instructed 100 each 12  ? aspirin EC 81 MG tablet Take 81 mg by mouth daily.    ? Blood Glucose Monitoring Suppl (ACCU-CHEK GUIDE ME) w/Device KIT 1 Stick by Does not apply route 3 (three) times daily. Used to check blood sugar 3 times daily 1 kit 0  ? Continuous Blood Gluc Sensor (FREESTYLE LIBRE 2 SENSOR) MISC 1 Device by Does not apply route every 14 (fourteen) days. 2 each 6  ? Dulaglutide (TRULICITY) 4.58 KD/9.8PJ SOPN Inject 0.75 mg into the skin once a week. 2 mL 1  ? Evolocumab (REPATHA SURECLICK) 825 MG/ML SOAJ Inject 1 pen. into the skin every 14 (fourteen) days. 6 mL 3  ? glipiZIDE (GLUCOTROL XL) 10 MG 24 hr tablet Take 1 tablet (10 mg total) by mouth in the morning and at bedtime. 180 tablet 0  ? glucose blood (ACCU-CHEK GUIDE) test strip Use as instructed 100 each 1  ? hydrochlorothiazide (HYDRODIURIL) 25 MG tablet Take 1 tablet (25 mg total) by mouth daily. 90 tablet 3  ? insulin glargine (LANTUS SOLOSTAR) 100 UNIT/ML Solostar Pen Inject 22 Units into the skin 2 (two) times daily. 40 mL 0  ? Insulin Pen Needle  (B-D ULTRAFINE III SHORT PEN) 31G X 8 MM MISC Use BID 200 each 3  ? metFORMIN (GLUCOPHAGE) 1000 MG tablet TAKE 1 TABLET (1,000 MG TOTAL) BY MOUTH 2 (TWO) TIMES DAILY WITH A MEAL. 180 tablet 0  ? metoprolol tartrate (LOPRESSOR) 25 MG tablet TAKE 1 TABLET BY MOUTH TWICE A DAY 180 tablet 1  ? rosuvastatin (CRESTOR) 40 MG tablet TAKE 1 TABLET BY MOUTH EVERYDAY AT BEDTIME 90 tablet 1  ? tamsulosin (FLOMAX) 0.4 MG CAPS capsule Take 1 capsule (0.4 mg total) by mouth daily. 90 capsule 0  ? ?No current facility-administered medications for this visit.  ? ? ? ?Physical Exam: ?BP (!) 117/56 (BP Location: Right Arm, Mathis Position: Sitting)   Pulse 82   Resp 20   Ht 6' (1.829 m)   Wt 177 lb (80.3 kg)   SpO2 100% Comment: RA  BMI 24.01 kg/m?  ?He looks well. ?Cardiac exam shows regular rate and rhythm with normal heart sounds.  There is no murmur. ?Lungs are clear. ?There is no peripheral edema ? ?Diagnostic Tests: ? ?Narrative & Impression  ?CLINICAL DATA:  Follow-up ascending aortic aneurysm ?  ?EXAM: ?CT ANGIOGRAPHY CHEST WITH CONTRAST ?  ?TECHNIQUE: ?Multidetector CT imaging of the chest was performed using the ?standard protocol during bolus administration of intravenous ?contrast. Multiplanar CT image reconstructions and MIPs were ?obtained to  evaluate the vascular anatomy. ?  ?RADIATION DOSE REDUCTION: This exam was performed according to the ?departmental dose-optimization program which includes automated ?exposure control, adjustment of the mA and/or kV according to ?Mathis size and/or use of iterative reconstruction technique. ?  ?CONTRAST:  68m ISOVUE-370 IOPAMIDOL (ISOVUE-370) INJECTION 76% ?  ?COMPARISON:  05/06/2020 ?  ?FINDINGS: ?Cardiovascular: Postsurgical changes status post prior aortic valve ?replacement and ascending thoracic aorta replacement as well as ?CABG. Unchanged dilation of the distal ascending thoracic ?aorta/proximal arch measuring up to 4.4 cm in diameter (coronal ?series 14, image 58).  Common origin of the brachiocephalic artery ?and left common carotid arteries which remains dilated at 2.6 cm ?(series 16, image 112), unchanged. Negative for thoracic aortic ?dissection. Precontrast images demonstrate no evidence of aortic ?intramural hematoma. Atherosclerotic calcification of the aorta and ?coronary arteries. ?  ?Central pulmonary vasculature is within normal limits. No central ?filling defects. Heart size is normal. No pericardial effusion. ?  ?Mediastinum/Nodes: No enlarged mediastinal, hilar, or axillary lymph ?nodes. Thyroid gland, trachea, and esophagus demonstrate no ?significant findings. ?  ?Lungs/Pleura: Lungs are clear. No pleural effusion or pneumothorax. ?  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 No acute findings. ?  ?Musculoskeletal: No chest wall abnormality. No acute or significant ?osseous findings. ?  ?Review of the MIP images confirms the above findings. ?  ?IMPRESSION: ?1. Stable dilation of the distal ascending thoracic aorta/proximal ?arch measuring up to 4.4 cm in diameter. ?2. 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 ?3. Lungs are clear. ?  ?  ?Electronically Signed ?  By: NDavina PokeD.O. ?  On: 05/18/2021 11:44 ?   ? ? ?Impression: ? ?He has stable enlargement of the proximal aortic arch to 4.4 cm with bovine origin of the brachiocephalic and left common carotid arteries with a diameter of 2.6 cm.  I reviewed the CT images with him and answered his questions.  This does not require any surgical treatment at this time but should have continued follow-up.  I stressed the importance of good blood pressure control in preventing further enlargement and acute aortic dissection.  I advised him against taking quinolone antibiotics that have been associated with aortic aneurysms.  I also advised him against doing any strenuous lifting that may require a  Valsalva maneuver and could suddenly raise his blood pressure to high levels.  His last echocardiogram in January 2021 showed a normally functioning aortic valve bioprosthesis with a mean gradient of 6 mmHg and no aortic insufficiency. ? ?Plan: ? ?He will return to see me in 1 year with a CTA of the chest. ? ?I spent 20 minutes performing this established Mathis evaluation and > 50% of this time was spent face to face counseling and coordinating the care of this Mathis's aortic aneurysm.  ? ? ?BGaye Pollack MD ?[facility] ?[phone]? ? ? ? ? ? ?

## 2021-05-23 ENCOUNTER — Encounter: Payer: Self-pay | Admitting: Adult Health

## 2021-06-01 DIAGNOSIS — E113311 Type 2 diabetes mellitus with moderate nonproliferative diabetic retinopathy with macular edema, right eye: Secondary | ICD-10-CM | POA: Diagnosis not present

## 2021-06-08 DIAGNOSIS — H31093 Other chorioretinal scars, bilateral: Secondary | ICD-10-CM | POA: Diagnosis not present

## 2021-06-08 DIAGNOSIS — H25813 Combined forms of age-related cataract, bilateral: Secondary | ICD-10-CM | POA: Diagnosis not present

## 2021-06-08 DIAGNOSIS — E113313 Type 2 diabetes mellitus with moderate nonproliferative diabetic retinopathy with macular edema, bilateral: Secondary | ICD-10-CM | POA: Diagnosis not present

## 2021-06-08 DIAGNOSIS — H3582 Retinal ischemia: Secondary | ICD-10-CM | POA: Diagnosis not present

## 2021-06-25 ENCOUNTER — Other Ambulatory Visit: Payer: Self-pay | Admitting: Adult Health

## 2021-06-29 DIAGNOSIS — E113312 Type 2 diabetes mellitus with moderate nonproliferative diabetic retinopathy with macular edema, left eye: Secondary | ICD-10-CM | POA: Diagnosis not present

## 2021-07-11 ENCOUNTER — Other Ambulatory Visit: Payer: Self-pay | Admitting: Cardiovascular Disease

## 2021-07-13 DIAGNOSIS — E113311 Type 2 diabetes mellitus with moderate nonproliferative diabetic retinopathy with macular edema, right eye: Secondary | ICD-10-CM | POA: Diagnosis not present

## 2021-07-23 ENCOUNTER — Other Ambulatory Visit: Payer: Self-pay | Admitting: Adult Health

## 2021-07-23 DIAGNOSIS — E1169 Type 2 diabetes mellitus with other specified complication: Secondary | ICD-10-CM

## 2021-07-29 ENCOUNTER — Ambulatory Visit: Payer: Medicare PPO | Admitting: Urology

## 2021-07-29 ENCOUNTER — Encounter: Payer: Self-pay | Admitting: Urology

## 2021-07-29 VITALS — BP 145/72 | HR 80 | Ht 70.5 in | Wt 172.6 lb

## 2021-07-29 DIAGNOSIS — R3912 Poor urinary stream: Secondary | ICD-10-CM

## 2021-07-29 DIAGNOSIS — N401 Enlarged prostate with lower urinary tract symptoms: Secondary | ICD-10-CM | POA: Diagnosis not present

## 2021-07-29 DIAGNOSIS — Z87898 Personal history of other specified conditions: Secondary | ICD-10-CM

## 2021-07-29 LAB — BLADDER SCAN AMB NON-IMAGING

## 2021-07-29 MED ORDER — TAMSULOSIN HCL 0.4 MG PO CAPS: 0.4000 mg | ORAL_CAPSULE | Freq: Every day | ORAL | 3 refills | Status: DC

## 2021-07-29 NOTE — Progress Notes (Signed)
07/29/2021 10:11 AM   [name] [birth year] [id number]  Referring provider: [name], NP [place],   [id number]  Chief Complaint  Patient presents with   Benign Prostatic Hypertrophy    Urologic history: 1.  Gross hematuria January 2023 in association with UTI CTU 04/2021 no upper tract abnormalities Cystoscopy BPH with trilobar enlargement  2.  BPH with LUTS Started tamsulosin 04/2021   HPI: 76 y.o. male presents for 61-month follow-up.  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   Blood transfusion without reported diagnosis    Carpal tunnel syndrome 09/24/2014   Bilateral   Coronary artery disease    Multivessel status post CABG September 2017   COVID-19    Diabetes mellitus, type 2 ([place])    Erectile dysfunction    Essential hypertension    Hyperlipidemia    Palpitations    Cardiac monitor 03/2019: normal sinus rhythm, no AF, rare NSVT, VT    Surgical History: Past Surgical History:  Procedure Laterality Date   AORTIC VALVE REPLACEMENT N/A 10/29/2015   Procedure: AORTIC VALVE REPLACEMENT (AVR) WITH 23MM MAGNA EASE TISSUE VALVE.;  Surgeon: [name], MD;  Location: [facility];  Service: Open Heart Surgery;  Laterality: N/A;   CARDIAC CATHETERIZATION N/A 10/27/2015   Procedure: Left Heart Cath and Coronary Angiography;  Surgeon: [name], MD;  Location: [facility];  Service: Cardiovascular;  Laterality: N/A;   COLONOSCOPY  09/02/2015   Dr.Pyrtle   CORONARY ARTERY BYPASS GRAFT N/A 10/29/2015   Procedure: CORONARY ARTERY BYPASS GRAFTING (CABG) x Four UTILIZING THE LEFT INTERNAL MAMMARY ARTERY AND ENDOSCOPICALLY HARVESTED RIGHT SAPEHENEOUS VEINS.;  Surgeon: [name], MD;   Location: [facility];  Service: Open Heart Surgery;  Laterality: N/A;   CYST REMOVAL HAND Right    ESOPHAGOGASTRODUODENOSCOPY N/A 12/02/2015   Procedure: ESOPHAGOGASTRODUODENOSCOPY (EGD);  Surgeon: [name], MD;  Location: [facility];  Service: Gastroenterology;  Laterality: N/A;   REPLACEMENT ASCENDING AORTA N/A 10/29/2015   Procedure: REPLACEMENT OF ASCENDING AORTA USING 34MM X 30CM WOVEN DOUBLE VELOUR VASCULAR GRAFT.;  Surgeon: [name], MD;  Location: [facility];  Service: Open Heart Surgery;  Laterality: N/A;   TEE WITHOUT CARDIOVERSION N/A 10/29/2015   Procedure: TRANSESOPHAGEAL ECHOCARDIOGRAM (TEE);  Surgeon: [name], MD;  Location: [facility];  Service: Open Heart Surgery;  Laterality: N/A;   UVULECTOMY N/A 12/10/2015   Procedure: UVULECTOMY;  Surgeon: [name], MD;  Location: [facility];  Service: ENT;  Laterality: N/A;    Home Medications:  Allergies as of 07/29/2021       Reactions   Amiodarone Nausea And Vomiting   Quinolones    Patient was warned about not using Cipro and similar antibiotics. Recent studies have raised concern that fluoroquinolone antibiotics could be associated with an increased risk of aortic aneurysm Fluoroquinolones have non-antimicrobial properties that might jeopardise the integrity of the extracellular matrix of the vascular wall In a  propensity score matched cohort study in [place], there was a 66% increased rate of aortic aneurysm or dissection associated with oral fluoroquinolone use, compared wit        Medication List        Accurate as of July 29, 2021 10:11 AM. If  you have any questions, ask your nurse or doctor.          Accu-Chek Guide Me w/Device Kit 1 Stick by Does not apply route 3 (three) times daily. Used to check blood sugar 3 times daily   Accu-Chek Guide test strip Generic drug: glucose blood Use as instructed   Accu-Chek Softclix Lancets lancets Use as instructed   aspirin EC 81 MG tablet Take 81  mg by mouth daily.   B-D ULTRAFINE III SHORT PEN 31G X 8 MM Misc Generic drug: Insulin Pen Needle Use BID   FreeStyle Libre 2 Sensor Misc 1 Device by Does not apply route every 14 (fourteen) days.   glipiZIDE 10 MG 24 hr tablet Commonly known as: GLUCOTROL XL TAKE 1 TABLET (10 MG TOTAL) BY MOUTH IN THE MORNING AND AT BEDTIME   hydrochlorothiazide 25 MG tablet Commonly known as: HYDRODIURIL Take 1 tablet (25 mg total) by mouth daily.   Lantus SoloStar 100 UNIT/ML Solostar Pen Generic drug: insulin glargine Inject 22 Units into the skin 2 (two) times daily.   metFORMIN 1000 MG tablet Commonly known as: GLUCOPHAGE TAKE 1 TABLET (1,000 MG TOTAL) BY MOUTH 2 (TWO) TIMES DAILY WITH A MEAL.   metoprolol tartrate 25 MG tablet Commonly known as: LOPRESSOR TAKE 1 TABLET BY MOUTH TWICE A DAY   Repatha SureClick 859 MG/ML Soaj Generic drug: Evolocumab Inject 1 pen. into the skin every 14 (fourteen) days.   rosuvastatin 40 MG tablet Commonly known as: CRESTOR TAKE 1 TABLET BY MOUTH EVERYDAY AT BEDTIME   tamsulosin 0.4 MG Caps capsule Commonly known as: FLOMAX Take 1 capsule (0.4 mg total) by mouth daily.   Trulicity 0.93 JP/2.1KK Sopn Generic drug: Dulaglutide INJECT 0.75 MG SUBCUTANEOUSLY ONE TIME PER WEEK        Allergies:  Allergies  Allergen Reactions   Amiodarone Nausea And Vomiting   Quinolones     Patient was warned about not using Cipro and similar antibiotics. Recent studies have raised concern that fluoroquinolone antibiotics could be associated with an increased risk of aortic aneurysm Fluoroquinolones have non-antimicrobial properties that might jeopardise the integrity of the extracellular matrix of the vascular wall In a  propensity score matched cohort study in [place], there was a 66% increased rate of aortic aneurysm or dissection associated with oral fluoroquinolone use, compared wit    Family History: Family History  Problem Relation Age of Onset    Cancer Father        Throat cancer   Diabetes Sister    Colon cancer Neg Hx     Social History:  reports that he has never smoked. He has never used smokeless tobacco. He reports current alcohol use. He reports that he does not use drugs.   Physical Exam: BP (!) 145/72 (BP Location: Left Arm, Patient Position: Sitting, Cuff Size: Normal)   Pulse 80   Ht 5' 10.5" (1.791 m)   Wt 172 lb 9.6 oz (78.3 kg)   BMI 24.42 kg/m   Constitutional:  Alert and oriented, No acute distress. HEENT: [place] AT Respiratory: Normal respiratory effort, no increased work of breathing. Psychiatric: Normal mood and affect.    Assessment & Plan:    1. Benign prostatic hyperplasia with weak urinary stream Improve voiding symptoms on tamsulosin; refill sent to pharmacy PVR today 40 mL Follow-up 1 year  2.  History gross hematuria    Abbie Sons, MD  [facility] [address], [place], [place] [address] [phone]  [phone]

## 2021-08-01 ENCOUNTER — Other Ambulatory Visit: Payer: Medicare PPO

## 2021-08-01 DIAGNOSIS — I2510 Atherosclerotic heart disease of native coronary artery without angina pectoris: Secondary | ICD-10-CM | POA: Diagnosis not present

## 2021-08-01 DIAGNOSIS — E782 Mixed hyperlipidemia: Secondary | ICD-10-CM | POA: Diagnosis not present

## 2021-08-02 LAB — LIPID PANEL
Chol/HDL Ratio: 1.4 ratio (ref 0.0–5.0)
Cholesterol, Total: 146 mg/dL (ref 100–199)
HDL: 105 mg/dL (ref >39)
LDL Chol Calc (NIH): 27 mg/dL (ref 0–99)
Triglycerides: 75 mg/dL (ref 0–149)
VLDL Cholesterol Cal: 14 mg/dL (ref 5–40)

## 2021-08-02 LAB — APOLIPOPROTEIN B: Apolipoprotein B: 33 mg/dL (ref <90)

## 2021-08-12 DIAGNOSIS — E113311 Type 2 diabetes mellitus with moderate nonproliferative diabetic retinopathy with macular edema, right eye: Secondary | ICD-10-CM | POA: Diagnosis not present

## 2021-08-24 DIAGNOSIS — E113312 Type 2 diabetes mellitus with moderate nonproliferative diabetic retinopathy with macular edema, left eye: Secondary | ICD-10-CM | POA: Diagnosis not present

## 2021-09-04 ENCOUNTER — Encounter: Payer: Self-pay | Admitting: Adult Health

## 2021-09-05 ENCOUNTER — Encounter: Payer: Self-pay | Admitting: Internal Medicine

## 2021-09-05 ENCOUNTER — Ambulatory Visit: Payer: Medicare PPO | Admitting: Internal Medicine

## 2021-09-05 VITALS — BP 142/81 | HR 67 | Temp 99.0°F | Wt 169.0 lb

## 2021-09-05 DIAGNOSIS — R319 Hematuria, unspecified: Secondary | ICD-10-CM | POA: Diagnosis not present

## 2021-09-05 LAB — POCT URINALYSIS DIPSTICK
Bilirubin, UA: NEGATIVE
Blood, UA: NEGATIVE
Glucose, UA: NEGATIVE
Ketones, UA: NEGATIVE
Nitrite, UA: POSITIVE
Protein, UA: POSITIVE — AB
Spec Grav, UA: 1.015 (ref 1.010–1.025)
Urobilinogen, UA: 0.2 E.U./dL
pH, UA: 5.5 (ref 5.0–8.0)

## 2021-09-05 MED ORDER — SULFAMETHOXAZOLE-TRIMETHOPRIM 800-160 MG PO TABS: 1.0000 | ORAL_TABLET | Freq: Two times a day (BID) | ORAL | 0 refills | Status: AC

## 2021-09-05 NOTE — Telephone Encounter (Signed)
Appt scheduled for 09/05/21

## 2021-09-05 NOTE — Progress Notes (Signed)
Acute office Visit     CC/Reason for Visit: Hematuria  HPI: [name] is a 76 y.o. male who is coming in today for the above mentioned reasons.  In January had an episode of gross hematuria.  He visited urology and I can see where he had a CT as well as a cystoscopy.  There was some bladder thickening and prostate hyperplasia and he was placed on tamsulosin without further incidents.  He states 2 days ago he noticed the resurgence of hematuria.,  Also foul odor of urine.  Other than that no complaints.  He has not contacted his urology office.  Past Medical/Surgical History: 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   Blood transfusion without reported diagnosis    Carpal tunnel syndrome 09/24/2014   Bilateral   Coronary artery disease    Multivessel status post CABG September 2017   COVID-19    Diabetes mellitus, type 2 ([place])    Erectile dysfunction    Essential hypertension    Hyperlipidemia    Palpitations    Cardiac monitor 03/2019: normal sinus rhythm, no AF, rare NSVT, VT    Past Surgical History:  Procedure Laterality Date   AORTIC VALVE REPLACEMENT N/A 10/29/2015   Procedure: AORTIC VALVE REPLACEMENT (AVR) WITH 23MM MAGNA EASE TISSUE VALVE.;  Surgeon: [name], MD;  Location: [facility];  Service: Open Heart Surgery;  Laterality: N/A;   CARDIAC CATHETERIZATION N/A 10/27/2015   Procedure: Left Heart Cath and Coronary Angiography;  Surgeon: [name], MD;  Location: [facility];  Service: Cardiovascular;  Laterality: N/A;   COLONOSCOPY  09/02/2015   Dr.Pyrtle   CORONARY ARTERY BYPASS GRAFT N/A 10/29/2015   Procedure: CORONARY ARTERY BYPASS GRAFTING (CABG) x Four UTILIZING THE LEFT INTERNAL MAMMARY ARTERY AND ENDOSCOPICALLY HARVESTED RIGHT SAPEHENEOUS VEINS.;  Surgeon: [name], MD;  Location: [facility];  Service: Open Heart Surgery;  Laterality: N/A;   CYST REMOVAL HAND Right     ESOPHAGOGASTRODUODENOSCOPY N/A 12/02/2015   Procedure: ESOPHAGOGASTRODUODENOSCOPY (EGD);  Surgeon: [name], MD;  Location: [facility];  Service: Gastroenterology;  Laterality: N/A;   REPLACEMENT ASCENDING AORTA N/A 10/29/2015   Procedure: REPLACEMENT OF ASCENDING AORTA USING 34MM X 30CM WOVEN DOUBLE VELOUR VASCULAR GRAFT.;  Surgeon: [name], MD;  Location: [facility];  Service: Open Heart Surgery;  Laterality: N/A;   TEE WITHOUT CARDIOVERSION N/A 10/29/2015   Procedure: TRANSESOPHAGEAL ECHOCARDIOGRAM (TEE);  Surgeon: [name], MD;  Location: [facility];  Service: Open Heart Surgery;  Laterality: N/A;   UVULECTOMY N/A 12/10/2015   Procedure: UVULECTOMY;  Surgeon: [name], MD;  Location: [facility];  Service: ENT;  Laterality: N/A;    Social History:  reports that he has never smoked. He has never used smokeless tobacco. He reports current alcohol use. He reports that he does not use drugs.  Allergies: Allergies  Allergen Reactions   Amiodarone Nausea And Vomiting   Quinolones     Patient was warned about not using Cipro and similar antibiotics. Recent studies have raised concern that fluoroquinolone antibiotics could be associated with an increased risk of aortic aneurysm Fluoroquinolones have non-antimicrobial properties that might jeopardise the integrity of the extracellular matrix of the vascular wall In a  propensity score matched cohort study in [place], there was a 66% increased rate of aortic aneurysm or dissection associated with oral fluoroquinolone use, compared wit  Family History:  Family History  Problem Relation Age of Onset   Cancer Father        Throat cancer   Diabetes Sister    Colon cancer Neg Hx      Current Outpatient Medications:    Accu-Chek Softclix Lancets lancets, Use as instructed, Disp: 100 each, Rfl: 12   aspirin EC 81 MG tablet, Take 81 mg by mouth daily., Disp: , Rfl:    Blood Glucose Monitoring Suppl (ACCU-CHEK GUIDE ME)  w/Device KIT, 1 Stick by Does not apply route 3 (three) times daily. Used to check blood sugar 3 times daily, Disp: 1 kit, Rfl: 0   Continuous Blood Gluc Sensor (FREESTYLE LIBRE 2 SENSOR) MISC, 1 Device by Does not apply route every 14 (fourteen) days., Disp: 2 each, Rfl: 6   Evolocumab (REPATHA SURECLICK) 749 MG/ML SOAJ, Inject 1 pen. into the skin every 14 (fourteen) days., Disp: 6 mL, Rfl: 3   glipiZIDE (GLUCOTROL XL) 10 MG 24 hr tablet, TAKE 1 TABLET (10 MG TOTAL) BY MOUTH IN THE MORNING AND AT BEDTIME, Disp: 180 tablet, Rfl: 1   glucose blood (ACCU-CHEK GUIDE) test strip, Use as instructed, Disp: 100 each, Rfl: 1   hydrochlorothiazide (HYDRODIURIL) 25 MG tablet, Take 1 tablet (25 mg total) by mouth daily., Disp: 90 tablet, Rfl: 3   Insulin Pen Needle (B-D ULTRAFINE III SHORT PEN) 31G X 8 MM MISC, Use BID, Disp: 200 each, Rfl: 3   metFORMIN (GLUCOPHAGE) 1000 MG tablet, TAKE 1 TABLET (1,000 MG TOTAL) BY MOUTH 2 (TWO) TIMES DAILY WITH A MEAL., Disp: 180 tablet, Rfl: 0   metoprolol tartrate (LOPRESSOR) 25 MG tablet, TAKE 1 TABLET BY MOUTH TWICE A DAY, Disp: 180 tablet, Rfl: 2   rosuvastatin (CRESTOR) 40 MG tablet, TAKE 1 TABLET BY MOUTH EVERYDAY AT BEDTIME, Disp: 90 tablet, Rfl: 1   sulfamethoxazole-trimethoprim (BACTRIM DS) 800-160 MG tablet, Take 1 tablet by mouth 2 (two) times daily for 7 days., Disp: 14 tablet, Rfl: 0   tamsulosin (FLOMAX) 0.4 MG CAPS capsule, Take 1 capsule (0.4 mg total) by mouth daily., Disp: 90 capsule, Rfl: 3   TRULICITY 4.49 QP/5.9FM SOPN, INJECT 0.75 MG SUBCUTANEOUSLY ONE TIME PER WEEK, Disp: 1 mL, Rfl: 2   insulin glargine (LANTUS SOLOSTAR) 100 UNIT/ML Solostar Pen, Inject 22 Units into the skin 2 (two) times daily., Disp: 40 mL, Rfl: 0  Review of Systems:  Constitutional: Denies fever, chills, diaphoresis, appetite change and fatigue.  HEENT: Denies photophobia, eye pain, redness, hearing loss, ear pain, congestion, sore throat, rhinorrhea, sneezing, mouth sores,  trouble swallowing, neck pain, neck stiffness and tinnitus.   Respiratory: Denies SOB, DOE, cough, chest tightness,  and wheezing.   Cardiovascular: Denies chest pain, palpitations and leg swelling.  Gastrointestinal: Denies nausea, vomiting, abdominal pain, diarrhea, constipation, blood in stool and abdominal distention.  Genitourinary: Denies dysuria, urgency, f, flank pain and difficulty urinating.  Endocrine: Denies: hot or cold intolerance, sweats, changes in hair or nails, polyuria, polydipsia. Musculoskeletal: Denies myalgias, back pain, joint swelling, arthralgias and gait problem.  Skin: Denies pallor, rash and wound.  Neurological: Denies dizziness, seizures, syncope, weakness, light-headedness, numbness and headaches.  Hematological: Denies adenopathy. Easy bruising, personal or family bleeding history  Psychiatric/Behavioral: Denies suicidal ideation, mood changes, confusion, nervousness, sleep disturbance and agitation    Physical Exam: Vitals:   09/05/21 1404 09/05/21 1407  BP: 140/80 (!) 142/81  Pulse: 67   Temp: 99 F (37.2 C)   TempSrc: Oral   SpO2: 99%  Weight: 169 lb (76.7 kg)     Body mass index is 23.91 kg/m.   Constitutional: NAD, calm, comfortable Eyes: PERRL, lids and conjunctivae normal ENMT: Mucous membranes are moist.   Psychiatric: Normal judgment and insight. Alert and oriented x 3. Normal mood.    Impression and Plan:  Hematuria, unspecified type  - Plan: POCT urinalysis dipstick, Urine Culture, sulfamethoxazole-trimethoprim (BACTRIM DS) 800-160 MG tablet -In office urine dipstick with blood, nitrates and leukocytes indicative of UTI. -Start Bactrim DS for 7 days, he continues to take tamsulosin.  Have advised that he contact urology office to see if further work-up is mandated.  Urine culture has been sent.    Time spent:30 minutes reviewing chart, interviewing and examining patient and formulating plan of care.    Lelon Frohlich, MD [facility]

## 2021-09-07 LAB — URINE CULTURE
MICRO NUMBER:: 13685649
SPECIMEN QUALITY:: ADEQUATE

## 2021-09-14 ENCOUNTER — Other Ambulatory Visit: Payer: Self-pay | Admitting: Adult Health

## 2021-09-23 ENCOUNTER — Encounter: Payer: Self-pay | Admitting: *Deleted

## 2021-09-23 ENCOUNTER — Other Ambulatory Visit: Payer: Self-pay | Admitting: *Deleted

## 2021-09-23 ENCOUNTER — Telehealth: Payer: Self-pay

## 2021-09-23 DIAGNOSIS — E119 Type 2 diabetes mellitus without complications: Secondary | ICD-10-CM

## 2021-09-23 NOTE — Patient Outreach (Signed)
  Care Coordination   Initial Visit Note   09/23/2021 Name: ROCKY GLADDEN MRN: [id number] DOB: 1945/11/02  [name] is a 76 y.o. year old male who sees Nafziger, Tommi Rumps, NP for primary care. I spoke with  [name] by phone today  What matters to the patients health and wellness today?  AWV/ADULT DAYCARE/PACE    Goals Addressed               This Visit's Progress     AWV/Adult Days/PACE (pt-stated)        Care Coordination Interventions: Reviewed medications with patient and discussed purpose of all medications Reviewed scheduled/upcoming provider appointments including all pending appointments and next AWV on 02/21/2022 with PCP Care Guide referral for Adult Day/PACE Screening for signs and symptoms of depression related to chronic disease state  Assessed social determinant of health barriers         SDOH assessments and interventions completed:  Yes  SDOH Interventions Today    Flowsheet Row Most Recent Value  SDOH Interventions   Food Insecurity Interventions Intervention Not Indicated  Housing Interventions Intervention Not Indicated  Transportation Interventions Intervention Not Indicated        Care Coordination Interventions Activated:  Yes  Care Coordination Interventions:  Yes, provided   Follow up plan: No further intervention required.   Encounter Outcome:  Pt. Visit Completed   Raina Mina, RN Care Management Coordinator [place] Office [phone]

## 2021-09-23 NOTE — Patient Instructions (Signed)
Visit Information  Thank you for taking time to visit with me today. Please don't hesitate to contact me if I can be of assistance to you.   Following are the goals we discussed today:   Goals Addressed               This Visit's Progress     AWV/Adult Days/PACE (pt-stated)        Care Coordination Interventions: Reviewed medications with patient and discussed purpose of all medications Reviewed scheduled/upcoming provider appointments including all pending appointments and next AWV on 02/21/2022 with PCP Care Guide referral for Adult Day/PACE Screening for signs and symptoms of depression related to chronic disease state  Assessed social determinant of health barriers          Please call the care guide team at [phone] if you need to cancel or reschedule your appointment.   If you are experiencing a Mental Health or [place] or need someone to talk to, please call the Suicide and Crisis Lifeline: 988  Patient verbalizes understanding of instructions and care plan provided today and agrees to view in [place]. Active MyChart status and patient understanding of how to access instructions and care plan via MyChart confirmed with patient.     No further follow up required: No further needs  Raina Mina, RN Care Management Coordinator [place] Office [phone]

## 2021-09-23 NOTE — Telephone Encounter (Signed)
   Telephone encounter was:  Unsuccessful.  09/23/2021 Name: [name] MRN: [id number] DOB: 11-02-1945  Unsuccessful outbound call made today to assist with:   adult daycare  Outreach Attempt:  1st Attempt  A HIPAA compliant voice message was left requesting a return call.  Instructed patient to call back at  earliest convenience. [place], Care Management  [phone] [address], [place], [place] [address] Phone: [phone] Email: [email or web address]

## 2021-09-29 ENCOUNTER — Telehealth: Payer: Self-pay

## 2021-09-29 NOTE — Telephone Encounter (Signed)
   Telephone encounter was:  Unsuccessful.  09/29/2021 Name: [name] MRN: [id number] DOB: 1945/04/26  Unsuccessful outbound call made today to assist with:   Adult daycare  Outreach Attempt:  2nd Attempt  A HIPAA compliant voice message was left requesting a return call.  Instructed patient to call back at  earliest convenience.[place], Care Management  [phone] [address], [place], [place] [address] Phone: [phone] Email: [email or web address]

## 2021-09-30 ENCOUNTER — Telehealth: Payer: Self-pay

## 2021-09-30 NOTE — Telephone Encounter (Signed)
    Telephone encounter was:  Unsuccessful.  09/30/2021 Name: [name] MRN: [id number] DOB: 1945-03-21  Unsuccessful outbound call made today to assist with:   ADULT DAY CARE  Outreach Attempt:  3rd Attempt.  Referral closed unable to contact patient.  A HIPAA compliant voice message was left requesting a return call.  Instructed patient to call back at earliest convenience.[place], Care Management  [phone] [address], [place], [place] [address] Phone: [phone] Email: [email or web address]

## 2021-10-10 ENCOUNTER — Other Ambulatory Visit: Payer: Self-pay

## 2021-10-10 ENCOUNTER — Encounter (HOSPITAL_BASED_OUTPATIENT_CLINIC_OR_DEPARTMENT_OTHER): Payer: Self-pay

## 2021-10-10 ENCOUNTER — Emergency Department (HOSPITAL_BASED_OUTPATIENT_CLINIC_OR_DEPARTMENT_OTHER)
Admission: EM | Admit: 2021-10-10 | Discharge: 2021-10-10 | Disposition: A | Discharge status: Home / Self Care | Payer: Medicare PPO | Attending: Emergency Medicine | Admitting: Emergency Medicine

## 2021-10-10 ENCOUNTER — Emergency Department (HOSPITAL_BASED_OUTPATIENT_CLINIC_OR_DEPARTMENT_OTHER): Payer: Medicare PPO | Admitting: Radiology

## 2021-10-10 DIAGNOSIS — I10 Essential (primary) hypertension: Secondary | ICD-10-CM | POA: Insufficient documentation

## 2021-10-10 DIAGNOSIS — Z794 Long term (current) use of insulin: Secondary | ICD-10-CM | POA: Diagnosis not present

## 2021-10-10 DIAGNOSIS — E119 Type 2 diabetes mellitus without complications: Secondary | ICD-10-CM | POA: Insufficient documentation

## 2021-10-10 DIAGNOSIS — I2510 Atherosclerotic heart disease of native coronary artery without angina pectoris: Secondary | ICD-10-CM | POA: Diagnosis not present

## 2021-10-10 DIAGNOSIS — R059 Cough, unspecified: Secondary | ICD-10-CM | POA: Diagnosis not present

## 2021-10-10 DIAGNOSIS — Z7984 Long term (current) use of oral hypoglycemic drugs: Secondary | ICD-10-CM | POA: Diagnosis not present

## 2021-10-10 DIAGNOSIS — R079 Chest pain, unspecified: Secondary | ICD-10-CM | POA: Diagnosis not present

## 2021-10-10 DIAGNOSIS — U071 COVID-19: Secondary | ICD-10-CM | POA: Insufficient documentation

## 2021-10-10 DIAGNOSIS — Z7982 Long term (current) use of aspirin: Secondary | ICD-10-CM | POA: Diagnosis not present

## 2021-10-10 DIAGNOSIS — Z79899 Other long term (current) drug therapy: Secondary | ICD-10-CM | POA: Diagnosis not present

## 2021-10-10 DIAGNOSIS — J029 Acute pharyngitis, unspecified: Secondary | ICD-10-CM | POA: Diagnosis not present

## 2021-10-10 LAB — SARS CORONAVIRUS 2 BY RT PCR: SARS Coronavirus 2 by RT PCR: POSITIVE — AB

## 2021-10-10 MED ORDER — MOLNUPIRAVIR EUA 200MG CAPSULE: 4.0000 | ORAL_CAPSULE | Freq: Two times a day (BID) | ORAL | 0 refills | Status: AC

## 2021-10-10 MED ORDER — ACETAMINOPHEN 325 MG PO TABS
650.0000 mg | ORAL_TABLET | Freq: Once | ORAL | Status: AC | PRN
  Administered 2021-10-10: 650 mg via ORAL
  Filled 2021-10-10: qty 2

## 2021-10-10 MED ORDER — NIRMATRELVIR/RITONAVIR (PAXLOVID) TABLET (RENAL DOSING): 2.0000 | ORAL_TABLET | Freq: Two times a day (BID) | ORAL | 0 refills | Status: DC

## 2021-10-10 MED ORDER — ONDANSETRON HCL 4 MG PO TABS: 4.0000 mg | ORAL_TABLET | Freq: Four times a day (QID) | ORAL | 0 refills | Status: DC

## 2021-10-10 NOTE — ED Provider Notes (Signed)
[facility] EMERGENCY DEPT Provider Note   CSN: [id number] Arrival date & time: 10/10/21  1743     History  Chief Complaint  Patient presents with   Sore Throat    [name] is a 76 y.o. male.  With past medical history of coronary artery disease s/p coronary artery bypass in 2017, type 2 diabetes, hypertension who presents to the emergency department with sore throat.  Presents with family.  He states that symptoms began on Sunday.  He describes having scratchy and sore throat, body aches, nonproductive cough, subjective fever and having nausea with an episode of vomiting.  States that he does feel somewhat short of breath but has had no difficulty walking around the house or laying flat.  He has been using Tylenol for relief of symptoms.  Has still been tolerating p.o.  Denies chest pain, palpitations.  Took home COVID test and it was positive so we presented here.    Sore Throat Associated symptoms include shortness of breath. Pertinent negatives include no chest pain.       Home Medications Prior to Admission medications   Medication Sig Start Date End Date Taking? Authorizing Provider  molnupiravir EUA (LAGEVRIO) 200 mg CAPS capsule Take 4 capsules (800 mg total) by mouth 2 (two) times daily for 5 days. 10/10/21 10/15/21 Yes Mickie Hillier, PA-C  ondansetron (ZOFRAN) 4 MG tablet Take 1 tablet (4 mg total) by mouth every 6 (six) hours. 10/10/21  Yes Mickie Hillier, PA-C  Accu-Chek Softclix Lancets lancets Use as instructed 01/18/21   [name], NP  aspirin EC 81 MG tablet Take 81 mg by mouth daily.    [provider]  Blood Glucose Monitoring Suppl (ACCU-CHEK GUIDE ME) w/Device KIT 1 Stick by Does not apply route 3 (three) times daily. Used to check blood sugar 3 times daily 03/06/19   Nafziger, Tommi Rumps, NP  Continuous Blood Gluc Sensor (FREESTYLE LIBRE 2 SENSOR) MISC 1 Device by Does not apply route every 14 (fourteen) days. 04/19/21   Nafziger, Tommi Rumps,  NP  Evolocumab (REPATHA SURECLICK) 646 MG/ML SOAJ Inject 1 pen. into the skin every 14 (fourteen) days. 05/03/21   [name], [name], MD  glipiZIDE (GLUCOTROL XL) 10 MG 24 hr tablet TAKE 1 TABLET (10 MG TOTAL) BY MOUTH IN THE MORNING AND AT BEDTIME 07/25/21   Nafziger, Tommi Rumps, NP  glucose blood (ACCU-CHEK GUIDE) test strip Use as instructed 01/18/21   Nafziger, Tommi Rumps, NP  hydrochlorothiazide (HYDRODIURIL) 25 MG tablet Take 1 tablet (25 mg total) by mouth daily. 04/19/21   Nafziger, Tommi Rumps, NP  insulin glargine (LANTUS SOLOSTAR) 100 UNIT/ML Solostar Pen Inject 22 Units into the skin 2 (two) times daily. 04/19/21 07/18/21  Nafziger, Tommi Rumps, NP  Insulin Pen Needle (B-D ULTRAFINE III SHORT PEN) 31G X 8 MM MISC Use BID 01/18/21   Nafziger, Tommi Rumps, NP  metFORMIN (GLUCOPHAGE) 1000 MG tablet TAKE 1 TABLET (1,000 MG TOTAL) BY MOUTH 2 (TWO) TIMES DAILY WITH A MEAL. 02/18/21   Nafziger, Tommi Rumps, NP  metoprolol tartrate (LOPRESSOR) 25 MG tablet TAKE 1 TABLET BY MOUTH TWICE A DAY 07/14/21   [name], [name], MD  rosuvastatin (CRESTOR) 40 MG tablet TAKE 1 TABLET BY MOUTH EVERYDAY AT BEDTIME 09/29/20   Nafziger, Tommi Rumps, NP  tamsulosin (FLOMAX) 0.4 MG CAPS capsule Take 1 capsule (0.4 mg total) by mouth daily. 07/29/21   Stoioff, Ronda Fairly, MD  TRULICITY 8.03 OZ/2.2QM SOPN INJECT 0.75 MG SUBCUTANEOUSLY ONE TIME PER WEEK 09/16/21   [name], NP  Allergies    Amiodarone and Quinolones    Review of Systems   Review of Systems  Constitutional:  Positive for fever.  HENT:  Positive for congestion and sore throat.   Respiratory:  Positive for cough and shortness of breath.   Cardiovascular:  Negative for chest pain, palpitations and leg swelling.  Gastrointestinal:  Positive for nausea and vomiting.  Musculoskeletal:  Positive for myalgias.  All other systems reviewed and are negative.   Physical Exam Updated Vital Signs BP 134/62 (BP Location: Right Arm)   Pulse 77   Temp 98.7 F (37.1 C) (Oral)   Resp 16   Ht 5' 10.5" (1.791  m)   Wt 76.7 kg   SpO2 100%   BMI 23.92 kg/m  Physical Exam Vitals and nursing note reviewed.  Constitutional:      General: He is not in acute distress.    Appearance: Normal appearance. He is well-developed. He is ill-appearing. He is not toxic-appearing.  HENT:     Head: Normocephalic and atraumatic.     Nose: Congestion present.     Mouth/Throat:     Mouth: Mucous membranes are moist.     Pharynx: Uvula midline. Posterior oropharyngeal erythema present. No pharyngeal swelling.     Tonsils: No tonsillar exudate or tonsillar abscesses. 0 on the right. 0 on the left.  Eyes:     General: No scleral icterus.    Conjunctiva/sclera: Conjunctivae normal.     Pupils: Pupils are equal, round, and reactive to light.  Cardiovascular:     Rate and Rhythm: Normal rate and regular rhythm.     Heart sounds: Normal heart sounds. No murmur heard. Pulmonary:     Effort: Pulmonary effort is normal. No respiratory distress.     Breath sounds: Normal breath sounds. No wheezing, rhonchi or rales.  Abdominal:     General: Bowel sounds are normal. There is no distension.     Palpations: Abdomen is soft.     Tenderness: There is no abdominal tenderness.  Musculoskeletal:        General: Normal range of motion.     Cervical back: Normal range of motion and neck supple.  Skin:    General: Skin is warm and dry.     Capillary Refill: Capillary refill takes less than 2 seconds.  Neurological:     General: No focal deficit present.     Mental Status: He is alert and oriented to person, place, and time. Mental status is at baseline.  Psychiatric:        Mood and Affect: Mood normal.        Behavior: Behavior normal.        Thought Content: Thought content normal.        Judgment: Judgment normal.     ED Results / Procedures / Treatments   Labs (all labs ordered are listed, but only abnormal results are displayed) Labs Reviewed  SARS CORONAVIRUS 2 BY RT PCR - Abnormal; Notable for the following  components:      Result Value   SARS Coronavirus 2 by RT PCR POSITIVE (*)    All other components within normal limits    EKG EKG Interpretation  Date/Time:  Monday October 10 2021 18:15:38 EDT Ventricular Rate:  101 PR Interval:  164 QRS Duration: 86 QT Interval:  318 QTC Calculation: 412 R Axis:   47 Text Interpretation: Sinus tachycardia Nonspecific ST and T wave abnormality Abnormal ECG When compared with ECG of 23-Mar-2020 15:05, PREVIOUS  ECG IS PRESENT Similar to previous No significant change since last tracing Confirmed by Georgina Snell [phone]) on 10/10/2021 9:10:25 PM  Radiology DG Chest 2 View  Result Date: 10/10/2021 CLINICAL DATA:  Sore throat, back pain, and dry cough beginning last night. Positive home COVID-19 test. EXAM: CHEST - 2 VIEW COMPARISON:  AP chest 03/23/2020 FINDINGS: Status post median sternotomy. Cardiac silhouette and mediastinal contours within normal limits. Postsurgical changes aortic valve prosthesis and CABG. Minimal linear scarring within the left mid chest is unchanged. No pleural effusion or pneumothorax. Mild-to-moderate multilevel degenerative disc changes of the thoracic spine. IMPRESSION: Stable postsurgical changes and minimal left mid lung scarring. No acute lung process. Electronically Signed   By: Yvonne Kendall M.D.   On: 10/10/2021 18:40    Procedures Procedures   Medications Ordered in ED Medications  acetaminophen (TYLENOL) tablet 650 mg (650 mg Oral Given 10/10/21 1808)    ED Course/ Medical Decision Making/ A&P                           Medical Decision Making Amount and/or Complexity of Data Reviewed Radiology: ordered.  Risk OTC drugs. Prescription drug management.   This patient presents to the ED with chief complaint(s) of cough and fever with pertinent past medical history of coronary artery disease, hypertension, diabetes which further complicates the presenting complaint. The complaint involves an extensive  differential diagnosis and also carries with it a high risk of complications and morbidity.    The differential diagnosis includes pneumonia, viral upper respiratory infection, congestive heart failure, PE, myocarditis, pericarditis, sepsis etc.  Additional history obtained: Additional history obtained from family Records reviewed Care Everywhere/External Records  ED Course and Reassessment: 76 year old male who presents to the emergency department with cough and fever. He is ill-appearing but nontoxic in appearance.  He is in no acute respiratory distress.  He is not hypoxic.  His vitals are stable. On arrival he was febrile and slightly tachycardic.  Feel that his tachycardia is related to his fever.  He was given Tylenol and defervesced.  His heart rate improved after this. Physical exam is unremarkable.  He does have some oropharyngeal erythema without tonsillar swelling or exudates.  His lungs are clear bilaterally.  Chest x-ray without evidence of pneumonia, pleural effusion, pneumothorax.  EKG without obvious ischemia or infarction.  He is COVID-positive here.  His symptoms are consistent with other etiologies such as myocarditis, pericarditis, congestive heart failure, PE.  Given that he is on a statin we will prescribe him with Molnupiravir for [place].  We will also prescribe him Zofran for his episode of nausea and vomiting.  He is given strict return precautions for any worsening symptoms.  He does feel somewhat short of breath but he is maintaining his airway, without tachypnea, hypoxia.  Will return for worsening symptoms.  Otherwise feel that he is safe for discharge at this time.  Independent labs interpretation:  The following labs were independently interpreted: COVID-positive  Independent visualization of imaging: - I independently visualized the following imaging with scope of interpretation limited to determining acute life threatening conditions related to emergency care:  Chest x-ray, which revealed no pneumonia, pleural effusion or pneumothorax  Consultation: - Consulted or discussed management/test interpretation w/ external professional: Indicated  Consideration for admission or further workup: Not indicated.  Can consider further work-up or admission given his age and comorbidities however he is nontoxic in appearance.  He is not hypoxic or in respiratory  distress.  Feel that he is safe and appropriate for outpatient management of his symptoms. Social Determinants of health: None identified Final Clinical Impression(s) / ED Diagnoses Final diagnoses:  COVID-19    Rx / DC Orders ED Discharge Orders          Ordered    nirmatrelvir/ritonavir EUA, renal dosing, (PAXLOVID) 10 x 150 MG & 10 x 100MG TABS  2 times daily,   Status:  Discontinued        10/10/21 2108    molnupiravir EUA (LAGEVRIO) 200 mg CAPS capsule  2 times daily        10/10/21 2112    ondansetron (ZOFRAN) 4 MG tablet  Every 6 hours        10/10/21 2112              Mickie Hillier, PA-C 10/10/21 2138    Elgie Congo, MD 10/11/21 [phone]

## 2021-10-10 NOTE — Discharge Instructions (Addendum)
You were seen in the emergency department today for cough, fever, and body aches.  You have COVID-19.  We are placing you on a medication called Molnupiravir which is for COVID.  You will take 4 tablets twice a day over the next 5 days.  I am also prescribing you Zofran for nausea.  Please continue drink plenty of fluids like water and Gatorade.  Please eat small meals and rest.  Please return if you have worsening shortness of breath or difficulty breathing.

## 2021-10-10 NOTE — ED Notes (Signed)
Discharge paperwork given and verbally understood. 

## 2021-10-10 NOTE — ED Triage Notes (Signed)
Patient here POV from Home.  Endorses Sore Throat, Back Pain, Dry Cough that began Last PM.   Home COVID-19 Test Positive Today. No Antipyretics Today.   NAD Noted during Triage. A&Ox4. GCS 15. Ambulatory.

## 2021-10-19 DIAGNOSIS — E113311 Type 2 diabetes mellitus with moderate nonproliferative diabetic retinopathy with macular edema, right eye: Secondary | ICD-10-CM | POA: Diagnosis not present

## 2021-10-21 DIAGNOSIS — E113312 Type 2 diabetes mellitus with moderate nonproliferative diabetic retinopathy with macular edema, left eye: Secondary | ICD-10-CM | POA: Diagnosis not present

## 2021-11-03 DIAGNOSIS — E1169 Type 2 diabetes mellitus with other specified complication: Secondary | ICD-10-CM

## 2021-11-25 MED ORDER — ACCU-CHEK GUIDE VI STRP: ORAL_STRIP | 1 refills | Status: DC

## 2021-11-29 ENCOUNTER — Encounter: Payer: Self-pay | Admitting: Adult Health

## 2021-11-29 ENCOUNTER — Ambulatory Visit (INDEPENDENT_AMBULATORY_CARE_PROVIDER_SITE_OTHER): Payer: Medicare PPO | Admitting: Adult Health

## 2021-11-29 VITALS — BP 132/80 | HR 72 | Temp 98.2°F | Wt 171.0 lb

## 2021-11-29 DIAGNOSIS — E1169 Type 2 diabetes mellitus with other specified complication: Secondary | ICD-10-CM | POA: Diagnosis not present

## 2021-11-29 DIAGNOSIS — Z794 Long term (current) use of insulin: Secondary | ICD-10-CM | POA: Diagnosis not present

## 2021-11-29 DIAGNOSIS — I10 Essential (primary) hypertension: Secondary | ICD-10-CM | POA: Diagnosis not present

## 2021-11-29 LAB — POCT GLYCOSYLATED HEMOGLOBIN (HGB A1C): Hemoglobin A1C: 5.7 % — AB (ref 4.0–5.6)

## 2021-11-29 MED ORDER — FREESTYLE LIBRE 2 SENSOR MISC: 1.0000 | 6 refills | Status: DC

## 2021-11-29 NOTE — Progress Notes (Signed)
Subjective:    Patient ID: [name], male    DOB: October 09, 1945, 76 y.o.   MRN: [id number]  HPI 76 year old male who  has a past medical history of Aortic stenosis, Blood transfusion without reported diagnosis, Carpal tunnel syndrome (09/24/2014), Coronary artery disease, COVID-19, Diabetes mellitus, type 2 ([place]), Erectile dysfunction, Essential hypertension, Hyperlipidemia, and Palpitations.  He presents to the office today for follow-up regarding diabetes and hypertension  DM Type 2 -currently managed with metformin 1000 mg twice daily(has not been taking for the last 2 months due to feeling nauseated), glipizide 10 mg twice daily, and Trulicity 2.83 ( has not been using bc he has not been able to get it from the pharmacy for the last month, this medication is also 200$ for him), and Lantus 22 units subcu twice daily.  He has been checking his blood sugars more frequently with readings in the 120s to 150s. He denies hypoglycemia episodes. He is no longer eating carbs.   His last A1c was 8.5 in March 2023   He would like to use the [facility] system   Hypertension-managed with HCTZ 25 mg daily and Lopressor 25 mg twice daily.  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   Blood transfusion without reported diagnosis    Carpal tunnel syndrome 09/24/2014   Bilateral   Coronary artery disease    Multivessel status post CABG September 2017   COVID-19    Diabetes mellitus, type 2 ([place])    Erectile dysfunction    Essential hypertension    Hyperlipidemia    Palpitations    Cardiac monitor 03/2019: normal sinus rhythm, no AF, rare NSVT, VT    Social History   Socioeconomic History   Marital status: Widowed    Spouse name: Not  on file   Number of children: 5   Years of education: 10    Highest education level: 10th grade  Occupational History   Occupation: custodian    Comment: PT  Tobacco Use   Smoking status: Never   Smokeless tobacco: Never  Vaping Use   Vaping Use: Never used  Substance and Sexual Activity   Alcohol use: Yes    Comment: 1 x month   Drug use: No   Sexual activity: Yes  Other Topics Concern   Not on file  Social History Narrative   Widowed to [name] for 46 years; 2 daughters liver with him now   Works part time as Chiropodist   5 children; 1 son deceased   Never Smoked   Alcohol use- no   Caffeine use: occasional       Lost one child a few months ago    71 his sister, then son and then nephew - all within 2 weeks apart   Son had a stroke at 13    Was planning to get married last month in Feb.    Social Determinants of Health   Financial Resource Strain: [place]  (02/18/2021)   Overall Financial Resource Strain (CARDIA)    Difficulty of Paying Living Expenses: Not hard at all  Food Insecurity: No Food Insecurity (09/23/2021)   Hunger Vital Sign    Worried About Running Out of  Food in the Last Year: Never true    [place] in the Last Year: Never true  Transportation Needs: No Transportation Needs (09/23/2021)   PRAPARE - Hydrologist (Medical): No    Lack of Transportation (Non-Medical): No  Physical Activity: Sufficiently Active (02/18/2021)   Exercise Vital Sign    Days of Exercise per Week: 5 days    Minutes of Exercise per Session: 30 min  Stress: No Stress Concern Present (02/18/2021)   [place]    Feeling of Stress : Not at all  Social Connections: Moderately Integrated (02/18/2021)   Social Connection and Isolation Panel [NHANES]    Frequency of Communication with Friends and Family: More than three times a week    Frequency of Social Gatherings with  Friends and Family: More than three times a week    Attends Religious Services: More than 4 times per year    Active Member of [employer] or Organizations: Yes    Attends Archivist Meetings: More than 4 times per year    Marital Status: Widowed  Intimate Partner Violence: Not At Risk (09/23/2021)   Humiliation, Afraid, Rape, and Kick questionnaire    Fear of Current or Ex-Partner: No    Emotionally Abused: No    Physically Abused: No    Sexually Abused: No    Past Surgical History:  Procedure Laterality Date   AORTIC VALVE REPLACEMENT N/A 10/29/2015   Procedure: AORTIC VALVE REPLACEMENT (AVR) WITH 23MM MAGNA EASE TISSUE VALVE.;  Surgeon: [name], MD;  Location: [facility];  Service: Open Heart Surgery;  Laterality: N/A;   CARDIAC CATHETERIZATION N/A 10/27/2015   Procedure: Left Heart Cath and Coronary Angiography;  Surgeon: [name], MD;  Location: [facility];  Service: Cardiovascular;  Laterality: N/A;   COLONOSCOPY  09/02/2015   Dr.Pyrtle   CORONARY ARTERY BYPASS GRAFT N/A 10/29/2015   Procedure: CORONARY ARTERY BYPASS GRAFTING (CABG) x Four UTILIZING THE LEFT INTERNAL MAMMARY ARTERY AND ENDOSCOPICALLY HARVESTED RIGHT SAPEHENEOUS VEINS.;  Surgeon: [name], MD;  Location: [facility];  Service: Open Heart Surgery;  Laterality: N/A;   CYST REMOVAL HAND Right    ESOPHAGOGASTRODUODENOSCOPY N/A 12/02/2015   Procedure: ESOPHAGOGASTRODUODENOSCOPY (EGD);  Surgeon: [name], MD;  Location: [facility];  Service: Gastroenterology;  Laterality: N/A;   REPLACEMENT ASCENDING AORTA N/A 10/29/2015   Procedure: REPLACEMENT OF ASCENDING AORTA USING 34MM X 30CM WOVEN DOUBLE VELOUR VASCULAR GRAFT.;  Surgeon: [name], MD;  Location: [facility];  Service: Open Heart Surgery;  Laterality: N/A;   TEE WITHOUT CARDIOVERSION N/A 10/29/2015   Procedure: TRANSESOPHAGEAL ECHOCARDIOGRAM (TEE);  Surgeon: [name], MD;  Location: [facility];  Service: Open Heart  Surgery;  Laterality: N/A;   UVULECTOMY N/A 12/10/2015   Procedure: UVULECTOMY;  Surgeon: [name], MD;  Location[facility];  Service: ENT;  Laterality: N/A;    Family History  Problem Relation Age of Onset   Cancer Father        Throat cancer   Diabetes Sister    Colon cancer Neg Hx     Allergies  Allergen Reactions   Amiodarone Nausea And Vomiting   Quinolones     Patient was warned about not using Cipro and similar antibiotics. Recent studies have raised concern that fluoroquinolone antibiotics could be associated with an increased risk of aortic aneurysm Fluoroquinolones have non-antimicrobial properties that might jeopardise the integrity  of the extracellular matrix of the vascular wall In a  propensity score matched cohort study in [place], there was a 66% increased rate of aortic aneurysm or dissection associated with oral fluoroquinolone use, compared wit    Current Outpatient Medications on File Prior to Visit  Medication Sig Dispense Refill   Accu-Chek Softclix Lancets lancets Use as instructed 100 each 12   aspirin EC 81 MG tablet Take 81 mg by mouth daily.     Blood Glucose Monitoring Suppl (ACCU-CHEK GUIDE ME) w/Device KIT 1 Stick by Does not apply route 3 (three) times daily. Used to check blood sugar 3 times daily 1 kit 0   Continuous Blood Gluc Sensor (FREESTYLE LIBRE 2 SENSOR) MISC 1 Device by Does not apply route every 14 (fourteen) days. 2 each 6   Evolocumab (REPATHA SURECLICK) 749 MG/ML SOAJ Inject 1 pen. into the skin every 14 (fourteen) days. 6 mL 3   glipiZIDE (GLUCOTROL XL) 10 MG 24 hr tablet TAKE 1 TABLET (10 MG TOTAL) BY MOUTH IN THE MORNING AND AT BEDTIME 180 tablet 1   glucose blood (ACCU-CHEK GUIDE) test strip Use as instructed 100 each 1   hydrochlorothiazide (HYDRODIURIL) 25 MG tablet Take 1 tablet (25 mg total) by mouth daily. 90 tablet 3   insulin glargine (LANTUS SOLOSTAR) 100 UNIT/ML Solostar Pen Inject 22 Units into the skin 2 (two) times daily.  40 mL 0   Insulin Pen Needle (B-D ULTRAFINE III SHORT PEN) 31G X 8 MM MISC Use BID 200 each 3   metoprolol tartrate (LOPRESSOR) 25 MG tablet TAKE 1 TABLET BY MOUTH TWICE A DAY 180 tablet 2   ondansetron (ZOFRAN) 4 MG tablet Take 1 tablet (4 mg total) by mouth every 6 (six) hours. 12 tablet 0   rosuvastatin (CRESTOR) 40 MG tablet TAKE 1 TABLET BY MOUTH EVERYDAY AT BEDTIME 90 tablet 1   tamsulosin (FLOMAX) 0.4 MG CAPS capsule Take 1 capsule (0.4 mg total) by mouth daily. 90 capsule 3   No current facility-administered medications on file prior to visit.    BP 132/80   Pulse 72   Temp 98.2 F (36.8 C)   Wt 171 lb (77.6 kg)   SpO2 96%   BMI 24.19 kg/m       Objective:   Physical Exam Vitals and nursing note reviewed.  Constitutional:      Appearance: Normal appearance.  Cardiovascular:     Rate and Rhythm: Normal rate and regular rhythm.     Pulses: Normal pulses.     Heart sounds: Normal heart sounds.  Pulmonary:     [place]: Pulmonary effort is normal.     Breath sounds: Normal breath sounds.  Neurological:     General: No focal deficit present.     Mental Status: He is alert and oriented to person, place, and time.  Psychiatric:        Mood and Affect: Mood normal.        Behavior: Behavior normal.        Thought Content: Thought content normal.        Judgment: Judgment normal.       Assessment & Plan:   1. Type 2 diabetes mellitus with other specified complication, with long-term current use of insulin (HCC)  - POC HgB A1c- 5.7 - improved and at goal  - Will d/c Trulicity since he has not been able to get it from the pharmacy and it is expensive for him  - Also d/c metformin since  he has not been taking it  - Continuous Blood Gluc Sensor (FREESTYLE LIBRE 2 SENSOR) MISC; 1 Device by Does not apply route every 14 (fourteen) days.  Dispense: 2 each; Refill: 6 - Follow up in three months   2. Essential hypertension - At goal  - No change in medications   [name], NP

## 2021-11-29 NOTE — Addendum Note (Signed)
Addended by: Apolinar Junes on: 11/29/2021 02:57 PM   Modules accepted: Orders

## 2021-11-29 NOTE — Patient Instructions (Addendum)
Your A1c was 5.7 - this is great   I am going to discontinue metformin and and Trulicity since you have not been using it   Please schedule a 3 month appointment at the front desk

## 2021-11-29 NOTE — Telephone Encounter (Signed)
Pt called and wanted to know if he needs more insulin for his sugar. Pt stated he did not need anything for cholesterol.

## 2021-11-30 NOTE — Telephone Encounter (Signed)
Called pt to advise of message below.Pt verbalized understanding.

## 2021-12-06 ENCOUNTER — Telehealth: Payer: Self-pay

## 2021-12-06 NOTE — Telephone Encounter (Signed)
Rosuvastatin still not filled.  Embedded Pharmacist said patient is taking Repatha and Rosuvastatin--follow up in1 month- no upcoming PCP or cardiology appointment. statin fill or stain exclusion coding.  Cardiologist manages statin medication.  Last OV with cardio:  Patient presents to Lipid clinic today. 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 Lives with his daughter. Use to walk around the neighborhood when the weather was nice. States he has been taking his rosuvastatin daily. Continue rosuvastatin '40mg'$  daily  LVM to see if patient is still taking statin b/c it's not on his medication list. If patient calls back please find out if he's taking Rosuvastatin.

## 2021-12-06 NOTE — Telephone Encounter (Signed)
Pt called and stated he is still taking his statin, says the last refill they gave him 2 big bottles

## 2021-12-07 DIAGNOSIS — E113313 Type 2 diabetes mellitus with moderate nonproliferative diabetic retinopathy with macular edema, bilateral: Secondary | ICD-10-CM | POA: Diagnosis not present

## 2021-12-07 DIAGNOSIS — H3582 Retinal ischemia: Secondary | ICD-10-CM | POA: Diagnosis not present

## 2021-12-07 DIAGNOSIS — H31093 Other chorioretinal scars, bilateral: Secondary | ICD-10-CM | POA: Diagnosis not present

## 2021-12-07 DIAGNOSIS — H25813 Combined forms of age-related cataract, bilateral: Secondary | ICD-10-CM | POA: Diagnosis not present

## 2021-12-07 NOTE — Telephone Encounter (Signed)
Noted  

## 2021-12-15 DIAGNOSIS — E113311 Type 2 diabetes mellitus with moderate nonproliferative diabetic retinopathy with macular edema, right eye: Secondary | ICD-10-CM | POA: Diagnosis not present

## 2021-12-20 DIAGNOSIS — E113312 Type 2 diabetes mellitus with moderate nonproliferative diabetic retinopathy with macular edema, left eye: Secondary | ICD-10-CM | POA: Diagnosis not present

## 2021-12-27 ENCOUNTER — Other Ambulatory Visit: Payer: Self-pay | Admitting: Adult Health

## 2022-02-20 DIAGNOSIS — E113311 Type 2 diabetes mellitus with moderate nonproliferative diabetic retinopathy with macular edema, right eye: Secondary | ICD-10-CM | POA: Diagnosis not present

## 2022-02-21 ENCOUNTER — Ambulatory Visit (INDEPENDENT_AMBULATORY_CARE_PROVIDER_SITE_OTHER): Payer: Medicare PPO

## 2022-02-21 VITALS — BP 126/60 | HR 70 | Temp 98.0°F | Ht 70.5 in | Wt 176.4 lb

## 2022-02-21 DIAGNOSIS — Z0000 Encounter for general adult medical examination without abnormal findings: Secondary | ICD-10-CM | POA: Diagnosis not present

## 2022-02-21 NOTE — Progress Notes (Signed)
Subjective:   [name] is a 77 y.o. male who presents for Medicare Annual/Subsequent preventive examination.  Review of Systems     Cardiac Risk Factors include: advanced age (>82mn, >>68women);diabetes mellitus;hypertension;male gender     Objective:    Today's Vitals   02/21/22 1441  BP: 126/60  Pulse: 70  Temp: 98 F (36.7 C)  TempSrc: Oral  SpO2: 99%  Weight: 176 lb 6.4 oz (80 kg)  Height: 5' 10.5" (1.791 m)   Body mass index is 24.95 kg/m.     02/21/2022    2:52 PM 10/10/2021    6:02 PM 02/18/2021    2:23 PM 07/13/2020    1:03 PM 03/25/2020    5:41 PM 02/18/2020    3:06 PM 02/18/2019    7:46 AM  Advanced Directives  Does Patient Have a Medical Advance Directive? Yes No Yes No No No Yes  Type of AParamedicof ASan BenitoLiving will  HDelhiLiving will    HLake Alman[place] Country ClubLiving will  Does patient want to make changes to medical advance directive?   No - Patient declined    No - Patient declined  Copy of [place] Chart? No - copy requested  No - copy requested      Would patient like information on creating a medical advance directive?  No - Patient declined  No - Patient declined No - Patient declined No - Patient declined     Current Medications (verified) Outpatient Encounter Medications as of 02/21/2022  Medication Sig   Accu-Chek Softclix Lancets lancets Use as instructed   aspirin EC 81 MG tablet Take 81 mg by mouth daily.   Blood Glucose Monitoring Suppl (ACCU-CHEK GUIDE ME) w/Device KIT 1 Stick by Does not apply route 3 (three) times daily. Used to check blood sugar 3 times daily   Continuous Blood Gluc Sensor (FREESTYLE LIBRE 2 SENSOR) MISC 1 Device by Does not apply route every 14 (fourteen) days.   Dulaglutide (TRULICITY) 01.06MYI/9.4WNSOPN INJECT 0.75 MG SUBCUTANEOUSLY ONE TIME PER WEEK   Evolocumab (REPATHA SURECLICK) 1462MG/ML SOAJ Inject 1 pen. into the skin every 14  (fourteen) days.   glipiZIDE (GLUCOTROL XL) 10 MG 24 hr tablet TAKE 1 TABLET (10 MG TOTAL) BY MOUTH IN THE MORNING AND AT BEDTIME   glucose blood (ACCU-CHEK GUIDE) test strip Use as instructed   hydrochlorothiazide (HYDRODIURIL) 25 MG tablet Take 1 tablet (25 mg total) by mouth daily.   insulin glargine (LANTUS SOLOSTAR) 100 UNIT/ML Solostar Pen Inject 22 Units into the skin 2 (two) times daily.   Insulin Pen Needle (B-D ULTRAFINE III SHORT PEN) 31G X 8 MM MISC Use BID   metoprolol tartrate (LOPRESSOR) 25 MG tablet TAKE 1 TABLET BY MOUTH TWICE A DAY   ondansetron (ZOFRAN) 4 MG tablet Take 1 tablet (4 mg total) by mouth every 6 (six) hours.   tamsulosin (FLOMAX) 0.4 MG CAPS capsule Take 1 capsule (0.4 mg total) by mouth daily.   No facility-administered encounter medications on file as of 02/21/2022.    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   Coronary artery disease    Multivessel status post CABG September 2017   COVID-19    Diabetes mellitus, type  2 [facility])    Erectile dysfunction    Essential hypertension    Hyperlipidemia    Palpitations    Cardiac monitor 03/2019: normal sinus rhythm, no AF, rare NSVT, VT   Past Surgical History:  Procedure Laterality Date   AORTIC VALVE REPLACEMENT N/A 10/29/2015   Procedure: AORTIC VALVE REPLACEMENT (AVR) WITH 23MM MAGNA EASE TISSUE VALVE.;  Surgeon: [name], MD;  Location: [place];  Service: Open Heart Surgery;  Laterality: N/A;   CARDIAC CATHETERIZATION N/A 10/27/2015   Procedure: Left Heart Cath and Coronary Angiography;  Surgeon: [name], MD;  Location: [facility];  Service: Cardiovascular;  Laterality: N/A;   COLONOSCOPY  09/02/2015   Dr.Pyrtle   CORONARY ARTERY BYPASS GRAFT N/A 10/29/2015    Procedure: CORONARY ARTERY BYPASS GRAFTING (CABG) x Four UTILIZING THE LEFT INTERNAL MAMMARY ARTERY AND ENDOSCOPICALLY HARVESTED RIGHT SAPEHENEOUS VEINS.;  Surgeon: [name], MD;  Location: [place];  Service: Open Heart Surgery;  Laterality: N/A;   CYST REMOVAL HAND Right    ESOPHAGOGASTRODUODENOSCOPY N/A 12/02/2015   Procedure: ESOPHAGOGASTRODUODENOSCOPY (EGD);  Surgeon: [name], MD;  Location: [place];  Service: Gastroenterology;  Laterality: N/A;   REPLACEMENT ASCENDING AORTA N/A 10/29/2015   Procedure: REPLACEMENT OF ASCENDING AORTA USING 34MM X 30CM WOVEN DOUBLE VELOUR VASCULAR GRAFT.;  Surgeon: [name], MD;  Location: [place];  Service: Open Heart Surgery;  Laterality: N/A;   TEE WITHOUT CARDIOVERSION N/A 10/29/2015   Procedure: TRANSESOPHAGEAL ECHOCARDIOGRAM (TEE);  Surgeon: [name], MD;  Location: [place];  Service: Open Heart Surgery;  Laterality: N/A;   UVULECTOMY N/A 12/10/2015   Procedure: UVULECTOMY;  Surgeon: [name], MD;  Location: [facility];  Service: ENT;  Laterality: N/A;   Family History  Problem Relation Age of Onset   Cancer Father        Throat cancer   Diabetes Sister    Colon cancer Neg Hx    Social History   Socioeconomic History   Marital status: Widowed    Spouse name: Not on file   Number of children: 5   Years of education: 10    Highest education level: 10th grade  Occupational History   Occupation: custodian    Comment: PT  Tobacco Use   Smoking status: Never   Smokeless tobacco: Never  Vaping Use   Vaping Use: Never used  Substance and Sexual Activity   Alcohol use: Yes    Comment: 1 x month   Drug use: No   Sexual activity: Yes  Other Topics Concern   Not on file  Social History Narrative   Widowed to [name] for 25 years; 2 daughters liver with him now   Works part time as Chiropodist   5 children; 1 son deceased   Never Smoked   Alcohol use- no   Caffeine use: occasional        Lost one child a few months ago    71 his sister, then son and then nephew - all within 2 weeks apart   Son had a stroke at 33    Was planning to get married last month in Feb.    Social Determinants of Health   Financial Resource Strain: [place]  (02/21/2022)   Overall Financial Resource Strain (CARDIA)    Difficulty of Paying Living Expenses: Not hard at all  Food Insecurity: No Food Insecurity (02/21/2022)   Hunger Vital Sign    Worried About Running Out of  Food in the Last Year: Never true    [place] in the Last Year: Never true  Transportation Needs: No Transportation Needs (02/21/2022)   PRAPARE - Hydrologist (Medical): No    Lack of Transportation (Non-Medical): No  Physical Activity: Insufficiently Active (02/21/2022)   Exercise Vital Sign    Days of Exercise per Week: 3 days    Minutes of Exercise per Session: 30 min  Stress: No Stress Concern Present (02/21/2022)   [place]    Feeling of Stress : Not at all  Social Connections: Moderately Integrated (02/21/2022)   Social Connection and Isolation Panel [NHANES]    Frequency of Communication with Friends and Family: More than three times a week    Frequency of Social Gatherings with Friends and Family: More than three times a week    Attends Religious Services: More than 4 times per year    Active Member of [employer] or Organizations: Yes    Attends Archivist Meetings: More than 4 times per year    Marital Status: Widowed    Tobacco Counseling Counseling given: Not Answered   Clinical Intake:  Pre-visit preparation completed: No  Pain : No/denies pain     BMI - recorded: 24.95 Nutritional Status: BMI of 19-24  Normal Nutritional Risks: None Diabetes: Yes CBG done?: Yes CBG resulted in Enter/ Edit results?: Yes (CBG 122) Did pt. bring in CBG monitor from home?: YesNutrition Risk Assessment:  Has the  patient had any N/V/D within the last 2 months?  No  Does the patient have any non-healing wounds?  No  Has the patient had any unintentional weight loss or weight gain?  No   Diabetes:  Is the patient diabetic?  Yes  If diabetic, was a CBG obtained today?  Yes CBG 122 Taken by patient Did the patient bring in their glucometer from home?  Yes  How often do you monitor your CBG's? 2x Daily.   Financial Strains and Diabetes Management:  Are you having any financial strains with the device, your supplies or your medication? No .  Does the patient want to be seen by Chronic Care Management for management of their diabetes?  No  Would the patient like to be referred to a Nutritionist or for Diabetic Management?  No   Diabetic Exams:  Diabetic Eye Exam: Completed No. Overdue for diabetic eye exam. Pt has been advised about the importance in completing this exam. A referral has been placed today. Message sent to referral coordinator for scheduling purposes. Advised pt to expect a call from office referred to regarding appt.  Diabetic Foot Exam: Completed No. Pt has been advised about the importance in completing this exam. Pt is scheduled for diabetic foot exam on Followed by PCP.    How often do you need to have someone help you when you read instructions, pamphlets, or other written materials from your doctor or pharmacy?: 1 - Never  Diabetic?  Yes  Interpreter Needed?: No  Information entered by :: Rolene Arbour LPN   Activities of Daily Living    02/21/2022    2:51 PM  In your present state of health, do you have any difficulty performing the following activities:  Hearing? 0  Vision? 0  Difficulty concentrating or making decisions? 0  Walking or climbing stairs? 0  Dressing or bathing? 0  Doing errands, shopping? 0  Preparing Food and eating ? N  Using the Toilet? N  In the past six months, have you accidently leaked urine? N  Do you have problems with loss of bowel  control? N  Managing your Medications? N  Managing your Finances? N  Housekeeping or managing your Housekeeping? N    Patient Care Team: [name], NP as PCP - General (Family Medicine) [name], [name], MD as PCP - Cardiology (Cardiology) Alda Berthold, DO as Consulting Physician (Neurology) Berle Mull, MD as Consulting Physician (Family Medicine) Sharmon Revere as Physician Assistant (Cardiology)  Indicate any recent Medical Services you may have received from other than [facility] providers in the past year (date may be approximate).     Assessment:   This is a routine wellness examination for [place].  Hearing/Vision screen Hearing Screening - Comments:: Denies hearing difficulties   Vision Screening - Comments:: Wears rx glasses - up to date with routine eye exams with  Dr Satira Sark  Dietary issues and exercise activities discussed: Current Exercise Habits: Home exercise routine, Type of exercise: walking, Time (Minutes): 30, Frequency (Times/Week): 3, Weekly Exercise (Minutes/Week): 90, Intensity: Moderate, Exercise limited by: None identified   Goals Addressed               This Visit's Progress     Patient Stated (pt-stated)        Keep walking and maintain health.        Depression Screen    02/21/2022    2:50 PM 09/23/2021    9:23 AM 09/05/2021    2:24 PM 04/19/2021    1:19 PM 03/15/2021    2:40 PM 02/18/2021    2:12 PM 02/18/2020    3:10 PM  PHQ 2/9 Scores  PHQ - 2 Score 0 0 0 0 6 0 0  PHQ- 9 Score   0  21  0    Fall Risk    02/21/2022    2:52 PM 09/23/2021    9:26 AM 09/05/2021    2:25 PM 02/18/2021    2:17 PM 01/18/2021    2:34 PM  [place] in the past year? 0 0 0 0 0  Number falls in past yr: 0 0 0 0 0  Injury with Fall? 0 0 0 0   Risk for fall due to : No Fall Risks No Fall Risks No Fall Risks    Follow up Falls prevention discussed  Falls evaluation completed      FALL RISK PREVENTION PERTAINING TO THE HOME:  Any stairs in or around  the home? Yes  If so, are there any without handrails? No  Home free of loose throw rugs in walkways, pet beds, electrical cords, etc? Yes  Adequate lighting in your home to reduce risk of falls? Yes   ASSISTIVE DEVICES UTILIZED TO PREVENT FALLS:  Life alert? No  Use of a cane, walker or w/c? No  Grab bars in the bathroom? No  Shower chair or bench in shower? No  Elevated toilet seat or a handicapped toilet? No   TIMED UP AND GO:  Was the test performed? Yes .  Length of time to ambulate 10 feet: 10 sec.   Gait steady and fast without use of assistive device Gait steady and fast without use of assistive device Cognitive Function:        02/21/2022    2:52 PM 02/18/2021    2:19 PM 01/27/2019    1:31 PM  6CIT Screen  What Year? 0 points 0  points 0 points  What month? 0 points 0 points 0 points  What time? 0 points 0 points 0 points  Count back from 20 0 points 0 points 0 points  Months in reverse 4 points 2 points 0 points  Repeat phrase 4 points 2 points 0 points  Total Score 8 points 4 points 0 points    Immunizations Immunization History  Administered Date(s) Administered   Fluad Quad(high Dose 65+) 11/06/2019, 01/19/2021   Influenza Whole 03/15/2009, 04/06/2010   Influenza, High Dose Seasonal PF 12/29/2013, 12/25/2014, 04/24/2017, 01/16/2018   Influenza,inj,Quad PF,6+ Mos 11/12/2012, 11/25/2015, 01/18/2021   PFIZER(Purple Top)SARS-COV-2 Vaccination 07/08/2019   Pneumococcal Conjugate-13 04/24/2014   Pneumococcal Polysaccharide-23 12/13/2007, 11/25/2015   Td 09/13/2008    TDAP status: Due, Education has been provided regarding the importance of this vaccine. Advised may receive this vaccine at local pharmacy or Health Dept. Aware to provide a copy of the vaccination record if obtained from local pharmacy or Health Dept. Verbalized acceptance and understanding.  Flu Vaccine status: Declined, Education has been provided regarding the importance of this vaccine but  patient still declined. Advised may receive this vaccine at local pharmacy or Health Dept. Aware to provide a copy of the vaccination record if obtained from local pharmacy or Health Dept. Verbalized acceptance and understanding.  Pneumococcal vaccine status: Up to date  Covid-19 vaccine status: Completed vaccines  Qualifies for Shingles Vaccine? Yes   Zostavax completed No   Shingrix Completed?: No.    Education has been provided regarding the importance of this vaccine. Patient has been advised to call insurance company to determine out of pocket expense if they have not yet received this vaccine. Advised may also receive vaccine at local pharmacy or Health Dept. Verbalized acceptance and understanding.  Screening Tests Health Maintenance  Topic Date Due   DTaP/Tdap/Td (2 - Tdap) 09/14/2018   OPHTHALMOLOGY EXAM  01/01/2020   COVID-19 Vaccine (2 - 2023-24 season) 03/09/2022 (Originally 10/14/2021)   INFLUENZA VACCINE  05/14/2022 (Originally 09/13/2021)   Zoster Vaccines- Shingrix (1 of 2) 05/23/2022 (Originally 06/30/1997)   Diabetic kidney evaluation - eGFR measurement  04/20/2022   Diabetic kidney evaluation - Urine ACR  04/20/2022   FOOT EXAM  04/20/2022   HEMOGLOBIN A1C  05/31/2022   Medicare Annual Wellness (AWV)  02/22/2023   Pneumonia Vaccine 34+ Years old  Completed   Hepatitis C Screening  Completed   HPV VACCINES  Aged Out   COLONOSCOPY (Pts 45-38yr Insurance coverage will need to be confirmed)  Discontinued    Health Maintenance  Health Maintenance Due  Topic Date Due   DTaP/Tdap/Td (2 - Tdap) 09/14/2018   OPHTHALMOLOGY EXAM  01/01/2020    Colorectal cancer screening: No longer required.   Lung Cancer Screening: (Low Dose CT Chest recommended if Age 669-80years, 30 pack-year currently smoking OR have quit w/in 15years.) does not qualify.     Additional Screening:  Hepatitis C Screening: does qualify; Completed 11/19/15  Vision Screening: Recommended annual  ophthalmology exams for early detection of glaucoma and other disorders of the eye. Is the patient up to date with their annual eye exam?  Yes  Who is the provider or what is the name of the office in which the patient attends annual eye exams? Dr TSatira SarkIf pt is not established with a provider, would they like to be referred to a provider to establish care? No .   Dental Screening: Recommended annual dental exams for proper oral hygiene  Community Resource Referral /  Chronic Care Management:  CRR required this visit?  No   CCM required this visit?  No      Plan:     I have personally reviewed and noted the following in the patient's chart:   Medical and social history Use of alcohol, tobacco or illicit drugs  Current medications and supplements including opioid prescriptions. Patient is not currently taking opioid prescriptions. Functional ability and status Nutritional status Physical activity Advanced directives List of other physicians Hospitalizations, surgeries, and ER visits in previous 12 months Vitals Screenings to include cognitive, depression, and falls Referrals and appointments  In addition, I have reviewed and discussed with patient certain preventive protocols, quality metrics, and best practice recommendations. A written personalized care plan for preventive services as well as general preventive health recommendations were provided to patient.     Criselda Peaches, LPN   02/19/9448   Nurse Notes: None

## 2022-02-21 NOTE — Patient Instructions (Addendum)
[name] , Thank you for taking time to come for your Medicare Wellness Visit. I appreciate your ongoing commitment to your health goals. Please review the following plan we discussed and let me know if I can assist you in the future.   These are the goals we discussed:  Goals       AWV/Adult Days/PACE (pt-stated)      Care Coordination Interventions: Reviewed medications with patient and discussed purpose of all medications Reviewed scheduled/upcoming provider appointments including all pending appointments and next AWV on 02/21/2022 with PCP Care Guide referral for Adult Day/PACE Screening for signs and symptoms of depression related to chronic disease state  Assessed social determinant of health barriers       Exercise 3x per week (30 min per time)      HEMOGLOBIN A1C < 7      Patient Stated (pt-stated)      Keep walking and maintain health.       Patient Stated      I would like to get me a food truck.         This is a list of the screening recommended for you and due dates:  Health Maintenance  Topic Date Due   DTaP/Tdap/Td vaccine (2 - Tdap) 09/14/2018   Eye exam for diabetics  01/01/2020   COVID-19 Vaccine (2 - 2023-24 season) 03/09/2022*   Flu Shot  05/14/2022*   Zoster (Shingles) Vaccine (1 of 2) 05/23/2022*   Yearly kidney function blood test for diabetes  04/20/2022   Yearly kidney health urinalysis for diabetes  04/20/2022   Complete foot exam   04/20/2022   Hemoglobin A1C  05/31/2022   Medicare Annual Wellness Visit  02/22/2023   Pneumonia Vaccine  Completed   Hepatitis C Screening: USPSTF Recommendation to screen - Ages 18-79 yo.  Completed   HPV Vaccine  Aged Out   Colon Cancer Screening  Discontinued  *Topic was postponed. The date shown is not the original due date.    Advanced directives: Please bring a copy of your health care power of attorney and living will to the office to be added to your chart at your convenience.   Conditions/risks  identified: None  Next appointment: Follow up in one year for your annual wellness visit.    Preventive Care 24 Years and Older, Male  Preventive care refers to lifestyle choices and visits with your health care provider that can promote health and wellness. What does preventive care include? A yearly physical exam. This is also called an annual well check. Dental exams once or twice a year. Routine eye exams. Ask your health care provider how often you should have your eyes checked. Personal lifestyle choices, including: Daily care of your teeth and gums. Regular physical activity. Eating a healthy diet. Avoiding tobacco and drug use. Limiting alcohol use. Practicing safe sex. Taking low doses of aspirin every day. Taking vitamin and mineral supplements as recommended by your health care provider. What happens during an annual well check? The services and screenings done by your health care provider during your annual well check will depend on your age, overall health, lifestyle risk factors, and family history of disease. Counseling  Your health care provider may ask you questions about your: Alcohol use. Tobacco use. Drug use. Emotional well-being. Home and relationship well-being. Sexual activity. Eating habits. History of falls. Memory and ability to understand (cognition). Work and work Statistician. Screening  You may have the following tests or measurements: Height, weight,  and BMI. Blood pressure. Lipid and cholesterol levels. These may be checked every 5 years, or more frequently if you are over 14 years old. Skin check. Lung cancer screening. You may have this screening every year starting at age 47 if you have a 30-pack-year history of smoking and currently smoke or have quit within the past 15 years. Fecal occult blood test (FOBT) of the stool. You may have this test every year starting at age 32. Flexible sigmoidoscopy or colonoscopy. You may have a  sigmoidoscopy every 5 years or a colonoscopy every 10 years starting at age 80. Prostate cancer screening. Recommendations will vary depending on your family history and other risks. Hepatitis C blood test. Hepatitis B blood test. Sexually transmitted disease (STD) testing. Diabetes screening. This is done by checking your blood sugar (glucose) after you have not eaten for a while (fasting). You may have this done every 1-3 years. Abdominal aortic aneurysm (AAA) screening. You may need this if you are a current or former smoker. Osteoporosis. You may be screened starting at age 45 if you are at high risk. Talk with your health care provider about your test results, treatment options, and if necessary, the need for more tests. Vaccines  Your health care provider may recommend certain vaccines, such as: Influenza vaccine. This is recommended every year. Tetanus, diphtheria, and acellular pertussis (Tdap, Td) vaccine. You may need a Td booster every 10 years. Zoster vaccine. You may need this after age 23. Pneumococcal 13-valent conjugate (PCV13) vaccine. One dose is recommended after age 88. Pneumococcal polysaccharide (PPSV23) vaccine. One dose is recommended after age 2. Talk to your health care provider about which screenings and vaccines you need and how often you need them. This information is not intended to replace advice given to you by your health care provider. Make sure you discuss any questions you have with your health care provider. Document Released: 02/26/2015 Document Revised: 10/20/2015 Document Reviewed: 12/01/2014 Elsevier Interactive Patient Education  2017 [place] Prevention in the Home Falls can cause injuries. They can happen to people of all ages. There are many things you can do to make your home safe and to help prevent falls. What can I do on the outside of my home? Regularly fix the edges of walkways and driveways and fix any cracks. Remove anything  that might make you trip as you walk through a door, such as a raised step or threshold. Trim any bushes or trees on the path to your home. Use bright outdoor lighting. Clear any walking paths of anything that might make someone trip, such as rocks or tools. Regularly check to see if handrails are loose or broken. Make sure that both sides of any steps have handrails. Any raised decks and porches should have guardrails on the edges. Have any leaves, snow, or ice cleared regularly. Use sand or salt on walking paths during winter. Clean up any spills in your garage right away. This includes oil or grease spills. What can I do in the bathroom? Use night lights. Install grab bars by the toilet and in the tub and shower. Do not use towel bars as grab bars. Use non-skid mats or decals in the tub or shower. If you need to sit down in the shower, use a plastic, non-slip stool. Keep the floor dry. Clean up any water that spills on the floor as soon as it happens. Remove soap buildup in the tub or shower regularly. Attach bath mats securely with  double-sided non-slip rug tape. Do not have throw rugs and other things on the floor that can make you trip. What can I do in the bedroom? Use night lights. Make sure that you have a light by your bed that is easy to reach. Do not use any sheets or blankets that are too big for your bed. They should not hang down onto the floor. Have a firm chair that has side arms. You can use this for support while you get dressed. Do not have throw rugs and other things on the floor that can make you trip. What can I do in the kitchen? Clean up any spills right away. Avoid walking on wet floors. Keep items that you use a lot in easy-to-reach places. If you need to reach something above you, use a strong step stool that has a grab bar. Keep electrical cords out of the way. Do not use floor polish or wax that makes floors slippery. If you must use wax, use non-skid floor  wax. Do not have throw rugs and other things on the floor that can make you trip. What can I do with my stairs? Do not leave any items on the stairs. Make sure that there are handrails on both sides of the stairs and use them. Fix handrails that are broken or loose. Make sure that handrails are as long as the stairways. Check any carpeting to make sure that it is firmly attached to the stairs. Fix any carpet that is loose or worn. Avoid having throw rugs at the top or bottom of the stairs. If you do have throw rugs, attach them to the floor with carpet tape. Make sure that you have a light switch at the top of the stairs and the bottom of the stairs. If you do not have them, ask someone to add them for you. What else can I do to help prevent falls? Wear shoes that: Do not have high heels. Have rubber bottoms. Are comfortable and fit you well. Are closed at the toe. Do not wear sandals. If you use a stepladder: Make sure that it is fully opened. Do not climb a closed stepladder. Make sure that both sides of the stepladder are locked into place. Ask someone to hold it for you, if possible. Clearly mark and make sure that you can see: Any grab bars or handrails. First and last steps. Where the edge of each step is. Use tools that help you move around (mobility aids) if they are needed. These include: Canes. Walkers. Scooters. Crutches. Turn on the lights when you go into a dark area. Replace any light bulbs as soon as they burn out. Set up your furniture so you have a clear path. Avoid moving your furniture around. If any of your floors are uneven, fix them. If there are any pets around you, be aware of where they are. Review your medicines with your doctor. Some medicines can make you feel dizzy. This can increase your chance of falling. Ask your doctor what other things that you can do to help prevent falls. This information is not intended to replace advice given to you by your  health care provider. Make sure you discuss any questions you have with your health care provider. Document Released: 11/26/2008 Document Revised: 07/08/2015 Document Reviewed: 03/06/2014 Elsevier Interactive Patient Education  2017 [employer].

## 2022-02-23 DIAGNOSIS — E113312 Type 2 diabetes mellitus with moderate nonproliferative diabetic retinopathy with macular edema, left eye: Secondary | ICD-10-CM | POA: Diagnosis not present

## 2022-03-01 ENCOUNTER — Ambulatory Visit: Payer: Medicare PPO | Admitting: Adult Health

## 2022-03-01 NOTE — Progress Notes (Deleted)
Subjective:    Patient ID: [name], male    DOB: 11-Oct-1945, 77 y.o.   MRN: [id number]  HPI 77 year old male who  has a past medical history of Aortic stenosis, Blood transfusion without reported diagnosis, Carpal tunnel syndrome (09/24/2014), Coronary artery disease, COVID-19, Diabetes mellitus, type 2 ([place]), Erectile dysfunction, Essential hypertension, Hyperlipidemia, and Palpitations.  He presents to the office today for follow-up regarding diabetes and hypertension  Diabetes mellitus type II-currently managed with glipizide 10 mg twice daily, and Lantus 22 units subcu twice daily.  He has been checking his blood sugars with readings in the 120s to 150s.  He denies hypoglycemia. Lab Results  Component Value Date   HGBA1C 5.7 (A) 11/29/2021   Hypertension -managed with HCTZ 25 mg daily and Lopressor 25 mg daily.  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   COVID-19    Diabetes mellitus, type 2 ([employer])    Erectile dysfunction    Essential hypertension    Hyperlipidemia    Palpitations    Cardiac monitor 03/2019: normal sinus rhythm, no AF, rare NSVT, VT    Social History   Socioeconomic History   Marital status: Widowed    Spouse name: Not on file   Number of children: 5   Years of education: 10    Highest education level: 10th grade  Occupational History   Occupation: custodian    Comment: PT  Tobacco Use   Smoking status: Never   Smokeless tobacco: Never  Vaping Use   Vaping Use: Never used  Substance and Sexual Activity   Alcohol use:  Yes    Comment: 1 x month   Drug use: No   Sexual activity: Yes  Other Topics Concern   Not on file  Social History Narrative   Widowed to [name] for 18 years; 2 daughters liver with him now   Works part time as Chiropodist   5 children; 1 son deceased   Never Smoked   Alcohol use- no   Caffeine use: occasional       Lost one child a few months ago    29 his sister, then son and then nephew - all within 2 weeks apart   Son had a stroke at 70    Was planning to get married last month in Feb.    Social Determinants of Health   Financial Resource Strain: [place]  (02/21/2022)   Overall Financial Resource Strain (CARDIA)    Difficulty of Paying Living Expenses: Not hard at all  Food Insecurity: No [place] (02/21/2022)   Hunger Vital Sign    Worried About Running Out of Food in the Last Year: Never true    [place] in the Last Year: Never true  Transportation Needs: No Transportation Needs (02/21/2022)   PRAPARE - Hydrologist (Medical): No    Lack of Transportation (Non-Medical): No  Physical Activity: Insufficiently Active (02/21/2022)   Exercise Vital Sign    Days of Exercise  per Week: 3 days    Minutes of Exercise per Session: 30 min  Stress: No Stress Concern Present (02/21/2022)   [place]    Feeling of Stress : Not at all  Social Connections: Moderately Integrated (02/21/2022)   Social Connection and Isolation Panel [NHANES]    Frequency of Communication with Friends and Family: More than three times a week    Frequency of Social Gatherings with Friends and Family: More than three times a week    Attends Religious Services: More than 4 times per year    Active Member of [employer] or Organizations: Yes    Attends Archivist Meetings: More than 4 times per year    Marital Status: Widowed  Intimate Partner Violence: Not At Risk (02/21/2022)    Humiliation, Afraid, Rape, and Kick questionnaire    Fear of Current or Ex-Partner: No    Emotionally Abused: No    Physically Abused: No    Sexually Abused: No    Past Surgical History:  Procedure Laterality Date   AORTIC VALVE REPLACEMENT N/A 10/29/2015   Procedure: AORTIC VALVE REPLACEMENT (AVR) WITH 23MM MAGNA EASE TISSUE VALVE.;  Surgeon: [name], MD;  Location: [facility];  Service: Open Heart Surgery;  Laterality: N/A;   CARDIAC CATHETERIZATION N/A 10/27/2015   Procedure: Left Heart Cath and Coronary Angiography;  Surgeon: [name], MD;  Location: [facility];  Service: Cardiovascular;  Laterality: N/A;   COLONOSCOPY  09/02/2015   Dr.Pyrtle   CORONARY ARTERY BYPASS GRAFT N/A 10/29/2015   Procedure: CORONARY ARTERY BYPASS GRAFTING (CABG) x Four UTILIZING THE LEFT INTERNAL MAMMARY ARTERY AND ENDOSCOPICALLY HARVESTED RIGHT SAPEHENEOUS VEINS.;  Surgeon: [name], MD;  Location: [facility];  Service: Open Heart Surgery;  Laterality: N/A;   CYST REMOVAL HAND Right    ESOPHAGOGASTRODUODENOSCOPY N/A 12/02/2015   Procedure: ESOPHAGOGASTRODUODENOSCOPY (EGD);  Surgeon: [name], MD;  Location: [facility];  Service: Gastroenterology;  Laterality: N/A;   REPLACEMENT ASCENDING AORTA N/A 10/29/2015   Procedure: REPLACEMENT OF ASCENDING AORTA USING 34MM X 30CM WOVEN DOUBLE VELOUR VASCULAR GRAFT.;  Surgeon: [name], MD;  Location: [facility];  Service: Open Heart Surgery;  Laterality: N/A;   TEE WITHOUT CARDIOVERSION N/A 10/29/2015   Procedure: TRANSESOPHAGEAL ECHOCARDIOGRAM (TEE);  Surgeon: [name], MD;  Location: [facility];  Service: Open Heart Surgery;  Laterality: N/A;   UVULECTOMY N/A 12/10/2015   Procedure: UVULECTOMY;  Surgeon: [name], MD;  [facility];  Service: ENT;  Laterality: N/A;    Family History  Problem Relation Age of Onset   Cancer Father        Throat cancer   Diabetes Sister    Colon cancer Neg Hx     Allergies   Allergen Reactions   Amiodarone Nausea And Vomiting   Quinolones     Patient was warned about not using Cipro and similar antibiotics. Recent studies have raised concern that fluoroquinolone antibiotics could be associated with an increased risk of aortic aneurysm Fluoroquinolones have non-antimicrobial properties that might jeopardise the integrity of the extracellular matrix of the vascular wall In a  propensity score matched cohort study in [place], there was a 66% increased rate of aortic aneurysm or dissection associated with oral fluoroquinolone use, compared wit    Current Outpatient Medications on File Prior to Visit  Medication Sig Dispense Refill   Accu-Chek Softclix Lancets lancets Use as instructed 100 each 12  aspirin EC 81 MG tablet Take 81 mg by mouth daily.     Blood Glucose Monitoring Suppl (ACCU-CHEK GUIDE ME) w/Device KIT 1 Stick by Does not apply route 3 (three) times daily. Used to check blood sugar 3 times daily 1 kit 0   Continuous Blood Gluc Sensor (FREESTYLE LIBRE 2 SENSOR) MISC 1 Device by Does not apply route every 14 (fourteen) days. 2 each 6   Dulaglutide (TRULICITY) [id number] 0000000 SOPN INJECT 0.75 MG SUBCUTANEOUSLY ONE TIME PER WEEK 2 mL 2   Evolocumab (REPATHA SURECLICK) [id number] MG/ML SOAJ Inject 1 pen. into the skin every 14 (fourteen) days. 6 mL 3   glipiZIDE (GLUCOTROL XL) 10 MG 24 hr tablet TAKE 1 TABLET (10 MG TOTAL) BY MOUTH IN THE MORNING AND AT BEDTIME 180 tablet 1   glucose blood (ACCU-CHEK GUIDE) test strip Use as instructed 100 each 1   hydrochlorothiazide (HYDRODIURIL) 25 MG tablet Take 1 tablet (25 mg total) by mouth daily. 90 tablet 3   insulin glargine (LANTUS SOLOSTAR) 100 UNIT/ML Solostar Pen Inject 22 Units into the skin 2 (two) times daily. 40 mL 0   Insulin Pen Needle (B-D ULTRAFINE III SHORT PEN) 31G X 8 MM MISC Use BID 200 each 3   metoprolol tartrate (LOPRESSOR) 25 MG tablet TAKE 1 TABLET BY MOUTH TWICE A DAY 180 tablet 2   ondansetron  (ZOFRAN) 4 MG tablet Take 1 tablet (4 mg total) by mouth every 6 (six) hours. 12 tablet 0   tamsulosin (FLOMAX) 0.4 MG CAPS capsule Take 1 capsule (0.4 mg total) by mouth daily. 90 capsule 3   No current facility-administered medications on file prior to visit.    There were no vitals taken for this visit.      Objective:   Physical Exam Vitals and nursing note reviewed.  Constitutional:      Appearance: Normal appearance.  Cardiovascular:     Rate and Rhythm: Normal rate and regular rhythm.     Pulses: Normal pulses.     Heart sounds: Normal heart sounds.  Pulmonary:     Effort: Pulmonary effort is normal.     Breath sounds: Normal breath sounds.  Skin:    General: Skin is warm and dry.  Neurological:     General: No focal deficit present.     Mental Status: He is alert and oriented to person, place, and time. Mental status is at baseline.  Psychiatric:        Mood and Affect: Mood normal.        Behavior: Behavior normal.        Thought Content: Thought content normal.        Judgment: Judgment normal.       Assessment & Plan:

## 2022-03-22 ENCOUNTER — Ambulatory Visit (INDEPENDENT_AMBULATORY_CARE_PROVIDER_SITE_OTHER): Payer: Medicare PPO | Admitting: Adult Health

## 2022-03-22 ENCOUNTER — Encounter: Payer: Self-pay | Admitting: Adult Health

## 2022-03-22 VITALS — BP 142/80 | HR 94 | Temp 98.4°F | Ht 70.5 in | Wt 174.0 lb

## 2022-03-22 DIAGNOSIS — Z794 Long term (current) use of insulin: Secondary | ICD-10-CM

## 2022-03-22 DIAGNOSIS — E1169 Type 2 diabetes mellitus with other specified complication: Secondary | ICD-10-CM

## 2022-03-22 DIAGNOSIS — I10 Essential (primary) hypertension: Secondary | ICD-10-CM

## 2022-03-22 LAB — POCT GLYCOSYLATED HEMOGLOBIN (HGB A1C): Hemoglobin A1C: 6.6 % — AB (ref 4.0–5.6)

## 2022-03-22 MED ORDER — GLIPIZIDE ER 10 MG PO TB24: 10.0000 mg | ORAL_TABLET | Freq: Two times a day (BID) | ORAL | 1 refills | Status: DC

## 2022-03-22 MED ORDER — HYDROCHLOROTHIAZIDE 25 MG PO TABS: 25.0000 mg | ORAL_TABLET | Freq: Every day | ORAL | 3 refills | Status: DC

## 2022-03-22 MED ORDER — BD PEN NEEDLE SHORT U/F 31G X 8 MM MISC: 3 refills | Status: DC

## 2022-03-22 MED ORDER — LANTUS SOLOSTAR 100 UNIT/ML ~~LOC~~ SOPN: 22.0000 [IU] | PEN_INJECTOR | Freq: Two times a day (BID) | SUBCUTANEOUS | 1 refills | Status: DC

## 2022-03-22 MED ORDER — FREESTYLE LIBRE 3 SENSOR MISC: 1.0000 | 11 refills | Status: DC

## 2022-03-22 NOTE — Progress Notes (Signed)
Subjective:    Patient ID: [name], male    DOB: February 05, 1946, 77 y.o.   MRN: [id number]  HPI 77 year old male who  has a past medical history of Aortic stenosis, Blood transfusion without reported diagnosis, Carpal tunnel syndrome (09/24/2014), Coronary artery disease, COVID-19, Diabetes mellitus, type 2 ([place]), Erectile dysfunction, Essential hypertension, Hyperlipidemia, and Palpitations.  He presents to the office today for three month follow up regarding DM and HTN   Diabetes mellitus type II-currently managed with glipizide 10 mg twice daily, and Lantus 22 units subq twice daily.    He denies hypoglycemia. He reports that he has been without glipizide 10 mg  for the last three months and his blood sugars at home and it has been well controlled.  Lab Results  Component Value Date   HGBA1C 5.7 (A) 11/29/2021   Hypertension -managed with HCTZ 25 mg daily and Lopressor 25 mg daily.  He denies dizziness, lightheadedness, chest pain, or shortness of breath. 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   Blood transfusion without reported diagnosis    Carpal tunnel syndrome 09/24/2014   Bilateral   Coronary artery disease    Multivessel status post CABG September 2017   COVID-19    Diabetes mellitus, type 2 ([place])    Erectile dysfunction    Essential hypertension    Hyperlipidemia    Palpitations    Cardiac monitor 03/2019: normal sinus rhythm, no AF, rare NSVT, VT    Social History   Socioeconomic History   Marital status: Widowed    Spouse name: Not on file   Number of children: 5   Years of education: 10    Highest education level: 10th grade  Occupational History   Occupation: custodian    Comment: PT   Tobacco Use   Smoking status: Never   Smokeless tobacco: Never  Vaping Use   Vaping Use: Never used  Substance and Sexual Activity   Alcohol use: Yes    Comment: 1 x month   Drug use: No   Sexual activity: Yes  Other Topics Concern   Not on file  Social History Narrative   Widowed to [name] for 104 years; 2 daughters liver with him now   Works part time as Chiropodist   5 children; 1 son deceased   Never Smoked   Alcohol use- no   Caffeine use: occasional       Lost one child a few months ago    73 his sister, then son and then nephew - all within 2 weeks apart   Son had a stroke at 71    Was planning to get married last month in Feb.    Social Determinants of Health   Financial Resource Strain: [place]  (02/21/2022)   Overall Financial Resource Strain (CARDIA)    Difficulty of Paying Living Expenses: Not hard at all  Food Insecurity: No [place] (02/21/2022)   Hunger Vital Sign    Worried About Running Out of Food in the Last Year: Never true    [place] in the Last Year: Never true  Transportation Needs: No Transportation Needs (02/21/2022)   PRAPARE -  Hydrologist (Medical): No    Lack of Transportation (Non-Medical): No  Physical Activity: Insufficiently Active (02/21/2022)   Exercise Vital Sign    Days of Exercise per Week: 3 days    Minutes of Exercise per Session: 30 min  Stress: No Stress Concern Present (02/21/2022)   [place]    Feeling of Stress : Not at all  Social Connections: Moderately Integrated (02/21/2022)   Social Connection and Isolation Panel [NHANES]    Frequency of Communication with Friends and Family: More than three times a week    Frequency of Social Gatherings with Friends and Family: More than three times a week    Attends Religious Services: More than 4 times per year    Active Member of [employer] or Organizations: Yes     Attends Archivist Meetings: More than 4 times per year    Marital Status: Widowed  Intimate Partner Violence: Not At Risk (02/21/2022)   Humiliation, Afraid, Rape, and Kick questionnaire    Fear of Current or Ex-Partner: No    Emotionally Abused: No    Physically Abused: No    Sexually Abused: No    Past Surgical History:  Procedure Laterality Date   AORTIC VALVE REPLACEMENT N/A 10/29/2015   Procedure: AORTIC VALVE REPLACEMENT (AVR) WITH 23MM MAGNA EASE TISSUE VALVE.;  Surgeon: [name], MD;  Location: [facility];  Service: Open Heart Surgery;  Laterality: N/A;   CARDIAC CATHETERIZATION N/A 10/27/2015   Procedure: Left Heart Cath and Coronary Angiography;  Surgeon: [name], MD;  Location: [facility];  Service: Cardiovascular;  Laterality: N/A;   COLONOSCOPY  09/02/2015   Dr.Pyrtle   CORONARY ARTERY BYPASS GRAFT N/A 10/29/2015   Procedure: CORONARY ARTERY BYPASS GRAFTING (CABG) x Four UTILIZING THE LEFT INTERNAL MAMMARY ARTERY AND ENDOSCOPICALLY HARVESTED RIGHT SAPEHENEOUS VEINS.;  Surgeon: [name], MD;  Location: [facility];  Service: Open Heart Surgery;  Laterality: N/A;   CYST REMOVAL HAND Right    ESOPHAGOGASTRODUODENOSCOPY N/A 12/02/2015   Procedure: ESOPHAGOGASTRODUODENOSCOPY (EGD);  Surgeon: [name], MD;  Location: [facility];  Service: Gastroenterology;  Laterality: N/A;   REPLACEMENT ASCENDING AORTA N/A 10/29/2015   Procedure: REPLACEMENT OF ASCENDING AORTA USING 34MM X 30CM WOVEN DOUBLE VELOUR VASCULAR GRAFT.;  Surgeon: [name], MD;  Location: [facility];  Service: Open Heart Surgery;  Laterality: N/A;   TEE WITHOUT CARDIOVERSION N/A 10/29/2015   Procedure: TRANSESOPHAGEAL ECHOCARDIOGRAM (TEE);  Surgeon: [name], MD;  Location: [facility];  Service: Open Heart Surgery;  Laterality: N/A;   UVULECTOMY N/A 12/10/2015   Procedure: UVULECTOMY;  Surgeon: [name], MD;  Location[facility];  Service: ENT;  Laterality: N/A;     Family History  Problem Relation Age of Onset   Cancer Father        Throat cancer   Diabetes Sister    Colon cancer Neg Hx     Allergies  Allergen Reactions   Amiodarone Nausea And Vomiting   Quinolones     Patient was warned about not using Cipro and similar antibiotics. Recent studies have raised concern that fluoroquinolone antibiotics could be associated with an increased risk of aortic aneurysm Fluoroquinolones have non-antimicrobial properties that might jeopardise the integrity of the extracellular matrix of the vascular wall In a  propensity score matched cohort study in [place], there was a 66% increased rate of aortic aneurysm or dissection associated with  oral fluoroquinolone use, compared wit    Current Outpatient Medications on File Prior to Visit  Medication Sig Dispense Refill   Accu-Chek Softclix Lancets lancets Use as instructed 100 each 12   aspirin EC 81 MG tablet Take 81 mg by mouth daily.     Blood Glucose Monitoring Suppl (ACCU-CHEK GUIDE ME) w/Device KIT 1 Stick by Does not apply route 3 (three) times daily. Used to check blood sugar 3 times daily 1 kit 0   Continuous Blood Gluc Sensor (FREESTYLE LIBRE 2 SENSOR) MISC 1 Device by Does not apply route every 14 (fourteen) days. 2 each 6   Dulaglutide (TRULICITY) 6.27 OJ/5.0KX SOPN INJECT 0.75 MG SUBCUTANEOUSLY ONE TIME PER WEEK 2 mL 2   Evolocumab (REPATHA SURECLICK) 381 MG/ML SOAJ Inject 1 pen. into the skin every 14 (fourteen) days. 6 mL 3   glipiZIDE (GLUCOTROL XL) 10 MG 24 hr tablet TAKE 1 TABLET (10 MG TOTAL) BY MOUTH IN THE MORNING AND AT BEDTIME 180 tablet 1   glucose blood (ACCU-CHEK GUIDE) test strip Use as instructed 100 each 1   hydrochlorothiazide (HYDRODIURIL) 25 MG tablet Take 1 tablet (25 mg total) by mouth daily. 90 tablet 3   insulin glargine (LANTUS SOLOSTAR) 100 UNIT/ML Solostar Pen Inject 22 Units into the skin 2 (two) times daily. 40 mL 0   Insulin Pen Needle (B-D ULTRAFINE III SHORT PEN)  31G X 8 MM MISC Use BID 200 each 3   metoprolol tartrate (LOPRESSOR) 25 MG tablet TAKE 1 TABLET BY MOUTH TWICE A DAY 180 tablet 2   ondansetron (ZOFRAN) 4 MG tablet Take 1 tablet (4 mg total) by mouth every 6 (six) hours. 12 tablet 0   tamsulosin (FLOMAX) 0.4 MG CAPS capsule Take 1 capsule (0.4 mg total) by mouth daily. 90 capsule 3   No current facility-administered medications on file prior to visit.    There were no vitals taken for this visit.      Objective:   Physical Exam Vitals and nursing note reviewed.  Constitutional:      Appearance: Normal appearance.  Cardiovascular:     Rate and Rhythm: Normal rate and regular rhythm.     Pulses: Normal pulses.     Heart sounds: Normal heart sounds.  Pulmonary:     Effort: Pulmonary effort is normal.     Breath sounds: Normal breath sounds.  Skin:    General: Skin is warm and dry.  Neurological:     General: No focal deficit present.     Mental Status: He is alert and oriented to person, place, and time.  Psychiatric:        Mood and Affect: Mood normal.        Behavior: Behavior normal.        Thought Content: Thought content normal.        Judgment: Judgment normal.       Assessment & Plan:  1. Type 2 diabetes mellitus with other specified complication, with long-term current use of insulin (HCC)  - POC HgB A1c- 6.6  - glipiZIDE (GLUCOTROL XL) 10 MG 24 hr tablet; Take 1 tablet (10 mg total) by mouth in the morning and at bedtime.  Dispense: 180 tablet; Refill: 1 - insulin glargine (LANTUS SOLOSTAR) 100 UNIT/ML Solostar Pen; Inject 22 Units into the skin 2 (two) times daily.  Dispense: 40 mL; Refill: 1 - Insulin Pen Needle (B-D ULTRAFINE III SHORT PEN) 31G X 8 MM MISC; Use BID  Dispense: 200 each; Refill: 3 -  Follow up in 3 months CPE  2. Essential hypertension - Better controlled at home  - hydrochlorothiazide (HYDRODIURIL) 25 MG tablet; Take 1 tablet (25 mg total) by mouth daily.  Dispense: 90 tablet; Refill:  3   [name], NP

## 2022-03-22 NOTE — Patient Instructions (Addendum)
Your A1c increased to 6.6. - this was because you were out of glipizide  If blood sugars drop into 70 or below, start cutting back on insulin by 2 units twice a day    Follow up in 3 months for physical exam

## 2022-04-14 ENCOUNTER — Other Ambulatory Visit: Payer: Self-pay | Admitting: Cardiovascular Disease

## 2022-04-17 DIAGNOSIS — E113311 Type 2 diabetes mellitus with moderate nonproliferative diabetic retinopathy with macular edema, right eye: Secondary | ICD-10-CM | POA: Diagnosis not present

## 2022-04-24 ENCOUNTER — Other Ambulatory Visit: Payer: Self-pay | Admitting: Surgery

## 2022-04-24 DIAGNOSIS — I7121 Aneurysm of the ascending aorta, without rupture: Secondary | ICD-10-CM

## 2022-04-28 ENCOUNTER — Other Ambulatory Visit: Payer: Self-pay | Admitting: Cardiovascular Disease

## 2022-05-01 DIAGNOSIS — E113312 Type 2 diabetes mellitus with moderate nonproliferative diabetic retinopathy with macular edema, left eye: Secondary | ICD-10-CM | POA: Diagnosis not present

## 2022-05-24 ENCOUNTER — Other Ambulatory Visit: Payer: Self-pay

## 2022-05-24 DIAGNOSIS — I10 Essential (primary) hypertension: Secondary | ICD-10-CM

## 2022-05-24 DIAGNOSIS — E08311 Diabetes mellitus due to underlying condition with unspecified diabetic retinopathy with macular edema: Secondary | ICD-10-CM

## 2022-06-06 DIAGNOSIS — H3582 Retinal ischemia: Secondary | ICD-10-CM | POA: Diagnosis not present

## 2022-06-06 DIAGNOSIS — E113313 Type 2 diabetes mellitus with moderate nonproliferative diabetic retinopathy with macular edema, bilateral: Secondary | ICD-10-CM | POA: Diagnosis not present

## 2022-06-06 DIAGNOSIS — H31093 Other chorioretinal scars, bilateral: Secondary | ICD-10-CM | POA: Diagnosis not present

## 2022-06-06 DIAGNOSIS — H25813 Combined forms of age-related cataract, bilateral: Secondary | ICD-10-CM | POA: Diagnosis not present

## 2022-06-07 ENCOUNTER — Ambulatory Visit: Payer: Medicare PPO | Admitting: Surgery

## 2022-06-07 ENCOUNTER — Ambulatory Visit
Admission: RE | Admit: 2022-06-07 | Discharge: 2022-06-07 | Disposition: A | Discharge status: Home / Self Care | Payer: Medicare PPO | Source: Ambulatory Visit | Attending: Surgery | Admitting: Surgery

## 2022-06-07 ENCOUNTER — Encounter: Payer: Self-pay | Admitting: Surgery

## 2022-06-07 VITALS — BP 139/52 | HR 60 | Resp 20 | Ht 70.5 in | Wt 175.0 lb

## 2022-06-07 DIAGNOSIS — I712 Thoracic aortic aneurysm, without rupture, unspecified: Secondary | ICD-10-CM | POA: Diagnosis not present

## 2022-06-07 DIAGNOSIS — J929 Pleural plaque without asbestos: Secondary | ICD-10-CM | POA: Diagnosis not present

## 2022-06-07 DIAGNOSIS — I2510 Atherosclerotic heart disease of native coronary artery without angina pectoris: Secondary | ICD-10-CM | POA: Diagnosis not present

## 2022-06-07 DIAGNOSIS — I7121 Aneurysm of the ascending aorta, without rupture: Secondary | ICD-10-CM

## 2022-06-07 DIAGNOSIS — I700 Atherosclerosis of aorta: Secondary | ICD-10-CM | POA: Diagnosis not present

## 2022-06-07 MED ORDER — IOPAMIDOL (ISOVUE-370) INJECTION 76%
75.0000 mL | Freq: Once | INTRAVENOUS | Status: AC | PRN
Start: 2022-06-07 — End: 2022-06-07
  Administered 2022-06-07: 75 mL via INTRAVENOUS

## 2022-06-07 NOTE — Progress Notes (Signed)
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 Tyrone Sage in September 2017.  He has a 23 mm Edwards pericardial valve.   He has some enlargement of the proximal aortic arch distal to the ascending graft which Dr. Tyrone Sage had been following.  I saw him on 05/18/2021 after Dr. Dennie Maizes retirement.  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  Since then he has continued to feel well.  He has had no significant change to his medical history.  He denies any chest or back pain.  Current Outpatient Medications  Medication Sig Dispense Refill   Accu-Chek Softclix Lancets lancets Use as instructed 100 each 12   aspirin EC 81 MG tablet Take 81 mg by mouth daily.     Continuous Blood Gluc Sensor (FREESTYLE LIBRE 3 SENSOR) MISC 1 Device by Does not apply route every 14 (fourteen) days. 2 each 11   Evolocumab (REPATHA SURECLICK) 140 MG/ML SOAJ Inject 1 pen. into the skin every 14 (fourteen) days. 6 mL 3   glipiZIDE (GLUCOTROL XL) 10 MG 24 hr tablet Take 1 tablet (10 mg total) by mouth in the morning and at bedtime. 180 tablet 1   hydrochlorothiazide (HYDRODIURIL) 25 MG tablet Take 1 tablet (25 mg total) by mouth daily. 90 tablet 3   insulin glargine (LANTUS SOLOSTAR) 100 UNIT/ML Solostar Pen Inject 22 Units into the skin 2 (two) times daily. 40 mL 1   Insulin Pen Needle (B-D ULTRAFINE III SHORT PEN) 31G X 8 MM MISC Use BID 200 each 3   metoprolol tartrate (LOPRESSOR) 25 MG tablet TAKE 1 TABLET BY MOUTH TWICE A DAY 30 tablet 0   tamsulosin (FLOMAX) 0.4 MG CAPS capsule Take 1 capsule (0.4 mg total) by mouth daily. 90 capsule 3   No current facility-administered medications for this visit.     Physical Exam: BP (!) 139/52   Pulse 60    Resp 20   Ht 5' 10.5" (1.791 m)   Wt 175 lb (79.4 kg)   SpO2 96% Comment: RA  BMI 24.75 kg/m  He looks well. Cardiac exam shows regular rate and rhythm with normal heart sounds.  There is no murmur. Lungs are clear.  Diagnostic Tests:  Narrative & Impression  CLINICAL DATA:  Aneurysm follow-up   EXAM: CT ANGIOGRAPHY CHEST WITH CONTRAST   TECHNIQUE: Multidetector CT imaging of the chest was performed using the standard protocol during bolus administration of intravenous contrast. Multiplanar CT image reconstructions and MIPs were obtained to evaluate the vascular anatomy.   RADIATION DOSE REDUCTION: This exam was performed according to the departmental dose-optimization program which includes automated exposure control, adjustment of the mA and/or kV according to patient size and/or use of iterative reconstruction technique.   CONTRAST:  75mL ISOVUE-370 IOPAMIDOL (ISOVUE-370) INJECTION 76%   COMPARISON:  Chest CT dated May 18, 2021   FINDINGS: Cardiovascular: Normal heart size. No pericardial effusion. Prior aortic valve replacement and graft repair of the ascending thoracic aorta. Severe coronary artery calcifications status post CABG. Mild dilation of the native proximal aortic arch, measuring up to 4.4 cm. No evidence of pseudoaneurysm. Unchanged when compared with prior exam. Common origin of the brachiocephalic and left common carotid artery with unchanged dilation measuring up to 2.6 cm.  Moderate atherosclerotic disease of the thoracic aorta. No suspicious filling defects of the central pulmonary arteries.   Mediastinum/Nodes: Esophagus and thyroid are unremarkable. No enlarged lymph nodes seen in the chest.   Lungs/Pleura: Central airways are patent. No consolidation, pleural effusion or pneumothorax. Bilateral calcified pleural plaques.   Upper Abdomen: Diverticulum of the posterior stomach. No acute abnormality.   Musculoskeletal: Prior median sternotomy  with intact sternal wires. No chest wall abnormality. No acute or significant osseous findings.   Review of the MIP images confirms the above findings.   IMPRESSION: 1. Prior aortic valve replacement and graft repair of the ascending thoracic aorta. No evidence of pseudoaneurysm. 2. Mild dilation of the native proximal aortic arch, measuring up to 4.4 cm. Unchanged when compared with prior exam. 3. 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 4. Bilateral calcified pleural plaques, finding can be seen in the setting of asbestos exposure. 5. Aortic Atherosclerosis (ICD10-I70.0).     Electronically Signed   By: Allegra Lai M.D.   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  There is bovine origin of the innominate and left common carotid arteries with a trunk diameter of 2.6 cm which is unchanged.  His aorta is well below the surgical threshold of 5.5 cm.  I reviewed the CT images with him and answered his questions.  I stressed the importance of continued good blood pressure control and preventing further enlargement and acute aortic dissection.  I advised him against doing any heavy lifting or strenuous activity that may require a Valsalva maneuver and could suddenly raise his blood pressure to high levels.  Plan:  I will see him back in 1 year with a CTA of the chest.  I spent 15 minutes performing this established patient evaluation and > 50% of this time was spent face to face counseling and coordinating the care of this patient's aortic aneurysm.    Alleen Borne, MD [facility] [phone]

## 2022-06-12 DIAGNOSIS — E113311 Type 2 diabetes mellitus with moderate nonproliferative diabetic retinopathy with macular edema, right eye: Secondary | ICD-10-CM | POA: Diagnosis not present

## 2022-06-20 ENCOUNTER — Encounter: Payer: Self-pay | Admitting: Pharmacist

## 2022-06-20 DIAGNOSIS — Z9229 Personal history of other drug therapy: Secondary | ICD-10-CM

## 2022-06-20 NOTE — Progress Notes (Signed)
[facility])                                            [facility] Quality Pharmacy Team                                        Statin Quality Measure Assessment    06/20/2022  [name] [birth year] [id number]  Provider [place],:  I am a [facility] clinical pharmacist that reviews patients for statin quality initiatives.     Per review of chart and payor information, patient has a diagnosis of diabetes and cardiovascular disease but is not currently filling a statin prescription.  This places patient into the SUPD (Statin Use In Patients with Diabetes) and SPC (Statin Use in Patients with Cardiovascular Disease) measures for CMS.    Patient has both Repatha and Rosuvastatin on his medication list.  Repatha has  been filled, Rosuvastatin has not.   Patient has an upcoming appointment 06/21/2022.  If deemed therapeutically, statin therapy could be assessed at the visit. If patient has experienced statin intolerance, a statin exclusion code could be associated with the visit, which would remove the patient from the measure.     Component Value Date/Time   CHOL 146 08/01/2021 1458   TRIG 75 08/01/2021 1458   HDL 105 08/01/2021 1458   CHOLHDL 1.4 08/01/2021 1458   CHOLHDL 2.4 11/06/2019 1440   VLDL 10.6 04/24/2017 1355   LDLCALC 27 08/01/2021 1458   LDLCALC 110 (H) 11/06/2019 1440   LDLDIRECT 112.6 05/14/2012 1031    Please consider ONE of the following recommendations:  Initiate high intensity statin Atorvastatin 40mg  once daily, #90, 3 refills   Rosuvastatin 20mg  once daily, #90, 3 refills    Initiate moderate intensity  statin with reduced frequency if prior  statin intolerance 1x weekly, #13, 3 refills   2x weekly, #26, 3 refills   3x weekly, #39, 3 refills    Code for past statin intolerance  (required annually)   Provider Requirements:  Must associate code during an office visit or telehealth encounter   Drug Induced  Myopathy G72.0   Myositis, unspecified M60.9   Myopathy, unspecified G72.9   Rhabdomyolysis  M62.82                      Plan:  Route note to patent's Provider prior to the upcoming appointment.  Beecher Mcardle, PharmD, BCACP [facility] Clinical Pharmacist [phone]

## 2022-06-21 ENCOUNTER — Other Ambulatory Visit: Payer: Self-pay | Admitting: Adult Health

## 2022-06-21 ENCOUNTER — Encounter: Payer: Self-pay | Admitting: Adult Health

## 2022-06-21 ENCOUNTER — Ambulatory Visit (INDEPENDENT_AMBULATORY_CARE_PROVIDER_SITE_OTHER): Payer: Medicare PPO | Admitting: Adult Health

## 2022-06-21 VITALS — BP 134/78 | HR 70 | Temp 98.3°F | Ht 70.15 in | Wt 174.0 lb

## 2022-06-21 DIAGNOSIS — K4090 Unilateral inguinal hernia, without obstruction or gangrene, not specified as recurrent: Secondary | ICD-10-CM | POA: Diagnosis not present

## 2022-06-21 DIAGNOSIS — E1169 Type 2 diabetes mellitus with other specified complication: Secondary | ICD-10-CM

## 2022-06-21 DIAGNOSIS — I25810 Atherosclerosis of coronary artery bypass graft(s) without angina pectoris: Secondary | ICD-10-CM | POA: Diagnosis not present

## 2022-06-21 DIAGNOSIS — E782 Mixed hyperlipidemia: Secondary | ICD-10-CM | POA: Diagnosis not present

## 2022-06-21 DIAGNOSIS — R351 Nocturia: Secondary | ICD-10-CM

## 2022-06-21 DIAGNOSIS — Z0000 Encounter for general adult medical examination without abnormal findings: Secondary | ICD-10-CM | POA: Diagnosis not present

## 2022-06-21 DIAGNOSIS — N401 Enlarged prostate with lower urinary tract symptoms: Secondary | ICD-10-CM | POA: Diagnosis not present

## 2022-06-21 DIAGNOSIS — Z794 Long term (current) use of insulin: Secondary | ICD-10-CM

## 2022-06-21 DIAGNOSIS — R300 Dysuria: Secondary | ICD-10-CM

## 2022-06-21 DIAGNOSIS — I10 Essential (primary) hypertension: Secondary | ICD-10-CM

## 2022-06-21 LAB — LIPID PANEL
Cholesterol: 269 mg/dL — ABNORMAL HIGH (ref 0–200)
HDL: 84.8 mg/dL (ref >39.00)
LDL Cholesterol: 171 mg/dL — ABNORMAL HIGH (ref 0–99)
NonHDL: 184.39
Total CHOL/HDL Ratio: 3
Triglycerides: 66 mg/dL (ref 0.0–149.0)
VLDL: 13.2 mg/dL (ref 0.0–40.0)

## 2022-06-21 LAB — COMPREHENSIVE METABOLIC PANEL
ALT: 18 U/L (ref 0–53)
AST: 21 U/L (ref 0–37)
Albumin: 4.1 g/dL (ref 3.5–5.2)
Alkaline Phosphatase: 90 U/L (ref 39–117)
BUN: 16 mg/dL (ref 6–23)
CO2: 29 mEq/L (ref 19–32)
Calcium: 9.2 mg/dL (ref 8.4–10.5)
Chloride: 102 mEq/L (ref 96–112)
Creatinine, Ser: 1.23 mg/dL (ref 0.40–1.50)
GFR: 57.65 mL/min — ABNORMAL LOW (ref >60.00)
Glucose, Bld: 145 mg/dL — ABNORMAL HIGH (ref 70–99)
Potassium: 4.6 mEq/L (ref 3.5–5.1)
Sodium: 139 mEq/L (ref 135–145)
Total Bilirubin: 0.4 mg/dL (ref 0.2–1.2)
Total Protein: 7.2 g/dL (ref 6.0–8.3)

## 2022-06-21 LAB — URINALYSIS, ROUTINE W REFLEX MICROSCOPIC
Bilirubin Urine: NEGATIVE
Ketones, ur: NEGATIVE
Nitrite: NEGATIVE
Specific Gravity, Urine: 1.025 (ref 1.000–1.030)
Total Protein, Urine: 100 — AB
Urine Glucose: NEGATIVE
Urobilinogen, UA: 0.2 (ref 0.0–1.0)
pH: 6 (ref 5.0–8.0)

## 2022-06-21 LAB — CBC
HCT: 36.4 % — ABNORMAL LOW (ref 39.0–52.0)
Hemoglobin: 12.3 g/dL — ABNORMAL LOW (ref 13.0–17.0)
MCHC: 33.9 g/dL (ref 30.0–36.0)
MCV: 91.4 fl (ref 78.0–100.0)
Platelets: 312 10*3/uL (ref 150.0–400.0)
RBC: 3.98 Mil/uL — ABNORMAL LOW (ref 4.22–5.81)
RDW: 14.6 % (ref 11.5–15.5)
WBC: 10 10*3/uL (ref 4.0–10.5)

## 2022-06-21 LAB — MICROALBUMIN / CREATININE URINE RATIO
Creatinine,U: 154.2 mg/dL
Microalb Creat Ratio: 37.8 mg/g — ABNORMAL HIGH (ref 0.0–30.0)
Microalb, Ur: 58.3 mg/dL — ABNORMAL HIGH (ref 0.0–1.9)

## 2022-06-21 LAB — TSH: TSH: 3.45 u[IU]/mL (ref 0.35–5.50)

## 2022-06-21 LAB — HEMOGLOBIN A1C: Hgb A1c MFr Bld: 7.4 % — ABNORMAL HIGH (ref 4.6–6.5)

## 2022-06-21 LAB — PSA: PSA: 6.79 ng/mL — ABNORMAL HIGH (ref 0.10–4.00)

## 2022-06-21 MED ORDER — FREESTYLE LIBRE 3 SENSOR MISC: 1.0000 | 3 refills | Status: DC

## 2022-06-21 NOTE — Patient Instructions (Signed)
It was great seeing you today   We will follow up with you regarding your lab work   Please let me know if you need anything   Please follow up with Dr. Melburn Popper.

## 2022-06-21 NOTE — Progress Notes (Signed)
Subjective:    Patient ID: [name], male    DOB: 10-19-1945, 77 y.o.   MRN: [id number]  HPI  Patient presents for yearly preventative medicine examination. He is a pleasant 77 year old male who  has a past medical history of Aortic stenosis, Blood transfusion without reported diagnosis, Carpal tunnel syndrome (09/24/2014), Coronary artery disease, COVID-19, Diabetes mellitus, type 2 (HCC), Erectile dysfunction, Essential hypertension, Hyperlipidemia, and Palpitations.  Diabetes mellitus type II-currently managed with glipizide 10 mg twice daily, and Lantus 22 units subq twice daily.    He denies hypoglycemia. He reports that he has been without glipizide 10 mg  for the last three months and his blood sugars at home and it has been well controlled.  Lab Results  Component Value Date   HGBA1C 6.6 (A) 03/22/2022   Hypertension -managed with HCTZ 25 mg daily and Lopressor 25 mg daily.  He denies dizziness, lightheadedness, chest pain, or shortness of breath. He does check his BP at home with readings consistently in the 120-130's systolic. He did not take his BP medication this morning.  BP Readings from Last 3 Encounters:  06/21/22 (!) 140/70  06/07/22 (!) 139/52  03/22/22 (!) 142/80   Hyperlipidemia/CAD-managed by cardiology.  Currently prescribed Repatha 140 mg ever 14 days,  and aspirin 81 mg daily. He has not had Repatha in a couple of months, he has not seen Dr. Melburn Popper   Lab Results  Component Value Date   CHOL 146 08/01/2021   HDL 105 08/01/2021   LDLCALC 27 08/01/2021   LDLDIRECT 112.6 05/14/2012   TRIG 75 08/01/2021   CHOLHDL 1.4 08/01/2021   BPH - managed by Urology with Flomax 0.4 mg daily. He has been out of the medication - has an appointment with urology next month.   Dysuria -questionable symptom. He had antibiotics at home and started taking them. This symptom improved but still present.   Mass in left groin - noticed recently when he coughed that he had a bulge  in his left groin. Bulge is not present when he does not cough. No pain or issues with bowel movements.    All immunizations and health maintenance protocols were reviewed with the patient and needed orders were placed.  Appropriate screening laboratory values were ordered for the patient including screening of hyperlipidemia, renal function and hepatic function. If indicated by BPH, a PSA was ordered.  Medication reconciliation,  past medical history, social history, problem list and allergies were reviewed in detail with the patient  Goals were established with regard to weight loss, exercise, and  diet in compliance with medications Wt Readings from Last 3 Encounters:  06/21/22 174 lb (78.9 kg)  06/07/22 175 lb (79.4 kg)  03/22/22 174 lb (78.9 kg)   Review of Systems  Constitutional: Negative.   HENT: Negative.    Eyes: Negative.   Respiratory: Negative.    Cardiovascular: Negative.   Gastrointestinal: Negative.   Endocrine: Negative.   Genitourinary:  Positive for dysuria.  Musculoskeletal: Negative.   Skin: Negative.   Allergic/Immunologic: Negative.   Neurological: Negative.   Hematological: Negative.   Psychiatric/Behavioral: Negative.    All other systems reviewed and are negative.  He is up to date on routine colon cancer screening.   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   Blood transfusion without reported diagnosis    Carpal tunnel syndrome 09/24/2014   Bilateral   Coronary  artery disease    Multivessel status post CABG September 2017   COVID-19    Diabetes mellitus, type 2 (HCC)    Erectile dysfunction    Essential hypertension    Hyperlipidemia    Palpitations    Cardiac monitor 03/2019: normal sinus rhythm, no AF, rare NSVT, VT    Social History   Socioeconomic History   Marital status: Widowed    Spouse name: Not on file   Number of children: 5   Years of education: 10    Highest  education level: 10th grade  Occupational History   Occupation: custodian    Comment: PT  Tobacco Use   Smoking status: Never   Smokeless tobacco: Never  Vaping Use   Vaping Use: Never used  Substance and Sexual Activity   Alcohol use: Yes    Comment: 1 x month   Drug use: No   Sexual activity: Yes  Other Topics Concern   Not on file  Social History Narrative   Widowed to [name] for 44 years; 2 daughters liver with him now   Works part time as Metallurgist   5 children; 1 son deceased   Never Smoked   Alcohol use- no   Caffeine use: occasional       Lost one child a few months ago    Lost his sister, then son and then nephew - all within 2 weeks apart   Son had a stroke at 56    Was planning to get married last month in Feb.    Social Determinants of Health   Financial Resource Strain: Low Risk  (02/21/2022)   Overall Financial Resource Strain (CARDIA)    Difficulty of Paying Living Expenses: Not hard at all  Food Insecurity: No Food Insecurity (02/21/2022)   Hunger Vital Sign    Worried About Running Out of Food in the Last Year: Never true    Ran Out of Food in the Last Year: Never true  Transportation Needs: No Transportation Needs (02/21/2022)   PRAPARE - Administrator, Civil Service (Medical): No    Lack of Transportation (Non-Medical): No  Physical Activity: Insufficiently Active (02/21/2022)   Exercise Vital Sign    Days of Exercise per Week: 3 days    Minutes of Exercise per Session: 30 min  Stress: No Stress Concern Present (02/21/2022)   [employer] of Occupational Health - Occupational Stress Questionnaire    Feeling of Stress : Not at all  Social Connections: Moderately Integrated (02/21/2022)   Social Connection and Isolation Panel [NHANES]    Frequency of Communication with Friends and Family: More than three times a week    Frequency of Social Gatherings with Friends and Family: More than three times a week    Attends Religious  Services: More than 4 times per year    Active Member of [employer] or Organizations: Yes    Attends Banker Meetings: More than 4 times per year    Marital Status: Widowed  Intimate Partner Violence: Not At Risk (02/21/2022)   Humiliation, Afraid, Rape, and Kick questionnaire    Fear of Current or Ex-Partner: No    Emotionally Abused: No    Physically Abused: No    Sexually Abused: No    Past Surgical History:  Procedure Laterality Date   AORTIC VALVE REPLACEMENT N/A 10/29/2015   Procedure: AORTIC VALVE REPLACEMENT (AVR) WITH MAGNA EASE TISSUE VALVE.;  Surgeon: Delight Ovens, MD;  Location: [facility] OR;  Service: Open Heart Surgery;  Laterality: N/A;   CARDIAC CATHETERIZATION N/A 10/27/2015   Procedure: Left Heart Cath and Coronary Angiography;  Surgeon: Marykay Lex, MD;  Location: [facility] INVASIVE CV LAB;  Service: Cardiovascular;  Laterality: N/A;   COLONOSCOPY  09/02/2015   Dr.Pyrtle   CORONARY ARTERY BYPASS GRAFT N/A 10/29/2015   Procedure: CORONARY ARTERY BYPASS GRAFTING (CABG) x Four UTILIZING THE LEFT INTERNAL MAMMARY ARTERY AND ENDOSCOPICALLY HARVESTED RIGHT SAPEHENEOUS VEINS.;  Surgeon: Delight Ovens, MD;  Location: [facility] OR;  Service: Open Heart Surgery;  Laterality: N/A;   CYST REMOVAL HAND Right    ESOPHAGOGASTRODUODENOSCOPY N/A 12/02/2015   Procedure: ESOPHAGOGASTRODUODENOSCOPY (EGD);  Surgeon: Ruffin Frederick, MD;  [facility] END[facility]COPY;  Service: Gastroenterology;  Laterality: N/A;   REPLACEMENT ASCENDING AORTA N/A 10/29/2015   Procedure: REPLACEMENT OF ASCENDING AORTA USING X 30CM WOVEN DOUBLE VELOUR VASCULAR GRAFT.;  Surgeon: Delight Ovens, MD;  Location: [facility] OR;  Service: Open Heart Surgery;  Laterality: N/A;   TEE WITHOUT CARDIOVERSION N/A 10/29/2015   Procedure: TRANSESOPHAGEAL ECHOCARDIOGRAM (TEE);  Surgeon: Delight Ovens, MD;  Location[facility] OR;  Service: Open Heart Surgery;  Laterality: N/A;   UVULECTOMY N/A 12/10/2015   Procedure:  UVULECTOMY;  Surgeon: Flo Shanks, MD;  Location: [facility] OR;  Service: ENT;  Laterality: N/A;    Family History  Problem Relation Age of Onset   Cancer Father        Throat cancer   Diabetes Sister    Colon cancer Neg Hx     Allergies  Allergen Reactions   Amiodarone Nausea And Vomiting   Quinolones     Patient was warned about not using Cipro and similar antibiotics. Recent studies have raised concern that fluoroquinolone antibiotics could be associated with an increased risk of aortic aneurysm Fluoroquinolones have non-antimicrobial properties that might jeopardise the integrity of the extracellular matrix of the vascular wall In a  propensity score matched cohort study in [place], there was a 66% increased rate of aortic aneurysm or dissection associated with oral fluoroquinolone use, compared wit    Current Outpatient Medications on File Prior to Visit  Medication Sig Dispense Refill   Accu-Chek Softclix Lancets lancets Use as instructed 100 each 12   aspirin EC 81 MG tablet Take 81 mg by mouth daily.     Continuous Blood Gluc Sensor (FREESTYLE LIBRE 3 SENSOR) MISC 1 Device by Does not apply route every 14 (fourteen) days. 2 each 11   Evolocumab (REPATHA SURECLICK) 140 MG/ML SOAJ Inject 1 pen. into the skin every 14 (fourteen) days. 6 mL 3   glipiZIDE (GLUCOTROL XL) 10 MG 24 hr tablet Take 1 tablet (10 mg total) by mouth in the morning and at bedtime. 180 tablet 1   hydrochlorothiazide (HYDRODIURIL) 25 MG tablet Take 1 tablet (25 mg total) by mouth daily. 90 tablet 3   Insulin Pen Needle (B-D ULTRAFINE III SHORT PEN) 31G X 8 MM MISC Use BID 200 each 3   metoprolol tartrate (LOPRESSOR) 25 MG tablet TAKE 1 TABLET BY MOUTH TWICE A DAY 30 tablet 0   tamsulosin (FLOMAX) 0.4 MG CAPS capsule Take 1 capsule (0.4 mg total) by mouth daily. 90 capsule 3   insulin glargine (LANTUS SOLOSTAR) 100 UNIT/ML Solostar Pen Inject 22 Units into the skin 2 (two) times daily. 40 mL 1   No current  facility-administered medications on file prior to visit.    BP (!) 140/70   Pulse 70   Temp 98.3 F (36.8  C) (Oral)   Ht 5' 10.15" (1.782 m)   Wt 174 lb (78.9 kg)   SpO2 99%   BMI 24.86 kg/m       Objective:   Physical Exam Vitals and nursing note reviewed.  Constitutional:      General: He is not in acute distress.    Appearance: Normal appearance. He is not ill-appearing.  HENT:     Head: Normocephalic and atraumatic.     Right Ear: Tympanic membrane, ear canal and external ear normal. There is no impacted cerumen.     Left Ear: Tympanic membrane, ear canal and external ear normal. There is no impacted cerumen.     Nose: Nose normal. No congestion or rhinorrhea.     Mouth/Throat:     Mouth: Mucous membranes are moist.     Pharynx: Oropharynx is clear.  Eyes:     Extraocular Movements: Extraocular movements intact.     Conjunctiva/sclera: Conjunctivae normal.     Pupils: Pupils are equal, round, and reactive to light.  Neck:     Vascular: No carotid bruit.  Cardiovascular:     Rate and Rhythm: Normal rate and regular rhythm.     Pulses: Normal pulses.     Heart sounds: No murmur heard.    No friction rub. No gallop.  Pulmonary:     Effort: Pulmonary effort is normal.     Breath sounds: Normal breath sounds.  Abdominal:     General: Abdomen is flat. Bowel sounds are normal. There is no distension.     Palpations: Abdomen is soft. There is no mass.     Tenderness: There is no abdominal tenderness. There is no right CVA tenderness, left CVA tenderness, guarding or rebound.     Hernia: A hernia is present. Hernia is present in the left inguinal area.     Comments: Hernia easily reduces   Musculoskeletal:        General: Normal range of motion.     Cervical back: Normal range of motion and neck supple.  Lymphadenopathy:     Cervical: No cervical adenopathy.  Skin:    General: Skin is warm and dry.     Capillary Refill: Capillary refill takes less than 2 seconds.   Neurological:     General: No focal deficit present.     Mental Status: He is alert and oriented to person, place, and time.  Psychiatric:        Mood and Affect: Mood normal.        Behavior: Behavior normal.        Thought Content: Thought content normal.        Judgment: Judgment normal.       Assessment & Plan:  1. Routine general medical examination at a health care facility Today patient counseled on age appropriate routine health concerns for screening and prevention, each reviewed and up to date or declined. Immunizations reviewed and up to date or declined. Labs ordered and reviewed. Risk factors for depression reviewed and negative. Hearing function and visual acuity are intact. ADLs screened and addressed as needed. Functional ability and level of safety reviewed and appropriate. Education, counseling and referrals performed based on assessed risks today. Patient provided with a copy of personalized plan for preventive services. - Get shingles and Tdap at pharmacy  - Follow up in one year or sooner if needed  2. Essential hypertension - Well controlled. No change in medication  - Lipid panel; Future - TSH; Future - CBC;  Future - Comprehensive metabolic panel; Future - Hemoglobin A1c; Future - Microalbumin/Creatinine Ratio, Urine; Future  3. Type 2 diabetes mellitus with other specified complication, with long-term current use of insulin (HCC) - Consider increase in insulin  - Follow up in 3 or 6 months depending on labs  - Lipid panel; Future - TSH; Future - CBC; Future - Comprehensive metabolic panel; Future - Hemoglobin A1c; Future - Microalbumin/Creatinine Ratio, Urine; Future  4. Mixed hyperlipidemia - Needs to follow up with Cardiology to get refill of Repatha - Lipid panel; Future - TSH; Future - CBC; Future - Comprehensive metabolic panel; Future - Hemoglobin A1c; Future  5. Coronary artery disease involving coronary bypass graft of native heart without  angina pectoris - Follow up with Cardiology as directed - Lipid panel; Future - TSH; Future - CBC; Future - Comprehensive metabolic panel; Future - Hemoglobin A1c; Future  6. Benign prostatic hyperplasia with nocturia - Follow up with Urology as directed - PSA; Future  7. Dysuria - Advised he needs to take abx as prescribed  - Urine Culture; Future - Urinalysis; Future  8. Non-recurrent unilateral inguinal hernia without obstruction or gangrene - Watchful waiting  Shirline Frees, NP

## 2022-06-22 ENCOUNTER — Other Ambulatory Visit: Payer: Self-pay | Admitting: Adult Health

## 2022-06-22 MED ORDER — EMPAGLIFLOZIN 10 MG PO TABS: 10.0000 mg | ORAL_TABLET | Freq: Every day | ORAL | 0 refills | Status: DC

## 2022-06-22 NOTE — Addendum Note (Signed)
Addended by: Waymon Amato R on: 06/22/2022 03:23 PM   Modules accepted: Orders

## 2022-06-24 LAB — URINE CULTURE
MICRO NUMBER:: 14929850
SPECIMEN QUALITY:: ADEQUATE

## 2022-06-26 DIAGNOSIS — E113312 Type 2 diabetes mellitus with moderate nonproliferative diabetic retinopathy with macular edema, left eye: Secondary | ICD-10-CM | POA: Diagnosis not present

## 2022-06-27 ENCOUNTER — Other Ambulatory Visit: Payer: Self-pay | Admitting: Adult Health

## 2022-06-27 MED ORDER — AMOXICILLIN-POT CLAVULANATE 875-125 MG PO TABS: 1.0000 | ORAL_TABLET | Freq: Two times a day (BID) | ORAL | 0 refills | Status: AC

## 2022-06-27 MED ORDER — DAPAGLIFLOZIN PROPANEDIOL 5 MG PO TABS: 5.0000 mg | ORAL_TABLET | Freq: Every day | ORAL | 0 refills | Status: DC

## 2022-06-29 ENCOUNTER — Other Ambulatory Visit: Payer: Self-pay

## 2022-06-29 MED ORDER — EMPAGLIFLOZIN 10 MG PO TABS: 10.0000 mg | ORAL_TABLET | Freq: Every day | ORAL | 0 refills | Status: DC

## 2022-07-05 ENCOUNTER — Telehealth: Payer: Self-pay

## 2022-07-05 ENCOUNTER — Other Ambulatory Visit (HOSPITAL_COMMUNITY): Payer: Self-pay

## 2022-07-05 NOTE — Telephone Encounter (Signed)
Pharmacy Patient Advocate Encounter  Prior Authorization for [employer] 3 sensors has been approved by [employer]).    PA # [id number] Effective dates: 02/13/2022 through 02/13/2023

## 2022-07-05 NOTE — Telephone Encounter (Signed)
Patient Advocate Encounter   Received notification from [facility] that prior authorization for FREESTYLE LIBRE SENSOR is required.   PA submitted on 07/05/2022 Key [id number] Insurance Francine Graven Status is pending

## 2022-07-06 NOTE — Telephone Encounter (Signed)
Tried to call pt to advise of update but no answer.  ?

## 2022-08-03 ENCOUNTER — Ambulatory Visit: Payer: Medicare PPO | Admitting: Urology

## 2022-08-03 ENCOUNTER — Encounter: Payer: Self-pay | Admitting: Urology

## 2022-08-03 VITALS — BP 128/74 | HR 84 | Ht 70.0 in | Wt 174.0 lb

## 2022-08-03 DIAGNOSIS — N401 Enlarged prostate with lower urinary tract symptoms: Secondary | ICD-10-CM

## 2022-08-03 DIAGNOSIS — R3912 Poor urinary stream: Secondary | ICD-10-CM | POA: Diagnosis not present

## 2022-08-03 DIAGNOSIS — K4090 Unilateral inguinal hernia, without obstruction or gangrene, not specified as recurrent: Secondary | ICD-10-CM | POA: Diagnosis not present

## 2022-08-03 DIAGNOSIS — R8271 Bacteriuria: Secondary | ICD-10-CM | POA: Diagnosis not present

## 2022-08-03 DIAGNOSIS — R972 Elevated prostate specific antigen [PSA]: Secondary | ICD-10-CM | POA: Diagnosis not present

## 2022-08-03 LAB — MICROSCOPIC EXAMINATION: WBC, UA: >30 /hpf — AB (ref 0–5)

## 2022-08-03 LAB — URINALYSIS, COMPLETE
Bilirubin, UA: NEGATIVE
Ketones, UA: NEGATIVE
Nitrite, UA: POSITIVE — AB
Specific Gravity, UA: 1.01 (ref 1.005–1.030)
Urobilinogen, Ur: 0.2 mg/dL (ref 0.2–1.0)
pH, UA: 5 (ref 5.0–7.5)

## 2022-08-03 MED ORDER — TAMSULOSIN HCL 0.4 MG PO CAPS: 0.4000 mg | ORAL_CAPSULE | Freq: Every day | ORAL | 3 refills | Status: DC

## 2022-08-03 NOTE — Progress Notes (Signed)
Marcelle Overlie Plume,acting as a scribe for Riki Altes, MD.,have documented all relevant documentation on the behalf of Riki Altes, MD,as directed by  Riki Altes, MD while in the presence of Riki Altes, MD.  08/03/2022 10:11 AM   [name] 1945/08/13 [id number]  Referring provider: Shirline Frees, NP [address] [place],  [place] [address]  Chief Complaint  Patient presents with   Benign Prostatic Hypertrophy    Urologic history: 1.  Gross hematuria January 2023 in association with UTI CTU 04/2021 no upper tract abnormalities Cystoscopy BPH with trilobar enlargement  2.  BPH with LUTS Started tamsulosin 04/2021   HPI: 77 y.o. male presents for annual follow-up.  Saw PCP 06/21/2022 for a routine follow up and was complaining of dysuria. Urine culture showed significant pyuria and was positive for ESBL Klebsiella His PSA was checked during that visit and not surprisingly, with an ongoing UTI, was elevated at 6.79. 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   Blood transfusion without reported diagnosis    Carpal tunnel syndrome 09/24/2014   Bilateral   Coronary artery disease    Multivessel status post CABG September 2017   COVID-19    Diabetes mellitus, type 2 (HCC)    Erectile dysfunction    Essential hypertension    Hyperlipidemia    Palpitations    Cardiac monitor 03/2019: normal sinus rhythm, no AF, rare NSVT, VT    Surgical History: Past Surgical History:  Procedure Laterality Date   AORTIC VALVE REPLACEMENT N/A 10/29/2015   Procedure: AORTIC VALVE REPLACEMENT (AVR) WITH MAGNA EASE TISSUE VALVE.;  Surgeon: Delight Ovens, MD;  Location: [facility] OR;  Service: Open Heart Surgery;  Laterality: N/A;   CARDIAC CATHETERIZATION N/A 10/27/2015    Procedure: Left Heart Cath and Coronary Angiography;  Surgeon: Marykay Lex, MD;  Location: [facility] INVASIVE CV LAB;  Service: Cardiovascular;  Laterality: N/A;   COLONOSCOPY  09/02/2015   Dr.Pyrtle   CORONARY ARTERY BYPASS GRAFT N/A 10/29/2015   Procedure: CORONARY ARTERY BYPASS GRAFTING (CABG) x Four UTILIZING THE LEFT INTERNAL MAMMARY ARTERY AND ENDOSCOPICALLY HARVESTED RIGHT SAPEHENEOUS VEINS.;  Surgeon: Delight Ovens, MD;  Location: [facility] OR;  Service: Open Heart Surgery;  Laterality: N/A;   CYST REMOVAL HAND Right    ESOPHAGOGASTRODUODENOSCOPY N/A 12/02/2015   Procedure: ESOPHAGOGASTRODUODENOSCOPY (EGD);  Surgeon: Ruffin Frederick, MD;  Location: [facility] ENDOSCOPY;  Service: Gastroenterology;  Laterality: N/A;   REPLACEMENT ASCENDING AORTA N/A 10/29/2015   Procedure: REPLACEMENT OF ASCENDING AORTA USING X 30CM WOVEN DOUBLE VELOUR VASCULAR GRAFT.;  Surgeon: Delight Ovens, MD;  Location: [facility] OR;  Service: Open Heart Surgery;  Laterality: N/A;   TEE WITHOUT CARDIOVERSION N/A 10/29/2015   Procedure: TRANSESOPHAGEAL ECHOCARDIOGRAM (TEE);  Surgeon: Delight Ovens, MD;  Location: [facility] OR;  Service: Open Heart Surgery;  Laterality: N/A;   UVULECTOMY N/A 12/10/2015   Procedure: UVULECTOMY;  Surgeon: Flo Shanks, MD;  Location: [facility] OR;  Service: ENT;  Laterality: N/A;    Home Medications:  Allergies as of 07/29/2021       Reactions   Amiodarone Nausea And Vomiting   Quinolones    Patient was warned about not using Cipro and similar antibiotics. Recent studies have raised concern that fluoroquinolone antibiotics could be  associated with an increased risk of aortic aneurysm Fluoroquinolones have non-antimicrobial properties that might jeopardise the integrity of the extracellular matrix of the vascular wall In a  propensity score matched cohort study in [place], there was a 66% increased rate of aortic aneurysm or dissection associated with oral fluoroquinolone use, compared wit         Medication List        Accurate as of July 29, 2021 10:11 AM. If you have any questions, ask your nurse or doctor.          Accu-Chek Guide Me w/Device Kit 1 Stick by Does not apply route 3 (three) times daily. Used to check blood sugar 3 times daily   Accu-Chek Guide test strip Generic drug: glucose blood Use as instructed   Accu-Chek Softclix Lancets lancets Use as instructed   aspirin EC 81 MG tablet Take 81 mg by mouth daily.   B-D ULTRAFINE III SHORT PEN 31G X 8 MM Misc Generic drug: Insulin Pen Needle Use BID   FreeStyle Libre 2 Sensor Misc 1 Device by Does not apply route every 14 (fourteen) days.   glipiZIDE 10 MG 24 hr tablet Commonly known as: GLUCOTROL XL TAKE 1 TABLET (10 MG TOTAL) BY MOUTH IN THE MORNING AND AT BEDTIME   hydrochlorothiazide 25 MG tablet Commonly known as: HYDRODIURIL Take 1 tablet (25 mg total) by mouth daily.   Lantus SoloStar 100 UNIT/ML Solostar Pen Generic drug: insulin glargine Inject 22 Units into the skin 2 (two) times daily.   metFORMIN 1000 MG tablet Commonly known as: GLUCOPHAGE TAKE 1 TABLET (1,000 MG TOTAL) BY MOUTH 2 (TWO) TIMES DAILY WITH A MEAL.   metoprolol tartrate 25 MG tablet Commonly known as: LOPRESSOR TAKE 1 TABLET BY MOUTH TWICE A DAY   Repatha SureClick 140 MG/ML Soaj Generic drug: Evolocumab Inject 1 pen. into the skin every 14 (fourteen) days.   rosuvastatin 40 MG tablet Commonly known as: CRESTOR TAKE 1 TABLET BY MOUTH EVERYDAY AT BEDTIME   tamsulosin 0.4 MG Caps capsule Commonly known as: FLOMAX Take 1 capsule (0.4 mg total) by mouth daily.   Trulicity 0.75 MG/0.5ML Sopn Generic drug: Dulaglutide INJECT 0.75 MG SUBCUTANEOUSLY ONE TIME PER WEEK        Allergies:  Allergies  Allergen Reactions   Amiodarone Nausea And Vomiting   Quinolones     Patient was warned about not using Cipro and similar antibiotics. Recent studies have raised concern that fluoroquinolone antibiotics could  be associated with an increased risk of aortic aneurysm Fluoroquinolones have non-antimicrobial properties that might jeopardise the integrity of the extracellular matrix of the vascular wall In a  propensity score matched cohort study in [place], there was a 66% increased rate of aortic aneurysm or dissection associated with oral fluoroquinolone use, compared wit    Family History: Family History  Problem Relation Age of Onset   Cancer Father        Throat cancer   Diabetes Sister    Colon cancer Neg Hx     Social History:  reports that he has never smoked. He has never used smokeless tobacco. He reports current alcohol use. He reports that he does not use drugs.   Physical Exam: BP (!) 145/72 (BP Location: Left Arm, Patient Position: Sitting, Cuff Size: Normal)   Pulse 80   Ht 5' 10.5" (1.791 m)   Wt 172 lb 9.6 oz (78.3 kg)   BMI 24.42 kg/m   Constitutional:  Alert and oriented, No acute  distress. HEENT: [place] AT Abdomen: Soft, non-tender; small, easily reducible left inguinal hernia GU: Phallus without lesions, testes descended bilaterally, slightly atrophic, no mass.  GU: Prostate 35-40 grams, flat, smooth without nodules or induration Respiratory: Normal respiratory effort, no increased work of breathing. Psychiatric: Normal mood and affect.  Laboratory data:   Urinalysis Dipstick: 2+ glucose/ nitrate positive/ 1+ leukocyte/ 1+ protein Microscopy: >30 WBC  Assessment & Plan:    1. BPH with LUTS PVR 12mL Tamsulosin refilled  2. Recurrent bacteriuria When diagnosed last month, he was complaining of dysuria but is currently asymptomatic Repeat urine culture today  3. Left inguinal hernia Reducible and minimally asymptomatic; no treatment recommended. We did discuss signs of potential incarceration.  4. Elevated PSA PSA was drawn at the time of an active infection, which is the most likely etiology Repeat PSA once urine is clear  I have reviewed the above  documentation for accuracy and completeness, and I agree with the above.   Riki Altes, MD  [facility] [address] [place], [place] [address] [phone]

## 2022-08-07 DIAGNOSIS — E113311 Type 2 diabetes mellitus with moderate nonproliferative diabetic retinopathy with macular edema, right eye: Secondary | ICD-10-CM | POA: Diagnosis not present

## 2022-08-08 LAB — CULTURE, URINE COMPREHENSIVE

## 2022-09-01 ENCOUNTER — Other Ambulatory Visit: Payer: Self-pay | Admitting: *Deleted

## 2022-09-01 ENCOUNTER — Other Ambulatory Visit: Payer: Medicare PPO

## 2022-09-01 DIAGNOSIS — Z87898 Personal history of other specified conditions: Secondary | ICD-10-CM

## 2022-09-05 ENCOUNTER — Encounter: Payer: Self-pay | Admitting: Urology

## 2022-09-05 ENCOUNTER — Telehealth: Payer: Medicare PPO | Admitting: Physician Assistant

## 2022-09-05 DIAGNOSIS — E113312 Type 2 diabetes mellitus with moderate nonproliferative diabetic retinopathy with macular edema, left eye: Secondary | ICD-10-CM | POA: Diagnosis not present

## 2022-09-05 DIAGNOSIS — R102 Pelvic and perineal pain: Secondary | ICD-10-CM

## 2022-09-05 NOTE — Progress Notes (Signed)
E-Visit for Urinary Problems ? ?Based on what you shared with me, I feel your condition warrants further evaluation and I recommend that you be seen for a face to face office visit.  Male bladder infections are not very common.  We worry about prostate or kidney conditions.  The standard of care is to examine the abdomen and kidneys, and to do a urine and blood test to make sure that something more serious is not going on.  We recommend that you see a provider today.  If your doctor's office is closed [facility] has the following Urgent Cares: ? ?  ?NOTE: You will not be charged for this e-visit. ? ?If you are having a true medical emergency please call 911.   ? ?  ? For an urgent face to face visit, [facility] has six urgent care centers for your convenience:  ?  ? [facility] [place] ?Get Driving Directions ?[phone] ?[address] ?[place], [place] [address] ?  ? [facility] ([place]) ?Get Driving Directions ?[phone] ?[address] ?[place], [place] [address] ? ?[facility] ([place]) ?Get Driving Directions ?[phone] ?[address] ?[place],  [place]  [address] ? ?[facility] ?Get Driving Directions ?[phone] ?[address] ?[place], [place] [address] ?  ?[facility] ?Get Driving Directions  ?[phone] ?[address] ?[address] ?[place], [place] [address] ?  ? [facility] at [place] ?Get Driving Directions ?[phone] ?[address] ?[place], [place] [address] ? ?Your MyChart E-visit questionnaire answers were reviewed by a board certified advanced clinical practitioner to complete your personal care plan based on your specific symptoms.  Thank you for using e-Visits. ?

## 2022-09-07 ENCOUNTER — Encounter (HOSPITAL_BASED_OUTPATIENT_CLINIC_OR_DEPARTMENT_OTHER): Payer: Self-pay | Admitting: Emergency Medicine

## 2022-09-07 ENCOUNTER — Emergency Department (HOSPITAL_BASED_OUTPATIENT_CLINIC_OR_DEPARTMENT_OTHER): Payer: Medicare PPO

## 2022-09-07 ENCOUNTER — Other Ambulatory Visit: Payer: Self-pay

## 2022-09-07 ENCOUNTER — Ambulatory Visit: Payer: Medicare PPO

## 2022-09-07 ENCOUNTER — Emergency Department (HOSPITAL_BASED_OUTPATIENT_CLINIC_OR_DEPARTMENT_OTHER)
Admission: EM | Admit: 2022-09-07 | Discharge: 2022-09-07 | Disposition: A | Discharge status: Home / Self Care | Payer: Medicare PPO | Attending: Emergency Medicine | Admitting: Emergency Medicine

## 2022-09-07 DIAGNOSIS — E119 Type 2 diabetes mellitus without complications: Secondary | ICD-10-CM | POA: Insufficient documentation

## 2022-09-07 DIAGNOSIS — N451 Epididymitis: Secondary | ICD-10-CM | POA: Diagnosis not present

## 2022-09-07 DIAGNOSIS — I10 Essential (primary) hypertension: Secondary | ICD-10-CM | POA: Diagnosis not present

## 2022-09-07 DIAGNOSIS — N503 Cyst of epididymis: Secondary | ICD-10-CM | POA: Diagnosis not present

## 2022-09-07 DIAGNOSIS — Z7982 Long term (current) use of aspirin: Secondary | ICD-10-CM | POA: Insufficient documentation

## 2022-09-07 DIAGNOSIS — Z7984 Long term (current) use of oral hypoglycemic drugs: Secondary | ICD-10-CM | POA: Insufficient documentation

## 2022-09-07 DIAGNOSIS — Z794 Long term (current) use of insulin: Secondary | ICD-10-CM | POA: Insufficient documentation

## 2022-09-07 DIAGNOSIS — I2510 Atherosclerotic heart disease of native coronary artery without angina pectoris: Secondary | ICD-10-CM | POA: Insufficient documentation

## 2022-09-07 DIAGNOSIS — Z79899 Other long term (current) drug therapy: Secondary | ICD-10-CM | POA: Insufficient documentation

## 2022-09-07 DIAGNOSIS — N433 Hydrocele, unspecified: Secondary | ICD-10-CM | POA: Diagnosis not present

## 2022-09-07 DIAGNOSIS — N5089 Other specified disorders of the male genital organs: Secondary | ICD-10-CM | POA: Diagnosis not present

## 2022-09-07 DIAGNOSIS — R222 Localized swelling, mass and lump, trunk: Secondary | ICD-10-CM | POA: Diagnosis present

## 2022-09-07 LAB — CBC
HCT: 38.8 % — ABNORMAL LOW (ref 39.0–52.0)
Hemoglobin: 13.1 g/dL (ref 13.0–17.0)
MCH: 30.6 pg (ref 26.0–34.0)
MCHC: 33.8 g/dL (ref 30.0–36.0)
MCV: 90.7 fL (ref 80.0–100.0)
Platelets: 244 10*3/uL (ref 150–400)
RBC: 4.28 MIL/uL (ref 4.22–5.81)
RDW: 13.4 % (ref 11.5–15.5)
WBC: 9.3 10*3/uL (ref 4.0–10.5)
nRBC: 0 % (ref 0.0–0.2)

## 2022-09-07 LAB — URINALYSIS, ROUTINE W REFLEX MICROSCOPIC
Bilirubin Urine: NEGATIVE
Glucose, UA: >1000 mg/dL — AB
Hgb urine dipstick: NEGATIVE
Ketones, ur: NEGATIVE mg/dL
Nitrite: POSITIVE — AB
Specific Gravity, Urine: 1.025 (ref 1.005–1.030)
WBC, UA: >50 WBC/hpf (ref 0–5)
pH: 5 (ref 5.0–8.0)

## 2022-09-07 LAB — BASIC METABOLIC PANEL
Anion gap: 11 (ref 5–15)
BUN: 20 mg/dL (ref 8–23)
CO2: 31 mmol/L (ref 22–32)
Calcium: 9.8 mg/dL (ref 8.9–10.3)
Chloride: 95 mmol/L — ABNORMAL LOW (ref 98–111)
Creatinine, Ser: 1.31 mg/dL — ABNORMAL HIGH (ref 0.61–1.24)
GFR, Estimated: 57 mL/min — ABNORMAL LOW (ref >60)
Glucose, Bld: 66 mg/dL — ABNORMAL LOW (ref 70–99)
Potassium: 3.4 mmol/L — ABNORMAL LOW (ref 3.5–5.1)
Sodium: 137 mmol/L (ref 135–145)

## 2022-09-07 LAB — CBG MONITORING, ED: Glucose-Capillary: 95 mg/dL (ref 70–99)

## 2022-09-07 MED ORDER — LEVOFLOXACIN 500 MG PO TABS: 500.0000 mg | ORAL_TABLET | Freq: Every day | ORAL | 0 refills | Status: DC

## 2022-09-07 MED ORDER — ONDANSETRON HCL 4 MG PO TABS: 4.0000 mg | ORAL_TABLET | Freq: Four times a day (QID) | ORAL | 0 refills | Status: DC | PRN

## 2022-09-07 MED ORDER — ACETAMINOPHEN 325 MG PO TABS
ORAL_TABLET | ORAL | Status: AC
  Administered 2022-09-07: 650 mg via ORAL
  Filled 2022-09-07: qty 2

## 2022-09-07 MED ORDER — LEVOFLOXACIN 500 MG PO TABS
500.0000 mg | ORAL_TABLET | Freq: Once | ORAL | Status: AC
  Administered 2022-09-07: 500 mg via ORAL
  Filled 2022-09-07: qty 1

## 2022-09-07 NOTE — ED Triage Notes (Signed)
Pt endorses testicle swelling since yesterday. Tenderness with touch. Decreased urination. Denies abd pain.

## 2022-09-07 NOTE — ED Provider Notes (Signed)
[place] EMERGENCY DEPARTMENT AT [facility] Provider Note   CSN: [id number] Arrival date & time: 09/07/22  1836     History  Chief Complaint  Patient presents with   Groin Swelling    [name] is a 77 y.o. male with medical history erectile dysfunction, hypertension, palpitations, diabetes type 2, CAD.  Patient presents to ED for evaluation of right-sided testicular swelling.  The patient reports that beginning Monday he noticed swelling to his right testicle.  He states that the testicle has remained swollen since this time.  He states that at the end of his urine stream he will have slight burning with urination.  He denies any penile discharge, he states he is not sexually active.  He denies history of the same.  He states that occasionally this pain will radiate from his right groin to his right flank.  He is endorsing 1 episode of nausea yesterday but denies vomiting.  Denies fevers, body aches or chills.  States that it feels like a "ache, throb" in his testicles.  HPI     Home Medications Prior to Admission medications   Medication Sig Start Date End Date Taking? Authorizing Provider  levofloxacin (LEVAQUIN) 500 MG tablet Take 1 tablet (500 mg total) by mouth daily. 09/07/22  Yes Al Decant, PA-C  ondansetron (ZOFRAN) 4 MG tablet Take 1 tablet (4 mg total) by mouth every 6 (six) hours as needed for nausea or vomiting. 09/07/22  Yes Al Decant, PA-C  Accu-Chek Softclix Lancets lancets Use as instructed 01/18/21   Shirline Frees, NP  aspirin EC 81 MG tablet Take 81 mg by mouth daily.    [provider]  Continuous Glucose Sensor (FREESTYLE LIBRE 3 SENSOR) MISC 1 Device by Does not apply route every 14 (fourteen) days. Place 1 sensor on the skin every 14 days. Use to check glucose continuously 06/21/22   Nafziger, Kandee Keen, NP  empagliflozin (JARDIANCE) 10 MG TABS tablet Take 1 tablet (10 mg total) by mouth daily before breakfast. 06/29/22    Nafziger, Kandee Keen, NP  Evolocumab (REPATHA SURECLICK) 140 MG/ML SOAJ Inject 1 pen. into the skin every 14 (fourteen) days. 05/03/21   [name], Deloris Ping, MD  glipiZIDE (GLUCOTROL XL) 10 MG 24 hr tablet Take 1 tablet (10 mg total) by mouth in the morning and at bedtime. 03/22/22   Nafziger, Kandee Keen, NP  hydrochlorothiazide (HYDRODIURIL) 25 MG tablet Take 1 tablet (25 mg total) by mouth daily. 03/22/22   Nafziger, Kandee Keen, NP  insulin glargine (LANTUS SOLOSTAR) 100 UNIT/ML Solostar Pen Inject 22 Units into the skin 2 (two) times daily. 03/22/22 06/20/22  Nafziger, Kandee Keen, NP  Insulin Pen Needle (B-D ULTRAFINE III SHORT PEN) 31G X 8 MM MISC Use BID 03/22/22   Nafziger, Kandee Keen, NP  metoprolol tartrate (LOPRESSOR) 25 MG tablet TAKE 1 TABLET BY MOUTH TWICE A DAY 04/28/22   [name], Deloris Ping, MD  tamsulosin (FLOMAX) 0.4 MG CAPS capsule Take 1 capsule (0.4 mg total) by mouth daily. 08/03/22   Riki Altes, MD      Allergies    Amiodarone and Quinolones    Review of Systems   Review of Systems  Constitutional:  Negative for fever.  Gastrointestinal:  Positive for nausea. Negative for vomiting.  Genitourinary:  Positive for dysuria and scrotal swelling. Negative for penile discharge.  All other systems reviewed and are negative.   Physical Exam Updated Vital Signs BP (!) 151/67   Pulse 62   Temp 98.5 F (36.9 C) (  Oral)   Resp 18   Ht 5\' 10"  (1.778 m)   Wt 78.9 kg   SpO2 100%   BMI 24.96 kg/m  Physical Exam Vitals and nursing note reviewed.  Constitutional:      General: He is not in acute distress.    Appearance: Normal appearance. He is not ill-appearing, toxic-appearing or diaphoretic.  HENT:     Head: Normocephalic and atraumatic.     Nose: Nose normal.     Mouth/Throat:     Mouth: Mucous membranes are moist.     Pharynx: Oropharynx is clear.  Eyes:     Extraocular Movements: Extraocular movements intact.     Conjunctiva/sclera: Conjunctivae normal.     Pupils: Pupils are equal, round, and reactive to  light.  Cardiovascular:     Rate and Rhythm: Normal rate and regular rhythm.  Pulmonary:     Effort: Pulmonary effort is normal.     Breath sounds: Normal breath sounds. No wheezing.  Abdominal:     General: Abdomen is flat. Bowel sounds are normal.     Palpations: Abdomen is soft.     Tenderness: There is no abdominal tenderness.  Genitourinary:    Comments: Right-sided testicular swelling.  Cremasteric reflex present.  No penile discharge. Musculoskeletal:     Cervical back: Normal range of motion and neck supple. No tenderness.  Skin:    General: Skin is warm and dry.     Capillary Refill: Capillary refill takes less than 2 seconds.  Neurological:     Mental Status: He is alert and oriented to person, place, and time.     ED Results / Procedures / Treatments   Labs (all labs ordered are listed, but only abnormal results are displayed) Labs Reviewed  URINALYSIS, ROUTINE W REFLEX MICROSCOPIC - Abnormal; Notable for the following components:      Result Value   Glucose, UA >1,000 (*)    Protein, ur TRACE (*)    Nitrite POSITIVE (*)    Leukocytes,Ua MODERATE (*)    Bacteria, UA FEW (*)    All other components within normal limits  CBC - Abnormal; Notable for the following components:   HCT 38.8 (*)    All other components within normal limits  BASIC METABOLIC PANEL - Abnormal; Notable for the following components:   Potassium 3.4 (*)    Chloride 95 (*)    Glucose, Bld 66 (*)    Creatinine, Ser 1.31 (*)    GFR, Estimated 57 (*)    All other components within normal limits  URINE CULTURE  CBG MONITORING, ED    EKG None  Radiology US SCROTUM W/DOPPLER  Result Date: 09/07/2022 CLINICAL DATA:  Testicular swelling since yesterday EXAM: SCROTAL ULTRASOUND DOPPLER ULTRASOUND OF THE TESTICLES TECHNIQUE: Complete ultrasound examination of the testicles, epididymis, and other scrotal structures was performed. Color and spectral Doppler ultrasound were also utilized to  evaluate blood flow to the testicles. COMPARISON:  None Available. FINDINGS: Right testicle Measurements: 2.9 x 2.5 x 2.5 cm. No mass or microlithiasis visualized. The right scrotal wall is thickened compared to the left. Left testicle Measurements: 3.2 x 2.0 x 2.1 cm. No mass or microlithiasis visualized. Right epididymis:  Hypervascular and enlarged compared to the left. Left epididymis: Normal in size and appearance. 5 mm epididymal head cyst. Hydrocele:  Right-greater-than-left hydroceles. Varicocele:  Left varicocele. Pulsed Doppler interrogation of both testes demonstrates normal low resistance arterial and venous waveforms bilaterally. IMPRESSION: Right epididymitis. Electronically Signed   By: Joselyn Glassman  Stutzman M.D.   On: 09/07/2022 22:35    Procedures Procedures   Medications Ordered in ED Medications  levofloxacin (LEVAQUIN) tablet 500 mg (has no administration in time range)    ED Course/ Medical Decision Making/ A&P  Medical Decision Making Amount and/or Complexity of Data Reviewed Labs: ordered. Radiology: ordered.   77 year old male presents to the ED for evaluation of right-sided testicle swelling.  Please see HPI for further details.  On examination patient is afebrile and nontachycardic.  Lung sounds are clear bilaterally and he is not hypoxic.  Abdomen is soft and compressible throughout.  Neurological examination at baseline.  Patient right testicle is swollen compared to left.  Cremasteric reflexes present.  There is erythema to his right testicle.  CBC shows no leukocytosis, no anemia.  Metabolic panel shows potassium 3.4, creatinine 1.31 which is baseline.  Anion gap 11.  Patient urinalysis shows glucose, nitrites and leukocytes.  Patient urine to be cultured.  Patient ultrasound imaging of the testicles does not show any evidence of torsion however there is right-sided epididymitis.  Patient was given initial dose of Levaquin here.  The patient was sent home on 10 days of  500 mg of Levaquin which you will take once daily.  He will follow-up for reevaluation with his primary care doctor.  Of note, the patient glucose on metabolic panel was 66.  Patient was given ginger ale and had CBG drawn again which shows glucose of 95.  Patient given return precautions and he voiced understanding.  Patient had all of his questions answered to his satisfaction.  The patient will follow-up with his PCP.  He will return to the ED with any new or worsening signs or symptoms.  Patient stable to discharge home.   Final Clinical Impression(s) / ED Diagnoses Final diagnoses:  Epididymitis    Rx / DC Orders ED Discharge Orders          Ordered    levofloxacin (LEVAQUIN) 500 MG tablet  Daily        09/07/22 2302    ondansetron (ZOFRAN) 4 MG tablet  Every 6 hours PRN        09/07/22 2302              Al Decant, PA-C 09/07/22 2306    Elayne Snare K, DO 09/07/22 2333

## 2022-09-07 NOTE — Discharge Instructions (Addendum)
It was a pleasure taking part in your care today.  As we discussed, the ultrasound we did shows evidence of epididymitis.  I have attached a guide to this form explaining epididymitis in detail.  You will take Levaquin 500 mg for the next 10 days.  You received your first dose here tonight in the department.  You may also take Zofran every 6 hours as needed for nausea.  Please follow-up with your PCP for reevaluation.  Please return to the ED with any new or worsening signs or symptoms.

## 2022-09-11 ENCOUNTER — Telehealth (HOSPITAL_BASED_OUTPATIENT_CLINIC_OR_DEPARTMENT_OTHER): Payer: Self-pay | Admitting: *Deleted

## 2022-09-11 NOTE — Telephone Encounter (Signed)
Post ED Visit - Positive Culture Follow-up: Unsuccessful Patient Follow-up  Culture assessed and recommendations reviewed by:  [x]  Lysle Pearl, Pharm.D. []  Celedonio Miyamoto, Pharm.D., BCPS AQ-ID []  Garvin Fila, Pharm.D., BCPS []  Georgina Pillion, Pharm.D., BCPS []  [place], [address].D., BCPS, AAHIVP []  Estella Husk, Pharm.D., BCPS, AAHIVP []  Sherlynn Carbon, PharmD []  Pollyann Samples, PharmD, BCPS  Positive urine culture  []  Patient discharged without antimicrobial prescription and treatment is now indicated []  Organism is resistant to prescribed ED discharge antimicrobial []  Patient with positive blood cultures  Plan:  Symptom check.  If no improvement return to ED for further evaluation.  Valrie Hart, PA-C  Unable to contact patient after 3 attempts, letter will be sent to address on file  Lysle Pearl 09/11/2022, 9:29 AM

## 2022-09-11 NOTE — Progress Notes (Signed)
ED Antimicrobial Stewardship Positive Culture Follow Up   [name] is an 77 y.o. male who presented to [facility] on 09/07/2022 with a chief complaint of  Chief Complaint  Patient presents with   Groin Swelling    Recent Results (from the past 720 hour(s))  Urine Culture (for pregnant, neutropenic or urologic patients or patients with an indwelling urinary catheter)     Status: Abnormal   Collection Time: 09/07/22  6:52 PM   Specimen: Urine, Clean Catch  Result Value Ref Range Status   Specimen Description   Final    URINE, CLEAN CATCH Performed at [facility], [address] [place], [place] [address]    Special Requests   Final    NONE Performed at [facility], [address], [place], [place] [address]    Culture (A)  Final    >=100,000 COLONIES/mL KLEBSIELLA PNEUMONIAE Confirmed Extended Spectrum Beta-Lactamase Producer (ESBL).  In bloodstream infections from ESBL organisms, carbapenems are preferred over piperacillin/tazobactam. They are shown to have a lower risk of mortality.    Report Status 09/10/2022 FINAL  Final   Organism ID, Bacteria KLEBSIELLA PNEUMONIAE (A)  Final      Susceptibility   Klebsiella pneumoniae - MIC*    AMPICILLIN >=32 RESISTANT Resistant     CEFAZOLIN >=64 RESISTANT Resistant     CEFEPIME >=32 RESISTANT Resistant     CEFTRIAXONE >=64 RESISTANT Resistant     CIPROFLOXACIN 0.5 INTERMEDIATE Intermediate     GENTAMICIN <=1 SENSITIVE Sensitive     IMIPENEM 0.5 SENSITIVE Sensitive     NITROFURANTOIN 64 INTERMEDIATE Intermediate     TRIMETH/SULFA >=320 RESISTANT Resistant     AMPICILLIN/SULBACTAM >=32 RESISTANT Resistant     PIP/TAZO 32 INTERMEDIATE Intermediate     * >=100,000 COLONIES/mL KLEBSIELLA PNEUMONIAE    [x]  Treated with levaquin, organism resistant to prescribed antimicrobial []  Patient discharged originally without antimicrobial agent and treatment is now indicated  New antibiotic  prescription: Check with patient if having symptoms. If abdominal pain, fever, not feeling well or other UTI symptoms, return to the hospital for treatment.  ED Provider: Valrie Hart, PA-C   Dionisia Pacholski, Drake Leach 09/11/2022, 9:16 AM Clinical Pharmacist Monday - Friday phone -  [phone] Saturday - Sunday phone - [phone]

## 2022-09-13 ENCOUNTER — Telehealth: Payer: Self-pay

## 2022-09-13 NOTE — Telephone Encounter (Signed)
Transition Care Management Unsuccessful Follow-up Telephone Call  Date of discharge and from where:  [facility] 7/25  Attempts:  1st Attempt  Reason for unsuccessful TCM follow-up call:  No answer/busy   Lenard Forth [facility] Guide, [facility] [phone] [address] [place], [place], [place] [address] Phone: [phone] Email: [email or web address]

## 2022-09-14 ENCOUNTER — Telehealth: Payer: Self-pay

## 2022-09-14 NOTE — Telephone Encounter (Signed)
Transition Care Management Unsuccessful Follow-up Telephone Call  Date of discharge and from where:  [facility] 7/25  Attempts:  2nd Attempt  Reason for unsuccessful TCM follow-up call:  No answer/busy   Lenard Forth [facility] Guide, [facility] [phone] [address] [place], [place], [place] [address] Phone: [phone] Email: [email or web address]

## 2022-10-02 DIAGNOSIS — E113311 Type 2 diabetes mellitus with moderate nonproliferative diabetic retinopathy with macular edema, right eye: Secondary | ICD-10-CM | POA: Diagnosis not present

## 2022-11-14 DIAGNOSIS — E113312 Type 2 diabetes mellitus with moderate nonproliferative diabetic retinopathy with macular edema, left eye: Secondary | ICD-10-CM | POA: Diagnosis not present

## 2022-11-23 DIAGNOSIS — H31093 Other chorioretinal scars, bilateral: Secondary | ICD-10-CM | POA: Diagnosis not present

## 2022-11-23 DIAGNOSIS — H3582 Retinal ischemia: Secondary | ICD-10-CM | POA: Diagnosis not present

## 2022-11-23 DIAGNOSIS — E113313 Type 2 diabetes mellitus with moderate nonproliferative diabetic retinopathy with macular edema, bilateral: Secondary | ICD-10-CM | POA: Diagnosis not present

## 2022-11-23 DIAGNOSIS — H25813 Combined forms of age-related cataract, bilateral: Secondary | ICD-10-CM | POA: Diagnosis not present

## 2022-12-04 DIAGNOSIS — E113311 Type 2 diabetes mellitus with moderate nonproliferative diabetic retinopathy with macular edema, right eye: Secondary | ICD-10-CM | POA: Diagnosis not present

## 2022-12-17 ENCOUNTER — Encounter: Payer: Self-pay | Admitting: Adult Health

## 2023-01-02 ENCOUNTER — Ambulatory Visit: Payer: Medicare PPO | Admitting: Adult Health

## 2023-01-29 DIAGNOSIS — E113312 Type 2 diabetes mellitus with moderate nonproliferative diabetic retinopathy with macular edema, left eye: Secondary | ICD-10-CM | POA: Diagnosis not present

## 2023-02-02 ENCOUNTER — Encounter: Payer: Self-pay | Admitting: Adult Health

## 2023-02-02 NOTE — Telephone Encounter (Signed)
 Care team updated and letter sent for eye exam notes.

## 2023-02-05 DIAGNOSIS — E113311 Type 2 diabetes mellitus with moderate nonproliferative diabetic retinopathy with macular edema, right eye: Secondary | ICD-10-CM | POA: Diagnosis not present

## 2023-02-05 LAB — HM DIABETES EYE EXAM

## 2023-03-01 ENCOUNTER — Encounter: Payer: Self-pay | Admitting: Adult Health

## 2023-03-01 ENCOUNTER — Telehealth: Payer: Medicare PPO | Admitting: Physician Assistant

## 2023-03-01 DIAGNOSIS — T148XXA Other injury of unspecified body region, initial encounter: Secondary | ICD-10-CM

## 2023-03-01 NOTE — Progress Notes (Signed)
Patient was sent a message to be seen for a face to face visit which requires additional evaluation and tetanus booster.

## 2023-03-02 ENCOUNTER — Encounter (HOSPITAL_BASED_OUTPATIENT_CLINIC_OR_DEPARTMENT_OTHER): Payer: Self-pay | Admitting: Emergency Medicine

## 2023-03-02 ENCOUNTER — Other Ambulatory Visit: Payer: Self-pay

## 2023-03-02 ENCOUNTER — Telehealth: Payer: Medicare PPO

## 2023-03-02 ENCOUNTER — Ambulatory Visit: Payer: Self-pay | Admitting: Adult Health

## 2023-03-02 ENCOUNTER — Emergency Department (HOSPITAL_BASED_OUTPATIENT_CLINIC_OR_DEPARTMENT_OTHER)
Admission: EM | Admit: 2023-03-02 | Discharge: 2023-03-02 | Disposition: A | Discharge status: Home / Self Care | Payer: Medicare PPO

## 2023-03-02 DIAGNOSIS — L819 Disorder of pigmentation, unspecified: Secondary | ICD-10-CM | POA: Insufficient documentation

## 2023-03-02 DIAGNOSIS — Z7984 Long term (current) use of oral hypoglycemic drugs: Secondary | ICD-10-CM | POA: Insufficient documentation

## 2023-03-02 DIAGNOSIS — Z79899 Other long term (current) drug therapy: Secondary | ICD-10-CM | POA: Insufficient documentation

## 2023-03-02 DIAGNOSIS — Z7982 Long term (current) use of aspirin: Secondary | ICD-10-CM | POA: Diagnosis not present

## 2023-03-02 DIAGNOSIS — Z794 Long term (current) use of insulin: Secondary | ICD-10-CM | POA: Insufficient documentation

## 2023-03-02 DIAGNOSIS — I10 Essential (primary) hypertension: Secondary | ICD-10-CM | POA: Diagnosis not present

## 2023-03-02 DIAGNOSIS — E119 Type 2 diabetes mellitus without complications: Secondary | ICD-10-CM | POA: Insufficient documentation

## 2023-03-02 NOTE — ED Provider Notes (Signed)
[place] EMERGENCY DEPARTMENT AT [facility] Provider Note   CSN: [id number] Arrival date & time: 03/02/23  6213     History  No chief complaint on file.   [name] is a 78 y.o. male.  78 year old male with past medical history of diabetes and hypertension presenting to the emergency department today with bleeding from his right leg.  The patient states that he noted a small lesion on his leg that he noticed around 2 weeks ago.  Reports this has gotten a little bit bigger and on Monday it started bleeding.  He states that it started bleeding more this morning so he came to the ER for further evaluation.  They did wrap this prior to coming in.  At home they have arrived this has stopped.  The patient denies any other symptoms or concerns for bleeding or bruising currently.        Home Medications Prior to Admission medications   Medication Sig Start Date End Date Taking? Authorizing Provider  Accu-Chek Softclix Lancets lancets Use as instructed 01/18/21   Shirline Frees, NP  aspirin EC 81 MG tablet Take 81 mg by mouth daily.    [provider]  Continuous Glucose Sensor (FREESTYLE LIBRE 3 SENSOR) MISC 1 Device by Does not apply route every 14 (fourteen) days. Place 1 sensor on the skin every 14 days. Use to check glucose continuously 06/21/22   Nafziger, Kandee Keen, NP  empagliflozin (JARDIANCE) 10 MG TABS tablet Take 1 tablet (10 mg total) by mouth daily before breakfast. 06/29/22   Nafziger, Kandee Keen, NP  Evolocumab (REPATHA SURECLICK) 140 MG/ML SOAJ Inject 1 pen. into the skin every 14 (fourteen) days. 05/03/21   [name], Deloris Ping, MD  glipiZIDE (GLUCOTROL XL) 10 MG 24 hr tablet Take 1 tablet (10 mg total) by mouth in the morning and at bedtime. 03/22/22   Nafziger, Kandee Keen, NP  hydrochlorothiazide (HYDRODIURIL) 25 MG tablet Take 1 tablet (25 mg total) by mouth daily. 03/22/22   Nafziger, Kandee Keen, NP  insulin glargine (LANTUS SOLOSTAR) 100 UNIT/ML Solostar Pen Inject 22 Units into  the skin 2 (two) times daily. 03/22/22 06/20/22  Nafziger, Kandee Keen, NP  Insulin Pen Needle (B-D ULTRAFINE III SHORT PEN) 31G X 8 MM MISC Use BID 03/22/22   Nafziger, Kandee Keen, NP  levofloxacin (LEVAQUIN) 500 MG tablet Take 1 tablet (500 mg total) by mouth daily. 09/07/22   Al Decant, PA-C  metoprolol tartrate (LOPRESSOR) 25 MG tablet TAKE 1 TABLET BY MOUTH TWICE A DAY 04/28/22   [name], Deloris Ping, MD  ondansetron (ZOFRAN) 4 MG tablet Take 1 tablet (4 mg total) by mouth every 6 (six) hours as needed for nausea or vomiting. 09/07/22   Al Decant, PA-C  tamsulosin (FLOMAX) 0.4 MG CAPS capsule Take 1 capsule (0.4 mg total) by mouth daily. 08/03/22   Riki Altes, MD      Allergies    Amiodarone and Quinolones    Review of Systems   Review of Systems  Skin:        Skin lesion  All other systems reviewed and are negative.   Physical Exam Updated Vital Signs BP (!) 189/97   Pulse 88   Temp 99 F (37.2 C) (Oral)   Resp 16   SpO2 99%  Physical Exam Vitals and nursing note reviewed.   Gen: NAD Eyes: PERRL, EOMI HEENT: no oropharyngeal swelling Neck: trachea midline Resp: clear to auscultation bilaterally Card: RRR, no murmurs, rubs, or gallops Abd: nontender, nondistended Extremities:  no calf tenderness, no edema Vascular: 2+ radial pulses bilaterally, 2+ DP pulses bilaterally Skin: There is a small raised lesion noted over the right anterior shin over the proximal third of the shin with no active bleeding Psyc: acting appropriately   ED Results / Procedures / Treatments   Labs (all labs ordered are listed, but only abnormal results are displayed) Labs Reviewed - No data to display  EKG None  Radiology No results found.  Procedures Procedures    Medications Ordered in ED Medications - No data to display  ED Course/ Medical Decision Making/ A&P                                 Medical Decision Making 78 year old male with past medical history of diabetes  and hypertension presenting to the emergency department today with bleeding from a skin lesion on his right leg.  This is hemostatic currently.  The patient is stable for discharge.  He will be given dermatology follow-up to exclude malignancy and is discharged with return precautions.           Final Clinical Impression(s) / ED Diagnoses Final diagnoses:  Bleeding pigmented skin lesion    Rx / DC Orders ED Discharge Orders     None         Durwin Glaze, MD 03/02/23 [phone]

## 2023-03-02 NOTE — ED Notes (Signed)
Discharge instructions and follow up care with dermatology reviewed and explained, pt verbalized understanding and had no further questions on d/c. No active bleeding and leg wrapped with gauze and coban. Pt caox4, ambulatory, NAD on d/c.

## 2023-03-02 NOTE — Telephone Encounter (Signed)
  Chief Complaint: bleeding from vein on R leg Symptoms: bleeding Frequency: 5 days Pertinent Negatives: Patient denies injury, blood thinner Disposition: [x] ED /[] Urgent Care (no appt availability in office) / [] Appointment(In office/virtual)/ []  [facility]/ [] Home Care/ [] Refused Recommended Disposition /[] [facility]/ []  Follow-up with PCP Additional Notes: Patient's daughter, Arlyss Repress on [place], called reporting patient has had bleeding from vein in R leg for 5 days. States when he is lying down it subsides but once he stands, it begins oozing. Reports she has kept gauze on it but it does bleed through to his pants with ambulation. Not actively bleeding at this time. Denies blood thinner. Per protocol, patient to be evaluated in ED. Caller states she is taking him to ED now. Care advice reviewed with patient, understanding verbalized. Denies further questions at this time. Alerting PCP for review.    Copied from CRM [id number]. Topic: Clinical - Red Word Triage >> Mar 02, 2023  8:04 AM Truddie Crumble wrote: Red Word that prompted transfer to Nurse Triage: vein in his right leg is protruding out and is bleeding Reason for Disposition  [1] Bleeding AND [2] won't stop after 10 minutes of direct pressure (using correct technique)  Answer Assessment - Initial Assessment Questions 1. APPEARANCE of INJURY: "What does the injury look like?"      Looks like a vein is sticking out on R leg, right below the knee, reports when he stands it oozes 2. SIZE: "How large is the cut?"      It is the vein- states it looks like a worm was coming out of his leg monday 3. BLEEDING: "Is it bleeding now?" If Yes, ask: "Is it difficult to stop?"      Yes, with ambulation. Unsure of how much. 4. LOCATION: "Where is the injury located?"      R leg near knee 5. ONSET: "How long ago did the injury occur?"      Monday- no injury he is aware 6. MECHANISM: "Tell me how it happened."      Unsure 7. TETANUS:  "When was the last tetanus booster?"     2010  Protocols used: Skin Injury-A-AH

## 2023-03-02 NOTE — Telephone Encounter (Signed)
Looks like pt tried to do a vv for the issue. Provider advised the pt to do a face-to-face.

## 2023-03-02 NOTE — Telephone Encounter (Signed)
Left message to return phone call.

## 2023-03-02 NOTE — Discharge Instructions (Signed)
Please call and schedule a follow-up appointment with the dermatologist at the number provided.  If the lesion bleeds again please wrap it like you did today.  If it continues to bleed please return to the emergency department for reevaluation.

## 2023-03-02 NOTE — Telephone Encounter (Signed)
Spoke to [name] and he stated that he went to [name] ED. [name] stated that they advised the [name] daughter to change the bandage on his leg frequently to keep the wound from bleeding again. Please advise

## 2023-03-02 NOTE — ED Triage Notes (Signed)
Pt caox4, stating approx 2 days ago he noticed just below his knee it looked like a "worm popped out" and then it burst and bled while in the shower, however was able to get under control. Pt states it started bleeding again this morning and it continued to go through gauze. No active bleeding on arrival to ED. Same leg (R) was used for cardiac surgery in 2017, denies issues since. Does not take blood thinner med.

## 2023-03-05 ENCOUNTER — Telehealth: Payer: Self-pay | Admitting: Pharmacy Technician

## 2023-03-05 ENCOUNTER — Other Ambulatory Visit (HOSPITAL_COMMUNITY): Payer: Self-pay

## 2023-03-05 NOTE — Telephone Encounter (Signed)
Pharmacy Patient Advocate Encounter   Received notification from CoverMyMeds that prior authorization for FreeStyle Libre 3 Sensor is required/requested.   Insurance verification completed.   The patient is insured through [employer] .   Per test claim: The current 84 day co-pay is, $0.00.  No PA needed at this time. This test claim was processed through [facility]- copay amounts may vary at other pharmacies due to pharmacy/plan contracts, or as the patient moves through the different stages of their insurance plan.

## 2023-04-09 DIAGNOSIS — E113312 Type 2 diabetes mellitus with moderate nonproliferative diabetic retinopathy with macular edema, left eye: Secondary | ICD-10-CM | POA: Diagnosis not present

## 2023-04-18 ENCOUNTER — Ambulatory Visit: Payer: Medicare PPO | Admitting: Adult Health

## 2023-04-18 ENCOUNTER — Ambulatory Visit

## 2023-04-18 VITALS — BP 160/100 | HR 95 | Temp 98.1°F | Ht 70.0 in | Wt 185.2 lb

## 2023-04-18 DIAGNOSIS — Z7984 Long term (current) use of oral hypoglycemic drugs: Secondary | ICD-10-CM

## 2023-04-18 DIAGNOSIS — I25810 Atherosclerosis of coronary artery bypass graft(s) without angina pectoris: Secondary | ICD-10-CM | POA: Diagnosis not present

## 2023-04-18 DIAGNOSIS — E1169 Type 2 diabetes mellitus with other specified complication: Secondary | ICD-10-CM | POA: Diagnosis not present

## 2023-04-18 DIAGNOSIS — G720 Drug-induced myopathy: Secondary | ICD-10-CM

## 2023-04-18 DIAGNOSIS — M25561 Pain in right knee: Secondary | ICD-10-CM | POA: Diagnosis not present

## 2023-04-18 DIAGNOSIS — E119 Type 2 diabetes mellitus without complications: Secondary | ICD-10-CM

## 2023-04-18 DIAGNOSIS — I10 Essential (primary) hypertension: Secondary | ICD-10-CM

## 2023-04-18 DIAGNOSIS — L989 Disorder of the skin and subcutaneous tissue, unspecified: Secondary | ICD-10-CM

## 2023-04-18 DIAGNOSIS — N401 Enlarged prostate with lower urinary tract symptoms: Secondary | ICD-10-CM | POA: Diagnosis not present

## 2023-04-18 DIAGNOSIS — Z794 Long term (current) use of insulin: Secondary | ICD-10-CM | POA: Diagnosis not present

## 2023-04-18 DIAGNOSIS — M1711 Unilateral primary osteoarthritis, right knee: Secondary | ICD-10-CM | POA: Diagnosis not present

## 2023-04-18 DIAGNOSIS — R351 Nocturia: Secondary | ICD-10-CM

## 2023-04-18 DIAGNOSIS — E782 Mixed hyperlipidemia: Secondary | ICD-10-CM | POA: Diagnosis not present

## 2023-04-18 DIAGNOSIS — E113311 Type 2 diabetes mellitus with moderate nonproliferative diabetic retinopathy with macular edema, right eye: Secondary | ICD-10-CM | POA: Diagnosis not present

## 2023-04-18 DIAGNOSIS — M25461 Effusion, right knee: Secondary | ICD-10-CM | POA: Diagnosis not present

## 2023-04-18 DIAGNOSIS — T466X5A Adverse effect of antihyperlipidemic and antiarteriosclerotic drugs, initial encounter: Secondary | ICD-10-CM

## 2023-04-18 LAB — POCT GLYCOSYLATED HEMOGLOBIN (HGB A1C): Hemoglobin A1C: 10.3 % — AB (ref 4.0–5.6)

## 2023-04-18 MED ORDER — METOPROLOL TARTRATE 25 MG PO TABS
25.0000 mg | ORAL_TABLET | Freq: Two times a day (BID) | ORAL | 0 refills | Status: DC
Start: 2023-04-18 — End: 2023-08-02

## 2023-04-18 MED ORDER — BD PEN NEEDLE SHORT U/F 31G X 8 MM MISC
3 refills | Status: AC
Start: 2023-04-18 — End: ?

## 2023-04-18 MED ORDER — TAMSULOSIN HCL 0.4 MG PO CAPS
0.4000 mg | ORAL_CAPSULE | Freq: Every day | ORAL | 3 refills | Status: AC
Start: 2023-04-18 — End: ?

## 2023-04-18 MED ORDER — EMPAGLIFLOZIN 10 MG PO TABS
10.0000 mg | ORAL_TABLET | Freq: Every day | ORAL | 1 refills | Status: AC
Start: 2023-04-18 — End: ?

## 2023-04-18 MED ORDER — HYDROCHLOROTHIAZIDE 25 MG PO TABS
25.0000 mg | ORAL_TABLET | Freq: Every day | ORAL | 3 refills | Status: AC
Start: 2023-04-18 — End: ?

## 2023-04-18 MED ORDER — REPATHA SURECLICK 140 MG/ML ~~LOC~~ SOAJ: 140.0000 mg | SUBCUTANEOUS | 2 refills | Status: DC

## 2023-04-18 MED ORDER — FREESTYLE LIBRE 3 PLUS SENSOR MISC: 6 refills | Status: DC

## 2023-04-18 MED ORDER — GLIPIZIDE ER 10 MG PO TB24
10.0000 mg | ORAL_TABLET | Freq: Two times a day (BID) | ORAL | 1 refills | Status: AC
Start: 2023-04-18 — End: ?

## 2023-04-18 MED ORDER — LANTUS SOLOSTAR 100 UNIT/ML ~~LOC~~ SOPN
22.0000 [IU] | PEN_INJECTOR | Freq: Two times a day (BID) | SUBCUTANEOUS | 1 refills | Status: DC
Start: 2023-04-18 — End: 2023-08-02

## 2023-04-18 NOTE — Progress Notes (Signed)
 Subjective:    Patient ID: [name], male    DOB: 1945/05/15, 78 y.o.   MRN: [id number]  HPI 78 year old male who  has a past medical history of Aortic stenosis, Blood transfusion without reported diagnosis, Carpal tunnel syndrome (09/24/2014), Coronary artery disease, COVID-19, Diabetes mellitus, type 2 (HCC), Erectile dysfunction, Essential hypertension, Hyperlipidemia, and Palpitations.  He presents to the office today for follow up regarding DM/HTN and   He was last seen 10 months ago. He reports that he has not been taking any of his medications since that time. He stopped taking them because " my pharmacy never had it in"   Diabetes mellitus type II-currently managed with glipizide 10 mg twice daily,Lantus 22 units subq twice daily and Jardiance 10 mg daily - he has not been taking any of his medications.  Lab Results  Component Value Date   HGBA1C 7.4 (H) 06/21/2022   HGBA1C 6.6 (A) 03/22/2022   HGBA1C 5.7 (A) 11/29/2021   Hypertension -managed with HCTZ 25 mg daily and Lopressor 25 mg daily.  He denies dizziness, lightheadedness, chest pain, or shortness of breath. He has not been taking any of his medications. Denies headaches or blurred vision.  BP Readings from Last 3 Encounters:  04/18/23 (!) 160/100  03/02/23 (!) 189/97  09/07/22 (!) 148/73   Additionally, he reports right knee pain and swelling for the last week. Knee is painful with walking and resting. He reports swelling periodically. Has used Aleve without relief. He denies trauma or aggravating injury. Pain and swelling has been present for a couple of weeks.  He was seen at [facility] for a lesion that would not stop bleeding and was referred to dermatology but he never heard from dermatology. He is wondering if this lesions is causing his pain and swelling.   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   Blood  transfusion without reported diagnosis    Carpal tunnel syndrome 09/24/2014   Bilateral   Coronary artery disease    Multivessel status post CABG September 2017   COVID-19    Diabetes mellitus, type 2 (HCC)    Erectile dysfunction    Essential hypertension    Hyperlipidemia    Palpitations    Cardiac monitor 03/2019: normal sinus rhythm, no AF, rare NSVT, VT    Social History   Socioeconomic History   Marital status: Widowed    Spouse name: Not on file   Number of children: 5   Years of education: 10    Highest education level: 10th grade  Occupational History   Occupation: custodian    Comment: PT  Tobacco Use   Smoking status: Never   Smokeless tobacco: Never  Vaping Use   Vaping status: Never Used  Substance and Sexual Activity   Alcohol use: Yes    Comment: 1 x month   Drug use: No   Sexual activity: Yes  Other Topics Concern   Not on file  Social History Narrative   Widowed to [name] for 44 years; 2 daughters liver with him now   Works part time as Metallurgist   5 children; 1 son deceased   Never Smoked   Alcohol use- no   Caffeine use: occasional       Lost one child a few months ago    Lost his sister, then son and then nephew -  all within 2 weeks apart   Son had a stroke at 93    Was planning to get married last month in Feb.    Social Drivers of Health   Financial Resource Strain: Low Risk  (02/21/2022)   Overall Financial Resource Strain (CARDIA)    Difficulty of Paying Living Expenses: Not hard at all  Food Insecurity: No Food Insecurity (02/21/2022)   Hunger Vital Sign    Worried About Running Out of Food in the Last Year: Never true    Ran Out of Food in the Last Year: Never true  Transportation Needs: No Transportation Needs (02/21/2022)   PRAPARE - Administrator, Civil Service (Medical): No    Lack of Transportation (Non-Medical): No  Physical Activity: Insufficiently Active (02/21/2022)   Exercise Vital Sign    Days  of Exercise per Week: 3 days    Minutes of Exercise per Session: 30 min  Stress: No Stress Concern Present (02/21/2022)   [employer] of Occupational Health - Occupational Stress Questionnaire    Feeling of Stress : Not at all  Social Connections: Moderately Integrated (02/21/2022)   Social Connection and Isolation Panel [NHANES]    Frequency of Communication with Friends and Family: More than three times a week    Frequency of Social Gatherings with Friends and Family: More than three times a week    Attends Religious Services: More than 4 times per year    Active Member of [employer] or Organizations: Yes    Attends Banker Meetings: More than 4 times per year    Marital Status: Widowed  Intimate Partner Violence: Not At Risk (02/21/2022)   Humiliation, Afraid, Rape, and Kick questionnaire    Fear of Current or Ex-Partner: No    Emotionally Abused: No    Physically Abused: No    Sexually Abused: No    Past Surgical History:  Procedure Laterality Date   AORTIC VALVE REPLACEMENT N/A 10/29/2015   Procedure: AORTIC VALVE REPLACEMENT (AVR) WITH MAGNA EASE TISSUE VALVE.;  Surgeon: Delight Ovens, MD;  Location: [facility] OR;  Service: Open Heart Surgery;  Laterality: N/A;   CARDIAC CATHETERIZATION N/A 10/27/2015   Procedure: Left Heart Cath and Coronary Angiography;  Surgeon: Marykay Lex, MD;  Location: [facility] INVASIVE CV LAB;  Service: Cardiovascular;  Laterality: N/A;   COLONOSCOPY  09/02/2015   Dr.Pyrtle   CORONARY ARTERY BYPASS GRAFT N/A 10/29/2015   Procedure: CORONARY ARTERY BYPASS GRAFTING (CABG) x Four UTILIZING THE LEFT INTERNAL MAMMARY ARTERY AND ENDOSCOPICALLY HARVESTED RIGHT SAPEHENEOUS VEINS.;  Surgeon: Delight Ovens, MD;  Location: [facility] OR;  Service: Open Heart Surgery;  Laterality: N/A;   CYST REMOVAL HAND Right    ESOPHAGOGASTRODUODENOSCOPY N/A 12/02/2015   Procedure: ESOPHAGOGASTRODUODENOSCOPY (EGD);  Surgeon: Ruffin Frederick, MD;  Location: [facility]  ENDOSCOPY;  Service: Gastroenterology;  Laterality: N/A;   REPLACEMENT ASCENDING AORTA N/A 10/29/2015   Procedure: REPLACEMENT OF ASCENDING AORTA USING X 30CM WOVEN DOUBLE VELOUR VASCULAR GRAFT.;  Surgeon: Delight Ovens, MD;  Location: [facility] OR;  Service: Open Heart Surgery;  Laterality: N/A;   TEE WITHOUT CARDIOVERSION N/A 10/29/2015   Procedure: TRANSESOPHAGEAL ECHOCARDIOGRAM (TEE);  Surgeon: Delight Ovens, MD;  Location: [facility] OR;  Service: Open Heart Surgery;  Laterality: N/A;   UVULECTOMY N/A 12/10/2015   Procedure: UVULECTOMY;  Surgeon: Flo Shanks, MD;  Location: [facility] OR;  Service: ENT;  Laterality: N/A;    Family History  Problem Relation Age of Onset  Cancer Father        Throat cancer   Diabetes Sister    Colon cancer Neg Hx     Allergies  Allergen Reactions   Amiodarone Nausea And Vomiting   Quinolones     Patient was warned about not using Cipro and similar antibiotics. Recent studies have raised concern that fluoroquinolone antibiotics could be associated with an increased risk of aortic aneurysm Fluoroquinolones have non-antimicrobial properties that might jeopardise the integrity of the extracellular matrix of the vascular wall In a  propensity score matched cohort study in [place], there was a 66% increased rate of aortic aneurysm or dissection associated with oral fluoroquinolone use, compared wit    Current Outpatient Medications on File Prior to Visit  Medication Sig Dispense Refill   Accu-Chek Softclix Lancets lancets Use as instructed (Patient not taking: Reported on 04/18/2023) 100 each 12   aspirin EC 81 MG tablet Take 81 mg by mouth daily. (Patient not taking: Reported on 04/18/2023)     No current facility-administered medications on file prior to visit.    BP (!) 160/100   Pulse 95   Temp 98.1 F (36.7 C) (Oral)   Ht 5\' 10"  (1.778 m)   Wt 185 lb 3.2 oz (84 kg)   SpO2 99%   BMI 26.57 kg/m       Objective:   Physical Exam Constitutional:       Appearance: Normal appearance.  Cardiovascular:     Rate and Rhythm: Normal rate and regular rhythm.     Pulses: Normal pulses.     Heart sounds: Normal heart sounds.  Pulmonary:     Effort: Pulmonary effort is normal.     Breath sounds: Normal breath sounds.  Musculoskeletal:     Right knee: Swelling, bony tenderness and crepitus present. No deformity. Normal range of motion. No tenderness.  Skin:    General: Skin is warm and dry.     Capillary Refill: Capillary refill takes less than 2 seconds.       Neurological:     General: No focal deficit present.     Mental Status: He is alert.  Psychiatric:        Mood and Affect: Mood normal.        Behavior: Behavior normal.        Thought Content: Thought content normal.        Judgment: Judgment normal.       Assessment & Plan:  1. Diabetes mellitus treated with oral medication (HCC) (Primary) - Will send in all of his medication to mail order. Educated on the importance of taking his medication and he can always let us know if he has any issues with getting his medication.  - POC HgB A1c - empagliflozin (JARDIANCE) 10 MG TABS tablet; Take 1 tablet (10 mg total) by mouth daily before breakfast.  Dispense: 90 tablet; Refill: 1 - glipiZIDE (GLUCOTROL XL) 10 MG 24 hr tablet; Take 1 tablet (10 mg total) by mouth in the morning and at bedtime.  Dispense: 180 tablet; Refill: 1 - Continuous Glucose Sensor (FREESTYLE LIBRE 3 PLUS SENSOR) MISC; Change sensor every 15 days.  Dispense: 2 each; Refill: 6  2. Type 2 diabetes mellitus with other specified complication, with long-term current use of insulin (HCC)  - POC HgB A1c- 10.3  - Follow up in 3 months  - empagliflozin (JARDIANCE) 10 MG TABS tablet; Take 1 tablet (10 mg total) by mouth daily before breakfast.  Dispense: 90 tablet; Refill:  1 - glipiZIDE (GLUCOTROL XL) 10 MG 24 hr tablet; Take 1 tablet (10 mg total) by mouth in the morning and at bedtime.  Dispense: 180 tablet; Refill: 1 -  insulin glargine (LANTUS SOLOSTAR) 100 UNIT/ML Solostar Pen; Inject 22 Units into the skin 2 (two) times daily.  Dispense: 40 mL; Refill: 1 - Insulin Pen Needle (B-D ULTRAFINE III SHORT PEN) 31G X 8 MM MISC; Use BID  Dispense: 200 each; Refill: 3 - Continuous Glucose Sensor (FREESTYLE LIBRE 3 PLUS SENSOR) MISC; Change sensor every 15 days.  Dispense: 2 each; Refill: 6  3. Essential hypertension - elevated today since he has not been taking his medication  - hydrochlorothiazide (HYDRODIURIL) 25 MG tablet; Take 1 tablet (25 mg total) by mouth daily.  Dispense: 90 tablet; Refill: 3 - metoprolol tartrate (LOPRESSOR) 25 MG tablet; Take 1 tablet (25 mg total) by mouth 2 (two) times daily.  Dispense: 30 tablet; Refill: 0  4. Benign prostatic hyperplasia with nocturia  - tamsulosin (FLOMAX) 0.4 MG CAPS capsule; Take 1 capsule (0.4 mg total) by mouth daily.  Dispense: 90 capsule; Refill: 3  5. Coronary artery disease involving coronary bypass graft of native heart without angina pectoris  - Evolocumab (REPATHA SURECLICK) 140 MG/ML SOAJ; Inject 140 mg into the skin every 14 (fourteen) days.  Dispense: 2 mL; Refill: 2  6. Mixed hyperlipidemia  - Evolocumab (REPATHA SURECLICK) 140 MG/ML SOAJ; Inject 140 mg into the skin every 14 (fourteen) days.  Dispense: 2 mL; Refill: 2  7. Acute pain of right knee - Likely arthritic with joint effusion. Will check xray today and decide on plan. Consider referral to orthopedics  - DG Knee 1-2 Views Right; Future  8. Statin myopathy  - Evolocumab (REPATHA SURECLICK) 140 MG/ML SOAJ; Inject 140 mg into the skin every 14 (fourteen) days.  Dispense: 2 mL; Refill: 2  9. Skin lesion - We did not have time to remove this today. He will come back in 2-3 weeks   Time spent with patient today was 44 minutes which consisted of chart review, discussing DM/Htn/Right knee pain/Skin neoplasm and medication compliance, work up, treatment answering questions and  documentation.

## 2023-04-18 NOTE — Patient Instructions (Signed)
 I have sent in all of your medications to mail order  I a going to do an xray of the right knee today   Follow up in 3 months for diabetic check   Follow up in 2-3 weeks for lesion removal and trigger finger injection

## 2023-04-20 ENCOUNTER — Other Ambulatory Visit: Payer: Self-pay | Admitting: Surgery

## 2023-04-20 DIAGNOSIS — I7121 Aneurysm of the ascending aorta, without rupture: Secondary | ICD-10-CM

## 2023-04-24 ENCOUNTER — Other Ambulatory Visit (HOSPITAL_COMMUNITY): Payer: Self-pay

## 2023-04-24 ENCOUNTER — Telehealth: Payer: Self-pay | Admitting: Pharmacy Technician

## 2023-04-24 NOTE — Telephone Encounter (Signed)
 Pharmacy Patient Advocate Encounter   Received notification from CoverMyMeds that prior authorization for Repatha SureClick 140MG /ML auto-injectors is required/requested.   Insurance verification completed.   The patient is insured through [place] .   Per test claim: PA required; PA submitted to above mentioned insurance via CoverMyMeds Key/confirmation #/EOC [id number] Status is pending

## 2023-04-24 NOTE — Telephone Encounter (Signed)
 Pharmacy Patient Advocate Encounter  Received notification from [facility] that Prior Authorization for Repatha SureClick 140MG /ML auto-injectors has been APPROVED from 02/13/2023 to 02/12/2024. Ran test claim, Copay is $40.00. This test claim was processed through [facility]- copay amounts may vary at other pharmacies due to pharmacy/plan contracts, or as the patient moves through the different stages of their insurance plan.   PA #/Case ID/Reference #: [id number]

## 2023-04-27 ENCOUNTER — Ambulatory Visit: Admitting: Adult Health

## 2023-04-27 ENCOUNTER — Other Ambulatory Visit: Payer: Self-pay | Admitting: Adult Health

## 2023-04-27 VITALS — BP 150/80 | HR 87 | Temp 98.3°F | Ht 70.0 in | Wt 179.0 lb

## 2023-04-27 DIAGNOSIS — L980 Pyogenic granuloma: Secondary | ICD-10-CM | POA: Diagnosis not present

## 2023-04-27 DIAGNOSIS — D492 Neoplasm of unspecified behavior of bone, soft tissue, and skin: Secondary | ICD-10-CM

## 2023-04-27 DIAGNOSIS — M65321 Trigger finger, right index finger: Secondary | ICD-10-CM

## 2023-04-27 MED ORDER — METHYLPREDNISOLONE ACETATE 40 MG/ML IJ SUSP
40.0000 mg | Freq: Once | INTRAMUSCULAR | Status: AC
Start: 2023-04-27 — End: 2023-04-27
  Administered 2023-04-27: 40 mg via INTRALESIONAL

## 2023-04-27 NOTE — Progress Notes (Addendum)
 Subjective:    Patient ID: [name], male    DOB: 15-May-1945, 78 y.o.   MRN: [id number]  HPI 78 year old male who  has a past medical history of Aortic stenosis, Blood transfusion without reported diagnosis, Carpal tunnel syndrome (09/24/2014), Coronary artery disease, COVID-19, Diabetes mellitus, type 2 (HCC), Erectile dysfunction, Essential hypertension, Hyperlipidemia, and Palpitations.  He presents to the office today for an acute visit.   His right pointer finger has started to lock up and he has to manually open it when it gets stuck. He has a hard time gripping and opening bottles.   He has a raised flesh colored lesion noted below his right knee that he is concerned may be skin cancer.    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   Blood transfusion without reported diagnosis    Carpal tunnel syndrome 09/24/2014   Bilateral   Coronary artery disease    Multivessel status post CABG September 2017   COVID-19    Diabetes mellitus, type 2 (HCC)    Erectile dysfunction    Essential hypertension    Hyperlipidemia    Palpitations    Cardiac monitor 03/2019: normal sinus rhythm, no AF, rare NSVT, VT    Social History   Socioeconomic History   Marital status: Widowed    Spouse name: Not on file   Number of children: 5   Years of education: 10    Highest education level: 10th grade  Occupational History   Occupation: custodian    Comment: PT  Tobacco Use   Smoking status: Never   Smokeless tobacco: Never  Vaping Use   Vaping status: Never Used  Substance and Sexual Activity   Alcohol use: Yes    Comment: 1 x month   Drug use: No   Sexual activity: Yes  Other Topics Concern   Not on file  Social History Narrative   Widowed to [employer] for 44 years; 2 daughters liver with him now   Works part time as Metallurgist   5 children; 1 son deceased   Never  Smoked   Alcohol use- no   Caffeine use: occasional       Lost one child a few months ago    Lost his sister, then son and then nephew - all within 2 weeks apart   Son had a stroke at 50    Was planning to get married last month in Feb.    Social Drivers of Health   Financial Resource Strain: Low Risk  (02/21/2022)   Overall Financial Resource Strain (CARDIA)    Difficulty of Paying Living Expenses: Not hard at all  Food Insecurity: No Food Insecurity (02/21/2022)   Hunger Vital Sign    Worried About Running Out of Food in the Last Year: Never true    Ran Out of Food in the Last Year: Never true  Transportation Needs: No Transportation Needs (02/21/2022)   PRAPARE - Administrator, Civil Service (Medical): No    Lack of Transportation (Non-Medical): No  Physical Activity: Insufficiently Active (02/21/2022)   Exercise Vital Sign    Days of Exercise per Week: 3 days    Minutes of Exercise per Session: 30 min  Stress: No Stress Concern Present (02/21/2022)   [employer] of Occupational Health - Occupational Stress Questionnaire    Feeling of Stress :  Not at all  Social Connections: Moderately Integrated (02/21/2022)   Social Connection and Isolation Panel [NHANES]    Frequency of Communication with Friends and Family: More than three times a week    Frequency of Social Gatherings with Friends and Family: More than three times a week    Attends Religious Services: More than 4 times per year    Active Member of [employer] or Organizations: Yes    Attends Banker Meetings: More than 4 times per year    Marital Status: Widowed  Intimate Partner Violence: Not At Risk (02/21/2022)   Humiliation, Afraid, Rape, and Kick questionnaire    Fear of Current or Ex-Partner: No    Emotionally Abused: No    Physically Abused: No    Sexually Abused: No    Past Surgical History:  Procedure Laterality Date   AORTIC VALVE REPLACEMENT N/A 10/29/2015   Procedure: AORTIC VALVE  REPLACEMENT (AVR) WITH MAGNA EASE TISSUE VALVE.;  Surgeon: Norita Beauvais, MD;  Location: [facility] OR;  Service: Open Heart Surgery;  Laterality: N/A;   CARDIAC CATHETERIZATION N/A 10/27/2015   Procedure: Left Heart Cath and Coronary Angiography;  Surgeon: Arleen Lacer, MD;  Location: [facility] INVASIVE CV LAB;  Service: Cardiovascular;  Laterality: N/A;   COLONOSCOPY  09/02/2015   Dr.Pyrtle   CORONARY ARTERY BYPASS GRAFT N/A 10/29/2015   Procedure: CORONARY ARTERY BYPASS GRAFTING (CABG) x Four UTILIZING THE LEFT INTERNAL MAMMARY ARTERY AND ENDOSCOPICALLY HARVESTED RIGHT SAPEHENEOUS VEINS.;  Surgeon: Norita Beauvais, MD;  Location: [facility] OR;  Service: Open Heart Surgery;  Laterality: N/A;   CYST REMOVAL HAND Right    ESOPHAGOGASTRODUODENOSCOPY N/A 12/02/2015   Procedure: ESOPHAGOGASTRODUODENOSCOPY (EGD);  Surgeon: Danette Duos, MD;  Location: [facility] ENDOSCOPY;  Service: Gastroenterology;  Laterality: N/A;   REPLACEMENT ASCENDING AORTA N/A 10/29/2015   Procedure: REPLACEMENT OF ASCENDING AORTA USING X 30CM WOVEN DOUBLE VELOUR VASCULAR GRAFT.;  Surgeon: Norita Beauvais, MD;  Location: [facility] OR;  Service: Open Heart Surgery;  Laterality: N/A;   TEE WITHOUT CARDIOVERSION N/A 10/29/2015   Procedure: TRANSESOPHAGEAL ECHOCARDIOGRAM (TEE);  Surgeon: Norita Beauvais, MD;  Location: [facility] OR;  Service: Open Heart Surgery;  Laterality: N/A;   UVULECTOMY N/A 12/10/2015   Procedure: UVULECTOMY;  Surgeon: Lenton Rail, MD;  Location: [facility] OR;  Service: ENT;  Laterality: N/A;    Family History  Problem Relation Age of Onset   Cancer Father        Throat cancer   Diabetes Sister    Colon cancer Neg Hx     Allergies  Allergen Reactions   Amiodarone  Nausea And Vomiting   Quinolones     Patient was warned about not using Cipro  and similar antibiotics. Recent studies have raised concern that fluoroquinolone antibiotics could be associated with an increased risk of aortic aneurysm Fluoroquinolones  have non-antimicrobial properties that might jeopardise the integrity of the extracellular matrix of the vascular wall In a  propensity score matched cohort study in [place], there was a 66% increased rate of aortic aneurysm or dissection associated with oral fluoroquinolone use, compared wit    Current Outpatient Medications on File Prior to Visit  Medication Sig Dispense Refill   Accu-Chek Softclix Lancets lancets Use as instructed 100 each 12   aspirin  EC 81 MG tablet Take 81 mg by mouth daily.     Continuous Glucose Sensor (FREESTYLE LIBRE 3 PLUS SENSOR) MISC Change sensor every 15 days. 2 each 6   empagliflozin  (JARDIANCE ) 10 MG  TABS tablet Take 1 tablet (10 mg total) by mouth daily before breakfast. 90 tablet 1   Evolocumab  (REPATHA  SURECLICK) 140 MG/ML SOAJ Inject 140 mg into the skin every 14 (fourteen) days. 2 mL 2   glipiZIDE  (GLUCOTROL  XL) 10 MG 24 hr tablet Take 1 tablet (10 mg total) by mouth in the morning and at bedtime. 180 tablet 1   hydrochlorothiazide  (HYDRODIURIL ) 25 MG tablet Take 1 tablet (25 mg total) by mouth daily. 90 tablet 3   insulin  glargine (LANTUS  SOLOSTAR) 100 UNIT/ML Solostar Pen Inject 22 Units into the skin 2 (two) times daily. 40 mL 1   Insulin  Pen Needle (B-D ULTRAFINE III SHORT PEN) 31G X 8 MM MISC Use BID 200 each 3   metoprolol  tartrate (LOPRESSOR ) 25 MG tablet Take 1 tablet (25 mg total) by mouth 2 (two) times daily. 30 tablet 0   tamsulosin  (FLOMAX ) 0.4 MG CAPS capsule Take 1 capsule (0.4 mg total) by mouth daily. 90 capsule 3   No current facility-administered medications on file prior to visit.    BP (!) 150/80   Pulse 87   Temp 98.3 F (36.8 C) (Oral)   Ht 5\' 10"  (1.778 m)   Wt 179 lb (81.2 kg)   SpO2 98%   BMI 25.68 kg/m       Objective:   Physical Exam Vitals and nursing note reviewed.  Constitutional:      Appearance: Normal appearance.  Cardiovascular:     Rate and Rhythm: Normal rate and regular rhythm.     Pulses: Normal  pulses.     Heart sounds: Normal heart sounds.  Musculoskeletal:        General: Normal range of motion.     Right hand: Tenderness present. Decreased strength of finger abduction. There is no disruption of two-point discrimination. Normal capillary refill.     Comments: Trigger finger noted to right pointer finger   Skin:    General: Skin is warm and dry.       Neurological:     General: No focal deficit present.     Mental Status: He is alert and oriented to person, place, and time.  Psychiatric:        Mood and Affect: Mood normal.        Behavior: Behavior normal.        Thought Content: Thought content normal.        Judgment: Judgment normal.       Assessment & Plan:  1. Skin neoplasm (Primary) Procedure including risks/benefits explained to patient.  Questions were answered. After informed consent was obtained and a time out completed, site was cleansed with betadine and then alcohol. 1% Lidocaine  with epinephrine  was injected under lesion and then shave biopsy was performed to obtain biopsy. Area was cauterized to obtain hemostasis.  Pt tolerated procedure well.  Specimen sent for pathology review.  Pt instructed to keep the area dry for 24 hours and to contact us  if he develops redness, drainage or swelling at the site.  Pt may use tylenol  as needed for discomfort today.   - Surgical pathology  2. Trigger index finger of right hand Verbal consent obtained. The patient was positioned comfortably, seated in a relaxed position with the hand resting on a flat surface.The right hand was thoroughly washed and scrubbed with betadine and alcohol to reduce the risk of infection. Topical anesthesia was applied over the area of the affected tendon sheath to minimize discomfort during injectio. After confirming the location  of the trigger point, the needle was carefully inserted into the [A1 pulley/affected tendon sheath] of the right pointer finger at the level of the metacarpophalangeal  joint.  Depo Medrol  40 mg/mL was slowly injected into the tendon sheath after confirming proper placement of the needle with aspiration technique to ensure no blood vessels were inadvertently punctured. The needle was withdrawn and a sterile bandage was applied to the injection site. Patient reported immediate improvement   Alto Atta, NP

## 2023-05-01 ENCOUNTER — Other Ambulatory Visit: Payer: Self-pay

## 2023-05-01 DIAGNOSIS — M1990 Unspecified osteoarthritis, unspecified site: Secondary | ICD-10-CM

## 2023-05-01 LAB — DERMATOLOGY PATHOLOGY

## 2023-05-03 ENCOUNTER — Ambulatory Visit: Admitting: Adult Health

## 2023-05-24 DIAGNOSIS — H35043 Retinal micro-aneurysms, unspecified, bilateral: Secondary | ICD-10-CM | POA: Diagnosis not present

## 2023-05-24 DIAGNOSIS — H59813 Chorioretinal scars after surgery for detachment, bilateral: Secondary | ICD-10-CM | POA: Diagnosis not present

## 2023-05-24 DIAGNOSIS — E113313 Type 2 diabetes mellitus with moderate nonproliferative diabetic retinopathy with macular edema, bilateral: Secondary | ICD-10-CM | POA: Diagnosis not present

## 2023-05-24 DIAGNOSIS — H2513 Age-related nuclear cataract, bilateral: Secondary | ICD-10-CM | POA: Diagnosis not present

## 2023-05-31 ENCOUNTER — Encounter: Payer: Self-pay | Admitting: Surgery

## 2023-06-04 ENCOUNTER — Inpatient Hospital Stay: Admission: RE | Admit: 2023-06-04 | Source: Ambulatory Visit

## 2023-06-05 NOTE — Patient Instructions (Signed)
 Health Maintenance Due  Topic Date Due   Zoster Vaccines- Shingrix (1 of 2) Never done   DTaP/Tdap/Td (2 - Tdap) 09/14/2018   OPHTHALMOLOGY EXAM  01/01/2020   FOOT EXAM  04/20/2022   COVID-19 Vaccine (2 - 2024-25 season) 10/15/2022   Medicare Annual Wellness (AWV)  02/22/2023   Diabetic kidney evaluation - Urine ACR  06/21/2023       04/18/2023    7:34 AM 02/21/2022    2:50 PM 09/23/2021    9:23 AM  Depression screen PHQ 2/9  Decreased Interest 0 0 0  Down, Depressed, Hopeless 0 0 0  PHQ - 2 Score 0 0 0

## 2023-06-12 ENCOUNTER — Encounter: Payer: Self-pay | Admitting: Surgery

## 2023-06-12 ENCOUNTER — Ambulatory Visit

## 2023-06-12 NOTE — Progress Notes (Deleted)
  HPI: [name] is a 78 year old gentleman past history of dyslipidemia, hypertension, atrial fibrillation, type 2 diabetes mellitus, coronary artery disease, aortic stenosis, and thoracic aortic aneurysm status post coronary bypass grafting x 4, aortic valve replacement, and repair of ascending aortic aneurysm by Dr. Nicanor Barge in 2017.  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  On his last CTA 1 year ago, the dilated proximal aortic arch was measuring 4.4 cm and was stable compared to previous studies.  Current Outpatient Medications  Medication Sig Dispense Refill   Accu-Chek Softclix Lancets lancets Use as instructed 100 each 12   aspirin  EC 81 MG tablet Take 81 mg by mouth daily.     Continuous Glucose Sensor (FREESTYLE LIBRE 3 PLUS SENSOR) MISC Change sensor every 15 days. 2 each 6   empagliflozin  (JARDIANCE ) 10 MG TABS tablet Take 1 tablet (10 mg total) by mouth daily before breakfast. 90 tablet 1   Evolocumab  (REPATHA  SURECLICK) 140 MG/ML SOAJ Inject 140 mg into the skin every 14 (fourteen) days. 2 mL 2   glipiZIDE  (GLUCOTROL  XL) 10 MG 24 hr tablet Take 1 tablet (10 mg total) by mouth in the morning and at bedtime. 180 tablet 1   hydrochlorothiazide  (HYDRODIURIL ) 25 MG tablet Take 1 tablet (25 mg total) by mouth daily. 90 tablet 3   insulin  glargine (LANTUS  SOLOSTAR) 100 UNIT/ML Solostar Pen Inject 22 Units into the skin 2 (two) times daily. 40 mL 1   Insulin  Pen Needle (B-D ULTRAFINE III SHORT PEN) 31G X 8 MM MISC Use BID 200 each 3   metoprolol  tartrate (LOPRESSOR ) 25 MG tablet Take 1 tablet (25 mg total) by mouth 2 (two) times daily. 30 tablet 0   tamsulosin  (FLOMAX ) 0.4 MG CAPS capsule Take 1 capsule (0.4 mg total) by mouth daily. 90 capsule 3   No current facility-administered medications for this visit.    Physical Exam: ***  Diagnostic  Tests: ***  Impression: ***  Plan: Annual surveillance is recommended.  We also reviewed the importance of careful attention blood pressure control and avoidance of strenuous activity. Follow-up in 1 year with CTA chest   Leata Providence, PA-C [facility] [phone]

## 2023-06-19 DIAGNOSIS — E113312 Type 2 diabetes mellitus with moderate nonproliferative diabetic retinopathy with macular edema, left eye: Secondary | ICD-10-CM | POA: Diagnosis not present

## 2023-06-27 DIAGNOSIS — E113311 Type 2 diabetes mellitus with moderate nonproliferative diabetic retinopathy with macular edema, right eye: Secondary | ICD-10-CM | POA: Diagnosis not present

## 2023-07-19 ENCOUNTER — Ambulatory Visit: Admitting: Adult Health

## 2023-07-19 NOTE — Progress Notes (Deleted)
 Subjective:    Patient ID: [name], male    DOB: January 03, 1946, 78 y.o.   MRN: [id number]  HPI 78 year old male who  has a past medical history of Aortic stenosis, Blood transfusion without reported diagnosis, Carpal tunnel syndrome (09/24/2014), Coronary artery disease, COVID-19, Diabetes mellitus, type 2 (HCC), Erectile dysfunction, Essential hypertension, Hyperlipidemia, and Palpitations.  He presents to the office today for follow up regarding DM/HTN and   Diabetes mellitus type II-currently managed with glipizide  10 mg twice daily,Lantus  22 units subq twice daily and Jardiance  10 mg daily. Three months ago his A1c increased to 10.3; he had not been taking any of his medications. He does not check his blood sugars at home.  Lab Results  Component Value Date   HGBA1C 10.3 (A) 04/18/2023   HGBA1C 7.4 (H) 06/21/2022   HGBA1C 6.6 (A) 03/22/2022    Hypertension -managed with HCTZ 25 mg daily and Lopressor  25 mg daily.  He denies dizziness, lightheadedness, chest pain, or shortness of breath. He has not been taking any of his medications.  BP Readings from Last 3 Encounters:  04/27/23 (!) 150/80  04/18/23 (!) 160/100  03/02/23 (!) 189/97     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   Blood transfusion without reported diagnosis    Carpal tunnel syndrome 09/24/2014   Bilateral   Coronary artery disease    Multivessel status post CABG September 2017   COVID-19    Diabetes mellitus, type 2 (HCC)    Erectile dysfunction    Essential hypertension    Hyperlipidemia    Palpitations    Cardiac monitor 03/2019: normal sinus rhythm, no AF, rare NSVT, VT    Social History   Socioeconomic History   Marital status: Widowed    Spouse name: Not on file   Number of children: 5   Years of education: 10    Highest education level: 10th grade  Occupational History   Occupation: custodian     Comment: PT  Tobacco Use   Smoking status: Never   Smokeless tobacco: Never  Vaping Use   Vaping status: Never Used  Substance and Sexual Activity   Alcohol use: Yes    Comment: 1 x month   Drug use: No   Sexual activity: Yes  Other Topics Concern   Not on file  Social History Narrative   Widowed to Sammi Crick for 44 years; 2 daughters liver with him now   Works part time as Metallurgist   5 children; 1 son deceased   Never Smoked   Alcohol use- no   Caffeine use: occasional       Lost one child a few months ago    Lost his sister, then son and then nephew - all within 2 weeks apart   Son had a stroke at 3    Was planning to get married last month in Feb.    Social Drivers of Health   Financial Resource Strain: Low Risk  (02/21/2022)   Overall Financial Resource Strain (CARDIA)    Difficulty of Paying Living Expenses: Not hard at all  Food Insecurity: No Food Insecurity (02/21/2022)   Hunger Vital Sign    Worried About Running Out of Food in the Last Year: Never true    Ran Out of Food in the Last Year: Never true  Transportation Needs: No Transportation Needs (  02/21/2022)   PRAPARE - Administrator, Civil Service (Medical): No    Lack of Transportation (Non-Medical): No  Physical Activity: Insufficiently Active (02/21/2022)   Exercise Vital Sign    Days of Exercise per Week: 3 days    Minutes of Exercise per Session: 30 min  Stress: No Stress Concern Present (02/21/2022)   [employer] of Occupational Health - Occupational Stress Questionnaire    Feeling of Stress : Not at all  Social Connections: Moderately Integrated (02/21/2022)   Social Connection and Isolation Panel [NHANES]    Frequency of Communication with Friends and Family: More than three times a week    Frequency of Social Gatherings with Friends and Family: More than three times a week    Attends Religious Services: More than 4 times per year    Active Member of [employer] or  Organizations: Yes    Attends Banker Meetings: More than 4 times per year    Marital Status: Widowed  Intimate Partner Violence: Not At Risk (02/21/2022)   Humiliation, Afraid, Rape, and Kick questionnaire    Fear of Current or Ex-Partner: No    Emotionally Abused: No    Physically Abused: No    Sexually Abused: No    Past Surgical History:  Procedure Laterality Date   AORTIC VALVE REPLACEMENT N/A 10/29/2015   Procedure: AORTIC VALVE REPLACEMENT (AVR) WITH MAGNA EASE TISSUE VALVE.;  Surgeon: Norita Beauvais, MD;  Location: [facility] OR;  Service: Open Heart Surgery;  Laterality: N/A;   CARDIAC CATHETERIZATION N/A 10/27/2015   Procedure: Left Heart Cath and Coronary Angiography;  Surgeon: Arleen Lacer, MD;  Location: [facility] INVASIVE CV LAB;  Service: Cardiovascular;  Laterality: N/A;   COLONOSCOPY  09/02/2015   Dr.Pyrtle   CORONARY ARTERY BYPASS GRAFT N/A 10/29/2015   Procedure: CORONARY ARTERY BYPASS GRAFTING (CABG) x Four UTILIZING THE LEFT INTERNAL MAMMARY ARTERY AND ENDOSCOPICALLY HARVESTED RIGHT SAPEHENEOUS VEINS.;  Surgeon: Norita Beauvais, MD;  Location: [facility] OR;  Service: Open Heart Surgery;  Laterality: N/A;   CYST REMOVAL HAND Right    ESOPHAGOGASTRODUODENOSCOPY N/A 12/02/2015   Procedure: ESOPHAGOGASTRODUODENOSCOPY (EGD);  Surgeon: Danette Duos, MD;  Location[facility] ENDOSCOPY;  Service: Gastroenterology;  Laterality: N/A;   REPLACEMENT ASCENDING AORTA N/A 10/29/2015   Procedure: REPLACEMENT OF ASCENDING AORTA USING X 30CM WOVEN DOUBLE VELOUR VASCULAR GRAFT.;  Surgeon: Norita Beauvais, MD;  Location: [facility] OR;  Service: Open Heart Surgery;  Laterality: N/A;   TEE WITHOUT CARDIOVERSION N/A 10/29/2015   Procedure: TRANSESOPHAGEAL ECHOCARDIOGRAM (TEE);  Surgeon: Norita Beauvais, MD;  Location: [facility] OR;  Service: Open Heart Surgery;  Laterality: N/A;   UVULECTOMY N/A 12/10/2015   Procedure: UVULECTOMY;  Surgeon: Lenton Rail, MD;  Location: [facility] OR;  Service:  ENT;  Laterality: N/A;    Family History  Problem Relation Age of Onset   Cancer Father        Throat cancer   Diabetes Sister    Colon cancer Neg Hx     Allergies  Allergen Reactions   Amiodarone  Nausea And Vomiting   Quinolones     Patient was warned about not using Cipro  and similar antibiotics. Recent studies have raised concern that fluoroquinolone antibiotics could be associated with an increased risk of aortic aneurysm Fluoroquinolones have non-antimicrobial properties that might jeopardise the integrity of the extracellular matrix of the vascular wall In a  propensity score matched cohort study in [place], there was a 66% increased rate of aortic  aneurysm or dissection associated with oral fluoroquinolone use, compared wit    Current Outpatient Medications on File Prior to Visit  Medication Sig Dispense Refill   Accu-Chek Softclix Lancets lancets Use as instructed 100 each 12   aspirin  EC 81 MG tablet Take 81 mg by mouth daily.     Continuous Glucose Sensor (FREESTYLE LIBRE 3 PLUS SENSOR) MISC Change sensor every 15 days. 2 each 6   empagliflozin  (JARDIANCE ) 10 MG TABS tablet Take 1 tablet (10 mg total) by mouth daily before breakfast. 90 tablet 1   glipiZIDE  (GLUCOTROL  XL) 10 MG 24 hr tablet Take 1 tablet (10 mg total) by mouth in the morning and at bedtime. 180 tablet 1   hydrochlorothiazide  (HYDRODIURIL ) 25 MG tablet Take 1 tablet (25 mg total) by mouth daily. 90 tablet 3   insulin  glargine (LANTUS  SOLOSTAR) 100 UNIT/ML Solostar Pen Inject 22 Units into the skin 2 (two) times daily. 40 mL 1   Insulin  Pen Needle (B-D ULTRAFINE III SHORT PEN) 31G X 8 MM MISC Use BID 200 each 3   metoprolol  tartrate (LOPRESSOR ) 25 MG tablet Take 1 tablet (25 mg total) by mouth 2 (two) times daily. 30 tablet 0   tamsulosin  (FLOMAX ) 0.4 MG CAPS capsule Take 1 capsule (0.4 mg total) by mouth daily. 90 capsule 3   No current facility-administered medications on file prior to visit.    There  were no vitals taken for this visit.      Objective:   Physical Exam Vitals and nursing note reviewed.  Constitutional:      Appearance: Normal appearance. He is obese.  Cardiovascular:     Rate and Rhythm: Normal rate and regular rhythm.     Pulses: Normal pulses.     Heart sounds: Normal heart sounds.  Pulmonary:     Effort: Pulmonary effort is normal.     Breath sounds: Normal breath sounds.  Skin:    General: Skin is warm and dry.  Neurological:     General: No focal deficit present.     Mental Status: He is alert and oriented to person, place, and time.  Psychiatric:        Mood and Affect: Mood normal.        Behavior: Behavior normal.        Thought Content: Thought content normal.        Judgment: Judgment normal.        Assessment & Plan:

## 2023-07-29 ENCOUNTER — Encounter: Payer: Self-pay | Admitting: Adult Health

## 2023-07-29 DIAGNOSIS — E119 Type 2 diabetes mellitus without complications: Secondary | ICD-10-CM

## 2023-07-29 DIAGNOSIS — E1169 Type 2 diabetes mellitus with other specified complication: Secondary | ICD-10-CM

## 2023-07-30 ENCOUNTER — Other Ambulatory Visit: Payer: Self-pay | Admitting: Adult Health

## 2023-07-30 DIAGNOSIS — G720 Drug-induced myopathy: Secondary | ICD-10-CM

## 2023-07-30 DIAGNOSIS — I25810 Atherosclerosis of coronary artery bypass graft(s) without angina pectoris: Secondary | ICD-10-CM

## 2023-07-30 DIAGNOSIS — E782 Mixed hyperlipidemia: Secondary | ICD-10-CM

## 2023-07-30 MED ORDER — EMPAGLIFLOZIN 10 MG PO TABS
10.0000 mg | ORAL_TABLET | Freq: Every day | ORAL | 1 refills | Status: AC
Start: 2023-07-30 — End: ?

## 2023-08-02 ENCOUNTER — Encounter: Payer: Self-pay | Admitting: Adult Health

## 2023-08-02 ENCOUNTER — Ambulatory Visit: Admitting: Adult Health

## 2023-08-02 VITALS — BP 138/86 | HR 81 | Temp 98.0°F | Ht 70.0 in | Wt 180.0 lb

## 2023-08-02 DIAGNOSIS — Z7984 Long term (current) use of oral hypoglycemic drugs: Secondary | ICD-10-CM | POA: Diagnosis not present

## 2023-08-02 DIAGNOSIS — I10 Essential (primary) hypertension: Secondary | ICD-10-CM

## 2023-08-02 DIAGNOSIS — E119 Type 2 diabetes mellitus without complications: Secondary | ICD-10-CM | POA: Diagnosis not present

## 2023-08-02 DIAGNOSIS — E1169 Type 2 diabetes mellitus with other specified complication: Secondary | ICD-10-CM | POA: Diagnosis not present

## 2023-08-02 DIAGNOSIS — Z794 Long term (current) use of insulin: Secondary | ICD-10-CM

## 2023-08-02 LAB — POCT GLYCOSYLATED HEMOGLOBIN (HGB A1C): Hemoglobin A1C: 7.5 % — AB (ref 4.0–5.6)

## 2023-08-02 MED ORDER — METOPROLOL TARTRATE 25 MG PO TABS: 25.0000 mg | ORAL_TABLET | Freq: Two times a day (BID) | ORAL | 3 refills | Status: DC

## 2023-08-02 MED ORDER — LANTUS SOLOSTAR 100 UNIT/ML ~~LOC~~ SOPN: 24.0000 [IU] | PEN_INJECTOR | Freq: Two times a day (BID) | SUBCUTANEOUS | 1 refills | Status: DC

## 2023-08-02 NOTE — Progress Notes (Signed)
 Subjective:    Patient ID: [name], male    DOB: August 19, 1945, 78 y.o.   MRN: [id number]  HPI He presents to the office today for follow up regarding DM/HTN   Diabetes mellitus type II-currently managed with glipizide  10 mg twice daily,Lantus  22 units subq twice daily and Jardiance  10 mg daily. Three months ago his A1c increased to 10.3; he had not been taking any of his medications. He does not check his blood sugars at home with the libre CGM. Today he reports that he is back to taking all of his medications.  Lab Results  Component Value Date   HGBA1C 7.5 (A) 08/02/2023   HGBA1C 10.3 (A) 04/18/2023   HGBA1C 7.4 (H) 06/21/2022   CONTINUOUS GLUCOSE MONITORING RECORD INTERPRETATION                 Dates of Recording: May 21st 2025 - June 9th 2025   Sensor description: freestyle libre    Results statistics:      Average  161  Time in range 70%  % Time Above 180 26%  % Time above 250 4%  % Time Below target 0%       Hypertension -managed with HCTZ 25 mg daily and Lopressor  25 mg daily.  He denies dizziness, lightheadedness, chest pain, or shortness of breath. He has been taking his medications   BP Readings from Last 3 Encounters:  08/02/23 138/86  04/27/23 (!) 150/80  04/18/23 (!) 160/100     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   Blood transfusion without reported diagnosis    Carpal tunnel syndrome 09/24/2014   Bilateral   Coronary artery disease    Multivessel status post CABG September 2017   COVID-19    Diabetes mellitus, type 2 (HCC)    Erectile dysfunction    Essential hypertension    Hyperlipidemia    Palpitations    Cardiac monitor 03/2019: normal sinus rhythm, no AF, rare NSVT, VT    Social History   Socioeconomic History   Marital status: Widowed    Spouse name: Not on file   Number of children: 5   Years of education: 10    Highest  education level: 10th grade  Occupational History   Occupation: custodian    Comment: PT  Tobacco Use   Smoking status: Never   Smokeless tobacco: Never  Vaping Use   Vaping status: Never Used  Substance and Sexual Activity   Alcohol use: Yes    Comment: 1 x month   Drug use: No   Sexual activity: Yes  Other Topics Concern   Not on file  Social History Narrative   Widowed to Sammi Crick for 44 years; 2 daughters liver with him now   Works part time as Metallurgist   5 children; 1 son deceased   Never Smoked   Alcohol use- no   Caffeine use: occasional       Lost one child a few months ago    Lost his sister, then son and then nephew - all within 2 weeks apart   Son had a stroke at 45    Was planning to get married last month in Feb.    Social Drivers of Health   Financial Resource Strain: Low Risk  (02/21/2022)   Overall Financial Resource Strain (CARDIA)    Difficulty of  Paying Living Expenses: Not hard at all  Food Insecurity: No Food Insecurity (02/21/2022)   Hunger Vital Sign    Worried About Running Out of Food in the Last Year: Never true    Ran Out of Food in the Last Year: Never true  Transportation Needs: No Transportation Needs (02/21/2022)   PRAPARE - Administrator, Civil Service (Medical): No    Lack of Transportation (Non-Medical): No  Physical Activity: Insufficiently Active (02/21/2022)   Exercise Vital Sign    Days of Exercise per Week: 3 days    Minutes of Exercise per Session: 30 min  Stress: No Stress Concern Present (02/21/2022)   [employer] of Occupational Health - Occupational Stress Questionnaire    Feeling of Stress : Not at all  Social Connections: Moderately Integrated (02/21/2022)   Social Connection and Isolation Panel    Frequency of Communication with Friends and Family: More than three times a week    Frequency of Social Gatherings with Friends and Family: More than three times a week    Attends Religious Services:  More than 4 times per year    Active Member of [employer] or Organizations: Yes    Attends Banker Meetings: More than 4 times per year    Marital Status: Widowed  Intimate Partner Violence: Not At Risk (02/21/2022)   Humiliation, Afraid, Rape, and Kick questionnaire    Fear of Current or Ex-Partner: No    Emotionally Abused: No    Physically Abused: No    Sexually Abused: No    Past Surgical History:  Procedure Laterality Date   AORTIC VALVE REPLACEMENT N/A 10/29/2015   Procedure: AORTIC VALVE REPLACEMENT (AVR) WITH MAGNA EASE TISSUE VALVE.;  Surgeon: Norita Beauvais, MD;  Location: [facility] OR;  Service: Open Heart Surgery;  Laterality: N/A;   CARDIAC CATHETERIZATION N/A 10/27/2015   Procedure: Left Heart Cath and Coronary Angiography;  Surgeon: Arleen Lacer, MD;  Location: [facility] INVASIVE CV LAB;  Service: Cardiovascular;  Laterality: N/A;   COLONOSCOPY  09/02/2015   Dr.Pyrtle   CORONARY ARTERY BYPASS GRAFT N/A 10/29/2015   Procedure: CORONARY ARTERY BYPASS GRAFTING (CABG) x Four UTILIZING THE LEFT INTERNAL MAMMARY ARTERY AND ENDOSCOPICALLY HARVESTED RIGHT SAPEHENEOUS VEINS.;  Surgeon: Norita Beauvais, MD;  Location: [facility] OR;  Service: Open Heart Surgery;  Laterality: N/A;   CYST REMOVAL HAND Right    ESOPHAGOGASTRODUODENOSCOPY N/A 12/02/2015   Procedure: ESOPHAGOGASTRODUODENOSCOPY (EGD);  Surgeon: Danette Duos, MD;  Location: [facility] ENDOSCOPY;  Service: Gastroenterology;  Laterality: N/A;   REPLACEMENT ASCENDING AORTA N/A 10/29/2015   Procedure: REPLACEMENT OF ASCENDING AORTA USING X 30CM WOVEN DOUBLE VELOUR VASCULAR GRAFT.;  Surgeon: Norita Beauvais, MD;  Location: [facility] OR;  Service: Open Heart Surgery;  Laterality: N/A;   TEE WITHOUT CARDIOVERSION N/A 10/29/2015   Procedure: TRANSESOPHAGEAL ECHOCARDIOGRAM (TEE);  Surgeon: Norita Beauvais, MD;  Location: [facility] OR;  Service: Open Heart Surgery;  Laterality: N/A;   UVULECTOMY N/A 12/10/2015   Procedure: UVULECTOMY;   Surgeon: Lenton Rail, MD;  Location: [facility] OR;  Service: ENT;  Laterality: N/A;    Family History  Problem Relation Age of Onset   Cancer Father        Throat cancer   Diabetes Sister    Colon cancer Neg Hx     Allergies  Allergen Reactions   Amiodarone  Nausea And Vomiting   Quinolones     Patient was warned about not using Cipro  and similar antibiotics. Recent  studies have raised concern that fluoroquinolone antibiotics could be associated with an increased risk of aortic aneurysm Fluoroquinolones have non-antimicrobial properties that might jeopardise the integrity of the extracellular matrix of the vascular wall In a  propensity score matched cohort study in [place], there was a 66% increased rate of aortic aneurysm or dissection associated with oral fluoroquinolone use, compared wit    Current Outpatient Medications on File Prior to Visit  Medication Sig Dispense Refill   Accu-Chek Softclix Lancets lancets Use as instructed 100 each 12   aspirin  EC 81 MG tablet Take 81 mg by mouth daily.     Continuous Glucose Sensor (FREESTYLE LIBRE 3 PLUS SENSOR) MISC Change sensor every 15 days. 2 each 6   empagliflozin  (JARDIANCE ) 10 MG TABS tablet Take 1 tablet (10 mg total) by mouth daily before breakfast. 90 tablet 1   glipiZIDE  (GLUCOTROL  XL) 10 MG 24 hr tablet Take 1 tablet (10 mg total) by mouth in the morning and at bedtime. 180 tablet 1   hydrochlorothiazide  (HYDRODIURIL ) 25 MG tablet Take 1 tablet (25 mg total) by mouth daily. 90 tablet 3   Insulin  Pen Needle (B-D ULTRAFINE III SHORT PEN) 31G X 8 MM MISC Use BID 200 each 3   REPATHA  SURECLICK 140 MG/ML SOAJ INJECT 140MG  UNDER THE SKIN EVERY 2 WEEKS 6 mL 3   tamsulosin  (FLOMAX ) 0.4 MG CAPS capsule Take 1 capsule (0.4 mg total) by mouth daily. 90 capsule 3   No current facility-administered medications on file prior to visit.    BP 138/86   Pulse 81   Temp 98 F (36.7 C) (Oral)   Ht 5' 10 (1.778 m)   Wt 180 lb (81.6 kg)   SpO2  97%   BMI 25.83 kg/m       Objective:   Physical Exam Vitals and nursing note reviewed.  Constitutional:      Appearance: Normal appearance.   Cardiovascular:     Rate and Rhythm: Normal rate and regular rhythm.     Pulses: Normal pulses.     Heart sounds: Normal heart sounds.  Pulmonary:     Effort: Pulmonary effort is normal.     Breath sounds: Normal breath sounds.   Skin:    General: Skin is warm and dry.   Neurological:     General: No focal deficit present.     Mental Status: He is alert and oriented to person, place, and time.   Psychiatric:        Mood and Affect: Mood normal.        Behavior: Behavior normal.        Thought Content: Thought content normal.        Judgment: Judgment normal.       Assessment & Plan:  1. Diabetes mellitus treated with oral medication (HCC) (Primary)  - POC HgB A1c- 7.5 - has improved. Continue with current medications   2. Type 2 diabetes mellitus with other specified complication, with long-term current use of insulin  (HCC) - Will increase his insulin  to 24 units BID - POC HgB A1c - insulin  glargine (LANTUS  SOLOSTAR) 100 UNIT/ML Solostar Pen; Inject 24 Units into the skin 2 (two) times daily.  Dispense: 45 mL; Refill: 1  3. Essential hypertension - At goal. No change in medications  - metoprolol  tartrate (LOPRESSOR ) 25 MG tablet; Take 1 tablet (25 mg total) by mouth 2 (two) times daily.  Dispense: 90 tablet; Refill: 3   Deara Bober, NP

## 2023-08-02 NOTE — Patient Instructions (Addendum)
 Your A1c has dropped to 7.5   Increase your insulin  to 24 units twice daily.

## 2023-08-28 ENCOUNTER — Telehealth: Payer: Self-pay | Admitting: Adult Health

## 2023-08-28 DIAGNOSIS — E113311 Type 2 diabetes mellitus with moderate nonproliferative diabetic retinopathy with macular edema, right eye: Secondary | ICD-10-CM | POA: Diagnosis not present

## 2023-08-28 DIAGNOSIS — H2511 Age-related nuclear cataract, right eye: Secondary | ICD-10-CM | POA: Diagnosis not present

## 2023-08-28 DIAGNOSIS — E113312 Type 2 diabetes mellitus with moderate nonproliferative diabetic retinopathy with macular edema, left eye: Secondary | ICD-10-CM | POA: Diagnosis not present

## 2023-08-28 DIAGNOSIS — H3401 Transient retinal artery occlusion, right eye: Secondary | ICD-10-CM | POA: Diagnosis not present

## 2023-08-28 DIAGNOSIS — H3582 Retinal ischemia: Secondary | ICD-10-CM | POA: Diagnosis not present

## 2023-08-28 NOTE — Telephone Encounter (Signed)
 Copied from CRM [id number]. Topic: General - Call Back - No Documentation >> Aug 28, 2023  3:52 PM Rea C wrote: Reason for CRM: A rep from Dr. Anthony office with [place], [phone] stated that they just faxed over documentation for the patients care and forgot to add in the note that patient would need an echo and carotid duplex ultrasound.

## 2023-08-28 NOTE — Telephone Encounter (Signed)
**Note De-identified  [place] Obfuscation** Please advise 

## 2023-08-29 NOTE — Telephone Encounter (Signed)
 No I have not seen PPW. Will call office tomorrow and advise.

## 2023-08-30 ENCOUNTER — Telehealth: Payer: Self-pay | Admitting: Adult Health

## 2023-08-30 DIAGNOSIS — H35041 Retinal micro-aneurysms, unspecified, right eye: Secondary | ICD-10-CM

## 2023-08-30 NOTE — Telephone Encounter (Signed)
 Patient notified of update  and verbalized understanding.

## 2023-08-30 NOTE — Telephone Encounter (Signed)
 Noted

## 2023-08-30 NOTE — Telephone Encounter (Signed)
 Received notification from patients eye doctor that   A rep from Dr. Anthony office with [place], [phone] stated that they just faxed over documentation for the patients care and forgot to add in the note that patient would need an echo and carotid duplex ultrasound.    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

## 2023-08-31 ENCOUNTER — Ambulatory Visit
Admission: RE | Admit: 2023-08-31 | Discharge: 2023-08-31 | Disposition: A | Discharge status: Home / Self Care | Source: Ambulatory Visit | Attending: Adult Health | Admitting: Adult Health

## 2023-08-31 DIAGNOSIS — H35041 Retinal micro-aneurysms, unspecified, right eye: Secondary | ICD-10-CM | POA: Diagnosis not present

## 2023-08-31 DIAGNOSIS — I6523 Occlusion and stenosis of bilateral carotid arteries: Secondary | ICD-10-CM | POA: Diagnosis not present

## 2023-09-04 ENCOUNTER — Ambulatory Visit: Payer: Self-pay | Admitting: Adult Health

## 2023-09-05 DIAGNOSIS — E113311 Type 2 diabetes mellitus with moderate nonproliferative diabetic retinopathy with macular edema, right eye: Secondary | ICD-10-CM | POA: Diagnosis not present

## 2023-10-02 ENCOUNTER — Other Ambulatory Visit: Payer: Self-pay | Admitting: Adult Health

## 2023-10-02 DIAGNOSIS — H35041 Retinal micro-aneurysms, unspecified, right eye: Secondary | ICD-10-CM

## 2023-10-02 DIAGNOSIS — I4892 Unspecified atrial flutter: Secondary | ICD-10-CM

## 2023-10-11 ENCOUNTER — Ambulatory Visit: Attending: Adult Health

## 2023-11-01 ENCOUNTER — Ambulatory Visit: Admitting: Family Medicine

## 2023-11-01 DIAGNOSIS — Z0000 Encounter for general adult medical examination without abnormal findings: Secondary | ICD-10-CM

## 2023-11-01 NOTE — Progress Notes (Signed)
 PATIENT CHECK-IN and HEALTH RISK ASSESSMENT QUESTIONNAIRE:  -completed by phone/video for upcoming Medicare Preventive Visit  Pre-Visit Check-in: 1)Vitals (height, wt, BP, etc) - record in vitals section for visit on day of visit Request home vitals (wt, BP, etc.) and enter into vitals, THEN update Vital Signs SmartPhrase below at the top of the HPI. See below.  2)Review and Update Medications, Allergies PMH, Surgeries, Social history in Epic 3)Hospitalizations in the last year with date/reason? n  4)Review and Update Care Team (patient's specialists) in Epic 5) Complete PHQ9 in Epic  6) Complete Fall Screening in Epic 7)Review all Health Maintenance Due and order if not done.  Medicare Wellness Patient Questionnaire:  Answer theses question about your habits: How often do you have a drink containing alcohol? 1-2 x per month How many drinks containing alcohol do you have on a typical day when you are drinking? 1 glass of wine How often do you have six or more drinks on one occasion?no Have you ever smoked?n Quit date if applicable? na  How many packs a day do/did you smoke? na Do you use smokeless tobacco?n Do you use an illicit drugs?n On average, how many days per week do you engage in moderate to strenuous exercise (like a brisk walk)?every day On average, how many minutes do you engage in exercise at this level? 30-40 minutes, walking, moves a lot at wrk Typical diet: chicken, fish, greens, beans, limits rice, not much pasta, some fruit daily  Beverages: water mostly, sometimes a pepsi, sometimes juice  Answer theses question about your everyday activities: Can you perform most household chores?y Are you deaf or have significant trouble hearing?n Do you feel that you have a problem with memory?n Do you feel safe at home? y Last dentist visit? Has dentures, goes every few years 8. Do you have any difficulty performing your everyday activities?n Are you having any difficulty  walking, taking medications on your own, and or difficulty managing daily home needs?n Do you have difficulty walking or climbing stairs?n Do you have difficulty dressing or bathing?n Do you have difficulty doing errands alone such as visiting a doctor's office or shopping?n Do you currently have any difficulty preparing food and eating?n Do you currently have any difficulty using the toilet?n Do you have any difficulty managing your finances?n Do you have any difficulties with housekeeping of managing your housekeeping?n   Do you have Advanced Directives in place (Living Will, Healthcare Power or Attorney)? Daughter is working on this   Last eye Exam and location? Has had exam recently, Dr. Patrcia and a retinal specialist - can't recall name at the moment   Do you currently use prescribed or non-prescribed narcotic or opioid pain medications?no  Do you have a history or close family history of breast, ovarian, tubal or peritoneal cancer or a family member with BRCA (breast cancer susceptibility 1 and 2) gene mutations? See pmh/fh   ----------------------------------------------------------------------------------------------------------------------------------------------------------------------------------------------------------------------  Because this visit was a virtual/telehealth visit, some criteria may be missing or patient reported. Any vitals not documented were not able to be obtained and vitals that have been documented are patient reported.    MEDICARE ANNUAL PREVENTIVE CARE VISIT WITH PROVIDER (Welcome to Medicare, initial annual wellness or annual wellness exam)  Virtual Visit via Phone Note  I connected with MURRAY DURRELL on 11/01/23  by phone and verified that I am speaking with the correct person using two identifiers.  Location patient: home Location provider:work or home office Persons participating in the  virtual visit: patient, provider  Concerns and/or  follow up today:no concerns   See HM section in Epic for other details of completed HM.    ROS: negative for report of fevers, unintentional weight loss, vision changes, vision loss, hearing loss or change, chest pain, sob, hemoptysis, melena, hematochezia, hematuria, falls, bleeding or bruising, thoughts of suicide or self harm, memory loss  Patient-completed extensive health risk assessment - reviewed and discussed with the patient: See Health Risk Assessment completed with patient prior to the visit either above or in recent phone note. This was reviewed in detailed with the patient today and appropriate recommendations, orders and referrals were placed as needed per Summary below and patient instructions.   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   Blood transfusion without reported diagnosis    Carpal tunnel syndrome 09/24/2014   Bilateral   Coronary artery disease    Multivessel status post CABG September 2017   COVID-19    Diabetes mellitus, type 2 [facility])    Erectile dysfunction    Essential hypertension    Hyperlipidemia    Palpitations    Cardiac monitor 03/2019: normal sinus rhythm, no AF, rare NSVT, VT    Past Surgical History:  Procedure Laterality Date   AORTIC VALVE REPLACEMENT N/A 10/29/2015   Procedure: AORTIC VALVE REPLACEMENT (AVR) WITH MAGNA EASE TISSUE VALVE.;  Surgeon: Dallas KATHEE Jude,  MD;  Location: [facility] OR;  Service: Open Heart Surgery;  Laterality: N/A;   CARDIAC CATHETERIZATION N/A 10/27/2015   Procedure: Left Heart Cath and Coronary Angiography;  Surgeon: Alm LELON Clay, MD;  Location[facility] INVASIVE CV LAB;  Service: Cardiovascular;  Laterality: N/A;   COLONOSCOPY  09/02/2015   Dr.Pyrtle   CORONARY ARTERY BYPASS GRAFT N/A 10/29/2015   Procedure: CORONARY ARTERY BYPASS GRAFTING (CABG) x Four UTILIZING THE LEFT INTERNAL MAMMARY ARTERY AND ENDOSCOPICALLY HARVESTED RIGHT SAPEHENEOUS VEINS.;  Surgeon: Dallas KATHEE Jude, MD;  Location: [facility] OR;  Service: Open Heart Surgery;  Laterality: N/A;   CYST REMOVAL HAND Right    ESOPHAGOGASTRODUODENOSCOPY N/A 12/02/2015   Procedure: ESOPHAGOGASTRODUODENOSCOPY (EGD);  Surgeon: Elspeth Deward Naval, MD;  Location: [facility] ENDOSCOPY;  Service: Gastroenterology;  Laterality: N/A;   REPLACEMENT ASCENDING AORTA N/A 10/29/2015   Procedure: REPLACEMENT OF ASCENDING AORTA USING X 30CM WOVEN DOUBLE VELOUR VASCULAR GRAFT.;  Surgeon: Dallas KATHEE Jude, MD;  Location: [facility] OR;  Service: Open Heart Surgery;  Laterality: N/A;   TEE WITHOUT CARDIOVERSION N/A 10/29/2015   Procedure: TRANSESOPHAGEAL ECHOCARDIOGRAM (TEE);  Surgeon: Dallas KATHEE Jude, MD;  Location: [facility] OR;  Service: Open Heart Surgery;  Laterality: N/A;   UVULECTOMY N/A 12/10/2015   Procedure: UVULECTOMY;  Surgeon: Marlyce Finer, MD;  Location: [facility] OR;  Service: ENT;  Laterality: N/A;    Social History   Socioeconomic History   Marital status: Widowed    Spouse name: Not on file   Number of children: 5   Years of education: 10    Highest education level: 10th  grade  Occupational History   Occupation: custodian    Comment: PT  Tobacco Use   Smoking status: Never   Smokeless tobacco: Never  Vaping Use   Vaping status: Never Used  Substance and Sexual Activity   Alcohol use: Yes    Comment: 1 x month   Drug use: No   Sexual activity: Yes  Other Topics Concern   Not on file   Social History Narrative   Widowed to Zebedee Gavel for 44 years; 2 daughters liver with him now   Works part time as Metallurgist   5 children; 1 son deceased   Never Smoked   Alcohol use- no   Caffeine use: occasional       Lost one child a few months ago    Lost his sister, then son and then nephew - all within 2 weeks apart   Son had a stroke at 80    Was planning to get married last month in Feb.    Social Drivers of Health   Financial Resource Strain: Low Risk  (02/21/2022)   Overall Financial Resource Strain (CARDIA)    Difficulty of Paying Living Expenses: Not hard at all  Food Insecurity: No Food Insecurity (02/21/2022)   Hunger Vital Sign    Worried About Running Out of Food in the Last Year: Never true    Ran Out of Food in the Last Year: Never true  Transportation Needs: No Transportation Needs (02/21/2022)   PRAPARE - Administrator, Civil Service (Medical): No    Lack of Transportation (Non-Medical): No  Physical Activity: Insufficiently Active (02/21/2022)   Exercise Vital Sign    Days of Exercise per Week: 3 days    Minutes of Exercise per Session: 30 min  Stress: No Stress Concern Present (02/21/2022)   [employer] of Occupational Health - Occupational Stress Questionnaire    Feeling of Stress : Not at all  Social Connections: Moderately Integrated (02/21/2022)   Social Connection and Isolation Panel    Frequency of Communication with Friends and Family: More than three times a week    Frequency of Social Gatherings with Friends and Family: More than three times a week    Attends Religious Services: More than 4 times per year    Active Member of [employer] or Organizations: Yes    Attends Banker Meetings: More than 4 times per year    Marital Status: Widowed  Intimate Partner Violence: Not At Risk (02/21/2022)   Humiliation, Afraid, Rape, and Kick questionnaire    Fear of Current or Ex-Partner: No    Emotionally Abused: No     Physically Abused: No    Sexually Abused: No    Family History  Problem Relation Age of Onset   Cancer Father        Throat cancer   Diabetes Sister    Colon cancer Neg Hx     Current Outpatient Medications on File Prior to Visit  Medication Sig Dispense Refill   Accu-Chek Softclix Lancets lancets Use as instructed 100 each 12   aspirin  EC 81 MG tablet Take 81 mg by mouth daily.     Continuous Glucose Sensor (FREESTYLE LIBRE 3 PLUS SENSOR) MISC Change sensor every 15 days. 2 each 6   empagliflozin  (JARDIANCE ) 10 MG TABS tablet Take 1 tablet (10 mg total) by mouth daily before breakfast. 90 tablet 1   glipiZIDE  (GLUCOTROL  XL) 10 MG 24 hr tablet Take 1 tablet (10 mg total)  by mouth in the morning and at bedtime. 180 tablet 1   hydrochlorothiazide  (HYDRODIURIL ) 25 MG tablet Take 1 tablet (25 mg total) by mouth daily. 90 tablet 3   insulin  glargine (LANTUS  SOLOSTAR) 100 UNIT/ML Solostar Pen Inject 24 Units into the skin 2 (two) times daily. 45 mL 1   Insulin  Pen Needle (B-D ULTRAFINE III SHORT PEN) 31G X 8 MM MISC Use BID 200 each 3   metoprolol  tartrate (LOPRESSOR ) 25 MG tablet Take 1 tablet (25 mg total) by mouth 2 (two) times daily. 90 tablet 3   REPATHA  SURECLICK 140 MG/ML SOAJ INJECT 140MG  UNDER THE SKIN EVERY 2 WEEKS 6 mL 3   tamsulosin  (FLOMAX ) 0.4 MG CAPS capsule Take 1 capsule (0.4 mg total) by mouth daily. 90 capsule 3   No current facility-administered medications on file prior to visit.    Allergies  Allergen Reactions   Amiodarone  Nausea And Vomiting   Quinolones     Patient was warned about not using Cipro  and similar antibiotics. Recent studies have raised concern that fluoroquinolone antibiotics could be associated with an increased risk of aortic aneurysm Fluoroquinolones have non-antimicrobial properties that might jeopardise the integrity of the extracellular matrix of the vascular wall In a  propensity score matched cohort study in [place], there was a 66% increased  rate of aortic aneurysm or dissection associated with oral fluoroquinolone use, compared wit       Physical Exam Vitals requested from patient and listed below if patient had equipment and was able to obtain at home for this virtual visit: There were no vitals filed for this visit. Estimated body mass index is 25.83 kg/m as calculated from the following:   Height as of 08/02/23: 5' 10 (1.778 m).   Weight as of 08/02/23: 180 lb (81.6 kg).  EKG (optional): deferred due to virtual visit  GENERAL: alert, oriented, no acute distress detected; full vision exam deferred due to pandemic and/or virtual encounter  PSYCH/NEURO: pleasant and cooperative, no obvious depression or anxiety, speech and thought processing grossly intact, Cognitive function grossly intact  Flowsheet Row Office Visit from 09/05/2021 in [facility] [place]  PHQ-9 Total Score 0        11/01/2023    1:07 PM 04/18/2023    7:34 AM 02/21/2022    2:50 PM 09/23/2021    9:23 AM 09/05/2021    2:24 PM  Depression screen PHQ 2/9  Decreased Interest 0 0 0 0 0  Down, Depressed, Hopeless 0 0 0 0 0  PHQ - 2 Score 0 0 0 0 0  Altered sleeping     0  Tired, decreased energy     0  Change in appetite     0  Feeling bad or failure about yourself      0  Trouble concentrating     0  Moving slowly or fidgety/restless     0  Suicidal thoughts     0  PHQ-9 Score     0  Difficult doing work/chores     Not difficult at all       09/23/2021    9:26 AM 10/10/2021    6:02 PM 02/21/2022    2:52 PM 04/18/2023    7:34 AM 11/01/2023    1:04 PM  Fall Risk  Falls in the past year? 0  0 0 0  Was there an injury with Fall? 0  0 0 0  Fall Risk Category Calculator 0  0 0 0  Fall Risk Category (Retired) Low   Low     (RETIRED) Patient Fall Risk Level Low fall risk  Low fall risk  Low fall risk     Patient at Risk for Falls Due to No Fall Risks  No Fall Risks    Fall risk Follow up   Falls prevention discussed  Falls  evaluation completed Falls evaluation completed     Data saved with a previous flowsheet row definition     SUMMARY AND PLAN:  Encounter for Medicare annual wellness exam  Discussed applicable health maintenance/preventive health measures and advised and referred or ordered per patient preferences: -discussed vaccines recs/risks, advised can get at the pharmacy and to bring proof of receipt once he does so that we can update record. -he see eye doctor and plans to request diabetic eye exam or report sent to [facility] if done -plans to get labs and foot exam at office visit with Darleene - already scheduled Health Maintenance  Topic Date Due   Zoster Vaccines- Shingrix (1 of 2) Never done   Diabetic kidney evaluation - Urine ACR  03/15/2010   DTaP/Tdap/Td (2 - Tdap) 09/14/2018   OPHTHALMOLOGY EXAM  01/01/2020   FOOT EXAM  04/20/2022   Diabetic kidney evaluation - eGFR measurement  09/07/2023   Influenza Vaccine  09/14/2023   COVID-19 Vaccine (2 - 2025-26 season) 10/15/2023   HEMOGLOBIN A1C  02/01/2024   Medicare Annual Wellness (AWV)  10/31/2024   Pneumococcal Vaccine: 50+ Years  Completed   Hepatitis C Screening  Completed   HPV VACCINES  Aged Out   Meningococcal B Vaccine  Aged Out   Colonoscopy  Discontinued     Education and counseling on the following was provided based on the above review of health and a plan/checklist for the patient, along with additional information discussed, was provided for the patient in the patient instructions :  -Advised on importance of completing advanced directives, discussed options for completing and provided information in patient instructions as well, reports daughter is working on this, further info provided in pt instructions -Advised and counseled on a healthy lifestyle  -Reviewed patient's current diet. Advised and counseled on a whole foods based healthy diet. A summary of a healthy diet was provided in the Patient Instructions.  -reviewed  patient's current physical activity level and discussed exercise guidelines for adults. Discussed community resources and ideas for safe exercise at home to assist in meeting exercise guideline recommendations in a safe and healthy way.  -Advise yearly dental visits at minimum and regular eye exams  Follow up: see patient instructions   Patient Instructions  I really enjoyed getting to talk with you today! I am available on Tuesdays and Thursdays for virtual visits if you have any questions or concerns, or if I can be of any further assistance.   CHECKLIST FROM ANNUAL WELLNESS VISIT:  -Follow up (please call to schedule if not scheduled after visit):   -yearly for annual wellness visit with primary care office  Here is a list of your preventive care/health maintenance measures and the plan for each if any are due:  PLAN For any measures below that may be due:    1. Can get vaccines at the pharmacy, please let us  know if you do so that we can update your record.   2. Can get foot exam and labs at your visit with  [facility]   3. Please check with your eye doctor about your Diabetic Eye exam. This should be  done yearly. Please send report to [facility]. Thanks!  Health Maintenance  Topic Date Due   Zoster Vaccines- Shingrix (1 of 2) Never done   Diabetic kidney evaluation - Urine ACR  03/15/2010   DTaP/Tdap/Td (2 - Tdap) 09/14/2018   OPHTHALMOLOGY EXAM  01/01/2020   FOOT EXAM  04/20/2022   Diabetic kidney evaluation - eGFR measurement  09/07/2023   Influenza Vaccine  09/14/2023   COVID-19 Vaccine (2 - 2025-26 season) 10/15/2023   HEMOGLOBIN A1C  02/01/2024   Medicare Annual Wellness (AWV)  10/31/2024   Pneumococcal Vaccine: 50+ Years  Completed   Hepatitis C Screening  Completed   HPV VACCINES  Aged Out   Meningococcal B Vaccine  Aged Out   Colonoscopy  Discontinued    -See a dentist at least yearly  -Get your eyes checked and then per your eye specialist's recommendations  -Other  issues addressed today:   -I have included below further information regarding a healthy whole foods based diet, physical activity guidelines for adults, stress management and opportunities for social connections. I hope you find this information useful.   -----------------------------------------------------------------------------------------------------------------------------------------------------------------------------------------------------------------------------------------------------------    NUTRITION: -eat real food: lots of colorful vegetables (half the plate) and fruits -5-7 servings of vegetables and fruits per day (fresh or steamed is best), exp. 2 servings of vegetables with lunch and dinner and 2 servings of fruit per day. Berries and greens such as kale and collards are great choices.  -consume on a regular basis:  fresh fruits, fresh veggies, fish, nuts, seeds, healthy oils (such as olive oil, avocado oil), whole grains (make sure for bread/pasta/crackers/etc., that the first ingredient on label contains the word whole), legumes. -can eat small amounts of dairy and lean meat (no larger than the palm of your hand), but avoid processed meats such as ham, bacon, lunch meat, etc. -drink water -try to avoid fast food and pre-packaged foods, processed meat, ultra processed foods/beverages (donuts, candy, etc.) -most experts advise limiting sodium to < 2300mg  per day, should limit further is any chronic conditions such as high blood pressure, heart disease, diabetes, etc. The American Heart Association advised that < 1500mg  is is ideal -try to avoid foods/beverages that contain any ingredients with names you do not recognize  -try to avoid foods/beverages  with added sugar or sweeteners/sweets  -try to avoid sweet drinks (including diet drinks): soda, juice, Gatorade, sweet tea, power drinks, diet drinks -try to avoid white rice, white bread, pasta (unless whole  grain)  EXERCISE GUIDELINES FOR ADULTS: -if you wish to increase your physical activity, do so gradually and with the approval of your doctor -STOP and seek medical care immediately if you have any chest pain, chest discomfort or trouble breathing when starting or increasing exercise  -move and stretch your body, legs, feet and arms when sitting for long periods -Physical activity guidelines for optimal health in adults: -get at least 150 minutes per week of moderate exercise (can talk, but not sing); this is about 20-30 minutes of sustained activity 5-7 days per week or two 10-15 minute episodes of sustained activity 5-7 days per week -do some muscle building/resistance training/strength training at least 2 days per week  -balance exercises 3+ days per week:   Stand somewhere where you have something sturdy to hold onto if you lose balance    1) lift up on toes, then back down, start with 5x per day and work up to 20x   2) stand and lift one leg straight out to the  side so that foot is a few inches of the floor, start with 5x each side and work up to 20x each side   3) stand on one foot, start with 5 seconds each side and work up to 20 seconds on each side  If you need ideas or help with getting more active:  -Silver  sneakers https://tools.silversneakers.com  -Walk with a Doc: http://www.duncan-williams.com/  -try to include resistance (weight lifting/strength building) and balance exercises twice per week: or the following link for ideas: http://castillo-powell.com/  BuyDucts.dk  STRESS MANAGEMENT: -can try meditating, or just sitting quietly with deep breathing while intentionally relaxing all parts of your body for 5 minutes daily -if you need further help with stress, anxiety or depression please follow up with your primary doctor or contact the wonderful folks at [facility]:  [phone]  SOCIAL CONNECTIONS: -options in [place] if you wish to engage in more social and exercise related activities:  -Silver  sneakers https://tools.silversneakers.com  -Walk with a Doc: http://www.duncan-williams.com/  -Check out the [facility] Active Adults 50+ section on the [place] of [employer] (hiking clubs, book clubs, cards and games, chess, exercise classes, aquatic classes and much more) - see the website for details: https://www.[place].gov/departments/parks-recreation/active-adults50  -YouTube has lots of exercise videos for different ages and abilities as well  -[facility] (a variety of indoor and outdoor inperson activities for adults). [phone]. [address].  -Virtual Online Classes (a variety of topics): see seniorplanet.org or call [phone]  -consider volunteering at a school, hospice center, church, senior center or elsewhere   ADVANCED HEALTHCARE DIRECTIVES:  [place] Advanced Directives assistance:   ExpressWeek.com.cy  Everyone should have advanced health care directives in place. This is so that you get the care you want, should you ever be in a situation where you are unable to make your own medical decisions.   From the [employer] Advanced Directive Website: Advance Health Care Directives are legal documents in which you give written instructions about your health care if, in the future, you cannot speak for yourself.   A health care power of attorney allows you to name a person you trust to make your health care decisions if you cannot make them yourself. A declaration of a desire for a natural death (or living will) is document, which states that you desire not to have your life prolonged by extraordinary measures if you have a terminal or incurable illness or if you are in a vegetative state. An advance instruction for mental health treatment makes a declaration of  instructions, information and preferences regarding your mental health treatment. It also states that you are aware that the advance instruction authorizes a mental health treatment provider to act according to your wishes. It may also outline your consent or refusal of mental health treatment. A declaration of an anatomical gift allows anyone over the age of 28 to make a gift by will, organ donor card or other document.   Please see the following website or an elder law attorney for forms, FAQs and for completion of advanced directives: [place]  Print production planner Health Care Directives Advance Health Care Directives (http://guzman.com/)  Or copy and paste the following to your web browser: [email or web address]           Chiquita JONELLE Cramp, DO

## 2023-11-01 NOTE — Patient Instructions (Addendum)
 I really enjoyed getting to talk with you today! I am available on Tuesdays and Thursdays for virtual visits if you have any questions or concerns, or if I can be of any further assistance.   CHECKLIST FROM ANNUAL WELLNESS VISIT:  -Follow up (please call to schedule if not scheduled after visit):   -yearly for annual wellness visit with primary care office  Here is a list of your preventive care/health maintenance measures and the plan for each if any are due:  PLAN For any measures below that may be due:    1. Can get vaccines at the pharmacy, please let us  know if you do so that we can update your record.   2. Can get foot exam and labs at your visit with  [facility].   3. Please check with your eye doctor about your Diabetic Eye exam. This should be done yearly. Please send report to [facility]. Thanks!  Health Maintenance  Topic Date Due   Zoster Vaccines- Shingrix (1 of 2) Never done   Diabetic kidney evaluation - Urine ACR  03/15/2010   DTaP/Tdap/Td (2 - Tdap) 09/14/2018   OPHTHALMOLOGY EXAM  01/01/2020   FOOT EXAM  04/20/2022   Diabetic kidney evaluation - eGFR measurement  09/07/2023   Influenza Vaccine  09/14/2023   COVID-19 Vaccine (2 - 2025-26 season) 10/15/2023   HEMOGLOBIN A1C  02/01/2024   Medicare Annual Wellness (AWV)  10/31/2024   Pneumococcal Vaccine: 50+ Years  Completed   Hepatitis C Screening  Completed   HPV VACCINES  Aged Out   Meningococcal B Vaccine  Aged Out   Colonoscopy  Discontinued    -See a dentist at least yearly  -Get your eyes checked and then per your eye specialist's recommendations  -Other issues addressed today:   -I have included below further information regarding a healthy whole foods based diet, physical activity guidelines for adults, stress management and opportunities for social connections. I hope you find this information useful.    -----------------------------------------------------------------------------------------------------------------------------------------------------------------------------------------------------------------------------------------------------------    NUTRITION: -eat real food: lots of colorful vegetables (half the plate) and fruits -5-7 servings of vegetables and fruits per day (fresh or steamed is best), exp. 2 servings of vegetables with lunch and dinner and 2 servings of fruit per day. Berries and greens such as kale and collards are great choices.  -consume on a regular basis:  fresh fruits, fresh veggies, fish, nuts, seeds, healthy oils (such as olive oil, avocado oil), whole grains (make sure for bread/pasta/crackers/etc., that the first ingredient on label contains the word whole), legumes. -can eat small amounts of dairy and lean meat (no larger than the palm of your hand), but avoid processed meats such as ham, bacon, lunch meat, etc. -drink water -try to avoid fast food and pre-packaged foods, processed meat, ultra processed foods/beverages (donuts, candy, etc.) -most experts advise limiting sodium to < 2300mg  per day, should limit further is any chronic conditions such as high blood pressure, heart disease, diabetes, etc. The American Heart Association advised that < 1500mg  is is ideal -try to avoid foods/beverages that contain any ingredients with names you do not recognize  -try to avoid foods/beverages  with added sugar or sweeteners/sweets  -try to avoid sweet drinks (including diet drinks): soda, juice, Gatorade, sweet tea, power drinks, diet drinks -try to avoid white rice, white bread, pasta (unless whole grain)  EXERCISE GUIDELINES FOR ADULTS: -if you wish to increase your physical activity, do so gradually and with the approval of your doctor -STOP and  seek medical care immediately if you have any chest pain, chest discomfort or trouble breathing when starting or  increasing exercise  -move and stretch your body, legs, feet and arms when sitting for long periods -Physical activity guidelines for optimal health in adults: -get at least 150 minutes per week of moderate exercise (can talk, but not sing); this is about 20-30 minutes of sustained activity 5-7 days per week or two 10-15 minute episodes of sustained activity 5-7 days per week -do some muscle building/resistance training/strength training at least 2 days per week  -balance exercises 3+ days per week:   Stand somewhere where you have something sturdy to hold onto if you lose balance    1) lift up on toes, then back down, start with 5x per day and work up to 20x   2) stand and lift one leg straight out to the side so that foot is a few inches of the floor, start with 5x each side and work up to 20x each side   3) stand on one foot, start with 5 seconds each side and work up to 20 seconds on each side  If you need ideas or help with getting more active:  -Silver  sneakers https://tools.silversneakers.com  -Walk with a Doc: http://www.duncan-williams.com/  -try to include resistance (weight lifting/strength building) and balance exercises twice per week: or the following link for ideas: http://castillo-powell.com/  BuyDucts.dk  STRESS MANAGEMENT: -can try meditating, or just sitting quietly with deep breathing while intentionally relaxing all parts of your body for 5 minutes daily -if you need further help with stress, anxiety or depression please follow up with your primary doctor or contact the wonderful folks at [facility]: [phone]  SOCIAL CONNECTIONS: -options in [place] if you wish to engage in more social and exercise related activities:  -Silver  sneakers https://tools.silversneakers.com  -Walk with a Doc: http://www.duncan-williams.com/  -Check out the [facility] Active Adults 50+  section on the [place] of [employer] (hiking clubs, book clubs, cards and games, chess, exercise classes, aquatic classes and much more) - see the website for details: https://www.[place].gov/departments/parks-recreation/active-adults50  -YouTube has lots of exercise videos for different ages and abilities as well  -[facility] (a variety of indoor and outdoor inperson activities for adults). [phone]. [address].  -Virtual Online Classes (a variety of topics): see seniorplanet.org or call [phone]  -consider volunteering at a school, hospice center, church, senior center or elsewhere   ADVANCED HEALTHCARE DIRECTIVES:  [place] Advanced Directives assistance:   ExpressWeek.com.cy  Everyone should have advanced health care directives in place. This is so that you get the care you want, should you ever be in a situation where you are unable to make your own medical decisions.   From the [place] Advanced Directive Website: Advance Health Care Directives are legal documents in which you give written instructions about your health care if, in the future, you cannot speak for yourself.   A health care power of attorney allows you to name a person you trust to make your health care decisions if you cannot make them yourself. A declaration of a desire for a natural death (or living will) is document, which states that you desire not to have your life prolonged by extraordinary measures if you have a terminal or incurable illness or if you are in a vegetative state. An advance instruction for mental health treatment makes a declaration of instructions, information and preferences regarding your mental health treatment. It also states that you are aware  that the advance instruction authorizes a mental health treatment provider to act according to your wishes. It may also outline your consent or refusal of mental  health treatment. A declaration of an anatomical gift allows anyone over the age of 73 to make a gift by will, organ donor card or other document.   Please see the following website or an elder law attorney for forms, FAQs and for completion of advanced directives: [place]  Print production planner Health Care Directives Advance Health Care Directives ([email or web address]/)  Or copy and paste the following to your web browser: [email or web address]

## 2023-11-06 DIAGNOSIS — E113312 Type 2 diabetes mellitus with moderate nonproliferative diabetic retinopathy with macular edema, left eye: Secondary | ICD-10-CM | POA: Diagnosis not present

## 2023-11-21 DIAGNOSIS — E113311 Type 2 diabetes mellitus with moderate nonproliferative diabetic retinopathy with macular edema, right eye: Secondary | ICD-10-CM | POA: Diagnosis not present

## 2023-11-29 ENCOUNTER — Encounter: Payer: Self-pay | Admitting: Adult Health

## 2023-12-28 DIAGNOSIS — E113312 Type 2 diabetes mellitus with moderate nonproliferative diabetic retinopathy with macular edema, left eye: Secondary | ICD-10-CM | POA: Diagnosis not present

## 2023-12-28 DIAGNOSIS — E113391 Type 2 diabetes mellitus with moderate nonproliferative diabetic retinopathy without macular edema, right eye: Secondary | ICD-10-CM | POA: Diagnosis not present

## 2023-12-28 DIAGNOSIS — H3582 Retinal ischemia: Secondary | ICD-10-CM | POA: Diagnosis not present

## 2023-12-28 DIAGNOSIS — H2513 Age-related nuclear cataract, bilateral: Secondary | ICD-10-CM | POA: Diagnosis not present

## 2024-01-10 ENCOUNTER — Other Ambulatory Visit: Payer: Self-pay | Admitting: Adult Health

## 2024-01-10 DIAGNOSIS — E119 Type 2 diabetes mellitus without complications: Secondary | ICD-10-CM

## 2024-01-10 DIAGNOSIS — E1169 Type 2 diabetes mellitus with other specified complication: Secondary | ICD-10-CM

## 2024-01-14 ENCOUNTER — Other Ambulatory Visit: Payer: Self-pay | Admitting: Adult Health

## 2024-01-14 DIAGNOSIS — I4892 Unspecified atrial flutter: Secondary | ICD-10-CM

## 2024-01-14 DIAGNOSIS — H35041 Retinal micro-aneurysms, unspecified, right eye: Secondary | ICD-10-CM

## 2024-01-18 ENCOUNTER — Ambulatory Visit: Attending: Adult Health

## 2024-01-18 DIAGNOSIS — I4892 Unspecified atrial flutter: Secondary | ICD-10-CM

## 2024-01-18 DIAGNOSIS — I4891 Unspecified atrial fibrillation: Secondary | ICD-10-CM

## 2024-01-18 LAB — ECHOCARDIOGRAM COMPLETE
AR max vel: 1.02 cm2
AV Area VTI: 1.21 cm2
AV Area mean vel: 1.08 cm2
AV Mean grad: 6 mmHg
AV Peak grad: 10.1 mmHg
Ao pk vel: 1.59 m/s
Area-P 1/2: 3.6 cm2
S' Lateral: 3.6 cm

## 2024-01-18 LAB — LAB REPORT - SCANNED
Albumin, Urine POC: 4.7
Creatinine, POC: 78 mg/dL
EGFR: 50
Microalb Creat Ratio: 60

## 2024-01-22 ENCOUNTER — Ambulatory Visit: Payer: Self-pay | Admitting: Adult Health

## 2024-02-01 ENCOUNTER — Ambulatory Visit: Payer: Self-pay | Admitting: Adult Health

## 2024-02-01 ENCOUNTER — Encounter: Payer: Self-pay | Admitting: Adult Health

## 2024-02-01 ENCOUNTER — Ambulatory Visit: Admitting: Adult Health

## 2024-02-01 VITALS — BP 130/80 | HR 67 | Temp 98.0°F | Ht 70.25 in | Wt 182.0 lb

## 2024-02-01 DIAGNOSIS — E1122 Type 2 diabetes mellitus with diabetic chronic kidney disease: Secondary | ICD-10-CM

## 2024-02-01 DIAGNOSIS — G8929 Other chronic pain: Secondary | ICD-10-CM

## 2024-02-01 DIAGNOSIS — I10 Essential (primary) hypertension: Secondary | ICD-10-CM | POA: Diagnosis not present

## 2024-02-01 DIAGNOSIS — E1169 Type 2 diabetes mellitus with other specified complication: Secondary | ICD-10-CM

## 2024-02-01 DIAGNOSIS — M25561 Pain in right knee: Secondary | ICD-10-CM | POA: Diagnosis not present

## 2024-02-01 DIAGNOSIS — T466X5A Adverse effect of antihyperlipidemic and antiarteriosclerotic drugs, initial encounter: Secondary | ICD-10-CM

## 2024-02-01 DIAGNOSIS — E119 Type 2 diabetes mellitus without complications: Secondary | ICD-10-CM

## 2024-02-01 DIAGNOSIS — E782 Mixed hyperlipidemia: Secondary | ICD-10-CM | POA: Diagnosis not present

## 2024-02-01 DIAGNOSIS — Z794 Long term (current) use of insulin: Secondary | ICD-10-CM

## 2024-02-01 DIAGNOSIS — N401 Enlarged prostate with lower urinary tract symptoms: Secondary | ICD-10-CM | POA: Diagnosis not present

## 2024-02-01 DIAGNOSIS — N1831 Chronic kidney disease, stage 3a: Secondary | ICD-10-CM

## 2024-02-01 DIAGNOSIS — G720 Drug-induced myopathy: Secondary | ICD-10-CM

## 2024-02-01 DIAGNOSIS — I25810 Atherosclerosis of coronary artery bypass graft(s) without angina pectoris: Secondary | ICD-10-CM

## 2024-02-01 DIAGNOSIS — Z0000 Encounter for general adult medical examination without abnormal findings: Secondary | ICD-10-CM | POA: Diagnosis not present

## 2024-02-01 DIAGNOSIS — R351 Nocturia: Secondary | ICD-10-CM | POA: Diagnosis not present

## 2024-02-01 DIAGNOSIS — Z23 Encounter for immunization: Secondary | ICD-10-CM | POA: Diagnosis not present

## 2024-02-01 LAB — LIPID PANEL
Cholesterol: 209 mg/dL — ABNORMAL HIGH (ref 28–200)
HDL: 99.1 mg/dL
LDL Cholesterol: 98 mg/dL (ref 10–99)
NonHDL: 109.47
Total CHOL/HDL Ratio: 2
Triglycerides: 59 mg/dL (ref 10.0–149.0)
VLDL: 11.8 mg/dL (ref 0.0–40.0)

## 2024-02-01 LAB — HEMOGLOBIN A1C: Hgb A1c MFr Bld: 8.1 % — ABNORMAL HIGH (ref 4.6–6.5)

## 2024-02-01 LAB — COMPREHENSIVE METABOLIC PANEL WITH GFR
ALT: 21 U/L (ref 3–53)
AST: 32 U/L (ref 5–37)
Albumin: 4.4 g/dL (ref 3.5–5.2)
Alkaline Phosphatase: 70 U/L (ref 39–117)
BUN: 22 mg/dL (ref 6–23)
CO2: 27 meq/L (ref 19–32)
Calcium: 9.3 mg/dL (ref 8.4–10.5)
Chloride: 101 meq/L (ref 96–112)
Creatinine, Ser: 1.24 mg/dL (ref 0.40–1.50)
GFR: 56.44 mL/min — ABNORMAL LOW
Glucose, Bld: 148 mg/dL — ABNORMAL HIGH (ref 70–99)
Potassium: 4.3 meq/L (ref 3.5–5.1)
Sodium: 137 meq/L (ref 135–145)
Total Bilirubin: 0.7 mg/dL (ref 0.2–1.2)
Total Protein: 7.4 g/dL (ref 6.0–8.3)

## 2024-02-01 LAB — PSA: PSA: 3.86 ng/mL (ref 0.10–4.00)

## 2024-02-01 LAB — MICROALBUMIN / CREATININE URINE RATIO
Creatinine,U: 147.9 mg/dL
Microalb Creat Ratio: 123.2 mg/g — ABNORMAL HIGH (ref 0.0–30.0)
Microalb, Ur: 18.2 mg/dL — ABNORMAL HIGH (ref 0.7–1.9)

## 2024-02-01 LAB — CBC
HCT: 39.1 % (ref 39.0–52.0)
Hemoglobin: 13.2 g/dL (ref 13.0–17.0)
MCHC: 33.8 g/dL (ref 30.0–36.0)
MCV: 91.8 fl (ref 78.0–100.0)
Platelets: 242 K/uL (ref 150.0–400.0)
RBC: 4.26 Mil/uL (ref 4.22–5.81)
RDW: 13.7 % (ref 11.5–15.5)
WBC: 7.6 K/uL (ref 4.0–10.5)

## 2024-02-01 LAB — TSH: TSH: 3.35 u[IU]/mL (ref 0.35–5.50)

## 2024-02-01 NOTE — Progress Notes (Signed)
 "  Subjective:    Patient ID: [name], male    DOB: 1946-01-05, 78 y.o.   MRN: [id number]  HPI Patient presents for yearly preventative medicine examination. He is a pleasant 78 year old male  who  has a past medical history of Aortic stenosis, Blood transfusion without reported diagnosis, Carpal tunnel syndrome (09/24/2014), Coronary artery disease, COVID-19, Diabetes mellitus, type 2 (HCC), Erectile dysfunction, Essential hypertension, Hyperlipidemia, and Palpitations.  Diabetes mellitus type II-currently managed with glipizide  10 mg twice daily,Lantus  24 units subq twice daily and Jardiance  10 mg daily.  He does not check his blood sugars at home with the libre CGM. Lab Results  Component Value Date   HGBA1C 7.5 (A) 08/02/2023   HGBA1C 10.3 (A) 04/18/2023   HGBA1C 7.4 (H) 06/21/2022    CONTINUOUS GLUCOSE MONITORING RECORD INTERPRETATION                 Dates of Recording: November 20th - Dec 19th 2025   Sensor description: freestyle libre    Results statistics:      Average  161  Time in range 65%  % Time Above 180 26%  % Time above 250 4%  % Time Below target 0%      Hypertension -managed with HCTZ 25 mg daily and Lopressor  25 mg daily.  He denies dizziness, lightheadedness, chest pain, or shortness of breath. He has been taking his medications   BP Readings from Last 3 Encounters:  02/01/24 130/80  08/02/23 138/86  04/27/23 (!) 150/80   Hyperlipidemia/CAD-managed by cardiology.  Currently prescribed Repatha  140 mg ever 14 days,  and aspirin  81 mg daily.  Lab Results  Component Value Date   CHOL 269 (H) 06/21/2022   HDL 84.80 06/21/2022   LDLCALC 171 (H) 06/21/2022   LDLDIRECT 112.6 05/14/2012   TRIG 66.0 06/21/2022   CHOLHDL 3 06/21/2022    BPH - managed by Urology with Flomax  0.4 mg daily  Chronic right knee pain - he reports that for  awhile he has noticed swelling and pain to his right knee. This has improved and he is not having much pain or swelling  but the pain and swelling has a tendency to come back and then go away. No currently having pain with walking or climbing stairs. He denies injury or trauma.   All immunizations and health maintenance protocols were reviewed with the patient and needed orders were placed.  Appropriate screening laboratory values were ordered for the patient including screening of hyperlipidemia, renal function and hepatic function. If indicated by BPH, a PSA was ordered.  Medication reconciliation,  past medical history, social history, problem list and allergies were reviewed in detail with the patient  Goals were established with regard to weight loss, exercise, and  diet in compliance with medications  Wt Readings from Last 3 Encounters:  02/01/24 182 lb (82.6 kg)  08/02/23 180 lb (81.6 kg)  04/27/23 179 lb (81.2 kg)     Review of Systems  Constitutional: Negative.   HENT: Negative.    Eyes: Negative.   Respiratory: Negative.    Cardiovascular: Negative.   Gastrointestinal: Negative.   Endocrine: Negative.   Genitourinary: Negative.   Musculoskeletal: Negative.   Skin: Negative.   Allergic/Immunologic: Negative.   Neurological: Negative.   Hematological: Negative.   Psychiatric/Behavioral: Negative.    All other systems reviewed and are negative.  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  Blood transfusion without reported diagnosis    Carpal tunnel syndrome 09/24/2014   Bilateral   Coronary artery disease    Multivessel status post CABG September 2017   COVID-19    Diabetes mellitus, type 2 (HCC)    Erectile dysfunction    Essential hypertension    Hyperlipidemia    Palpitations    Cardiac monitor 03/2019: normal sinus rhythm, no AF, rare NSVT, VT    Social History   Socioeconomic History   Marital status: Widowed    Spouse name: Not on file   Number of children: 5   Years of education: 10    Highest education  level: 10th grade  Occupational History   Occupation: custodian    Comment: PT  Tobacco Use   Smoking status: Never   Smokeless tobacco: Never  Vaping Use   Vaping status: Never Used  Substance and Sexual Activity   Alcohol use: Yes    Comment: 1 x month   Drug use: No   Sexual activity: Yes  Other Topics Concern   Not on file  Social History Narrative   Widowed to Zebedee Gavel for 44 years; 2 daughters liver with him now   Works part time as metallurgist   5 children; 1 son deceased   Never Smoked   Alcohol use- no   Caffeine use: occasional       Lost one child a few months ago    Lost his sister, then son and then nephew - all within 2 weeks apart   Son had a stroke at 32    Was planning to get married last month in Feb.    Social Drivers of Health   Tobacco Use: Low Risk (02/01/2024)   Patient History    Smoking Tobacco Use: Never    Smokeless Tobacco Use: Never    Passive Exposure: Not on file  Financial Resource Strain: Low Risk (02/21/2022)   Overall Financial Resource Strain (CARDIA)    Difficulty of Paying Living Expenses: Not hard at all  Food Insecurity: No Food Insecurity (02/21/2022)   Hunger Vital Sign    Worried About Running Out of Food in the Last Year: Never true    Ran Out of Food in the Last Year: Never true  Transportation Needs: No Transportation Needs (02/21/2022)   PRAPARE - Administrator, Civil Service (Medical): No    Lack of Transportation (Non-Medical): No  Physical Activity: Insufficiently Active (02/21/2022)   Exercise Vital Sign    Days of Exercise per Week: 3 days    Minutes of Exercise per Session: 30 min  Stress: No Stress Concern Present (02/21/2022)   [employer] of Occupational Health - Occupational Stress Questionnaire    Feeling of Stress : Not at all  Social Connections: Moderately Integrated (02/21/2022)   Social Connection and Isolation Panel    Frequency of Communication with Friends and Family: More  than three times a week    Frequency of Social Gatherings with Friends and Family: More than three times a week    Attends Religious Services: More than 4 times per year    Active Member of [employer] or Organizations: Yes    Attends Banker Meetings: More than 4 times per year    Marital Status: Widowed  Intimate Partner Violence: Not At Risk (02/21/2022)   Humiliation, Afraid, Rape, and Kick questionnaire    Fear of Current or Ex-Partner: No    Emotionally Abused: No    Physically  Abused: No    Sexually Abused: No  Depression (PHQ2-9): Low Risk (11/01/2023)   Depression (PHQ2-9)    PHQ-2 Score: 0  Alcohol Screen: Low Risk (02/21/2022)   Alcohol Screen    Last Alcohol Screening Score (AUDIT): 0  Housing: Low Risk (02/21/2022)   Housing    Last Housing Risk Score: 0  Utilities: Not At Risk (02/21/2022)   AHC Utilities    Threatened with loss of utilities: No  Health Literacy: Not on file    Past Surgical History:  Procedure Laterality Date   AORTIC VALVE REPLACEMENT N/A 10/29/2015   Procedure: AORTIC VALVE REPLACEMENT (AVR) WITH MAGNA EASE TISSUE VALVE.;  Surgeon: Dallas KATHEE Jude, MD;  Location: [facility] OR;  Service: Open Heart Surgery;  Laterality: N/A;   CARDIAC CATHETERIZATION N/A 10/27/2015   Procedure: Left Heart Cath and Coronary Angiography;  Surgeon: Alm LELON Clay, MD;  Location: [facility] INVASIVE CV LAB;  Service: Cardiovascular;  Laterality: N/A;   COLONOSCOPY  09/02/2015   Dr.Pyrtle   CORONARY ARTERY BYPASS GRAFT N/A 10/29/2015   Procedure: CORONARY ARTERY BYPASS GRAFTING (CABG) x Four UTILIZING THE LEFT INTERNAL MAMMARY ARTERY AND ENDOSCOPICALLY HARVESTED RIGHT SAPEHENEOUS VEINS.;  Surgeon: Dallas KATHEE Jude, MD;  [facility];  Service: Open Heart Surgery;  Laterality: N/A;   CYST REMOVAL HAND Right    ESOPHAGOGASTRODUODENOSCOPY N/A 12/02/2015   Procedure: ESOPHAGOGASTRODUODENOSCOPY (EGD);  Surgeon: Elspeth Deward Naval, MD;  [facility]: [facility] ENDOSCOPY;  Service:  Gastroenterology;  Laterality: N/A;   REPLACEMENT ASCENDING AORTA N/A 10/29/2015   Procedure: REPLACEMENT OF ASCENDING AORTA USING X 30CM WOVEN DOUBLE VELOUR VASCULAR GRAFT.;  Surgeon: Dallas KATHEE Jude, MD;  Location: [facility] OR;  Service: Open Heart Surgery;  Laterality: N/A;   TEE WITHOUT CARDIOVERSION N/A 10/29/2015   Procedure: TRANSESOPHAGEAL ECHOCARDIOGRAM (TEE);  Surgeon: Dallas KATHEE Jude, MD;  Location: [facility] OR;  Service: Open Heart Surgery;  Laterality: N/A;   UVULECTOMY N/A 12/10/2015   Procedure: UVULECTOMY;  Surgeon: Marlyce Finer, MD;  Location[facility];  Service: ENT;  Laterality: N/A;    Family History  Problem Relation Age of Onset   Cancer Father        Throat cancer   Diabetes Sister    Colon cancer Neg Hx     Allergies[1]  Medications Ordered Prior to Encounter[2]  BP 130/80   Pulse 67   Temp 98 F (36.7 C) (Oral)   Ht 5' 10.25 (1.784 m)   Wt 182 lb (82.6 kg)   SpO2 93%   BMI 25.93 kg/m       Objective:   Physical Exam Vitals and nursing note reviewed.  Constitutional:      General: He is not in acute distress.    Appearance: Normal appearance. He is not ill-appearing.  HENT:     Head: Normocephalic and atraumatic.     Right Ear: Tympanic membrane, ear canal and external ear normal. There is no impacted cerumen.     Left Ear: Tympanic membrane, ear canal and external ear normal. There is no impacted cerumen.     Nose: Nose normal. No congestion or rhinorrhea.     Mouth/Throat:     Mouth: Mucous membranes are moist.     Pharynx: Oropharynx is clear.  Eyes:     Extraocular Movements: Extraocular movements intact.     Conjunctiva/sclera: Conjunctivae normal.     Pupils: Pupils are equal, round, and reactive to light.  Neck:     Vascular: No carotid bruit.  Cardiovascular:  Rate and Rhythm: Normal rate and regular rhythm.     Pulses: Normal pulses.     Heart sounds: No murmur heard.    No friction rub. No gallop.  Pulmonary:     Effort:  Pulmonary effort is normal.     Breath sounds: Normal breath sounds.  Abdominal:     General: Abdomen is flat. Bowel sounds are normal. There is no distension.     Palpations: Abdomen is soft. There is no mass.     Tenderness: There is no abdominal tenderness. There is no guarding or rebound.     Hernia: No hernia is present.  Musculoskeletal:     Cervical back: Normal range of motion and neck supple.     Right knee: Swelling (trace swelling) and bony tenderness present. No erythema or crepitus. Normal range of motion. Tenderness present. No medial joint line or patellar tendon tenderness. No LCL laxity, MCL laxity, ACL laxity or PCL laxity. Normal meniscus. Normal pulse.     Instability Tests: Anterior drawer test negative. Posterior drawer test negative. Medial McMurray test negative and lateral McMurray test negative.     Right lower leg: No swelling. No edema.       Legs:     Comments: No Bakers cysts noted   Lymphadenopathy:     Cervical: No cervical adenopathy.  Skin:    General: Skin is warm and dry.     Capillary Refill: Capillary refill takes less than 2 seconds.  Neurological:     General: No focal deficit present.     Mental Status: He is alert and oriented to person, place, and time.  Psychiatric:        Mood and Affect: Mood normal.        Behavior: Behavior normal.        Thought Content: Thought content normal.        Judgment: Judgment normal.       Assessment & Plan:  1. Routine general medical examination at a health care facility (Primary) Today patient counseled on age appropriate routine health concerns for screening and prevention, each reviewed and up to date or declined. Immunizations reviewed and up to date or declined. Labs ordered and reviewed. Risk factors for depression reviewed and negative. Hearing function and visual acuity are intact. ADLs screened and addressed as needed. Functional ability and level of safety reviewed and appropriate. Education,  counseling and referrals performed based on assessed risks today. Patient provided with a copy of personalized plan for preventive services. - Follow up in one year or sooner if needed - Eat healthy and stay active   2. Type 2 diabetes mellitus with other specified complication, with long-term current use of insulin  (HCC) - Consider increase in insulin   - Likely 3 month follow up  - Lipid panel; Future - TSH; Future - CBC; Future - Comprehensive metabolic panel with GFR; Future - Hemoglobin A1c; Future - Microalbumin/Creatinine Ratio, Urine; Future  3. Diabetes mellitus treated with oral medication (HCC) - Consider increase in Jardiance  - Lipid panel; Future - TSH; Future - CBC; Future - Comprehensive metabolic panel with GFR; Future - Hemoglobin A1c; Future - Microalbumin/Creatinine Ratio, Urine; Future  4. Essential hypertension - well controlled. No change in medication  - Lipid panel; Future - TSH; Future - CBC; Future - Comprehensive metabolic panel with GFR; Future  5. Coronary artery disease involving coronary bypass graft of native heart without angina pectoris - Continue with Repatha  and Asa  - Lipid panel; Future -  TSH; Future - CBC; Future - Comprehensive metabolic panel with GFR; Future  6. Mixed hyperlipidemia - Continue with Repatha   - Lipid panel; Future - TSH; Future - CBC; Future - Comprehensive metabolic panel with GFR; Future  7. Statin myopathy  - Lipid panel; Future - TSH; Future - CBC; Future - Comprehensive metabolic panel with GFR; Future  8. Benign prostatic hyperplasia with nocturia  - PSA; Future  9. Need for influenza vaccination  - Flu vaccine HIGH DOSE PF(Fluzone Trivalent)  10. Chronic pain of right knee - Will do xray today and consider steroid injection or referral to orthopedics  - DG Knee 1-2 Views Right; Future  Darleene Shape, NP     [1]  Allergies Allergen Reactions   Amiodarone  Nausea And Vomiting    Quinolones     Patient was warned about not using Cipro  and similar antibiotics. Recent studies have raised concern that fluoroquinolone antibiotics could be associated with an increased risk of aortic aneurysm Fluoroquinolones have non-antimicrobial properties that might jeopardise the integrity of the extracellular matrix of the vascular wall In a  propensity score matched cohort study in [place], there was a 66% increased rate of aortic aneurysm or dissection associated with oral fluoroquinolone use, compared wit  [2]  Current Outpatient Medications on File Prior to Visit  Medication Sig Dispense Refill   Accu-Chek Softclix Lancets lancets Use as instructed 100 each 12   aspirin  EC 81 MG tablet Take 81 mg by mouth daily.     Continuous Glucose Sensor (FREESTYLE LIBRE 3 PLUS SENSOR) MISC USE AS DIRECTED AND CHANGE SENSOR EVERY 15 DAYS. 6 each 3   empagliflozin  (JARDIANCE ) 10 MG TABS tablet Take 1 tablet (10 mg total) by mouth daily before breakfast. 90 tablet 1   glipiZIDE  (GLUCOTROL  XL) 10 MG 24 hr tablet Take 1 tablet (10 mg total) by mouth in the morning and at bedtime. 180 tablet 1   hydrochlorothiazide  (HYDRODIURIL ) 25 MG tablet Take 1 tablet (25 mg total) by mouth daily. 90 tablet 3   insulin  glargine (LANTUS  SOLOSTAR) 100 UNIT/ML Solostar Pen Inject 24 Units into the skin 2 (two) times daily. 45 mL 1   Insulin  Pen Needle (B-D ULTRAFINE III SHORT PEN) 31G X 8 MM MISC Use BID 200 each 3   metoprolol  tartrate (LOPRESSOR ) 25 MG tablet Take 1 tablet (25 mg total) by mouth 2 (two) times daily. 90 tablet 3   REPATHA  SURECLICK 140 MG/ML SOAJ INJECT 140MG  UNDER THE SKIN EVERY 2 WEEKS 6 mL 3   tamsulosin  (FLOMAX ) 0.4 MG CAPS capsule Take 1 capsule (0.4 mg total) by mouth daily. 90 capsule 3   No current facility-administered medications on file prior to visit.   "

## 2024-02-01 NOTE — Patient Instructions (Addendum)
 It was great seeing you today   We will follow up with you regarding your lab work   Please let me know if you need anything

## 2024-02-05 MED ORDER — EMPAGLIFLOZIN 25 MG PO TABS: 25.0000 mg | ORAL_TABLET | Freq: Every day | ORAL | 3 refills | Status: AC

## 2024-02-16 ENCOUNTER — Encounter: Payer: Self-pay | Admitting: Adult Health

## 2024-02-19 NOTE — Telephone Encounter (Signed)
 Please advise

## 2024-02-26 ENCOUNTER — Other Ambulatory Visit: Payer: Self-pay | Admitting: Adult Health

## 2024-02-26 DIAGNOSIS — E1122 Type 2 diabetes mellitus with diabetic chronic kidney disease: Secondary | ICD-10-CM

## 2024-02-28 ENCOUNTER — Other Ambulatory Visit: Payer: Self-pay | Admitting: Adult Health

## 2024-02-28 DIAGNOSIS — E1169 Type 2 diabetes mellitus with other specified complication: Secondary | ICD-10-CM

## 2024-02-28 DIAGNOSIS — I10 Essential (primary) hypertension: Secondary | ICD-10-CM

## 2024-07-09 ENCOUNTER — Ambulatory Visit: Admitting: "Endocrinology
